# Patient Record
Sex: Female | Born: 1940 | Race: White | Hispanic: No | Smoking: Never smoker
Health system: Southern US, Community
[De-identification: ages and names within clinical notes are randomized; demographics above are authoritative.]

## PROBLEM LIST (undated history)

## (undated) DIAGNOSIS — D649 Anemia, unspecified: Secondary | ICD-10-CM

## (undated) DIAGNOSIS — Z8489 Family history of other specified conditions: Secondary | ICD-10-CM

## (undated) DIAGNOSIS — C9000 Multiple myeloma not having achieved remission: Secondary | ICD-10-CM

## (undated) DIAGNOSIS — R42 Dizziness and giddiness: Secondary | ICD-10-CM

## (undated) DIAGNOSIS — K2270 Barrett's esophagus without dysplasia: Secondary | ICD-10-CM

## (undated) DIAGNOSIS — T7840XA Allergy, unspecified, initial encounter: Secondary | ICD-10-CM

## (undated) DIAGNOSIS — M545 Low back pain, unspecified: Secondary | ICD-10-CM

## (undated) DIAGNOSIS — J309 Allergic rhinitis, unspecified: Secondary | ICD-10-CM

## (undated) DIAGNOSIS — K219 Gastro-esophageal reflux disease without esophagitis: Secondary | ICD-10-CM

## (undated) DIAGNOSIS — J302 Other seasonal allergic rhinitis: Secondary | ICD-10-CM

## (undated) DIAGNOSIS — D759 Disease of blood and blood-forming organs, unspecified: Secondary | ICD-10-CM

## (undated) DIAGNOSIS — N289 Disorder of kidney and ureter, unspecified: Secondary | ICD-10-CM

## (undated) DIAGNOSIS — M51369 Other intervertebral disc degeneration, lumbar region without mention of lumbar back pain or lower extremity pain: Secondary | ICD-10-CM

## (undated) DIAGNOSIS — M5416 Radiculopathy, lumbar region: Secondary | ICD-10-CM

## (undated) DIAGNOSIS — E039 Hypothyroidism, unspecified: Secondary | ICD-10-CM

## (undated) DIAGNOSIS — G629 Polyneuropathy, unspecified: Secondary | ICD-10-CM

## (undated) DIAGNOSIS — M8580 Other specified disorders of bone density and structure, unspecified site: Secondary | ICD-10-CM

## (undated) DIAGNOSIS — IMO0002 Reserved for concepts with insufficient information to code with codable children: Secondary | ICD-10-CM

## (undated) DIAGNOSIS — I499 Cardiac arrhythmia, unspecified: Secondary | ICD-10-CM

## (undated) DIAGNOSIS — C801 Malignant (primary) neoplasm, unspecified: Secondary | ICD-10-CM

## (undated) DIAGNOSIS — R194 Change in bowel habit: Secondary | ICD-10-CM

## (undated) DIAGNOSIS — M48062 Spinal stenosis, lumbar region with neurogenic claudication: Secondary | ICD-10-CM

## (undated) DIAGNOSIS — R7611 Nonspecific reaction to tuberculin skin test without active tuberculosis: Secondary | ICD-10-CM

## (undated) DIAGNOSIS — R1319 Other dysphagia: Secondary | ICD-10-CM

## (undated) DIAGNOSIS — M810 Age-related osteoporosis without current pathological fracture: Secondary | ICD-10-CM

## (undated) DIAGNOSIS — R002 Palpitations: Secondary | ICD-10-CM

## (undated) DIAGNOSIS — M109 Gout, unspecified: Secondary | ICD-10-CM

## (undated) DIAGNOSIS — M5136 Other intervertebral disc degeneration, lumbar region: Secondary | ICD-10-CM

## (undated) DIAGNOSIS — Z9481 Bone marrow transplant status: Secondary | ICD-10-CM

## (undated) DIAGNOSIS — M069 Rheumatoid arthritis, unspecified: Secondary | ICD-10-CM

## (undated) DIAGNOSIS — M064 Inflammatory polyarthropathy: Secondary | ICD-10-CM

## (undated) DIAGNOSIS — E785 Hyperlipidemia, unspecified: Secondary | ICD-10-CM

## (undated) HISTORY — DX: Bone marrow transplant status: Z94.81

## (undated) HISTORY — DX: Multiple myeloma not having achieved remission: C90.00

## (undated) HISTORY — DX: Allergy, unspecified, initial encounter: T78.40XA

## (undated) HISTORY — PX: FOOT SURGERY: SHX648

## (undated) HISTORY — DX: Disorder of kidney and ureter, unspecified: N28.9

## (undated) HISTORY — DX: Gout, unspecified: M10.9

## (undated) HISTORY — DX: Other intervertebral disc degeneration, lumbar region: M51.36

## (undated) HISTORY — DX: Palpitations: R00.2

## (undated) HISTORY — DX: Allergic rhinitis, unspecified: J30.9

## (undated) HISTORY — DX: Spinal stenosis, lumbar region with neurogenic claudication: M48.062

## (undated) HISTORY — DX: Other intervertebral disc degeneration, lumbar region without mention of lumbar back pain or lower extremity pain: M51.369

## (undated) HISTORY — PX: CHOLECYSTECTOMY: SHX55

## (undated) HISTORY — DX: Malignant (primary) neoplasm, unspecified: C80.1

## (undated) HISTORY — PX: ABDOMINAL HYSTERECTOMY: SHX81

## (undated) HISTORY — DX: Hyperlipidemia, unspecified: E78.5

## (undated) HISTORY — DX: Rheumatoid arthritis, unspecified: M06.9

## (undated) HISTORY — PX: CATARACT EXTRACTION: SUR2

## (undated) HISTORY — DX: Radiculopathy, lumbar region: M54.16

## (undated) HISTORY — PX: APPENDECTOMY: SHX54

## (undated) HISTORY — PX: BACK SURGERY: SHX140

## (undated) HISTORY — DX: Barrett's esophagus without dysplasia: K22.70

## (undated) HISTORY — PX: BREAST EXCISIONAL BIOPSY: SUR124

## (undated) HISTORY — DX: Age-related osteoporosis without current pathological fracture: M81.0

## (undated) HISTORY — DX: Reserved for concepts with insufficient information to code with codable children: IMO0002

---

## 2001-04-08 ENCOUNTER — Emergency Department (HOSPITAL_COMMUNITY): Admission: EM | Admit: 2001-04-08 | Discharge: 2001-04-09 | Payer: Self-pay | Admitting: Emergency Medicine

## 2001-04-08 ENCOUNTER — Encounter: Payer: Self-pay | Admitting: Emergency Medicine

## 2006-03-15 ENCOUNTER — Ambulatory Visit: Payer: Self-pay | Admitting: Family Medicine

## 2006-07-15 ENCOUNTER — Ambulatory Visit: Payer: Self-pay | Admitting: Unknown Physician Specialty

## 2007-04-11 ENCOUNTER — Ambulatory Visit: Payer: Self-pay | Admitting: Family Medicine

## 2008-06-21 ENCOUNTER — Ambulatory Visit: Payer: Self-pay | Admitting: Family Medicine

## 2009-01-24 ENCOUNTER — Ambulatory Visit: Payer: Self-pay | Admitting: Otolaryngology

## 2009-04-18 ENCOUNTER — Ambulatory Visit: Payer: Self-pay | Admitting: Sports Medicine

## 2009-05-11 DIAGNOSIS — A159 Respiratory tuberculosis unspecified: Secondary | ICD-10-CM

## 2009-05-11 HISTORY — DX: Respiratory tuberculosis unspecified: A15.9

## 2009-06-25 ENCOUNTER — Ambulatory Visit: Payer: Self-pay | Admitting: Family Medicine

## 2009-09-18 ENCOUNTER — Ambulatory Visit: Payer: Self-pay | Admitting: Family Medicine

## 2010-06-11 ENCOUNTER — Emergency Department: Payer: Self-pay | Admitting: Emergency Medicine

## 2010-08-05 ENCOUNTER — Ambulatory Visit: Payer: Self-pay | Admitting: Family Medicine

## 2010-08-10 ENCOUNTER — Ambulatory Visit: Payer: Self-pay | Admitting: Internal Medicine

## 2010-09-09 ENCOUNTER — Ambulatory Visit: Payer: Self-pay | Admitting: Internal Medicine

## 2010-10-10 ENCOUNTER — Ambulatory Visit: Payer: Self-pay | Admitting: Internal Medicine

## 2010-11-09 ENCOUNTER — Ambulatory Visit: Payer: Self-pay | Admitting: Internal Medicine

## 2010-12-10 ENCOUNTER — Ambulatory Visit: Payer: Self-pay | Admitting: Internal Medicine

## 2011-01-10 ENCOUNTER — Ambulatory Visit: Payer: Self-pay | Admitting: Internal Medicine

## 2011-02-09 ENCOUNTER — Ambulatory Visit: Payer: Self-pay | Admitting: Internal Medicine

## 2011-03-12 ENCOUNTER — Ambulatory Visit: Payer: Self-pay | Admitting: Internal Medicine

## 2011-04-11 ENCOUNTER — Ambulatory Visit: Payer: Self-pay | Admitting: Internal Medicine

## 2011-05-12 ENCOUNTER — Ambulatory Visit: Payer: Self-pay | Admitting: Internal Medicine

## 2011-05-12 DIAGNOSIS — IMO0002 Reserved for concepts with insufficient information to code with codable children: Secondary | ICD-10-CM

## 2011-05-12 HISTORY — DX: Reserved for concepts with insufficient information to code with codable children: IMO0002

## 2011-05-18 LAB — CBC CANCER CENTER
Basophil #: 0 x10 3/mm (ref 0.0–0.1)
Basophil %: 0.5 %
Eosinophil #: 0.4 x10 3/mm (ref 0.0–0.7)
Eosinophil %: 7.4 %
HCT: 33.9 % — ABNORMAL LOW (ref 35.0–47.0)
HGB: 11.5 g/dL — ABNORMAL LOW (ref 12.0–16.0)
Lymphocyte #: 1.4 x10 3/mm (ref 1.0–3.6)
Lymphocyte %: 22.6 %
MCH: 32 pg (ref 26.0–34.0)
MCHC: 33.9 g/dL (ref 32.0–36.0)
MCV: 94 fL (ref 80–100)
Monocyte #: 0.8 x10 3/mm — ABNORMAL HIGH (ref 0.0–0.7)
Monocyte %: 13.7 %
Neutrophil #: 3.4 x10 3/mm (ref 1.4–6.5)
Neutrophil %: 55.8 %
Platelet: 183 x10 3/mm (ref 150–440)
RBC: 3.59 10*6/uL — ABNORMAL LOW (ref 3.80–5.20)
RDW: 14.3 % (ref 11.5–14.5)
WBC: 6 x10 3/mm (ref 3.6–11.0)

## 2011-05-18 LAB — CREATININE, SERUM
Creatinine: 1.43 mg/dL — ABNORMAL HIGH (ref 0.60–1.30)
EGFR (African American): 47 — ABNORMAL LOW
EGFR (Non-African Amer.): 38 — ABNORMAL LOW

## 2011-05-25 LAB — CBC CANCER CENTER
Basophil #: 0 x10 3/mm (ref 0.0–0.1)
Basophil %: 1 %
Eosinophil #: 0.2 x10 3/mm (ref 0.0–0.7)
Eosinophil %: 4.8 %
HCT: 33.9 % — ABNORMAL LOW (ref 35.0–47.0)
HGB: 11.6 g/dL — ABNORMAL LOW (ref 12.0–16.0)
Lymphocyte #: 1.1 x10 3/mm (ref 1.0–3.6)
Lymphocyte %: 26.1 %
MCH: 32 pg (ref 26.0–34.0)
MCHC: 34.3 g/dL (ref 32.0–36.0)
MCV: 94 fL (ref 80–100)
Monocyte #: 0.6 x10 3/mm (ref 0.0–0.7)
Monocyte %: 14 %
Neutrophil #: 2.4 x10 3/mm (ref 1.4–6.5)
Neutrophil %: 54.1 %
Platelet: 220 x10 3/mm (ref 150–440)
RBC: 3.62 10*6/uL — ABNORMAL LOW (ref 3.80–5.20)
RDW: 14.2 % (ref 11.5–14.5)
WBC: 4.4 x10 3/mm (ref 3.6–11.0)

## 2011-06-08 LAB — CBC CANCER CENTER
Basophil #: 0.1 x10 3/mm (ref 0.0–0.1)
Basophil %: 1 %
Eosinophil #: 0.2 x10 3/mm (ref 0.0–0.7)
Eosinophil %: 3.8 %
HCT: 37 % (ref 35.0–47.0)
HGB: 12.7 g/dL (ref 12.0–16.0)
Lymphocyte #: 1.6 x10 3/mm (ref 1.0–3.6)
Lymphocyte %: 26.9 %
MCH: 32 pg (ref 26.0–34.0)
MCHC: 34.3 g/dL (ref 32.0–36.0)
MCV: 93 fL (ref 80–100)
Monocyte #: 0.5 x10 3/mm (ref 0.0–0.7)
Monocyte %: 8 %
Neutrophil #: 3.5 x10 3/mm (ref 1.4–6.5)
Neutrophil %: 60.3 %
Platelet: 221 x10 3/mm (ref 150–440)
RBC: 3.96 10*6/uL (ref 3.80–5.20)
RDW: 14.4 % (ref 11.5–14.5)
WBC: 5.8 x10 3/mm (ref 3.6–11.0)

## 2011-06-08 LAB — CREATININE, SERUM
Creatinine: 1.43 mg/dL — ABNORMAL HIGH (ref 0.60–1.30)
EGFR (African American): 47 — ABNORMAL LOW
EGFR (Non-African Amer.): 38 — ABNORMAL LOW

## 2011-06-08 LAB — HEPATIC FUNCTION PANEL A (ARMC)
Albumin: 3.6 g/dL (ref 3.4–5.0)
Alkaline Phosphatase: 90 U/L (ref 50–136)
Bilirubin, Direct: 0.1 mg/dL (ref 0.00–0.20)
Bilirubin,Total: 0.4 mg/dL (ref 0.2–1.0)
SGOT(AST): 16 U/L (ref 15–37)
SGPT (ALT): 14 U/L
Total Protein: 7.1 g/dL (ref 6.4–8.2)

## 2011-06-12 ENCOUNTER — Ambulatory Visit: Payer: Self-pay | Admitting: Internal Medicine

## 2011-06-22 LAB — CBC CANCER CENTER
Basophil #: 0 x10 3/mm (ref 0.0–0.1)
Basophil %: 0.9 %
Eosinophil #: 0.3 x10 3/mm (ref 0.0–0.7)
Eosinophil %: 5.8 %
HCT: 37.5 % (ref 35.0–47.0)
HGB: 12.7 g/dL (ref 12.0–16.0)
Lymphocyte #: 1.4 x10 3/mm (ref 1.0–3.6)
Lymphocyte %: 28.5 %
MCH: 31.6 pg (ref 26.0–34.0)
MCHC: 33.8 g/dL (ref 32.0–36.0)
MCV: 93 fL (ref 80–100)
Monocyte #: 0.7 x10 3/mm (ref 0.0–0.7)
Monocyte %: 14.8 %
Neutrophil #: 2.4 x10 3/mm (ref 1.4–6.5)
Neutrophil %: 50 %
Platelet: 179 x10 3/mm (ref 150–440)
RBC: 4.02 10*6/uL (ref 3.80–5.20)
RDW: 14.1 % (ref 11.5–14.5)
WBC: 4.8 x10 3/mm (ref 3.6–11.0)

## 2011-06-22 LAB — CREATININE, SERUM
Creatinine: 1.44 mg/dL — ABNORMAL HIGH (ref 0.60–1.30)
EGFR (African American): 46 — ABNORMAL LOW
EGFR (Non-African Amer.): 38 — ABNORMAL LOW

## 2011-06-23 LAB — URINE IEP, RANDOM

## 2011-06-24 LAB — KAPPA/LAMBDA FREE LIGHT CHAINS (ARMC)

## 2011-06-24 LAB — PROT IMMUNOELECTROPHORES(ARMC)

## 2011-07-06 LAB — CBC CANCER CENTER
Basophil #: 0 x10 3/mm (ref 0.0–0.1)
Basophil %: 0.5 %
Eosinophil #: 0.2 x10 3/mm (ref 0.0–0.7)
Eosinophil %: 2 %
HCT: 40.7 % (ref 35.0–47.0)
HGB: 13.6 g/dL (ref 12.0–16.0)
Lymphocyte #: 1.7 x10 3/mm (ref 1.0–3.6)
Lymphocyte %: 20.4 %
MCH: 31.3 pg (ref 26.0–34.0)
MCHC: 33.4 g/dL (ref 32.0–36.0)
MCV: 94 fL (ref 80–100)
Monocyte #: 0.6 x10 3/mm (ref 0.0–0.7)
Monocyte %: 7.1 %
Neutrophil #: 5.7 x10 3/mm (ref 1.4–6.5)
Neutrophil %: 70 %
Platelet: 207 x10 3/mm (ref 150–440)
RBC: 4.35 10*6/uL (ref 3.80–5.20)
RDW: 13.9 % (ref 11.5–14.5)
WBC: 8.1 x10 3/mm (ref 3.6–11.0)

## 2011-07-10 ENCOUNTER — Ambulatory Visit: Payer: Self-pay | Admitting: Internal Medicine

## 2011-07-27 LAB — CBC CANCER CENTER
Basophil #: 0 x10 3/mm (ref 0.0–0.1)
Basophil %: 0.7 %
Eosinophil #: 0 x10 3/mm (ref 0.0–0.7)
Eosinophil %: 0.5 %
HCT: 37.6 % (ref 35.0–47.0)
HGB: 12.7 g/dL (ref 12.0–16.0)
Lymphocyte #: 1.3 x10 3/mm (ref 1.0–3.6)
Lymphocyte %: 24.9 %
MCH: 31.2 pg (ref 26.0–34.0)
MCHC: 33.7 g/dL (ref 32.0–36.0)
MCV: 93 fL (ref 80–100)
Monocyte #: 0.5 x10 3/mm (ref 0.0–0.7)
Monocyte %: 9.4 %
Neutrophil #: 3.4 x10 3/mm (ref 1.4–6.5)
Neutrophil %: 64.5 %
Platelet: 216 x10 3/mm (ref 150–440)
RBC: 4.07 10*6/uL (ref 3.80–5.20)
RDW: 14.4 % (ref 11.5–14.5)
WBC: 5.2 x10 3/mm (ref 3.6–11.0)

## 2011-07-27 LAB — CREATININE, SERUM
Creatinine: 1.25 mg/dL
EGFR (African American): 54 — ABNORMAL LOW
EGFR (Non-African Amer.): 45 — ABNORMAL LOW

## 2011-08-10 ENCOUNTER — Ambulatory Visit: Payer: Self-pay | Admitting: Internal Medicine

## 2011-08-19 LAB — CBC CANCER CENTER
Bands: 3 %
Basophil #: 0 x10 3/mm (ref 0.0–0.1)
Basophil %: 0.4 %
Basophil: 1 %
Eosinophil #: 0.1 x10 3/mm (ref 0.0–0.7)
Eosinophil %: 0.9 %
Eosinophil: 1 %
HCT: 37.6 % (ref 35.0–47.0)
HGB: 12.7 g/dL (ref 12.0–16.0)
Lymphocyte #: 1.3 x10 3/mm (ref 1.0–3.6)
Lymphocyte %: 21.4 %
Lymphocytes: 18 %
MCH: 31.6 pg (ref 26.0–34.0)
MCHC: 33.9 g/dL (ref 32.0–36.0)
MCV: 93 fL (ref 80–100)
Monocyte #: 0.5 x10 3/mm (ref 0.2–0.9)
Monocyte %: 8.1 %
Monocytes: 9 %
Neutrophil #: 4.2 x10 3/mm (ref 1.4–6.5)
Neutrophil %: 69.2 %
Platelet: 178 x10 3/mm (ref 150–440)
RBC: 4.04 10*6/uL (ref 3.80–5.20)
RDW: 14.9 % — ABNORMAL HIGH (ref 11.5–14.5)
Segmented Neutrophils: 68 %
WBC: 6.1 x10 3/mm (ref 3.6–11.0)

## 2011-08-19 LAB — CREATININE, SERUM
Creatinine: 1.5 mg/dL — ABNORMAL HIGH (ref 0.60–1.30)
EGFR (African American): 44 — ABNORMAL LOW
EGFR (Non-African Amer.): 36 — ABNORMAL LOW

## 2011-08-20 LAB — PROT IMMUNOELECTROPHORES(ARMC)

## 2011-09-09 ENCOUNTER — Ambulatory Visit: Payer: Self-pay | Admitting: Internal Medicine

## 2011-09-23 DIAGNOSIS — T8600 Unspecified complication of bone marrow transplant: Secondary | ICD-10-CM | POA: Insufficient documentation

## 2011-09-23 DIAGNOSIS — M069 Rheumatoid arthritis, unspecified: Secondary | ICD-10-CM | POA: Insufficient documentation

## 2011-10-13 DIAGNOSIS — K2270 Barrett's esophagus without dysplasia: Secondary | ICD-10-CM | POA: Insufficient documentation

## 2011-10-13 DIAGNOSIS — M810 Age-related osteoporosis without current pathological fracture: Secondary | ICD-10-CM | POA: Insufficient documentation

## 2011-11-18 ENCOUNTER — Ambulatory Visit: Payer: Self-pay | Admitting: Internal Medicine

## 2011-11-18 LAB — CBC CANCER CENTER
Basophil #: 0.1 x10 3/mm (ref 0.0–0.1)
Basophil %: 1 %
Eosinophil #: 0 x10 3/mm (ref 0.0–0.7)
Eosinophil %: 0.2 %
HCT: 31.3 % — ABNORMAL LOW (ref 35.0–47.0)
HGB: 10.2 g/dL — ABNORMAL LOW (ref 12.0–16.0)
Lymphocyte #: 1.7 x10 3/mm (ref 1.0–3.6)
Lymphocyte %: 22 %
MCH: 31.9 pg (ref 26.0–34.0)
MCHC: 32.5 g/dL (ref 32.0–36.0)
MCV: 98 fL (ref 80–100)
Monocyte #: 0.8 x10 3/mm (ref 0.2–0.9)
Monocyte %: 9.7 %
Neutrophil #: 5.3 x10 3/mm (ref 1.4–6.5)
Neutrophil %: 67.1 %
Platelet: 175 x10 3/mm (ref 150–440)
RBC: 3.18 10*6/uL — ABNORMAL LOW (ref 3.80–5.20)
RDW: 17.9 % — ABNORMAL HIGH (ref 11.5–14.5)
WBC: 7.9 x10 3/mm (ref 3.6–11.0)

## 2011-11-18 LAB — CREATININE, SERUM
Creatinine: 1.18 mg/dL (ref 0.60–1.30)
EGFR (African American): 54 — ABNORMAL LOW
EGFR (Non-African Amer.): 46 — ABNORMAL LOW

## 2011-12-10 ENCOUNTER — Ambulatory Visit: Payer: Self-pay | Admitting: Internal Medicine

## 2011-12-16 LAB — CBC CANCER CENTER
Basophil #: 0.1 x10 3/mm (ref 0.0–0.1)
Basophil %: 0.8 %
Eosinophil #: 0 x10 3/mm (ref 0.0–0.7)
Eosinophil %: 0.5 %
HCT: 35 % (ref 35.0–47.0)
HGB: 11.8 g/dL — ABNORMAL LOW (ref 12.0–16.0)
Lymphocyte #: 1.3 x10 3/mm (ref 1.0–3.6)
Lymphocyte %: 18.5 %
MCH: 33.6 pg (ref 26.0–34.0)
MCHC: 33.7 g/dL (ref 32.0–36.0)
MCV: 100 fL (ref 80–100)
Monocyte #: 0.5 x10 3/mm (ref 0.2–0.9)
Monocyte %: 7.2 %
Neutrophil #: 5.3 x10 3/mm (ref 1.4–6.5)
Neutrophil %: 73 %
Platelet: 220 x10 3/mm (ref 150–440)
RBC: 3.51 10*6/uL — ABNORMAL LOW (ref 3.80–5.20)
RDW: 15.1 % — ABNORMAL HIGH (ref 11.5–14.5)
WBC: 7.2 x10 3/mm (ref 3.6–11.0)

## 2011-12-16 LAB — CREATININE, SERUM
Creatinine: 1.66 mg/dL — ABNORMAL HIGH (ref 0.60–1.30)
EGFR (African American): 36 — ABNORMAL LOW
EGFR (Non-African Amer.): 31 — ABNORMAL LOW

## 2011-12-16 LAB — CALCIUM: Calcium, Total: 8.8 mg/dL (ref 8.5–10.1)

## 2011-12-16 LAB — URIC ACID: Uric Acid: 7 mg/dL — ABNORMAL HIGH (ref 2.6–6.0)

## 2011-12-16 LAB — POTASSIUM: Potassium: 3.9 mmol/L (ref 3.5–5.1)

## 2011-12-18 LAB — KAPPA/LAMBDA FREE LIGHT CHAINS (ARMC)

## 2011-12-18 LAB — PROT IMMUNOELECTROPHORES(ARMC)

## 2011-12-23 LAB — CREATININE, SERUM
Creatinine: 1.65 mg/dL — ABNORMAL HIGH (ref 0.60–1.30)
EGFR (African American): 36 — ABNORMAL LOW
EGFR (Non-African Amer.): 31 — ABNORMAL LOW

## 2012-01-10 ENCOUNTER — Ambulatory Visit: Payer: Self-pay | Admitting: Internal Medicine

## 2012-01-13 LAB — CBC CANCER CENTER
Basophil #: 0 x10 3/mm (ref 0.0–0.1)
Basophil %: 0.5 %
Eosinophil #: 0 x10 3/mm (ref 0.0–0.7)
Eosinophil %: 0.4 %
HCT: 37.2 % (ref 35.0–47.0)
HGB: 12.2 g/dL (ref 12.0–16.0)
Lymphocyte #: 1.3 x10 3/mm (ref 1.0–3.6)
Lymphocyte %: 18.3 %
MCH: 32.5 pg (ref 26.0–34.0)
MCHC: 32.8 g/dL (ref 32.0–36.0)
MCV: 99 fL (ref 80–100)
Monocyte #: 0.5 x10 3/mm (ref 0.2–0.9)
Monocyte %: 7.6 %
Neutrophil #: 5.2 x10 3/mm (ref 1.4–6.5)
Neutrophil %: 73.2 %
Platelet: 185 x10 3/mm (ref 150–440)
RBC: 3.76 10*6/uL — ABNORMAL LOW (ref 3.80–5.20)
RDW: 13.3 % (ref 11.5–14.5)
WBC: 7.1 x10 3/mm (ref 3.6–11.0)

## 2012-01-13 LAB — CREATININE, SERUM
Creatinine: 1.47 mg/dL — ABNORMAL HIGH (ref 0.60–1.30)
EGFR (African American): 41 — ABNORMAL LOW
EGFR (Non-African Amer.): 36 — ABNORMAL LOW

## 2012-02-03 LAB — CBC CANCER CENTER
Basophil #: 0 x10 3/mm (ref 0.0–0.1)
Basophil %: 0.7 %
Eosinophil #: 0 x10 3/mm (ref 0.0–0.7)
Eosinophil %: 0.6 %
HCT: 37.9 % (ref 35.0–47.0)
HGB: 12.5 g/dL (ref 12.0–16.0)
Lymphocyte #: 1.3 x10 3/mm (ref 1.0–3.6)
Lymphocyte %: 19.4 %
MCH: 31.9 pg (ref 26.0–34.0)
MCHC: 33.1 g/dL (ref 32.0–36.0)
MCV: 96 fL (ref 80–100)
Monocyte #: 0.6 x10 3/mm (ref 0.2–0.9)
Monocyte %: 9.3 %
Neutrophil #: 4.6 x10 3/mm (ref 1.4–6.5)
Neutrophil %: 70 %
Platelet: 188 x10 3/mm (ref 150–440)
RBC: 3.93 10*6/uL (ref 3.80–5.20)
RDW: 13 % (ref 11.5–14.5)
WBC: 6.6 x10 3/mm (ref 3.6–11.0)

## 2012-02-03 LAB — CREATININE, SERUM
Creatinine: 1.56 mg/dL — ABNORMAL HIGH (ref 0.60–1.30)
EGFR (African American): 38 — ABNORMAL LOW
EGFR (Non-African Amer.): 33 — ABNORMAL LOW

## 2012-02-03 LAB — URIC ACID: Uric Acid: 9.2 mg/dL — ABNORMAL HIGH (ref 2.6–6.0)

## 2012-02-09 ENCOUNTER — Ambulatory Visit: Payer: Self-pay | Admitting: Internal Medicine

## 2012-02-09 ENCOUNTER — Ambulatory Visit: Payer: Self-pay

## 2012-03-02 ENCOUNTER — Ambulatory Visit: Payer: Self-pay | Admitting: Family Medicine

## 2012-03-21 ENCOUNTER — Ambulatory Visit: Payer: Self-pay | Admitting: Unknown Physician Specialty

## 2012-03-23 LAB — PATHOLOGY REPORT

## 2012-04-11 ENCOUNTER — Ambulatory Visit: Payer: Self-pay | Admitting: Internal Medicine

## 2012-04-11 LAB — CBC CANCER CENTER
Basophil #: 0 x10 3/mm (ref 0.0–0.1)
Basophil %: 0.8 %
Eosinophil #: 0.1 x10 3/mm (ref 0.0–0.7)
Eosinophil %: 1.4 %
HCT: 34.3 % — ABNORMAL LOW (ref 35.0–47.0)
HGB: 11.9 g/dL — ABNORMAL LOW (ref 12.0–16.0)
Lymphocyte #: 1.4 x10 3/mm (ref 1.0–3.6)
Lymphocyte %: 24.6 %
MCH: 32.6 pg (ref 26.0–34.0)
MCHC: 34.5 g/dL (ref 32.0–36.0)
MCV: 94 fL (ref 80–100)
Monocyte #: 0.5 x10 3/mm (ref 0.2–0.9)
Monocyte %: 8.4 %
Neutrophil #: 3.8 x10 3/mm (ref 1.4–6.5)
Neutrophil %: 64.8 %
Platelet: 192 x10 3/mm (ref 150–440)
RBC: 3.64 10*6/uL — ABNORMAL LOW (ref 3.80–5.20)
RDW: 13.3 % (ref 11.5–14.5)
WBC: 5.8 x10 3/mm (ref 3.6–11.0)

## 2012-04-11 LAB — CREATININE, SERUM
Creatinine: 1.61 mg/dL — ABNORMAL HIGH (ref 0.60–1.30)
EGFR (African American): 37 — ABNORMAL LOW
EGFR (Non-African Amer.): 32 — ABNORMAL LOW

## 2012-04-11 LAB — CALCIUM: Calcium, Total: 9.3 mg/dL (ref 8.5–10.1)

## 2012-04-12 LAB — KAPPA/LAMBDA FREE LIGHT CHAINS (ARMC)

## 2012-04-12 LAB — PROT IMMUNOELECTROPHORES(ARMC)

## 2012-05-09 LAB — CBC CANCER CENTER
Basophil #: 0.1 x10 3/mm (ref 0.0–0.1)
Basophil %: 0.4 %
Eosinophil #: 0 x10 3/mm (ref 0.0–0.7)
Eosinophil %: 0.1 %
HCT: 35 % (ref 35.0–47.0)
HGB: 12 g/dL (ref 12.0–16.0)
Lymphocyte #: 0.7 x10 3/mm — ABNORMAL LOW (ref 1.0–3.6)
Lymphocyte %: 5.4 %
MCH: 32.2 pg (ref 26.0–34.0)
MCHC: 34.2 g/dL (ref 32.0–36.0)
MCV: 94 fL (ref 80–100)
Monocyte #: 0.6 x10 3/mm (ref 0.2–0.9)
Monocyte %: 4.4 %
Neutrophil #: 12.4 x10 3/mm — ABNORMAL HIGH (ref 1.4–6.5)
Neutrophil %: 89.7 %
Platelet: 201 x10 3/mm (ref 150–440)
RBC: 3.72 10*6/uL — ABNORMAL LOW (ref 3.80–5.20)
RDW: 12.2 % (ref 11.5–14.5)
WBC: 13.9 x10 3/mm — ABNORMAL HIGH (ref 3.6–11.0)

## 2012-05-09 LAB — CREATININE, SERUM
Creatinine: 1.74 mg/dL — ABNORMAL HIGH (ref 0.60–1.30)
EGFR (African American): 34 — ABNORMAL LOW
EGFR (Non-African Amer.): 29 — ABNORMAL LOW

## 2012-05-09 LAB — URIC ACID: Uric Acid: 8.9 mg/dL — ABNORMAL HIGH (ref 2.6–6.0)

## 2012-05-09 LAB — CALCIUM: Calcium, Total: 9.3 mg/dL (ref 8.5–10.1)

## 2012-05-10 LAB — PROT IMMUNOELECTROPHORES(ARMC)

## 2012-05-11 ENCOUNTER — Ambulatory Visit: Payer: Self-pay | Admitting: Internal Medicine

## 2012-06-11 ENCOUNTER — Ambulatory Visit: Payer: Self-pay | Admitting: Internal Medicine

## 2012-07-04 LAB — CREATININE, SERUM
Creatinine: 1.84 mg/dL — ABNORMAL HIGH (ref 0.60–1.30)
EGFR (African American): 31 — ABNORMAL LOW
EGFR (Non-African Amer.): 27 — ABNORMAL LOW

## 2012-07-04 LAB — CBC CANCER CENTER
Basophil #: 0.1 x10 3/mm (ref 0.0–0.1)
Basophil %: 0.9 %
Eosinophil #: 0.1 x10 3/mm (ref 0.0–0.7)
Eosinophil %: 1.6 %
HCT: 38 % (ref 35.0–47.0)
HGB: 13 g/dL (ref 12.0–16.0)
Lymphocyte #: 1.5 x10 3/mm (ref 1.0–3.6)
Lymphocyte %: 26.4 %
MCH: 32.4 pg (ref 26.0–34.0)
MCHC: 34.3 g/dL (ref 32.0–36.0)
MCV: 95 fL (ref 80–100)
Monocyte #: 0.6 x10 3/mm (ref 0.2–0.9)
Monocyte %: 10.1 %
Neutrophil #: 3.4 x10 3/mm (ref 1.4–6.5)
Neutrophil %: 61 %
Platelet: 182 x10 3/mm (ref 150–440)
RBC: 4.02 10*6/uL (ref 3.80–5.20)
RDW: 13.5 % (ref 11.5–14.5)
WBC: 5.6 x10 3/mm (ref 3.6–11.0)

## 2012-07-04 LAB — CALCIUM: Calcium, Total: 9.3 mg/dL (ref 8.5–10.1)

## 2012-07-04 LAB — URIC ACID: Uric Acid: 9.5 mg/dL — ABNORMAL HIGH (ref 2.6–6.0)

## 2012-07-05 LAB — PROT IMMUNOELECTROPHORES(ARMC)

## 2012-07-07 LAB — BASIC METABOLIC PANEL
Anion Gap: 8 (ref 7–16)
BUN: 24 mg/dL — ABNORMAL HIGH (ref 7–18)
Calcium, Total: 9.1 mg/dL (ref 8.5–10.1)
Chloride: 107 mmol/L (ref 98–107)
Co2: 27 mmol/L (ref 21–32)
Creatinine: 1.54 mg/dL — ABNORMAL HIGH (ref 0.60–1.30)
EGFR (African American): 39 — ABNORMAL LOW
EGFR (Non-African Amer.): 33 — ABNORMAL LOW
Glucose: 92 mg/dL (ref 65–99)
Osmolality: 287 (ref 275–301)
Potassium: 4.4 mmol/L (ref 3.5–5.1)
Sodium: 142 mmol/L (ref 136–145)

## 2012-07-09 ENCOUNTER — Ambulatory Visit: Payer: Self-pay | Admitting: Internal Medicine

## 2012-08-01 LAB — CBC CANCER CENTER
Basophil #: 0 x10 3/mm (ref 0.0–0.1)
Basophil %: 0.8 %
Eosinophil #: 0.1 x10 3/mm (ref 0.0–0.7)
Eosinophil %: 2.2 %
HCT: 36.4 % (ref 35.0–47.0)
HGB: 12.8 g/dL (ref 12.0–16.0)
Lymphocyte #: 1.4 x10 3/mm (ref 1.0–3.6)
Lymphocyte %: 22.4 %
MCH: 32.7 pg (ref 26.0–34.0)
MCHC: 35.2 g/dL (ref 32.0–36.0)
MCV: 93 fL (ref 80–100)
Monocyte #: 0.5 x10 3/mm (ref 0.2–0.9)
Monocyte %: 8.6 %
Neutrophil #: 4.1 x10 3/mm (ref 1.4–6.5)
Neutrophil %: 66 %
Platelet: 198 x10 3/mm (ref 150–440)
RBC: 3.92 10*6/uL (ref 3.80–5.20)
RDW: 13.1 % (ref 11.5–14.5)
WBC: 6.2 x10 3/mm (ref 3.6–11.0)

## 2012-08-01 LAB — CREATININE, SERUM
Creatinine: 1.52 mg/dL — ABNORMAL HIGH (ref 0.60–1.30)
EGFR (African American): 39 — ABNORMAL LOW
EGFR (Non-African Amer.): 34 — ABNORMAL LOW

## 2012-08-01 LAB — URIC ACID: Uric Acid: 9.9 mg/dL — ABNORMAL HIGH (ref 2.6–6.0)

## 2012-08-01 LAB — CALCIUM: Calcium, Total: 8.6 mg/dL (ref 8.5–10.1)

## 2012-08-03 LAB — KAPPA/LAMBDA FREE LIGHT CHAINS (ARMC)

## 2012-08-03 LAB — PROT IMMUNOELECTROPHORES(ARMC)

## 2012-08-09 ENCOUNTER — Ambulatory Visit: Payer: Self-pay | Admitting: Internal Medicine

## 2012-09-20 ENCOUNTER — Ambulatory Visit: Payer: Self-pay | Admitting: Internal Medicine

## 2012-09-20 LAB — CBC CANCER CENTER
Basophil #: 0.1 x10 3/mm (ref 0.0–0.1)
Basophil %: 1 %
Eosinophil #: 0.1 x10 3/mm (ref 0.0–0.7)
Eosinophil %: 1.8 %
HCT: 37.3 % (ref 35.0–47.0)
HGB: 12.9 g/dL (ref 12.0–16.0)
Lymphocyte #: 1.6 x10 3/mm (ref 1.0–3.6)
Lymphocyte %: 29.7 %
MCH: 32.3 pg (ref 26.0–34.0)
MCHC: 34.5 g/dL (ref 32.0–36.0)
MCV: 94 fL (ref 80–100)
Monocyte #: 0.5 x10 3/mm (ref 0.2–0.9)
Monocyte %: 9.3 %
Neutrophil #: 3 x10 3/mm (ref 1.4–6.5)
Neutrophil %: 58.2 %
Platelet: 176 x10 3/mm (ref 150–440)
RBC: 3.98 10*6/uL (ref 3.80–5.20)
RDW: 12.2 % (ref 11.5–14.5)
WBC: 5.2 x10 3/mm (ref 3.6–11.0)

## 2012-09-20 LAB — CREATININE, SERUM
Creatinine: 2 mg/dL — ABNORMAL HIGH (ref 0.60–1.30)
EGFR (African American): 28 — ABNORMAL LOW
EGFR (Non-African Amer.): 24 — ABNORMAL LOW

## 2012-09-20 LAB — CALCIUM: Calcium, Total: 9.7 mg/dL (ref 8.5–10.1)

## 2012-09-20 LAB — URIC ACID: Uric Acid: 10.1 mg/dL — ABNORMAL HIGH (ref 2.6–6.0)

## 2012-09-22 LAB — PROT IMMUNOELECTROPHORES(ARMC)

## 2012-09-22 LAB — KAPPA/LAMBDA FREE LIGHT CHAINS (ARMC)

## 2012-10-05 LAB — CREATININE, SERUM
Creatinine: 1.71 mg/dL — ABNORMAL HIGH
EGFR (African American): 34 — ABNORMAL LOW
EGFR (Non-African Amer.): 29 — ABNORMAL LOW

## 2012-10-09 ENCOUNTER — Ambulatory Visit: Payer: Self-pay | Admitting: Internal Medicine

## 2012-11-17 ENCOUNTER — Ambulatory Visit: Payer: Self-pay | Admitting: Oncology

## 2012-11-21 LAB — CBC CANCER CENTER
Basophil #: 0.1 x10 3/mm (ref 0.0–0.1)
Basophil %: 1 %
Eosinophil #: 0.1 x10 3/mm (ref 0.0–0.7)
Eosinophil %: 1.6 %
HCT: 36.2 % (ref 35.0–47.0)
HGB: 12.8 g/dL (ref 12.0–16.0)
Lymphocyte #: 1.5 x10 3/mm (ref 1.0–3.6)
Lymphocyte %: 26.7 %
MCH: 32.7 pg (ref 26.0–34.0)
MCHC: 35.3 g/dL (ref 32.0–36.0)
MCV: 93 fL (ref 80–100)
Monocyte #: 0.4 x10 3/mm (ref 0.2–0.9)
Monocyte %: 7.1 %
Neutrophil #: 3.5 x10 3/mm (ref 1.4–6.5)
Neutrophil %: 63.6 %
Platelet: 175 x10 3/mm (ref 150–440)
RBC: 3.9 10*6/uL (ref 3.80–5.20)
RDW: 12.3 % (ref 11.5–14.5)
WBC: 5.5 x10 3/mm (ref 3.6–11.0)

## 2012-11-21 LAB — URIC ACID: Uric Acid: 8.6 mg/dL — ABNORMAL HIGH (ref 2.6–6.0)

## 2012-11-21 LAB — CREATININE, SERUM
Creatinine: 1.59 mg/dL — ABNORMAL HIGH (ref 0.60–1.30)
EGFR (African American): 37 — ABNORMAL LOW
EGFR (Non-African Amer.): 32 — ABNORMAL LOW

## 2012-11-21 LAB — CALCIUM: Calcium, Total: 9.2 mg/dL (ref 8.5–10.1)

## 2012-11-23 LAB — PROT IMMUNOELECTROPHORES(ARMC)

## 2012-11-23 LAB — KAPPA/LAMBDA FREE LIGHT CHAINS (ARMC)

## 2012-12-09 ENCOUNTER — Ambulatory Visit: Payer: Self-pay | Admitting: Oncology

## 2012-12-28 LAB — CREATININE, SERUM
Creatinine: 1.57 mg/dL — ABNORMAL HIGH (ref 0.60–1.30)
EGFR (African American): 38 — ABNORMAL LOW
EGFR (Non-African Amer.): 33 — ABNORMAL LOW

## 2013-01-11 ENCOUNTER — Ambulatory Visit: Payer: Self-pay | Admitting: Rheumatology

## 2013-01-12 ENCOUNTER — Ambulatory Visit: Payer: Self-pay | Admitting: Oncology

## 2013-03-01 ENCOUNTER — Ambulatory Visit: Payer: Self-pay | Admitting: Internal Medicine

## 2013-03-01 LAB — CBC CANCER CENTER
Basophil #: 0.1 x10 3/mm (ref 0.0–0.1)
Basophil %: 1.1 %
Eosinophil #: 0.1 x10 3/mm (ref 0.0–0.7)
Eosinophil %: 2.2 %
HCT: 37.8 % (ref 35.0–47.0)
HGB: 13.1 g/dL (ref 12.0–16.0)
Lymphocyte #: 1.3 x10 3/mm (ref 1.0–3.6)
Lymphocyte %: 23.6 %
MCH: 31.8 pg (ref 26.0–34.0)
MCHC: 34.7 g/dL (ref 32.0–36.0)
MCV: 92 fL (ref 80–100)
Monocyte #: 0.4 x10 3/mm (ref 0.2–0.9)
Monocyte %: 8.1 %
Neutrophil #: 3.6 x10 3/mm (ref 1.4–6.5)
Neutrophil %: 65 %
Platelet: 198 x10 3/mm (ref 150–440)
RBC: 4.13 10*6/uL (ref 3.80–5.20)
RDW: 12.8 % (ref 11.5–14.5)
WBC: 5.5 x10 3/mm (ref 3.6–11.0)

## 2013-03-01 LAB — CREATININE, SERUM
Creatinine: 1.51 mg/dL — ABNORMAL HIGH (ref 0.60–1.30)
EGFR (African American): 40 — ABNORMAL LOW
EGFR (Non-African Amer.): 34 — ABNORMAL LOW

## 2013-03-03 LAB — PROT IMMUNOELECTROPHORES(ARMC)

## 2013-03-03 LAB — KAPPA/LAMBDA FREE LIGHT CHAINS (ARMC)

## 2013-03-11 ENCOUNTER — Ambulatory Visit: Payer: Self-pay | Admitting: Internal Medicine

## 2013-05-10 ENCOUNTER — Encounter: Payer: Self-pay | Admitting: Rheumatology

## 2013-05-11 ENCOUNTER — Encounter: Payer: Self-pay | Admitting: Rheumatology

## 2013-05-31 ENCOUNTER — Ambulatory Visit: Payer: Self-pay | Admitting: Internal Medicine

## 2013-05-31 LAB — COMPREHENSIVE METABOLIC PANEL
Albumin: 3.7 g/dL (ref 3.4–5.0)
Alkaline Phosphatase: 77 U/L
Anion Gap: 9 (ref 7–16)
BUN: 26 mg/dL — ABNORMAL HIGH (ref 7–18)
Bilirubin,Total: 0.1 mg/dL — ABNORMAL LOW (ref 0.2–1.0)
Calcium, Total: 9.6 mg/dL (ref 8.5–10.1)
Chloride: 107 mmol/L (ref 98–107)
Co2: 25 mmol/L (ref 21–32)
Creatinine: 1.71 mg/dL — ABNORMAL HIGH (ref 0.60–1.30)
EGFR (African American): 34 — ABNORMAL LOW
EGFR (Non-African Amer.): 29 — ABNORMAL LOW
Glucose: 93 mg/dL (ref 65–99)
Osmolality: 286 (ref 275–301)
Potassium: 4.2 mmol/L (ref 3.5–5.1)
SGOT(AST): 20 U/L (ref 15–37)
SGPT (ALT): 12 U/L (ref 12–78)
Sodium: 141 mmol/L (ref 136–145)
Total Protein: 7.4 g/dL (ref 6.4–8.2)

## 2013-05-31 LAB — CBC CANCER CENTER
Basophil #: 0.1 x10 3/mm (ref 0.0–0.1)
Basophil %: 0.9 %
Eosinophil #: 0.2 x10 3/mm (ref 0.0–0.7)
Eosinophil %: 1.9 %
HCT: 38 % (ref 35.0–47.0)
HGB: 12.5 g/dL (ref 12.0–16.0)
Lymphocyte #: 1.8 x10 3/mm (ref 1.0–3.6)
Lymphocyte %: 18.9 %
MCH: 30.8 pg (ref 26.0–34.0)
MCHC: 32.9 g/dL (ref 32.0–36.0)
MCV: 94 fL (ref 80–100)
Monocyte #: 0.8 x10 3/mm (ref 0.2–0.9)
Monocyte %: 8.3 %
Neutrophil #: 6.6 x10 3/mm — ABNORMAL HIGH (ref 1.4–6.5)
Neutrophil %: 70 %
Platelet: 206 x10 3/mm (ref 150–440)
RBC: 4.07 10*6/uL (ref 3.80–5.20)
RDW: 13.2 % (ref 11.5–14.5)
WBC: 9.4 x10 3/mm (ref 3.6–11.0)

## 2013-06-02 LAB — PROT IMMUNOELECTROPHORES(ARMC)

## 2013-06-02 LAB — KAPPA/LAMBDA FREE LIGHT CHAINS (ARMC)

## 2013-06-11 ENCOUNTER — Ambulatory Visit: Payer: Self-pay | Admitting: Internal Medicine

## 2013-06-14 LAB — CREATININE, SERUM
Creatinine: 1.41 mg/dL — ABNORMAL HIGH (ref 0.60–1.30)
EGFR (African American): 43 — ABNORMAL LOW
EGFR (Non-African Amer.): 37 — ABNORMAL LOW

## 2013-07-09 ENCOUNTER — Ambulatory Visit: Payer: Self-pay | Admitting: Internal Medicine

## 2013-07-12 LAB — CREATININE, SERUM
Creatinine: 1.82 mg/dL — ABNORMAL HIGH (ref 0.60–1.30)
EGFR (African American): 31 — ABNORMAL LOW
EGFR (Non-African Amer.): 27 — ABNORMAL LOW

## 2013-07-13 LAB — KAPPA/LAMBDA FREE LIGHT CHAINS (ARMC)

## 2013-07-17 LAB — CREATININE, SERUM
Creatinine: 1.86 mg/dL — ABNORMAL HIGH (ref 0.60–1.30)
EGFR (African American): 31 — ABNORMAL LOW
EGFR (Non-African Amer.): 26 — ABNORMAL LOW

## 2013-07-18 LAB — KAPPA/LAMBDA FREE LIGHT CHAINS (ARMC)

## 2013-08-09 ENCOUNTER — Ambulatory Visit: Payer: Self-pay | Admitting: Internal Medicine

## 2013-08-16 LAB — CBC CANCER CENTER
Basophil #: 0 x10 3/mm (ref 0.0–0.1)
Basophil %: 0.8 %
Eosinophil #: 0.1 x10 3/mm (ref 0.0–0.7)
Eosinophil %: 1.4 %
HCT: 38.6 % (ref 35.0–47.0)
HGB: 12.6 g/dL (ref 12.0–16.0)
Lymphocyte #: 1.3 x10 3/mm (ref 1.0–3.6)
Lymphocyte %: 20.6 %
MCH: 30.7 pg (ref 26.0–34.0)
MCHC: 32.6 g/dL (ref 32.0–36.0)
MCV: 94 fL (ref 80–100)
Monocyte #: 0.4 x10 3/mm (ref 0.2–0.9)
Monocyte %: 5.8 %
Neutrophil #: 4.4 x10 3/mm (ref 1.4–6.5)
Neutrophil %: 71.4 %
Platelet: 186 x10 3/mm (ref 150–440)
RBC: 4.09 10*6/uL (ref 3.80–5.20)
RDW: 14.1 % (ref 11.5–14.5)
WBC: 6.1 x10 3/mm (ref 3.6–11.0)

## 2013-08-16 LAB — COMPREHENSIVE METABOLIC PANEL
Albumin: 3.9 g/dL (ref 3.4–5.0)
Alkaline Phosphatase: 61 U/L
Anion Gap: 9 (ref 7–16)
BUN: 21 mg/dL — ABNORMAL HIGH (ref 7–18)
Bilirubin,Total: 0.3 mg/dL (ref 0.2–1.0)
Calcium, Total: 8.8 mg/dL (ref 8.5–10.1)
Chloride: 106 mmol/L (ref 98–107)
Co2: 27 mmol/L (ref 21–32)
Creatinine: 1.62 mg/dL — ABNORMAL HIGH (ref 0.60–1.30)
EGFR (African American): 36 — ABNORMAL LOW
EGFR (Non-African Amer.): 31 — ABNORMAL LOW
Glucose: 99 mg/dL (ref 65–99)
Osmolality: 286 (ref 275–301)
Potassium: 4.5 mmol/L (ref 3.5–5.1)
SGOT(AST): 27 U/L (ref 15–37)
SGPT (ALT): 15 U/L (ref 12–78)
Sodium: 142 mmol/L (ref 136–145)
Total Protein: 7.4 g/dL (ref 6.4–8.2)

## 2013-08-18 LAB — PROT IMMUNOELECTROPHORES(ARMC)

## 2013-08-18 LAB — KAPPA/LAMBDA FREE LIGHT CHAINS (ARMC)

## 2013-09-08 ENCOUNTER — Ambulatory Visit: Payer: Self-pay | Admitting: Internal Medicine

## 2013-09-21 DIAGNOSIS — N289 Disorder of kidney and ureter, unspecified: Secondary | ICD-10-CM | POA: Insufficient documentation

## 2013-09-21 DIAGNOSIS — R7611 Nonspecific reaction to tuberculin skin test without active tuberculosis: Secondary | ICD-10-CM | POA: Insufficient documentation

## 2013-09-21 DIAGNOSIS — M109 Gout, unspecified: Secondary | ICD-10-CM | POA: Insufficient documentation

## 2013-09-27 ENCOUNTER — Ambulatory Visit: Payer: Self-pay | Admitting: Internal Medicine

## 2013-09-27 DIAGNOSIS — E785 Hyperlipidemia, unspecified: Secondary | ICD-10-CM | POA: Insufficient documentation

## 2013-09-27 DIAGNOSIS — R002 Palpitations: Secondary | ICD-10-CM | POA: Insufficient documentation

## 2013-09-27 DIAGNOSIS — J309 Allergic rhinitis, unspecified: Secondary | ICD-10-CM | POA: Insufficient documentation

## 2013-09-27 LAB — CBC CANCER CENTER
Basophil #: 0.1 x10 3/mm (ref 0.0–0.1)
Basophil %: 0.5 %
Eosinophil #: 0 x10 3/mm (ref 0.0–0.7)
Eosinophil %: 0.1 %
HCT: 37.7 % (ref 35.0–47.0)
HGB: 12.8 g/dL (ref 12.0–16.0)
Lymphocyte #: 2.7 x10 3/mm (ref 1.0–3.6)
Lymphocyte %: 24.2 %
MCH: 30.9 pg (ref 26.0–34.0)
MCHC: 33.8 g/dL (ref 32.0–36.0)
MCV: 91 fL (ref 80–100)
Monocyte #: 0.6 x10 3/mm (ref 0.2–0.9)
Monocyte %: 5.7 %
Neutrophil #: 7.7 x10 3/mm — ABNORMAL HIGH (ref 1.4–6.5)
Neutrophil %: 69.5 %
Platelet: 194 x10 3/mm (ref 150–440)
RBC: 4.13 10*6/uL (ref 3.80–5.20)
RDW: 13.6 % (ref 11.5–14.5)
WBC: 11 x10 3/mm (ref 3.6–11.0)

## 2013-09-27 LAB — CREATININE, SERUM
Creatinine: 1.49 mg/dL — ABNORMAL HIGH (ref 0.60–1.30)
EGFR (African American): 40 — ABNORMAL LOW
EGFR (Non-African Amer.): 34 — ABNORMAL LOW

## 2013-09-27 LAB — CALCIUM: Calcium, Total: 9.4 mg/dL (ref 8.5–10.1)

## 2013-09-29 LAB — KAPPA/LAMBDA FREE LIGHT CHAINS (ARMC)

## 2013-09-29 LAB — PROT IMMUNOELECTROPHORES(ARMC)

## 2013-10-06 ENCOUNTER — Ambulatory Visit: Payer: Self-pay | Admitting: Rheumatology

## 2013-10-09 ENCOUNTER — Ambulatory Visit: Payer: Self-pay | Admitting: Internal Medicine

## 2013-10-18 DIAGNOSIS — M5136 Other intervertebral disc degeneration, lumbar region: Secondary | ICD-10-CM | POA: Insufficient documentation

## 2013-10-18 DIAGNOSIS — M47816 Spondylosis without myelopathy or radiculopathy, lumbar region: Secondary | ICD-10-CM | POA: Insufficient documentation

## 2013-10-18 DIAGNOSIS — M5116 Intervertebral disc disorders with radiculopathy, lumbar region: Secondary | ICD-10-CM | POA: Insufficient documentation

## 2013-11-08 ENCOUNTER — Ambulatory Visit: Payer: Self-pay | Admitting: Internal Medicine

## 2013-11-08 LAB — CREATININE, SERUM
Creatinine: 1.72 mg/dL — ABNORMAL HIGH (ref 0.60–1.30)
EGFR (African American): 34 — ABNORMAL LOW
EGFR (Non-African Amer.): 29 — ABNORMAL LOW

## 2013-11-08 LAB — CBC CANCER CENTER
Basophil #: 0.1 "x10 3/mm "
Basophil %: 1.1 %
Eosinophil #: 0.1 "x10 3/mm "
Eosinophil %: 0.8 %
HCT: 38.4 %
HGB: 12.8 g/dL
Lymphocyte %: 24.6 %
Lymphs Abs: 1.8 "x10 3/mm "
MCH: 31.7 pg
MCHC: 33.3 g/dL
MCV: 95 fL
Monocyte #: 0.6 "x10 3/mm "
Monocyte %: 8.4 %
Neutrophil #: 4.8 "x10 3/mm "
Neutrophil %: 65.1 %
Platelet: 212 "x10 3/mm "
RBC: 4.04 "x10 6/mm "
RDW: 13.8 %
WBC: 7.4 "x10 3/mm "

## 2013-11-08 LAB — CALCIUM: Calcium, Total: 9.3 mg/dL

## 2013-11-08 LAB — URIC ACID: Uric Acid: 5.8 mg/dL (ref 2.6–6.0)

## 2013-11-14 LAB — PROT IMMUNOELECTROPHORES(ARMC)

## 2013-11-14 LAB — KAPPA/LAMBDA FREE LIGHT CHAINS (ARMC)

## 2013-12-09 ENCOUNTER — Ambulatory Visit: Payer: Self-pay | Admitting: Internal Medicine

## 2013-12-20 LAB — CBC CANCER CENTER
Basophil #: 0.1 x10 3/mm (ref 0.0–0.1)
Basophil %: 0.9 %
Eosinophil #: 0.1 x10 3/mm (ref 0.0–0.7)
Eosinophil %: 1.6 %
HCT: 38.6 % (ref 35.0–47.0)
HGB: 12.6 g/dL (ref 12.0–16.0)
Lymphocyte #: 1.4 x10 3/mm (ref 1.0–3.6)
Lymphocyte %: 22.8 %
MCH: 31.3 pg (ref 26.0–34.0)
MCHC: 32.6 g/dL (ref 32.0–36.0)
MCV: 96 fL (ref 80–100)
Monocyte #: 0.4 x10 3/mm (ref 0.2–0.9)
Monocyte %: 7.5 %
Neutrophil #: 4 x10 3/mm (ref 1.4–6.5)
Neutrophil %: 67.2 %
Platelet: 191 x10 3/mm (ref 150–440)
RBC: 4.03 10*6/uL (ref 3.80–5.20)
RDW: 13.5 % (ref 11.5–14.5)
WBC: 5.9 x10 3/mm (ref 3.6–11.0)

## 2013-12-20 LAB — CREATININE, SERUM
Creatinine: 1.52 mg/dL — ABNORMAL HIGH (ref 0.60–1.30)
EGFR (African American): 39 — ABNORMAL LOW
EGFR (Non-African Amer.): 34 — ABNORMAL LOW

## 2013-12-22 LAB — KAPPA/LAMBDA FREE LIGHT CHAINS (ARMC)

## 2013-12-22 LAB — PROT IMMUNOELECTROPHORES(ARMC)

## 2014-01-09 ENCOUNTER — Ambulatory Visit: Payer: Self-pay | Admitting: Internal Medicine

## 2014-01-31 LAB — CBC CANCER CENTER
Basophil #: 0.1 x10 3/mm (ref 0.0–0.1)
Basophil %: 1 %
Eosinophil #: 0.2 x10 3/mm (ref 0.0–0.7)
Eosinophil %: 3.4 %
HCT: 39.4 % (ref 35.0–47.0)
HGB: 12.8 g/dL (ref 12.0–16.0)
Lymphocyte #: 1.6 x10 3/mm (ref 1.0–3.6)
Lymphocyte %: 24.5 %
MCH: 31.5 pg (ref 26.0–34.0)
MCHC: 32.5 g/dL (ref 32.0–36.0)
MCV: 97 fL (ref 80–100)
Monocyte #: 0.6 x10 3/mm (ref 0.2–0.9)
Monocyte %: 9.1 %
Neutrophil #: 4 x10 3/mm (ref 1.4–6.5)
Neutrophil %: 62 %
Platelet: 178 x10 3/mm (ref 150–440)
RBC: 4.07 10*6/uL (ref 3.80–5.20)
RDW: 13.6 % (ref 11.5–14.5)
WBC: 6.5 x10 3/mm (ref 3.6–11.0)

## 2014-01-31 LAB — CREATININE, SERUM
Creatinine: 1.72 mg/dL — ABNORMAL HIGH (ref 0.60–1.30)
EGFR (African American): 34 — ABNORMAL LOW
EGFR (Non-African Amer.): 29 — ABNORMAL LOW

## 2014-01-31 LAB — URIC ACID: Uric Acid: 5.8 mg/dL (ref 2.6–6.0)

## 2014-02-05 LAB — KAPPA/LAMBDA FREE LIGHT CHAINS (ARMC)

## 2014-02-05 LAB — PROT IMMUNOELECTROPHORES(ARMC)

## 2014-02-08 ENCOUNTER — Ambulatory Visit: Payer: Self-pay | Admitting: Internal Medicine

## 2014-03-14 ENCOUNTER — Ambulatory Visit: Payer: Self-pay | Admitting: Internal Medicine

## 2014-03-14 LAB — CBC CANCER CENTER
Basophil #: 0.1 x10 3/mm (ref 0.0–0.1)
Basophil %: 1.6 %
Eosinophil #: 0.3 x10 3/mm (ref 0.0–0.7)
Eosinophil %: 4.2 %
HCT: 38.5 % (ref 35.0–47.0)
HGB: 12.5 g/dL (ref 12.0–16.0)
Lymphocyte #: 1.7 x10 3/mm (ref 1.0–3.6)
Lymphocyte %: 24.4 %
MCH: 31.3 pg (ref 26.0–34.0)
MCHC: 32.6 g/dL (ref 32.0–36.0)
MCV: 96 fL (ref 80–100)
Monocyte #: 0.5 x10 3/mm (ref 0.2–0.9)
Monocyte %: 7.9 %
Neutrophil #: 4.3 x10 3/mm (ref 1.4–6.5)
Neutrophil %: 61.9 %
Platelet: 214 x10 3/mm (ref 150–440)
RBC: 4 10*6/uL (ref 3.80–5.20)
RDW: 13.7 % (ref 11.5–14.5)
WBC: 6.9 x10 3/mm (ref 3.6–11.0)

## 2014-03-14 LAB — CALCIUM: Calcium, Total: 9.4 mg/dL (ref 8.5–10.1)

## 2014-03-14 LAB — CREATININE, SERUM
Creatinine: 1.52 mg/dL — ABNORMAL HIGH (ref 0.60–1.30)
EGFR (African American): 43 — ABNORMAL LOW
EGFR (Non-African Amer.): 36 — ABNORMAL LOW

## 2014-03-15 LAB — PROT IMMUNOELECTROPHORES(ARMC)

## 2014-03-15 LAB — KAPPA/LAMBDA FREE LIGHT CHAINS (ARMC)

## 2014-04-10 ENCOUNTER — Ambulatory Visit: Payer: Self-pay | Admitting: Internal Medicine

## 2014-05-18 ENCOUNTER — Ambulatory Visit: Payer: Self-pay | Admitting: Internal Medicine

## 2014-05-18 LAB — CBC CANCER CENTER
Basophil #: 0.1 x10 3/mm (ref 0.0–0.1)
Basophil %: 1 %
Eosinophil #: 0.2 x10 3/mm (ref 0.0–0.7)
Eosinophil %: 4.2 %
HCT: 36.3 % (ref 35.0–47.0)
HGB: 11.9 g/dL — ABNORMAL LOW (ref 12.0–16.0)
Lymphocyte #: 1.5 x10 3/mm (ref 1.0–3.6)
Lymphocyte %: 26.1 %
MCH: 30.5 pg (ref 26.0–34.0)
MCHC: 32.9 g/dL (ref 32.0–36.0)
MCV: 93 fL (ref 80–100)
Monocyte #: 0.5 x10 3/mm (ref 0.2–0.9)
Monocyte %: 8 %
Neutrophil #: 3.6 x10 3/mm (ref 1.4–6.5)
Neutrophil %: 60.7 %
Platelet: 189 x10 3/mm (ref 150–440)
RBC: 3.91 10*6/uL (ref 3.80–5.20)
RDW: 13.8 % (ref 11.5–14.5)
WBC: 5.9 x10 3/mm (ref 3.6–11.0)

## 2014-05-18 LAB — CREATININE, SERUM
Creatinine: 1.52 mg/dL — ABNORMAL HIGH (ref 0.60–1.30)
EGFR (African American): 43 — ABNORMAL LOW
EGFR (Non-African Amer.): 36 — ABNORMAL LOW

## 2014-05-18 LAB — CALCIUM: Calcium, Total: 8.2 mg/dL — ABNORMAL LOW (ref 8.5–10.1)

## 2014-05-22 LAB — PROT IMMUNOELECTROPHORES(ARMC)

## 2014-05-22 LAB — KAPPA/LAMBDA FREE LIGHT CHAINS (ARMC)

## 2014-06-04 LAB — CBC CANCER CENTER
Basophil #: 0.1 x10 3/mm (ref 0.0–0.1)
Basophil %: 0.6 %
Eosinophil #: 0.2 x10 3/mm (ref 0.0–0.7)
Eosinophil %: 1.8 %
HCT: 36.9 % (ref 35.0–47.0)
HGB: 12.1 g/dL (ref 12.0–16.0)
Lymphocyte #: 1.7 x10 3/mm (ref 1.0–3.6)
Lymphocyte %: 19.3 %
MCH: 30.3 pg (ref 26.0–34.0)
MCHC: 32.7 g/dL (ref 32.0–36.0)
MCV: 93 fL (ref 80–100)
Monocyte #: 0.6 x10 3/mm (ref 0.2–0.9)
Monocyte %: 6.8 %
Neutrophil #: 6.1 x10 3/mm (ref 1.4–6.5)
Neutrophil %: 71.5 %
Platelet: 203 x10 3/mm (ref 150–440)
RBC: 3.98 10*6/uL (ref 3.80–5.20)
RDW: 14 % (ref 11.5–14.5)
WBC: 8.6 x10 3/mm (ref 3.6–11.0)

## 2014-06-04 LAB — IRON AND TIBC
Iron Bind.Cap.(Total): 315 ug/dL (ref 250–450)
Iron Saturation: 23 %
Iron: 71 ug/dL (ref 50–170)
Unbound Iron-Bind.Cap.: 244 ug/dL

## 2014-06-04 LAB — FERRITIN: Ferritin (ARMC): 27 ng/mL (ref 8–388)

## 2014-06-11 ENCOUNTER — Ambulatory Visit: Payer: Self-pay | Admitting: Internal Medicine

## 2014-06-15 LAB — CBC CANCER CENTER
Basophil #: 0 x10 3/mm (ref 0.0–0.1)
Basophil %: 0.6 %
Eosinophil #: 0.2 x10 3/mm (ref 0.0–0.7)
Eosinophil %: 3.2 %
HCT: 37.1 % (ref 35.0–47.0)
HGB: 12.1 g/dL (ref 12.0–16.0)
Lymphocyte #: 1.8 x10 3/mm (ref 1.0–3.6)
Lymphocyte %: 24.8 %
MCH: 30.7 pg (ref 26.0–34.0)
MCHC: 32.7 g/dL (ref 32.0–36.0)
MCV: 94 fL (ref 80–100)
Monocyte #: 0.5 x10 3/mm (ref 0.2–0.9)
Monocyte %: 7.4 %
Neutrophil #: 4.7 x10 3/mm (ref 1.4–6.5)
Neutrophil %: 64 %
Platelet: 213 x10 3/mm (ref 150–440)
RBC: 3.96 10*6/uL (ref 3.80–5.20)
RDW: 14.1 % (ref 11.5–14.5)
WBC: 7.4 x10 3/mm (ref 3.6–11.0)

## 2014-06-15 LAB — CREATININE, SERUM
Creatinine: 1.67 mg/dL — ABNORMAL HIGH (ref 0.60–1.30)
EGFR (African American): 39 — ABNORMAL LOW
EGFR (Non-African Amer.): 32 — ABNORMAL LOW

## 2014-06-15 LAB — CALCIUM: Calcium, Total: 8.5 mg/dL (ref 8.5–10.1)

## 2014-06-18 LAB — KAPPA/LAMBDA FREE LIGHT CHAINS (ARMC)

## 2014-06-18 LAB — PROT IMMUNOELECTROPHORES(ARMC)

## 2014-07-10 ENCOUNTER — Ambulatory Visit
Admit: 2014-07-10 | Disposition: A | Discharge status: Home / Self Care | Payer: Self-pay | Attending: Internal Medicine | Admitting: Internal Medicine

## 2014-08-03 LAB — CBC CANCER CENTER
Basophil #: 0.1 x10 3/mm (ref 0.0–0.1)
Basophil %: 0.8 %
Eosinophil #: 0.1 x10 3/mm (ref 0.0–0.7)
Eosinophil %: 1.4 %
HCT: 36.3 % (ref 35.0–47.0)
HGB: 12 g/dL (ref 12.0–16.0)
Lymphocyte #: 1.5 x10 3/mm (ref 1.0–3.6)
Lymphocyte %: 21.6 %
MCH: 30.6 pg (ref 26.0–34.0)
MCHC: 33 g/dL (ref 32.0–36.0)
MCV: 93 fL (ref 80–100)
Monocyte #: 0.4 x10 3/mm (ref 0.2–0.9)
Monocyte %: 5.5 %
Neutrophil #: 4.8 x10 3/mm (ref 1.4–6.5)
Neutrophil %: 70.7 %
Platelet: 166 x10 3/mm (ref 150–440)
RBC: 3.9 10*6/uL (ref 3.80–5.20)
RDW: 14.2 % (ref 11.5–14.5)
WBC: 6.8 x10 3/mm (ref 3.6–11.0)

## 2014-08-03 LAB — CREATININE, SERUM
Creatinine: 1.45 mg/dL — ABNORMAL HIGH
EGFR (African American): 41 — ABNORMAL LOW
EGFR (Non-African Amer.): 35 — ABNORMAL LOW

## 2014-08-03 LAB — CALCIUM: Calcium, Total: 9.1 mg/dL

## 2014-08-06 LAB — KAPPA/LAMBDA FREE LIGHT CHAINS (ARMC)

## 2014-08-06 LAB — PROT IMMUNOELECTROPHORES(ARMC)

## 2014-08-10 ENCOUNTER — Ambulatory Visit
Admit: 2014-08-10 | Disposition: A | Discharge status: Home / Self Care | Payer: Self-pay | Attending: Internal Medicine | Admitting: Internal Medicine

## 2014-08-30 ENCOUNTER — Ambulatory Visit
Admit: 2014-08-30 | Disposition: A | Discharge status: Home / Self Care | Payer: Self-pay | Attending: Family Medicine | Admitting: Family Medicine

## 2014-09-07 LAB — CBC CANCER CENTER
Basophil #: 0 x10 3/mm (ref 0.0–0.1)
Basophil %: 0.5 %
Eosinophil #: 0.2 x10 3/mm (ref 0.0–0.7)
Eosinophil %: 2 %
HCT: 38.1 % (ref 35.0–47.0)
HGB: 12.5 g/dL (ref 12.0–16.0)
Lymphocyte #: 1 x10 3/mm (ref 1.0–3.6)
Lymphocyte %: 11.6 %
MCH: 30.7 pg (ref 26.0–34.0)
MCHC: 32.9 g/dL (ref 32.0–36.0)
MCV: 94 fL (ref 80–100)
Monocyte #: 0.5 x10 3/mm (ref 0.2–0.9)
Monocyte %: 6 %
Neutrophil #: 7.2 x10 3/mm — ABNORMAL HIGH (ref 1.4–6.5)
Neutrophil %: 79.9 %
Platelet: 159 x10 3/mm (ref 150–440)
RBC: 4.07 10*6/uL (ref 3.80–5.20)
RDW: 13.8 % (ref 11.5–14.5)
WBC: 9 x10 3/mm (ref 3.6–11.0)

## 2014-09-07 LAB — CREATININE, SERUM
Creatinine: 1.42 mg/dL — ABNORMAL HIGH
EGFR (African American): 42 — ABNORMAL LOW
EGFR (Non-African Amer.): 36 — ABNORMAL LOW

## 2014-09-07 LAB — CALCIUM: Calcium, Total: 9.3 mg/dL

## 2014-09-11 LAB — PROT IMMUNOELECTROPHORES(ARMC)

## 2014-09-11 LAB — KAPPA/LAMBDA FREE LIGHT CHAINS (ARMC)

## 2014-11-01 ENCOUNTER — Other Ambulatory Visit: Payer: Self-pay

## 2014-11-01 DIAGNOSIS — C9000 Multiple myeloma not having achieved remission: Secondary | ICD-10-CM

## 2014-11-02 ENCOUNTER — Inpatient Hospital Stay: Payer: Medicare Other | Attending: Family Medicine

## 2014-11-02 ENCOUNTER — Inpatient Hospital Stay (HOSPITAL_BASED_OUTPATIENT_CLINIC_OR_DEPARTMENT_OTHER): Payer: Medicare Other | Admitting: Family Medicine

## 2014-11-02 VITALS — BP 144/76 | HR 67 | Temp 97.1°F | Wt 175.0 lb

## 2014-11-02 DIAGNOSIS — M549 Dorsalgia, unspecified: Secondary | ICD-10-CM

## 2014-11-02 DIAGNOSIS — D509 Iron deficiency anemia, unspecified: Secondary | ICD-10-CM

## 2014-11-02 DIAGNOSIS — N289 Disorder of kidney and ureter, unspecified: Secondary | ICD-10-CM

## 2014-11-02 DIAGNOSIS — M1990 Unspecified osteoarthritis, unspecified site: Secondary | ICD-10-CM

## 2014-11-02 DIAGNOSIS — C9000 Multiple myeloma not having achieved remission: Secondary | ICD-10-CM

## 2014-11-02 DIAGNOSIS — Z79899 Other long term (current) drug therapy: Secondary | ICD-10-CM

## 2014-11-02 LAB — CBC
HCT: 39.2 % (ref 35.0–47.0)
Hemoglobin: 12.7 g/dL (ref 12.0–16.0)
MCH: 30.8 pg (ref 26.0–34.0)
MCHC: 32.4 g/dL (ref 32.0–36.0)
MCV: 94.9 fL (ref 80.0–100.0)
Platelets: 184 10*3/uL (ref 150–440)
RBC: 4.13 MIL/uL (ref 3.80–5.20)
RDW: 14 % (ref 11.5–14.5)
WBC: 8.8 10*3/uL (ref 3.6–11.0)

## 2014-11-02 LAB — CREATININE, SERUM
Creatinine, Ser: 1.35 mg/dL — ABNORMAL HIGH (ref 0.44–1.00)
GFR calc Af Amer: 44 mL/min — ABNORMAL LOW (ref >60)
GFR calc non Af Amer: 38 mL/min — ABNORMAL LOW (ref >60)

## 2014-11-02 LAB — CALCIUM: Calcium: 8.9 mg/dL (ref 8.9–10.3)

## 2014-11-02 NOTE — Progress Notes (Signed)
[place]  [phone]  Fax:([phone]) [place] DOB: 07-30-1940  MR#: [id number]  URK#:[id number]  Patient Care Team: Sofie Hartigan, MD as PCP - General (Family Medicine)  CHIEF COMPLAINT:  Chief Complaint  Patient presents with  . Follow-up   Patient initially seen in office by Dr. Inez Pilgrim with anemia, attributed to multiple myeloma, patient's monoclonal gammopathy initially spiked to 1.1 g, with high serum free light chains but low urine Bence-Jones proteins. Patient had subsequent bone marrow biopsy on 10/24/2010 with 10-20% plasma cells, some abnormalities including 13 deletion on FISH, negative skeletal survey. She is now status post bone marrow transplant a [facility] from May 2013. Patient also has a history of left kidney lesion seen on ultrasound in June 2012, followed by MRI in July 2012, incompletely characterized but likely a cyst. Also with history of DJD, with low back pain, not related to disease.  INTERVAL HISTORY:  Patient is here today for further evaluation regarding multiple myeloma. She is status post bone marrow transplant in May 2013 he [facility] under the direction of Dr. Valarie Merino. Overall she is feeling fairly well, denies any acute complaints or issues. She has no new bone pain, continues with chronic low back pain related to DJD. SPEP was checked today as well as light chains.  REVIEW OF SYSTEMS:   Review of Systems  Constitutional: Negative for fever, chills, weight loss, malaise/fatigue and diaphoresis.  HENT: Negative for congestion, ear discharge, ear pain, hearing loss, nosebleeds, sore throat and tinnitus.   Eyes: Negative for blurred vision, double vision, photophobia, pain, discharge and redness.  Respiratory: Negative for cough, hemoptysis, sputum production, shortness of breath, wheezing and stridor.   Cardiovascular: Negative for chest pain, palpitations, orthopnea, claudication, leg swelling and PND.    Gastrointestinal: Negative for heartburn, nausea, vomiting, abdominal pain, diarrhea, constipation, blood in stool and melena.  Genitourinary: Negative.   Musculoskeletal: Negative.   Skin: Negative.   Neurological: Negative for dizziness, tingling, focal weakness, seizures, weakness and headaches.  Endo/Heme/Allergies: Does not bruise/bleed easily.  Psychiatric/Behavioral: Negative for depression. The patient is not nervous/anxious and does not have insomnia.     As per HPI. Otherwise, a complete review of systems is negatve.  ONCOLOGY HISTORY:  No history exists.    PAST MEDICAL HISTORY: No past medical history on file.  PAST SURGICAL HISTORY: No past surgical history on file.  FAMILY HISTORY No family history on file.  GYNECOLOGIC HISTORY:  No LMP recorded.     ADVANCED DIRECTIVES:    HEALTH MAINTENANCE: History  Substance Use Topics  . Smoking status: Not on file  . Smokeless tobacco: Not on file  . Alcohol Use: Not on file     Colonoscopy:  PAP:  Bone density:  Lipid panel:  Allergies  Allergen Reactions  . Isoniazid Other (See Comments)    Blisters     Current Outpatient Prescriptions  Medication Sig Dispense Refill  . allopurinol (ZYLOPRIM) 300 MG tablet Take by mouth.    Marland Kitchen atenolol (TENORMIN) 50 MG tablet Take by mouth.    . gabapentin (NEURONTIN) 300 MG capsule 1 po qAM, 1 qLunch, and 2 qLunch    . hydroxychloroquine (PLAQUENIL) 200 MG tablet TAKE 2 TABLETS BY MOUTH DAILY    . traMADol (ULTRAM) 50 MG tablet TAKE 1 TABLET BY MOUTH FOUR TIMES DAILY AS NEEDED    . triamterene-hydrochlorothiazide (MAXZIDE-25) 37.5-25 MG per tablet TAKE 1 TABLET BY MOUTH DAILY AS NEEDED    .  diclofenac sodium (VOLTAREN) 1 % GEL Apply topically.    Marland Kitchen estrogens, conjugated, (PREMARIN) 0.625 MG tablet Take by mouth.    . fexofenadine (ALLEGRA) 180 MG tablet Take by mouth.    . fluticasone (FLONASE) 50 MCG/ACT nasal spray Place into the nose.    . lansoprazole (PREVACID)  30 MG capsule Take by mouth.     No current facility-administered medications for this visit.    OBJECTIVE: BP 144/76 mmHg  Pulse 67  Temp(Src) 97.1 F (36.2 C) (Tympanic)  Wt 175 lb 0.7 oz (79.4 kg)   There is no height on file to calculate BMI.    ECOG FS:0 - Asymptomatic  General: Well-developed, well-nourished, no acute distress. Eyes: Pink conjunctiva, anicteric sclera. HEENT: Normocephalic, moist mucous membranes, clear oropharnyx. Lungs: Clear to auscultation bilaterally. Heart: Regular rate and rhythm. No rubs, murmurs, or gallops. Abdomen: Soft, nontender, nondistended. No organomegaly noted, normoactive bowel sounds. Musculoskeletal: No edema, cyanosis, or clubbing. Chronic low back pain  Neuro: Alert, answering all questions appropriately. Cranial nerves grossly intact. Skin: No rashes or petechiae noted. Psych: Normal affect. Lymphatics: No cervical, calvicular, axillary or inguinal LAD.   LAB RESULTS:  Appointment on 11/02/2014  Component Date Value Ref Range Status  . WBC 11/02/2014 8.8  3.6 - 11.0 K/uL Final  . RBC 11/02/2014 4.13  3.80 - 5.20 MIL/uL Final  . Hemoglobin 11/02/2014 12.7  12.0 - 16.0 g/dL Final  . HCT 11/02/2014 39.2  35.0 - 47.0 % Final  . MCV 11/02/2014 94.9  80.0 - 100.0 fL Final  . MCH 11/02/2014 30.8  26.0 - 34.0 pg Final  . MCHC 11/02/2014 32.4  32.0 - 36.0 g/dL Final  . RDW 11/02/2014 14.0  11.5 - 14.5 % Final  . Platelets 11/02/2014 184  150 - 440 K/uL Final  . Creatinine, Ser 11/02/2014 1.35* 0.44 - 1.00 mg/dL Final  . GFR calc non Af Amer 11/02/2014 38* >60 mL/min Final  . GFR calc Af Amer 11/02/2014 44* >60 mL/min Final   Comment: (NOTE) The eGFR has been calculated using the CKD EPI equation. This calculation has not been validated in all clinical situations. eGFR's persistently <60 mL/min signify possible Chronic Kidney Disease.   . Calcium 11/02/2014 8.9  8.9 - 10.3 mg/dL Final    STUDIES: No results found.  ASSESSMENT:    Multiple myeloma Abnormal renal lesion  PLAN:   1. Multiple myeloma. Patient is status post transplant from May 2013 at [facility] under the direction of Dr. Valarie Merino. Patient continues to have chronic back pain not related to multiple myeloma, and denies any new pains. SPEP today is negative for any M spike, light chains also remains stable. Creatinine is 1.35 which appears her baseline if not better. 2. Abnormal renal lesion. Initially seen in June 2012 on ultrasound, appeared cystic. MRI follow up in July 2012 also noted to be cystic in nature last imaging noted in 2013 with some slight enlargement but a benign appearance.  Patient will return in 3 months for routine follow-up.  Patient expressed understanding and was in agreement with this plan. She also understands that She can call clinic at any time with any questions, concerns, or complaints.   Dr. Oliva Bustard was available for consultation and review of plan of care for this patient.   Evlyn Kanner, NP   11/02/2014 11:45 AM

## 2014-11-04 LAB — KAPPA/LAMBDA LIGHT CHAINS
Kappa free light chain: 29.95 mg/L — ABNORMAL HIGH (ref 3.30–19.40)
Kappa, lambda light chain ratio: 1.74 — ABNORMAL HIGH (ref 0.26–1.65)
Lambda free light chains: 17.25 mg/L (ref 5.71–26.30)

## 2014-11-05 LAB — PROTEIN ELECTROPHORESIS, SERUM
A/G Ratio: 1.2 (ref 0.7–1.7)
Albumin ELP: 3.7 g/dL (ref 2.9–4.4)
Alpha-1-Globulin: 0.2 g/dL (ref 0.0–0.4)
Alpha-2-Globulin: 0.9 g/dL (ref 0.4–1.0)
Beta Globulin: 1 g/dL (ref 0.7–1.3)
Gamma Globulin: 0.8 g/dL (ref 0.4–1.8)
Globulin, Total: 3 g/dL (ref 2.2–3.9)
Total Protein ELP: 6.7 g/dL (ref 6.0–8.5)

## 2014-11-15 ENCOUNTER — Other Ambulatory Visit: Payer: Self-pay | Admitting: Family Medicine

## 2014-11-30 ENCOUNTER — Inpatient Hospital Stay: Payer: Medicare Other | Attending: Family Medicine

## 2014-11-30 DIAGNOSIS — C9000 Multiple myeloma not having achieved remission: Secondary | ICD-10-CM | POA: Diagnosis present

## 2014-11-30 LAB — CBC WITH DIFFERENTIAL/PLATELET
Basophils Absolute: 0 10*3/uL (ref 0–0.1)
Basophils Relative: 0 %
Eosinophils Absolute: 0 10*3/uL (ref 0–0.7)
Eosinophils Relative: 0 %
HCT: 36.8 % (ref 35.0–47.0)
Hemoglobin: 12.1 g/dL (ref 12.0–16.0)
Lymphocytes Relative: 28 %
Lymphs Abs: 2.8 10*3/uL (ref 1.0–3.6)
MCH: 31.2 pg (ref 26.0–34.0)
MCHC: 32.8 g/dL (ref 32.0–36.0)
MCV: 95 fL (ref 80.0–100.0)
Monocytes Absolute: 0.7 10*3/uL (ref 0.2–0.9)
Monocytes Relative: 7 %
Neutro Abs: 6.4 10*3/uL (ref 1.4–6.5)
Neutrophils Relative %: 65 %
Platelets: 177 10*3/uL (ref 150–440)
RBC: 3.87 MIL/uL (ref 3.80–5.20)
RDW: 14.2 % (ref 11.5–14.5)
WBC: 10 10*3/uL (ref 3.6–11.0)

## 2014-11-30 LAB — BASIC METABOLIC PANEL
Anion gap: 6 (ref 5–15)
BUN: 37 mg/dL — ABNORMAL HIGH (ref 6–20)
CO2: 28 mmol/L (ref 22–32)
Calcium: 9.1 mg/dL (ref 8.9–10.3)
Chloride: 104 mmol/L (ref 101–111)
Creatinine, Ser: 1.42 mg/dL — ABNORMAL HIGH (ref 0.44–1.00)
GFR calc Af Amer: 41 mL/min — ABNORMAL LOW (ref >60)
GFR calc non Af Amer: 35 mL/min — ABNORMAL LOW (ref >60)
Glucose, Bld: 90 mg/dL (ref 65–99)
Potassium: 3.8 mmol/L (ref 3.5–5.1)
Sodium: 138 mmol/L (ref 135–145)

## 2014-12-02 LAB — KAPPA/LAMBDA LIGHT CHAINS
Kappa free light chain: 19.28 mg/L (ref 3.30–19.40)
Kappa, lambda light chain ratio: 1.49 (ref 0.26–1.65)
Lambda free light chains: 12.97 mg/L (ref 5.71–26.30)

## 2014-12-03 LAB — PROTEIN ELECTROPHORESIS, SERUM
A/G Ratio: 1.3 (ref 0.7–1.7)
Albumin ELP: 3.5 g/dL (ref 2.9–4.4)
Alpha-1-Globulin: 0.3 g/dL (ref 0.0–0.4)
Alpha-2-Globulin: 0.8 g/dL (ref 0.4–1.0)
Beta Globulin: 0.9 g/dL (ref 0.7–1.3)
Gamma Globulin: 0.6 g/dL (ref 0.4–1.8)
Globulin, Total: 2.7 g/dL (ref 2.2–3.9)
Total Protein ELP: 6.2 g/dL (ref 6.0–8.5)

## 2014-12-04 ENCOUNTER — Other Ambulatory Visit: Payer: Self-pay | Admitting: Family Medicine

## 2014-12-28 ENCOUNTER — Inpatient Hospital Stay: Payer: Medicare Other | Attending: Family Medicine

## 2014-12-28 DIAGNOSIS — C9000 Multiple myeloma not having achieved remission: Secondary | ICD-10-CM | POA: Diagnosis present

## 2014-12-28 LAB — CBC WITH DIFFERENTIAL/PLATELET
Basophils Absolute: 0.1 10*3/uL (ref 0–0.1)
Basophils Relative: 1 %
Eosinophils Absolute: 0.1 10*3/uL (ref 0–0.7)
Eosinophils Relative: 2 %
HCT: 38 % (ref 35.0–47.0)
Hemoglobin: 12.8 g/dL (ref 12.0–16.0)
Lymphocytes Relative: 22 %
Lymphs Abs: 1.5 10*3/uL (ref 1.0–3.6)
MCH: 31.3 pg (ref 26.0–34.0)
MCHC: 33.6 g/dL (ref 32.0–36.0)
MCV: 93.2 fL (ref 80.0–100.0)
Monocytes Absolute: 0.5 10*3/uL (ref 0.2–0.9)
Monocytes Relative: 8 %
Neutro Abs: 4.5 10*3/uL (ref 1.4–6.5)
Neutrophils Relative %: 67 %
Platelets: 207 10*3/uL (ref 150–440)
RBC: 4.08 MIL/uL (ref 3.80–5.20)
RDW: 13.6 % (ref 11.5–14.5)
WBC: 6.7 10*3/uL (ref 3.6–11.0)

## 2014-12-28 LAB — CREATININE, SERUM
Creatinine, Ser: 1.52 mg/dL — ABNORMAL HIGH (ref 0.44–1.00)
GFR calc Af Amer: 38 mL/min — ABNORMAL LOW (ref >60)
GFR calc non Af Amer: 33 mL/min — ABNORMAL LOW (ref >60)

## 2015-01-16 ENCOUNTER — Other Ambulatory Visit: Payer: Self-pay | Admitting: Physical Medicine and Rehabilitation

## 2015-01-16 DIAGNOSIS — M5416 Radiculopathy, lumbar region: Secondary | ICD-10-CM

## 2015-01-23 ENCOUNTER — Ambulatory Visit
Admission: RE | Admit: 2015-01-23 | Discharge: 2015-01-23 | Disposition: A | Discharge status: Home / Self Care | Payer: Medicare Other | Source: Ambulatory Visit | Attending: Physical Medicine and Rehabilitation | Admitting: Physical Medicine and Rehabilitation

## 2015-01-23 DIAGNOSIS — M5416 Radiculopathy, lumbar region: Secondary | ICD-10-CM | POA: Insufficient documentation

## 2015-01-23 DIAGNOSIS — M4807 Spinal stenosis, lumbosacral region: Secondary | ICD-10-CM | POA: Diagnosis not present

## 2015-01-25 ENCOUNTER — Other Ambulatory Visit: Payer: Medicare Other

## 2015-01-25 ENCOUNTER — Ambulatory Visit: Payer: Medicare Other

## 2015-02-01 ENCOUNTER — Ambulatory Visit: Payer: Medicare Other | Admitting: Internal Medicine

## 2015-02-01 ENCOUNTER — Ambulatory Visit: Payer: Medicare Other

## 2015-02-01 ENCOUNTER — Inpatient Hospital Stay: Payer: Medicare Other | Attending: Internal Medicine | Admitting: Internal Medicine

## 2015-02-01 ENCOUNTER — Encounter: Payer: Self-pay | Admitting: Internal Medicine

## 2015-02-01 ENCOUNTER — Inpatient Hospital Stay: Payer: Medicare Other

## 2015-02-01 ENCOUNTER — Other Ambulatory Visit: Payer: Medicare Other

## 2015-02-01 VITALS — BP 120/74 | HR 70 | Temp 97.9°F | Resp 18 | Ht 67.0 in | Wt 173.9 lb

## 2015-02-01 DIAGNOSIS — G8929 Other chronic pain: Secondary | ICD-10-CM | POA: Insufficient documentation

## 2015-02-01 DIAGNOSIS — Z79899 Other long term (current) drug therapy: Secondary | ICD-10-CM | POA: Diagnosis not present

## 2015-02-01 DIAGNOSIS — M129 Arthropathy, unspecified: Secondary | ICD-10-CM | POA: Insufficient documentation

## 2015-02-01 DIAGNOSIS — Z23 Encounter for immunization: Secondary | ICD-10-CM | POA: Diagnosis not present

## 2015-02-01 DIAGNOSIS — N189 Chronic kidney disease, unspecified: Secondary | ICD-10-CM | POA: Insufficient documentation

## 2015-02-01 DIAGNOSIS — M549 Dorsalgia, unspecified: Secondary | ICD-10-CM | POA: Insufficient documentation

## 2015-02-01 DIAGNOSIS — N289 Disorder of kidney and ureter, unspecified: Secondary | ICD-10-CM

## 2015-02-01 DIAGNOSIS — C9000 Multiple myeloma not having achieved remission: Secondary | ICD-10-CM

## 2015-02-01 LAB — CBC WITH DIFFERENTIAL/PLATELET
Basophils Absolute: 0.1 10*3/uL (ref 0–0.1)
Basophils Relative: 1 %
Eosinophils Absolute: 0.1 10*3/uL (ref 0–0.7)
Eosinophils Relative: 1 %
HCT: 38.1 % (ref 35.0–47.0)
Hemoglobin: 12.7 g/dL (ref 12.0–16.0)
Lymphocytes Relative: 22 %
Lymphs Abs: 1.8 10*3/uL (ref 1.0–3.6)
MCH: 31.5 pg (ref 26.0–34.0)
MCHC: 33.4 g/dL (ref 32.0–36.0)
MCV: 94.4 fL (ref 80.0–100.0)
Monocytes Absolute: 0.5 10*3/uL (ref 0.2–0.9)
Monocytes Relative: 6 %
Neutro Abs: 5.7 10*3/uL (ref 1.4–6.5)
Neutrophils Relative %: 70 %
Platelets: 179 10*3/uL (ref 150–440)
RBC: 4.04 MIL/uL (ref 3.80–5.20)
RDW: 13.4 % (ref 11.5–14.5)
WBC: 8.1 10*3/uL (ref 3.6–11.0)

## 2015-02-01 LAB — COMPREHENSIVE METABOLIC PANEL
ALT: 11 U/L — ABNORMAL LOW (ref 14–54)
AST: 28 U/L (ref 15–41)
Albumin: 4.2 g/dL (ref 3.5–5.0)
Alkaline Phosphatase: 59 U/L (ref 38–126)
Anion gap: 9 (ref 5–15)
BUN: 27 mg/dL — ABNORMAL HIGH (ref 6–20)
CO2: 25 mmol/L (ref 22–32)
Calcium: 9.5 mg/dL (ref 8.9–10.3)
Chloride: 102 mmol/L (ref 101–111)
Creatinine, Ser: 1.62 mg/dL — ABNORMAL HIGH (ref 0.44–1.00)
GFR calc Af Amer: 35 mL/min — ABNORMAL LOW (ref >60)
GFR calc non Af Amer: 30 mL/min — ABNORMAL LOW (ref >60)
Glucose, Bld: 104 mg/dL — ABNORMAL HIGH (ref 65–99)
Potassium: 3.6 mmol/L (ref 3.5–5.1)
Sodium: 136 mmol/L (ref 135–145)
Total Bilirubin: 0.5 mg/dL (ref 0.3–1.2)
Total Protein: 7 g/dL (ref 6.5–8.1)

## 2015-02-01 LAB — CREATININE, SERUM
Creatinine, Ser: 1.72 mg/dL — ABNORMAL HIGH (ref 0.44–1.00)
GFR calc Af Amer: 33 mL/min — ABNORMAL LOW (ref >60)
GFR calc non Af Amer: 28 mL/min — ABNORMAL LOW (ref >60)

## 2015-02-01 MED ORDER — INFLUENZA VAC SPLIT QUAD 0.5 ML IM SUSY
0.5000 mL | PREFILLED_SYRINGE | Freq: Once | INTRAMUSCULAR | Status: AC
  Administered 2015-02-01: 0.5 mL via INTRAMUSCULAR
  Filled 2015-02-01: qty 0.5

## 2015-02-01 NOTE — Progress Notes (Signed)
[place] OFFICE PROGRESS NOTE  Patient Care Team: [name], MD as PCP - General (Internal Medicine)  HPI  SUMMARY OF ONCOLOGIC HISTORY:  # 2012- MULTIPLE MYELOMA IgG Kappa Light chain FISH del13; s/p AUTO- BMT [MAY 2013; Dr.Gabriel; [facility]] SEP 2016- M-PROTEIN NEG; K/L= 1.49/N; NO MAINTENANCE THERAPY  # CKD [~ creat 1.35- 1.7]   # 2012- LEFT KIDNEY ? 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 beingn      INTERVAL HISTORY: This is a pleasant 74 year old female patient with above history of multiple  Myeloma currently on surveillance/no maintenance therapy is here for follow-up.  Patient overall feels okay she is chronic back pain for which he gets injections. She denies any unusual aches and pains nausea vomiting she also denies any unusual swelling in the legs or tingling and numbness.  Patient has chronic arthritic pain in her small joints of the hand.   REVIEW OF SYSTEMS:   10 point review of system is done is negative for mentioned above and in history of present illness  I have reviewed the past medical history, past surgical history, social history and family history with the patient and they are unchanged from previous note unless stated above.  ALLERGIES:  is allergic to isoniazid.  MEDICATIONS:  Current Outpatient Prescriptions  Medication Sig Dispense Refill  . allopurinol (ZYLOPRIM) 300 MG tablet Take by mouth.    Marland Kitchen atenolol (TENORMIN) 50 MG tablet Take 50 mg by mouth daily.     . diclofenac sodium (VOLTAREN) 1 % GEL Apply topically.    Marland Kitchen estrogens, conjugated, (PREMARIN) 0.625 MG tablet Take 0.625 mg by mouth daily.     . fexofenadine (ALLEGRA) 180 MG tablet Take by mouth.    . fluticasone (FLONASE) 50 MCG/ACT nasal spray Place into the nose.    . gabapentin (NEURONTIN) 300 MG capsule 1 po at AM, 1 at Lunch, and 2 tablets bedtime    . hydroxychloroquine (PLAQUENIL) 200 MG tablet TAKE 2 TABLETS BY MOUTH DAILY    .  lansoprazole (PREVACID) 30 MG capsule Take 30 mg by mouth daily at 12 noon.     . traMADol (ULTRAM) 50 MG tablet TAKE 1 TABLET BY MOUTH FOUR TIMES DAILY AS NEEDED    . triamterene-hydrochlorothiazide (MAXZIDE-25) 37.5-25 MG per tablet TAKE 1 TABLET BY MOUTH DAILY AS NEEDED     Current Facility-Administered Medications  Medication Dose Route Frequency Provider Last Rate Last Dose  . Influenza vac split quadrivalent PF (FLUARIX) injection 0.5 mL  0.5 mL Intramuscular Once Cammie Sickle, MD        PHYSICAL EXAMINATION: ECOG PERFORMANCE STATUS: 0 - Asymptomatic  BP 120/74 mmHg  Pulse 70  Temp(Src) 97.9 F (36.6 C) (Tympanic)  Resp 18  Ht '5\' 7"'  (1.702 m)  Wt 173 lb 15.1 oz (78.9 kg)  BMI 27.24 kg/m2  Filed Weights   02/01/15 1342  Weight: 173 lb 15.1 oz (78.9 kg)    GENERAL:alert, no distress and comfortable. EYES: normal, Conjunctiva are pink and non-injected, sclera clear OROPHARYNX:no exudate, no erythema and lips, buccal mucosa, and tongue normal  NECK: No thyromegaly LYMPH:  no palpable lymphadenopathy in the cervical, axillary or inguinal LUNGS: clear to auscultation with normal breathing effort; No Wheeze or crackles  Cardio-vascular- Regular Rate & Rythm and no murmurs and no lower extremity edema ABDOMEN:abdomen soft, non-tender and normal bowel sounds; No hepato-splenomegaly.  Musculoskeletal:no cyanosis of digits and no clubbing  NEURO: alert & oriented x 3  with fluent speech, no focal motor/sensory deficits. SKIN: ecchymosis bilateral upper extremities [chronic]  LABORATORY DATA:  I have reviewed the data as listed    Component Value Date/Time   NA 138 11/30/2014 0832   NA 142 08/16/2013 1028   K 3.8 11/30/2014 0832   K 4.5 08/16/2013 1028   CL 104 11/30/2014 0832   CL 106 08/16/2013 1028   CO2 28 11/30/2014 0832   CO2 27 08/16/2013 1028   GLUCOSE 90 11/30/2014 0832   GLUCOSE 99 08/16/2013 1028   BUN 37* 11/30/2014 0832   BUN 21* 08/16/2013 1028    CREATININE 1.72* 02/01/2015 1236   CREATININE 1.42* 09/07/2014 0954   CALCIUM 9.1 11/30/2014 0832   CALCIUM 9.3 09/07/2014 0954   PROT 7.4 08/16/2013 1028   ALBUMIN 3.9 08/16/2013 1028   AST 27 08/16/2013 1028   ALT 15 08/16/2013 1028   ALKPHOS 61 08/16/2013 1028   BILITOT 0.3 08/16/2013 1028   GFRNONAA 28* 02/01/2015 1236   GFRNONAA 36* 09/07/2014 0954   GFRNONAA 32* 06/15/2014 0913   GFRAA 33* 02/01/2015 1236   GFRAA 42* 09/07/2014 0954   GFRAA 39* 06/15/2014 0913    No results found for: SPEP, UPEP  Lab Results  Component Value Date   WBC 8.1 02/01/2015   NEUTROABS 5.7 02/01/2015   HGB 12.7 02/01/2015   HCT 38.1 02/01/2015   MCV 94.4 02/01/2015   PLT 179 02/01/2015      Chemistry      Component Value Date/Time   NA 138 11/30/2014 0832   NA 142 08/16/2013 1028   K 3.8 11/30/2014 0832   K 4.5 08/16/2013 1028   CL 104 11/30/2014 0832   CL 106 08/16/2013 1028   CO2 28 11/30/2014 0832   CO2 27 08/16/2013 1028   BUN 37* 11/30/2014 0832   BUN 21* 08/16/2013 1028   CREATININE 1.72* 02/01/2015 1236   CREATININE 1.42* 09/07/2014 0954      Component Value Date/Time   CALCIUM 9.1 11/30/2014 0832   CALCIUM 9.3 09/07/2014 0954   ALKPHOS 61 08/16/2013 1028   AST 27 08/16/2013 1028   ALT 15 08/16/2013 1028   BILITOT 0.3 08/16/2013 1028       RADIOGRAPHIC STUDIES: I have personally reviewed the radiological images as listed and agreed with the findings in the report. No results found.   ASSESSMENT & PLAN:   Multiple myeloma diagnosed in 2012 status post autologous stem cell transplant May 2013 is currently on surveillance. Most recent M protein/Lambda light chain ratio July 2016-negative. CBC within normal limits today.However, patient's function getting worse creatinine 1.7 from baseline of 1.3; recommend repeating a myeloma workup at this time. Also get skeletal survey. Calcium 2 months ago was normal limits. Recommend checking CMP today.    #  worsening renal  function- patient's creatinine previously around 1.3-1.4; today is 1.7. Unclear etiology. See discussion above. Also recommend nephrology evaluation.   # left kidney lesion-question etiology; recommend a follow-up ultrasound.  I discussed the patient and husband that my clinical impression is that she continues to be in remission from her multiple myeloma; however it would be reasonable to reevaluate for myeloma recurrence this time given the worsening kidney function.   All questions were answered. The patient knows to call the clinic with any problems, questions or concerns. No barriers to learning was detected. I spent 25 minutes counseling the patient face to face. The total time spent in the appointment was 40 minutes and more than 50%  was on counseling and review of test results     Cammie Sickle, MD 02/01/2015 2:07 PM

## 2015-02-03 LAB — KAPPA/LAMBDA LIGHT CHAINS
Kappa free light chain: 24.26 mg/L — ABNORMAL HIGH (ref 3.30–19.40)
Kappa, lambda light chain ratio: 1.52 (ref 0.26–1.65)
Lambda free light chains: 15.94 mg/L (ref 5.71–26.30)

## 2015-02-04 LAB — PROTEIN ELECTROPHORESIS, SERUM
A/G Ratio: 1.5 (ref 0.7–1.7)
Albumin ELP: 3.8 g/dL (ref 2.9–4.4)
Alpha-1-Globulin: 0.2 g/dL (ref 0.0–0.4)
Alpha-2-Globulin: 0.9 g/dL (ref 0.4–1.0)
Beta Globulin: 0.9 g/dL (ref 0.7–1.3)
Gamma Globulin: 0.6 g/dL (ref 0.4–1.8)
Globulin, Total: 2.6 g/dL (ref 2.2–3.9)
Total Protein ELP: 6.4 g/dL (ref 6.0–8.5)

## 2015-02-07 ENCOUNTER — Ambulatory Visit
Admission: RE | Admit: 2015-02-07 | Discharge: 2015-02-07 | Disposition: A | Discharge status: Home / Self Care | Payer: Medicare Other | Source: Ambulatory Visit | Attending: Internal Medicine | Admitting: Internal Medicine

## 2015-02-07 ENCOUNTER — Other Ambulatory Visit: Payer: Self-pay | Admitting: Internal Medicine

## 2015-02-07 DIAGNOSIS — N189 Chronic kidney disease, unspecified: Secondary | ICD-10-CM

## 2015-02-07 DIAGNOSIS — N281 Cyst of kidney, acquired: Secondary | ICD-10-CM | POA: Diagnosis not present

## 2015-02-07 DIAGNOSIS — Z09 Encounter for follow-up examination after completed treatment for conditions other than malignant neoplasm: Secondary | ICD-10-CM

## 2015-02-07 DIAGNOSIS — R7989 Other specified abnormal findings of blood chemistry: Secondary | ICD-10-CM

## 2015-02-07 DIAGNOSIS — C9000 Multiple myeloma not having achieved remission: Secondary | ICD-10-CM | POA: Insufficient documentation

## 2015-02-07 DIAGNOSIS — N289 Disorder of kidney and ureter, unspecified: Secondary | ICD-10-CM

## 2015-02-07 DIAGNOSIS — Z23 Encounter for immunization: Secondary | ICD-10-CM

## 2015-02-08 ENCOUNTER — Ambulatory Visit: Payer: Medicare Other | Admitting: Internal Medicine

## 2015-02-08 ENCOUNTER — Other Ambulatory Visit: Payer: Medicare Other

## 2015-03-15 ENCOUNTER — Other Ambulatory Visit: Payer: Self-pay | Admitting: *Deleted

## 2015-03-15 ENCOUNTER — Inpatient Hospital Stay: Payer: Medicare Other | Attending: Internal Medicine

## 2015-03-15 ENCOUNTER — Inpatient Hospital Stay: Payer: Medicare Other

## 2015-03-15 DIAGNOSIS — C9000 Multiple myeloma not having achieved remission: Secondary | ICD-10-CM | POA: Insufficient documentation

## 2015-03-15 LAB — CREATININE, SERUM
Creatinine, Ser: 1.58 mg/dL — ABNORMAL HIGH (ref 0.44–1.00)
GFR calc Af Amer: 36 mL/min — ABNORMAL LOW (ref >60)
GFR calc non Af Amer: 31 mL/min — ABNORMAL LOW (ref >60)

## 2015-05-03 ENCOUNTER — Ambulatory Visit: Payer: Medicare Other | Admitting: Internal Medicine

## 2015-05-03 ENCOUNTER — Other Ambulatory Visit: Payer: Medicare Other

## 2015-05-14 ENCOUNTER — Inpatient Hospital Stay: Payer: Medicare Other

## 2015-05-14 ENCOUNTER — Inpatient Hospital Stay: Payer: Medicare Other | Attending: Internal Medicine | Admitting: Internal Medicine

## 2015-05-14 VITALS — BP 156/81 | HR 67 | Temp 96.9°F | Resp 18 | Ht 67.0 in | Wt 168.7 lb

## 2015-05-14 DIAGNOSIS — N189 Chronic kidney disease, unspecified: Secondary | ICD-10-CM | POA: Diagnosis not present

## 2015-05-14 DIAGNOSIS — M129 Arthropathy, unspecified: Secondary | ICD-10-CM | POA: Diagnosis not present

## 2015-05-14 DIAGNOSIS — C9000 Multiple myeloma not having achieved remission: Secondary | ICD-10-CM | POA: Diagnosis not present

## 2015-05-14 DIAGNOSIS — Z79899 Other long term (current) drug therapy: Secondary | ICD-10-CM | POA: Insufficient documentation

## 2015-05-14 DIAGNOSIS — G8929 Other chronic pain: Secondary | ICD-10-CM | POA: Insufficient documentation

## 2015-05-14 DIAGNOSIS — C9001 Multiple myeloma in remission: Secondary | ICD-10-CM | POA: Diagnosis present

## 2015-05-14 DIAGNOSIS — M549 Dorsalgia, unspecified: Secondary | ICD-10-CM | POA: Diagnosis not present

## 2015-05-14 LAB — CBC WITH DIFFERENTIAL/PLATELET
Basophils Absolute: 0.1 10*3/uL (ref 0–0.1)
Basophils Relative: 1 %
Eosinophils Absolute: 0.1 10*3/uL (ref 0–0.7)
Eosinophils Relative: 2 %
HCT: 38 % (ref 35.0–47.0)
Hemoglobin: 12.7 g/dL (ref 12.0–16.0)
Lymphocytes Relative: 20 %
Lymphs Abs: 1 10*3/uL (ref 1.0–3.6)
MCH: 30.8 pg (ref 26.0–34.0)
MCHC: 33.3 g/dL (ref 32.0–36.0)
MCV: 92.5 fL (ref 80.0–100.0)
Monocytes Absolute: 0.4 10*3/uL (ref 0.2–0.9)
Monocytes Relative: 8 %
Neutro Abs: 3.5 10*3/uL (ref 1.4–6.5)
Neutrophils Relative %: 69 %
Platelets: 173 10*3/uL (ref 150–440)
RBC: 4.1 MIL/uL (ref 3.80–5.20)
RDW: 13.5 % (ref 11.5–14.5)
WBC: 5.1 10*3/uL (ref 3.6–11.0)

## 2015-05-14 LAB — COMPREHENSIVE METABOLIC PANEL WITH GFR
ALT: 11 U/L — ABNORMAL LOW (ref 14–54)
AST: 27 U/L (ref 15–41)
Albumin: 4.2 g/dL (ref 3.5–5.0)
Alkaline Phosphatase: 56 U/L (ref 38–126)
Anion gap: 6 (ref 5–15)
BUN: 35 mg/dL — ABNORMAL HIGH (ref 6–20)
CO2: 23 mmol/L (ref 22–32)
Calcium: 9.1 mg/dL (ref 8.9–10.3)
Chloride: 104 mmol/L (ref 101–111)
Creatinine, Ser: 1.61 mg/dL — ABNORMAL HIGH (ref 0.44–1.00)
GFR calc Af Amer: 35 mL/min — ABNORMAL LOW
GFR calc non Af Amer: 30 mL/min — ABNORMAL LOW
Glucose, Bld: 96 mg/dL (ref 65–99)
Potassium: 3.9 mmol/L (ref 3.5–5.1)
Sodium: 133 mmol/L — ABNORMAL LOW (ref 135–145)
Total Bilirubin: 0.5 mg/dL (ref 0.3–1.2)
Total Protein: 7.6 g/dL (ref 6.5–8.1)

## 2015-05-14 NOTE — Progress Notes (Signed)
[place] OFFICE PROGRESS NOTE  Patient Care Team: [name], MD as PCP - General (Internal Medicine)  HPI  SUMMARY OF ONCOLOGIC HISTORY:  # 2012- MULTIPLE MYELOMA IgG Kappa Light chain FISH del13; s/p AUTO- BMT [MAY 2013; Dr.Gabriel; [facility]] SEP 2016- M-PROTEIN NEG; K/L= 1.49/N; NO MAINTENANCE THERAPY  # CKD [~ creat 1.35- 1.7]   # 2012- LEFT KIDNEY ? 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 beingn      INTERVAL HISTORY: This is a pleasant 75 year old female patient with above history of multiple  Myeloma currently on surveillance/no maintenance therapy is here for follow-up.  There have been no significant changes in her overall condition. She does not have any new complaints. She denies weight loss, vomiting, diarrhea, constipation, new back pain.  Patient has chronic arthritic pain in her small joints of the hand.   REVIEW OF SYSTEMS:   10 point review of system is done is negative for mentioned above and in history of present illness  I have reviewed the past medical history, past surgical history, social history and family history with the patient and they are unchanged from previous note unless stated above.  ALLERGIES:  is allergic to isoniazid.  MEDICATIONS:  Current Outpatient Prescriptions  Medication Sig Dispense Refill  . allopurinol (ZYLOPRIM) 300 MG tablet Take 300 mg by mouth daily.     Marland Kitchen atenolol (TENORMIN) 50 MG tablet Take 50 mg by mouth daily.     . diclofenac sodium (VOLTAREN) 1 % GEL Apply 2 g topically 4 (four) times daily.     Marland Kitchen estrogens, conjugated, (PREMARIN) 0.625 MG tablet Take 0.625 mg by mouth daily.     . fexofenadine (ALLEGRA) 180 MG tablet Take 180 mg by mouth daily.     . fluticasone (FLONASE) 50 MCG/ACT nasal spray Place 2 sprays into the nose daily.     Marland Kitchen gabapentin (NEURONTIN) 300 MG capsule 1 po at AM, 1 at Lunch, and 2 tablets bedtime    . hydroxychloroquine (PLAQUENIL) 200 MG tablet TAKE 2  TABLETS BY MOUTH DAILY    . lansoprazole (PREVACID) 30 MG capsule Take 30 mg by mouth daily at 12 noon.     . traMADol (ULTRAM) 50 MG tablet TAKE 1 TABLET BY MOUTH FOUR TIMES DAILY AS NEEDED    . triamterene-hydrochlorothiazide (MAXZIDE-25) 37.5-25 MG per tablet TAKE 1 TABLET BY MOUTH DAILY AS NEEDED    . leflunomide (ARAVA) 10 MG tablet Take 10 tablets by mouth daily.  1   No current facility-administered medications for this visit.    PHYSICAL EXAMINATION: ECOG PERFORMANCE STATUS: 0 - Asymptomatic  BP 156/81 mmHg  Pulse 67  Temp(Src) 96.9 F (36.1 C) (Tympanic)  Resp 18  Ht _0  (1.702 m)  Wt 168 lb 10.4 oz (76.5 kg)  BMI 26.41 kg/m2  Filed Weights   05/14/15 1056  Weight: 168 lb 10.4 oz (76.5 kg)    GENERAL:alert, no distress and comfortable. EYES: normal, Conjunctiva are pink and non-injected, sclera clear OROPHARYNX:no exudate, no erythema and lips, buccal mucosa, and tongue normal  NECK: No thyromegaly LYMPH:  no palpable lymphadenopathy in the cervical, axillary or inguinal LUNGS: clear to auscultation with normal breathing effort; No Wheeze or crackles  Cardio-vascular- Regular Rate & Rythm and no murmurs and no lower extremity edema ABDOMEN:abdomen soft, non-tender and normal bowel sounds; No hepato-splenomegaly.  Musculoskeletal:no cyanosis of digits and no clubbing  NEURO: alert & oriented x 3 with fluent speech, no focal  motor/sensory deficits. SKIN: ecchymosis bilateral upper extremities [chronic]  LABORATORY DATA:  I have reviewed the data as listed    Component Value Date/Time   NA 133* 05/14/2015 1029   NA 142 08/16/2013 1028   K 3.9 05/14/2015 1029   K 4.5 08/16/2013 1028   CL 104 05/14/2015 1029   CL 106 08/16/2013 1028   CO2 23 05/14/2015 1029   CO2 27 08/16/2013 1028   GLUCOSE 96 05/14/2015 1029   GLUCOSE 99 08/16/2013 1028   BUN 35* 05/14/2015 1029   BUN 21* 08/16/2013 1028   CREATININE 1.61* 05/14/2015 1029   CREATININE 1.42* 09/07/2014  0954   CALCIUM 9.1 05/14/2015 1029   CALCIUM 9.3 09/07/2014 0954   PROT 7.6 05/14/2015 1029   PROT 7.4 08/16/2013 1028   ALBUMIN 4.2 05/14/2015 1029   ALBUMIN 3.9 08/16/2013 1028   AST 27 05/14/2015 1029   AST 27 08/16/2013 1028   ALT 11* 05/14/2015 1029   ALT 15 08/16/2013 1028   ALKPHOS 56 05/14/2015 1029   ALKPHOS 61 08/16/2013 1028   BILITOT 0.5 05/14/2015 1029   BILITOT 0.3 08/16/2013 1028   GFRNONAA 30* 05/14/2015 1029   GFRNONAA 36* 09/07/2014 0954   GFRNONAA 32* 06/15/2014 0913   GFRAA 35* 05/14/2015 1029   GFRAA 42* 09/07/2014 0954   GFRAA 39* 06/15/2014 0913    No results found for: SPEP, UPEP  Lab Results  Component Value Date   WBC 5.1 05/14/2015   NEUTROABS 3.5 05/14/2015   HGB 12.7 05/14/2015   HCT 38.0 05/14/2015   MCV 92.5 05/14/2015   PLT 173 05/14/2015      Chemistry      Component Value Date/Time   NA 133* 05/14/2015 1029   NA 142 08/16/2013 1028   K 3.9 05/14/2015 1029   K 4.5 08/16/2013 1028   CL 104 05/14/2015 1029   CL 106 08/16/2013 1028   CO2 23 05/14/2015 1029   CO2 27 08/16/2013 1028   BUN 35* 05/14/2015 1029   BUN 21* 08/16/2013 1028   CREATININE 1.61* 05/14/2015 1029   CREATININE 1.42* 09/07/2014 0954      Component Value Date/Time   CALCIUM 9.1 05/14/2015 1029   CALCIUM 9.3 09/07/2014 0954   ALKPHOS 56 05/14/2015 1029   ALKPHOS 61 08/16/2013 1028   AST 27 05/14/2015 1029   AST 27 08/16/2013 1028   ALT 11* 05/14/2015 1029   ALT 15 08/16/2013 1028   BILITOT 0.5 05/14/2015 1029   BILITOT 0.3 08/16/2013 1028       RADIOGRAPHIC STUDIES: I have personally reviewed the radiological images as listed and agreed with the findings in the report. No results found.   ASSESSMENT & PLAN:   IgG kappa Multiple myeloma diagnosed in 2012 status post autologous stem cell transplant May 2013 is currently in complete remission without maintenance treatment, on surveillance. Most recent SPEP is negative and free kappa to lambda light  chain ratio is normal in September 2017. We will repeat the lab work today. Clinically, she does not have any evidence of recurrent disease. We will see her again in May 2017, with labs done one week prior to next appointment, and if at that point there is no evidence of recurrent myeloma, we will extend her follow-up to every 6 months.  #  Stage III chronic renal insufficiency-reportedly, patient was seen by a nephrology specialist last week, and, taking into account relative stability of the markers and creatinine clearance, no plans were made for investigations or  treatment.  # left kidney ultrasound of the kidney was done in September 2017, showed stable cyst.      All questions were answered. The patient knows to call the clinic with any problems, questions or concerns. No barriers to learning was detected. I spent 25 minutes counseling the patient face to face. The total time spent in the appointment was 40 minutes and more than 50% was on counseling and review of test results     Roxana Hires, MD 05/14/2015 1:15 PM

## 2015-05-14 NOTE — Progress Notes (Signed)
Mrs. Marlett has finished a prednisone dose pack and amoxicillin for an upper respiratory infection a month ago.

## 2015-05-15 LAB — PROTEIN ELECTROPHORESIS, SERUM
A/G Ratio: 1.1 (ref 0.7–1.7)
Albumin ELP: 3.6 g/dL (ref 2.9–4.4)
Alpha-1-Globulin: 0.3 g/dL (ref 0.0–0.4)
Alpha-2-Globulin: 0.9 g/dL (ref 0.4–1.0)
Beta Globulin: 0.9 g/dL (ref 0.7–1.3)
Gamma Globulin: 1.1 g/dL (ref 0.4–1.8)
Globulin, Total: 3.3 g/dL (ref 2.2–3.9)
Total Protein ELP: 6.9 g/dL (ref 6.0–8.5)

## 2015-05-15 LAB — KAPPA/LAMBDA LIGHT CHAINS
Kappa free light chain: 35.28 mg/L — ABNORMAL HIGH (ref 3.30–19.40)
Kappa, lambda light chain ratio: 1.63 (ref 0.26–1.65)
Lambda free light chains: 21.68 mg/L (ref 5.71–26.30)

## 2015-07-22 DIAGNOSIS — M47817 Spondylosis without myelopathy or radiculopathy, lumbosacral region: Secondary | ICD-10-CM | POA: Insufficient documentation

## 2015-08-27 ENCOUNTER — Other Ambulatory Visit: Payer: Self-pay | Admitting: Neurosurgery

## 2015-08-27 DIAGNOSIS — M5416 Radiculopathy, lumbar region: Secondary | ICD-10-CM

## 2015-09-04 ENCOUNTER — Inpatient Hospital Stay: Payer: Medicare Other | Attending: Internal Medicine

## 2015-09-04 DIAGNOSIS — C9000 Multiple myeloma not having achieved remission: Secondary | ICD-10-CM | POA: Insufficient documentation

## 2015-09-04 LAB — COMPREHENSIVE METABOLIC PANEL
ALT: 11 U/L — ABNORMAL LOW (ref 14–54)
AST: 27 U/L (ref 15–41)
Albumin: 4.2 g/dL (ref 3.5–5.0)
Alkaline Phosphatase: 44 U/L (ref 38–126)
Anion gap: 11 (ref 5–15)
BUN: 24 mg/dL — ABNORMAL HIGH (ref 6–20)
CO2: 24 mmol/L (ref 22–32)
Calcium: 9 mg/dL (ref 8.9–10.3)
Chloride: 106 mmol/L (ref 101–111)
Creatinine, Ser: 1.87 mg/dL — ABNORMAL HIGH (ref 0.44–1.00)
GFR calc Af Amer: 29 mL/min — ABNORMAL LOW (ref >60)
GFR calc non Af Amer: 25 mL/min — ABNORMAL LOW (ref >60)
Glucose, Bld: 77 mg/dL (ref 65–99)
Potassium: 3 mmol/L — ABNORMAL LOW (ref 3.5–5.1)
Sodium: 141 mmol/L (ref 135–145)
Total Bilirubin: 0.5 mg/dL (ref 0.3–1.2)
Total Protein: 7.1 g/dL (ref 6.5–8.1)

## 2015-09-04 LAB — CBC WITH DIFFERENTIAL/PLATELET
Basophils Absolute: 0 10*3/uL (ref 0–0.1)
Basophils Relative: 1 %
Eosinophils Absolute: 0.1 10*3/uL (ref 0–0.7)
Eosinophils Relative: 1 %
HCT: 36.8 % (ref 35.0–47.0)
Hemoglobin: 12.5 g/dL (ref 12.0–16.0)
Lymphocytes Relative: 22 %
Lymphs Abs: 1.4 10*3/uL (ref 1.0–3.6)
MCH: 31.2 pg (ref 26.0–34.0)
MCHC: 33.9 g/dL (ref 32.0–36.0)
MCV: 92.2 fL (ref 80.0–100.0)
Monocytes Absolute: 0.5 10*3/uL (ref 0.2–0.9)
Monocytes Relative: 8 %
Neutro Abs: 4.3 10*3/uL (ref 1.4–6.5)
Neutrophils Relative %: 68 %
Platelets: 180 10*3/uL (ref 150–440)
RBC: 3.99 MIL/uL (ref 3.80–5.20)
RDW: 14 % (ref 11.5–14.5)
WBC: 6.3 10*3/uL (ref 3.6–11.0)

## 2015-09-05 LAB — PROTEIN ELECTROPHORESIS, SERUM
A/G Ratio: 1.3 (ref 0.7–1.7)
Albumin ELP: 3.6 g/dL (ref 2.9–4.4)
Alpha-1-Globulin: 0.3 g/dL (ref 0.0–0.4)
Alpha-2-Globulin: 0.9 g/dL (ref 0.4–1.0)
Beta Globulin: 0.9 g/dL (ref 0.7–1.3)
Gamma Globulin: 0.7 g/dL (ref 0.4–1.8)
Globulin, Total: 2.8 g/dL (ref 2.2–3.9)
Total Protein ELP: 6.4 g/dL (ref 6.0–8.5)

## 2015-09-05 LAB — KAPPA/LAMBDA LIGHT CHAINS
Kappa free light chain: 29.56 mg/L — ABNORMAL HIGH (ref 3.30–19.40)
Kappa, lambda light chain ratio: 1.84 — ABNORMAL HIGH (ref 0.26–1.65)
Lambda free light chains: 16.05 mg/L (ref 5.71–26.30)

## 2015-09-11 ENCOUNTER — Encounter: Payer: Self-pay | Admitting: Family Medicine

## 2015-09-11 ENCOUNTER — Ambulatory Visit: Payer: Medicare Other

## 2015-09-11 ENCOUNTER — Inpatient Hospital Stay: Payer: Medicare Other | Attending: Family Medicine | Admitting: Family Medicine

## 2015-09-11 VITALS — BP 134/69 | HR 74 | Temp 97.2°F | Wt 164.0 lb

## 2015-09-11 DIAGNOSIS — J309 Allergic rhinitis, unspecified: Secondary | ICD-10-CM | POA: Insufficient documentation

## 2015-09-11 DIAGNOSIS — C9000 Multiple myeloma not having achieved remission: Secondary | ICD-10-CM | POA: Insufficient documentation

## 2015-09-11 DIAGNOSIS — G8929 Other chronic pain: Secondary | ICD-10-CM | POA: Insufficient documentation

## 2015-09-11 DIAGNOSIS — M4806 Spinal stenosis, lumbar region: Secondary | ICD-10-CM | POA: Diagnosis not present

## 2015-09-11 DIAGNOSIS — N189 Chronic kidney disease, unspecified: Secondary | ICD-10-CM | POA: Insufficient documentation

## 2015-09-11 DIAGNOSIS — R002 Palpitations: Secondary | ICD-10-CM | POA: Diagnosis not present

## 2015-09-11 DIAGNOSIS — M109 Gout, unspecified: Secondary | ICD-10-CM | POA: Insufficient documentation

## 2015-09-11 DIAGNOSIS — Z79899 Other long term (current) drug therapy: Secondary | ICD-10-CM | POA: Insufficient documentation

## 2015-09-11 DIAGNOSIS — M799 Soft tissue disorder, unspecified: Secondary | ICD-10-CM | POA: Insufficient documentation

## 2015-09-11 DIAGNOSIS — Z809 Family history of malignant neoplasm, unspecified: Secondary | ICD-10-CM | POA: Diagnosis not present

## 2015-09-11 DIAGNOSIS — M549 Dorsalgia, unspecified: Secondary | ICD-10-CM | POA: Insufficient documentation

## 2015-09-11 DIAGNOSIS — M818 Other osteoporosis without current pathological fracture: Secondary | ICD-10-CM | POA: Diagnosis not present

## 2015-09-11 DIAGNOSIS — Z9481 Bone marrow transplant status: Secondary | ICD-10-CM | POA: Diagnosis not present

## 2015-09-11 DIAGNOSIS — K2270 Barrett's esophagus without dysplasia: Secondary | ICD-10-CM | POA: Diagnosis not present

## 2015-09-11 DIAGNOSIS — M069 Rheumatoid arthritis, unspecified: Secondary | ICD-10-CM | POA: Diagnosis not present

## 2015-09-11 DIAGNOSIS — Z8781 Personal history of (healed) traumatic fracture: Secondary | ICD-10-CM | POA: Insufficient documentation

## 2015-09-11 DIAGNOSIS — M7989 Other specified soft tissue disorders: Secondary | ICD-10-CM | POA: Insufficient documentation

## 2015-09-11 DIAGNOSIS — E785 Hyperlipidemia, unspecified: Secondary | ICD-10-CM | POA: Diagnosis not present

## 2015-09-11 DIAGNOSIS — N281 Cyst of kidney, acquired: Secondary | ICD-10-CM | POA: Diagnosis not present

## 2015-09-11 DIAGNOSIS — C9001 Multiple myeloma in remission: Secondary | ICD-10-CM

## 2015-09-11 DIAGNOSIS — K219 Gastro-esophageal reflux disease without esophagitis: Secondary | ICD-10-CM | POA: Insufficient documentation

## 2015-09-11 MED ORDER — POTASSIUM CHLORIDE CRYS ER 20 MEQ PO TBCR: 20.0000 meq | EXTENDED_RELEASE_TABLET | Freq: Two times a day (BID) | ORAL | Status: DC

## 2015-09-11 NOTE — Progress Notes (Signed)
[place]  [phone]  [phone]) [phone]     OLUKEMI PANCHAL DOB: 08-27-1940  MR#: [id number]  CXK#:[id number]  Patient Care Team: [name], MD as PCP - General (Internal Medicine)  CHIEF COMPLAINT:  Chief Complaint  Patient presents with  . Multiple Myeloma    INTERVAL HISTORY:  Patient returns to clinic for continued follow-up regarding multiple myeloma. Patient had a bone marrow biopsy in May 2013 and he is here for routine surveillance. She overall feels well except for increasing pain in joints related to rheumatoid arthritis. Her rheumatologist has requested opinion regarding starting the methotrexate for RA management. She continues to have chronic arthritic pain in the hands, wrists and also is having severe lower back pain with likely nerve impingement causing numbness in the right leg.  REVIEW OF SYSTEMS:   Review of Systems  Constitutional: Negative for fever, chills, weight loss, malaise/fatigue and diaphoresis.  HENT: Negative.   Eyes: Negative.   Respiratory: Negative for cough, hemoptysis, sputum production, shortness of breath and wheezing.   Cardiovascular: Negative for chest pain, palpitations, orthopnea, claudication, leg swelling and PND.  Gastrointestinal: Negative for heartburn, nausea, vomiting, abdominal pain, diarrhea, constipation, blood in stool and melena.  Genitourinary: Negative.   Musculoskeletal: Positive for back pain and joint pain.       Related to RA  Skin: Negative.   Neurological: Negative for dizziness, tingling, focal weakness, seizures and weakness.  Endo/Heme/Allergies: Bruises/bleeds easily.  Psychiatric/Behavioral: Negative for depression. The patient is not nervous/anxious and does not have insomnia.     As per HPI. Otherwise, a complete review of systems is negatve.  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 benign     Multiple myeloma ([place])   11/01/2014 Initial Diagnosis Multiple myeloma ([place])    PAST MEDICAL HISTORY: Past Medical History  Diagnosis Date  . Multiple myeloma ([place])   . Lumbar stenosis with neurogenic claudication   . Degenerative disc disease, lumbar   . Lumbar radiculitis   . Hyperlipidemia   . Allergic rhinitis   . Heart palpitations   . Renal insufficiency   . Gout   . Osteoporosis   . Compression fracture 2013  . Barrett's esophagus   . 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[place])   . Rheumatoid arthritis ([place])   . Cancer ([place])   . Rheumatoid arthritis ([place])   . Allergy     PAST SURGICAL HISTORY: Past Surgical History  Procedure Laterality Date  . Back surgery    . Appendectomy      FAMILY HISTORY Family History  Problem Relation Age of Onset  . Cancer Father     GYNECOLOGIC HISTORY:  No LMP recorded. Patient has had a hysterectomy.     ADVANCED DIRECTIVES:    HEALTH MAINTENANCE: Social History  Substance Use Topics  . Smoking status: Never Smoker   . Smokeless tobacco: Never Used  . Alcohol Use: No     Colonoscopy:  PAP:  Bone density:  Mammogram:  Allergies  Allergen Reactions  . Isoniazid Other (See Comments)    Blisters     Current Outpatient Prescriptions  Medication Sig Dispense Refill  . allopurinol (ZYLOPRIM) 300 MG tablet Take 300 mg by  mouth daily.     Marland Kitchen atenolol (TENORMIN) 50 MG tablet Take 50 mg by mouth daily.     . diclofenac sodium (VOLTAREN) 1 % GEL Apply 2 g topically 4 (four) times daily.     Marland Kitchen estrogens, conjugated, (PREMARIN) 0.625 MG tablet Take 0.625 mg by mouth daily.     . fexofenadine (ALLEGRA) 180 MG tablet Take 180 mg by mouth daily.     . fluticasone (FLONASE) 50 MCG/ACT nasal spray Place 2 sprays into the nose  daily.     Marland Kitchen gabapentin (NEURONTIN) 300 MG capsule 1 po at AM, 1 at Lunch, and 2 tablets bedtime    . hydroxychloroquine (PLAQUENIL) 200 MG tablet TAKE 2 TABLETS BY MOUTH DAILY    . lansoprazole (PREVACID) 30 MG capsule Take 30 mg by mouth daily at 12 noon.     . leflunomide (ARAVA) 10 MG tablet Take 10 tablets by mouth daily.  1  . traMADol (ULTRAM) 50 MG tablet TAKE 1 TABLET BY MOUTH FOUR TIMES DAILY AS NEEDED    . triamterene-hydrochlorothiazide (MAXZIDE-25) 37.5-25 MG per tablet TAKE 1 TABLET BY MOUTH DAILY AS NEEDED    . potassium chloride SA (K-DUR,KLOR-CON) 20 MEQ tablet Take 1 tablet (20 mEq total) by mouth 2 (two) times daily. 20 tablet 0   No current facility-administered medications for this visit.    OBJECTIVE: BP 134/69 mmHg  Pulse 74  Temp(Src) 97.2 F (36.2 C) (Tympanic)  Wt 164 lb 0.4 oz (74.4 kg)   Body mass index is 25.68 kg/(m^2).    ECOG FS:1 - Symptomatic but completely ambulatory  General: Well-developed, well-nourished, no acute distress. Eyes: Pink conjunctiva, anicteric sclera. HEENT: Normocephalic, moist mucous membranes, clear oropharnyx. Lungs: Clear to auscultation bilaterally. Heart: Regular rate and rhythm. No rubs, murmurs, or gallops. Abdomen: Soft, nontender, nondistended. No organomegaly noted, normoactive bowel sounds. Musculoskeletal: Multiple joints swollen in hands and wrist area Neuro: Alert, answering all questions appropriately. Cranial nerves grossly intact. Skin: No rashes or petechiae noted. Psych: Normal affect. Lymphatics: No cervical, clavicular, or axillary LAD.   LAB RESULTS:  No visits with results within 3 Day(s) from this visit. Latest known visit with results is:  Appointment on 09/04/2015  Component Date Value Ref Range Status  . Sodium 09/04/2015 141  135 - 145 mmol/L Final  . Potassium 09/04/2015 3.0* 3.5 - 5.1 mmol/L Final  . Chloride 09/04/2015 106  101 - 111 mmol/L Final  . CO2 09/04/2015 24  22 - 32 mmol/L Final    . Glucose, Bld 09/04/2015 77  65 - 99 mg/dL Final  . BUN 09/04/2015 24* 6 - 20 mg/dL Final  . Creatinine, Ser 09/04/2015 1.87* 0.44 - 1.00 mg/dL Final  . Calcium 09/04/2015 9.0  8.9 - 10.3 mg/dL Final  . Total Protein 09/04/2015 7.1  6.5 - 8.1 g/dL Final  . Albumin 09/04/2015 4.2  3.5 - 5.0 g/dL Final  . AST 09/04/2015 27  15 - 41 U/L Final  . ALT 09/04/2015 11* 14 - 54 U/L Final  . Alkaline Phosphatase 09/04/2015 44  38 - 126 U/L Final  . Total Bilirubin 09/04/2015 0.5  0.3 - 1.2 mg/dL Final  . GFR calc non Af Amer 09/04/2015 25* >60 mL/min Final  . GFR calc Af Amer 09/04/2015 29* >60 mL/min Final   Comment: (NOTE) The eGFR has been calculated using the CKD EPI equation. This calculation has not been validated in all clinical situations. eGFR's persistently <60 mL/min signify possible Chronic Kidney Disease.   Marland Kitchen  Anion gap 09/04/2015 11  5 - 15 Final  . WBC 09/04/2015 6.3  3.6 - 11.0 K/uL Final  . RBC 09/04/2015 3.99  3.80 - 5.20 MIL/uL Final  . Hemoglobin 09/04/2015 12.5  12.0 - 16.0 g/dL Final  . HCT 09/04/2015 36.8  35.0 - 47.0 % Final  . MCV 09/04/2015 92.2  80.0 - 100.0 fL Final  . MCH 09/04/2015 31.2  26.0 - 34.0 pg Final  . MCHC 09/04/2015 33.9  32.0 - 36.0 g/dL Final  . RDW 09/04/2015 14.0  11.5 - 14.5 % Final  . Platelets 09/04/2015 180  150 - 440 K/uL Final  . Neutrophils Relative % 09/04/2015 68   Final  . Neutro Abs 09/04/2015 4.3  1.4 - 6.5 K/uL Final  . Lymphocytes Relative 09/04/2015 22   Final  . Lymphs Abs 09/04/2015 1.4  1.0 - 3.6 K/uL Final  . Monocytes Relative 09/04/2015 8   Final  . Monocytes Absolute 09/04/2015 0.5  0.2 - 0.9 K/uL Final  . Eosinophils Relative 09/04/2015 1   Final  . Eosinophils Absolute 09/04/2015 0.1  0 - 0.7 K/uL Final  . Basophils Relative 09/04/2015 1   Final  . Basophils Absolute 09/04/2015 0.0  0 - 0.1 K/uL Final  . Kappa free light chain 09/04/2015 29.56* 3.30 - 19.40 mg/L Final  . Lamda free light chains 09/04/2015 16.05  5.71  - 26.30 mg/L Final  . Kappa, lamda light chain ratio 09/04/2015 1.84* 0.26 - 1.65 Final   Comment: (NOTE) Performed At: [facility] [place], [place] [id number] Lindon Romp MD [id number]   . Total Protein ELP 09/04/2015 6.4  6.0 - 8.5 g/dL Final  . Albumin ELP 09/04/2015 3.6  2.9 - 4.4 g/dL Final  . Alpha-1-Globulin 09/04/2015 0.3  0.0 - 0.4 g/dL Final  . Alpha-2-Globulin 09/04/2015 0.9  0.4 - 1.0 g/dL Final  . Beta Globulin 09/04/2015 0.9  0.7 - 1.3 g/dL Final  . Gamma Globulin 09/04/2015 0.7  0.4 - 1.8 g/dL Final  . M-Spike, % 09/04/2015 Not Observed  Not Observed g/dL Final  . SPE Interp. 09/04/2015 Comment   Final   Comment: (NOTE) The SPE pattern appears essentially unremarkable. Evidence of monoclonal protein is not apparent. Performed At: [facility] [place], [place] [id number] Lindon Romp MD SA:[id number]   . Comment 09/04/2015 Comment   Final   Comment: (NOTE) Protein electrophoresis scan will follow via computer, mail, or courier delivery.   Marland Kitchen GLOBULIN, TOTAL 09/04/2015 2.8  2.2 - 3.9 g/dL Corrected  . A/G Ratio 09/04/2015 1.3  0.7 - 1.7 Corrected    STUDIES: No results found.  ASSESSMENT:  Multiple myeloma.  PLAN:  1. Multiple myeloma. 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 Labs were drawn approximately one week ago that showed no M protein present and a stable/lambda light chain ratio. Patient previously had a skeletal survey in September 2016 that was reported as normal. 2. Worsening renal function. Patient's creatinine was previously around 1.3-1.4, today is 1.86. Ultrasound of kidneys in September 2016 was reported as normal other than a 2.6 cm left renal cyst over the lower pole. She continues to follow closely with nephrology. We will need to monitor closely if she restarts methotrexate. 3. RA. Considering patient is in moderate to severe  discomfort especially due to progressing while in the small joints of hands oncology is okay with restarting methotrexate. This was discussed with Dr.  Brahmanday and he is also in agreement. If and when methotrexate is restarted for RA we would like to continue to monitor her labs approximately every 3 months.  Follow-up appointment will be made for approximately 6 months.  Patient expressed understanding and was in agreement with this plan. She also understands that She can call clinic at any time with any questions, concerns, or complaints.   Dr. Rogue Bussing was available for consultation and review of plan of care for this patient.   Evlyn Kanner, NP   09/11/2015 1:58 PM

## 2015-09-13 ENCOUNTER — Ambulatory Visit
Admission: RE | Admit: 2015-09-13 | Discharge: 2015-09-13 | Disposition: A | Discharge status: Home / Self Care | Payer: Medicare Other | Source: Ambulatory Visit | Attending: Neurosurgery | Admitting: Neurosurgery

## 2015-09-13 DIAGNOSIS — M5416 Radiculopathy, lumbar region: Secondary | ICD-10-CM | POA: Insufficient documentation

## 2015-09-13 DIAGNOSIS — M4806 Spinal stenosis, lumbar region: Secondary | ICD-10-CM | POA: Diagnosis not present

## 2015-09-13 DIAGNOSIS — M5186 Other intervertebral disc disorders, lumbar region: Secondary | ICD-10-CM | POA: Diagnosis not present

## 2015-10-03 ENCOUNTER — Other Ambulatory Visit: Payer: Self-pay | Admitting: Neurosurgery

## 2015-11-14 NOTE — Pre-Procedure Instructions (Signed)
    Sydney Gillespie  11/14/2015      [facility] [id number] [name], [name] [address] AT [address] [facility] [place] [address] Phone: [phone] Fax: [phone]    Your procedure is scheduled on 11/25/15.  Report to [facility] Admitting at 530 A.M.  Call this number if you have problems the morning of surgery:  [phone]   Remember:  Do not eat food or drink liquids after midnight.  Take these medicines the morning of surgery with A SIP OF WATER --allopurinol,xanax,atenolol,lexapro,flonase,neurontin,ultram   Do not wear jewelry, make-up or nail polish.  Do not wear lotions, powders, or perfumes.  You may wear deoderant.  Do not shave 48 hours prior to surgery.  Men may shave face and neck.  Do not bring valuables to the hospital.  [facility] is not responsible for any belongings or valuables.  Contacts, dentures or bridgework may not be worn into surgery.  Leave your suitcase in the car.  After surgery it may be brought to your room.  For patients admitted to the hospital, discharge time will be determined by your treatment team.  Patients discharged the day of surgery will not be allowed to drive home.   Name and phone number of your driver:   Special instructions:   Please read over the following fact sheets that you were given. MRSA Information

## 2015-11-15 ENCOUNTER — Encounter (HOSPITAL_COMMUNITY): Payer: Self-pay

## 2015-11-15 ENCOUNTER — Encounter (HOSPITAL_COMMUNITY)
Admission: RE | Admit: 2015-11-15 | Discharge: 2015-11-15 | Disposition: A | Discharge status: Home / Self Care | Payer: Medicare Other | Source: Ambulatory Visit | Attending: Neurosurgery | Admitting: Neurosurgery

## 2015-11-15 DIAGNOSIS — Z01818 Encounter for other preprocedural examination: Secondary | ICD-10-CM | POA: Insufficient documentation

## 2015-11-15 DIAGNOSIS — I10 Essential (primary) hypertension: Secondary | ICD-10-CM | POA: Insufficient documentation

## 2015-11-15 DIAGNOSIS — Z0183 Encounter for blood typing: Secondary | ICD-10-CM | POA: Insufficient documentation

## 2015-11-15 DIAGNOSIS — R9431 Abnormal electrocardiogram [ECG] [EKG]: Secondary | ICD-10-CM | POA: Diagnosis not present

## 2015-11-15 DIAGNOSIS — Z01812 Encounter for preprocedural laboratory examination: Secondary | ICD-10-CM | POA: Diagnosis not present

## 2015-11-15 DIAGNOSIS — M4316 Spondylolisthesis, lumbar region: Secondary | ICD-10-CM | POA: Insufficient documentation

## 2015-11-15 LAB — CBC
HCT: 36.3 % (ref 36.0–46.0)
Hemoglobin: 11.6 g/dL — ABNORMAL LOW (ref 12.0–15.0)
MCH: 30.9 pg (ref 26.0–34.0)
MCHC: 32 g/dL (ref 30.0–36.0)
MCV: 96.8 fL (ref 78.0–100.0)
Platelets: 181 10*3/uL (ref 150–400)
RBC: 3.75 MIL/uL — ABNORMAL LOW (ref 3.87–5.11)
RDW: 13.2 % (ref 11.5–15.5)
WBC: 7.4 10*3/uL (ref 4.0–10.5)

## 2015-11-15 LAB — TYPE AND SCREEN
ABO/RH(D): O POS
Antibody Screen: NEGATIVE

## 2015-11-15 LAB — BASIC METABOLIC PANEL
Anion gap: 6 (ref 5–15)
BUN: 30 mg/dL — ABNORMAL HIGH (ref 6–20)
CO2: 24 mmol/L (ref 22–32)
Calcium: 9.3 mg/dL (ref 8.9–10.3)
Chloride: 107 mmol/L (ref 101–111)
Creatinine, Ser: 1.55 mg/dL — ABNORMAL HIGH (ref 0.44–1.00)
GFR calc Af Amer: 37 mL/min — ABNORMAL LOW (ref >60)
GFR calc non Af Amer: 32 mL/min — ABNORMAL LOW (ref >60)
Glucose, Bld: 89 mg/dL (ref 65–99)
Potassium: 4.2 mmol/L (ref 3.5–5.1)
Sodium: 137 mmol/L (ref 135–145)

## 2015-11-15 LAB — SURGICAL PCR SCREEN
MRSA, PCR: NEGATIVE
Staphylococcus aureus: NEGATIVE

## 2015-11-15 LAB — ABO/RH: ABO/RH(D): O POS

## 2015-11-22 MED ORDER — CEFAZOLIN SODIUM-DEXTROSE 2-4 GM/100ML-% IV SOLN
2.0000 g | INTRAVENOUS | Status: AC
  Administered 2015-11-25: 2 g via INTRAVENOUS
  Filled 2015-11-22: qty 100

## 2015-11-25 ENCOUNTER — Inpatient Hospital Stay (HOSPITAL_COMMUNITY)
Admission: RE | Admit: 2015-11-25 | Discharge: 2015-11-27 | DRG: 460 | Disposition: A | Discharge status: Home / Self Care | Payer: Medicare Other | Source: Ambulatory Visit | Attending: Neurosurgery | Admitting: Neurosurgery

## 2015-11-25 ENCOUNTER — Inpatient Hospital Stay (HOSPITAL_COMMUNITY): Payer: Medicare Other | Admitting: Anesthesiology

## 2015-11-25 ENCOUNTER — Encounter (HOSPITAL_COMMUNITY): Payer: Self-pay | Admitting: *Deleted

## 2015-11-25 ENCOUNTER — Encounter (HOSPITAL_COMMUNITY)
Admission: RE | Disposition: A | Discharge status: Home / Self Care | Payer: Self-pay | Source: Ambulatory Visit | Attending: Neurosurgery

## 2015-11-25 ENCOUNTER — Inpatient Hospital Stay (HOSPITAL_COMMUNITY): Payer: Medicare Other

## 2015-11-25 DIAGNOSIS — M109 Gout, unspecified: Secondary | ICD-10-CM | POA: Diagnosis present

## 2015-11-25 DIAGNOSIS — Z79899 Other long term (current) drug therapy: Secondary | ICD-10-CM | POA: Diagnosis not present

## 2015-11-25 DIAGNOSIS — C9000 Multiple myeloma not having achieved remission: Secondary | ICD-10-CM | POA: Diagnosis present

## 2015-11-25 DIAGNOSIS — M4806 Spinal stenosis, lumbar region: Secondary | ICD-10-CM | POA: Diagnosis present

## 2015-11-25 DIAGNOSIS — E785 Hyperlipidemia, unspecified: Secondary | ICD-10-CM | POA: Diagnosis present

## 2015-11-25 DIAGNOSIS — Z809 Family history of malignant neoplasm, unspecified: Secondary | ICD-10-CM

## 2015-11-25 DIAGNOSIS — M4316 Spondylolisthesis, lumbar region: Secondary | ICD-10-CM | POA: Diagnosis present

## 2015-11-25 DIAGNOSIS — M810 Age-related osteoporosis without current pathological fracture: Secondary | ICD-10-CM | POA: Diagnosis present

## 2015-11-25 DIAGNOSIS — M069 Rheumatoid arthritis, unspecified: Secondary | ICD-10-CM | POA: Diagnosis present

## 2015-11-25 DIAGNOSIS — Z888 Allergy status to other drugs, medicaments and biological substances status: Secondary | ICD-10-CM | POA: Diagnosis not present

## 2015-11-25 DIAGNOSIS — Z419 Encounter for procedure for purposes other than remedying health state, unspecified: Secondary | ICD-10-CM

## 2015-11-25 SURGERY — POSTERIOR LUMBAR FUSION 1 LEVEL
Anesthesia: General

## 2015-11-25 MED ORDER — BUPIVACAINE LIPOSOME 1.3 % IJ SUSP
20.0000 mL | Freq: Once | INTRAMUSCULAR | Status: DC
  Filled 2015-11-25: qty 20

## 2015-11-25 MED ORDER — GABAPENTIN 300 MG PO CAPS
300.0000 mg | ORAL_CAPSULE | Freq: Three times a day (TID) | ORAL | Status: DC
  Administered 2015-11-25 – 2015-11-27 (×6): 300 mg via ORAL
  Filled 2015-11-25: qty 1
  Filled 2015-11-25 (×2): qty 3
  Filled 2015-11-25: qty 1
  Filled 2015-11-25 (×2): qty 3
  Filled 2015-11-25: qty 1

## 2015-11-25 MED ORDER — LORATADINE 10 MG PO TABS
10.0000 mg | ORAL_TABLET | Freq: Every day | ORAL | Status: DC
  Administered 2015-11-26 – 2015-11-27 (×2): 10 mg via ORAL
  Filled 2015-11-25 (×2): qty 1

## 2015-11-25 MED ORDER — ALPRAZOLAM 0.25 MG PO TABS: 0.2500 mg | ORAL_TABLET | Freq: Two times a day (BID) | ORAL | Status: DC | PRN

## 2015-11-25 MED ORDER — FENTANYL CITRATE (PF) 250 MCG/5ML IJ SOLN
INTRAMUSCULAR | Status: AC
  Filled 2015-11-25: qty 5

## 2015-11-25 MED ORDER — BUPIVACAINE LIPOSOME 1.3 % IJ SUSP
INTRAMUSCULAR | Status: DC | PRN
  Administered 2015-11-25: 20 mL

## 2015-11-25 MED ORDER — HYDROXYCHLOROQUINE SULFATE 200 MG PO TABS
400.0000 mg | ORAL_TABLET | Freq: Every day | ORAL | Status: DC
Start: 2015-11-26 — End: 2015-11-27
  Administered 2015-11-26 – 2015-11-27 (×2): 400 mg via ORAL
  Filled 2015-11-25 (×2): qty 2

## 2015-11-25 MED ORDER — LIDOCAINE 2% (20 MG/ML) 5 ML SYRINGE
INTRAMUSCULAR | Status: AC
  Filled 2015-11-25: qty 5

## 2015-11-25 MED ORDER — FENTANYL CITRATE (PF) 100 MCG/2ML IJ SOLN
INTRAMUSCULAR | Status: DC | PRN
  Administered 2015-11-25 (×6): 50 ug via INTRAVENOUS

## 2015-11-25 MED ORDER — THROMBIN 20000 UNITS EX SOLR
CUTANEOUS | Status: DC | PRN
  Administered 2015-11-25 (×2): via TOPICAL

## 2015-11-25 MED ORDER — HYDROCODONE-ACETAMINOPHEN 5-325 MG PO TABS: 1.0000 | ORAL_TABLET | ORAL | Status: DC | PRN

## 2015-11-25 MED ORDER — OXYCODONE HCL 5 MG PO TABS: 5.0000 mg | ORAL_TABLET | Freq: Once | ORAL | Status: DC | PRN

## 2015-11-25 MED ORDER — HYDROMORPHONE HCL 1 MG/ML IJ SOLN: 0.2500 mg | INTRAMUSCULAR | Status: DC | PRN

## 2015-11-25 MED ORDER — ALLOPURINOL 300 MG PO TABS
300.0000 mg | ORAL_TABLET | Freq: Every day | ORAL | Status: DC
  Administered 2015-11-26 – 2015-11-27 (×2): 300 mg via ORAL
  Filled 2015-11-25 (×2): qty 1

## 2015-11-25 MED ORDER — METHOCARBAMOL 1000 MG/10ML IJ SOLN
500.0000 mg | Freq: Once | INTRAVENOUS | Status: DC
  Filled 2015-11-25: qty 5

## 2015-11-25 MED ORDER — ACETAMINOPHEN 325 MG PO TABS: 650.0000 mg | ORAL_TABLET | ORAL | Status: DC | PRN

## 2015-11-25 MED ORDER — PHENOL 1.4 % MT LIQD
1.0000 | OROMUCOSAL | Status: DC | PRN
Start: 2015-11-25 — End: 2015-11-27

## 2015-11-25 MED ORDER — ESCITALOPRAM OXALATE 10 MG PO TABS
10.0000 mg | ORAL_TABLET | Freq: Every day | ORAL | Status: DC
  Administered 2015-11-26 – 2015-11-27 (×2): 10 mg via ORAL
  Filled 2015-11-25 (×2): qty 1

## 2015-11-25 MED ORDER — SUGAMMADEX SODIUM 200 MG/2ML IV SOLN
INTRAVENOUS | Status: DC | PRN
  Administered 2015-11-25: 150 mg via INTRAVENOUS

## 2015-11-25 MED ORDER — 0.9 % SODIUM CHLORIDE (POUR BTL) OPTIME
TOPICAL | Status: DC | PRN
  Administered 2015-11-25: 1000 mL

## 2015-11-25 MED ORDER — VANCOMYCIN HCL 1000 MG IV SOLR
INTRAVENOUS | Status: AC
  Filled 2015-11-25: qty 1000

## 2015-11-25 MED ORDER — TRAMADOL HCL 50 MG PO TABS: 100.0000 mg | ORAL_TABLET | Freq: Four times a day (QID) | ORAL | Status: DC

## 2015-11-25 MED ORDER — BISACODYL 10 MG RE SUPP: 10.0000 mg | Freq: Every day | RECTAL | Status: DC | PRN

## 2015-11-25 MED ORDER — PHENYLEPHRINE 40 MCG/ML (10ML) SYRINGE FOR IV PUSH (FOR BLOOD PRESSURE SUPPORT)
PREFILLED_SYRINGE | INTRAVENOUS | Status: AC
  Filled 2015-11-25: qty 10

## 2015-11-25 MED ORDER — ROCURONIUM BROMIDE 100 MG/10ML IV SOLN
INTRAVENOUS | Status: DC | PRN
  Administered 2015-11-25: 50 mg via INTRAVENOUS

## 2015-11-25 MED ORDER — PROPOFOL 10 MG/ML IV BOLUS
INTRAVENOUS | Status: AC
  Filled 2015-11-25: qty 20

## 2015-11-25 MED ORDER — LACTATED RINGERS IV SOLN: INTRAVENOUS | Status: DC

## 2015-11-25 MED ORDER — PROPOFOL 10 MG/ML IV BOLUS
INTRAVENOUS | Status: DC | PRN
  Administered 2015-11-25: 140 mg via INTRAVENOUS

## 2015-11-25 MED ORDER — DEXTROSE 5 % IV SOLN
10.0000 mg | INTRAVENOUS | Status: DC | PRN
  Administered 2015-11-25: 30 ug/min via INTRAVENOUS

## 2015-11-25 MED ORDER — ALUM & MAG HYDROXIDE-SIMETH 200-200-20 MG/5ML PO SUSP: 30.0000 mL | Freq: Four times a day (QID) | ORAL | Status: DC | PRN

## 2015-11-25 MED ORDER — EPHEDRINE SULFATE 50 MG/ML IJ SOLN
INTRAMUSCULAR | Status: DC | PRN
  Administered 2015-11-25: 15 mg via INTRAVENOUS
  Administered 2015-11-25: 10 mg via INTRAVENOUS
  Administered 2015-11-25: 15 mg via INTRAVENOUS
  Administered 2015-11-25: 10 mg via INTRAVENOUS

## 2015-11-25 MED ORDER — EPHEDRINE 5 MG/ML INJ
INTRAVENOUS | Status: AC
  Filled 2015-11-25: qty 10

## 2015-11-25 MED ORDER — ATENOLOL 50 MG PO TABS
50.0000 mg | ORAL_TABLET | Freq: Every day | ORAL | Status: DC
  Filled 2015-11-25 (×2): qty 1

## 2015-11-25 MED ORDER — FLUTICASONE PROPIONATE 50 MCG/ACT NA SUSP
1.0000 | Freq: Every day | NASAL | Status: DC
  Administered 2015-11-26 – 2015-11-27 (×2): 1 via NASAL
  Filled 2015-11-25: qty 16

## 2015-11-25 MED ORDER — BUPIVACAINE-EPINEPHRINE (PF) 0.5% -1:200000 IJ SOLN
INTRAMUSCULAR | Status: DC | PRN
  Administered 2015-11-25: 10 mL via PERINEURAL

## 2015-11-25 MED ORDER — BACITRACIN ZINC 500 UNIT/GM EX OINT
TOPICAL_OINTMENT | CUTANEOUS | Status: DC | PRN
  Administered 2015-11-25: 1 via TOPICAL

## 2015-11-25 MED ORDER — MENTHOL 3 MG MT LOZG: 1.0000 | LOZENGE | OROMUCOSAL | Status: DC | PRN

## 2015-11-25 MED ORDER — ACETAMINOPHEN 650 MG RE SUPP: 650.0000 mg | RECTAL | Status: DC | PRN

## 2015-11-25 MED ORDER — CEFAZOLIN SODIUM-DEXTROSE 2-4 GM/100ML-% IV SOLN
2.0000 g | Freq: Three times a day (TID) | INTRAVENOUS | Status: AC
  Administered 2015-11-25 (×2): 2 g via INTRAVENOUS
  Filled 2015-11-25 (×2): qty 100

## 2015-11-25 MED ORDER — OXYCODONE HCL 5 MG/5ML PO SOLN: 5.0000 mg | Freq: Once | ORAL | Status: DC | PRN

## 2015-11-25 MED ORDER — ESTROGENS CONJUGATED 0.625 MG PO TABS
0.6250 mg | ORAL_TABLET | Freq: Every day | ORAL | Status: DC
  Administered 2015-11-26 – 2015-11-27 (×2): 0.625 mg via ORAL
  Filled 2015-11-25 (×2): qty 1

## 2015-11-25 MED ORDER — DOCUSATE SODIUM 100 MG PO CAPS
100.0000 mg | ORAL_CAPSULE | Freq: Two times a day (BID) | ORAL | Status: DC
  Administered 2015-11-25 – 2015-11-27 (×4): 100 mg via ORAL
  Filled 2015-11-25 (×3): qty 1

## 2015-11-25 MED ORDER — OXYCODONE-ACETAMINOPHEN 5-325 MG PO TABS
1.0000 | ORAL_TABLET | ORAL | Status: DC | PRN
  Administered 2015-11-25 – 2015-11-26 (×4): 2 via ORAL
  Administered 2015-11-26 – 2015-11-27 (×3): 1 via ORAL
  Filled 2015-11-25: qty 2
  Filled 2015-11-25: qty 1
  Filled 2015-11-25: qty 2
  Filled 2015-11-25 (×2): qty 1
  Filled 2015-11-25 (×2): qty 2

## 2015-11-25 MED ORDER — TRIAMTERENE-HCTZ 37.5-25 MG PO TABS
1.0000 | ORAL_TABLET | Freq: Every day | ORAL | Status: DC
Start: 2015-11-25 — End: 2015-11-27
  Filled 2015-11-25 (×3): qty 1

## 2015-11-25 MED ORDER — ONDANSETRON HCL 4 MG/2ML IJ SOLN
INTRAMUSCULAR | Status: DC | PRN
  Administered 2015-11-25: 4 mg via INTRAVENOUS

## 2015-11-25 MED ORDER — PANTOPRAZOLE SODIUM 40 MG PO TBEC
40.0000 mg | DELAYED_RELEASE_TABLET | Freq: Every day | ORAL | Status: DC
  Administered 2015-11-26 – 2015-11-27 (×2): 40 mg via ORAL
  Filled 2015-11-25 (×3): qty 1

## 2015-11-25 MED ORDER — VANCOMYCIN HCL 1000 MG IV SOLR
INTRAVENOUS | Status: DC | PRN
  Administered 2015-11-25: 1000 mg

## 2015-11-25 MED ORDER — DIAZEPAM 5 MG PO TABS
5.0000 mg | ORAL_TABLET | Freq: Four times a day (QID) | ORAL | Status: DC | PRN
  Administered 2015-11-26: 5 mg via ORAL
  Filled 2015-11-25: qty 1

## 2015-11-25 MED ORDER — MORPHINE SULFATE (PF) 2 MG/ML IV SOLN: 1.0000 mg | INTRAVENOUS | Status: DC | PRN

## 2015-11-25 MED ORDER — ONDANSETRON HCL 4 MG/2ML IJ SOLN: 4.0000 mg | INTRAMUSCULAR | Status: DC | PRN

## 2015-11-25 MED ORDER — NALOXONE HCL 0.4 MG/ML IJ SOLN
INTRAMUSCULAR | Status: DC | PRN
  Administered 2015-11-25 (×2): 40 ug via INTRAVENOUS

## 2015-11-25 MED ORDER — ROCURONIUM BROMIDE 50 MG/5ML IV SOLN
INTRAVENOUS | Status: AC
  Filled 2015-11-25: qty 1

## 2015-11-25 MED ORDER — LACTATED RINGERS IV SOLN
INTRAVENOUS | Status: DC | PRN
  Administered 2015-11-25 (×2): via INTRAVENOUS

## 2015-11-25 MED ORDER — SODIUM CHLORIDE 0.9 % IR SOLN
Status: DC | PRN
  Administered 2015-11-25: 07:00:00

## 2015-11-25 MED ORDER — PROMETHAZINE HCL 25 MG/ML IJ SOLN: 6.2500 mg | INTRAMUSCULAR | Status: DC | PRN

## 2015-11-25 MED ORDER — LEFLUNOMIDE 20 MG PO TABS
10.0000 mg | ORAL_TABLET | Freq: Every day | ORAL | Status: DC
  Administered 2015-11-26 – 2015-11-27 (×2): 10 mg via ORAL
  Filled 2015-11-25 (×2): qty 1

## 2015-11-25 MED ORDER — LIDOCAINE HCL (CARDIAC) 20 MG/ML IV SOLN
INTRAVENOUS | Status: DC | PRN
  Administered 2015-11-25: 100 mg via INTRAVENOUS

## 2015-11-25 MED FILL — Sodium Chloride IV Soln 0.9%: INTRAVENOUS | Qty: 1000 | Status: AC

## 2015-11-25 MED FILL — Heparin Sodium (Porcine) Inj 1000 Unit/ML: INTRAMUSCULAR | Qty: 30 | Status: AC

## 2015-11-25 SURGICAL SUPPLY — 61 items
BAG DECANTER FOR FLEXI CONT (MISCELLANEOUS) ×2 IMPLANT
BENZOIN TINCTURE PRP APPL 2/3 (GAUZE/BANDAGES/DRESSINGS) ×2 IMPLANT
BLADE CLIPPER SURG (BLADE) IMPLANT
BRUSH SCRUB EZ PLAIN DRY (MISCELLANEOUS) ×2 IMPLANT
BUR MATCHSTICK NEURO 3.0 LAGG (BURR) ×2 IMPLANT
BUR PRECISION FLUTE 6.0 (BURR) ×4 IMPLANT
CAGE ALTERA 10X31X9-13 15D (Cage) ×2 IMPLANT
CANISTER SUCT 3000ML PPV (MISCELLANEOUS) ×2 IMPLANT
CAP REVERE LOCKING (Cap) ×8 IMPLANT
CONT SPEC 4OZ CLIKSEAL STRL BL (MISCELLANEOUS) ×2 IMPLANT
COVER BACK TABLE 60X90IN (DRAPES) ×2 IMPLANT
DRAPE C-ARM 42X72 X-RAY (DRAPES) ×4 IMPLANT
DRAPE LAPAROTOMY 100X72X124 (DRAPES) ×2 IMPLANT
DRAPE POUCH INSTRU U-SHP 10X18 (DRAPES) ×2 IMPLANT
DRAPE PROXIMA HALF (DRAPES) ×2 IMPLANT
DRAPE SURG 17X23 STRL (DRAPES) ×8 IMPLANT
ELECT BLADE 4.0 EZ CLEAN MEGAD (MISCELLANEOUS) ×4
ELECT REM PT RETURN 9FT ADLT (ELECTROSURGICAL) ×2
ELECTRODE BLDE 4.0 EZ CLN MEGD (MISCELLANEOUS) ×2 IMPLANT
ELECTRODE REM PT RTRN 9FT ADLT (ELECTROSURGICAL) ×1 IMPLANT
EVACUATOR 1/8 PVC DRAIN (DRAIN) IMPLANT
GAUZE SPONGE 4X4 12PLY STRL (GAUZE/BANDAGES/DRESSINGS) ×2 IMPLANT
GAUZE SPONGE 4X4 16PLY XRAY LF (GAUZE/BANDAGES/DRESSINGS) ×2 IMPLANT
GLOVE BIO SURGEON STRL SZ8 (GLOVE) ×4 IMPLANT
GLOVE BIO SURGEON STRL SZ8.5 (GLOVE) ×4 IMPLANT
GLOVE EXAM NITRILE LRG STRL (GLOVE) IMPLANT
GLOVE EXAM NITRILE MD LF STRL (GLOVE) IMPLANT
GLOVE EXAM NITRILE XL STR (GLOVE) IMPLANT
GLOVE EXAM NITRILE XS STR PU (GLOVE) IMPLANT
GOWN STRL REUS W/ TWL LRG LVL3 (GOWN DISPOSABLE) ×2 IMPLANT
GOWN STRL REUS W/ TWL XL LVL3 (GOWN DISPOSABLE) ×3 IMPLANT
GOWN STRL REUS W/TWL 2XL LVL3 (GOWN DISPOSABLE) IMPLANT
GOWN STRL REUS W/TWL LRG LVL3 (GOWN DISPOSABLE) ×2
GOWN STRL REUS W/TWL XL LVL3 (GOWN DISPOSABLE) ×3
KIT BASIN OR (CUSTOM PROCEDURE TRAY) ×2 IMPLANT
KIT ROOM TURNOVER OR (KITS) ×2 IMPLANT
MILL MEDIUM DISP (BLADE) ×2 IMPLANT
NEEDLE HYPO 21X1.5 SAFETY (NEEDLE) ×2 IMPLANT
NEEDLE HYPO 22GX1.5 SAFETY (NEEDLE) ×2 IMPLANT
NS IRRIG 1000ML POUR BTL (IV SOLUTION) ×2 IMPLANT
PACK LAMINECTOMY NEURO (CUSTOM PROCEDURE TRAY) ×2 IMPLANT
PAD ARMBOARD 7.5X6 YLW CONV (MISCELLANEOUS) ×6 IMPLANT
PATTIES SURGICAL .5 X.5 (GAUZE/BANDAGES/DRESSINGS) ×2 IMPLANT
PATTIES SURGICAL .5 X1 (DISPOSABLE) IMPLANT
ROD REVERE 6.35 40MM (Rod) ×4 IMPLANT
SCREW 7.5X45MM (Screw) ×4 IMPLANT
SCREW 7.5X50MM (Screw) ×4 IMPLANT
SPONGE LAP 4X18 X RAY DECT (DISPOSABLE) ×2 IMPLANT
SPONGE NEURO XRAY DETECT 1X3 (DISPOSABLE) IMPLANT
SPONGE SURGIFOAM ABS GEL 100 (HEMOSTASIS) ×6 IMPLANT
STRIP BIOACTIVE 20CC 25X100X8 (Miscellaneous) ×2 IMPLANT
STRIP BIOACTIVE 5CC 25X50X4MM (Miscellaneous) ×2 IMPLANT
STRIP CLOSURE SKIN 1/2X4 (GAUZE/BANDAGES/DRESSINGS) ×2 IMPLANT
SUT VIC AB 1 CT1 18XBRD ANBCTR (SUTURE) ×2 IMPLANT
SUT VIC AB 1 CT1 8-18 (SUTURE) ×2
SUT VIC AB 2-0 CP2 18 (SUTURE) ×4 IMPLANT
TAPE CLOTH SURG 4X10 WHT LF (GAUZE/BANDAGES/DRESSINGS) ×2 IMPLANT
TOWEL OR 17X24 6PK STRL BLUE (TOWEL DISPOSABLE) ×2 IMPLANT
TOWEL OR 17X26 10 PK STRL BLUE (TOWEL DISPOSABLE) ×2 IMPLANT
TRAY FOLEY W/METER SILVER 16FR (SET/KITS/TRAYS/PACK) ×2 IMPLANT
WATER STERILE IRR 1000ML POUR (IV SOLUTION) ×2 IMPLANT

## 2015-11-25 NOTE — Op Note (Signed)
Brief history: The patient is a 75 year old white female who has complained of back and right leg pain consistent with neurogenic claudication. She has failed medical management and was worked up with a lumbar MRI and lumbar x-rays. This demonstrated the patient had an L4-5 spondylolisthesis with spinal stenosis, facet arthropathy, etc. I discussed the situation with the patient and reviewed her imaging studies with her. We discussed the various treatment options include surgery. She has weighed the risks, benefits, and alternative surgery and decided to proceed with an L4-5 decompression, instrumentation, and fusion.  Preoperative diagnosis: L4-5 spondylolisthesis,spinal stenosis compressing both the L4 and the L5 nerve roots; lumbago; lumbar radiculopathy; neurogenic claudication  Postoperative diagnosis: The same  Procedure: Bilateral L4-5 Laminotomy/foraminotomies to decompress the bilateral L4 and L5 nerve roots(the work required to do this was in addition to the work required to do the posterior lumbar interbody fusion because of the patient's spinal stenosis, facet arthropathy. Etc. requiring a wide decompression of the nerve roots.); L4-5 transforaminal lumbar interbody fusion with local morselized autograft bone and Kinnex graft extender; insertion of interbody prosthesis at L4-5 (globus peek expandable interbody prosthesis); posterior nonsegmental instrumentation from L4 to L5 with globus titanium pedicle screws and rods; posterior lateral arthrodesis at L4-5 with local morselized autograft bone and Kinnex bone graft extender.  Surgeon: Dr. Earle Gell  Asst.: Dr. Suezanne Jacquet Ditty  Anesthesia: Gen. endotracheal  Estimated blood loss: 200 mL  Drains: None  Complications: None  Description of procedure: The patient was brought to the operating room by the anesthesia team. General endotracheal anesthesia was induced. The patient was turned to the prone position on the Wilson frame. The patient's  lumbosacral region was then prepared with Betadine scrub and Betadine solution. Sterile drapes were applied.  I then injected the area to be incised with Marcaine with epinephrine solution. I then used the scalpel to make a linear midline incision over the L4-5 interspace. I then used electrocautery to perform a bilateral subperiosteal dissection exposing the spinous process and lamina of L4 and L5. We then obtained intraoperative radiograph to confirm our location. We then inserted the Verstrac retractor to provide exposure.  I began the decompression by using the high speed drill to perform laminotomies at L4-5 bilaterally. We then used the Kerrison punches to widen the laminotomy and removed the ligamentum flavum at L4-5 bilaterally. We used the Kerrison punches to remove the medial facets at L4-5 bilaterally. We performed wide foraminotomies about the bilateral L4 and L5 nerve roots completing the decompression.  We now turned our attention to the posterior lumbar interbody fusion. I used a scalpel to incise the intervertebral disc at L4-5 bilaterally. I then performed a partial intervertebral discectomy at L4-5 bilaterally using the pituitary forceps. We prepared the vertebral endplates at 075-GRM bilaterally for the fusion by removing the soft tissues with the curettes. We then used the trial spacers to pick the appropriate sized interbody prosthesis. We prefilled his prosthesis with a combination of local morselized autograft bone that we obtained during the decompression as well as Kinnex bone graft extender. We inserted the prefilled prosthesis into the interspace at L4-5, we then expanded the prosthesis. There was a good snug fit of the prosthesis in the interspace. We then filled and the remainder of the intervertebral disc space with local morselized autograft bone and Kinnex. This completed the posterior lumbar interbody arthrodesis.  We now turned attention to the instrumentation. Under  fluoroscopic guidance we cannulated the bilateral L4 and L5 pedicles with the bone  probe. We then removed the bone probe. We then tapped the pedicle with a 6.5 millimeter tap. We then removed the tap. We probed inside the tapped pedicle with a ball probe to rule out cortical breaches. We then inserted a 7.5 x 45 and 50 millimeter pedicle screw into the L4 and L5 pedicles bilaterally under fluoroscopic guidance. We then palpated along the medial aspect of the pedicles to rule out cortical breaches. There were none. The nerve roots were not injured. We then connected the unilateral pedicle screws with a lordotic rod. We compressed the construct and secured the rod in place with the caps. We then tightened the caps appropriately. This completed the instrumentation from L4-5.  We now turned our attention to the posterior lateral arthrodesis at L4-5 bilaterally. We used the high-speed drill to decorticate the remainder of the facets, pars, transverse process at L4-5 bilaterally. We then applied a combination of local morselized autograft bone and Kinnex bone graft extender over these decorticated posterior lateral structures. This completed the posterior lateral arthrodesis.  We then obtained hemostasis using bipolar electrocautery. We irrigated the wound out with bacitracin solution. We inspected the thecal sac and nerve roots and noted they were well decompressed. We then removed the retractor. We placed vancomycin powder in the wound. We reapproximated patient's thoracolumbar fascia with interrupted #1 Vicryl suture. We reapproximated patient's subcutaneous tissue with interrupted 2-0 Vicryl suture. The reapproximated patient's skin with Steri-Strips and benzoin. The wound was then coated with bacitracin ointment. A sterile dressing was applied. The drapes were removed. The patient was subsequently returned to the supine position where they were extubated by the anesthesia team. He was then transported to the post  anesthesia care unit in stable condition. All sponge instrument and needle counts were reportedly correct at the end of this case.

## 2015-11-25 NOTE — Anesthesia Procedure Notes (Signed)
Procedure Name: Intubation Date/Time: 11/25/2015 7:45 AM Performed by: Lance Coon Pre-anesthesia Checklist: Patient identified, Emergency Drugs available, Suction available, Patient being monitored and Timeout performed Patient Re-evaluated:Patient Re-evaluated prior to inductionOxygen Delivery Method: Circle system utilized Preoxygenation: Pre-oxygenation with 100% oxygen Intubation Type: IV induction Ventilation: Mask ventilation without difficulty Laryngoscope Size: Miller and 2 Grade View: Grade I Tube type: Oral Tube size: 7.0 mm Number of attempts: 1 Airway Equipment and Method: Stylet Placement Confirmation: ETT inserted through vocal cords under direct vision,  positive ETCO2 and breath sounds checked- equal and bilateral Secured at: 21 cm Tube secured with: Tape Dental Injury: Teeth and Oropharynx as per pre-operative assessment

## 2015-11-25 NOTE — Anesthesia Preprocedure Evaluation (Signed)
Anesthesia Evaluation  Patient identified by MRN, date of birth, ID band Patient awake    Reviewed: Allergy & Precautions, H&P , NPO status , Patient's Chart, lab work & pertinent test results, reviewed documented beta blocker date and time   History of Anesthesia Complications Negative for: history of anesthetic complications  Airway Mallampati: II  TM Distance: >3 FB Neck ROM: full    Dental no notable dental hx.    Pulmonary neg pulmonary ROS,    Pulmonary exam normal breath sounds clear to auscultation       Cardiovascular negative cardio ROS Normal cardiovascular exam Rhythm:regular Rate:Normal     Neuro/Psych  Neuromuscular disease    GI/Hepatic Neg liver ROS, GERD  ,  Endo/Other  negative endocrine ROS  Renal/GU Renal disease     Musculoskeletal  (+) Arthritis , Rheumatoid disorders,    Abdominal   Peds  Hematology negative hematology ROS (+)   Anesthesia Other Findings   Reproductive/Obstetrics negative OB ROS                             Anesthesia Physical Anesthesia Plan  ASA: III  Anesthesia Plan: General   Post-op Pain Management:    Induction: Intravenous  Airway Management Planned: Oral ETT  Additional Equipment:   Intra-op Plan:   Post-operative Plan: Extubation in OR  Informed Consent: I have reviewed the patients History and Physical, chart, labs and discussed the procedure including the risks, benefits and alternatives for the proposed anesthesia with the patient or authorized representative who has indicated his/her understanding and acceptance.   Dental Advisory Given  Plan Discussed with: Anesthesiologist, CRNA and Surgeon  Anesthesia Plan Comments: (Patient with multiple myeloma and RA)        Anesthesia Quick Evaluation

## 2015-11-25 NOTE — Transfer of Care (Signed)
Immediate Anesthesia Transfer of Care Note  Patient: Sydney Gillespie  Procedure(s) Performed: Procedure(s): Lumbar four-five Posterior lumbar interbody fusion/Interbody prosthesis/posterior non-segmental instrumentation/posterior lateral arthrodesis (N/A)  Patient Location: PACU  Anesthesia Type:General  Level of Consciousness: awake, alert  and patient cooperative  Airway & Oxygen Therapy: Patient Spontanous Breathing and Patient connected to face mask oxygen  Post-op Assessment: Report given to RN, Post -op Vital signs reviewed and stable and Patient moving all extremities X 4  Post vital signs: Reviewed and stable  Last Vitals:  Filed Vitals:   11/25/15 0648  BP: 152/68  Pulse: 60  Temp: 36.8 C  Resp: 20    Last Pain: There were no vitals filed for this visit.       Complications: No apparent anesthesia complications

## 2015-11-25 NOTE — H&P (Signed)
Subjective: The patient is a 75 year old white female who has complained of back and right leg pain. She has failed medical management and was worked up with a lumbar MRI. This demonstrated the patient had a L4-5 spondylolisthesis and spinal stenosis. I discussed the situation with the patient and reviewed her imaging studies with her. We have discussed the various treatment options including surgery. I have described the surgical treatment option of an L4-5 decompression, instrumentation, and fusion. She has decided to proceed with surgery.   Past Medical History  Diagnosis Date  . Multiple myeloma ([place])   . Lumbar stenosis with neurogenic claudication   . Degenerative disc disease, lumbar   . Lumbar radiculitis   . Hyperlipidemia   . Allergic rhinitis   . Heart palpitations   . Renal insufficiency   . Gout   . Osteoporosis   . Compression fracture 2013  . Barrett's esophagus   . 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[place])   . Rheumatoid arthritis ([place])   . Cancer ([place])   . Rheumatoid arthritis ([place])   . Allergy     Past Surgical History  Procedure Laterality Date  . Appendectomy    . Cholecystectomy      Allergies  Allergen Reactions  . Isoniazid Other (See Comments)    Blisters     Social History  Substance Use Topics  . Smoking status: Never Smoker   . Smokeless tobacco: Never Used  . Alcohol Use: No    Family History  Problem Relation Age of Onset  . Cancer Father    Prior to Admission medications   Medication Sig Start Date End Date Taking? Authorizing Provider  allopurinol (ZYLOPRIM) 300 MG tablet Take 300 mg by mouth daily.  09/14/14  Yes Historical Provider, MD  ALPRAZolam Duanne Moron) 0.25 MG tablet Take 0.25 mg by mouth 2 (two) times daily as needed. 10/18/15  Yes Historical Provider, MD  atenolol (TENORMIN) 50 MG tablet Take 50 mg by mouth daily.  10/17/13  Yes Historical Provider, MD  cetirizine (ZYRTEC) 10 MG tablet Take 10 mg by mouth 2 (two) times daily.    Yes Historical  Provider, MD  escitalopram (LEXAPRO) 10 MG tablet Take 10 mg by mouth daily. 10/18/15  Yes Historical Provider, MD  estrogens, conjugated, (PREMARIN) 0.625 MG tablet Take 0.625 mg by mouth daily.    Yes Historical Provider, MD  fexofenadine (ALLEGRA) 180 MG tablet Take 180 mg by mouth 2 (two) times daily.   Yes Historical Provider, MD  gabapentin (NEURONTIN) 300 MG capsule 1 po at AM, 1 at Lunch, and 2 tablets bedtime 01/30/14  Yes Historical Provider, MD  hydroxychloroquine (PLAQUENIL) 200 MG tablet TAKE 2 TABLETS BY MOUTH DAILY 08/20/14  Yes Historical Provider, MD  lansoprazole (PREVACID) 30 MG capsule Take 30 mg by mouth daily at 12 noon.    Yes Historical Provider, MD  leflunomide (ARAVA) 10 MG tablet Take 10 tablets by mouth daily. 04/14/15  Yes Historical Provider, MD  traMADol (ULTRAM) 50 MG tablet TAKE 1 TABLET BY MOUTH FOUR TIMES DAILY AS NEEDED 10/03/14  Yes Historical Provider, MD  triamterene-hydrochlorothiazide (MAXZIDE-25) 37.5-25 MG per tablet TAKE 1 TABLET BY MOUTH DAILY AS NEEDED 08/20/14  Yes Historical Provider, MD  diclofenac sodium (VOLTAREN) 1 % GEL Apply 2 g topically 4 (four) times daily.     Historical Provider, MD  fluticasone (FLONASE) 50 MCG/ACT nasal spray Place 2 sprays into the nose daily.     Historical Provider, MD     Review of Systems  Positive ROS: As above  All other systems have been reviewed and were otherwise negative with the exception of those mentioned in the HPI and as above.  Objective: Vital signs in last 24 hours: Temp:  [98.2 F (36.8 C)] 98.2 F (36.8 C) (07/17 0648) Pulse Rate:  [60] 60 (07/17 0648) Resp:  [20] 20 (07/17 0648) BP: (152)/(68) 152/68 mmHg (07/17 0648) SpO2:  [99 %] 99 % (07/17 0648) Weight:  [75.297 kg (166 lb)] 75.297 kg (166 lb) (07/17 0648)  General Appearance: Alert, cooperative, no distress, Head: Normocephalic, without obvious abnormality, atraumatic Eyes: PERRL, conjunctiva/corneas clear, EOM's intact,    Ears: Normal   Throat: Normal  Neck: Supple, symmetrical, trachea midline, no adenopathy; thyroid: No enlargement/tenderness/nodules; no carotid bruit or JVD Back: Symmetric, no curvature, ROM normal, no CVA tenderness Lungs: Clear to auscultation bilaterally, respirations unlabored Heart: Regular rate and rhythm, no murmur, rub or gallop Abdomen: Soft, non-tender,, no masses, no organomegaly Extremities: Extremities normal, atraumatic, no cyanosis or edema Pulses: 2+ and symmetric all extremities Skin: Skin color, texture, turgor normal, no rashes or lesions  NEUROLOGIC:   Mental status: alert and oriented, no aphasia, good attention span, Fund of knowledge/ memory ok Motor Exam - grossly normal Sensory Exam - grossly normal Reflexes:  Coordination - grossly normal Gait - grossly normal Balance - grossly normal Cranial Nerves: I: smell Not tested  II: visual acuity  OS: Normal  OD: Normal   II: visual fields Full to confrontation  II: pupils Equal, round, reactive to light  III,VII: ptosis None  III,IV,VI: extraocular muscles  Full ROM  V: mastication Normal  V: facial light touch sensation  Normal  V,VII: corneal reflex  Present  VII: facial muscle function - upper  Normal  VII: facial muscle function - lower Normal  VIII: hearing Not tested  IX: soft palate elevation  Normal  IX,X: gag reflex Present  XI: trapezius strength  5/5  XI: sternocleidomastoid strength 5/5  XI: neck flexion strength  5/5  XII: tongue strength  Normal    Data Review Lab Results  Component Value Date   WBC 7.4 11/15/2015   HGB 11.6* 11/15/2015   HCT 36.3 11/15/2015   MCV 96.8 11/15/2015   PLT 181 11/15/2015   Lab Results  Component Value Date   NA 137 11/15/2015   K 4.2 11/15/2015   CL 107 11/15/2015   CO2 24 11/15/2015   BUN 30* 11/15/2015   CREATININE 1.55* 11/15/2015   GLUCOSE 89 11/15/2015   No results found for: INR, PROTIME  Assessment/Plan: L4-5 spondylolisthesis, spinal stenosis,  lumbago, lumbar radiculopathy, neurogenic claudication: I have discussed the situation with the patient. I have reviewed her imaging studies with her. We have discussed the various treatment options including surgery. I have described the surgical treatment options and L4-5 decompression, instrumentation, and fusion. I have shown her surgical models. We have discussed the risks, benefits, alternatives, expected postoperative course, and likelihood of achieving our goals of surgery. I have answered all the patient's questions. He has decided to proceed with surgery.   Anevay Campanella D 11/25/2015 7:20 AM

## 2015-11-25 NOTE — Anesthesia Postprocedure Evaluation (Signed)
Anesthesia Post Note  Patient: Sydney Gillespie  Procedure(s) Performed: Procedure(s) (LRB): Lumbar four-five Posterior lumbar interbody fusion/Interbody prosthesis/posterior non-segmental instrumentation/posterior lateral arthrodesis (N/A)  Patient location during evaluation: PACU Anesthesia Type: General Level of consciousness: awake and alert Pain management: pain level controlled Vital Signs Assessment: post-procedure vital signs reviewed and stable Respiratory status: spontaneous breathing, nonlabored ventilation, respiratory function stable and patient connected to nasal cannula oxygen Cardiovascular status: blood pressure returned to baseline and stable Postop Assessment: no signs of nausea or vomiting Anesthetic complications: no    Last Vitals:  Filed Vitals:   11/25/15 1130 11/25/15 1145  BP: 121/58   Pulse: 66 63  Temp:    Resp: 14 19    Last Pain: There were no vitals filed for this visit.               Zenaida Deed

## 2015-11-25 NOTE — Progress Notes (Signed)
Subjective:  The patient is somnolent but easily arousable. She is in no apparent distress.  Objective: Vital signs in last 24 hours: Temp:  [96.8 F (36 C)-98.2 F (36.8 C)] 96.8 F (36 C) (07/17 1111) Pulse Rate:  [60-64] 64 (07/17 1112) Resp:  [20-39] 39 (07/17 1112) BP: (125-152)/(56-68) 125/56 mmHg (07/17 1112) SpO2:  [99 %-100 %] 100 % (07/17 1112) Weight:  [75.297 kg (166 lb)] 75.297 kg (166 lb) (07/17 0648)  Intake/Output from previous day:   Intake/Output this shift: Total I/O In: 1500 [I.V.:1500] Out: 375 [Urine:275; Blood:100]  Physical exam the patient is somnolent but arousable. She is moving her lower extremities well.  Lab Results: No results for input(s): WBC, HGB, HCT, PLT in the last 72 hours. BMET No results for input(s): NA, K, CL, CO2, GLUCOSE, BUN, CREATININE, CALCIUM in the last 72 hours.  Studies/Results: Dg Lumbar Spine 1 View  11/25/2015  CLINICAL DATA:  Posterior fusion of L4-5. EXAM: LUMBAR SPINE - 1 VIEW COMPARISON:  MRI of Sep 13, 2015. FINDINGS: Single intraoperative cross-table lateral projection of the lumbar spine demonstrates surgical probe directed toward the posterior margin of L4-5 disc space. IMPRESSION: Surgical localization as described above. Electronically Signed   By: Marijo Conception, M.D.   On: 11/25/2015 09:33    Assessment/Plan: The patient is doing well.  LOS: 0 days     Sydney Gillespie D 11/25/2015, 11:18 AM

## 2015-11-25 NOTE — Progress Notes (Signed)
Per patient all blood irradiated and leucocyte reduced. Per blood bank irradiated blood comes from [facility] and there may be a delay in getting if needed. Dr. Arnoldo Morale notified and advised that he did not need any units prepared.

## 2015-11-26 LAB — BASIC METABOLIC PANEL
Anion gap: 8 (ref 5–15)
BUN: 30 mg/dL — ABNORMAL HIGH (ref 6–20)
CO2: 24 mmol/L (ref 22–32)
Calcium: 8.2 mg/dL — ABNORMAL LOW (ref 8.9–10.3)
Chloride: 106 mmol/L (ref 101–111)
Creatinine, Ser: 1.81 mg/dL — ABNORMAL HIGH (ref 0.44–1.00)
GFR calc Af Amer: 30 mL/min — ABNORMAL LOW (ref >60)
GFR calc non Af Amer: 26 mL/min — ABNORMAL LOW (ref >60)
Glucose, Bld: 132 mg/dL — ABNORMAL HIGH (ref 65–99)
Potassium: 4 mmol/L (ref 3.5–5.1)
Sodium: 138 mmol/L (ref 135–145)

## 2015-11-26 LAB — CBC
HCT: 31.7 % — ABNORMAL LOW (ref 36.0–46.0)
Hemoglobin: 10 g/dL — ABNORMAL LOW (ref 12.0–15.0)
MCH: 30.4 pg (ref 26.0–34.0)
MCHC: 31.5 g/dL (ref 30.0–36.0)
MCV: 96.4 fL (ref 78.0–100.0)
Platelets: 152 10*3/uL (ref 150–400)
RBC: 3.29 MIL/uL — ABNORMAL LOW (ref 3.87–5.11)
RDW: 13.3 % (ref 11.5–15.5)
WBC: 11.8 10*3/uL — ABNORMAL HIGH (ref 4.0–10.5)

## 2015-11-26 NOTE — Evaluation (Signed)
Physical Therapy Evaluation Patient Details Name: Sydney Gillespie MRN: [id number] DOB: 1941-05-10 Today's Date: 11/26/2015   History of Present Illness  Pt is a 75 y/o female who presents s/p L4-L5 PLIF on 11/25/15.   Clinical Impression  Pt admitted with above diagnosis. Pt currently with functional limitations due to the deficits listed below (see PT Problem List). At the time of PT eval pt was able to perform transfers and ambulation with min assist for balance support and safety. Pt with orthostatic hypotension which limited mobility this session. Pt will benefit from skilled PT to increase their independence and safety with mobility to allow discharge to the venue listed below.       Follow Up Recommendations Home health PT;Supervision for mobility/OOB    Equipment Recommendations  3in1 (PT) (vs. shower seat)    Recommendations for Other Services       Precautions / Restrictions Precautions Precautions: Fall;Back Precaution Booklet Issued: Yes (comment) Precaution Comments: reviewed handout and pt was cued for precautions during functional mobility.  Required Braces or Orthoses: Spinal Brace Spinal Brace: Lumbar corset;Applied in sitting position Restrictions Weight Bearing Restrictions: No      Mobility  Bed Mobility Overal bed mobility: Needs Assistance Bed Mobility: Rolling;Sidelying to Sit;Sit to Sidelying Rolling: Supervision Sidelying to sit: Min guard     Sit to sidelying: Min guard General bed mobility comments: Hands-on guarding as pt transitioned to/from EOB. HOB was flat and rails lowered to simulate home environment. Increased time required to complete each transition.   Transfers Overall transfer level: Needs assistance Equipment used: 1 person hand held assist Transfers: Sit to/from Stand Sit to Stand: Min assist         General transfer comment: Heavy min assist for balance as pt powered-up to full standing position. HHA provided for stability.    Ambulation/Gait Ambulation/Gait assistance: Min assist;+2 safety/equipment Ambulation Distance (Feet): 50 Feet Assistive device: 2 person hand held assist Gait Pattern/deviations: Step-through pattern;Decreased stride length;Trunk flexed Gait velocity: Decreased Gait velocity interpretation: Below normal speed for age/gender General Gait Details: Pt became very dizzy as she reached the doorway of the room (~25 feet). Pt reports seeing purple spots and became very weak. NT and PT assisted pt back to sitting EOB where vitals were taken. After sitting ~1 minute BP was 81/63. Pt was returned to supine and RN notified.   Stairs            Wheelchair Mobility    Modified Rankin (Stroke Patients Only)       Balance Overall balance assessment: Needs assistance Sitting-balance support: Feet supported;No upper extremity supported Sitting balance-Leahy Scale: Fair     Standing balance support: No upper extremity supported;During functional activity Standing balance-Leahy Scale: Poor Standing balance comment: Required UE support for balance support.                              Pertinent Vitals/Pain Pain Assessment: Faces Faces Pain Scale: Hurts little more Pain Location: Incision site Pain Descriptors / Indicators: Operative site guarding;Sore Pain Intervention(s): Limited activity within patient's tolerance;Monitored during session;Repositioned    Home Living Family/patient expects to be discharged to:: Private residence Living Arrangements: Spouse/significant other;Other relatives (Granddaughter and her husband) Available Help at Discharge: Family;Available 24 hours/day Type of Home: House                Prior Function Level of Independence: Independent  Hand Dominance   Dominant Hand: Right    Extremity/Trunk Assessment   Upper Extremity Assessment: Overall WFL for tasks assessed           Lower Extremity Assessment:  Generalized weakness      Cervical / Trunk Assessment: Other exceptions  Communication   Communication: No difficulties  Cognition Arousal/Alertness: Awake/alert Behavior During Therapy: WFL for tasks assessed/performed Overall Cognitive Status: Within Functional Limits for tasks assessed                      General Comments      Exercises        Assessment/Plan    PT Assessment Patient needs continued PT services  PT Diagnosis Difficulty walking;Acute pain   PT Problem List Decreased strength;Decreased range of motion;Decreased activity tolerance;Decreased balance;Decreased mobility;Decreased knowledge of use of DME;Decreased knowledge of precautions;Decreased safety awareness;Pain  PT Treatment Interventions DME instruction;Gait training;Stair training;Functional mobility training;Therapeutic activities;Therapeutic exercise;Neuromuscular re-education;Patient/family education   PT Goals (Current goals can be found in the Care Plan section) Acute Rehab PT Goals PT Goal Formulation: With patient Time For Goal Achievement: 12/03/15 Potential to Achieve Goals: Good    Frequency Min 5X/week   Barriers to discharge        Co-evaluation               End of Session Equipment Utilized During Treatment: Gait belt;Oxygen Activity Tolerance: Treatment limited secondary to medical complications (Comment) (Orthostatic hypotension) Patient left: in bed;with call bell/phone within reach Nurse Communication: Mobility status;Other (comment) (BP status)         Time: [id number] PT Time Calculation (min) (ACUTE ONLY): 23 min   Charges:   PT Evaluation $PT Eval Moderate Complexity: 1 Procedure PT Treatments $Gait Training: 8-22 mins   PT G Codes:        Rolinda Roan 11-28-2015, 11:03 AM   Rolinda Roan, PT, DPT Acute Rehabilitation Services Pager: [phone]

## 2015-11-26 NOTE — Progress Notes (Signed)
Patient got dizzy while participating with therapy. Vital signs taken and BP at 81/63. MD notified and ordered to increase activities slowly. Patient assisted back to bed and checked on an hour later and voiced she is in no distress and ok. Will continue to monitor.

## 2015-11-26 NOTE — Progress Notes (Signed)
Patient ID: Sydney Gillespie, female   DOB: 20-Aug-1940, 75 y.o.   MRN: [id number] Subjective:  The patient is alert and pleasant. She looks well. She is appropriately sore. She is pleased that she is able to walk without right leg pain since surgery.  Objective: Vital signs in last 24 hours: Temp:  [96.8 F (36 C)-100.3 F (37.9 C)] 100.3 F (37.9 C) (07/18 0355) Pulse Rate:  [61-95] 95 (07/18 0355) Resp:  [12-39] 18 (07/18 0355) BP: (110-129)/(48-60) 110/48 mmHg (07/18 0355) SpO2:  [93 %-100 %] 93 % (07/18 0355)  Intake/Output from previous day: 07/17 0701 - 07/18 0700 In: 1860 [P.O.:360; I.V.:1500] Out: 1375 [Urine:1275; Blood:100] Intake/Output this shift:    Physical exam the patient is alert and oriented. She is moving her lower extremities well. Her dressing is clean and dry.  Lab Results:  Recent Labs  11/26/15 0013  WBC 11.8*  HGB 10.0*  HCT 31.7*  PLT 152   BMET  Recent Labs  11/26/15 0013  NA 138  K 4.0  CL 106  CO2 24  GLUCOSE 132*  BUN 30*  CREATININE 1.81*  CALCIUM 8.2*    Studies/Results: Dg Lumbar Spine 2-3 Views  11/25/2015  CLINICAL DATA:  Posterior fusion of L4-5. EXAM: DG C-ARM 61-120 MIN; LUMBAR SPINE - 2-3 VIEW FLUOROSCOPY TIME:  28 seconds. COMPARISON:  Radiograph of same day. FINDINGS: Two intraoperative fluoroscopic images of the lower lumbar spine demonstrate the patient be status post surgical posterior fusion of L4-5 with bilateral intrapedicular screw placement and interbody fusion. Good alignment of vertebral bodies is noted. IMPRESSION: Status post surgical posterior fusion of L4-5. Electronically Signed   By: Marijo Conception, M.D.   On: 11/25/2015 12:43   Dg Lumbar Spine 1 View  11/25/2015  CLINICAL DATA:  Posterior fusion of L4-5. EXAM: LUMBAR SPINE - 1 VIEW COMPARISON:  MRI of Sep 13, 2015. FINDINGS: Single intraoperative cross-table lateral projection of the lumbar spine demonstrates surgical probe directed toward the posterior  margin of L4-5 disc space. IMPRESSION: Surgical localization as described above. Electronically Signed   By: Marijo Conception, M.D.   On: 11/25/2015 09:33   Dg C-arm 1-60 Min  11/25/2015  CLINICAL DATA:  Posterior fusion of L4-5. EXAM: DG C-ARM 61-120 MIN; LUMBAR SPINE - 2-3 VIEW FLUOROSCOPY TIME:  28 seconds. COMPARISON:  Radiograph of same day. FINDINGS: Two intraoperative fluoroscopic images of the lower lumbar spine demonstrate the patient be status post surgical posterior fusion of L4-5 with bilateral intrapedicular screw placement and interbody fusion. Good alignment of vertebral bodies is noted. IMPRESSION: Status post surgical posterior fusion of L4-5. Electronically Signed   By: Marijo Conception, M.D.   On: 11/25/2015 12:43    Assessment/Plan: Postop day #1: The patient is doing well. We will tentatively plan to send her home tomorrow. We will continue to mobilize her with PT.  LOS: 1 day     Geary Rufo D 11/26/2015, 7:37 AM

## 2015-11-27 MED ORDER — OXYCODONE-ACETAMINOPHEN 5-325 MG PO TABS: 1.0000 | ORAL_TABLET | ORAL | Status: DC | PRN

## 2015-11-27 MED ORDER — CYCLOBENZAPRINE HCL 10 MG PO TABS: 10.0000 mg | ORAL_TABLET | Freq: Three times a day (TID) | ORAL | Status: DC | PRN

## 2015-11-27 MED ORDER — DOCUSATE SODIUM 100 MG PO CAPS: 100.0000 mg | ORAL_CAPSULE | Freq: Two times a day (BID) | ORAL | Status: DC

## 2015-11-27 NOTE — Discharge Summary (Signed)
Physician Discharge Summary  Patient ID: Sydney Gillespie MRN: [id number] DOB/AGE: 1940-10-13 75 y.o.  Admit date: 11/25/2015 Discharge date: 11/27/2015  Admission Diagnoses:L4-5 spondylolisthesis, spinal stenosis, facet arthropathy, lumbago, lumbar radiculopathy, neurogenic claudication  Discharge Diagnoses: The same Active Problems:   Spondylolisthesis of lumbar region   Discharged Condition: good  Hospital Course: I performed an L4-5 decompression, instrumentation, and fusion on the patient on 11/25/2015. The surgery went well.  The patient's postoperative course was unremarkable. On postoperative day #2 the patient requested discharge to home. She was given written and oral discharge instructions. All her questions were answered.  Consults: Physical therapy Significant Diagnostic Studies: None Treatments: L4-5 decompression, instrumentation, and fusion. Discharge Exam: Blood pressure 116/86, pulse 83, temperature 98.3 F (36.8 C), temperature source Oral, resp. rate 18, height 5\' 7"  (1.702 m), weight 75.297 kg (166 lb), SpO2 97 %. The patient is alert and pleasant. Her dressing is clean and dry. Her strength is normal. She looks well.  Disposition: Home  Discharge Instructions    Call MD for:  difficulty breathing, headache or visual disturbances    Complete by:  As directed      Call MD for:  extreme fatigue    Complete by:  As directed      Call MD for:  hives    Complete by:  As directed      Call MD for:  persistant dizziness or light-headedness    Complete by:  As directed      Call MD for:  persistant nausea and vomiting    Complete by:  As directed      Call MD for:  redness, tenderness, or signs of infection (pain, swelling, redness, odor or green/yellow discharge around incision site)    Complete by:  As directed      Call MD for:  severe uncontrolled pain    Complete by:  As directed      Call MD for:  temperature >100.4    Complete by:  As directed      Diet - low sodium heart healthy    Complete by:  As directed      Discharge instructions    Complete by:  As directed   Call [phone] for a followup appointment. Take a stool softener while you are using pain medications.     Driving Restrictions    Complete by:  As directed   Do not drive for 2 weeks.     Increase activity slowly    Complete by:  As directed      Lifting restrictions    Complete by:  As directed   Do not lift more than 5 pounds. No excessive bending or twisting.     May shower / Bathe    Complete by:  As directed   He may shower after the pain she is removed 3 days after surgery. Leave the incision alone.     Remove dressing in 24 hours    Complete by:  As directed             Medication List    STOP taking these medications        diclofenac sodium 1 % Gel  Commonly known as:  VOLTAREN      TAKE these medications        allopurinol 300 MG tablet  Commonly known as:  ZYLOPRIM  Take 300 mg by mouth daily.     ALPRAZolam 0.25 MG tablet  Commonly known as:  Duanne Moron  Take 0.25 mg by mouth 2 (two) times daily as needed.     atenolol 50 MG tablet  Commonly known as:  TENORMIN  Take 50 mg by mouth daily.     cetirizine 10 MG tablet  Commonly known as:  ZYRTEC  Take 10 mg by mouth 2 (two) times daily.     cyclobenzaprine 10 MG tablet  Commonly known as:  FLEXERIL  Take 1 tablet (10 mg total) by mouth 3 (three) times daily as needed for muscle spasms.     docusate sodium 100 MG capsule  Commonly known as:  COLACE  Take 1 capsule (100 mg total) by mouth 2 (two) times daily.     escitalopram 10 MG tablet  Commonly known as:  LEXAPRO  Take 10 mg by mouth daily.     estrogens (conjugated) 0.625 MG tablet  Commonly known as:  PREMARIN  Take 0.625 mg by mouth daily.     fexofenadine 180 MG tablet  Commonly known as:  ALLEGRA  Take 180 mg by mouth 2 (two) times daily.     fluticasone 50 MCG/ACT nasal spray  Commonly known as:  FLONASE  Place 2  sprays into the nose daily.     gabapentin 300 MG capsule  Commonly known as:  NEURONTIN  1 po at AM, 1 at Lunch, and 2 tablets bedtime     hydroxychloroquine 200 MG tablet  Commonly known as:  PLAQUENIL  TAKE 2 TABLETS BY MOUTH DAILY     lansoprazole 30 MG capsule  Commonly known as:  PREVACID  Take 30 mg by mouth daily at 12 noon.     leflunomide 10 MG tablet  Commonly known as:  ARAVA  Take 10 tablets by mouth daily.     oxyCODONE-acetaminophen 5-325 MG tablet  Commonly known as:  PERCOCET/ROXICET  Take 1-2 tablets by mouth every 4 (four) hours as needed for moderate pain.     traMADol 50 MG tablet  Commonly known as:  ULTRAM  TAKE 1 TABLET BY MOUTH FOUR TIMES DAILY AS NEEDED     triamterene-hydrochlorothiazide 37.5-25 MG tablet  Commonly known as:  MAXZIDE-25  TAKE 1 TABLET BY MOUTH DAILY AS NEEDED         Signed: Blaze Sandin D 11/27/2015, 9:29 AM

## 2015-11-27 NOTE — Progress Notes (Signed)
Patient alert and oriented, mae's well, voiding adequate amount of urine, swallowing without difficulty, no c/o pain. Patient discharged home with family. Script and discharged instructions given to patient. Patient and family stated understanding of d/c instructions given and has an appointment with MD. 

## 2015-11-27 NOTE — Progress Notes (Signed)
Physical Therapy Treatment Patient Details Name: Sydney Gillespie MRN: [id number] DOB: 04-24-1941 Today's Date: 11/27/2015    History of Present Illness Pt is a 75 y/o female who presents s/p L4-L5 PLIF on 11/25/15.     PT Comments    Pt progressing towards physical therapy goals. Pt reports feeling better today and denies dizziness, however continues to appear unsteady and requires UE support to maintain balance. Recommend use of cane at home for support as pt declines RW. Does have a rollator at home from husband if needed. Will continue to follow and progress as able per POC.   Follow Up Recommendations  Home health PT;Supervision for mobility/OOB     Equipment Recommendations  3in1 (PT)    Recommendations for Other Services       Precautions / Restrictions Precautions Precautions: Fall;Back Precaution Booklet Issued: Yes (comment) Precaution Comments: reviewed handout and pt was cued for precautions during functional mobility.  Required Braces or Orthoses: Spinal Brace Spinal Brace: Lumbar corset;Applied in sitting position Restrictions Weight Bearing Restrictions: No    Mobility  Bed Mobility Overal bed mobility: Needs Assistance Bed Mobility: Rolling;Sidelying to Sit Rolling: Supervision Sidelying to sit: Supervision       General bed mobility comments: Supervision for safety. Pt was able to transition to EOB without assistance however increased time was required.   Transfers Overall transfer level: Needs assistance Equipment used: 1 person hand held assist Transfers: Sit to/from Stand Sit to Stand: Min guard         General transfer comment: Hands-on guarding for balance support as pt powered-up to full standing position. VC's for maintenance of back precautions required.   Ambulation/Gait Ambulation/Gait assistance: Min assist Ambulation Distance (Feet): 200 Feet Assistive device: 1 person hand held assist Gait Pattern/deviations: Step-through  pattern;Decreased stride length Gait velocity: Decreased Gait velocity interpretation: Below normal speed for age/gender General Gait Details: Continues to appear unsteady and requires HHA for balance support. Does not endorse dizziness.   Stairs         General stair comments: Stair training deferred as pt in a 1 level home and has a ramp to enter.   Wheelchair Mobility    Modified Rankin (Stroke Patients Only)       Balance Overall balance assessment: Needs assistance Sitting-balance support: Feet supported;No upper extremity supported Sitting balance-Leahy Scale: Fair     Standing balance support: No upper extremity supported;During functional activity Standing balance-Leahy Scale: Poor Standing balance comment: Requires UE support to maintain standing balance especially for dynamic activity.                     Cognition Arousal/Alertness: Awake/alert Behavior During Therapy: WFL for tasks assessed/performed Overall Cognitive Status: Within Functional Limits for tasks assessed                      Exercises      General Comments        Pertinent Vitals/Pain Pain Assessment: Faces Faces Pain Scale: Hurts even more Pain Location: Incision site Pain Descriptors / Indicators: Operative site guarding;Sore Pain Intervention(s): Limited activity within patient's tolerance;Monitored during session;Repositioned    Home Living                      Prior Function            PT Goals (current goals can now be found in the care plan section) Acute Rehab PT Goals Patient Stated Goal: Home today  PT Goal Formulation: With patient Time For Goal Achievement: 12/03/15 Potential to Achieve Goals: Good Progress towards PT goals: Progressing toward goals    Frequency  Min 5X/week    PT Plan Current plan remains appropriate    Co-evaluation             End of Session Equipment Utilized During Treatment: Gait belt;Back brace Activity  Tolerance: Patient tolerated treatment well Patient left: in chair;with call bell/phone within reach     Time: 0759-0816 PT Time Calculation (min) (ACUTE ONLY): 17 min  Charges:  $Gait Training: 8-22 mins                    G Codes:      Rolinda Roan 12/16/15, 8:39 AM   Rolinda Roan, PT, DPT Acute Rehabilitation Services Pager: [phone]

## 2015-12-04 ENCOUNTER — Inpatient Hospital Stay: Payer: Medicare Other

## 2015-12-11 ENCOUNTER — Ambulatory Visit: Payer: Medicare Other | Admitting: Internal Medicine

## 2015-12-11 ENCOUNTER — Inpatient Hospital Stay: Payer: Medicare Other | Attending: Internal Medicine

## 2015-12-11 ENCOUNTER — Other Ambulatory Visit: Payer: Self-pay

## 2015-12-11 ENCOUNTER — Other Ambulatory Visit: Payer: Medicare Other

## 2015-12-11 DIAGNOSIS — N189 Chronic kidney disease, unspecified: Secondary | ICD-10-CM | POA: Insufficient documentation

## 2015-12-11 DIAGNOSIS — C9001 Multiple myeloma in remission: Secondary | ICD-10-CM

## 2015-12-11 DIAGNOSIS — D649 Anemia, unspecified: Secondary | ICD-10-CM | POA: Diagnosis not present

## 2015-12-11 DIAGNOSIS — Z9484 Stem cells transplant status: Secondary | ICD-10-CM | POA: Insufficient documentation

## 2015-12-11 LAB — CBC WITH DIFFERENTIAL/PLATELET
Basophils Absolute: 0.1 10*3/uL (ref 0–0.1)
Basophils Relative: 1 %
Eosinophils Absolute: 0.4 10*3/uL (ref 0–0.7)
Eosinophils Relative: 5 %
HCT: 30.3 % — ABNORMAL LOW (ref 35.0–47.0)
Hemoglobin: 10.2 g/dL — ABNORMAL LOW (ref 12.0–16.0)
Lymphocytes Relative: 15 %
Lymphs Abs: 1.2 10*3/uL (ref 1.0–3.6)
MCH: 31.2 pg (ref 26.0–34.0)
MCHC: 33.7 g/dL (ref 32.0–36.0)
MCV: 92.5 fL (ref 80.0–100.0)
Monocytes Absolute: 0.5 10*3/uL (ref 0.2–0.9)
Monocytes Relative: 7 %
Neutro Abs: 5.8 10*3/uL (ref 1.4–6.5)
Neutrophils Relative %: 72 %
Platelets: 325 10*3/uL (ref 150–440)
RBC: 3.28 MIL/uL — ABNORMAL LOW (ref 3.80–5.20)
RDW: 13.7 % (ref 11.5–14.5)
WBC: 7.9 10*3/uL (ref 3.6–11.0)

## 2015-12-11 LAB — COMPREHENSIVE METABOLIC PANEL
ALT: 8 U/L — ABNORMAL LOW (ref 14–54)
AST: 21 U/L (ref 15–41)
Albumin: 3.7 g/dL (ref 3.5–5.0)
Alkaline Phosphatase: 80 U/L (ref 38–126)
Anion gap: 8 (ref 5–15)
BUN: 35 mg/dL — ABNORMAL HIGH (ref 6–20)
CO2: 25 mmol/L (ref 22–32)
Calcium: 9.1 mg/dL (ref 8.9–10.3)
Chloride: 101 mmol/L (ref 101–111)
Creatinine, Ser: 1.61 mg/dL — ABNORMAL HIGH (ref 0.44–1.00)
GFR calc Af Amer: 35 mL/min — ABNORMAL LOW (ref >60)
GFR calc non Af Amer: 30 mL/min — ABNORMAL LOW (ref >60)
Glucose, Bld: 100 mg/dL — ABNORMAL HIGH (ref 65–99)
Potassium: 3.8 mmol/L (ref 3.5–5.1)
Sodium: 134 mmol/L — ABNORMAL LOW (ref 135–145)
Total Bilirubin: 0.3 mg/dL (ref 0.3–1.2)
Total Protein: 7.1 g/dL (ref 6.5–8.1)

## 2015-12-12 LAB — PROTEIN ELECTROPHORESIS, SERUM
A/G Ratio: 1.1 (ref 0.7–1.7)
Albumin ELP: 3.3 g/dL (ref 2.9–4.4)
Alpha-1-Globulin: 0.4 g/dL (ref 0.0–0.4)
Alpha-2-Globulin: 1.1 g/dL — ABNORMAL HIGH (ref 0.4–1.0)
Beta Globulin: 0.9 g/dL (ref 0.7–1.3)
Gamma Globulin: 0.6 g/dL (ref 0.4–1.8)
Globulin, Total: 3 g/dL (ref 2.2–3.9)
Total Protein ELP: 6.3 g/dL (ref 6.0–8.5)

## 2015-12-12 LAB — KAPPA/LAMBDA LIGHT CHAINS
Kappa free light chain: 33.4 mg/L — ABNORMAL HIGH (ref 3.3–19.4)
Kappa, lambda light chain ratio: 1.71 — ABNORMAL HIGH (ref 0.26–1.65)
Lambda free light chains: 19.5 mg/L (ref 5.7–26.3)

## 2015-12-18 ENCOUNTER — Inpatient Hospital Stay (HOSPITAL_BASED_OUTPATIENT_CLINIC_OR_DEPARTMENT_OTHER): Payer: Medicare Other | Admitting: Internal Medicine

## 2015-12-18 DIAGNOSIS — N189 Chronic kidney disease, unspecified: Secondary | ICD-10-CM

## 2015-12-18 DIAGNOSIS — D649 Anemia, unspecified: Secondary | ICD-10-CM | POA: Diagnosis not present

## 2015-12-18 DIAGNOSIS — Z9484 Stem cells transplant status: Secondary | ICD-10-CM | POA: Diagnosis not present

## 2015-12-18 DIAGNOSIS — C9001 Multiple myeloma in remission: Secondary | ICD-10-CM | POA: Diagnosis not present

## 2015-12-18 DIAGNOSIS — C9000 Multiple myeloma not having achieved remission: Secondary | ICD-10-CM | POA: Insufficient documentation

## 2015-12-18 NOTE — Assessment & Plan Note (Addendum)
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  # July 2017 M protein absent; kappa lambda light chain ratio slightly abnormal 1.7. Patient seems to be in remission at this time.  # Mild anemia hemoglobin around 10- question secondary CKD. Not symptomatic.  # Chronic kidney disease- creatinine around 1.6. Stable. Follows up with Dr. Candiss Norse. Un- likely from myeloma progression.  # Follow-up in approximately 3 months with labs/myeloma panel/iron studies. I will plan discussed with the patient and family.

## 2015-12-18 NOTE — Progress Notes (Signed)
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 benign     Multiple myeloma ([place])   11/01/2014 Initial Diagnosis    Multiple myeloma (HCC)      Multiple myeloma in remission ([place])   12/18/2015 Initial Diagnosis    Multiple myeloma in remission [facility])           INTERVAL HISTORY: This is a pleasant 75 year old female patient with above history of multiple  Myeloma currently on surveillance/no maintenance therapy is here for follow-up.  Patient denies any unusual swelling in the legs or tingling or numbness. Appetite is good. No weight loss.  Patient overall feels okay she is chronic back pain for which he gets injections.Patient has chronic arthritic pain in her small joints of the hand. She is wearing a back brace.  REVIEW OF SYSTEMS:   10 point review of system is done is negative for mentioned above and in history of present illness  I have reviewed the past medical history, past surgical history, social history and family history with the patient and they are unchanged from previous note unless stated above.  ALLERGIES:  is allergic to isoniazid.  MEDICATIONS:  Current Outpatient Prescriptions  Medication Sig Dispense Refill  . allopurinol (ZYLOPRIM) 300 MG tablet Take 300 mg by mouth daily.     Marland Kitchen ALPRAZolam (XANAX) 0.25 MG tablet Take 0.25 mg by mouth 2 (two) times daily as needed.  0  . atenolol (TENORMIN) 50 MG tablet Take 50 mg by mouth daily.     . cetirizine (ZYRTEC) 10 MG tablet Take 10 mg by mouth 2 (two) times daily.     . cyclobenzaprine  (FLEXERIL) 10 MG tablet Take 1 tablet (10 mg total) by mouth 3 (three) times daily as needed for muscle spasms. 50 tablet 1  . docusate sodium (COLACE) 100 MG capsule Take 1 capsule (100 mg total) by mouth 2 (two) times daily. 60 capsule 0  . escitalopram (LEXAPRO) 10 MG tablet Take 10 mg by mouth daily.  5  . estrogens, conjugated, (PREMARIN) 0.625 MG tablet Take 0.625 mg by mouth daily.     . fexofenadine (ALLEGRA) 180 MG tablet Take 180 mg by mouth 2 (two) times daily.    . fluticasone (FLONASE) 50 MCG/ACT nasal spray Place 2 sprays into the nose daily.     Marland Kitchen gabapentin (NEURONTIN) 300 MG capsule 1 po at AM, 1 at Lunch, and 2 tablets bedtime    . hydroxychloroquine (PLAQUENIL) 200 MG tablet TAKE 2 TABLETS BY MOUTH DAILY    . lansoprazole (PREVACID) 30 MG capsule Take 30 mg by mouth daily at 12 noon.     . leflunomide (ARAVA) 10 MG tablet Take 10 tablets by mouth daily.  1  . oxyCODONE-acetaminophen (PERCOCET/ROXICET) 5-325 MG tablet Take 1-2 tablets by mouth every 4 (four) hours as needed for moderate pain. 50 tablet 0  . traMADol (ULTRAM) 50 MG tablet TAKE 1 TABLET BY MOUTH FOUR TIMES DAILY AS NEEDED    . triamterene-hydrochlorothiazide (MAXZIDE-25) 37.5-25 MG per tablet TAKE 1 TABLET BY MOUTH DAILY AS  NEEDED     No current facility-administered medications for this visit.     PHYSICAL EXAMINATION: ECOG PERFORMANCE STATUS: 0 - Asymptomatic  BP 125/71 (BP Location: Left Arm, Patient Position: Sitting)   Pulse 75   Temp 97.9 F (36.6 C) (Tympanic)   Resp 18   Wt 171 lb 2 oz (77.6 kg)   BMI 26.80 kg/m   Filed Weights   12/18/15 0931  Weight: 171 lb 2 oz (77.6 kg)    GENERAL:alert, no distress and comfortable.Accompanied from family. EYES: normal, Conjunctiva are pink and non-injected, sclera clear OROPHARYNX:no exudate, no erythema and lips, buccal mucosa, and tongue normal  NECK: No thyromegaly LYMPH:  no palpable lymphadenopathy in the cervical, axillary or inguinal LUNGS:  clear to auscultation with normal breathing effort; No Wheeze or crackles  Cardio-vascular- Regular Rate & Rythm and no murmurs and no lower extremity edema ABDOMEN:abdomen soft, non-tender and normal bowel sounds; No hepato-splenomegaly.  Musculoskeletal:no cyanosis of digits and no clubbing  NEURO: alert & oriented x 3 with fluent speech, no focal motor/sensory deficits. SKIN: ecchymosis bilateral upper extremities [chronic]  LABORATORY DATA:  I have reviewed the data as listed    Component Value Date/Time   NA 134 (L) 12/11/2015 0939   NA 142 08/16/2013 1028   K 3.8 12/11/2015 0939   K 4.5 08/16/2013 1028   CL 101 12/11/2015 0939   CL 106 08/16/2013 1028   CO2 25 12/11/2015 0939   CO2 27 08/16/2013 1028   GLUCOSE 100 (H) 12/11/2015 0939   GLUCOSE 99 08/16/2013 1028   BUN 35 (H) 12/11/2015 0939   BUN 21 (H) 08/16/2013 1028   CREATININE 1.61 (H) 12/11/2015 0939   CREATININE 1.42 (H) 09/07/2014 0954   CALCIUM 9.1 12/11/2015 0939   CALCIUM 9.3 09/07/2014 0954   PROT 7.1 12/11/2015 0939   PROT 7.4 08/16/2013 1028   ALBUMIN 3.7 12/11/2015 0939   ALBUMIN 3.9 08/16/2013 1028   AST 21 12/11/2015 0939   AST 27 08/16/2013 1028   ALT 8 (L) 12/11/2015 0939   ALT 15 08/16/2013 1028   ALKPHOS 80 12/11/2015 0939   ALKPHOS 61 08/16/2013 1028   BILITOT 0.3 12/11/2015 0939   BILITOT 0.3 08/16/2013 1028   GFRNONAA 30 (L) 12/11/2015 0939   GFRNONAA 36 (L) 09/07/2014 0954   GFRAA 35 (L) 12/11/2015 0939   GFRAA 42 (L) 09/07/2014 0954    No results found for: SPEP, UPEP  Lab Results  Component Value Date   WBC 7.9 12/11/2015   NEUTROABS 5.8 12/11/2015   HGB 10.2 (L) 12/11/2015   HCT 30.3 (L) 12/11/2015   MCV 92.5 12/11/2015   PLT 325 12/11/2015      Chemistry      Component Value Date/Time   NA 134 (L) 12/11/2015 0939   NA 142 08/16/2013 1028   K 3.8 12/11/2015 0939   K 4.5 08/16/2013 1028   CL 101 12/11/2015 0939   CL 106 08/16/2013 1028   CO2 25 12/11/2015 0939   CO2  27 08/16/2013 1028   BUN 35 (H) 12/11/2015 0939   BUN 21 (H) 08/16/2013 1028   CREATININE 1.61 (H) 12/11/2015 0939   CREATININE 1.42 (H) 09/07/2014 0954      Component Value Date/Time   CALCIUM 9.1 12/11/2015 0939   CALCIUM 9.3 09/07/2014 0954   ALKPHOS 80 12/11/2015 0939   ALKPHOS 61 08/16/2013 1028   AST 21 12/11/2015 0939   AST 27 08/16/2013 1028   ALT 8 (L) 12/11/2015 0347  ALT 15 08/16/2013 1028   BILITOT 0.3 12/11/2015 0939   BILITOT 0.3 08/16/2013 1028       RADIOGRAPHIC STUDIES: I have personally reviewed the radiological images as listed and agreed with the findings in the report. No results found.   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  # July 2017 M protein absent; kappa lambda light chain ratio slightly abnormal 1.7. Patient seems to be in remission at this time.  # Mild anemia hemoglobin around 10- question secondary CKD. Not symptomatic.  # Chronic kidney disease- creatinine around 1.6. Stable. Follows up with Dr. Candiss Norse. Un- likely from myeloma progression.  # Follow-up in approximately 3 months with labs/myeloma panel/iron studies. I will plan discussed with the patient and family.      Cammie Sickle, MD 12/18/2015 3:38 PM

## 2015-12-23 ENCOUNTER — Other Ambulatory Visit: Payer: Self-pay | Admitting: Internal Medicine

## 2015-12-23 DIAGNOSIS — Z1231 Encounter for screening mammogram for malignant neoplasm of breast: Secondary | ICD-10-CM

## 2016-01-06 ENCOUNTER — Ambulatory Visit
Admission: RE | Admit: 2016-01-06 | Discharge: 2016-01-06 | Disposition: A | Discharge status: Home / Self Care | Payer: Medicare Other | Source: Ambulatory Visit | Attending: Internal Medicine | Admitting: Internal Medicine

## 2016-01-06 ENCOUNTER — Other Ambulatory Visit: Payer: Self-pay | Admitting: Internal Medicine

## 2016-01-06 DIAGNOSIS — Z1231 Encounter for screening mammogram for malignant neoplasm of breast: Secondary | ICD-10-CM | POA: Diagnosis present

## 2016-01-29 ENCOUNTER — Inpatient Hospital Stay: Payer: Medicare Other

## 2016-01-29 ENCOUNTER — Inpatient Hospital Stay: Payer: Medicare Other | Attending: Internal Medicine | Admitting: Internal Medicine

## 2016-01-29 ENCOUNTER — Encounter: Payer: Self-pay | Admitting: Internal Medicine

## 2016-01-29 VITALS — BP 105/65 | HR 76 | Temp 96.4°F | Resp 17 | Ht 67.0 in | Wt 169.6 lb

## 2016-01-29 DIAGNOSIS — D631 Anemia in chronic kidney disease: Secondary | ICD-10-CM | POA: Insufficient documentation

## 2016-01-29 DIAGNOSIS — I129 Hypertensive chronic kidney disease with stage 1 through stage 4 chronic kidney disease, or unspecified chronic kidney disease: Secondary | ICD-10-CM | POA: Insufficient documentation

## 2016-01-29 DIAGNOSIS — R7989 Other specified abnormal findings of blood chemistry: Secondary | ICD-10-CM

## 2016-01-29 DIAGNOSIS — C9001 Multiple myeloma in remission: Secondary | ICD-10-CM

## 2016-01-29 DIAGNOSIS — M549 Dorsalgia, unspecified: Secondary | ICD-10-CM | POA: Diagnosis not present

## 2016-01-29 DIAGNOSIS — E039 Hypothyroidism, unspecified: Secondary | ICD-10-CM | POA: Diagnosis not present

## 2016-01-29 DIAGNOSIS — R5383 Other fatigue: Secondary | ICD-10-CM

## 2016-01-29 DIAGNOSIS — N281 Cyst of kidney, acquired: Secondary | ICD-10-CM | POA: Insufficient documentation

## 2016-01-29 DIAGNOSIS — E559 Vitamin D deficiency, unspecified: Secondary | ICD-10-CM | POA: Insufficient documentation

## 2016-01-29 DIAGNOSIS — M069 Rheumatoid arthritis, unspecified: Secondary | ICD-10-CM | POA: Insufficient documentation

## 2016-01-29 DIAGNOSIS — Z79899 Other long term (current) drug therapy: Secondary | ICD-10-CM | POA: Insufficient documentation

## 2016-01-29 DIAGNOSIS — N189 Chronic kidney disease, unspecified: Secondary | ICD-10-CM | POA: Diagnosis not present

## 2016-01-29 DIAGNOSIS — R069 Unspecified abnormalities of breathing: Secondary | ICD-10-CM

## 2016-01-29 LAB — LACTATE DEHYDROGENASE: LDH: 179 U/L (ref 98–192)

## 2016-01-29 LAB — MAGNESIUM: Magnesium: 2.1 mg/dL (ref 1.7–2.4)

## 2016-01-29 LAB — CBC WITH DIFFERENTIAL/PLATELET
Basophils Absolute: 0.1 10*3/uL (ref 0–0.1)
Basophils Relative: 1 %
Eosinophils Absolute: 0.2 10*3/uL (ref 0–0.7)
Eosinophils Relative: 2 %
HCT: 31.8 % — ABNORMAL LOW (ref 35.0–47.0)
Hemoglobin: 10.8 g/dL — ABNORMAL LOW (ref 12.0–16.0)
Lymphocytes Relative: 12 %
Lymphs Abs: 1.1 10*3/uL (ref 1.0–3.6)
MCH: 30 pg (ref 26.0–34.0)
MCHC: 33.9 g/dL (ref 32.0–36.0)
MCV: 88.7 fL (ref 80.0–100.0)
Monocytes Absolute: 0.5 10*3/uL (ref 0.2–0.9)
Monocytes Relative: 5 %
Neutro Abs: 7.6 10*3/uL — ABNORMAL HIGH (ref 1.4–6.5)
Neutrophils Relative %: 80 %
Platelets: 297 10*3/uL (ref 150–440)
RBC: 3.58 MIL/uL — ABNORMAL LOW (ref 3.80–5.20)
RDW: 13.5 % (ref 11.5–14.5)
WBC: 9.4 10*3/uL (ref 3.6–11.0)

## 2016-01-29 LAB — COMPREHENSIVE METABOLIC PANEL
ALT: 15 U/L (ref 14–54)
AST: 29 U/L (ref 15–41)
Albumin: 3.8 g/dL (ref 3.5–5.0)
Alkaline Phosphatase: 67 U/L (ref 38–126)
Anion gap: 8 (ref 5–15)
BUN: 26 mg/dL — ABNORMAL HIGH (ref 6–20)
CO2: 23 mmol/L (ref 22–32)
Calcium: 8.3 mg/dL — ABNORMAL LOW (ref 8.9–10.3)
Chloride: 101 mmol/L (ref 101–111)
Creatinine, Ser: 1.55 mg/dL — ABNORMAL HIGH (ref 0.44–1.00)
GFR calc Af Amer: 37 mL/min — ABNORMAL LOW (ref >60)
GFR calc non Af Amer: 32 mL/min — ABNORMAL LOW (ref >60)
Glucose, Bld: 128 mg/dL — ABNORMAL HIGH (ref 65–99)
Potassium: 3 mmol/L — ABNORMAL LOW (ref 3.5–5.1)
Sodium: 132 mmol/L — ABNORMAL LOW (ref 135–145)
Total Bilirubin: 0.4 mg/dL (ref 0.3–1.2)
Total Protein: 7.7 g/dL (ref 6.5–8.1)

## 2016-01-29 LAB — IRON AND TIBC
Iron: 39 ug/dL (ref 28–170)
Saturation Ratios: 15 % (ref 10.4–31.8)
TIBC: 253 ug/dL (ref 250–450)
UIBC: 214 ug/dL

## 2016-01-29 LAB — C-REACTIVE PROTEIN: CRP: 6.5 mg/dL — ABNORMAL HIGH (ref <1.0)

## 2016-01-29 LAB — CORTISOL: Cortisol, Plasma: 12.8 ug/dL

## 2016-01-29 LAB — FERRITIN: Ferritin: 193 ng/mL (ref 11–307)

## 2016-01-29 NOTE — Progress Notes (Signed)
Pt reports since last visit she saw PCP and was started on Levothyroxine and is taking 2.  Per pt PCP suggested he would more than likely need her to take 50 mcg so she went ahead and increased it on her own.  Pt reports she is severe fatigue and weakness, with a decreased appetite and nausea.  She hasnt' followed up with PCP again because [place] is [place] of [place] a [place].

## 2016-01-29 NOTE — Progress Notes (Signed)
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 benign     Multiple myeloma ([address])   11/01/2014 Initial Diagnosis    Multiple myeloma (HCC)       Multiple myeloma in remission ([place])   12/18/2015 Initial Diagnosis    Multiple myeloma in remission [place])            INTERVAL HISTORY: This is a pleasant 75 year old female patient with above history of multiple  Myeloma currently on surveillance/no maintenance therapy is here For further evaluation of fatigue.  Patient states that he knows to have significant fatigue over the last month or so. Denies any fevers. Denies any chills. Denies any significant weight loss. Denies any new history of pain. She is chronic arthritis. Denies any depression.  She was noted to have hypothyroidism-with PCP approximately month ago TAC to around 10 and started on Synthroid. Did not notice significant improvement in her energy levels. No night sweats. "Feels cold all the time "  Patient denies any unusual swelling in the legs or tingling or numbness. Appetite is good. No weight loss.   REVIEW OF SYSTEMS:   10 point review of system is done is negative for mentioned above and in history of present illness  I have reviewed the past medical history, past surgical history, social history and family history with the patient and they are unchanged from previous note unless stated above.  ALLERGIES:  is allergic to isoniazid and keflex [cephalexin].  MEDICATIONS:  Current Outpatient Prescriptions  Medication Sig Dispense Refill  .  allopurinol (ZYLOPRIM) 300 MG tablet Take 300 mg by mouth daily.     Marland Kitchen ALPRAZolam (XANAX) 0.25 MG tablet Take 0.25 mg by mouth 2 (two) times daily as needed.  0  . atenolol (TENORMIN) 50 MG tablet Take 50 mg by mouth daily.     . cetirizine (ZYRTEC) 10 MG tablet Take 10 mg by mouth 2 (two) times daily.     . cyclobenzaprine (FLEXERIL) 10 MG tablet Take 1 tablet (10 mg total) by mouth 3 (three) times daily as needed for muscle spasms. 50 tablet 1  . docusate sodium (COLACE) 100 MG capsule Take 1 capsule (100 mg total) by mouth 2 (two) times daily. 60 capsule 0  . escitalopram (LEXAPRO) 10 MG tablet Take 10 mg by mouth daily.  5  . estrogens, conjugated, (PREMARIN) 0.625 MG tablet Take 0.625 mg by mouth daily.     . fexofenadine (ALLEGRA) 180 MG tablet Take 180 mg by mouth 2 (two) times daily.    . fluticasone (FLONASE) 50 MCG/ACT nasal spray Place 2 sprays into the nose daily.     . hydroxychloroquine (PLAQUENIL) 200 MG tablet TAKE 2 TABLETS BY MOUTH DAILY    . lansoprazole (PREVACID) 30 MG capsule Take 30 mg by mouth daily at 12 noon.     . leflunomide (ARAVA) 10 MG tablet Take 10 tablets by mouth daily.  1  . levothyroxine (SYNTHROID, LEVOTHROID) 25 MCG tablet Take by mouth.    . traMADol (ULTRAM) 50 MG tablet TAKE 1 TABLET BY  MOUTH FOUR TIMES DAILY AS NEEDED    . triamterene-hydrochlorothiazide (MAXZIDE-25) 37.5-25 MG per tablet TAKE 1 TABLET BY MOUTH DAILY AS NEEDED    . oxyCODONE-acetaminophen (PERCOCET/ROXICET) 5-325 MG tablet Take 1-2 tablets by mouth every 4 (four) hours as needed for moderate pain. (Patient not taking: Reported on 01/29/2016) 50 tablet 0   No current facility-administered medications for this visit.     PHYSICAL EXAMINATION: ECOG PERFORMANCE STATUS: 0 - Asymptomatic  BP 105/65 (BP Location: Right Arm, Patient Position: Sitting)   Pulse 76   Temp (!) 96.4 F (35.8 C) (Tympanic)   Resp 17   Ht _0  (1.702 m)   Wt 169 lb 9.6 oz (76.9 kg)   BMI 26.56 kg/m    Filed Weights   01/29/16 1039  Weight: 169 lb 9.6 oz (76.9 kg)    GENERAL:alert, no distress and comfortable.Accompanied from family. EYES: normal, Conjunctiva are pink and non-injected, sclera clear OROPHARYNX:no exudate, no erythema and lips, buccal mucosa, and tongue normal  NECK: No thyromegaly LYMPH:  no palpable lymphadenopathy in the cervical, axillary or inguinal LUNGS: clear to auscultation with normal breathing effort; No Wheeze or crackles  Cardio-vascular- Regular Rate & Rythm and no murmurs and no lower extremity edema ABDOMEN:abdomen soft, non-tender and normal bowel sounds; No hepato-splenomegaly.  Musculoskeletal:no cyanosis of digits and no clubbing  NEURO: alert & oriented x 3 with fluent speech, no focal motor/sensory deficits. SKIN: ecchymosis bilateral upper extremities [chronic]  LABORATORY DATA:  I have reviewed the data as listed    Component Value Date/Time   NA 132 (L) 01/29/2016 1139   NA 142 08/16/2013 1028   K 3.0 (L) 01/29/2016 1139   K 4.5 08/16/2013 1028   CL 101 01/29/2016 1139   CL 106 08/16/2013 1028   CO2 23 01/29/2016 1139   CO2 27 08/16/2013 1028   GLUCOSE 128 (H) 01/29/2016 1139   GLUCOSE 99 08/16/2013 1028   BUN 26 (H) 01/29/2016 1139   BUN 21 (H) 08/16/2013 1028   CREATININE 1.55 (H) 01/29/2016 1139   CREATININE 1.42 (H) 09/07/2014 0954   CALCIUM 8.3 (L) 01/29/2016 1139   CALCIUM 9.3 09/07/2014 0954   PROT 7.7 01/29/2016 1139   PROT 7.4 08/16/2013 1028   ALBUMIN 3.8 01/29/2016 1139   ALBUMIN 3.9 08/16/2013 1028   AST 29 01/29/2016 1139   AST 27 08/16/2013 1028   ALT 15 01/29/2016 1139   ALT 15 08/16/2013 1028   ALKPHOS 67 01/29/2016 1139   ALKPHOS 61 08/16/2013 1028   BILITOT 0.4 01/29/2016 1139   BILITOT 0.3 08/16/2013 1028   GFRNONAA 32 (L) 01/29/2016 1139   GFRNONAA 36 (L) 09/07/2014 0954   GFRAA 37 (L) 01/29/2016 1139   GFRAA 42 (L) 09/07/2014 0954    No results found for: SPEP, UPEP  Lab Results  Component  Value Date   WBC 9.4 01/29/2016   NEUTROABS 7.6 (H) 01/29/2016   HGB 10.8 (L) 01/29/2016   HCT 31.8 (L) 01/29/2016   MCV 88.7 01/29/2016   PLT 297 01/29/2016      Chemistry      Component Value Date/Time   NA 132 (L) 01/29/2016 1139   NA 142 08/16/2013 1028   K 3.0 (L) 01/29/2016 1139   K 4.5 08/16/2013 1028   CL 101 01/29/2016 1139   CL 106 08/16/2013 1028   CO2 23 01/29/2016 1139   CO2 27 08/16/2013 1028   BUN 26 (H) 01/29/2016 1139   BUN 21 (H) 08/16/2013 1028  CREATININE 1.55 (H) 01/29/2016 1139   CREATININE 1.42 (H) 09/07/2014 0954      Component Value Date/Time   CALCIUM 8.3 (L) 01/29/2016 1139   CALCIUM 9.3 09/07/2014 0954   ALKPHOS 67 01/29/2016 1139   ALKPHOS 61 08/16/2013 1028   AST 29 01/29/2016 1139   AST 27 08/16/2013 1028   ALT 15 01/29/2016 1139   ALT 15 08/16/2013 1028   BILITOT 0.4 01/29/2016 1139   BILITOT 0.3 08/16/2013 1028       RADIOGRAPHIC STUDIES: I have personally reviewed the radiological images as listed and agreed with the findings in the report. No results found.   ASSESSMENT & PLAN:    Multiple myeloma in remission [facility]) Multiple myeloma diagnosed in 2012 status post autologous stem cell transplant  2013 is currently on surveillance- not on maintenance therapy. AUG  2017 M protein absent; kappa lambda light chain ratio slightly abnormal 1.7. Patient seems to be in remission at this time. Given the new onset of fatigue we will repeat the myeloma numbers.  # Mild anemia/secondary to CKD recheck CBC today  # Hypothyroidism- TSH 11- on synthroid 25 mcg; went up to 50 mcg 2 days ago. We'll recheck thyroid profile;   # Fatigue- ? Etiology; also check cortisol CMP  # Chronic kidney disease- creatinine around 1.6. Stable. Follows up with Dr. Candiss Norse. Un- likely from myeloma progression.  # Ph: [phone]/[facility] pt.  Call pt with results.  # follow up in 1 month.      Cammie Sickle, MD 01/29/2016 12:57 PM

## 2016-01-29 NOTE — Assessment & Plan Note (Addendum)
Multiple myeloma diagnosed in 2012 status post autologous stem cell transplant  2013 is currently on surveillance- not on maintenance therapy. AUG  2017 M protein absent; kappa lambda light chain ratio slightly abnormal 1.7. Patient seems to be in remission at this time. Given the new onset of fatigue we will repeat the myeloma numbers.  # Mild anemia/secondary to CKD recheck CBC today  # Hypothyroidism- TSH 11- on synthroid 25 mcg; went up to 50 mcg 2 days ago. We'll recheck thyroid profile;   # Fatigue- ? Etiology; also check cortisol CMP  # Chronic kidney disease- creatinine around 1.6. Stable. Follows up with Dr. Candiss Norse. Un- likely from myeloma progression.  # Ph: [phone]/[facility] pt.  Call pt with results.  # follow up in 1 month.

## 2016-01-30 LAB — THYROID PANEL WITH TSH
Free Thyroxine Index: 2.2 (ref 1.2–4.9)
T3 Uptake Ratio: 28 % (ref 24–39)
T4, Total: 7.9 ug/dL (ref 4.5–12.0)
TSH: 2.19 u[IU]/mL (ref 0.450–4.500)

## 2016-01-30 LAB — VITAMIN D 25 HYDROXY (VIT D DEFICIENCY, FRACTURES): Vit D, 25-Hydroxy: 32.2 ng/mL (ref 30.0–100.0)

## 2016-01-30 LAB — KAPPA/LAMBDA LIGHT CHAINS
Kappa free light chain: 37.6 mg/L — ABNORMAL HIGH (ref 3.3–19.4)
Kappa, lambda light chain ratio: 1.57 (ref 0.26–1.65)
Lambda free light chains: 24 mg/L (ref 5.7–26.3)

## 2016-02-03 LAB — MULTIPLE MYELOMA PANEL, SERUM
Albumin SerPl Elph-Mcnc: 3.3 g/dL (ref 2.9–4.4)
Albumin/Glob SerPl: 1 (ref 0.7–1.7)
Alpha 1: 0.4 g/dL (ref 0.0–0.4)
Alpha2 Glob SerPl Elph-Mcnc: 1.4 g/dL — ABNORMAL HIGH (ref 0.4–1.0)
B-Globulin SerPl Elph-Mcnc: 1 g/dL (ref 0.7–1.3)
Gamma Glob SerPl Elph-Mcnc: 0.8 g/dL (ref 0.4–1.8)
Globulin, Total: 3.6 g/dL (ref 2.2–3.9)
IgA: 91 mg/dL (ref 64–422)
IgG (Immunoglobin G), Serum: 926 mg/dL (ref 700–1600)
IgM, Serum: 15 mg/dL — ABNORMAL LOW (ref 26–217)
Total Protein ELP: 6.9 g/dL (ref 6.0–8.5)

## 2016-03-12 ENCOUNTER — Other Ambulatory Visit: Payer: Medicare Other

## 2016-03-19 ENCOUNTER — Inpatient Hospital Stay: Payer: Medicare Other | Attending: Internal Medicine | Admitting: Internal Medicine

## 2016-03-19 VITALS — BP 128/77 | HR 83 | Temp 96.9°F | Resp 18 | Wt 173.4 lb

## 2016-03-19 DIAGNOSIS — R5383 Other fatigue: Secondary | ICD-10-CM | POA: Insufficient documentation

## 2016-03-19 DIAGNOSIS — G8929 Other chronic pain: Secondary | ICD-10-CM | POA: Diagnosis not present

## 2016-03-19 DIAGNOSIS — C9001 Multiple myeloma in remission: Secondary | ICD-10-CM | POA: Insufficient documentation

## 2016-03-19 DIAGNOSIS — Z79899 Other long term (current) drug therapy: Secondary | ICD-10-CM | POA: Insufficient documentation

## 2016-03-19 DIAGNOSIS — I129 Hypertensive chronic kidney disease with stage 1 through stage 4 chronic kidney disease, or unspecified chronic kidney disease: Secondary | ICD-10-CM | POA: Diagnosis not present

## 2016-03-19 DIAGNOSIS — N189 Chronic kidney disease, unspecified: Secondary | ICD-10-CM | POA: Diagnosis not present

## 2016-03-19 DIAGNOSIS — M549 Dorsalgia, unspecified: Secondary | ICD-10-CM | POA: Diagnosis not present

## 2016-03-19 DIAGNOSIS — E039 Hypothyroidism, unspecified: Secondary | ICD-10-CM | POA: Diagnosis not present

## 2016-03-19 DIAGNOSIS — M069 Rheumatoid arthritis, unspecified: Secondary | ICD-10-CM | POA: Diagnosis not present

## 2016-03-19 DIAGNOSIS — N281 Cyst of kidney, acquired: Secondary | ICD-10-CM | POA: Diagnosis not present

## 2016-03-19 NOTE — Progress Notes (Signed)
Patient is here for follow up  

## 2016-03-19 NOTE — Progress Notes (Signed)
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 benign     Multiple myeloma ([place])   11/01/2014 Initial Diagnosis    Multiple myeloma ([facility])       Multiple myeloma in remission ([place])   12/18/2015 Initial Diagnosis    Multiple myeloma in remission [facility])            INTERVAL HISTORY: This is a pleasant 75 year old female patient with above history of multiple  Myeloma currently on surveillance/no maintenance therapy- Is here for follow-up.  The interim patient was evaluated by her PCP started on iron pills potassium- and significant improvement noted in her fatigue. Overall she feels back to baseline.  Patient denies any unusual swelling in the legs or tingling or numbness. Appetite is good. No weight loss.   REVIEW OF SYSTEMS:   10 point review of system is done is negative for mentioned above and in history of present illness  I have reviewed the past medical history, past surgical history, social history and family history with the patient and they are unchanged from previous note unless stated above.  ALLERGIES:  is allergic to isoniazid and keflex [cephalexin].  MEDICATIONS:  Current Outpatient Prescriptions  Medication Sig Dispense Refill  . allopurinol (ZYLOPRIM) 300 MG tablet Take 300 mg by mouth daily.     Marland Kitchen ALPRAZolam (XANAX) 0.25 MG tablet Take 0.25 mg by mouth 2 (two) times daily as needed.  0  . atenolol (TENORMIN) 50 MG tablet Take 50 mg by mouth daily.     Marland Kitchen buPROPion (WELLBUTRIN XL) 150 MG 24 hr tablet Take 150 mg by mouth daily.     .  cetirizine (ZYRTEC) 10 MG tablet Take 10 mg by mouth 2 (two) times daily.     . cyclobenzaprine (FLEXERIL) 10 MG tablet Take 1 tablet (10 mg total) by mouth 3 (three) times daily as needed for muscle spasms. 50 tablet 1  . estrogens, conjugated, (PREMARIN) 0.625 MG tablet Take 0.625 mg by mouth daily.     . fexofenadine (ALLEGRA) 180 MG tablet Take 180 mg by mouth 2 (two) times daily.    . fluticasone (FLONASE) 50 MCG/ACT nasal spray Place 2 sprays into the nose daily.     Marland Kitchen HYDROcodone-acetaminophen (NORCO/VICODIN) 5-325 MG tablet 1 po tid prn    . hydroxychloroquine (PLAQUENIL) 200 MG tablet TAKE 2 TABLETS BY MOUTH DAILY    . lansoprazole (PREVACID) 30 MG capsule Take 30 mg by mouth daily at 12 noon.     . leflunomide (ARAVA) 10 MG tablet Take 10 tablets by mouth daily.  1  . levothyroxine (SYNTHROID, LEVOTHROID) 25 MCG tablet TAKE 1 TABLET(25 MCG) BY MOUTH EVERY DAY 30 TO 60 MINUTES BEFORE BREAKFAST ON AN EMPTY STOMACH AND WITH A GLASS OF WATER    . methocarbamol (ROBAXIN) 500 MG tablet Take by mouth.    . traMADol (ULTRAM) 50 MG tablet TAKE 1 TABLET BY MOUTH FOUR TIMES DAILY AS NEEDED    . triamterene-hydrochlorothiazide (MAXZIDE-25) 37.5-25 MG per tablet TAKE 1 TABLET BY MOUTH DAILY AS NEEDED    .  docusate sodium (COLACE) 100 MG capsule Take 1 capsule (100 mg total) by mouth 2 (two) times daily. (Patient not taking: Reported on 03/19/2016) 60 capsule 0  . oxyCODONE-acetaminophen (PERCOCET/ROXICET) 5-325 MG tablet Take 1-2 tablets by mouth every 4 (four) hours as needed for moderate pain. (Patient not taking: Reported on 03/19/2016) 50 tablet 0   No current facility-administered medications for this visit.     PHYSICAL EXAMINATION: ECOG PERFORMANCE STATUS: 0 - Asymptomatic  BP 128/77 (BP Location: Right Arm, Patient Position: Sitting)   Pulse 83   Temp (!) 96.9 F (36.1 C) (Tympanic)   Resp 18   Wt 173 lb 6.4 oz (78.7 kg)   BMI 27.16 kg/m   Filed Weights   03/19/16 1040  Weight:  173 lb 6.4 oz (78.7 kg)    GENERAL:alert, no distress and comfortable.Accompanied from family. EYES: normal, Conjunctiva are pink and non-injected, sclera clear OROPHARYNX:no exudate, no erythema and lips, buccal mucosa, and tongue normal  NECK: No thyromegaly LYMPH:  no palpable lymphadenopathy in the cervical, axillary or inguinal LUNGS: clear to auscultation with normal breathing effort; No Wheeze or crackles  Cardio-vascular- Regular Rate & Rythm and no murmurs and no lower extremity edema ABDOMEN:abdomen soft, non-tender and normal bowel sounds; No hepato-splenomegaly.  Musculoskeletal:no cyanosis of digits and no clubbing  NEURO: alert & oriented x 3 with fluent speech, no focal motor/sensory deficits. SKIN: ecchymosis bilateral upper extremities [chronic]  LABORATORY DATA:  I have reviewed the data as listed    Component Value Date/Time   NA 132 (L) 01/29/2016 1139   NA 142 08/16/2013 1028   K 3.0 (L) 01/29/2016 1139   K 4.5 08/16/2013 1028   CL 101 01/29/2016 1139   CL 106 08/16/2013 1028   CO2 23 01/29/2016 1139   CO2 27 08/16/2013 1028   GLUCOSE 128 (H) 01/29/2016 1139   GLUCOSE 99 08/16/2013 1028   BUN 26 (H) 01/29/2016 1139   BUN 21 (H) 08/16/2013 1028   CREATININE 1.55 (H) 01/29/2016 1139   CREATININE 1.42 (H) 09/07/2014 0954   CALCIUM 8.3 (L) 01/29/2016 1139   CALCIUM 9.3 09/07/2014 0954   PROT 7.7 01/29/2016 1139   PROT 7.4 08/16/2013 1028   ALBUMIN 3.8 01/29/2016 1139   ALBUMIN 3.9 08/16/2013 1028   AST 29 01/29/2016 1139   AST 27 08/16/2013 1028   ALT 15 01/29/2016 1139   ALT 15 08/16/2013 1028   ALKPHOS 67 01/29/2016 1139   ALKPHOS 61 08/16/2013 1028   BILITOT 0.4 01/29/2016 1139   BILITOT 0.3 08/16/2013 1028   GFRNONAA 32 (L) 01/29/2016 1139   GFRNONAA 36 (L) 09/07/2014 0954   GFRAA 37 (L) 01/29/2016 1139   GFRAA 42 (L) 09/07/2014 0954    No results found for: SPEP, UPEP  Lab Results  Component Value Date   WBC 9.4 01/29/2016   NEUTROABS  7.6 (H) 01/29/2016   HGB 10.8 (L) 01/29/2016   HCT 31.8 (L) 01/29/2016   MCV 88.7 01/29/2016   PLT 297 01/29/2016      Chemistry      Component Value Date/Time   NA 132 (L) 01/29/2016 1139   NA 142 08/16/2013 1028   K 3.0 (L) 01/29/2016 1139   K 4.5 08/16/2013 1028   CL 101 01/29/2016 1139   CL 106 08/16/2013 1028   CO2 23 01/29/2016 1139   CO2 27 08/16/2013 1028   BUN 26 (H) 01/29/2016 1139   BUN 21 (H) 08/16/2013 1028   CREATININE 1.55 (H) 01/29/2016 1139  CREATININE 1.42 (H) 09/07/2014 0954      Component Value Date/Time   CALCIUM 8.3 (L) 01/29/2016 1139   CALCIUM 9.3 09/07/2014 0954   ALKPHOS 67 01/29/2016 1139   ALKPHOS 61 08/16/2013 1028   AST 29 01/29/2016 1139   AST 27 08/16/2013 1028   ALT 15 01/29/2016 1139   ALT 15 08/16/2013 1028   BILITOT 0.4 01/29/2016 1139   BILITOT 0.3 08/16/2013 1028       RADIOGRAPHIC STUDIES: I have personally reviewed the radiological images as listed and agreed with the findings in the report. No results found.   ASSESSMENT & PLAN:    Multiple myeloma in remission [facility]) Multiple myeloma diagnosed in 2012 status post autologous stem cell transplant  2013 is currently on surveillance- not on maintenance therapy. OCT  2017 M protein absent; kappa lambda light chain ratio slightly abnormal 1.7. Patient seems to be in remission at this time.   # Hypothyroidism- as per PCP.   # Chronic kidney disease- creatinine around 1.6. Stable;   # Follow up in 3 months/myeloma labs/iron;ferritin.      Cammie Sickle, MD 03/19/2016 1:42 PM

## 2016-03-19 NOTE — Assessment & Plan Note (Signed)
Multiple myeloma diagnosed in 2012 status post autologous stem cell transplant  2013 is currently on surveillance- not on maintenance therapy. OCT  2017 M protein absent; kappa lambda light chain ratio slightly abnormal 1.7. Patient seems to be in remission at this time.   # Hypothyroidism- as per PCP.   # Chronic kidney disease- creatinine around 1.6. Stable;   # Follow up in 3 months/myeloma labs/iron;ferritin.

## 2016-04-30 ENCOUNTER — Other Ambulatory Visit: Payer: Self-pay | Admitting: Nephrology

## 2016-04-30 DIAGNOSIS — N183 Chronic kidney disease, stage 3 unspecified: Secondary | ICD-10-CM

## 2016-04-30 DIAGNOSIS — N281 Cyst of kidney, acquired: Secondary | ICD-10-CM

## 2016-05-07 ENCOUNTER — Ambulatory Visit
Admission: RE | Admit: 2016-05-07 | Discharge: 2016-05-07 | Disposition: A | Discharge status: Home / Self Care | Payer: Medicare Other | Source: Ambulatory Visit | Attending: Nephrology | Admitting: Nephrology

## 2016-05-07 DIAGNOSIS — N281 Cyst of kidney, acquired: Secondary | ICD-10-CM | POA: Insufficient documentation

## 2016-05-07 DIAGNOSIS — N183 Chronic kidney disease, stage 3 unspecified: Secondary | ICD-10-CM

## 2016-05-26 ENCOUNTER — Other Ambulatory Visit: Payer: Self-pay | Admitting: Internal Medicine

## 2016-05-26 DIAGNOSIS — M5416 Radiculopathy, lumbar region: Secondary | ICD-10-CM

## 2016-05-26 DIAGNOSIS — M25552 Pain in left hip: Secondary | ICD-10-CM

## 2016-05-26 DIAGNOSIS — M5442 Lumbago with sciatica, left side: Secondary | ICD-10-CM

## 2016-06-04 ENCOUNTER — Ambulatory Visit
Admission: RE | Admit: 2016-06-04 | Discharge: 2016-06-04 | Disposition: A | Discharge status: Home / Self Care | Payer: Medicare Other | Source: Ambulatory Visit | Attending: Internal Medicine | Admitting: Internal Medicine

## 2016-06-04 DIAGNOSIS — M5442 Lumbago with sciatica, left side: Secondary | ICD-10-CM | POA: Diagnosis present

## 2016-06-04 DIAGNOSIS — M25552 Pain in left hip: Secondary | ICD-10-CM

## 2016-06-04 DIAGNOSIS — M5416 Radiculopathy, lumbar region: Secondary | ICD-10-CM

## 2016-06-04 DIAGNOSIS — X58XXXA Exposure to other specified factors, initial encounter: Secondary | ICD-10-CM | POA: Diagnosis not present

## 2016-06-04 DIAGNOSIS — M48061 Spinal stenosis, lumbar region without neurogenic claudication: Secondary | ICD-10-CM | POA: Insufficient documentation

## 2016-06-04 DIAGNOSIS — M5136 Other intervertebral disc degeneration, lumbar region: Secondary | ICD-10-CM | POA: Diagnosis not present

## 2016-06-04 DIAGNOSIS — S22089A Unspecified fracture of T11-T12 vertebra, initial encounter for closed fracture: Secondary | ICD-10-CM | POA: Insufficient documentation

## 2016-06-04 DIAGNOSIS — M5126 Other intervertebral disc displacement, lumbar region: Secondary | ICD-10-CM | POA: Insufficient documentation

## 2016-06-12 ENCOUNTER — Inpatient Hospital Stay: Payer: Medicare Other | Attending: Internal Medicine

## 2016-06-12 ENCOUNTER — Other Ambulatory Visit: Payer: Self-pay | Admitting: Neurosurgery

## 2016-06-12 DIAGNOSIS — E039 Hypothyroidism, unspecified: Secondary | ICD-10-CM | POA: Diagnosis not present

## 2016-06-12 DIAGNOSIS — Z79899 Other long term (current) drug therapy: Secondary | ICD-10-CM | POA: Insufficient documentation

## 2016-06-12 DIAGNOSIS — D649 Anemia, unspecified: Secondary | ICD-10-CM | POA: Diagnosis not present

## 2016-06-12 DIAGNOSIS — R630 Anorexia: Secondary | ICD-10-CM | POA: Insufficient documentation

## 2016-06-12 DIAGNOSIS — R110 Nausea: Secondary | ICD-10-CM | POA: Insufficient documentation

## 2016-06-12 DIAGNOSIS — N281 Cyst of kidney, acquired: Secondary | ICD-10-CM | POA: Diagnosis not present

## 2016-06-12 DIAGNOSIS — N189 Chronic kidney disease, unspecified: Secondary | ICD-10-CM | POA: Diagnosis not present

## 2016-06-12 DIAGNOSIS — M069 Rheumatoid arthritis, unspecified: Secondary | ICD-10-CM | POA: Insufficient documentation

## 2016-06-12 DIAGNOSIS — C9001 Multiple myeloma in remission: Secondary | ICD-10-CM | POA: Insufficient documentation

## 2016-06-12 DIAGNOSIS — I129 Hypertensive chronic kidney disease with stage 1 through stage 4 chronic kidney disease, or unspecified chronic kidney disease: Secondary | ICD-10-CM | POA: Diagnosis not present

## 2016-06-12 DIAGNOSIS — G8929 Other chronic pain: Secondary | ICD-10-CM | POA: Diagnosis not present

## 2016-06-12 DIAGNOSIS — M549 Dorsalgia, unspecified: Secondary | ICD-10-CM | POA: Diagnosis not present

## 2016-06-12 LAB — CBC WITH DIFFERENTIAL/PLATELET
Basophils Absolute: 0.1 10*3/uL (ref 0–0.1)
Basophils Relative: 1 %
Eosinophils Absolute: 0.4 10*3/uL (ref 0–0.7)
Eosinophils Relative: 4 %
HCT: 32.8 % — ABNORMAL LOW (ref 35.0–47.0)
Hemoglobin: 10.9 g/dL — ABNORMAL LOW (ref 12.0–16.0)
Lymphocytes Relative: 12 %
Lymphs Abs: 1.1 10*3/uL (ref 1.0–3.6)
MCH: 30.3 pg (ref 26.0–34.0)
MCHC: 33.1 g/dL (ref 32.0–36.0)
MCV: 91.7 fL (ref 80.0–100.0)
Monocytes Absolute: 0.6 10*3/uL (ref 0.2–0.9)
Monocytes Relative: 6 %
Neutro Abs: 7.4 10*3/uL — ABNORMAL HIGH (ref 1.4–6.5)
Neutrophils Relative %: 77 %
Platelets: 234 10*3/uL (ref 150–440)
RBC: 3.58 MIL/uL — ABNORMAL LOW (ref 3.80–5.20)
RDW: 14.2 % (ref 11.5–14.5)
WBC: 9.6 10*3/uL (ref 3.6–11.0)

## 2016-06-12 LAB — BASIC METABOLIC PANEL
Anion gap: 9 (ref 5–15)
BUN: 25 mg/dL — ABNORMAL HIGH (ref 6–20)
CO2: 22 mmol/L (ref 22–32)
Calcium: 8.6 mg/dL — ABNORMAL LOW (ref 8.9–10.3)
Chloride: 104 mmol/L (ref 101–111)
Creatinine, Ser: 1.73 mg/dL — ABNORMAL HIGH (ref 0.44–1.00)
GFR calc Af Amer: 32 mL/min — ABNORMAL LOW (ref >60)
GFR calc non Af Amer: 27 mL/min — ABNORMAL LOW (ref >60)
Glucose, Bld: 105 mg/dL — ABNORMAL HIGH (ref 65–99)
Potassium: 3.1 mmol/L — ABNORMAL LOW (ref 3.5–5.1)
Sodium: 135 mmol/L (ref 135–145)

## 2016-06-12 LAB — IRON AND TIBC
Iron: 33 ug/dL (ref 28–170)
Saturation Ratios: 14 % (ref 10.4–31.8)
TIBC: 242 ug/dL — ABNORMAL LOW (ref 250–450)
UIBC: 209 ug/dL

## 2016-06-12 LAB — FERRITIN: Ferritin: 163 ng/mL (ref 11–307)

## 2016-06-15 LAB — KAPPA/LAMBDA LIGHT CHAINS
Kappa free light chain: 31.8 mg/L — ABNORMAL HIGH (ref 3.3–19.4)
Kappa, lambda light chain ratio: 1.69 — ABNORMAL HIGH (ref 0.26–1.65)
Lambda free light chains: 18.8 mg/L (ref 5.7–26.3)

## 2016-06-16 LAB — MULTIPLE MYELOMA PANEL, SERUM
Albumin SerPl Elph-Mcnc: 3.3 g/dL (ref 2.9–4.4)
Albumin/Glob SerPl: 1.1 (ref 0.7–1.7)
Alpha 1: 0.4 g/dL (ref 0.0–0.4)
Alpha2 Glob SerPl Elph-Mcnc: 1.2 g/dL — ABNORMAL HIGH (ref 0.4–1.0)
B-Globulin SerPl Elph-Mcnc: 0.9 g/dL (ref 0.7–1.3)
Gamma Glob SerPl Elph-Mcnc: 0.7 g/dL (ref 0.4–1.8)
Globulin, Total: 3.2 g/dL (ref 2.2–3.9)
IgA: 73 mg/dL (ref 64–422)
IgG (Immunoglobin G), Serum: 810 mg/dL (ref 700–1600)
IgM, Serum: 19 mg/dL — ABNORMAL LOW (ref 26–217)
Total Protein ELP: 6.5 g/dL (ref 6.0–8.5)

## 2016-06-17 ENCOUNTER — Encounter (HOSPITAL_COMMUNITY)
Admission: RE | Admit: 2016-06-17 | Discharge: 2016-06-17 | Disposition: A | Discharge status: Home / Self Care | Payer: Medicare Other | Source: Ambulatory Visit | Attending: Neurosurgery | Admitting: Neurosurgery

## 2016-06-17 ENCOUNTER — Other Ambulatory Visit (HOSPITAL_COMMUNITY): Payer: Self-pay | Admitting: *Deleted

## 2016-06-17 ENCOUNTER — Encounter (HOSPITAL_COMMUNITY): Payer: Self-pay

## 2016-06-17 HISTORY — DX: Hypothyroidism, unspecified: E03.9

## 2016-06-17 HISTORY — DX: Gastro-esophageal reflux disease without esophagitis: K21.9

## 2016-06-17 HISTORY — DX: Anemia, unspecified: D64.9

## 2016-06-17 HISTORY — DX: Family history of other specified conditions: Z84.89

## 2016-06-17 HISTORY — DX: Disease of blood and blood-forming organs, unspecified: D75.9

## 2016-06-17 HISTORY — DX: Cardiac arrhythmia, unspecified: I49.9

## 2016-06-17 LAB — BASIC METABOLIC PANEL
Anion gap: 11 (ref 5–15)
BUN: 18 mg/dL (ref 6–20)
CO2: 23 mmol/L (ref 22–32)
Calcium: 8.3 mg/dL — ABNORMAL LOW (ref 8.9–10.3)
Chloride: 106 mmol/L (ref 101–111)
Creatinine, Ser: 1.69 mg/dL — ABNORMAL HIGH (ref 0.44–1.00)
GFR calc Af Amer: 33 mL/min — ABNORMAL LOW (ref >60)
GFR calc non Af Amer: 28 mL/min — ABNORMAL LOW (ref >60)
Glucose, Bld: 105 mg/dL — ABNORMAL HIGH (ref 65–99)
Potassium: 3.6 mmol/L (ref 3.5–5.1)
Sodium: 140 mmol/L (ref 135–145)

## 2016-06-17 LAB — CBC
HCT: 32.7 % — ABNORMAL LOW (ref 36.0–46.0)
Hemoglobin: 10.2 g/dL — ABNORMAL LOW (ref 12.0–15.0)
MCH: 29.1 pg (ref 26.0–34.0)
MCHC: 31.2 g/dL (ref 30.0–36.0)
MCV: 93.4 fL (ref 78.0–100.0)
Platelets: 224 10*3/uL (ref 150–400)
RBC: 3.5 MIL/uL — ABNORMAL LOW (ref 3.87–5.11)
RDW: 14.1 % (ref 11.5–15.5)
WBC: 8.1 10*3/uL (ref 4.0–10.5)

## 2016-06-17 LAB — TYPE AND SCREEN
ABO/RH(D): O POS
Antibody Screen: NEGATIVE

## 2016-06-17 LAB — SURGICAL PCR SCREEN
MRSA, PCR: POSITIVE — AB
Staphylococcus aureus: POSITIVE — AB

## 2016-06-17 NOTE — Pre-Procedure Instructions (Addendum)
Sydney Gillespie  06/17/2016      [facility] [place], [place] [facility] [address] AT [place] [place] [facility] [place] [address] Phone: [phone] Fax: [phone]    Your procedure is scheduled on 06/18/16  Report to [facility] Admitting at 845 A.M.  Call this number if you have problems the morning of surgery:  [phone]   Remember:  Do not eat food or drink liquids after midnight.  Take these medicines the morning of surgery with A SIP OF WATER      Allopurinol,atenolol(tenormin),buproion (wellbutrin)plaquenil,prevacid if needed,levothyroxine  STOP all herbel meds, nsaids (aleve,naproxen,advil,ibuprofen)  Starting NOW including all vitamins/supplements,aspirin   Do not wear jewelry, make-up or nail polish.  Do not wear lotions, powders, or perfumes, or deoderant.  Do not shave 48 hours prior to surgery.  Men may shave face and neck.  Do not bring valuables to the hospital.  [facility] is not responsible for any belongings or valuables.  Contacts, dentures or bridgework may not be worn into surgery.  Leave your suitcase in the car.  After surgery it may be brought to your room.  For patients admitted to the hospital, discharge time will be determined by your treatment team.  Patients discharged the day of surgery will not be allowed to drive home.   Special instructions:   Special Instructions: [place] - Preparing for Surgery  Before surgery, you can play an important role.  Because skin is not sterile, your skin needs to be as free of germs as possible.  You can reduce the number of germs on you skin by washing with CHG (chlorahexidine gluconate) soap before surgery.  CHG is an antiseptic cleaner which kills germs and bonds with the skin to continue killing germs even after washing.  Please DO NOT use if you have an allergy to CHG or antibacterial soaps.  If your skin becomes reddened/irritated stop using the CHG  and inform your nurse when you arrive at Short Stay.  Do not shave (including legs and underarms) for at least 48 hours prior to the first CHG shower.  You may shave your face.  Please follow these instructions carefully:   1.  Shower with CHG Soap the night before surgery and the morning of Surgery.  2.  If you choose to wash your hair, wash your hair first as usual with your normal shampoo.  3.  After you shampoo, rinse your hair and body thoroughly to remove the Shampoo.  4.  Use CHG as you would any other liquid soap.  You can apply chg directly  to the skin and wash gently with scrungie or a clean washcloth.  5.  Apply the CHG Soap to your body ONLY FROM THE NECK DOWN.  Do not use on open wounds or open sores.  Avoid contact with your eyes ears, mouth and genitals (private parts).  Wash genitals (private parts)       with your normal soap.  6.  Wash thoroughly, paying special attention to the area where your surgery will be performed.  7.  Thoroughly rinse your body with warm water from the neck down.  8.  DO NOT shower/wash with your normal soap after using and rinsing off the CHG Soap.  9.  Pat yourself dry with a clean towel.            10.  Wear clean pajamas.  11.  Place clean sheets on your bed the night of your first shower and do not sleep with pets.  Day of Surgery  Do not apply any lotions/deodorants the morning of surgery.  Please wear clean clothes to the hospital/surgery center.  Please read over the fact sheets that you were given.

## 2016-06-17 NOTE — Progress Notes (Signed)
Anesthesia Chart Review: Patient is a 76 year old female scheduled for L3-4 laminectomy for facet/synovial cyst on 06/18/16 by Dr. Arnoldo Morale.   History includes non-smoker, HLD, palpitations, CKD stage III, gout, Barrett's esophagus, RA, multiple myeloma '12 s/p autologous stem cell transplant '13, HTN, hypothyroidism, GERD, anemia, appendectomy, cholecystectomy, hysterectomy, L4-5 PLIF on 11/25/15.  PCP is Dr. [name] [facility]; Care Everywhere). Nephrologist is Dr. Murlean Iba, records requested. Oncologist is Dr. Charlaine Dalton. Multiple myeloma was felt to be in remission as of 03/19/2016.  Meds include allopurinol, Xanax, atenolol, Wellbutrin XL, Zyrtec, Flexeril, Allegra, Flonase, Plaquenil, iron, Prevacid, Arava, levothyroxine, Percocet, potassium, Maxzide-25.   BP 99/66   Pulse 86   Temp 36.8 C   Resp 20   Ht '5\' 7"'  (1.702 m)   Wt 181 lb (82.1 kg)   SpO2 95%   BMI 28.35 kg/m   EKG 11/25/15: NSR, LAD, moderate voltage criteria for LVH, may be normal variant.   Renal U/S 05/07/16: IMPRESSION: Bilateral renal atrophy with measurements suggesting progression September 2016. Simple 3 cm left renal cyst.  Preoperative labs noted. Cr 1.69 which appears stable when compared to labs in Epic. She has known CKD and is followed by nephrology. H/H 10.2/32.7 (previous HGB in the 10-11 range). Glucose 105.  If no acute changes then I anticipate that she can proceed as planned.  George Hugh [facility] Short Stay Center/Anesthesiology Phone [phone] 06/17/2016 1:24 PM

## 2016-06-18 ENCOUNTER — Inpatient Hospital Stay (HOSPITAL_COMMUNITY)
Admission: RE | Admit: 2016-06-18 | Discharge: 2016-06-19 | DRG: 465 | Disposition: A | Discharge status: Home / Self Care | Payer: Medicare Other | Source: Ambulatory Visit | Attending: Neurosurgery | Admitting: Neurosurgery

## 2016-06-18 ENCOUNTER — Ambulatory Visit (HOSPITAL_COMMUNITY): Payer: Medicare Other | Admitting: Anesthesiology

## 2016-06-18 ENCOUNTER — Encounter (HOSPITAL_COMMUNITY): Payer: Self-pay | Admitting: Anesthesiology

## 2016-06-18 ENCOUNTER — Ambulatory Visit (HOSPITAL_COMMUNITY): Payer: Medicare Other | Admitting: Vascular Surgery

## 2016-06-18 ENCOUNTER — Encounter (HOSPITAL_COMMUNITY)
Admission: RE | Disposition: A | Discharge status: Home / Self Care | Payer: Self-pay | Source: Ambulatory Visit | Attending: Neurosurgery

## 2016-06-18 ENCOUNTER — Ambulatory Visit (HOSPITAL_COMMUNITY): Payer: Medicare Other

## 2016-06-18 DIAGNOSIS — E039 Hypothyroidism, unspecified: Secondary | ICD-10-CM | POA: Diagnosis present

## 2016-06-18 DIAGNOSIS — D649 Anemia, unspecified: Secondary | ICD-10-CM | POA: Diagnosis present

## 2016-06-18 DIAGNOSIS — I10 Essential (primary) hypertension: Secondary | ICD-10-CM | POA: Diagnosis present

## 2016-06-18 DIAGNOSIS — M069 Rheumatoid arthritis, unspecified: Secondary | ICD-10-CM | POA: Diagnosis present

## 2016-06-18 DIAGNOSIS — M7138 Other bursal cyst, other site: Secondary | ICD-10-CM | POA: Diagnosis present

## 2016-06-18 DIAGNOSIS — M48061 Spinal stenosis, lumbar region without neurogenic claudication: Secondary | ICD-10-CM | POA: Diagnosis present

## 2016-06-18 DIAGNOSIS — M5416 Radiculopathy, lumbar region: Principal | ICD-10-CM | POA: Diagnosis present

## 2016-06-18 DIAGNOSIS — Z888 Allergy status to other drugs, medicaments and biological substances status: Secondary | ICD-10-CM

## 2016-06-18 DIAGNOSIS — Z79899 Other long term (current) drug therapy: Secondary | ICD-10-CM

## 2016-06-18 HISTORY — PX: LUMBAR LAMINECTOMY/DECOMPRESSION MICRODISCECTOMY: SHX5026

## 2016-06-18 SURGERY — LUMBAR LAMINECTOMY/DECOMPRESSION MICRODISCECTOMY 1 LEVEL
Anesthesia: General | Site: Spine Lumbar | Laterality: Left

## 2016-06-18 MED ORDER — EPHEDRINE 5 MG/ML INJ
INTRAVENOUS | Status: AC
  Filled 2016-06-18: qty 10

## 2016-06-18 MED ORDER — ONDANSETRON HCL 4 MG/2ML IJ SOLN
INTRAMUSCULAR | Status: AC
  Filled 2016-06-18: qty 2

## 2016-06-18 MED ORDER — LEFLUNOMIDE 20 MG PO TABS
100.0000 mg | ORAL_TABLET | Freq: Every day | ORAL | Status: DC
  Filled 2016-06-18: qty 10

## 2016-06-18 MED ORDER — MUPIROCIN 2 % EX OINT
TOPICAL_OINTMENT | CUTANEOUS | Status: AC
  Filled 2016-06-18: qty 22

## 2016-06-18 MED ORDER — CHLORHEXIDINE GLUCONATE CLOTH 2 % EX PADS
6.0000 | MEDICATED_PAD | Freq: Every day | CUTANEOUS | Status: DC
  Administered 2016-06-19: 6 via TOPICAL

## 2016-06-18 MED ORDER — PHENYLEPHRINE 40 MCG/ML (10ML) SYRINGE FOR IV PUSH (FOR BLOOD PRESSURE SUPPORT)
PREFILLED_SYRINGE | INTRAVENOUS | Status: AC
  Filled 2016-06-18: qty 10

## 2016-06-18 MED ORDER — DEXAMETHASONE SODIUM PHOSPHATE 10 MG/ML IJ SOLN
INTRAMUSCULAR | Status: DC | PRN
  Administered 2016-06-18: 10 mg via INTRAVENOUS

## 2016-06-18 MED ORDER — SCOPOLAMINE 1 MG/3DAYS TD PT72
MEDICATED_PATCH | TRANSDERMAL | Status: AC
  Filled 2016-06-18: qty 1

## 2016-06-18 MED ORDER — THROMBIN 5000 UNITS EX SOLR
CUTANEOUS | Status: AC
  Filled 2016-06-18: qty 5000

## 2016-06-18 MED ORDER — THROMBIN 5000 UNITS EX SOLR
CUTANEOUS | Status: DC | PRN
  Administered 2016-06-18: 10000 [IU] via TOPICAL

## 2016-06-18 MED ORDER — LEVOTHYROXINE SODIUM 100 MCG PO TABS
50.0000 ug | ORAL_TABLET | Freq: Every day | ORAL | Status: DC
  Administered 2016-06-19: 50 ug via ORAL
  Filled 2016-06-18: qty 1

## 2016-06-18 MED ORDER — VANCOMYCIN HCL IN DEXTROSE 1-5 GM/200ML-% IV SOLN
1000.0000 mg | INTRAVENOUS | Status: AC
  Administered 2016-06-18: 1000 mg via INTRAVENOUS
  Filled 2016-06-18: qty 200

## 2016-06-18 MED ORDER — VANCOMYCIN HCL IN DEXTROSE 1-5 GM/200ML-% IV SOLN: 1000.0000 mg | Freq: Once | INTRAVENOUS | Status: DC

## 2016-06-18 MED ORDER — LORATADINE 10 MG PO TABS: 10.0000 mg | ORAL_TABLET | Freq: Every day | ORAL | Status: DC

## 2016-06-18 MED ORDER — DOCUSATE SODIUM 100 MG PO CAPS: 100.0000 mg | ORAL_CAPSULE | Freq: Two times a day (BID) | ORAL | Status: DC

## 2016-06-18 MED ORDER — BUPIVACAINE-EPINEPHRINE (PF) 0.5% -1:200000 IJ SOLN
INTRAMUSCULAR | Status: AC
  Filled 2016-06-18: qty 30

## 2016-06-18 MED ORDER — POTASSIUM 99 MG PO TABS: 99.0000 mg | ORAL_TABLET | Freq: Every day | ORAL | Status: DC

## 2016-06-18 MED ORDER — ARTIFICIAL TEARS OP OINT
TOPICAL_OINTMENT | OPHTHALMIC | Status: DC | PRN
  Administered 2016-06-18: 1 via OPHTHALMIC

## 2016-06-18 MED ORDER — DEXTROSE 5 % IV SOLN
INTRAVENOUS | Status: DC | PRN
  Administered 2016-06-18: 11:00:00 via INTRAVENOUS

## 2016-06-18 MED ORDER — VECURONIUM BROMIDE 10 MG IV SOLR
INTRAVENOUS | Status: AC
  Filled 2016-06-18: qty 10

## 2016-06-18 MED ORDER — OXYCODONE-ACETAMINOPHEN 10-325 MG PO TABS: 1.0000 | ORAL_TABLET | ORAL | Status: DC | PRN

## 2016-06-18 MED ORDER — MORPHINE SULFATE (PF) 4 MG/ML IV SOLN: 1.0000 mg | INTRAVENOUS | Status: DC | PRN

## 2016-06-18 MED ORDER — BUPIVACAINE-EPINEPHRINE (PF) 0.5% -1:200000 IJ SOLN
INTRAMUSCULAR | Status: DC | PRN
  Administered 2016-06-18: 10 mL

## 2016-06-18 MED ORDER — DEXAMETHASONE SODIUM PHOSPHATE 10 MG/ML IJ SOLN
INTRAMUSCULAR | Status: AC
  Filled 2016-06-18: qty 1

## 2016-06-18 MED ORDER — BACITRACIN ZINC 500 UNIT/GM EX OINT
TOPICAL_OINTMENT | CUTANEOUS | Status: DC | PRN
  Administered 2016-06-18: 1 via TOPICAL

## 2016-06-18 MED ORDER — CHLORHEXIDINE GLUCONATE CLOTH 2 % EX PADS: 6.0000 | MEDICATED_PAD | Freq: Once | CUTANEOUS | Status: DC

## 2016-06-18 MED ORDER — SODIUM CHLORIDE 0.9 % IV SOLN: 250.0000 mL | INTRAVENOUS | Status: DC

## 2016-06-18 MED ORDER — PHENOL 1.4 % MT LIQD: 1.0000 | OROMUCOSAL | Status: DC | PRN

## 2016-06-18 MED ORDER — SODIUM CHLORIDE 0.9% FLUSH
3.0000 mL | Freq: Two times a day (BID) | INTRAVENOUS | Status: DC
  Administered 2016-06-18: 3 mL via INTRAVENOUS

## 2016-06-18 MED ORDER — THROMBIN 5000 UNITS EX SOLR
OROMUCOSAL | Status: DC | PRN
  Administered 2016-06-18: 13:00:00 via TOPICAL

## 2016-06-18 MED ORDER — SUGAMMADEX SODIUM 200 MG/2ML IV SOLN
INTRAVENOUS | Status: AC
  Filled 2016-06-18: qty 2

## 2016-06-18 MED ORDER — ONDANSETRON HCL 4 MG/2ML IJ SOLN
INTRAMUSCULAR | Status: DC | PRN
  Administered 2016-06-18: 4 mg via INTRAVENOUS

## 2016-06-18 MED ORDER — ALLOPURINOL 300 MG PO TABS
300.0000 mg | ORAL_TABLET | Freq: Every day | ORAL | Status: DC
  Filled 2016-06-18: qty 1

## 2016-06-18 MED ORDER — 0.9 % SODIUM CHLORIDE (POUR BTL) OPTIME
TOPICAL | Status: DC | PRN
  Administered 2016-06-18: 1000 mL

## 2016-06-18 MED ORDER — MIDAZOLAM HCL 5 MG/5ML IJ SOLN
INTRAMUSCULAR | Status: DC | PRN
  Administered 2016-06-18: 2 mg via INTRAVENOUS

## 2016-06-18 MED ORDER — HEMOSTATIC AGENTS (NO CHARGE) OPTIME
TOPICAL | Status: DC | PRN
  Administered 2016-06-18: 1 via TOPICAL

## 2016-06-18 MED ORDER — ZOLPIDEM TARTRATE 5 MG PO TABS: 5.0000 mg | ORAL_TABLET | Freq: Every evening | ORAL | Status: DC | PRN

## 2016-06-18 MED ORDER — HYDROCODONE-ACETAMINOPHEN 5-325 MG PO TABS: 1.0000 | ORAL_TABLET | ORAL | Status: DC | PRN

## 2016-06-18 MED ORDER — PROPOFOL 10 MG/ML IV BOLUS
INTRAVENOUS | Status: DC | PRN
  Administered 2016-06-18: 80 mg via INTRAVENOUS

## 2016-06-18 MED ORDER — MENTHOL 3 MG MT LOZG: 1.0000 | LOZENGE | OROMUCOSAL | Status: DC | PRN

## 2016-06-18 MED ORDER — FENTANYL CITRATE (PF) 100 MCG/2ML IJ SOLN
INTRAMUSCULAR | Status: DC | PRN
  Administered 2016-06-18 (×2): 50 ug via INTRAVENOUS
  Administered 2016-06-18: 100 ug via INTRAVENOUS

## 2016-06-18 MED ORDER — ACETAMINOPHEN 325 MG PO TABS: 650.0000 mg | ORAL_TABLET | ORAL | Status: DC | PRN

## 2016-06-18 MED ORDER — ROCURONIUM BROMIDE 100 MG/10ML IV SOLN
INTRAVENOUS | Status: DC | PRN
  Administered 2016-06-18: 50 mg via INTRAVENOUS

## 2016-06-18 MED ORDER — VANCOMYCIN HCL IN DEXTROSE 1-5 GM/200ML-% IV SOLN
1000.0000 mg | Freq: Once | INTRAVENOUS | Status: AC
  Administered 2016-06-19: 1000 mg via INTRAVENOUS
  Filled 2016-06-18: qty 200

## 2016-06-18 MED ORDER — CEFAZOLIN SODIUM-DEXTROSE 2-4 GM/100ML-% IV SOLN: 2.0000 g | Freq: Three times a day (TID) | INTRAVENOUS | Status: DC

## 2016-06-18 MED ORDER — MUPIROCIN 2 % EX OINT
1.0000 | TOPICAL_OINTMENT | Freq: Two times a day (BID) | CUTANEOUS | Status: DC
Start: 2016-06-18 — End: 2016-06-19
  Administered 2016-06-18: 1 via NASAL
  Filled 2016-06-18: qty 22

## 2016-06-18 MED ORDER — LACTATED RINGERS IV SOLN
INTRAVENOUS | Status: DC
  Administered 2016-06-18 (×2): via INTRAVENOUS

## 2016-06-18 MED ORDER — LACTATED RINGERS IV SOLN
INTRAVENOUS | Status: DC | PRN
  Administered 2016-06-18 (×2): via INTRAVENOUS

## 2016-06-18 MED ORDER — SUGAMMADEX SODIUM 200 MG/2ML IV SOLN
INTRAVENOUS | Status: DC | PRN
  Administered 2016-06-18: 200 mg via INTRAVENOUS

## 2016-06-18 MED ORDER — THROMBIN 5000 UNITS EX SOLR
CUTANEOUS | Status: AC
  Filled 2016-06-18: qty 10000

## 2016-06-18 MED ORDER — BISACODYL 10 MG RE SUPP: 10.0000 mg | Freq: Every day | RECTAL | Status: DC | PRN

## 2016-06-18 MED ORDER — FENTANYL CITRATE (PF) 100 MCG/2ML IJ SOLN
INTRAMUSCULAR | Status: AC
  Filled 2016-06-18: qty 4

## 2016-06-18 MED ORDER — HYDROMORPHONE HCL 1 MG/ML IJ SOLN
INTRAMUSCULAR | Status: AC
  Filled 2016-06-18: qty 1

## 2016-06-18 MED ORDER — DOCUSATE SODIUM 100 MG PO CAPS
100.0000 mg | ORAL_CAPSULE | Freq: Two times a day (BID) | ORAL | Status: DC
  Administered 2016-06-18: 100 mg via ORAL
  Filled 2016-06-18 (×2): qty 1

## 2016-06-18 MED ORDER — CYCLOBENZAPRINE HCL 10 MG PO TABS
10.0000 mg | ORAL_TABLET | Freq: Three times a day (TID) | ORAL | Status: DC | PRN
  Filled 2016-06-18: qty 1

## 2016-06-18 MED ORDER — ATENOLOL 50 MG PO TABS
50.0000 mg | ORAL_TABLET | Freq: Every day | ORAL | Status: DC
  Filled 2016-06-18: qty 1

## 2016-06-18 MED ORDER — ALBUTEROL SULFATE HFA 108 (90 BASE) MCG/ACT IN AERS
INHALATION_SPRAY | RESPIRATORY_TRACT | Status: AC
  Filled 2016-06-18: qty 6.7

## 2016-06-18 MED ORDER — ONDANSETRON HCL 4 MG/2ML IJ SOLN: 4.0000 mg | INTRAMUSCULAR | Status: DC | PRN

## 2016-06-18 MED ORDER — MIDAZOLAM HCL 2 MG/2ML IJ SOLN
INTRAMUSCULAR | Status: AC
  Filled 2016-06-18: qty 2

## 2016-06-18 MED ORDER — CYCLOBENZAPRINE HCL 10 MG PO TABS: 10.0000 mg | ORAL_TABLET | Freq: Three times a day (TID) | ORAL | Status: DC | PRN

## 2016-06-18 MED ORDER — VECURONIUM BROMIDE 10 MG IV SOLR
INTRAVENOUS | Status: DC | PRN
  Administered 2016-06-18: 2 mg via INTRAVENOUS

## 2016-06-18 MED ORDER — LIDOCAINE HCL (CARDIAC) 20 MG/ML IV SOLN
INTRAVENOUS | Status: DC | PRN
  Administered 2016-06-18: 80 mg via INTRAVENOUS
  Administered 2016-06-18: 20 mg via INTRAVENOUS

## 2016-06-18 MED ORDER — LIDOCAINE 2% (20 MG/ML) 5 ML SYRINGE
INTRAMUSCULAR | Status: AC
  Filled 2016-06-18: qty 5

## 2016-06-18 MED ORDER — FLUTICASONE PROPIONATE 50 MCG/ACT NA SUSP: 2.0000 | Freq: Every day | NASAL | Status: DC

## 2016-06-18 MED ORDER — PHENYLEPHRINE HCL 10 MG/ML IJ SOLN
INTRAVENOUS | Status: DC | PRN
  Administered 2016-06-18: 25 ug/min via INTRAVENOUS

## 2016-06-18 MED ORDER — B-12 2500 MCG PO TABS: 1.0000 | ORAL_TABLET | Freq: Every day | ORAL | Status: DC

## 2016-06-18 MED ORDER — LORATADINE 10 MG PO TABS
10.0000 mg | ORAL_TABLET | Freq: Every day | ORAL | Status: DC
  Filled 2016-06-18: qty 1

## 2016-06-18 MED ORDER — ALPRAZOLAM 0.25 MG PO TABS
0.2500 mg | ORAL_TABLET | Freq: Every day | ORAL | Status: DC
  Administered 2016-06-18: 0.25 mg via ORAL
  Filled 2016-06-18: qty 1

## 2016-06-18 MED ORDER — ARTIFICIAL TEARS OP OINT
TOPICAL_OINTMENT | OPHTHALMIC | Status: AC
  Filled 2016-06-18: qty 3.5

## 2016-06-18 MED ORDER — PANTOPRAZOLE SODIUM 40 MG PO TBEC
40.0000 mg | DELAYED_RELEASE_TABLET | Freq: Every day | ORAL | Status: DC
Start: 2016-06-19 — End: 2016-06-19
  Filled 2016-06-18: qty 1

## 2016-06-18 MED ORDER — BACITRACIN ZINC 500 UNIT/GM EX OINT
TOPICAL_OINTMENT | CUTANEOUS | Status: AC
  Filled 2016-06-18: qty 28.35

## 2016-06-18 MED ORDER — OXYCODONE-ACETAMINOPHEN 5-325 MG PO TABS
1.0000 | ORAL_TABLET | ORAL | Status: DC | PRN
  Administered 2016-06-18 – 2016-06-19 (×2): 2 via ORAL
  Filled 2016-06-18 (×2): qty 2

## 2016-06-18 MED ORDER — BUPROPION HCL ER (XL) 300 MG PO TB24
300.0000 mg | ORAL_TABLET | Freq: Every day | ORAL | Status: DC
  Filled 2016-06-18: qty 1

## 2016-06-18 MED ORDER — TRIAMTERENE-HCTZ 37.5-25 MG PO TABS
1.0000 | ORAL_TABLET | Freq: Every day | ORAL | Status: DC
  Filled 2016-06-18: qty 1

## 2016-06-18 MED ORDER — HYDROMORPHONE HCL 1 MG/ML IJ SOLN
0.2500 mg | INTRAMUSCULAR | Status: DC | PRN
  Administered 2016-06-18 (×2): 0.5 mg via INTRAVENOUS

## 2016-06-18 MED ORDER — SODIUM CHLORIDE 0.9 % IR SOLN
Status: DC | PRN
  Administered 2016-06-18: 12:00:00

## 2016-06-18 MED ORDER — SODIUM CHLORIDE 0.9% FLUSH
3.0000 mL | INTRAVENOUS | Status: DC | PRN
Start: 2016-06-18 — End: 2016-06-19

## 2016-06-18 MED ORDER — ACETAMINOPHEN 650 MG RE SUPP: 650.0000 mg | RECTAL | Status: DC | PRN

## 2016-06-18 MED ORDER — ROCURONIUM BROMIDE 50 MG/5ML IV SOSY
PREFILLED_SYRINGE | INTRAVENOUS | Status: AC
  Filled 2016-06-18: qty 5

## 2016-06-18 MED ORDER — SUCCINYLCHOLINE CHLORIDE 200 MG/10ML IV SOSY
PREFILLED_SYRINGE | INTRAVENOUS | Status: AC
  Filled 2016-06-18: qty 10

## 2016-06-18 SURGICAL SUPPLY — 52 items
BAG DECANTER FOR FLEXI CONT (MISCELLANEOUS) ×2 IMPLANT
BENZOIN TINCTURE PRP APPL 2/3 (GAUZE/BANDAGES/DRESSINGS) ×2 IMPLANT
BLADE CLIPPER SURG (BLADE) IMPLANT
BUR MATCHSTICK NEURO 3.0 LAGG (BURR) ×2 IMPLANT
BUR PRECISION FLUTE 6.0 (BURR) ×4 IMPLANT
CANISTER SUCT 3000ML PPV (MISCELLANEOUS) ×2 IMPLANT
CARTRIDGE OIL MAESTRO DRILL (MISCELLANEOUS) ×1 IMPLANT
CONT SPEC 4OZ CLIKSEAL STRL BL (MISCELLANEOUS) ×6 IMPLANT
DIFFUSER DRILL AIR PNEUMATIC (MISCELLANEOUS) ×2 IMPLANT
DRAPE LAPAROTOMY 100X72X124 (DRAPES) ×2 IMPLANT
DRAPE MICROSCOPE LEICA (MISCELLANEOUS) ×4 IMPLANT
DRAPE POUCH INSTRU U-SHP 10X18 (DRAPES) ×2 IMPLANT
DRAPE SURG 17X23 STRL (DRAPES) ×8 IMPLANT
ELECT BLADE 4.0 EZ CLEAN MEGAD (MISCELLANEOUS) ×2
ELECT REM PT RETURN 9FT ADLT (ELECTROSURGICAL) ×2
ELECTRODE BLDE 4.0 EZ CLN MEGD (MISCELLANEOUS) ×1 IMPLANT
ELECTRODE REM PT RTRN 9FT ADLT (ELECTROSURGICAL) ×1 IMPLANT
GAUZE SPONGE 4X4 12PLY STRL (GAUZE/BANDAGES/DRESSINGS) ×2 IMPLANT
GAUZE SPONGE 4X4 16PLY XRAY LF (GAUZE/BANDAGES/DRESSINGS) IMPLANT
GLOVE BIO SURGEON STRL SZ8 (GLOVE) ×2 IMPLANT
GLOVE BIO SURGEON STRL SZ8.5 (GLOVE) ×2 IMPLANT
GLOVE ECLIPSE 6.5 STRL STRAW (GLOVE) ×2 IMPLANT
GLOVE EXAM NITRILE LRG STRL (GLOVE) IMPLANT
GLOVE EXAM NITRILE XL STR (GLOVE) IMPLANT
GLOVE EXAM NITRILE XS STR PU (GLOVE) IMPLANT
GOWN STRL REUS W/ TWL LRG LVL3 (GOWN DISPOSABLE) IMPLANT
GOWN STRL REUS W/ TWL XL LVL3 (GOWN DISPOSABLE) ×1 IMPLANT
GOWN STRL REUS W/TWL 2XL LVL3 (GOWN DISPOSABLE) IMPLANT
GOWN STRL REUS W/TWL LRG LVL3 (GOWN DISPOSABLE)
GOWN STRL REUS W/TWL XL LVL3 (GOWN DISPOSABLE) ×1
HEMOSTAT POWDER KIT SURGIFOAM (HEMOSTASIS) ×2 IMPLANT
KIT BASIN OR (CUSTOM PROCEDURE TRAY) ×2 IMPLANT
KIT ROOM TURNOVER OR (KITS) ×2 IMPLANT
NEEDLE HYPO 21X1.5 SAFETY (NEEDLE) IMPLANT
NEEDLE HYPO 22GX1.5 SAFETY (NEEDLE) ×2 IMPLANT
NS IRRIG 1000ML POUR BTL (IV SOLUTION) ×2 IMPLANT
OIL CARTRIDGE MAESTRO DRILL (MISCELLANEOUS) ×2
PACK LAMINECTOMY NEURO (CUSTOM PROCEDURE TRAY) ×2 IMPLANT
PAD ARMBOARD 7.5X6 YLW CONV (MISCELLANEOUS) ×6 IMPLANT
PATTIES SURGICAL .5 X1 (DISPOSABLE) IMPLANT
RUBBERBAND STERILE (MISCELLANEOUS) ×8 IMPLANT
SPONGE SURGIFOAM ABS GEL SZ50 (HEMOSTASIS) ×2 IMPLANT
STRIP CLOSURE SKIN 1/2X4 (GAUZE/BANDAGES/DRESSINGS) ×2 IMPLANT
SUT VIC AB 1 CT1 18XBRD ANBCTR (SUTURE) ×1 IMPLANT
SUT VIC AB 1 CT1 8-18 (SUTURE) ×1
SUT VIC AB 2-0 CP2 18 (SUTURE) ×2 IMPLANT
SWAB CULTURE LIQ STUART DBL (MISCELLANEOUS) ×2 IMPLANT
SWAB CULTURE LIQUID MINI MALE (MISCELLANEOUS) ×2 IMPLANT
TAPE CLOTH SURG 4X10 WHT LF (GAUZE/BANDAGES/DRESSINGS) ×2 IMPLANT
TOWEL OR 17X24 6PK STRL BLUE (TOWEL DISPOSABLE) ×2 IMPLANT
TOWEL OR 17X26 10 PK STRL BLUE (TOWEL DISPOSABLE) ×2 IMPLANT
WATER STERILE IRR 1000ML POUR (IV SOLUTION) ×2 IMPLANT

## 2016-06-18 NOTE — Progress Notes (Signed)
Pharmacy Antibiotic Note  Sydney Gillespie is a 76 y.o. 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 Now s/p  Left L3-4 hemilaminectomy/foraminotomy for resection of epidural mass,.  Pharmacy has been consulted for Vancomycin dosing for surgical prophylaxis in pt with MRSA positive nasal culture.  Afebrile, WBC wnl, SCR 1.69, CrCl ~ 31 ml/min  Preop Vancomycin 1000 mg IV x1 preop dose given today @ 11:00  Drains: None  Plan: Vancomycin 1000 mg IV x1 post op-give dose tomorrow AM @ 08:00, approximately 24h after the preop dose due to pt's estimated renal function.   Weight: 181 lb (82.1 kg)  Temp (24hrs), Avg:98 F (36.7 C), Min:97.6 F (36.4 C), Max:99.1 F (37.3 C)   Recent Labs Lab 06/12/16 0940 06/17/16 1143  WBC 9.6 8.1  CREATININE 1.73* 1.69*    Estimated Creatinine Clearance: 31.2 mL/min (by C-G formula based on SCr of 1.69 mg/dL (H)).    Allergies  Allergen Reactions  . Isoniazid Other (See Comments)    Blisters   . Keflex [Cephalexin] Other (See Comments)    Blisters      Thank you for allowing pharmacy to be a part of this patient's care. Nicole Cella, RPh Clinical Pharmacist 06/18/2016 3:43 PM

## 2016-06-18 NOTE — Anesthesia Procedure Notes (Signed)
Procedure Name: Intubation Date/Time: 06/18/2016 11:31 AM Performed by: Jacquiline Doe A Pre-anesthesia Checklist: Patient identified, Emergency Drugs available, Suction available and Patient being monitored Patient Re-evaluated:Patient Re-evaluated prior to inductionOxygen Delivery Method: Circle System Utilized and Circle system utilized Preoxygenation: Pre-oxygenation with 100% oxygen Intubation Type: IV induction Ventilation: Mask ventilation without difficulty Laryngoscope Size: Mac and 3 Grade View: Grade I Tube type: Oral Tube size: 7.5 mm Number of attempts: 1 Airway Equipment and Method: Stylet Placement Confirmation: ETT inserted through vocal cords under direct vision,  positive ETCO2 and breath sounds checked- equal and bilateral Secured at: 22 cm Tube secured with: Tape Dental Injury: Teeth and Oropharynx as per pre-operative assessment

## 2016-06-18 NOTE — Op Note (Signed)
Brief history: The patient is a 76 year old white female on whom I performed a L4-5 decompression, instrumentation, and fusion. She initially did very well but has developed back and left leg pain recently. She has failed medical management and was worked up with a lumbar MRI. 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 I discussed the situation with the patient and her husband. We discussed the various treatment options. She has decided to proceed with surgery after weighing the risks, benefits, and alternatives.  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 Earle Gell  Asst.: Dr. Cyndy Freeze  Anesthesia: Gen. endotracheal  Estimated blood loss: 100 mL  Drains: None  Complications: None  Description of procedure: The patient was brought to the operating room by the anesthesia team. General endotracheal anesthesia was induced. The patient was turned to the prone position on the Wilson frame. The patient's lumbosacral region was then prepared with Betadine scrub and Betadine solution. Sterile drapes were applied.  I then injected the area to be incised with Marcaine with epinephrine solution. I then used a scalpel to make a linear midline incision over the L3-4 intervertebral disc space. I then used electrocautery to perform a left sided subperiosteal dissection, dissecting through the scar tissue from a prior operation, exposing the spinous process and lamina of L3 and L4. We obtained intraoperative radiograph to confirm our location. I then inserted the [facility] retractor for exposure.  We then brought the operative microscope into the field. Under its  magnification and illumination we completed the microdissection. I used a high-speed drill to perform a laminotomy at L3-4 on the left. I then used a Kerrison punches to complete the left L3 hemilaminectomy and to widen the laminotomy and removed the ligamentum flavum at L3-4, we also removed the cephalad aspect of the L4 lamina. We encountered some abnormal fluid in the epidural space. We obtained cultures. We then used microdissection to free up the thecal sac and the left L3 and L4 nerve root from the epidural tissue. The epidural tissue/mass was quite adherent to the dura. It did not appeared to be a typical synovial cyst. It appeared more like granulation tissue. We are able to dissect the mass from the dura. We obtained multiple specimens for pathology. I then used a Kerrison punch to perform a foraminotomy at about the left L3 and L4 nerve root. At this point we have removed all the epidural mass we could see and the thecal sac and the left L3 and L4 nerve which were well decompressed.  We then obtained hemostasis using bipolar electrocautery. We irrigated the wound out with bacitracin solution. We then removed the retractor. We then reapproximated the patient's thoracolumbar fascia with interrupted #1 Vicryl suture. We then reapproximated the patient's subcutaneous tissue with interrupted 2-0 Vicryl suture. We then reapproximated patient's skin with Steri-Strips and benzoin. The was then coated with bacitracin ointment. The drapes were removed. The patient was subsequently returned to the supine position where they were extubated by the anesthesia team. The patient was then transported to the postanesthesia care unit in stable condition. All sponge instrument and needle counts were reportedly correct at the end of this case.

## 2016-06-18 NOTE — Transfer of Care (Signed)
Immediate Anesthesia Transfer of Care Note  Patient: Sydney Gillespie  Procedure(s) Performed: Procedure(s) with comments: LAMINECTOMY FOR FACET/SYNOVIAL CYST LUMBAR THREE - LUMBAR FOUR LEFT (Left) - LAMINECTOMY FOR FACET/SYNOVIAL CYST LUMBAR THREE - LUMBAR FOUR LEFT  Patient Location: PACU  Anesthesia Type:General  Level of Consciousness: awake, oriented, sedated, patient cooperative and responds to stimulation  Airway & Oxygen Therapy: Patient Spontanous Breathing and Patient connected to nasal cannula oxygen  Post-op Assessment: Report given to RN, Post -op Vital signs reviewed and stable, Patient moving all extremities and Patient moving all extremities X 4  Post vital signs: Reviewed and stable  Last Vitals:  Vitals:   06/18/16 0831  BP: (!) 155/59  Pulse: 84  Resp: 20  Temp: 37.3 C    Last Pain:  Vitals:   06/18/16 0902  PainSc: 6       Patients Stated Pain Goal: 7 (23/53/61 4431)  Complications: No apparent anesthesia complications

## 2016-06-18 NOTE — Anesthesia Preprocedure Evaluation (Addendum)
Anesthesia Evaluation  Patient identified by MRN, date of birth, ID band Patient awake    Reviewed: Allergy & Precautions, H&P , NPO status , Patient's Chart, lab work & pertinent test results, reviewed documented beta blocker date and time   Airway Mallampati: II  TM Distance: >3 FB Neck ROM: Full    Dental no notable dental hx. (+) Teeth Intact, Dental Advisory Given   Pulmonary neg pulmonary ROS,    Pulmonary exam normal breath sounds clear to auscultation       Cardiovascular hypertension, Pt. on medications and Pt. on home beta blockers + dysrhythmias  Rhythm:Regular Rate:Normal     Neuro/Psych negative neurological ROS  negative psych ROS   GI/Hepatic Neg liver ROS, GERD  Medicated and Controlled,  Endo/Other  Hypothyroidism   Renal/GU Renal InsufficiencyRenal disease  negative genitourinary   Musculoskeletal  (+) Arthritis , Rheumatoid disorders,    Abdominal   Peds  Hematology negative hematology ROS (+) anemia ,   Anesthesia Other Findings   Reproductive/Obstetrics negative OB ROS                           Anesthesia Physical Anesthesia Plan  ASA: III  Anesthesia Plan: General   Post-op Pain Management:    Induction: Intravenous  Airway Management Planned: Oral ETT  Additional Equipment:   Intra-op Plan:   Post-operative Plan: Extubation in OR  Informed Consent: I have reviewed the patients History and Physical, chart, labs and discussed the procedure including the risks, benefits and alternatives for the proposed anesthesia with the patient or authorized representative who has indicated his/her understanding and acceptance.   Dental advisory given  Plan Discussed with: CRNA  Anesthesia Plan Comments:         Anesthesia Quick Evaluation

## 2016-06-18 NOTE — Anesthesia Postprocedure Evaluation (Signed)
Anesthesia Post Note  Patient: MERCEDE ROLLO  Procedure(s) Performed: Procedure(s) (LRB): LAMINECTOMY FOR FACET/SYNOVIAL CYST LUMBAR THREE - LUMBAR FOUR LEFT (Left)  Patient location during evaluation: PACU Anesthesia Type: General Level of consciousness: awake and alert Pain management: pain level controlled Vital Signs Assessment: post-procedure vital signs reviewed and stable Respiratory status: spontaneous breathing, nonlabored ventilation and respiratory function stable Cardiovascular status: blood pressure returned to baseline and stable Postop Assessment: no signs of nausea or vomiting Anesthetic complications: no       Last Vitals:  Vitals:   06/18/16 1445 06/18/16 1500  BP: 118/63 121/67  Pulse: 75 78  Resp: 12 15  Temp:  36.4 C    Last Pain:  Vitals:   06/18/16 1500  PainSc: 2                  Najia Hurlbutt,W. EDMOND

## 2016-06-18 NOTE — H&P (Signed)
Subjective: Patient is a 76 year old white female on whom I previously performed an L4-5 decompression, instrumentation and fusion. The patient initially did well but has developed left leg pain. She has failed medical management. 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 I discussed the situation with the patient and her husband. We discussed the various treatment options. She has decided to proceed with surgery.  Past Medical History:  Diagnosis Date  . Allergic rhinitis   . Allergy   . Anemia   . Barrett's esophagus   . Blood dyscrasia    multiple myloma remission  . Cancer ([place])   . Compression fracture 2013  . Degenerative disc disease, lumbar   . Dysrhythmia    palpitations  . Family history of adverse reaction to anesthesia    nausea -mom  . GERD (gastroesophageal reflux disease)   . Gout   . 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[place])   . Heart palpitations   . Hyperlipidemia   . Hypertension   . Hypothyroidism   . Lumbar radiculitis   . Lumbar stenosis with neurogenic claudication   . Multiple myeloma ([place])   . Osteoporosis   . Renal insufficiency   . Rheumatoid arthritis ([place])   . Rheumatoid arthritis [facility])     Past Surgical History:  Procedure Laterality Date  . ABDOMINAL HYSTERECTOMY    . APPENDECTOMY    . BACK SURGERY    . BREAST EXCISIONAL BIOPSY Bilateral    neg  . CHOLECYSTECTOMY      Allergies  Allergen Reactions  . Isoniazid Other (See Comments)    Blisters   . Keflex [Cephalexin] Other (See Comments)    Blisters     Social History  Substance Use Topics  . Smoking status: Never Smoker  . Smokeless tobacco: Never Used  . Alcohol use No    Family History  Problem Relation Age of Onset  . Cancer Father   . Breast cancer Maternal Grandmother 40   Prior to Admission medications   Medication Sig Start Date End Date Taking? Authorizing Provider  allopurinol (ZYLOPRIM) 300 MG tablet Take 300 mg by mouth daily.   09/14/14  Yes Historical Provider, MD  ALPRAZolam Duanne Moron) 0.25 MG tablet Take 0.25 mg by mouth at bedtime.  10/18/15  Yes Historical Provider, MD  atenolol (TENORMIN) 50 MG tablet Take 50 mg by mouth daily.  10/17/13  Yes Historical Provider, MD  buPROPion (WELLBUTRIN XL) 300 MG 24 hr tablet Take 300 mg by mouth daily.   Yes Historical Provider, MD  cetirizine (ZYRTEC) 10 MG tablet Take 10 mg by mouth 2 (two) times daily.    Yes Historical Provider, MD  Cyanocobalamin (B-12) 2500 MCG TABS Take 1 tablet by mouth daily.   Yes Historical Provider, MD  fexofenadine (ALLEGRA) 180 MG tablet Take 180 mg by mouth 2 (two) times daily.   Yes Historical Provider, MD  fluticasone (FLONASE) 50 MCG/ACT nasal spray Place 2 sprays into the nose daily.    Yes Historical Provider, MD  hydroxychloroquine (PLAQUENIL) 200 MG tablet TAKE 2 TABLETS BY MOUTH DAILY 08/20/14  Yes Historical Provider, MD  IRON PO Take 1 tablet by mouth daily.   Yes Historical Provider, MD  lansoprazole (PREVACID) 30 MG capsule Take 30 mg by mouth daily at 12 noon.    Yes Historical Provider, MD  levothyroxine (SYNTHROID, LEVOTHROID) 50 MCG tablet Take 50 mcg by mouth daily before breakfast.   Yes Historical Provider, MD  oxyCODONE-acetaminophen (PERCOCET)  10-325 MG tablet Take 1 tablet by mouth every 4 (four) hours as needed for pain. 06/10/16  Yes Historical Provider, MD  Potassium 99 MG TABS Take 99 mg by mouth daily.   Yes Historical Provider, MD  triamterene-hydrochlorothiazide (MAXZIDE-25) 37.5-25 MG per tablet TAKE 1 TABLET BY MOUTH DAILY AS NEEDED FOR WATER RETENTION   Yes Historical Provider, MD  cyclobenzaprine (FLEXERIL) 10 MG tablet Take 1 tablet (10 mg total) by mouth 3 (three) times daily as needed for muscle spasms. 11/27/15   Newman Pies, MD  docusate sodium (COLACE) 100 MG capsule Take 1 capsule (100 mg total) by mouth 2 (two) times daily. Patient not taking: Reported on 03/19/2016 11/27/15   Newman Pies, MD  leflunomide (ARAVA)  10 MG tablet Take 10 tablets by mouth daily. 04/14/15   Historical Provider, MD  oxyCODONE-acetaminophen (PERCOCET/ROXICET) 5-325 MG tablet Take 1-2 tablets by mouth every 4 (four) hours as needed for moderate pain. Patient not taking: Reported on 03/19/2016 11/27/15   Newman Pies, MD     Review of Systems  Positive ROS: As above  All other systems have been reviewed and were otherwise negative with the exception of those mentioned in the HPI and as above.  Objective: Vital signs in last 24 hours: Temp:  [98.2 F (36.8 C)-99.1 F (37.3 C)] 99.1 F (37.3 C) (02/08 0831) Pulse Rate:  [84-86] 84 (02/08 0831) Resp:  [20] 20 (02/08 0831) BP: (99-155)/(59-66) 155/59 (02/08 0831) SpO2:  [95 %-100 %] 100 % (02/08 0831) Weight:  [82.1 kg (181 lb)] 82.1 kg (181 lb) (02/08 0831)  General Appearance: Alert Head: Normocephalic, without obvious abnormality, atraumatic Eyes: PERRL, conjunctiva/corneas clear, EOM's intact,    Ears: Normal  Throat: Normal  Neck: Supple, Back: unremarkable, her lumbar incision is well-healed. Lungs: Clear to auscultation bilaterally, respirations unlabored Heart: Regular rate and rhythm, no murmur, rub or gallop Abdomen: Soft, non-tender Extremities: Extremities normal, atraumatic, no cyanosis or edema Skin: unremarkable  NEUROLOGIC:   Mental status: alert and oriented,Motor Exam - grossly normal Sensory Exam - grossly normal Reflexes:  Coordination - grossly normal Gait - grossly normal Balance - grossly normal Cranial Nerves: I: smell Not tested  II: visual acuity  OS: Normal  OD: Normal   II: visual fields Full to confrontation  II: pupils Equal, round, reactive to light  III,VII: ptosis None  III,IV,VI: extraocular muscles  Full ROM  V: mastication Normal  V: facial light touch sensation  Normal  V,VII: corneal reflex  Present  VII: facial muscle function - upper  Normal  VII: facial muscle function - lower Normal  VIII: hearing Not tested   IX: soft palate elevation  Normal  IX,X: gag reflex Present  XI: trapezius strength  5/5  XI: sternocleidomastoid strength 5/5  XI: neck flexion strength  5/5  XII: tongue strength  Normal    Data Review Lab Results  Component Value Date   WBC 8.1 06/17/2016   HGB 10.2 (L) 06/17/2016   HCT 32.7 (L) 06/17/2016   MCV 93.4 06/17/2016   PLT 224 06/17/2016   Lab Results  Component Value Date   NA 140 06/17/2016   K 3.6 06/17/2016   CL 106 06/17/2016   CO2 23 06/17/2016   BUN 18 06/17/2016   CREATININE 1.69 (H) 06/17/2016   GLUCOSE 105 (H) 06/17/2016   No results found for: INR, PROTIME  Assessment/Plan: 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  I have reviewed her MRI scan with them and pointed out the abnormalities. We have discussed the various treatment options including surgery. I have described the surgical treatment option of a left L3-4 laminotomy foraminotomy for resection of the synovial cyst. I have shown them surgical models. We have discussed the risks, benefits, alternatives, expected postoperative course, and likelihood of achieving her goals with surgery. I have answered all the patient's, and her husbands, questions. She has decided to proceed with surgery.   , D 06/18/2016 11:00 AM

## 2016-06-19 ENCOUNTER — Ambulatory Visit: Payer: Medicare Other | Admitting: Internal Medicine

## 2016-06-19 ENCOUNTER — Encounter (HOSPITAL_COMMUNITY): Payer: Self-pay | Admitting: Neurosurgery

## 2016-06-19 LAB — CBC
HCT: 27.8 % — ABNORMAL LOW (ref 36.0–46.0)
Hemoglobin: 8.7 g/dL — ABNORMAL LOW (ref 12.0–15.0)
MCH: 29 pg (ref 26.0–34.0)
MCHC: 31.3 g/dL (ref 30.0–36.0)
MCV: 92.7 fL (ref 78.0–100.0)
Platelets: 196 10*3/uL (ref 150–400)
RBC: 3 MIL/uL — ABNORMAL LOW (ref 3.87–5.11)
RDW: 13.9 % (ref 11.5–15.5)
WBC: 8 10*3/uL (ref 4.0–10.5)

## 2016-06-19 LAB — BASIC METABOLIC PANEL
Anion gap: 11 (ref 5–15)
BUN: 18 mg/dL (ref 6–20)
CO2: 23 mmol/L (ref 22–32)
Calcium: 7.3 mg/dL — ABNORMAL LOW (ref 8.9–10.3)
Chloride: 101 mmol/L (ref 101–111)
Creatinine, Ser: 1.39 mg/dL — ABNORMAL HIGH (ref 0.44–1.00)
GFR calc Af Amer: 42 mL/min — ABNORMAL LOW (ref >60)
GFR calc non Af Amer: 36 mL/min — ABNORMAL LOW (ref >60)
Glucose, Bld: 141 mg/dL — ABNORMAL HIGH (ref 65–99)
Potassium: 4 mmol/L (ref 3.5–5.1)
Sodium: 135 mmol/L (ref 135–145)

## 2016-06-19 MED ORDER — DOCUSATE SODIUM 100 MG PO CAPS: 100.0000 mg | ORAL_CAPSULE | Freq: Two times a day (BID) | ORAL | 0 refills | Status: DC

## 2016-06-19 MED ORDER — OXYCODONE-ACETAMINOPHEN 10-325 MG PO TABS: 1.0000 | ORAL_TABLET | ORAL | 0 refills | Status: DC | PRN

## 2016-06-19 NOTE — Discharge Summary (Signed)
Physician Discharge Summary  Patient ID: Sydney Gillespie MRN: [id number] DOB/AGE: Jun 13, 1940 76 y.o.  Admit date: 06/18/2016 Discharge date: 06/19/2016  Admission Diagnoses:Lumbar spinal stenosis, lumbar radiculopathy  Discharge Diagnoses: The same Active Problems:   Lumbar radiculopathy   Discharged Condition: good  Hospital Course: I performed a left L3-4 hemilaminectomy with resection of a presumed synovial cyst. Cultures thus far are negative.  The patient's postoperative course was unremarkable. On postoperative day #1 the patient requested discharge to home. She was given written and oral discharge instructions. All her questions were answered.  Consults: Physical therapy Significant Diagnostic Studies: Wound cultures Treatments: Left L3-4 hemilaminectomy Discharge Exam: Blood pressure (!) 97/47, pulse 90, temperature 97.8 F (36.6 C), temperature source Oral, resp. rate 18, weight 82.1 kg (181 lb), SpO2 99 %. The patient is alert and pleasant. Her strength is normal in her lower extremities. She looks well.  Disposition: Home  Discharge Instructions    Call MD for:  difficulty breathing, headache or visual disturbances    Complete by:  As directed    Call MD for:  extreme fatigue    Complete by:  As directed    Call MD for:  hives    Complete by:  As directed    Call MD for:  persistant dizziness or light-headedness    Complete by:  As directed    Call MD for:  persistant nausea and vomiting    Complete by:  As directed    Call MD for:  redness, tenderness, or signs of infection (pain, swelling, redness, odor or green/yellow discharge around incision site)    Complete by:  As directed    Call MD for:  severe uncontrolled pain    Complete by:  As directed    Call MD for:  temperature >100.4    Complete by:  As directed    Diet - low sodium heart healthy    Complete by:  As directed    Discharge instructions    Complete by:  As directed    Call [phone] for  a followup appointment. Take a stool softener while you are using pain medications.   Driving Restrictions    Complete by:  As directed    Do not drive for 2 weeks.   Increase activity slowly    Complete by:  As directed    Lifting restrictions    Complete by:  As directed    Do not lift more than 5 pounds. No excessive bending or twisting.   May shower / Bathe    Complete by:  As directed    He may shower after the pain she is removed 3 days after surgery. Leave the incision alone.   Remove dressing in 48 hours    Complete by:  As directed    Your stitches are under the scan and will dissolve by themselves. The Steri-Strips will fall off after you take a few showers. Do not rub back or pick at the wound, Leave the wound alone.     Allergies as of 06/19/2016      Reactions   Isoniazid Other (See Comments)   Blisters    Keflex [cephalexin] Other (See Comments)   Blisters      Medication List    STOP taking these medications   oxyCODONE-acetaminophen 5-325 MG tablet Commonly known as:  PERCOCET/ROXICET Replaced by:  oxyCODONE-acetaminophen 10-325 MG tablet You also have another medication with the same name that you need to continue taking as instructed.  TAKE these medications   allopurinol 300 MG tablet Commonly known as:  ZYLOPRIM Take 300 mg by mouth daily.   ALPRAZolam 0.25 MG tablet Commonly known as:  XANAX Take 0.25 mg by mouth at bedtime.   atenolol 50 MG tablet Commonly known as:  TENORMIN Take 50 mg by mouth daily.   B-12 2500 MCG Tabs Take 1 tablet by mouth daily.   buPROPion 300 MG 24 hr tablet Commonly known as:  WELLBUTRIN XL Take 300 mg by mouth daily.   cetirizine 10 MG tablet Commonly known as:  ZYRTEC Take 10 mg by mouth 2 (two) times daily.   cyclobenzaprine 10 MG tablet Commonly known as:  FLEXERIL Take 1 tablet (10 mg total) by mouth 3 (three) times daily as needed for muscle spasms.   docusate sodium 100 MG capsule Commonly known  as:  COLACE Take 1 capsule (100 mg total) by mouth 2 (two) times daily. What changed:  Another medication with the same name was added. Make sure you understand how and when to take each.   docusate sodium 100 MG capsule Commonly known as:  COLACE Take 1 capsule (100 mg total) by mouth 2 (two) times daily. What changed:  You were already taking a medication with the same name, and this prescription was added. Make sure you understand how and when to take each.   fexofenadine 180 MG tablet Commonly known as:  ALLEGRA Take 180 mg by mouth 2 (two) times daily.   fluticasone 50 MCG/ACT nasal spray Commonly known as:  FLONASE Place 2 sprays into the nose daily.   hydroxychloroquine 200 MG tablet Commonly known as:  PLAQUENIL TAKE 2 TABLETS BY MOUTH DAILY   IRON PO Take 1 tablet by mouth daily.   lansoprazole 30 MG capsule Commonly known as:  PREVACID Take 30 mg by mouth daily at 12 noon.   leflunomide 10 MG tablet Commonly known as:  ARAVA Take 10 tablets by mouth daily.   levothyroxine 50 MCG tablet Commonly known as:  SYNTHROID, LEVOTHROID Take 50 mcg by mouth daily before breakfast.   oxyCODONE-acetaminophen 10-325 MG tablet Commonly known as:  PERCOCET Take 1 tablet by mouth every 4 (four) hours as needed for pain. What changed:  Another medication with the same name was added. Make sure you understand how and when to take each.  Another medication with the same name was removed. Continue taking this medication, and follow the directions you see here.   oxyCODONE-acetaminophen 10-325 MG tablet Commonly known as:  PERCOCET Take 1 tablet by mouth every 4 (four) hours as needed for pain. What changed:  You were already taking a medication with the same name, and this prescription was added. Make sure you understand how and when to take each. Replaces:  oxyCODONE-acetaminophen 5-325 MG tablet   Potassium 99 MG Tabs Take 99 mg by mouth daily.    triamterene-hydrochlorothiazide 37.5-25 MG tablet Commonly known as:  MAXZIDE-25 TAKE 1 TABLET BY MOUTH DAILY AS NEEDED FOR WATER RETENTION        Signed: Ophelia Charter 06/19/2016, 7:49 AM

## 2016-06-19 NOTE — Progress Notes (Signed)
Pt given D/C instructions with Rx's, verbal understanding was provided. Pt's incision is clean and dry with no sign of infection. Pt's IV was removed prior to D/C. Pt D/C'd home via wheelchair @ 1105 per MD order. Pt is stable @ D/C and has no other needs at this time. Holli Humbles, RN

## 2016-06-19 NOTE — Discharge Instructions (Signed)

## 2016-06-19 NOTE — Evaluation (Signed)
Physical Therapy Evaluation/ Discharge Patient Details Name: Sydney Gillespie MRN: [id number] DOB: Mar 24, 1941 Today's Date: 06/19/2016   History of Present Illness  Pt is a 76 yo admitted for L3-4 lami. PMHx: L4-5 fusion, CA, gout, HTN, RA  Clinical Impression  Pt very pleasant, aware of precautions from prior surgery and able to maintain precautions with activity. Pt utilizing golfers lift to reach for items. Pt without brace present stating it is at home and she has been cleared to ambulate in hallway. Pt with incisional pain as her only deficit with no noted strength or balance deficits. Pt educated for all precautions, mobility, limited sitting time with ability to return demonstrate. Pt without further needs with pt aware and agreeable.     Follow Up Recommendations No PT follow up    Equipment Recommendations  None recommended by PT    Recommendations for Other Services       Precautions / Restrictions Precautions Precautions: Back Precaution Booklet Issued: Yes (comment) Required Braces or Orthoses: Spinal Brace Spinal Brace: Lumbar corset;Applied in sitting position (Pt wearing brace upon arrival) Restrictions Weight Bearing Restrictions: No      Mobility  Bed Mobility Overal bed mobility: Modified Independent                Transfers Overall transfer level: Modified independent                  Ambulation/Gait Ambulation/Gait assistance: Modified independent (Device/Increase time) Ambulation Distance (Feet): 500 Feet Assistive device: None Gait Pattern/deviations: WFL(Within Functional Limits)   Gait velocity interpretation: at or above normal speed for age/gender General Gait Details: pt with steady gait with good speed and no cues for posture  Stairs Stairs: Yes Stairs assistance: Modified independent (Device/Increase time) Stair Management: Two rails;Alternating pattern;Forwards Number of Stairs: 4 General stair comments: pt with safe stair  ambulation  Wheelchair Mobility    Modified Rankin (Stroke Patients Only)       Balance Overall balance assessment: No apparent balance deficits (not formally assessed)                                           Pertinent Vitals/Pain      Home Living Family/patient expects to be discharged to:: Private residence Living Arrangements: Spouse/significant other Available Help at Discharge: Family;Available 24 hours/day Type of Home: House Home Access: Stairs to enter Entrance Stairs-Rails: Can reach both Entrance Stairs-Number of Steps: 6 Home Layout: Able to live on main level with bedroom/bathroom Home Equipment: Shower seat      Prior Function Level of Independence: Independent               Hand Dominance        Extremity/Trunk Assessment   Upper Extremity Assessment Upper Extremity Assessment: Defer to OT evaluation    Lower Extremity Assessment Lower Extremity Assessment: Overall WFL for tasks assessed    Cervical / Trunk Assessment Cervical / Trunk Assessment: Kyphotic  Communication   Communication: No difficulties  Cognition Arousal/Alertness: Awake/alert Behavior During Therapy: WFL for tasks assessed/performed Overall Cognitive Status: Within Functional Limits for tasks assessed                      General Comments General comments (skin integrity, edema, etc.): pt reports one fall in the last year tripping over a pillow when mad at great granddaughter  Exercises     Assessment/Plan    PT Assessment Patent does not need any further PT services  PT Problem List            PT Treatment Interventions      PT Goals (Current goals can be found in the Care Plan section)  Acute Rehab PT Goals PT Goal Formulation: All assessment and education complete, DC therapy    Frequency     Barriers to discharge        Co-evaluation               End of Session Equipment Utilized During Treatment:  (pt  without brace) Activity Tolerance: Patient tolerated treatment well Patient left: in chair;with call bell/phone within reach Nurse Communication: Mobility status;Precautions         Time: 4332-9518 PT Time Calculation (min) (ACUTE ONLY): 15 min   Charges:   PT Evaluation $PT Eval Low Complexity: 1 Procedure     PT G Codes:        Zakkary Thibault B Brennin Durfee July 18, 2016, 9:27 AM Elwyn Reach, [place]

## 2016-06-23 LAB — AEROBIC/ANAEROBIC CULTURE (SURGICAL/DEEP WOUND)

## 2016-06-23 LAB — AEROBIC/ANAEROBIC CULTURE W GRAM STAIN (SURGICAL/DEEP WOUND)

## 2016-06-26 ENCOUNTER — Other Ambulatory Visit: Payer: Self-pay | Admitting: *Deleted

## 2016-06-26 DIAGNOSIS — C9001 Multiple myeloma in remission: Secondary | ICD-10-CM

## 2016-06-26 LAB — VIRUS CULTURE

## 2016-06-29 ENCOUNTER — Inpatient Hospital Stay (HOSPITAL_BASED_OUTPATIENT_CLINIC_OR_DEPARTMENT_OTHER): Payer: Medicare Other | Admitting: Internal Medicine

## 2016-06-29 ENCOUNTER — Inpatient Hospital Stay: Payer: Medicare Other

## 2016-06-29 DIAGNOSIS — G8929 Other chronic pain: Secondary | ICD-10-CM | POA: Diagnosis not present

## 2016-06-29 DIAGNOSIS — I129 Hypertensive chronic kidney disease with stage 1 through stage 4 chronic kidney disease, or unspecified chronic kidney disease: Secondary | ICD-10-CM

## 2016-06-29 DIAGNOSIS — C9001 Multiple myeloma in remission: Secondary | ICD-10-CM | POA: Diagnosis not present

## 2016-06-29 DIAGNOSIS — M069 Rheumatoid arthritis, unspecified: Secondary | ICD-10-CM

## 2016-06-29 DIAGNOSIS — M549 Dorsalgia, unspecified: Secondary | ICD-10-CM

## 2016-06-29 DIAGNOSIS — N189 Chronic kidney disease, unspecified: Secondary | ICD-10-CM | POA: Diagnosis not present

## 2016-06-29 DIAGNOSIS — R110 Nausea: Secondary | ICD-10-CM | POA: Diagnosis not present

## 2016-06-29 DIAGNOSIS — R630 Anorexia: Secondary | ICD-10-CM | POA: Diagnosis not present

## 2016-06-29 DIAGNOSIS — D649 Anemia, unspecified: Secondary | ICD-10-CM

## 2016-06-29 DIAGNOSIS — Z79899 Other long term (current) drug therapy: Secondary | ICD-10-CM

## 2016-06-29 DIAGNOSIS — N281 Cyst of kidney, acquired: Secondary | ICD-10-CM

## 2016-06-29 DIAGNOSIS — E039 Hypothyroidism, unspecified: Secondary | ICD-10-CM

## 2016-06-29 LAB — BASIC METABOLIC PANEL
Anion gap: 10 (ref 5–15)
BUN: 17 mg/dL (ref 6–20)
CO2: 23 mmol/L (ref 22–32)
Calcium: 8.2 mg/dL — ABNORMAL LOW (ref 8.9–10.3)
Chloride: 104 mmol/L (ref 101–111)
Creatinine, Ser: 1.68 mg/dL — ABNORMAL HIGH (ref 0.44–1.00)
GFR calc Af Amer: 33 mL/min — ABNORMAL LOW (ref >60)
GFR calc non Af Amer: 28 mL/min — ABNORMAL LOW (ref >60)
Glucose, Bld: 114 mg/dL — ABNORMAL HIGH (ref 65–99)
Potassium: 4 mmol/L (ref 3.5–5.1)
Sodium: 137 mmol/L (ref 135–145)

## 2016-06-29 LAB — CBC WITH DIFFERENTIAL/PLATELET
Basophils Absolute: 0.1 10*3/uL (ref 0–0.1)
Basophils Relative: 1 %
Eosinophils Absolute: 0.2 10*3/uL (ref 0–0.7)
Eosinophils Relative: 3 %
HCT: 29.8 % — ABNORMAL LOW (ref 35.0–47.0)
Hemoglobin: 9.9 g/dL — ABNORMAL LOW (ref 12.0–16.0)
Lymphocytes Relative: 11 %
Lymphs Abs: 0.7 10*3/uL — ABNORMAL LOW (ref 1.0–3.6)
MCH: 29.9 pg (ref 26.0–34.0)
MCHC: 33.1 g/dL (ref 32.0–36.0)
MCV: 90.2 fL (ref 80.0–100.0)
Monocytes Absolute: 0.5 10*3/uL (ref 0.2–0.9)
Monocytes Relative: 7 %
Neutro Abs: 5.3 10*3/uL (ref 1.4–6.5)
Neutrophils Relative %: 78 %
Platelets: 316 10*3/uL (ref 150–440)
RBC: 3.3 MIL/uL — ABNORMAL LOW (ref 3.80–5.20)
RDW: 14.4 % (ref 11.5–14.5)
WBC: 6.8 10*3/uL (ref 3.6–11.0)

## 2016-06-29 NOTE — Progress Notes (Signed)
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 benign     Multiple myeloma ([place])   11/01/2014 Initial Diagnosis    Multiple myeloma (H[facility])       Multiple myeloma in remission ([place])   12/18/2015 Initial Diagnosis    Multiple myeloma in remission [facility])            INTERVAL HISTORY: This is a pleasant 76 year old female patient with above history of multiple myeloma currently on surveillance/no maintenance therapy- here for follow-up.  Patient had previously started on iron and potassium pills by her PCP with significant improvement noted in her fatigue. Her only complaint today is slight nausea from a 30-day course of Cipro after infection noted at surgical site of L3-4 cyst removal.  Her appetite is currently poor due to antibiotics, but no weight loss.  No vomiting, constipation, diarrhea, hematuria, or hematochezia.  Patient denies any unusual swelling in the legs or tingling or numbness. No headache, chest pain, or shortness of breath.   REVIEW OF SYSTEMS:   10 point review of system is done is negative for mentioned above and in history of present illness  I have reviewed the past medical history, past surgical history, social history and family history with the patient and they are unchanged from previous note unless stated above.  ALLERGIES:  is allergic to isoniazid and keflex [cephalexin].  MEDICATIONS:  Current Outpatient Prescriptions  Medication Sig Dispense Refill  . allopurinol (ZYLOPRIM) 300 MG tablet Take 300 mg by mouth daily.     Marland Kitchen  ALPRAZolam (XANAX) 0.25 MG tablet Take 0.25 mg by mouth at bedtime.   0  . atenolol (TENORMIN) 50 MG tablet Take 50 mg by mouth daily.     Marland Kitchen buPROPion (WELLBUTRIN XL) 300 MG 24 hr tablet Take 300 mg by mouth daily.    . cetirizine (ZYRTEC) 10 MG tablet Take 10 mg by mouth 2 (two) times daily.     . ciprofloxacin (CIPRO) 750 MG tablet Take 1 tablet by mouth daily.    . Cyanocobalamin (B-12) 2500 MCG TABS Take 1 tablet by mouth daily.    . fexofenadine (ALLEGRA) 180 MG tablet Take 180 mg by mouth 2 (two) times daily.    . fluticasone (FLONASE) 50 MCG/ACT nasal spray Place 2 sprays into the nose daily.     . hydroxychloroquine (PLAQUENIL) 200 MG tablet TAKE 2 TABLETS BY MOUTH DAILY    . IRON PO Take 1 tablet by mouth daily.    . lansoprazole (PREVACID) 30 MG capsule Take 30 mg by mouth daily at 12 noon.     . leflunomide (ARAVA) 10 MG tablet Take 10 tablets by mouth daily.  1  . levothyroxine (SYNTHROID, LEVOTHROID) 50 MCG tablet Take 50 mcg by mouth daily before breakfast.    . oxyCODONE-acetaminophen (PERCOCET) 10-325 MG tablet Take 1 tablet by mouth every 4 (four) hours as needed for pain. 50 tablet 0  . Potassium 99 MG TABS Take 99 mg by mouth daily.    Marland Kitchen triamterene-hydrochlorothiazide (MAXZIDE-25) 37.5-25 MG per tablet TAKE 1  TABLET BY MOUTH DAILY AS NEEDED FOR WATER RETENTION     No current facility-administered medications for this visit.     PHYSICAL EXAMINATION: ECOG PERFORMANCE STATUS: 0 - Asymptomatic  BP 122/72 (BP Location: Left Arm, Patient Position: Sitting)   Pulse 76   Temp 97.6 F (36.4 C) (Tympanic)   Resp 18   Ht _0  (1.702 m)   Wt 177 lb 11.2 oz (80.6 kg)   BMI 27.83 kg/m   Filed Weights   06/29/16 1141  Weight: 177 lb 11.2 oz (80.6 kg)    GENERAL:alert, no distress and comfortable.Accompanied from family. EYES: normal, Conjunctiva are pink and non-injected, sclera clear OROPHARYNX:no exudate, no erythema and lips, buccal mucosa, and tongue normal  NECK:  No thyromegaly LYMPH:  no palpable lymphadenopathy in the cervical, axillary or inguinal LUNGS: clear to auscultation with normal breathing effort; No Wheeze or crackles  Cardio-vascular- Regular Rate & Rythm and no murmurs and no lower extremity edema ABDOMEN:abdomen soft, non-tender and normal bowel sounds; No hepato-splenomegaly.  Musculoskeletal:no cyanosis of digits and no clubbing  NEURO: alert & oriented x 3 with fluent speech, no focal motor/sensory deficits. SKIN: ecchymosis bilateral upper extremities [chronic]  LABORATORY DATA:  I have reviewed the data as listed    Component Value Date/Time   NA 137 06/29/2016 1052   NA 142 08/16/2013 1028   K 4.0 06/29/2016 1052   K 4.5 08/16/2013 1028   CL 104 06/29/2016 1052   CL 106 08/16/2013 1028   CO2 23 06/29/2016 1052   CO2 27 08/16/2013 1028   GLUCOSE 114 (H) 06/29/2016 1052   GLUCOSE 99 08/16/2013 1028   BUN 17 06/29/2016 1052   BUN 21 (H) 08/16/2013 1028   CREATININE 1.68 (H) 06/29/2016 1052   CREATININE 1.42 (H) 09/07/2014 0954   CALCIUM 8.2 (L) 06/29/2016 1052   CALCIUM 9.3 09/07/2014 0954   PROT 7.7 01/29/2016 1139   PROT 7.4 08/16/2013 1028   ALBUMIN 3.8 01/29/2016 1139   ALBUMIN 3.9 08/16/2013 1028   AST 29 01/29/2016 1139   AST 27 08/16/2013 1028   ALT 15 01/29/2016 1139   ALT 15 08/16/2013 1028   ALKPHOS 67 01/29/2016 1139   ALKPHOS 61 08/16/2013 1028   BILITOT 0.4 01/29/2016 1139   BILITOT 0.3 08/16/2013 1028   GFRNONAA 28 (L) 06/29/2016 1052   GFRNONAA 36 (L) 09/07/2014 0954   GFRAA 33 (L) 06/29/2016 1052   GFRAA 42 (L) 09/07/2014 0954    No results found for: SPEP, UPEP  Lab Results  Component Value Date   WBC 6.8 06/29/2016   NEUTROABS 5.3 06/29/2016   HGB 9.9 (L) 06/29/2016   HCT 29.8 (L) 06/29/2016   MCV 90.2 06/29/2016   PLT 316 06/29/2016      Chemistry      Component Value Date/Time   NA 137 06/29/2016 1052   NA 142 08/16/2013 1028   K 4.0 06/29/2016 1052   K 4.5 08/16/2013 1028    CL 104 06/29/2016 1052   CL 106 08/16/2013 1028   CO2 23 06/29/2016 1052   CO2 27 08/16/2013 1028   BUN 17 06/29/2016 1052   BUN 21 (H) 08/16/2013 1028   CREATININE 1.68 (H) 06/29/2016 1052   CREATININE 1.42 (H) 09/07/2014 0954      Component Value Date/Time   CALCIUM 8.2 (L) 06/29/2016 1052   CALCIUM 9.3 09/07/2014 0954   ALKPHOS 67 01/29/2016 1139   ALKPHOS 61 08/16/2013 1028   AST 29 01/29/2016 1139  AST 27 08/16/2013 1028   ALT 15 01/29/2016 1139   ALT 15 08/16/2013 1028   BILITOT 0.4 01/29/2016 1139   BILITOT 0.3 08/16/2013 1028       RADIOGRAPHIC STUDIES: I have personally reviewed the radiological images as listed and agreed with the findings in the report. No results found.   ASSESSMENT & PLAN:    Multiple myeloma in remission [facility]) Multiple myeloma diagnosed in 2012 status post autologous stem cell transplant  2013 is currently on surveillance- not on maintenance therapy. OCT  2017 M protein absent; kappa lambda light chain ratio slightly abnormal 1.7. Patient seems to be in remission at this time.   # Hypothyroidism- as per PCP.   # Chronic kidney disease- creatinine around 1.6. Stable.  % Anemia - Improved, Hgb 9.9. Continue taking iron pills 2-3 times/week.  % Nausea - may prefer cold/non-hot foods at this time or allow hot food odors to dissipate prior to eating  # Follow up in 3 months/myeloma labs/iron;ferritin.   Lucendia Herrlich, NP  06/29/16 4:26 PM

## 2016-06-29 NOTE — Progress Notes (Signed)
Patient here for follow-up with multiple myleloma. Patient is recovering well from her back surgery. Other than generalized stiffness in her back, she reports no other medical complaints.

## 2016-06-29 NOTE — Assessment & Plan Note (Addendum)
Multiple myeloma diagnosed in 2012 status post autologous stem cell transplant  2013 is currently on surveillance- not on maintenance therapy. OCT  2017 M protein absent; kappa lambda light chain ratio slightly abnormal 1.7. Patient seems to be in remission at this time.   # Hypothyroidism- as per PCP.   # Chronic kidney disease- creatinine around 1.6. Stable.  % Anemia - Improved, Hgb 9.9. Continue taking iron pills 2-3 times/week.  % Nausea - may prefer cold/non-hot foods at this time or allow hot food odors to dissipate prior to eating  # Follow up in 3 months/myeloma labs/iron;ferritin.

## 2016-07-02 LAB — SAMPLE TO BLOOD BANK

## 2016-07-16 LAB — FUNGUS CULTURE WITH STAIN

## 2016-07-16 LAB — FUNGAL ORGANISM REFLEX

## 2016-07-16 LAB — FUNGUS CULTURE RESULT

## 2016-09-24 ENCOUNTER — Other Ambulatory Visit: Payer: Self-pay | Admitting: *Deleted

## 2016-09-24 DIAGNOSIS — C9001 Multiple myeloma in remission: Secondary | ICD-10-CM

## 2016-09-25 ENCOUNTER — Inpatient Hospital Stay: Payer: Medicare Other | Attending: Internal Medicine

## 2016-09-25 ENCOUNTER — Other Ambulatory Visit: Payer: Self-pay

## 2016-09-25 DIAGNOSIS — C9001 Multiple myeloma in remission: Secondary | ICD-10-CM | POA: Insufficient documentation

## 2016-09-25 DIAGNOSIS — N281 Cyst of kidney, acquired: Secondary | ICD-10-CM | POA: Insufficient documentation

## 2016-09-25 DIAGNOSIS — M069 Rheumatoid arthritis, unspecified: Secondary | ICD-10-CM | POA: Insufficient documentation

## 2016-09-25 DIAGNOSIS — D631 Anemia in chronic kidney disease: Secondary | ICD-10-CM | POA: Diagnosis not present

## 2016-09-25 DIAGNOSIS — G8929 Other chronic pain: Secondary | ICD-10-CM | POA: Insufficient documentation

## 2016-09-25 DIAGNOSIS — Z79899 Other long term (current) drug therapy: Secondary | ICD-10-CM | POA: Diagnosis not present

## 2016-09-25 DIAGNOSIS — I129 Hypertensive chronic kidney disease with stage 1 through stage 4 chronic kidney disease, or unspecified chronic kidney disease: Secondary | ICD-10-CM | POA: Insufficient documentation

## 2016-09-25 DIAGNOSIS — M549 Dorsalgia, unspecified: Secondary | ICD-10-CM | POA: Diagnosis not present

## 2016-09-25 DIAGNOSIS — Z9481 Bone marrow transplant status: Secondary | ICD-10-CM | POA: Insufficient documentation

## 2016-09-25 DIAGNOSIS — N183 Chronic kidney disease, stage 3 (moderate): Secondary | ICD-10-CM | POA: Diagnosis not present

## 2016-09-25 LAB — BASIC METABOLIC PANEL
Anion gap: 9 (ref 5–15)
BUN: 28 mg/dL — ABNORMAL HIGH (ref 6–20)
CO2: 19 mmol/L — ABNORMAL LOW (ref 22–32)
Calcium: 8.6 mg/dL — ABNORMAL LOW (ref 8.9–10.3)
Chloride: 106 mmol/L (ref 101–111)
Creatinine, Ser: 2.04 mg/dL — ABNORMAL HIGH (ref 0.44–1.00)
GFR calc Af Amer: 26 mL/min — ABNORMAL LOW (ref >60)
GFR calc non Af Amer: 23 mL/min — ABNORMAL LOW (ref >60)
Glucose, Bld: 109 mg/dL — ABNORMAL HIGH (ref 65–99)
Potassium: 3.2 mmol/L — ABNORMAL LOW (ref 3.5–5.1)
Sodium: 134 mmol/L — ABNORMAL LOW (ref 135–145)

## 2016-09-25 LAB — CBC WITH DIFFERENTIAL/PLATELET
Basophils Absolute: 0 10*3/uL (ref 0–0.1)
Basophils Relative: 1 %
Eosinophils Absolute: 0.1 10*3/uL (ref 0–0.7)
Eosinophils Relative: 1 %
HCT: 37.1 % (ref 35.0–47.0)
Hemoglobin: 12.4 g/dL (ref 12.0–16.0)
Lymphocytes Relative: 18 %
Lymphs Abs: 1.2 10*3/uL (ref 1.0–3.6)
MCH: 30.6 pg (ref 26.0–34.0)
MCHC: 33.5 g/dL (ref 32.0–36.0)
MCV: 91.4 fL (ref 80.0–100.0)
Monocytes Absolute: 0.5 10*3/uL (ref 0.2–0.9)
Monocytes Relative: 7 %
Neutro Abs: 4.9 10*3/uL (ref 1.4–6.5)
Neutrophils Relative %: 73 %
Platelets: 215 10*3/uL (ref 150–440)
RBC: 4.05 MIL/uL (ref 3.80–5.20)
RDW: 16.2 % — ABNORMAL HIGH (ref 11.5–14.5)
WBC: 6.7 10*3/uL (ref 3.6–11.0)

## 2016-09-25 LAB — IRON AND TIBC
Iron: 57 ug/dL (ref 28–170)
Saturation Ratios: 19 % (ref 10.4–31.8)
TIBC: 296 ug/dL (ref 250–450)
UIBC: 239 ug/dL

## 2016-09-25 LAB — FERRITIN: Ferritin: 84 ng/mL (ref 11–307)

## 2016-09-28 ENCOUNTER — Inpatient Hospital Stay (HOSPITAL_BASED_OUTPATIENT_CLINIC_OR_DEPARTMENT_OTHER): Payer: Medicare Other | Admitting: Internal Medicine

## 2016-09-28 ENCOUNTER — Encounter: Payer: Self-pay | Admitting: Internal Medicine

## 2016-09-28 VITALS — BP 145/72 | HR 90 | Temp 98.0°F | Wt 177.2 lb

## 2016-09-28 DIAGNOSIS — G8929 Other chronic pain: Secondary | ICD-10-CM | POA: Diagnosis not present

## 2016-09-28 DIAGNOSIS — N183 Chronic kidney disease, stage 3 (moderate): Secondary | ICD-10-CM

## 2016-09-28 DIAGNOSIS — C9001 Multiple myeloma in remission: Secondary | ICD-10-CM

## 2016-09-28 DIAGNOSIS — Z79899 Other long term (current) drug therapy: Secondary | ICD-10-CM | POA: Diagnosis not present

## 2016-09-28 DIAGNOSIS — I129 Hypertensive chronic kidney disease with stage 1 through stage 4 chronic kidney disease, or unspecified chronic kidney disease: Secondary | ICD-10-CM

## 2016-09-28 DIAGNOSIS — N281 Cyst of kidney, acquired: Secondary | ICD-10-CM | POA: Diagnosis not present

## 2016-09-28 DIAGNOSIS — D631 Anemia in chronic kidney disease: Secondary | ICD-10-CM | POA: Diagnosis not present

## 2016-09-28 DIAGNOSIS — M549 Dorsalgia, unspecified: Secondary | ICD-10-CM | POA: Diagnosis not present

## 2016-09-28 DIAGNOSIS — M069 Rheumatoid arthritis, unspecified: Secondary | ICD-10-CM | POA: Diagnosis not present

## 2016-09-28 DIAGNOSIS — Z9481 Bone marrow transplant status: Secondary | ICD-10-CM

## 2016-09-28 LAB — KAPPA/LAMBDA LIGHT CHAINS
Kappa free light chain: 45.8 mg/L — ABNORMAL HIGH (ref 3.3–19.4)
Kappa, lambda light chain ratio: 1.88 — ABNORMAL HIGH (ref 0.26–1.65)
Lambda free light chains: 24.4 mg/L (ref 5.7–26.3)

## 2016-09-28 NOTE — Progress Notes (Signed)
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 benign     Multiple myeloma in remission ([place])   12/18/2015 Initial Diagnosis    Multiple myeloma in remission [facility])            INTERVAL HISTORY: This is a pleasant 76 year-old female patient with above history of multiple  Myeloma currently on surveillance/no maintenance therapy- Is here for follow-up.  Patient denies any unusual swelling in the legs or tingling or numbness. Appetite is good. No weight loss. Patient denies any worsening pain anywhere. Denies any nausea vomiting. Chronic mild fatigue. Denies any blood in stools or black stools.   REVIEW OF SYSTEMS:   10 point review of system is done is negative for mentioned above and in history of present illness  I have reviewed the past medical history, past surgical history, social history and family history with the patient and they are unchanged from previous note unless stated above.  ALLERGIES:  is allergic to isoniazid and keflex [cephalexin].  MEDICATIONS:  Current Outpatient Prescriptions  Medication Sig Dispense Refill  . allopurinol (ZYLOPRIM) 300 MG tablet Take 300 mg by mouth daily.     Marland Kitchen ALPRAZolam (XANAX) 0.25 MG tablet Take 0.25 mg by mouth at bedtime.   0  . atenolol (TENORMIN) 50 MG tablet Take 50 mg by mouth daily.     Marland Kitchen buPROPion (WELLBUTRIN XL) 300 MG 24 hr tablet Take 300 mg by mouth daily.    . cetirizine (ZYRTEC) 10 MG tablet Take 10 mg by mouth 2 (two) times daily.     . ciprofloxacin (CIPRO) 750 MG tablet Take 1 tablet by mouth  daily.    . Cyanocobalamin (B-12) 2500 MCG TABS Take 1 tablet by mouth daily.    . fexofenadine (ALLEGRA) 180 MG tablet Take 180 mg by mouth 2 (two) times daily.    . fluticasone (FLONASE) 50 MCG/ACT nasal spray Place 2 sprays into the nose daily.     . hydroxychloroquine (PLAQUENIL) 200 MG tablet TAKE 2 TABLETS BY MOUTH DAILY    . IRON PO Take 1 tablet by mouth daily.    . lansoprazole (PREVACID) 30 MG capsule Take 30 mg by mouth daily at 12 noon.     . leflunomide (ARAVA) 10 MG tablet Take 10 tablets by mouth daily.  1  . levothyroxine (SYNTHROID, LEVOTHROID) 50 MCG tablet Take 50 mcg by mouth daily before breakfast.    . oxyCODONE-acetaminophen (PERCOCET) 10-325 MG tablet Take 1 tablet by mouth every 4 (four) hours as needed for pain. 50 tablet 0  . Potassium 99 MG TABS Take 99 mg by mouth daily.    Marland Kitchen triamterene-hydrochlorothiazide (MAXZIDE-25) 37.5-25 MG per tablet TAKE 1 TABLET BY MOUTH DAILY AS NEEDED FOR WATER RETENTION     No current facility-administered medications for this visit.     PHYSICAL EXAMINATION: ECOG PERFORMANCE STATUS: 0 - Asymptomatic  BP (!) 145/72 (BP Location: Left Arm, Patient Position: Sitting)   Pulse 90   Temp 98 F (36.7 C) (Tympanic)   Wt 177 lb 3.2 oz (  80.4 kg)   BMI 27.75 kg/m   Filed Weights   09/28/16 1046  Weight: 177 lb 3.2 oz (80.4 kg)    GENERAL:alert, no distress and comfortable.Accompanied from family. EYES: normal, Conjunctiva are pink and non-injected, sclera clear OROPHARYNX:no exudate, no erythema and lips, buccal mucosa, and tongue normal  NECK: No thyromegaly LYMPH:  no palpable lymphadenopathy in the cervical, axillary or inguinal LUNGS: clear to auscultation with normal breathing effort; No Wheeze or crackles  Cardio-vascular- Regular Rate & Rythm and no murmurs and no lower extremity edema ABDOMEN:abdomen soft, non-tender and normal bowel sounds; No hepato-splenomegaly.  Musculoskeletal:no cyanosis of digits and no clubbing   NEURO: alert & oriented x 3 with fluent speech, no focal motor/sensory deficits. SKIN: ecchymosis bilateral upper extremities [chronic]  LABORATORY DATA:  I have reviewed the data as listed    Component Value Date/Time   NA 134 (L) 09/25/2016 1237   NA 142 08/16/2013 1028   K 3.2 (L) 09/25/2016 1237   K 4.5 08/16/2013 1028   CL 106 09/25/2016 1237   CL 106 08/16/2013 1028   CO2 19 (L) 09/25/2016 1237   CO2 27 08/16/2013 1028   GLUCOSE 109 (H) 09/25/2016 1237   GLUCOSE 99 08/16/2013 1028   BUN 28 (H) 09/25/2016 1237   BUN 21 (H) 08/16/2013 1028   CREATININE 2.04 (H) 09/25/2016 1237   CREATININE 1.42 (H) 09/07/2014 0954   CALCIUM 8.6 (L) 09/25/2016 1237   CALCIUM 9.3 09/07/2014 0954   PROT 7.7 01/29/2016 1139   PROT 7.4 08/16/2013 1028   ALBUMIN 3.8 01/29/2016 1139   ALBUMIN 3.9 08/16/2013 1028   AST 29 01/29/2016 1139   AST 27 08/16/2013 1028   ALT 15 01/29/2016 1139   ALT 15 08/16/2013 1028   ALKPHOS 67 01/29/2016 1139   ALKPHOS 61 08/16/2013 1028   BILITOT 0.4 01/29/2016 1139   BILITOT 0.3 08/16/2013 1028   GFRNONAA 23 (L) 09/25/2016 1237   GFRNONAA 36 (L) 09/07/2014 0954   GFRAA 26 (L) 09/25/2016 1237   GFRAA 42 (L) 09/07/2014 0954    No results found for: SPEP, UPEP  Lab Results  Component Value Date   WBC 6.7 09/25/2016   NEUTROABS 4.9 09/25/2016   HGB 12.4 09/25/2016   HCT 37.1 09/25/2016   MCV 91.4 09/25/2016   PLT 215 09/25/2016      Chemistry      Component Value Date/Time   NA 134 (L) 09/25/2016 1237   NA 142 08/16/2013 1028   K 3.2 (L) 09/25/2016 1237   K 4.5 08/16/2013 1028   CL 106 09/25/2016 1237   CL 106 08/16/2013 1028   CO2 19 (L) 09/25/2016 1237   CO2 27 08/16/2013 1028   BUN 28 (H) 09/25/2016 1237   BUN 21 (H) 08/16/2013 1028   CREATININE 2.04 (H) 09/25/2016 1237   CREATININE 1.42 (H) 09/07/2014 0954      Component Value Date/Time   CALCIUM 8.6 (L) 09/25/2016 1237   CALCIUM 9.3 09/07/2014 0954   ALKPHOS 67 01/29/2016 1139    ALKPHOS 61 08/16/2013 1028   AST 29 01/29/2016 1139   AST 27 08/16/2013 1028   ALT 15 01/29/2016 1139   ALT 15 08/16/2013 1028   BILITOT 0.4 01/29/2016 1139   BILITOT 0.3 08/16/2013 1028       RADIOGRAPHIC STUDIES: I have personally reviewed the radiological images as listed and agreed with the findings in the report. No results found.   ASSESSMENT & PLAN:    Multiple  myeloma in remission [facility]) Multiple myeloma diagnosed in 2012 status post autologous stem cell transplant  2013 is currently on surveillance- not on maintenance therapy. FEB  2018 M protein absent; kappa lambda light chain ratio slightly abnormal 1.65.  Awaiting on M- protein from today; K/L= 1.85 [slightly abnormal].  # Chronic kidney disease-III- creatinine around ~2.0 /slighly worse. [Baseline around 1.7.]. Recommend increased fluid intake/ appointment with Dr. Candiss Norse.   # Anemia/secondary to CKD - Improved, Hgb 12.2 ; mild iron deficiency. Recommend taking iron pills 2-3 times/week.  # Follow up in 3 months/myeloma labs/iron;ferritin- 1 week prior.     Cc: Dr.Singh.     Cammie Sickle, MD 09/29/2016 9:02 AM

## 2016-09-28 NOTE — Assessment & Plan Note (Addendum)
Multiple myeloma diagnosed in 2012 status post autologous stem cell transplant  2013 is currently on surveillance- not on maintenance therapy. FEB  2018 M protein absent; kappa lambda light chain ratio slightly abnormal 1.65.  Awaiting on M- protein from today; K/L= 1.85 [slightly abnormal].  # Chronic kidney disease-III- creatinine around ~2.0 /slighly worse. [Baseline around 1.7.]. Recommend increased fluid intake/ appointment with Dr. Candiss Norse.   # Anemia/secondary to CKD - Improved, Hgb 12.2 ; mild iron deficiency. Recommend taking iron pills 2-3 times/week.  # Follow up in 3 months/myeloma labs/iron;ferritin- 1 week prior.     Cc: Dr.Singh.

## 2016-09-28 NOTE — Progress Notes (Signed)
Patient here for follow up. States she has been having pain in her right buttocks that radiates to her feet making her toes tingeling.

## 2016-09-29 LAB — MULTIPLE MYELOMA PANEL, SERUM
Albumin SerPl Elph-Mcnc: 3.9 g/dL (ref 2.9–4.4)
Albumin/Glob SerPl: 1.3 (ref 0.7–1.7)
Alpha 1: 0.3 g/dL (ref 0.0–0.4)
Alpha2 Glob SerPl Elph-Mcnc: 1.1 g/dL — ABNORMAL HIGH (ref 0.4–1.0)
B-Globulin SerPl Elph-Mcnc: 1 g/dL (ref 0.7–1.3)
Gamma Glob SerPl Elph-Mcnc: 0.8 g/dL (ref 0.4–1.8)
Globulin, Total: 3.1 g/dL (ref 2.2–3.9)
IgA: 64 mg/dL (ref 64–422)
IgG (Immunoglobin G), Serum: 804 mg/dL (ref 700–1600)
IgM, Serum: 17 mg/dL — ABNORMAL LOW (ref 26–217)
Total Protein ELP: 7 g/dL (ref 6.0–8.5)

## 2016-10-13 ENCOUNTER — Other Ambulatory Visit: Payer: Self-pay | Admitting: Nurse Practitioner

## 2016-10-13 DIAGNOSIS — R140 Abdominal distension (gaseous): Secondary | ICD-10-CM

## 2016-10-16 ENCOUNTER — Ambulatory Visit
Admission: RE | Admit: 2016-10-16 | Discharge: 2016-10-16 | Disposition: A | Discharge status: Home / Self Care | Payer: Medicare Other | Source: Ambulatory Visit | Attending: Nurse Practitioner | Admitting: Nurse Practitioner

## 2016-10-16 DIAGNOSIS — K219 Gastro-esophageal reflux disease without esophagitis: Secondary | ICD-10-CM | POA: Insufficient documentation

## 2016-10-16 DIAGNOSIS — K449 Diaphragmatic hernia without obstruction or gangrene: Secondary | ICD-10-CM | POA: Diagnosis not present

## 2016-10-16 DIAGNOSIS — R140 Abdominal distension (gaseous): Secondary | ICD-10-CM | POA: Insufficient documentation

## 2016-11-02 ENCOUNTER — Other Ambulatory Visit: Payer: Self-pay | Admitting: Rheumatology

## 2016-11-02 DIAGNOSIS — M542 Cervicalgia: Secondary | ICD-10-CM

## 2016-11-03 ENCOUNTER — Ambulatory Visit
Admission: RE | Admit: 2016-11-03 | Discharge: 2016-11-03 | Disposition: A | Discharge status: Home / Self Care | Payer: Medicare Other | Source: Ambulatory Visit | Attending: Rheumatology | Admitting: Rheumatology

## 2016-11-03 DIAGNOSIS — M50223 Other cervical disc displacement at C6-C7 level: Secondary | ICD-10-CM | POA: Insufficient documentation

## 2016-11-03 DIAGNOSIS — M5031 Other cervical disc degeneration,  high cervical region: Secondary | ICD-10-CM | POA: Insufficient documentation

## 2016-11-03 DIAGNOSIS — M4802 Spinal stenosis, cervical region: Secondary | ICD-10-CM | POA: Insufficient documentation

## 2016-11-03 DIAGNOSIS — M2578 Osteophyte, vertebrae: Secondary | ICD-10-CM | POA: Insufficient documentation

## 2016-11-03 DIAGNOSIS — M47892 Other spondylosis, cervical region: Secondary | ICD-10-CM | POA: Insufficient documentation

## 2016-11-03 DIAGNOSIS — M542 Cervicalgia: Secondary | ICD-10-CM

## 2016-11-10 ENCOUNTER — Ambulatory Visit: Payer: Medicare Other

## 2016-11-12 ENCOUNTER — Ambulatory Visit
Admission: RE | Admit: 2016-11-12 | Discharge: 2016-11-12 | Disposition: A | Discharge status: Home / Self Care | Payer: Medicare Other | Source: Ambulatory Visit | Attending: Unknown Physician Specialty | Admitting: Unknown Physician Specialty

## 2016-11-12 ENCOUNTER — Ambulatory Visit: Payer: Medicare Other | Admitting: Anesthesiology

## 2016-11-12 ENCOUNTER — Encounter: Payer: Self-pay | Admitting: *Deleted

## 2016-11-12 ENCOUNTER — Encounter
Admission: RE | Disposition: A | Discharge status: Home / Self Care | Payer: Self-pay | Source: Ambulatory Visit | Attending: Unknown Physician Specialty

## 2016-11-12 DIAGNOSIS — K259 Gastric ulcer, unspecified as acute or chronic, without hemorrhage or perforation: Secondary | ICD-10-CM | POA: Insufficient documentation

## 2016-11-12 DIAGNOSIS — E039 Hypothyroidism, unspecified: Secondary | ICD-10-CM | POA: Diagnosis not present

## 2016-11-12 DIAGNOSIS — I10 Essential (primary) hypertension: Secondary | ICD-10-CM | POA: Diagnosis not present

## 2016-11-12 DIAGNOSIS — E785 Hyperlipidemia, unspecified: Secondary | ICD-10-CM | POA: Insufficient documentation

## 2016-11-12 DIAGNOSIS — K319 Disease of stomach and duodenum, unspecified: Secondary | ICD-10-CM | POA: Diagnosis not present

## 2016-11-12 DIAGNOSIS — Z79891 Long term (current) use of opiate analgesic: Secondary | ICD-10-CM | POA: Diagnosis not present

## 2016-11-12 DIAGNOSIS — K2980 Duodenitis without bleeding: Secondary | ICD-10-CM | POA: Diagnosis not present

## 2016-11-12 DIAGNOSIS — Z86010 Personal history of colonic polyps: Secondary | ICD-10-CM | POA: Insufficient documentation

## 2016-11-12 DIAGNOSIS — M069 Rheumatoid arthritis, unspecified: Secondary | ICD-10-CM | POA: Insufficient documentation

## 2016-11-12 DIAGNOSIS — M810 Age-related osteoporosis without current pathological fracture: Secondary | ICD-10-CM | POA: Diagnosis not present

## 2016-11-12 DIAGNOSIS — Z7952 Long term (current) use of systemic steroids: Secondary | ICD-10-CM | POA: Diagnosis not present

## 2016-11-12 DIAGNOSIS — K648 Other hemorrhoids: Secondary | ICD-10-CM | POA: Diagnosis not present

## 2016-11-12 DIAGNOSIS — K2960 Other gastritis without bleeding: Secondary | ICD-10-CM | POA: Diagnosis not present

## 2016-11-12 DIAGNOSIS — K219 Gastro-esophageal reflux disease without esophagitis: Secondary | ICD-10-CM | POA: Diagnosis not present

## 2016-11-12 DIAGNOSIS — Z1211 Encounter for screening for malignant neoplasm of colon: Secondary | ICD-10-CM | POA: Insufficient documentation

## 2016-11-12 DIAGNOSIS — K222 Esophageal obstruction: Secondary | ICD-10-CM | POA: Diagnosis not present

## 2016-11-12 DIAGNOSIS — Z79899 Other long term (current) drug therapy: Secondary | ICD-10-CM | POA: Diagnosis not present

## 2016-11-12 HISTORY — DX: Dizziness and giddiness: R42

## 2016-11-12 HISTORY — DX: Nonspecific reaction to tuberculin skin test without active tuberculosis: R76.11

## 2016-11-12 HISTORY — DX: Low back pain, unspecified: M54.50

## 2016-11-12 HISTORY — DX: Gastro-esophageal reflux disease without esophagitis: K21.9

## 2016-11-12 HISTORY — PX: COLONOSCOPY WITH PROPOFOL: SHX5780

## 2016-11-12 HISTORY — DX: Other seasonal allergic rhinitis: J30.2

## 2016-11-12 HISTORY — DX: Other dysphagia: R13.19

## 2016-11-12 HISTORY — DX: Inflammatory polyarthropathy: M06.4

## 2016-11-12 HISTORY — DX: Palpitations: R00.2

## 2016-11-12 HISTORY — DX: Change in bowel habit: R19.4

## 2016-11-12 HISTORY — PX: ESOPHAGOGASTRODUODENOSCOPY (EGD) WITH PROPOFOL: SHX5813

## 2016-11-12 HISTORY — DX: Low back pain: M54.5

## 2016-11-12 HISTORY — DX: Other specified disorders of bone density and structure, unspecified site: M85.80

## 2016-11-12 SURGERY — ESOPHAGOGASTRODUODENOSCOPY (EGD) WITH PROPOFOL
Anesthesia: General

## 2016-11-12 MED ORDER — LIDOCAINE HCL (CARDIAC) 20 MG/ML IV SOLN
INTRAVENOUS | Status: DC | PRN
  Administered 2016-11-12: 30 mg via INTRAVENOUS

## 2016-11-12 MED ORDER — GLYCOPYRROLATE 0.2 MG/ML IJ SOLN
INTRAMUSCULAR | Status: DC | PRN
  Administered 2016-11-12: 0.1 mg via INTRAVENOUS

## 2016-11-12 MED ORDER — MIDAZOLAM HCL 2 MG/2ML IJ SOLN
INTRAMUSCULAR | Status: DC | PRN
  Administered 2016-11-12: 2 mg via INTRAVENOUS

## 2016-11-12 MED ORDER — SODIUM CHLORIDE 0.9 % IV SOLN
INTRAVENOUS | Status: DC
  Administered 2016-11-12: 14:00:00 via INTRAVENOUS

## 2016-11-12 MED ORDER — FENTANYL CITRATE (PF) 100 MCG/2ML IJ SOLN
INTRAMUSCULAR | Status: DC | PRN
  Administered 2016-11-12: 50 ug via INTRAVENOUS

## 2016-11-12 MED ORDER — MIDAZOLAM HCL 2 MG/2ML IJ SOLN
INTRAMUSCULAR | Status: AC
  Filled 2016-11-12: qty 2

## 2016-11-12 MED ORDER — FENTANYL CITRATE (PF) 100 MCG/2ML IJ SOLN
INTRAMUSCULAR | Status: AC
  Filled 2016-11-12: qty 2

## 2016-11-12 MED ORDER — SODIUM CHLORIDE 0.9 % IV SOLN: INTRAVENOUS | Status: DC

## 2016-11-12 MED ORDER — PROPOFOL 500 MG/50ML IV EMUL
INTRAVENOUS | Status: AC
  Filled 2016-11-12: qty 50

## 2016-11-12 MED ORDER — PROPOFOL 500 MG/50ML IV EMUL
INTRAVENOUS | Status: DC | PRN
  Administered 2016-11-12: 120 ug/kg/min via INTRAVENOUS

## 2016-11-12 NOTE — H&P (Signed)
Primary Care Physician:  [name], MD Primary Gastroenterologist:  Dr. Vira Agar  Pre-Procedure History & Physical: HPI:  Sydney Gillespie is a 76 y.o. female is here for an endoscopy and colonoscopy.   Past Medical History:  Diagnosis Date  . Allergic rhinitis   . Allergy   . Anemia   . Anemia   . Barrett's esophagus   . Blood dyscrasia    multiple myloma remission  . Cancer ([place])   . Change in bowel habits   . Compression fracture 2013  . Degenerative disc disease, lumbar   . Dysrhythmia    palpitations  . Esophageal reflux   . Family history of adverse reaction to anesthesia    nausea -mom  . GERD (gastroesophageal reflux disease)   . Gout   . 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[place])   . Heart palpitations   . Hyperlipidemia   . Hypothyroidism   . Inflammatory polyarthropathy ([place])   . Lumbago   . Lumbar radiculitis   . Lumbar stenosis with neurogenic claudication   . Multiple myeloma ([place])   . Multiple myeloma ([place])   . Osteopenia   . Osteoporosis   . Other dysphagia   . Palpitations   . Positive PPD   . Renal insufficiency   . Rheumatoid arthritis ([place])   . Rheumatoid arthritis ([place])   . Seasonal allergies   . Vertigo     Past Surgical History:  Procedure Laterality Date  . ABDOMINAL HYSTERECTOMY    . APPENDECTOMY    . BACK SURGERY    . BREAST EXCISIONAL BIOPSY Bilateral    neg  . CHOLECYSTECTOMY    . FOOT SURGERY    . LUMBAR LAMINECTOMY/DECOMPRESSION MICRODISCECTOMY Left 06/18/2016   Procedure: LAMINECTOMY FOR FACET/SYNOVIAL CYST LUMBAR THREE - LUMBAR FOUR LEFT;  Surgeon: Newman Pies, MD;  Location: [place];  Service: Neurosurgery;  Laterality: Left;  LAMINECTOMY FOR FACET/SYNOVIAL CYST LUMBAR THREE - LUMBAR FOUR LEFT    Prior to Admission medications   Medication Sig Start Date End Date Taking? Authorizing Provider  allopurinol (ZYLOPRIM) 300 MG tablet Take 300 mg by mouth daily.  09/14/14  Yes [provider]  ALPRAZolam (XANAX) 0.25  MG tablet Take 0.25 mg by mouth at bedtime.  10/18/15  Yes [provider]  atenolol (TENORMIN) 50 MG tablet Take 50 mg by mouth daily.  10/17/13  Yes [provider]  buPROPion (WELLBUTRIN XL) 300 MG 24 hr tablet Take 300 mg by mouth daily.   Yes [provider]  cetirizine (ZYRTEC) 10 MG tablet Take 10 mg by mouth 2 (two) times daily.    Yes [provider]  Cyanocobalamin (B-12) 2500 MCG TABS Take 1 tablet by mouth daily.   Yes [provider]  diclofenac sodium (VOLTAREN) 1 % GEL Apply 2 g topically 4 (four) times daily.   Yes [provider]  escitalopram (LEXAPRO) 10 MG tablet Take 10 mg by mouth daily.   Yes [provider]  fexofenadine (ALLEGRA) 180 MG tablet Take 180 mg by mouth 2 (two) times daily.   Yes [provider]  fluticasone (FLONASE) 50 MCG/ACT nasal spray Place 2 sprays into the nose daily.    Yes [provider]  hydroxychloroquine (PLAQUENIL) 200 MG tablet TAKE 2 TABLETS BY MOUTH DAILY 08/20/14  Yes [provider]  IRON PO Take 1 tablet by mouth daily.   Yes [provider]  lansoprazole (PREVACID) 30 MG capsule Take 30 mg by mouth daily at 12  noon.    Yes [provider]  leflunomide (ARAVA) 10 MG tablet Take 10 tablets by mouth daily. 04/14/15  Yes [provider]  oxyCODONE-acetaminophen (PERCOCET) 10-325 MG tablet Take 1 tablet by mouth every 4 (four) hours as needed for pain. 06/19/16  Yes Newman Pies, MD  Potassium 99 MG TABS Take 99 mg by mouth daily.   Yes [provider]  traMADol (ULTRAM) 50 MG tablet Take 50 mg by mouth every 6 (six) hours as needed.   Yes [provider]  triamterene-hydrochlorothiazide (MAXZIDE-25) 37.5-25 MG per tablet TAKE 1 TABLET BY MOUTH DAILY AS NEEDED FOR WATER RETENTION   Yes [provider]  ciprofloxacin (CIPRO) 750 MG tablet Take 1 tablet by mouth daily. 06/26/16   [provider]   levothyroxine (SYNTHROID, LEVOTHROID) 50 MCG tablet Take 50 mcg by mouth daily before breakfast.    [provider]    Allergies as of 10/19/2016 - Review Complete 09/28/2016  Allergen Reaction Noted  . Isoniazid Other (See Comments) 11/02/2014  . Keflex [cephalexin] Other (See Comments) 01/29/2016    Family History  Problem Relation Age of Onset  . Hypertension Mother   . Heart attack Mother   . Rheum arthritis Mother   . Osteoarthritis Mother   . Cancer Father   . Hypertension Father   . Stroke Father   . Breast cancer Maternal Grandmother 28  . Osteoarthritis Other   . Rheum arthritis Other     Social History   Social History  . Marital status: Married    Spouse name: N/A  . Number of children: N/A  . Years of education: N/A   Occupational History  . Not on file.   Social History Main Topics  . Smoking status: Never Smoker  . Smokeless tobacco: Never Used  . Alcohol use No  . Drug use: No  . Sexual activity: Yes   Other Topics Concern  . Not on file   Social History Narrative  . No narrative on file    Review of Systems: See HPI, otherwise negative ROS  Physical Exam: BP (!) 154/67   Pulse 70   Temp (!) 96.1 F (35.6 C) (Tympanic)   Resp 18   Ht _0  (1.702 m)   Wt 80.3 kg (177 lb)   SpO2 95%   BMI 27.72 kg/m  General:   Alert,  pleasant and cooperative in NAD Head:  Normocephalic and atraumatic. Neck:  Supple; no masses or thyromegaly. Lungs:  Clear throughout to auscultation.    Heart:  Regular rate and rhythm. Abdomen:  Soft, nontender and nondistended. Normal bowel sounds, without guarding, and without rebound.   Neurologic:  Alert and  oriented x4;  grossly normal neurologically.  Impression/Plan: Sydney Gillespie is here for an endoscopy and colonoscopy to be performed for [facility] colon polyps and GERD, dyspepsia.  Risks, benefits, limitations, and alternatives regarding  endoscopy and colonoscopy have been reviewed with the  patient.  Questions have been answered.  All parties agreeable.   Gaylyn Cheers, MD  11/12/2016, 2:50 PM

## 2016-11-12 NOTE — Transfer of Care (Signed)
Immediate Anesthesia Transfer of Care Note  Patient: Sydney Gillespie  Procedure(s) Performed: Procedure(s): ESOPHAGOGASTRODUODENOSCOPY (EGD) WITH PROPOFOL (N/A) COLONOSCOPY WITH PROPOFOL (N/A)  Patient Location: PACU and Endoscopy Unit  Anesthesia Type:General  Level of Consciousness: sedated and responds to stimulation  Airway & Oxygen Therapy: Patient Spontanous Breathing and Patient connected to nasal cannula oxygen  Post-op Assessment: Report given to RN and Post -op Vital signs reviewed and stable  Post vital signs: Reviewed and stable  Last Vitals:  Vitals:   11/12/16 1542 11/12/16 1544  BP: (!) 89/53 (!) 95/53  Pulse: 60 60  Resp: 13 16  Temp: (!) 35.9 C     Last Pain:  Vitals:   11/12/16 1542  TempSrc: Tympanic  PainSc: Asleep      Patients Stated Pain Goal: 0 (16/07/37 1062)  Complications: No apparent anesthesia complications

## 2016-11-12 NOTE — Op Note (Addendum)
[facility] Gastroenterology Patient Name: Sydney Gillespie Procedure Date: 11/12/2016 2:32 PM MRN: [id number] Account #: [id number] Date of Birth: 1941-03-17 Admit Type: Outpatient Age: 76 Room: [facility] ENDO ROOM 1 Gender: Female Note Status: Finalized Procedure:            Upper GI endoscopy Indications:          Dysphagia Providers:            Manya Silvas, MD Referring MD:         [name], MD (Referring MD) Medicines:            Propofol per Anesthesia Complications:        No immediate complications. Procedure:            Pre-Anesthesia Assessment:                       - After reviewing the risks and benefits, the patient                        was deemed in satisfactory condition to undergo the                        procedure.                       After obtaining informed consent, the endoscope was                        passed under direct vision. Throughout the procedure,                        the patient's blood pressure, pulse, and oxygen                        saturations were monitored continuously. The Endoscope                        was introduced through the mouth, and advanced to the                        second part of duodenum. The upper GI endoscopy was                        accomplished without difficulty. The patient tolerated                        the procedure well. Findings:      The examined esophagus was normal. No evidence of esophageal narrowing       seen.      Few non-bleeding superficial gastric ulcers with no stigmata of bleeding       were found in the gastric antrum. Biopsies were taken with a cold       forceps for histology. Biopsies were taken with a cold forceps for       Helicobacter pylori testing.      Diffuse and patchy mild inflammation characterized by erythema and       granularity was found in the gastric antrum.      Patchy mild inflammation characterized by erythema and granularity was   found in the duodenal bulb and in the second portion of the duodenum.  Biopsies were taken with a cold forceps for histology.      After exam of colon I looked back into esophagus and stomach and a ring       was seen at [facility] and I passed a guidewire and removed the scope and       passed a 11F Savary dilator due to her symptom of dysphagia. Ir went       down without significant resistance. Impression:           - Normal esophagus.                       - Non-bleeding gastric ulcers with no stigmata of                        bleeding. Biopsied.                       - Gastritis.                       - Duodenitis. Biopsied. Recommendation:       - Await pathology results. Manya Silvas, MD 11/12/2016 3:09:10 PM This report has been signed electronically. Number of Addenda: 0 Note Initiated On: 11/12/2016 2:32 PM      [facility]

## 2016-11-12 NOTE — Anesthesia Post-op Follow-up Note (Cosign Needed)
Anesthesia QCDR form completed.        

## 2016-11-12 NOTE — Op Note (Signed)
[facility] Gastroenterology Patient Name: Sydney Gillespie Procedure Date: 11/12/2016 2:32 PM MRN: [id number] Account #: [id number] Date of Birth: 01-10-1941 Admit Type: Outpatient Age: 76 Room: [facility] ENDO ROOM 1 Gender: Female Note Status: Finalized Procedure:            Colonoscopy Indications:          High risk colon cancer surveillance: Personal history                        of colonic polyps Providers:            Manya Silvas, MD Referring MD:         [name], MD (Referring MD) Medicines:            Propofol per Anesthesia Complications:        No immediate complications. Procedure:            Pre-Anesthesia Assessment:                       - After reviewing the risks and benefits, the patient                        was deemed in satisfactory condition to undergo the                        procedure.                       After obtaining informed consent, the colonoscope was                        passed under direct vision. Throughout the procedure,                        the patient's blood pressure, pulse, and oxygen                        saturations were monitored continuously. The                        Colonoscope was introduced through the anus and                        advanced to the the cecum, identified by appendiceal                        orifice and ileocecal valve. The colonoscopy was                        performed without difficulty. The patient tolerated the                        procedure well. The quality of the bowel preparation                        was adequate to identify polyps. Findings:      Internal hemorrhoids were found during endoscopy. The hemorrhoids were       medium-sized and Grade I (internal hemorrhoids that do not prolapse).      The exam was otherwise without abnormality. Mild pigmentation of colon  mucosa in right colon of no clinical significance. Impression:           - Internal  hemorrhoids.                       - The examination was otherwise normal.                       - No specimens collected. Recommendation:       - The findings and recommendations were discussed with                        the patient's family. Manya Silvas, MD 11/12/2016 3:29:58 PM This report has been signed electronically. Number of Addenda: 0 Note Initiated On: 11/12/2016 2:32 PM Scope Withdrawal Time: 0 hours 4 minutes 36 seconds  Total Procedure Duration: 0 hours 14 minutes 59 seconds       [facility]

## 2016-11-12 NOTE — Anesthesia Preprocedure Evaluation (Signed)
Anesthesia Evaluation  Patient identified by MRN, date of birth, ID band Patient awake    Reviewed: Allergy & Precautions, H&P , NPO status , Patient's Chart, lab work & pertinent test results, reviewed documented beta blocker date and time   Airway Mallampati: II  TM Distance: >3 FB Neck ROM: Full    Dental no notable dental hx. (+) Teeth Intact, Dental Advisory Given   Pulmonary neg pulmonary ROS,    Pulmonary exam normal breath sounds clear to auscultation       Cardiovascular hypertension, Pt. on medications and Pt. on home beta blockers + dysrhythmias  Rhythm:Regular Rate:Normal     Neuro/Psych  Neuromuscular disease negative neurological ROS  negative psych ROS   GI/Hepatic Neg liver ROS, GERD  Medicated and Controlled,  Endo/Other  Hypothyroidism   Renal/GU Renal InsufficiencyRenal disease  negative genitourinary   Musculoskeletal  (+) Arthritis , Rheumatoid disorders,    Abdominal   Peds  Hematology negative hematology ROS (+) anemia ,   Anesthesia Other Findings Past Medical History: No date: Allergic rhinitis No date: Allergy No date: Anemia No date: Anemia No date: Barrett's esophagus No date: Blood dyscrasia     Comment: multiple myloma remission No date: Cancer ([place]) No date: Change in bowel habits 2013: Compression fracture No date: Degenerative disc disease, lumbar No date: Dysrhythmia     Comment: palpitations No date: Esophageal reflux No date: Family history of adverse reaction to anesthes*     Comment: nausea -mom No date: GERD (gastroesophageal reflux disease) No date: Gout No date: 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[place]) No date: Heart palpitations No date: Hyperlipidemia No date: Hypothyroidism 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) No date: Lumbago No date: Lumbar radiculitis No date: Lumbar stenosis with neurogenic claudication No date: Multiple myeloma ([place]) No date: Multiple  myeloma ([place]) No date: Osteopenia No date: Osteoporosis No date: Other dysphagia No date: Palpitations No date: Positive PPD No date: Renal insufficiency No date: Rheumatoid arthritis (HCC) No date: Rheumatoid arthritis (HCC) No date: Seasonal allergies No date: Vertigo  Reproductive/Obstetrics negative OB ROS                             Anesthesia Physical  Anesthesia Plan  ASA: III  Anesthesia Plan: General   Post-op Pain Management:    Induction: Intravenous  PONV Risk Score and Plan:   Airway Management Planned: Oral ETT  Additional Equipment:   Intra-op Plan:   Post-operative Plan: Extubation in OR  Informed Consent: I have reviewed the patients History and Physical, chart, labs and discussed the procedure including the risks, benefits and alternatives for the proposed anesthesia with the patient or authorized representative who has indicated his/her understanding and acceptance.   Dental advisory given  Plan Discussed with: CRNA  Anesthesia Plan Comments:         Anesthesia Quick Evaluation

## 2016-11-14 NOTE — Anesthesia Postprocedure Evaluation (Signed)
Anesthesia Post Note  Patient: Sydney Gillespie  Procedure(s) Performed: Procedure(s) (LRB): ESOPHAGOGASTRODUODENOSCOPY (EGD) WITH PROPOFOL (N/A) COLONOSCOPY WITH PROPOFOL (N/A)  Patient location during evaluation: PACU Anesthesia Type: General Level of consciousness: awake and alert and oriented Pain management: pain level controlled Vital Signs Assessment: post-procedure vital signs reviewed and stable Respiratory status: spontaneous breathing Cardiovascular status: blood pressure returned to baseline Anesthetic complications: no     Last Vitals:  Vitals:   11/12/16 1602 11/12/16 1612  BP: 119/74 (!) 145/66  Pulse: (!) 59 (!) 58  Resp: 16 18  Temp:      Last Pain:  Vitals:   11/12/16 1552  TempSrc:   PainSc: Asleep                 Jaden Abreu

## 2016-11-17 ENCOUNTER — Encounter: Payer: Self-pay | Admitting: Unknown Physician Specialty

## 2016-11-17 LAB — SURGICAL PATHOLOGY

## 2016-12-25 ENCOUNTER — Inpatient Hospital Stay: Payer: Medicare Other | Attending: Internal Medicine

## 2016-12-25 DIAGNOSIS — C9001 Multiple myeloma in remission: Secondary | ICD-10-CM | POA: Insufficient documentation

## 2016-12-25 DIAGNOSIS — I129 Hypertensive chronic kidney disease with stage 1 through stage 4 chronic kidney disease, or unspecified chronic kidney disease: Secondary | ICD-10-CM | POA: Insufficient documentation

## 2016-12-25 DIAGNOSIS — N281 Cyst of kidney, acquired: Secondary | ICD-10-CM | POA: Diagnosis not present

## 2016-12-25 DIAGNOSIS — Z79899 Other long term (current) drug therapy: Secondary | ICD-10-CM | POA: Insufficient documentation

## 2016-12-25 DIAGNOSIS — N183 Chronic kidney disease, stage 3 (moderate): Secondary | ICD-10-CM | POA: Insufficient documentation

## 2016-12-25 DIAGNOSIS — M25551 Pain in right hip: Secondary | ICD-10-CM | POA: Insufficient documentation

## 2016-12-25 DIAGNOSIS — D509 Iron deficiency anemia, unspecified: Secondary | ICD-10-CM | POA: Diagnosis not present

## 2016-12-25 LAB — COMPREHENSIVE METABOLIC PANEL
ALT: 9 U/L — ABNORMAL LOW (ref 14–54)
AST: 21 U/L (ref 15–41)
Albumin: 3.5 g/dL (ref 3.5–5.0)
Alkaline Phosphatase: 66 U/L (ref 38–126)
Anion gap: 11 (ref 5–15)
BUN: 22 mg/dL — ABNORMAL HIGH (ref 6–20)
CO2: 23 mmol/L (ref 22–32)
Calcium: 9.6 mg/dL (ref 8.9–10.3)
Chloride: 102 mmol/L (ref 101–111)
Creatinine, Ser: 1.52 mg/dL — ABNORMAL HIGH (ref 0.44–1.00)
GFR calc Af Amer: 37 mL/min — ABNORMAL LOW (ref >60)
GFR calc non Af Amer: 32 mL/min — ABNORMAL LOW (ref >60)
Glucose, Bld: 100 mg/dL — ABNORMAL HIGH (ref 65–99)
Potassium: 3.3 mmol/L — ABNORMAL LOW (ref 3.5–5.1)
Sodium: 136 mmol/L (ref 135–145)
Total Bilirubin: 0.5 mg/dL (ref 0.3–1.2)
Total Protein: 7 g/dL (ref 6.5–8.1)

## 2016-12-25 LAB — CBC WITH DIFFERENTIAL/PLATELET
Basophils Absolute: 0.1 10*3/uL (ref 0–0.1)
Basophils Relative: 1 %
Eosinophils Absolute: 0.5 10*3/uL (ref 0–0.7)
Eosinophils Relative: 6 %
HCT: 30.4 % — ABNORMAL LOW (ref 35.0–47.0)
Hemoglobin: 10.3 g/dL — ABNORMAL LOW (ref 12.0–16.0)
Lymphocytes Relative: 13 %
Lymphs Abs: 1 10*3/uL (ref 1.0–3.6)
MCH: 31.5 pg (ref 26.0–34.0)
MCHC: 34 g/dL (ref 32.0–36.0)
MCV: 92.7 fL (ref 80.0–100.0)
Monocytes Absolute: 0.5 10*3/uL (ref 0.2–0.9)
Monocytes Relative: 6 %
Neutro Abs: 5.8 10*3/uL (ref 1.4–6.5)
Neutrophils Relative %: 74 %
Platelets: 306 10*3/uL (ref 150–440)
RBC: 3.28 MIL/uL — ABNORMAL LOW (ref 3.80–5.20)
RDW: 13.3 % (ref 11.5–14.5)
WBC: 7.8 10*3/uL (ref 3.6–11.0)

## 2016-12-25 LAB — IRON AND TIBC
Iron: 43 ug/dL (ref 28–170)
Saturation Ratios: 19 % (ref 10.4–31.8)
TIBC: 225 ug/dL — ABNORMAL LOW (ref 250–450)
UIBC: 182 ug/dL

## 2016-12-25 LAB — FERRITIN: Ferritin: 139 ng/mL (ref 11–307)

## 2016-12-28 ENCOUNTER — Other Ambulatory Visit: Payer: Medicare Other

## 2016-12-28 ENCOUNTER — Inpatient Hospital Stay (HOSPITAL_BASED_OUTPATIENT_CLINIC_OR_DEPARTMENT_OTHER): Payer: Medicare Other | Admitting: Internal Medicine

## 2016-12-28 VITALS — BP 133/79 | HR 99 | Temp 96.5°F | Resp 18 | Wt 175.8 lb

## 2016-12-28 DIAGNOSIS — I129 Hypertensive chronic kidney disease with stage 1 through stage 4 chronic kidney disease, or unspecified chronic kidney disease: Secondary | ICD-10-CM | POA: Diagnosis not present

## 2016-12-28 DIAGNOSIS — C9001 Multiple myeloma in remission: Secondary | ICD-10-CM

## 2016-12-28 DIAGNOSIS — Z79899 Other long term (current) drug therapy: Secondary | ICD-10-CM | POA: Diagnosis not present

## 2016-12-28 DIAGNOSIS — M85551 Aneurysmal bone cyst, right thigh: Secondary | ICD-10-CM | POA: Diagnosis not present

## 2016-12-28 DIAGNOSIS — N281 Cyst of kidney, acquired: Secondary | ICD-10-CM | POA: Diagnosis not present

## 2016-12-28 DIAGNOSIS — D509 Iron deficiency anemia, unspecified: Secondary | ICD-10-CM

## 2016-12-28 DIAGNOSIS — N183 Chronic kidney disease, stage 3 unspecified: Secondary | ICD-10-CM | POA: Insufficient documentation

## 2016-12-28 DIAGNOSIS — D631 Anemia in chronic kidney disease: Secondary | ICD-10-CM | POA: Insufficient documentation

## 2016-12-28 LAB — MULTIPLE MYELOMA PANEL, SERUM
Albumin SerPl Elph-Mcnc: 3.4 g/dL (ref 2.9–4.4)
Albumin/Glob SerPl: 1.1 (ref 0.7–1.7)
Alpha 1: 0.3 g/dL (ref 0.0–0.4)
Alpha2 Glob SerPl Elph-Mcnc: 1.2 g/dL — ABNORMAL HIGH (ref 0.4–1.0)
B-Globulin SerPl Elph-Mcnc: 0.9 g/dL (ref 0.7–1.3)
Gamma Glob SerPl Elph-Mcnc: 0.7 g/dL (ref 0.4–1.8)
Globulin, Total: 3.1 g/dL (ref 2.2–3.9)
IgA: 99 mg/dL (ref 64–422)
IgG (Immunoglobin G), Serum: 833 mg/dL (ref 700–1600)
IgM (Immunoglobulin M), Srm: 25 mg/dL — ABNORMAL LOW (ref 26–217)
Total Protein ELP: 6.5 g/dL (ref 6.0–8.5)

## 2016-12-28 LAB — KAPPA/LAMBDA LIGHT CHAINS
Kappa free light chain: 47.9 mg/L — ABNORMAL HIGH (ref 3.3–19.4)
Kappa, lambda light chain ratio: 1.84 — ABNORMAL HIGH (ref 0.26–1.65)
Lambda free light chains: 26.1 mg/L (ref 5.7–26.3)

## 2016-12-28 NOTE — Progress Notes (Signed)
Patient does not offer any problems today.  Had neck surgery in 11/2016 and is doing well.

## 2016-12-28 NOTE — Assessment & Plan Note (Addendum)
Multiple myeloma diagnosed in 2012 status post autologous stem cell transplant  2013 is currently on surveillance- not on maintenance therapy. AUG 2018 M protein absent; kappa lambda light chain ratio slightly abnormal K/L= 1.85 [slightly abnormal].  # Chronic kidney disease-III- creatinine around ~1.5/slighly worse. [Baseline around 1.7.]. Recommend increased fluid intake/ appointment with Dr. Candiss Norse.   # Anemia/secondary to CKD- on by mouth iron. Not significantly improved hemoglobin dropped from 12 to Hgb 10.2. Iron saturation 19%. Recommend IV Venofer weekly 4. Repeat CBC in 6 weeks; if not improved would recommend Aranesp.   # Right hip pain- if not better;let us know in 1 week. Will order skeletals survey.   # Follow up in 4 months/myeloma labs/iron;ferritin- 1 week prior; possible venofer. Repeat CBC in 6 weeks.  Plan IV venofer weekly x4   Cc: Dr.Singh.

## 2016-12-28 NOTE — Progress Notes (Signed)
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 benign     Multiple myeloma in remission ([place])   12/18/2015 Initial Diagnosis    Multiple myeloma in remission [facility])            INTERVAL HISTORY: This is a pleasant 76 year-old female patient with above history of multiple  Myeloma currently on surveillance/no maintenance therapy- Is here for follow-up.  Interim had cervical fusion surgery.   Right hip pain- For the last 1-2 weeks. Not getting any worse.  Denies any aches and pains anywhere else. Appetite is good. Complains of fatigue. No blood in stools or black stools. She has been taking iron one a day.  Patient denies any unusual swelling in the legs or tingling or numbness. Appetite is good. No weight loss. Patient denies any worsening pain anywhere. Denies any nausea vomiting.    REVIEW OF SYSTEMS:   10 point review of system is done is negative for mentioned above and in history of present illness  I have reviewed the past medical history, past surgical history, social history and family history with the patient and they are unchanged from previous note unless stated above.  ALLERGIES:  is allergic to isoniazid and keflex [cephalexin].  MEDICATIONS:  Current Outpatient Prescriptions  Medication Sig Dispense Refill  . allopurinol (ZYLOPRIM) 300 MG tablet Take 300 mg by mouth daily.     Marland Kitchen ALPRAZolam (XANAX) 0.25 MG tablet Take 0.25 mg by mouth at bedtime.   0  . atenolol (TENORMIN) 50 MG tablet Take 50 mg by mouth daily.     Marland Kitchen buPROPion (WELLBUTRIN  XL) 300 MG 24 hr tablet Take 300 mg by mouth daily.    . cetirizine (ZYRTEC) 10 MG tablet Take 10 mg by mouth 2 (two) times daily.     . Cyanocobalamin (B-12) 2500 MCG TABS Take 1 tablet by mouth daily.    . diclofenac sodium (VOLTAREN) 1 % GEL Apply 2 g topically 4 (four) times daily.    Marland Kitchen escitalopram (LEXAPRO) 10 MG tablet Take 10 mg by mouth daily.    . fexofenadine (ALLEGRA) 180 MG tablet Take 180 mg by mouth 2 (two) times daily.    . fluticasone (FLONASE) 50 MCG/ACT nasal spray Place 2 sprays into the nose daily.     . hydroxychloroquine (PLAQUENIL) 200 MG tablet TAKE 2 TABLETS BY MOUTH DAILY    . IRON PO Take 1 tablet by mouth daily.    . lansoprazole (PREVACID) 30 MG capsule Take 30 mg by mouth daily at 12 noon.     . leflunomide (ARAVA) 10 MG tablet Take 10 tablets by mouth daily.  1  . levothyroxine (SYNTHROID, LEVOTHROID) 50 MCG tablet Take 50 mcg by mouth daily before breakfast.    . oxyCODONE-acetaminophen (PERCOCET) 10-325 MG tablet Take 1 tablet by mouth every 4 (four) hours as needed for pain. 50 tablet 0  . Potassium 99 MG TABS Take 99 mg by mouth daily.    Marland Kitchen triamterene-hydrochlorothiazide (MAXZIDE-25) 37.5-25 MG per tablet TAKE 1 TABLET BY MOUTH DAILY AS  NEEDED FOR WATER RETENTION    . ciprofloxacin (CIPRO) 750 MG tablet Take 1 tablet by mouth daily.    . traMADol (ULTRAM) 50 MG tablet Take 50 mg by mouth every 6 (six) hours as needed.     No current facility-administered medications for this visit.     PHYSICAL EXAMINATION: ECOG PERFORMANCE STATUS: 0 - Asymptomatic  BP 133/79   Pulse 99   Temp (!) 96.5 F (35.8 C) (Tympanic)   Resp 18   Wt 175 lb 12.8 oz (79.7 kg)   BMI 27.53 kg/m   Filed Weights   12/28/16 1345  Weight: 175 lb 12.8 oz (79.7 kg)    GENERAL:alert, no distress and comfortable.Accompanied from family. EYES: normal, Conjunctiva are pink and non-injected, sclera clear OROPHARYNX:no exudate, no erythema and lips, buccal mucosa, and tongue normal   NECK: No thyromegaly LYMPH:  no palpable lymphadenopathy in the cervical, axillary or inguinal LUNGS: clear to auscultation with normal breathing effort; No Wheeze or crackles  Cardio-vascular- Regular Rate & Rythm and no murmurs and no lower extremity edema ABDOMEN:abdomen soft, non-tender and normal bowel sounds; No hepato-splenomegaly.  Musculoskeletal:no cyanosis of digits and no clubbing  NEURO: alert & oriented x 3 with fluent speech, no focal motor/sensory deficits. SKIN: ecchymosis bilateral upper extremities [chronic]  LABORATORY DATA:  I have reviewed the data as listed    Component Value Date/Time   NA 136 12/25/2016 1033   NA 142 08/16/2013 1028   K 3.3 (L) 12/25/2016 1033   K 4.5 08/16/2013 1028   CL 102 12/25/2016 1033   CL 106 08/16/2013 1028   CO2 23 12/25/2016 1033   CO2 27 08/16/2013 1028   GLUCOSE 100 (H) 12/25/2016 1033   GLUCOSE 99 08/16/2013 1028   BUN 22 (H) 12/25/2016 1033   BUN 21 (H) 08/16/2013 1028   CREATININE 1.52 (H) 12/25/2016 1033   CREATININE 1.42 (H) 09/07/2014 0954   CALCIUM 9.6 12/25/2016 1033   CALCIUM 9.3 09/07/2014 0954   PROT 7.0 12/25/2016 1033   PROT 7.4 08/16/2013 1028   ALBUMIN 3.5 12/25/2016 1033   ALBUMIN 3.9 08/16/2013 1028   AST 21 12/25/2016 1033   AST 27 08/16/2013 1028   ALT 9 (L) 12/25/2016 1033   ALT 15 08/16/2013 1028   ALKPHOS 66 12/25/2016 1033   ALKPHOS 61 08/16/2013 1028   BILITOT 0.5 12/25/2016 1033   BILITOT 0.3 08/16/2013 1028   GFRNONAA 32 (L) 12/25/2016 1033   GFRNONAA 36 (L) 09/07/2014 0954   GFRAA 37 (L) 12/25/2016 1033   GFRAA 42 (L) 09/07/2014 0954    No results found for: SPEP, UPEP  Lab Results  Component Value Date   WBC 7.8 12/25/2016   NEUTROABS 5.8 12/25/2016   HGB 10.3 (L) 12/25/2016   HCT 30.4 (L) 12/25/2016   MCV 92.7 12/25/2016   PLT 306 12/25/2016      Chemistry      Component Value Date/Time   NA 136 12/25/2016 1033   NA 142 08/16/2013 1028   K 3.3 (L) 12/25/2016 1033   K  4.5 08/16/2013 1028   CL 102 12/25/2016 1033   CL 106 08/16/2013 1028   CO2 23 12/25/2016 1033   CO2 27 08/16/2013 1028   BUN 22 (H) 12/25/2016 1033   BUN 21 (H) 08/16/2013 1028   CREATININE 1.52 (H) 12/25/2016 1033   CREATININE 1.42 (H) 09/07/2014 0954      Component Value Date/Time   CALCIUM 9.6 12/25/2016 1033   CALCIUM 9.3 09/07/2014  0954   ALKPHOS 66 12/25/2016 1033   ALKPHOS 61 08/16/2013 1028   AST 21 12/25/2016 1033   AST 27 08/16/2013 1028   ALT 9 (L) 12/25/2016 1033   ALT 15 08/16/2013 1028   BILITOT 0.5 12/25/2016 1033   BILITOT 0.3 08/16/2013 1028       RADIOGRAPHIC STUDIES: I have personally reviewed the radiological images as listed and agreed with the findings in the report. No results found.   ASSESSMENT & PLAN:    Multiple myeloma in remission [facility]) Multiple myeloma diagnosed in 2012 status post autologous stem cell transplant  2013 is currently on surveillance- not on maintenance therapy. AUG 2018 M protein absent; kappa lambda light chain ratio slightly abnormal K/L= 1.85 [slightly abnormal].  # Chronic kidney disease-III- creatinine around ~1.5/slighly worse. [Baseline around 1.7.]. Recommend increased fluid intake/ appointment with Dr. Candiss Norse.   # Anemia/secondary to CKD- on by mouth iron. Not significantly improved hemoglobin dropped from 12 to Hgb 10.2. Iron saturation 19%. Recommend IV Venofer weekly 4. Repeat CBC in 6 weeks; if not improved would recommend Aranesp.   # Right hip pain- if not better;let us know in 1 week. Will order skeletals survey.   # Follow up in 4 months/myeloma labs/iron;ferritin- 1 week prior; possible venofer. Repeat CBC in 6 weeks.  Plan IV venofer weekly x4   Cc: Dr.Singh.     Cammie Sickle, MD 12/28/2016 2:51 PM

## 2016-12-30 ENCOUNTER — Inpatient Hospital Stay: Payer: Medicare Other

## 2016-12-30 VITALS — BP 128/74 | HR 100 | Temp 98.3°F | Resp 18

## 2016-12-30 DIAGNOSIS — N183 Chronic kidney disease, stage 3 unspecified: Secondary | ICD-10-CM

## 2016-12-30 DIAGNOSIS — C9001 Multiple myeloma in remission: Secondary | ICD-10-CM | POA: Diagnosis not present

## 2016-12-30 DIAGNOSIS — D631 Anemia in chronic kidney disease: Secondary | ICD-10-CM

## 2016-12-30 MED ORDER — IRON SUCROSE 20 MG/ML IV SOLN
200.0000 mg | Freq: Once | INTRAVENOUS | Status: AC
  Administered 2016-12-30: 200 mg via INTRAVENOUS
  Filled 2016-12-30: qty 10

## 2016-12-30 MED ORDER — SODIUM CHLORIDE 0.9 % IV SOLN: 200.0000 mg | Freq: Once | INTRAVENOUS | Status: DC

## 2016-12-30 MED ORDER — SODIUM CHLORIDE 0.9 % IV SOLN
Freq: Once | INTRAVENOUS | Status: AC
  Administered 2016-12-30: 14:00:00 via INTRAVENOUS
  Filled 2016-12-30: qty 1000

## 2017-01-06 ENCOUNTER — Inpatient Hospital Stay: Payer: Medicare Other

## 2017-01-06 VITALS — BP 115/69 | HR 84 | Temp 98.4°F | Resp 18

## 2017-01-06 DIAGNOSIS — N183 Chronic kidney disease, stage 3 unspecified: Secondary | ICD-10-CM

## 2017-01-06 DIAGNOSIS — D631 Anemia in chronic kidney disease: Secondary | ICD-10-CM

## 2017-01-06 DIAGNOSIS — C9001 Multiple myeloma in remission: Secondary | ICD-10-CM | POA: Diagnosis not present

## 2017-01-06 MED ORDER — IRON SUCROSE 20 MG/ML IV SOLN
200.0000 mg | Freq: Once | INTRAVENOUS | Status: AC
  Administered 2017-01-06: 200 mg via INTRAVENOUS
  Filled 2017-01-06: qty 10

## 2017-01-06 MED ORDER — SODIUM CHLORIDE 0.9 % IV SOLN
Freq: Once | INTRAVENOUS | Status: AC
  Administered 2017-01-06: 14:00:00 via INTRAVENOUS
  Filled 2017-01-06: qty 1000

## 2017-01-06 MED ORDER — SODIUM CHLORIDE 0.9 % IV SOLN: 200.0000 mg | Freq: Once | INTRAVENOUS | Status: DC

## 2017-01-13 ENCOUNTER — Inpatient Hospital Stay: Payer: Medicare Other | Attending: Internal Medicine

## 2017-01-13 VITALS — BP 149/79 | HR 77 | Temp 96.8°F | Resp 18

## 2017-01-13 DIAGNOSIS — N183 Chronic kidney disease, stage 3 unspecified: Secondary | ICD-10-CM

## 2017-01-13 DIAGNOSIS — D631 Anemia in chronic kidney disease: Secondary | ICD-10-CM | POA: Diagnosis not present

## 2017-01-13 DIAGNOSIS — I129 Hypertensive chronic kidney disease with stage 1 through stage 4 chronic kidney disease, or unspecified chronic kidney disease: Secondary | ICD-10-CM | POA: Diagnosis present

## 2017-01-13 DIAGNOSIS — Z79899 Other long term (current) drug therapy: Secondary | ICD-10-CM | POA: Diagnosis not present

## 2017-01-13 MED ORDER — SODIUM CHLORIDE 0.9 % IV SOLN: 200.0000 mg | Freq: Once | INTRAVENOUS | Status: DC

## 2017-01-13 MED ORDER — SODIUM CHLORIDE 0.9 % IV SOLN
Freq: Once | INTRAVENOUS | Status: AC
  Administered 2017-01-13: 14:00:00 via INTRAVENOUS
  Filled 2017-01-13: qty 1000

## 2017-01-13 MED ORDER — IRON SUCROSE 20 MG/ML IV SOLN
200.0000 mg | Freq: Once | INTRAVENOUS | Status: AC
  Administered 2017-01-13: 200 mg via INTRAVENOUS
  Filled 2017-01-13: qty 10

## 2017-01-20 ENCOUNTER — Inpatient Hospital Stay: Payer: Medicare Other

## 2017-01-20 VITALS — BP 138/73 | HR 80 | Temp 97.9°F | Resp 18

## 2017-01-20 DIAGNOSIS — D631 Anemia in chronic kidney disease: Secondary | ICD-10-CM

## 2017-01-20 DIAGNOSIS — N183 Chronic kidney disease, stage 3 unspecified: Secondary | ICD-10-CM

## 2017-01-20 DIAGNOSIS — I129 Hypertensive chronic kidney disease with stage 1 through stage 4 chronic kidney disease, or unspecified chronic kidney disease: Secondary | ICD-10-CM | POA: Diagnosis not present

## 2017-01-20 MED ORDER — SODIUM CHLORIDE 0.9 % IV SOLN: 200.0000 mg | Freq: Once | INTRAVENOUS | Status: DC

## 2017-01-20 MED ORDER — IRON SUCROSE 20 MG/ML IV SOLN
200.0000 mg | Freq: Once | INTRAVENOUS | Status: AC
  Administered 2017-01-20: 200 mg via INTRAVENOUS
  Filled 2017-01-20: qty 10

## 2017-01-20 MED ORDER — SODIUM CHLORIDE 0.9 % IV SOLN
Freq: Once | INTRAVENOUS | Status: AC
  Administered 2017-01-20: 14:00:00 via INTRAVENOUS
  Filled 2017-01-20: qty 1000

## 2017-02-08 ENCOUNTER — Inpatient Hospital Stay: Payer: Medicare Other | Attending: Internal Medicine

## 2017-02-08 ENCOUNTER — Telehealth: Payer: Self-pay

## 2017-02-08 DIAGNOSIS — I129 Hypertensive chronic kidney disease with stage 1 through stage 4 chronic kidney disease, or unspecified chronic kidney disease: Secondary | ICD-10-CM | POA: Insufficient documentation

## 2017-02-08 DIAGNOSIS — N183 Chronic kidney disease, stage 3 (moderate): Secondary | ICD-10-CM | POA: Diagnosis not present

## 2017-02-08 DIAGNOSIS — C9001 Multiple myeloma in remission: Secondary | ICD-10-CM

## 2017-02-08 DIAGNOSIS — D631 Anemia in chronic kidney disease: Secondary | ICD-10-CM | POA: Insufficient documentation

## 2017-02-08 LAB — CBC WITH DIFFERENTIAL/PLATELET
Basophils Absolute: 0.1 10*3/uL (ref 0–0.1)
Basophils Relative: 1 %
Eosinophils Absolute: 0.2 10*3/uL (ref 0–0.7)
Eosinophils Relative: 3 %
HCT: 35.3 % (ref 35.0–47.0)
Hemoglobin: 12.1 g/dL (ref 12.0–16.0)
Lymphocytes Relative: 17 %
Lymphs Abs: 1.2 10*3/uL (ref 1.0–3.6)
MCH: 31.8 pg (ref 26.0–34.0)
MCHC: 34.2 g/dL (ref 32.0–36.0)
MCV: 93 fL (ref 80.0–100.0)
Monocytes Absolute: 0.4 10*3/uL (ref 0.2–0.9)
Monocytes Relative: 7 %
Neutro Abs: 5 10*3/uL (ref 1.4–6.5)
Neutrophils Relative %: 72 %
Platelets: 202 10*3/uL (ref 150–440)
RBC: 3.79 MIL/uL — ABNORMAL LOW (ref 3.80–5.20)
RDW: 14.7 % — ABNORMAL HIGH (ref 11.5–14.5)
WBC: 6.9 10*3/uL (ref 3.6–11.0)

## 2017-02-08 NOTE — Telephone Encounter (Signed)
Call to pt on mobile phone and home phone w messages left on both. Message was hgb has improved to 12.1 after iv iron. No additional new recommendations. F/U as planned . Encouraged her to call Danley Danker RN w questions/ concerns.

## 2017-03-26 ENCOUNTER — Other Ambulatory Visit: Payer: Self-pay | Admitting: Internal Medicine

## 2017-03-26 DIAGNOSIS — Z1231 Encounter for screening mammogram for malignant neoplasm of breast: Secondary | ICD-10-CM

## 2017-04-19 ENCOUNTER — Ambulatory Visit: Payer: BLUE CROSS/BLUE SHIELD

## 2017-04-28 ENCOUNTER — Inpatient Hospital Stay: Payer: Medicare Other | Attending: Internal Medicine

## 2017-04-28 DIAGNOSIS — C9001 Multiple myeloma in remission: Secondary | ICD-10-CM | POA: Diagnosis present

## 2017-04-28 DIAGNOSIS — M549 Dorsalgia, unspecified: Secondary | ICD-10-CM | POA: Diagnosis not present

## 2017-04-28 DIAGNOSIS — Z8719 Personal history of other diseases of the digestive system: Secondary | ICD-10-CM | POA: Insufficient documentation

## 2017-04-28 DIAGNOSIS — Z79899 Other long term (current) drug therapy: Secondary | ICD-10-CM | POA: Diagnosis not present

## 2017-04-28 DIAGNOSIS — G8929 Other chronic pain: Secondary | ICD-10-CM | POA: Diagnosis not present

## 2017-04-28 DIAGNOSIS — M069 Rheumatoid arthritis, unspecified: Secondary | ICD-10-CM | POA: Insufficient documentation

## 2017-04-28 DIAGNOSIS — K219 Gastro-esophageal reflux disease without esophagitis: Secondary | ICD-10-CM | POA: Diagnosis not present

## 2017-04-28 DIAGNOSIS — N189 Chronic kidney disease, unspecified: Secondary | ICD-10-CM | POA: Insufficient documentation

## 2017-04-28 DIAGNOSIS — N281 Cyst of kidney, acquired: Secondary | ICD-10-CM | POA: Insufficient documentation

## 2017-04-28 DIAGNOSIS — I129 Hypertensive chronic kidney disease with stage 1 through stage 4 chronic kidney disease, or unspecified chronic kidney disease: Secondary | ICD-10-CM | POA: Diagnosis not present

## 2017-04-28 LAB — CBC WITH DIFFERENTIAL/PLATELET
Basophils Absolute: 0 10*3/uL (ref 0–0.1)
Basophils Relative: 1 %
Eosinophils Absolute: 0.1 10*3/uL (ref 0–0.7)
Eosinophils Relative: 2 %
HCT: 38.9 % (ref 35.0–47.0)
Hemoglobin: 12.7 g/dL (ref 12.0–16.0)
Lymphocytes Relative: 22 %
Lymphs Abs: 1.3 10*3/uL (ref 1.0–3.6)
MCH: 31.2 pg (ref 26.0–34.0)
MCHC: 32.7 g/dL (ref 32.0–36.0)
MCV: 95.4 fL (ref 80.0–100.0)
Monocytes Absolute: 0.4 10*3/uL (ref 0.2–0.9)
Monocytes Relative: 7 %
Neutro Abs: 4.1 10*3/uL (ref 1.4–6.5)
Neutrophils Relative %: 68 %
Platelets: 193 10*3/uL (ref 150–440)
RBC: 4.07 MIL/uL (ref 3.80–5.20)
RDW: 14 % (ref 11.5–14.5)
WBC: 6 10*3/uL (ref 3.6–11.0)

## 2017-04-28 LAB — BASIC METABOLIC PANEL
Anion gap: 9 (ref 5–15)
BUN: 37 mg/dL — ABNORMAL HIGH (ref 6–20)
CO2: 20 mmol/L — ABNORMAL LOW (ref 22–32)
Calcium: 9.1 mg/dL (ref 8.9–10.3)
Chloride: 105 mmol/L (ref 101–111)
Creatinine, Ser: 1.85 mg/dL — ABNORMAL HIGH (ref 0.44–1.00)
GFR calc Af Amer: 29 mL/min — ABNORMAL LOW (ref >60)
GFR calc non Af Amer: 25 mL/min — ABNORMAL LOW (ref >60)
Glucose, Bld: 93 mg/dL (ref 65–99)
Potassium: 3.9 mmol/L (ref 3.5–5.1)
Sodium: 134 mmol/L — ABNORMAL LOW (ref 135–145)

## 2017-04-28 LAB — IRON AND TIBC
Iron: 73 ug/dL (ref 28–170)
Saturation Ratios: 27 % (ref 10.4–31.8)
TIBC: 270 ug/dL (ref 250–450)
UIBC: 197 ug/dL

## 2017-04-28 LAB — FERRITIN: Ferritin: 291 ng/mL (ref 11–307)

## 2017-04-29 ENCOUNTER — Ambulatory Visit
Admission: RE | Admit: 2017-04-29 | Discharge: 2017-04-29 | Disposition: A | Discharge status: Home / Self Care | Payer: Medicare Other | Source: Ambulatory Visit | Attending: Internal Medicine | Admitting: Internal Medicine

## 2017-04-29 DIAGNOSIS — Z1231 Encounter for screening mammogram for malignant neoplasm of breast: Secondary | ICD-10-CM | POA: Insufficient documentation

## 2017-04-29 LAB — KAPPA/LAMBDA LIGHT CHAINS
Kappa free light chain: 36 mg/L — ABNORMAL HIGH (ref 3.3–19.4)
Kappa, lambda light chain ratio: 2.29 — ABNORMAL HIGH (ref 0.26–1.65)
Lambda free light chains: 15.7 mg/L (ref 5.7–26.3)

## 2017-04-30 LAB — MULTIPLE MYELOMA PANEL, SERUM
Albumin SerPl Elph-Mcnc: 3.9 g/dL (ref 2.9–4.4)
Albumin/Glob SerPl: 1.2 (ref 0.7–1.7)
Alpha 1: 0.2 g/dL (ref 0.0–0.4)
Alpha2 Glob SerPl Elph-Mcnc: 1.1 g/dL — ABNORMAL HIGH (ref 0.4–1.0)
B-Globulin SerPl Elph-Mcnc: 1 g/dL (ref 0.7–1.3)
Gamma Glob SerPl Elph-Mcnc: 1 g/dL (ref 0.4–1.8)
Globulin, Total: 3.3 g/dL (ref 2.2–3.9)
IgA: 63 mg/dL — ABNORMAL LOW (ref 64–422)
IgG (Immunoglobin G), Serum: 941 mg/dL (ref 700–1600)
IgM (Immunoglobulin M), Srm: 23 mg/dL — ABNORMAL LOW (ref 26–217)
Total Protein ELP: 7.2 g/dL (ref 6.0–8.5)

## 2017-05-07 ENCOUNTER — Encounter: Payer: Self-pay | Admitting: Nurse Practitioner

## 2017-05-07 ENCOUNTER — Inpatient Hospital Stay (HOSPITAL_BASED_OUTPATIENT_CLINIC_OR_DEPARTMENT_OTHER): Payer: Medicare Other | Admitting: Nurse Practitioner

## 2017-05-07 ENCOUNTER — Other Ambulatory Visit: Payer: Self-pay

## 2017-05-07 VITALS — BP 116/71 | HR 85 | Temp 98.1°F | Resp 20 | Ht 67.0 in | Wt 176.6 lb

## 2017-05-07 DIAGNOSIS — M549 Dorsalgia, unspecified: Secondary | ICD-10-CM | POA: Diagnosis not present

## 2017-05-07 DIAGNOSIS — Z8719 Personal history of other diseases of the digestive system: Secondary | ICD-10-CM

## 2017-05-07 DIAGNOSIS — N281 Cyst of kidney, acquired: Secondary | ICD-10-CM

## 2017-05-07 DIAGNOSIS — M069 Rheumatoid arthritis, unspecified: Secondary | ICD-10-CM

## 2017-05-07 DIAGNOSIS — C9001 Multiple myeloma in remission: Secondary | ICD-10-CM | POA: Diagnosis not present

## 2017-05-07 DIAGNOSIS — N189 Chronic kidney disease, unspecified: Secondary | ICD-10-CM

## 2017-05-07 DIAGNOSIS — K219 Gastro-esophageal reflux disease without esophagitis: Secondary | ICD-10-CM | POA: Diagnosis not present

## 2017-05-07 DIAGNOSIS — D631 Anemia in chronic kidney disease: Secondary | ICD-10-CM

## 2017-05-07 DIAGNOSIS — Z79899 Other long term (current) drug therapy: Secondary | ICD-10-CM

## 2017-05-07 DIAGNOSIS — I129 Hypertensive chronic kidney disease with stage 1 through stage 4 chronic kidney disease, or unspecified chronic kidney disease: Secondary | ICD-10-CM

## 2017-05-07 DIAGNOSIS — G8929 Other chronic pain: Secondary | ICD-10-CM | POA: Diagnosis not present

## 2017-05-07 DIAGNOSIS — N183 Chronic kidney disease, stage 3 unspecified: Secondary | ICD-10-CM

## 2017-05-07 MED ORDER — PANTOPRAZOLE SODIUM 40 MG PO TBEC: 40.0000 mg | DELAYED_RELEASE_TABLET | Freq: Every day | ORAL | 3 refills | Status: DC

## 2017-05-07 NOTE — Progress Notes (Signed)
Patient here for follow-up on multiple myeloma. Patient reports generalized fatigue. Patient admits to not taking her iron tablets on a consistent basis.  She has not had any adjustments to her synthroid since September (by pcp).

## 2017-05-07 NOTE — Assessment & Plan Note (Addendum)
#  Multiple myeloma-currently in remission. diagnosed 2012 s/p autologous stem cell transplant in 2013.  Currently on surveillance.  No maintenance therapy.  Clinically no evidence of recurrence.  December 2018-M protein absent; Lambda light chain ratio slightly elevated 2.29.   # CKD stage III- creatinine 04/28/17- 1.8, 05/06/17-1.6 at PCP.  Continue to encourage fluid intake.  Followed and managed by Dr. Ginette Pitman.  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 Tolerated well. Fatigue improved. 04/28/17 hemoglobin 12.7.  Iron saturation 27%, ferritin 291, TIBC 270.  Repeat CBC in 6 weeks.   # GERD- pt complains of symptoms unrelieved by Prevacid. Restricts diet d/t symptoms. Encouraged hydration and lifestyle modifications. Endoscopy unremarkable. Previous hx of Barretts resolved. Will switch from Prevacid to Protonix '40mg'$  daily. Can use zantac for acute symptoms. Appreciate further management by pcp.   # In 4 months CBC, CMP, myeloma panel, and iron studies.  One week later MD with possible Venofer.   Cc: Dr. Ginette Pitman

## 2017-05-07 NOTE — Progress Notes (Signed)
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 benign     Multiple myeloma in remission ([place])   12/18/2015 Initial Diagnosis    Multiple myeloma in remission [facility])         INTERVAL HISTORY: Very pleasant 76 year old female patient with above history of multiple myeloma, currently on surveillance (no maintenance therapy), who returns to clinic for follow-up.  Patient reports feeling well. Fatigue improved significantly after Venofer x4. Takes oral iron 'sometimes' but reports GI upset. No blood or black stools. Appetite is good.  Complains of continued acid reflux symptoms unrelieved by Prevacid. Endoscopy 11/12/16 revealed normal esophagus, chronic gastritis and duodenitis, with non-bleeding gastric ulcers. Negative for dysplasia, malignancy, or infection. Negative for h. Pylori.  She denies unusual swelling of legs or tingling or numbness. No weight loss. Denies pain. Denies nausea or vomiting. No new lumps or bumps. Denies fever, chills, or night sweats.    REVIEW OF SYSTEMS:   10 point review of system is done is negative for mentioned above and in history of present illness  I have reviewed the past medical history, past surgical history, social history and family history with the patient and they are unchanged from previous note unless stated above.  ALLERGIES:  is allergic to isoniazid and keflex [cephalexin].  MEDICATIONS:  Current Outpatient Medications  Medication Sig Dispense Refill  . allopurinol (ZYLOPRIM) 300 MG tablet Take 300 mg by mouth daily.     Marland Kitchen  ALPRAZolam (XANAX) 0.25 MG tablet Take 0.25 mg by mouth at bedtime.   0  . atenolol (TENORMIN) 50 MG tablet Take 50 mg by mouth daily.     . cetirizine (ZYRTEC) 10 MG tablet Take 10 mg by mouth 2 (two) times daily.     . Cyanocobalamin (B-12) 2500 MCG TABS Take 1 tablet by mouth daily.    Marland Kitchen escitalopram (LEXAPRO) 10 MG tablet Take 10 mg by mouth daily.    . fexofenadine (ALLEGRA) 180 MG tablet Take 180 mg by mouth 2 (two) times daily.    . fluticasone (FLONASE) 50 MCG/ACT nasal spray Place 2 sprays into the nose daily.     . hydroxychloroquine (PLAQUENIL) 200 MG tablet TAKE 2 TABLETS BY MOUTH DAILY    . lansoprazole (PREVACID) 30 MG capsule Take 30 mg by mouth daily at 12 noon.     . leflunomide (ARAVA) 10 MG tablet Take 10 tablets by mouth daily.  1  . levothyroxine (SYNTHROID, LEVOTHROID) 50 MCG tablet Take 50 mcg by mouth daily before breakfast.    . Potassium 99 MG TABS Take 99 mg by mouth daily.    Marland Kitchen triamterene-hydrochlorothiazide (MAXZIDE-25) 37.5-25 MG per tablet TAKE 1 TABLET BY MOUTH DAILY AS NEEDED FOR WATER RETENTION    . diclofenac sodium (VOLTAREN) 1 % GEL Apply 2 g topically 4 (four) times daily.    . IRON PO Take 1 tablet by mouth daily.    Marland Kitchen oxyCODONE-acetaminophen (PERCOCET) 10-325 MG tablet Take 1 tablet by mouth every 4 (four) hours as needed for pain. (Patient not taking: Reported on  05/07/2017) 50 tablet 0  . pantoprazole (PROTONIX) 40 MG tablet Take 1 tablet (40 mg total) by mouth daily. 90 tablet 3  . traMADol (ULTRAM) 50 MG tablet Take 50 mg by mouth every 6 (six) hours as needed for moderate pain.      No current facility-administered medications for this visit.     PHYSICAL EXAMINATION: ECOG PERFORMANCE STATUS: 0 - Asymptomatic  BP 116/71 (Patient Position: Sitting)   Pulse 85   Temp 98.1 F (36.7 C) (Tympanic)   Resp 20   Ht '5\' 7"'  (1.702 m)   Wt 176 lb 9.6 oz (80.1 kg)   BMI 27.66 kg/m   Filed Weights   05/07/17 1507  Weight: 176 lb 9.6 oz (80.1 kg)     GENERAL: alert, no distress. appears comfortable. Accompanied by husband EYES: normal, sclera clear. Conjunctiva are pink and not injected.  OROPHARYNX: no exudate, no erythema and lips, buccal mucosa and tongue normal  NECK: No thyromegaly LYMPH:  no palpable lymphadenopathy in the cervical, axillary or inguinal areas LUNGS: clear to auscultation with normal breathing effort; No Wheeze or crackles  Cardio-vascular- Regular Rate & Rythm without murmurs and no lower extremity edema ABDOMEN: abdomen soft, non-tender and normal bowel sounds; No hepato-splenomegaly.  Musculoskeletal: no cyanosis of digits and no clubbing  NEURO: alert & oriented x 3 with fluent speech, no focal motor/sensory deficits. SKIN: no new rashes or lesions  LABORATORY DATA:  I have reviewed the data as listed    Component Value Date/Time   NA 134 (L) 04/28/2017 1115   NA 142 08/16/2013 1028   K 3.9 04/28/2017 1115   K 4.5 08/16/2013 1028   CL 105 04/28/2017 1115   CL 106 08/16/2013 1028   CO2 20 (L) 04/28/2017 1115   CO2 27 08/16/2013 1028   GLUCOSE 93 04/28/2017 1115   GLUCOSE 99 08/16/2013 1028   BUN 37 (H) 04/28/2017 1115   BUN 21 (H) 08/16/2013 1028   CREATININE 1.85 (H) 04/28/2017 1115   CREATININE 1.42 (H) 09/07/2014 0954   CALCIUM 9.1 04/28/2017 1115   CALCIUM 9.3 09/07/2014 0954   PROT 7.0 12/25/2016 1033   PROT 7.4 08/16/2013 1028   ALBUMIN 3.5 12/25/2016 1033   ALBUMIN 3.9 08/16/2013 1028   AST 21 12/25/2016 1033   AST 27 08/16/2013 1028   ALT 9 (L) 12/25/2016 1033   ALT 15 08/16/2013 1028   ALKPHOS 66 12/25/2016 1033   ALKPHOS 61 08/16/2013 1028   BILITOT 0.5 12/25/2016 1033   BILITOT 0.3 08/16/2013 1028   GFRNONAA 25 (L) 04/28/2017 1115   GFRNONAA 36 (L) 09/07/2014 0954   GFRAA 29 (L) 04/28/2017 1115   GFRAA 42 (L) 09/07/2014 0954    No results found for: SPEP, UPEP  Lab Results  Component Value Date   WBC 6.0 04/28/2017   NEUTROABS 4.1 04/28/2017   HGB 12.7 04/28/2017    HCT 38.9 04/28/2017   MCV 95.4 04/28/2017   PLT 193 04/28/2017      Chemistry      Component Value Date/Time   NA 134 (L) 04/28/2017 1115   NA 142 08/16/2013 1028   K 3.9 04/28/2017 1115   K 4.5 08/16/2013 1028   CL 105 04/28/2017 1115   CL 106 08/16/2013 1028   CO2 20 (L) 04/28/2017 1115   CO2 27 08/16/2013 1028   BUN 37 (H) 04/28/2017 1115   BUN 21 (H) 08/16/2013 1028   CREATININE 1.85 (H) 04/28/2017 1115   CREATININE 1.42 (H)  09/07/2014 0954      Component Value Date/Time   CALCIUM 9.1 04/28/2017 1115   CALCIUM 9.3 09/07/2014 0954   ALKPHOS 66 12/25/2016 1033   ALKPHOS 61 08/16/2013 1028   AST 21 12/25/2016 1033   AST 27 08/16/2013 1028   ALT 9 (L) 12/25/2016 1033   ALT 15 08/16/2013 1028   BILITOT 0.5 12/25/2016 1033   BILITOT 0.3 08/16/2013 1028       RADIOGRAPHIC STUDIES: I have personally reviewed the radiological images as listed and agreed with the findings in the report. No results found.   ASSESSMENT & PLAN:     No problem-specific Assessment & Plan notes found for this encounter.     Sydney Au, NP 05/07/2017 3:45 PM

## 2017-06-29 ENCOUNTER — Other Ambulatory Visit: Payer: Self-pay

## 2017-07-12 ENCOUNTER — Ambulatory Visit (INDEPENDENT_AMBULATORY_CARE_PROVIDER_SITE_OTHER): Payer: Medicare Other | Admitting: Urology

## 2017-07-12 ENCOUNTER — Encounter: Payer: Self-pay | Admitting: Urology

## 2017-07-12 ENCOUNTER — Ambulatory Visit: Payer: Self-pay | Admitting: Urology

## 2017-07-12 VITALS — BP 144/71 | HR 82 | Ht 67.0 in | Wt 180.2 lb

## 2017-07-12 DIAGNOSIS — R32 Unspecified urinary incontinence: Secondary | ICD-10-CM | POA: Diagnosis not present

## 2017-07-12 DIAGNOSIS — N3946 Mixed incontinence: Secondary | ICD-10-CM

## 2017-07-12 LAB — URINALYSIS, COMPLETE
Bilirubin, UA: NEGATIVE
Glucose, UA: NEGATIVE
Ketones, UA: NEGATIVE
Leukocytes, UA: NEGATIVE
Nitrite, UA: NEGATIVE
Protein, UA: NEGATIVE
RBC, UA: NEGATIVE
Specific Gravity, UA: 1.015 (ref 1.005–1.030)
Urobilinogen, Ur: 0.2 mg/dL (ref 0.2–1.0)
pH, UA: 6 (ref 5.0–7.5)

## 2017-07-12 NOTE — Progress Notes (Signed)
07/12/2017 2:46 PM   Sydney Gillespie Sep 24, 1940 [id number]  Referring provider: [name], MD [address] [facility] [place], [place] [address]  Chief Complaint  Patient presents with  . Urinary Incontinence    HPI: Was consulted to assess the patient's urinary incontinence worsening in the last month.  It is been present for years.  She leaks with coughing sneezing bending and lifting.  She has urge incontinence.  Both are significant.  She says her main complaint is when she goes from a sitting to standing position the urine just comes out.  She wears 3 or 4 pads a day that can be soaked.  She has moderately severe bedwetting.  Her flow is lessened some recently.  She can have urgency and then has a little bit of trouble urinating.  She voids 2 or 3 times a night and every 90 minutes during the day  She has had a hysterectomy.  She has had neck and low back surgery  She denies a history of kidney stones previous GU surgery and urinary tract infections.  Bowel movements normal.  Presentation has not been medically treated  Modifying factors: There are no other modifying factors  Associated signs and symptoms: There are no other associated signs and symptoms Aggravating and relieving factors: There are no other aggravating or relieving factors Severity: Moderate Duration: Persistent   PMH: Past Medical History:  Diagnosis Date  . Allergic rhinitis   . Allergy   . Anemia   . Anemia   . Barrett's esophagus   . Blood dyscrasia    multiple myloma remission  . Cancer ([place])   . Change in bowel habits   . Compression fracture 2013  . Degenerative disc disease, lumbar   . Dysrhythmia    palpitations  . Esophageal reflux   . Family history of adverse reaction to anesthesia    nausea -mom  . GERD (gastroesophageal reflux disease)   . Gout   . 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[place])   . Heart palpitations   . Hyperlipidemia   . Hypothyroidism   .  Inflammatory polyarthropathy ([place])   . Lumbago   . Lumbar radiculitis   . Lumbar stenosis with neurogenic claudication   . Multiple myeloma ([place])   . Multiple myeloma ([place])   . Osteopenia   . Osteoporosis   . Other dysphagia   . Palpitations   . Positive PPD   . Renal insufficiency   . Rheumatoid arthritis ([place])   . Rheumatoid arthritis ([place])   . Seasonal allergies   . Vertigo     Surgical History: Past Surgical History:  Procedure Laterality Date  . ABDOMINAL HYSTERECTOMY    . APPENDECTOMY    . BACK SURGERY    . BREAST EXCISIONAL BIOPSY Bilateral    neg  . CHOLECYSTECTOMY    . COLONOSCOPY WITH PROPOFOL N/A 11/12/2016   Procedure: COLONOSCOPY WITH PROPOFOL;  Surgeon: Manya Silvas, MD;  Location: [facility] ENDOSCOPY;  Service: Endoscopy;  Laterality: N/A;  . ESOPHAGOGASTRODUODENOSCOPY (EGD) WITH PROPOFOL N/A 11/12/2016   Procedure: ESOPHAGOGASTRODUODENOSCOPY (EGD) WITH PROPOFOL;  Surgeon: Manya Silvas, MD;  Location: [facility] ENDOSCOPY;  Service: Endoscopy;  Laterality: N/A;  . FOOT SURGERY    . LUMBAR LAMINECTOMY/DECOMPRESSION MICRODISCECTOMY Left 06/18/2016   Procedure: LAMINECTOMY FOR FACET/SYNOVIAL CYST LUMBAR THREE - LUMBAR FOUR LEFT;  Surgeon: Newman Pies, MD;  Location: [facility];  Service: Neurosurgery;  Laterality: Left;  LAMINECTOMY FOR FACET/SYNOVIAL CYST LUMBAR THREE - LUMBAR FOUR LEFT  Home Medications:  Allergies as of 07/12/2017      Reactions   Isoniazid Other (See Comments)   Blisters    Keflex [cephalexin] Other (See Comments)   Blisters      Medication List        Accurate as of 07/12/17  2:46 PM. Always use your most recent med list.          allopurinol 300 MG tablet Commonly known as:  ZYLOPRIM Take 300 mg by mouth daily.   ALPRAZolam 0.25 MG tablet Commonly known as:  XANAX Take 0.25 mg by mouth at bedtime.   atenolol 50 MG tablet Commonly known as:  TENORMIN Take 50 mg by mouth daily.   B-12 2500 MCG Tabs Take 1 tablet by mouth  daily.   cetirizine 10 MG tablet Commonly known as:  ZYRTEC Take 10 mg by mouth 2 (two) times daily.   diclofenac sodium 1 % Gel Commonly known as:  VOLTAREN Apply 2 g topically 4 (four) times daily.   escitalopram 10 MG tablet Commonly known as:  LEXAPRO Take 10 mg by mouth daily.   fexofenadine 180 MG tablet Commonly known as:  ALLEGRA Take 180 mg by mouth 2 (two) times daily.   fluticasone 50 MCG/ACT nasal spray Commonly known as:  FLONASE Place 2 sprays into the nose daily.   hydroxychloroquine 200 MG tablet Commonly known as:  PLAQUENIL TAKE 2 TABLETS BY MOUTH DAILY   levothyroxine 50 MCG tablet Commonly known as:  SYNTHROID, LEVOTHROID Take 50 mcg by mouth daily before breakfast.   pantoprazole 40 MG tablet Commonly known as:  PROTONIX Take 1 tablet (40 mg total) by mouth daily.   sodium bicarbonate 650 MG tablet TK 1 T PO BID   spironolactone 25 MG tablet Commonly known as:  ALDACTONE   traMADol 50 MG tablet Commonly known as:  ULTRAM Take 50 mg by mouth every 6 (six) hours as needed for moderate pain.   triamterene-hydrochlorothiazide 37.5-25 MG tablet Commonly known as:  MAXZIDE-25 TAKE 1 TABLET BY MOUTH DAILY AS NEEDED FOR WATER RETENTION       Allergies:  Allergies  Allergen Reactions  . Isoniazid Other (See Comments)    Blisters   . Keflex [Cephalexin] Other (See Comments)    Blisters     Family History: Family History  Problem Relation Age of Onset  . Hypertension Mother   . Heart attack Mother   . Rheum arthritis Mother   . Osteoarthritis Mother   . Cancer Father   . Hypertension Father   . Stroke Father   . Breast cancer Maternal Grandmother 59  . Osteoarthritis Other   . Rheum arthritis Other     Social History:  reports that  has never smoked. she has never used smokeless tobacco. She reports that she does not drink alcohol or use drugs.  ROS: UROLOGY Frequent Urination?: Yes Hard to postpone urination?:  Yes Burning/pain with urination?: No Get up at night to urinate?: Yes Leakage of urine?: Yes Urine stream starts and stops?: No Trouble starting stream?: No Do you have to strain to urinate?: No Blood in urine?: No Urinary tract infection?: No Sexually transmitted disease?: No Injury to kidneys or bladder?: No Painful intercourse?: No Weak stream?: No Currently pregnant?: No Vaginal bleeding?: No Last menstrual period?: n  Gastrointestinal Nausea?: Yes Vomiting?: Yes Indigestion/heartburn?: Yes Diarrhea?: No Constipation?: No  Constitutional Fever: No Night sweats?: No Weight loss?: No Fatigue?: No  Skin Skin rash/lesions?: No Itching?: No  Eyes  Blurred vision?: No Double vision?: No  Ears/Nose/Throat Sore throat?: No Sinus problems?: Yes  Hematologic/Lymphatic Swollen glands?: No Easy bruising?: Yes  Cardiovascular Leg swelling?: No Chest pain?: No  Respiratory Cough?: No Shortness of breath?: Yes  Endocrine Excessive thirst?: No  Musculoskeletal Back pain?: Yes Joint pain?: Yes  Neurological Headaches?: No Dizziness?: Yes  Psychologic Depression?: No Anxiety?: No  Physical Exam: BP (!) 144/71 (BP Location: Right Arm, Patient Position: Sitting, Cuff Size: Normal)   Pulse 82   Ht '5\' 7"'  (1.702 m)   Wt 81.7 kg (180 lb 3.2 oz)   BMI 28.22 kg/m   Constitutional:  Alert and oriented, No acute distress. HEENT:  AT, moist mucus membranes.  Trachea midline, no masses. Cardiovascular: No clubbing, cyanosis, or edema. Respiratory: Normal respiratory effort, no increased work of breathing. GI: Abdomen is soft, nontender, nondistended, no abdominal masses GU: Mild hypermobility of the bladder neck and a mild positive cough test with no prolapse Skin: No rashes, bruises or suspicious lesions. Lymph: No cervical or inguinal adenopathy. Neurologic: Grossly intact, no focal deficits, moving all 4 extremities. Psychiatric: Normal mood and  affect.  Laboratory Data: Lab Results  Component Value Date   WBC 6.0 04/28/2017   HGB 12.7 04/28/2017   HCT 38.9 04/28/2017   MCV 95.4 04/28/2017   PLT 193 04/28/2017    Lab Results  Component Value Date   CREATININE 1.85 (H) 04/28/2017    No results found for: PSA  No results found for: TESTOSTERONE  No results found for: HGBA1C  Urinalysis No results found for: COLORURINE, APPEARANCEUR, LABSPEC, PHURINE, GLUCOSEU, HGBUR, BILIRUBINUR, KETONESUR, PROTEINUR, UROBILINOGEN, NITRITE, LEUKOCYTESUR  Pertinent Imaging:   Assessment & Plan: The patient has mixed incontinence.  I think both likely are significant.  I wonder if the overactive bladder component is the most significant.  She does have milder to moderate frequency and nocturia.  She also has moderately severe bedwetting.  She likely is triggering overactivity when she goes from a sitting to standing position but she does have reasonably significant stress incontinence.  If the patient ever did have her stress incontinence treated urethral injectables or sling would be reasonable  The role of urodynamics discussed  1. Urinary incontinence, unspecified type 2.  Mixed urinary incontinence 3.  Urinary frequency   - Urinalysis, Complete - Bladder Scan (Post Void Residual) in office   No Follow-up on file.  Reece Packer, MD  [facility] [address] [place],  [address] [phone]

## 2017-08-02 ENCOUNTER — Ambulatory Visit: Payer: Medicare Other

## 2017-08-20 ENCOUNTER — Other Ambulatory Visit: Payer: Self-pay | Admitting: Urology

## 2017-08-30 ENCOUNTER — Inpatient Hospital Stay: Payer: Medicare Other | Attending: Internal Medicine

## 2017-08-30 ENCOUNTER — Other Ambulatory Visit: Payer: Self-pay

## 2017-08-30 DIAGNOSIS — C9001 Multiple myeloma in remission: Secondary | ICD-10-CM | POA: Diagnosis present

## 2017-08-30 DIAGNOSIS — Z79899 Other long term (current) drug therapy: Secondary | ICD-10-CM | POA: Diagnosis not present

## 2017-08-30 DIAGNOSIS — N183 Chronic kidney disease, stage 3 unspecified: Secondary | ICD-10-CM

## 2017-08-30 DIAGNOSIS — D631 Anemia in chronic kidney disease: Secondary | ICD-10-CM | POA: Insufficient documentation

## 2017-08-30 LAB — CBC WITH DIFFERENTIAL/PLATELET
Basophils Absolute: 0.1 10*3/uL (ref 0–0.1)
Basophils Relative: 1 %
Eosinophils Absolute: 0.3 10*3/uL (ref 0–0.7)
Eosinophils Relative: 5 %
HCT: 35 % (ref 35.0–47.0)
Hemoglobin: 12 g/dL (ref 12.0–16.0)
Lymphocytes Relative: 20 %
Lymphs Abs: 1.2 10*3/uL (ref 1.0–3.6)
MCH: 32.6 pg (ref 26.0–34.0)
MCHC: 34.2 g/dL (ref 32.0–36.0)
MCV: 95.5 fL (ref 80.0–100.0)
Monocytes Absolute: 0.4 10*3/uL (ref 0.2–0.9)
Monocytes Relative: 7 %
Neutro Abs: 4.1 10*3/uL (ref 1.4–6.5)
Neutrophils Relative %: 67 %
Platelets: 172 10*3/uL (ref 150–440)
RBC: 3.66 MIL/uL — ABNORMAL LOW (ref 3.80–5.20)
RDW: 13.7 % (ref 11.5–14.5)
WBC: 6.1 10*3/uL (ref 3.6–11.0)

## 2017-08-30 LAB — COMPREHENSIVE METABOLIC PANEL
ALT: 13 U/L — ABNORMAL LOW (ref 14–54)
AST: 39 U/L (ref 15–41)
Albumin: 4.2 g/dL (ref 3.5–5.0)
Alkaline Phosphatase: 70 U/L (ref 38–126)
Anion gap: 9 (ref 5–15)
BUN: 34 mg/dL — ABNORMAL HIGH (ref 6–20)
CO2: 19 mmol/L — ABNORMAL LOW (ref 22–32)
Calcium: 9.5 mg/dL (ref 8.9–10.3)
Chloride: 109 mmol/L (ref 101–111)
Creatinine, Ser: 1.97 mg/dL — ABNORMAL HIGH (ref 0.44–1.00)
GFR calc Af Amer: 27 mL/min — ABNORMAL LOW (ref >60)
GFR calc non Af Amer: 23 mL/min — ABNORMAL LOW (ref >60)
Glucose, Bld: 99 mg/dL (ref 65–99)
Potassium: 4.1 mmol/L (ref 3.5–5.1)
Sodium: 137 mmol/L (ref 135–145)
Total Bilirubin: 0.3 mg/dL (ref 0.3–1.2)
Total Protein: 7.2 g/dL (ref 6.5–8.1)

## 2017-08-30 LAB — IRON AND TIBC
Iron: 82 ug/dL (ref 28–170)
Saturation Ratios: 30 % (ref 10.4–31.8)
TIBC: 273 ug/dL (ref 250–450)
UIBC: 191 ug/dL

## 2017-08-30 LAB — FERRITIN: Ferritin: 154 ng/mL (ref 11–307)

## 2017-08-31 LAB — KAPPA/LAMBDA LIGHT CHAINS
Kappa free light chain: 42.1 mg/L — ABNORMAL HIGH (ref 3.3–19.4)
Kappa, lambda light chain ratio: 2.29 — ABNORMAL HIGH (ref 0.26–1.65)
Lambda free light chains: 18.4 mg/L (ref 5.7–26.3)

## 2017-09-01 LAB — MULTIPLE MYELOMA PANEL, SERUM
Albumin SerPl Elph-Mcnc: 3.8 g/dL (ref 2.9–4.4)
Albumin/Glob SerPl: 1.4 (ref 0.7–1.7)
Alpha 1: 0.2 g/dL (ref 0.0–0.4)
Alpha2 Glob SerPl Elph-Mcnc: 0.9 g/dL (ref 0.4–1.0)
B-Globulin SerPl Elph-Mcnc: 0.9 g/dL (ref 0.7–1.3)
Gamma Glob SerPl Elph-Mcnc: 0.8 g/dL (ref 0.4–1.8)
Globulin, Total: 2.8 g/dL (ref 2.2–3.9)
IgA: 62 mg/dL — ABNORMAL LOW (ref 64–422)
IgG (Immunoglobin G), Serum: 905 mg/dL (ref 700–1600)
IgM (Immunoglobulin M), Srm: 23 mg/dL — ABNORMAL LOW (ref 26–217)
Total Protein ELP: 6.6 g/dL (ref 6.0–8.5)

## 2017-09-06 ENCOUNTER — Inpatient Hospital Stay: Payer: Medicare Other

## 2017-09-06 ENCOUNTER — Encounter: Payer: Self-pay | Admitting: Internal Medicine

## 2017-09-06 ENCOUNTER — Other Ambulatory Visit: Payer: Self-pay

## 2017-09-06 ENCOUNTER — Inpatient Hospital Stay (HOSPITAL_BASED_OUTPATIENT_CLINIC_OR_DEPARTMENT_OTHER): Payer: Medicare Other | Admitting: Internal Medicine

## 2017-09-06 ENCOUNTER — Other Ambulatory Visit: Payer: BLUE CROSS/BLUE SHIELD

## 2017-09-06 ENCOUNTER — Ambulatory Visit (INDEPENDENT_AMBULATORY_CARE_PROVIDER_SITE_OTHER): Payer: Medicare Other | Admitting: Urology

## 2017-09-06 ENCOUNTER — Encounter: Payer: Self-pay | Admitting: Urology

## 2017-09-06 VITALS — BP 127/76 | HR 76 | Ht 67.0 in | Wt 180.5 lb

## 2017-09-06 VITALS — BP 136/76 | HR 75 | Temp 97.2°F | Resp 18 | Wt 182.0 lb

## 2017-09-06 DIAGNOSIS — D631 Anemia in chronic kidney disease: Secondary | ICD-10-CM | POA: Diagnosis not present

## 2017-09-06 DIAGNOSIS — Z79899 Other long term (current) drug therapy: Secondary | ICD-10-CM | POA: Diagnosis not present

## 2017-09-06 DIAGNOSIS — N3946 Mixed incontinence: Secondary | ICD-10-CM | POA: Diagnosis not present

## 2017-09-06 DIAGNOSIS — N183 Chronic kidney disease, stage 3 (moderate): Secondary | ICD-10-CM | POA: Diagnosis not present

## 2017-09-06 DIAGNOSIS — C9001 Multiple myeloma in remission: Secondary | ICD-10-CM | POA: Diagnosis not present

## 2017-09-06 MED ORDER — MIRABEGRON ER 50 MG PO TB24: 50.0000 mg | ORAL_TABLET | Freq: Every day | ORAL | 11 refills | Status: DC

## 2017-09-06 NOTE — Progress Notes (Signed)
Here for follow up. Per pt "doing pretty good "  Stated energy level low she stated.

## 2017-09-06 NOTE — Progress Notes (Signed)
09/06/2017 10:36 AM   Sydney Gillespie 1940/06/23 [id number]  Referring provider: Tracie Gillespie, [facility] [place] Gillespie, [place] Gillespie [address]  No chief complaint on file.   HPI: I was consulted to assess the patient's urinary incontinence worsening in the last month.  It is been present for years.  She leaks with coughing sneezing bending and lifting.  She has urge incontinence. Both are significant.  She says her main complaint is when she goes from a sitting to standing position the urine just comes out.  She wears 3 or 4 pads a day that can be soaked.  She has moderately severe bedwetting.  Her flow is lessened some recently.  She can have urgency and then has a little bit of trouble urinating. She voids 2 or 3 times a night and every 90 minutes during the day  Mild hypermobility of the bladder neck and a mild positive cough test with no prolapse  The patient has mixed incontinence.  I think both likely are significant.  I wonder if the overactive bladder component is the most significant.  She does have milder to moderate frequency and nocturia.  She also has moderately severe bedwetting.  She likely is triggering overactivity when she goes from a sitting to standing position but she does have reasonably significant stress incontinence.  If the patient ever did have her stress incontinence treated urethral injectables or sling would be reasonable  Today We can see and incontinence are stable On urodynamics the patient did not void and was catheterized for 75 mL.  Bladder capacity was 220 mL.  The patient had entry urgency.  The bladder was unstable reaching pressures of 10 cm of water associated with severe leakage.  She was triggering a lot of overactivity when she would cough.  It made assessment of stress incontinence more difficult.  I do 150 mL her cough leak point pressure is 58 cm of water associated with mild to moderate leakage.  There was a lot  of triggering.  During voluntary voiding she voided 57 mL with a maximum flow of 8 mils per second and a residual was 100 mL.  Bladder neck descent at 1 cm.  The leakage was severe with her bladder overactivity.  It was moderate in severity with stress maneuvers.  The details of the urodynamics are signed and dictated     PMH: Past Medical History:  Diagnosis Date  . Allergic rhinitis   . Allergy   . Anemia   . Anemia   . Barrett's esophagus   . Blood dyscrasia    multiple myloma remission  . Cancer ([place])   . Change in bowel habits   . Compression fracture 2013  . Degenerative disc disease, lumbar   . Dysrhythmia    palpitations  . Esophageal reflux   . Family history of adverse reaction to anesthesia    nausea -mom  . GERD (gastroesophageal reflux disease)   . Gout   . 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[place])   . Heart palpitations   . Hyperlipidemia   . Hypothyroidism   . Inflammatory polyarthropathy ([place])   . Lumbago   . Lumbar radiculitis   . Lumbar stenosis with neurogenic claudication   . Multiple myeloma ([place])   . Multiple myeloma ([place])   . Osteopenia   . Osteoporosis   . Other dysphagia   . Palpitations   . Positive PPD   . Renal insufficiency   . Rheumatoid arthritis ([place])   .  Rheumatoid arthritis ([place])   . Seasonal allergies   . Vertigo     Surgical History: Past Surgical History:  Procedure Laterality Date  . ABDOMINAL HYSTERECTOMY    . APPENDECTOMY    . BACK SURGERY    . BREAST EXCISIONAL BIOPSY Bilateral    neg  . CHOLECYSTECTOMY    . COLONOSCOPY WITH PROPOFOL N/A 11/12/2016   Procedure: COLONOSCOPY WITH PROPOFOL;  Surgeon: Manya Silvas, MD;  [facility] ENDOSCOPY;  Service: Endoscopy;  Laterality: N/A;  . ESOPHAGOGASTRODUODENOSCOPY (EGD) WITH PROPOFOL N/A 11/12/2016   Procedure: ESOPHAGOGASTRODUODENOSCOPY (EGD) WITH PROPOFOL;  Surgeon: Manya Silvas, MD;  Location: [facility] ENDOSCOPY;  Service: Endoscopy;  Laterality: N/A;  . FOOT SURGERY    .  LUMBAR LAMINECTOMY/DECOMPRESSION MICRODISCECTOMY Left 06/18/2016   Procedure: LAMINECTOMY FOR FACET/SYNOVIAL CYST LUMBAR THREE - LUMBAR FOUR LEFT;  Surgeon: Newman Pies, MD;  Location: [place];  Service: Neurosurgery;  Laterality: Left;  LAMINECTOMY FOR FACET/SYNOVIAL CYST LUMBAR THREE - LUMBAR FOUR LEFT    Home Medications:  Allergies as of 09/06/2017      Reactions   Isoniazid Other (See Comments)   Blisters    Keflex [cephalexin] Other (See Comments)   Blisters      Medication List        Accurate as of 09/06/17 10:36 AM. Always use your most recent med list.          allopurinol 300 MG tablet Commonly known as:  ZYLOPRIM Take 300 mg by mouth daily.   ALPRAZolam 0.25 MG tablet Commonly known as:  XANAX Take 0.25 mg by mouth at bedtime.   atenolol 50 MG tablet Commonly known as:  TENORMIN Take 50 mg by mouth daily.   B-12 2500 MCG Tabs Take 1 tablet by mouth daily.   cetirizine 10 MG tablet Commonly known as:  ZYRTEC Take 10 mg by mouth 2 (two) times daily.   diclofenac sodium 1 % Gel Commonly known as:  VOLTAREN Apply 2 g topically 4 (four) times daily.   escitalopram 10 MG tablet Commonly known as:  LEXAPRO Take 10 mg by mouth daily.   fexofenadine 180 MG tablet Commonly known as:  ALLEGRA Take 180 mg by mouth 2 (two) times daily.   fluticasone 50 MCG/ACT nasal spray Commonly known as:  FLONASE Place 2 sprays into the nose daily.   hydroxychloroquine 200 MG tablet Commonly known as:  PLAQUENIL TAKE 2 TABLETS BY MOUTH DAILY   levothyroxine 50 MCG tablet Commonly known as:  SYNTHROID, LEVOTHROID Take 50 mcg by mouth daily before breakfast.   pantoprazole 40 MG tablet Commonly known as:  PROTONIX Take 1 tablet (40 mg total) by mouth daily.   sodium bicarbonate 650 MG tablet TK 1 T PO BID   spironolactone 25 MG tablet Commonly known as:  ALDACTONE   traMADol 50 MG tablet Commonly known as:  ULTRAM Take 50 mg by mouth every 6 (six) hours as  needed for moderate pain.   triamterene-hydrochlorothiazide 37.5-25 MG tablet Commonly known as:  MAXZIDE-25 TAKE 1 TABLET BY MOUTH DAILY AS NEEDED FOR WATER RETENTION       Allergies:  Allergies  Allergen Reactions  . Isoniazid Other (See Comments)    Blisters   . Keflex [Cephalexin] Other (See Comments)    Blisters     Family History: Family History  Problem Relation Age of Onset  . Hypertension Mother   . Heart attack Mother   . Rheum arthritis Mother   . Osteoarthritis Mother   .  Cancer Father   . Hypertension Father   . Stroke Father   . Breast cancer Maternal Grandmother 40  . Osteoarthritis Other   . Rheum arthritis Other     Social History:  reports that she has never smoked. She has never used smokeless tobacco. She reports that she does not drink alcohol or use drugs.  ROS:                                        Physical Exam: There were no vitals taken for this visit.  Constitutional:  Alert and oriented, No acute distress. HEENT: [place] AT, moist mucus membranes.  Trachea midline, no masses.  Laboratory Data: Lab Results  Component Value Date   WBC 6.1 08/30/2017   HGB 12.0 08/30/2017   HCT 35.0 08/30/2017   MCV 95.5 08/30/2017   PLT 172 08/30/2017    Lab Results  Component Value Date   CREATININE 1.97 (H) 08/30/2017     Urinalysis    Component Value Date/Time   APPEARANCEUR Clear 07/12/2017 1407   GLUCOSEU Negative 07/12/2017 1407   BILIRUBINUR Negative 07/12/2017 1407   PROTEINUR Negative 07/12/2017 1407   NITRITE Negative 07/12/2017 1407   LEUKOCYTESUR Negative 07/12/2017 1407    Pertinent Imaging: none  Assessment & Plan: In my opinion approximately 80% of the patient's bladder dysfunction is due to an overactive bladder.  Medical and behavioral therapy discussed including physical therapy.  If she does not reach her goal one would consider an outlet procedure but I would want to re-quantitate her goals and  stress incontinence recognizing the refractory overactive bladder therapy may be prudent to consider instead, especially with her bedwetting and triggering with standing.  Reassess on Myrbetriq in 5 weeks  There are no diagnoses linked to this encounter.  No follow-ups on file.  Reece Packer, MD  [facility] [address] [place], [place] [address] [phone]

## 2017-09-06 NOTE — Assessment & Plan Note (Addendum)
#  Multiple myeloma-currently in remission. 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  Currently on surveillance. No maintenance therapy.  # Clinically no evidence of recurrence.  April 2019-M protein absent; Lambda light chain ratio slightly elevated 2.29.   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 Hb-12; iron sat- 30%; NO IV iron.   # # CKD stage III- IVcreatinine- 2.0/ stable [Dr.Singh]   # fatigue-recently Synthroid dose increased from 50-75 in March 2019 when TSH was found at 7.5 by PCP.  Recommend [facility] care program  # In 6 months CBC, CMP, myeloma panel, and iron studies.  One week later MD with possible Venofer.  [facility] care program referral.   Cc: Dr. Ginette Pitman

## 2017-09-06 NOTE — Progress Notes (Signed)
[place] OFFICE PROGRESS NOTE  Patient Care Team: [name], MD as PCP - General (Internal Medicine)  HPI  SUMMARY OF ONCOLOGIC HISTORY:  Oncology History   # 2012- MULTIPLE MYELOMA IgG Kappa Light chain FISH del13; s/p AUTO- BMT [MAY 2013; Dr.Gabriel; [facility]] AUG 2017- M-PROTEIN NEG; K/L= 1.49/N; NO MAINTENANCE THERAPY  # CKD [~ creat 1.35- 1.7] ; Dr.Singh  # 2012- LEFT KIDNEY ? 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 benign     Multiple myeloma in remission ([place])   12/18/2015 Initial Diagnosis    Multiple myeloma in remission [facility])            INTERVAL HISTORY: This is a pleasant 77 year-old female patient with above history of multiple Myeloma [status post autologous stem cell transplant May 2013 patient denies any] currently on surveillance/no maintenance therapy- Is here for follow-up.  Patient denies any unusual bone pain.  No nausea no vomiting.  No swelling in the legs.  Does complain of moderate to severe fatigue.  Not on p.o. iron because of intolerance.  Patient denies any tingling or numbness in the extremities.Marland Kitchen Appetite is good. No weight loss. Patient denies any worsening pain anywhere.    REVIEW OF SYSTEMS:   10 point review of system is done is negative for mentioned above and in history of present illness  I have reviewed the past medical history, past surgical history, social history and family history with the patient and they are unchanged from previous note unless stated above.  ALLERGIES:  is allergic to isoniazid and keflex [cephalexin].  MEDICATIONS:  Current Outpatient Medications  Medication Sig Dispense Refill  . allopurinol (ZYLOPRIM) 300 MG tablet Take 300 mg by mouth daily.     Marland Kitchen ALPRAZolam (XANAX) 0.25 MG tablet Take 0.25 mg by mouth at bedtime.   0  . atenolol (TENORMIN) 50 MG tablet Take 50 mg by mouth daily.     . cetirizine (ZYRTEC) 10 MG tablet Take 10 mg by mouth 2 (two) times daily.      . fexofenadine (ALLEGRA) 180 MG tablet Take 180 mg by mouth 2 (two) times daily.    . fluticasone (FLONASE) 50 MCG/ACT nasal spray Place 2 sprays into the nose daily.     . hydroxychloroquine (PLAQUENIL) 200 MG tablet TAKE 2 TABLETS BY MOUTH DAILY    . levothyroxine (SYNTHROID, LEVOTHROID) 50 MCG tablet Take 75 mcg by mouth daily before breakfast.     . spironolactone (ALDACTONE) 25 MG tablet     . Cyanocobalamin (B-12) 2500 MCG TABS Take 1 tablet by mouth daily.    . diclofenac sodium (VOLTAREN) 1 % GEL Apply 2 g topically 4 (four) times daily.    Marland Kitchen escitalopram (LEXAPRO) 10 MG tablet Take 10 mg by mouth daily.    . mirabegron ER (MYRBETRIQ) 50 MG TB24 tablet Take 1 tablet (50 mg total) by mouth daily. (Patient not taking: Reported on 09/06/2017) 30 tablet 11  . pantoprazole (PROTONIX) 40 MG tablet Take 1 tablet (40 mg total) by mouth daily. (Patient not taking: Reported on 09/06/2017) 90 tablet 3  . sodium bicarbonate 650 MG tablet TK 1 T PO BID  3  . traMADol (ULTRAM) 50 MG tablet Take 50 mg by mouth every 6 (six) hours as needed for moderate pain.     Marland Kitchen triamterene-hydrochlorothiazide (MAXZIDE-25) 37.5-25 MG per tablet TAKE 1 TABLET BY MOUTH DAILY AS NEEDED FOR WATER RETENTION     No current facility-administered medications  for this visit.     PHYSICAL EXAMINATION: ECOG PERFORMANCE STATUS: 0 - Asymptomatic  BP 136/76 (BP Location: Left Arm, Patient Position: Sitting)   Pulse 75   Temp (!) 97.2 F (36.2 C) (Tympanic)   Resp 18   Wt 182 lb (82.6 kg)   BMI 28.51 kg/m   Filed Weights   09/06/17 1336  Weight: 182 lb (82.6 kg)    GENERAL:alert, no distress and comfortable.Accompanied from family. EYES: normal, Conjunctiva are pink and non-injected, sclera clear OROPHARYNX:no exudate, no erythema and lips, buccal mucosa, and tongue normal  NECK: No thyromegaly LYMPH:  no palpable lymphadenopathy in the cervical, axillary or inguinal LUNGS: clear to auscultation with normal  breathing effort; No Wheeze or crackles  Cardio-vascular- Regular Rate & Rythm and no murmurs and no lower extremity edema ABDOMEN:abdomen soft, non-tender and normal bowel sounds; No hepato-splenomegaly.  Musculoskeletal:no cyanosis of digits and no clubbing  NEURO: alert & oriented x 3 with fluent speech, no focal motor/sensory deficits. SKIN: ecchymosis bilateral upper extremities [chronic]  LABORATORY DATA:  I have reviewed the data as listed    Component Value Date/Time   NA 137 08/30/2017 1058   NA 142 08/16/2013 1028   K 4.1 08/30/2017 1058   K 4.5 08/16/2013 1028   CL 109 08/30/2017 1058   CL 106 08/16/2013 1028   CO2 19 (L) 08/30/2017 1058   CO2 27 08/16/2013 1028   GLUCOSE 99 08/30/2017 1058   GLUCOSE 99 08/16/2013 1028   BUN 34 (H) 08/30/2017 1058   BUN 21 (H) 08/16/2013 1028   CREATININE 1.97 (H) 08/30/2017 1058   CREATININE 1.42 (H) 09/07/2014 0954   CALCIUM 9.5 08/30/2017 1058   CALCIUM 9.3 09/07/2014 0954   PROT 7.2 08/30/2017 1058   PROT 7.4 08/16/2013 1028   ALBUMIN 4.2 08/30/2017 1058   ALBUMIN 3.9 08/16/2013 1028   AST 39 08/30/2017 1058   AST 27 08/16/2013 1028   ALT 13 (L) 08/30/2017 1058   ALT 15 08/16/2013 1028   ALKPHOS 70 08/30/2017 1058   ALKPHOS 61 08/16/2013 1028   BILITOT 0.3 08/30/2017 1058   BILITOT 0.3 08/16/2013 1028   GFRNONAA 23 (L) 08/30/2017 1058   GFRNONAA 36 (L) 09/07/2014 0954   GFRAA 27 (L) 08/30/2017 1058   GFRAA 42 (L) 09/07/2014 0954    No results found for: SPEP, UPEP  Lab Results  Component Value Date   WBC 6.1 08/30/2017   NEUTROABS 4.1 08/30/2017   HGB 12.0 08/30/2017   HCT 35.0 08/30/2017   MCV 95.5 08/30/2017   PLT 172 08/30/2017      Chemistry      Component Value Date/Time   NA 137 08/30/2017 1058   NA 142 08/16/2013 1028   K 4.1 08/30/2017 1058   K 4.5 08/16/2013 1028   CL 109 08/30/2017 1058   CL 106 08/16/2013 1028   CO2 19 (L) 08/30/2017 1058   CO2 27 08/16/2013 1028   BUN 34 (H) 08/30/2017  1058   BUN 21 (H) 08/16/2013 1028   CREATININE 1.97 (H) 08/30/2017 1058   CREATININE 1.42 (H) 09/07/2014 0954      Component Value Date/Time   CALCIUM 9.5 08/30/2017 1058   CALCIUM 9.3 09/07/2014 0954   ALKPHOS 70 08/30/2017 1058   ALKPHOS 61 08/16/2013 1028   AST 39 08/30/2017 1058   AST 27 08/16/2013 1028   ALT 13 (L) 08/30/2017 1058   ALT 15 08/16/2013 1028   BILITOT 0.3 08/30/2017 1058   BILITOT  0.3 08/16/2013 1028       RADIOGRAPHIC STUDIES: I have personally reviewed the radiological images as listed and agreed with the findings in the report. No results found.   ASSESSMENT & PLAN:    Multiple myeloma in remission [facility]) # Multiple myeloma-currently in remission. 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  Currently on surveillance. No maintenance therapy.  # Clinically no evidence of recurrence.  April 2019-M protein absent; Lambda light chain ratio slightly elevated 2.29.   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 Hb-12; iron sat- 30%; NO IV iron.   # # CKD stage III- IVcreatinine- 2.0/ stable [Dr.Singh]   # fatigue-recently Synthroid dose increased from 50-75 in March 2019 when TSH was found at 7.5 by PCP.  Recommend [facility] care program  # In 6 months CBC, CMP, myeloma panel, and iron studies.  One week later MD with possible Venofer.  [facility] care program referral.   Cc: Dr. Hazeline Junker, MD 09/07/2017 8:15 AM

## 2017-09-08 ENCOUNTER — Ambulatory Visit: Payer: Medicare Other | Attending: Rheumatology | Admitting: Occupational Therapy

## 2017-09-08 ENCOUNTER — Encounter: Payer: Self-pay | Admitting: Occupational Therapy

## 2017-09-08 ENCOUNTER — Other Ambulatory Visit: Payer: Self-pay

## 2017-09-08 DIAGNOSIS — M79642 Pain in left hand: Secondary | ICD-10-CM | POA: Diagnosis present

## 2017-09-08 DIAGNOSIS — M6281 Muscle weakness (generalized): Secondary | ICD-10-CM | POA: Insufficient documentation

## 2017-09-08 DIAGNOSIS — M25642 Stiffness of left hand, not elsewhere classified: Secondary | ICD-10-CM | POA: Insufficient documentation

## 2017-09-08 DIAGNOSIS — M25641 Stiffness of right hand, not elsewhere classified: Secondary | ICD-10-CM | POA: Diagnosis present

## 2017-09-08 DIAGNOSIS — M79641 Pain in right hand: Secondary | ICD-10-CM | POA: Diagnosis present

## 2017-09-08 NOTE — Therapy (Signed)
[facility] [address] [place], [address] Phone: [phone]   Fax:  [phone]  Occupational Therapy Evaluation  Patient Details  Name: Sydney Gillespie MRN: [id number] Date of Birth: 04-19-1941 Referring Provider: Jefm Bryant    Encounter Date: 09/08/2017  OT End of Session - 09/08/17 2059    Visit Number  1    Number of Visits  3    Date for OT Re-Evaluation  09/29/17    OT Start Time  1005    OT Stop Time  1055    OT Time Calculation (min)  50 min    Activity Tolerance  Patient tolerated treatment well    Behavior During Therapy  [facility] for tasks assessed/performed       Past Medical History:  Diagnosis Date  . Allergic rhinitis   . Allergy   . Anemia   . Anemia   . Barrett's esophagus   . Blood dyscrasia    multiple myloma remission  . Cancer ([place])   . Change in bowel habits   . Compression fracture 2013  . Degenerative disc disease, lumbar   . Dysrhythmia    palpitations  . Esophageal reflux   . Family history of adverse reaction to anesthesia    nausea -mom  . GERD (gastroesophageal reflux disease)   . Gout   . 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[place])   . Heart palpitations   . Hyperlipidemia   . Hypothyroidism   . Inflammatory polyarthropathy ([place])   . Lumbago   . Lumbar radiculitis   . Lumbar stenosis with neurogenic claudication   . Multiple myeloma ([place])   . Multiple myeloma ([place])   . Osteopenia   . Osteoporosis   . Other dysphagia   . Palpitations   . Positive PPD   . Renal insufficiency   . Rheumatoid arthritis ([place])   . Rheumatoid arthritis ([place])   . Seasonal allergies   . Vertigo     Past Surgical History:  Procedure Laterality Date  . ABDOMINAL HYSTERECTOMY    . APPENDECTOMY    . BACK SURGERY    . BREAST EXCISIONAL BIOPSY Bilateral    neg  . CHOLECYSTECTOMY    . COLONOSCOPY WITH PROPOFOL N/A 11/12/2016   Procedure: COLONOSCOPY WITH PROPOFOL;  Surgeon: Manya Silvas, MD;  Location: [facility] ENDOSCOPY;  Service: Endoscopy;  Laterality: N/A;  . ESOPHAGOGASTRODUODENOSCOPY (EGD) WITH PROPOFOL N/A 11/12/2016   Procedure: ESOPHAGOGASTRODUODENOSCOPY (EGD) WITH PROPOFOL;  Surgeon: Manya Silvas, MD;  Location: [facility] ENDOSCOPY;  Service: Endoscopy;  Laterality: N/A;  . FOOT SURGERY    . LUMBAR LAMINECTOMY/DECOMPRESSION MICRODISCECTOMY Left 06/18/2016   Procedure: LAMINECTOMY FOR FACET/SYNOVIAL CYST LUMBAR THREE - LUMBAR FOUR LEFT;  Surgeon: Newman Pies, MD;  Location: [place];  Service: Neurosurgery;  Laterality: Left;  LAMINECTOMY FOR FACET/SYNOVIAL CYST LUMBAR THREE - LUMBAR FOUR LEFT    There were no vitals filed for this visit.  Subjective Assessment - 09/08/17 1100    Subjective   I seen you about 4 yrs ago -and do have my paraffin bath but not using it like I should maybe - I had flareup in my hands about early March - and had predisone but still my bilateral middle fingers are the worse and pain 4-8/10 pain  - cannot hold on to things     Patient Stated Goals  I want the pain to get better , using it - for gripping , holding on to things - I  like to cook     Currently in Pain?  Yes    Pain Score  4     Pain Location  Finger (Comment which one)    Pain Orientation  Right;Left    Pain Descriptors / Indicators  Aching;Shooting    Pain Type  Chronic pain    Pain Onset  More than a month ago    Pain Frequency  Constant        [facility] OT Assessment - 09/08/17 0001      Assessment   Medical Diagnosis  Bilateral hand pain , RA     Referring Provider  Jefm Bryant     Onset Date/Surgical Date  07/09/17    Hand Dominance  Right      Home  Environment   Lives With  Spouse      Prior Function   Vocation  Retired    Leisure  Likes to [employer] , bible study group, read on tablet and some games       Strength   Right Hand Grip (lbs)  25    Right Hand Lateral Pinch  11 lbs    Right Hand 3 Point Pinch  5 lbs    Left Hand Grip (lbs)  40    Left Hand Lateral  Pinch  11 lbs    Left Hand 3 Point Pinch  9 lbs      Right Hand AROM   R Long PIP 0-100  85 Degrees    R Little PIP 0-100  95 Degrees      Left Hand AROM   L Long PIP 0-100  95 Degrees         Paraffin bath for decrease pain in bilateral hands   Prior to review of HEP    AROM tendon glides  compression sleeves to bilateral middle digits  For night time   Joint protection review            OT Education - 09/08/17 2058    Education provided  Yes    Education Details  HEP review and findings of eval     Person(s) Educated  Patient    Methods  Explanation;Demonstration    Comprehension  Verbalized understanding;Returned demonstration       OT Short Term Goals - 09/08/17 2111      OT SHORT TERM GOAL #1   Title  Pain on PRWHE improve with more than 20 points     Baseline  Pain at eval on PRWHE 38/50    Time  3    Period  Weeks    Status  New    Target Date  09/29/17        OT Long Term Goals - 09/08/17 2112      OT LONG TERM GOAL #1   Title  Pt to be ind in HEP to decrease pain increase AROM at PIP by at least 5 degrees and increase grip on R by 5 lbs     Baseline  3rd PIP's bilateral 85 - 95 degrees -     Time  3    Period  Weeks    Status  New    Target Date  09/29/17      OT LONG TERM GOAL #2   Title  Pt to verbalize 3 joint protection principles and AE  to decrease pain and  improve ease in ADL's and IADL's     Time  3    Period  Weeks  Status  New    Target Date  09/29/17            Plan - 09/08/17 2106    Clinical Impression Statement  Pt present with diagnosis of RA , with increase pain the last few months - was put on predisone taper - but cont to have pain  in PIP's and DIP of R hand more than L hand - increase edema at bilateral 3rd PIP's and R 4th PIP - pt show increase pain 4-8/10 pain , and decrease ROM and grip strength - limitiing her functional use in ADL's and IADL's -     Occupational performance deficits (Please refer to  evaluation for details):  ADL's;IADL's;Play;Leisure    Rehab Potential  Fair    Current Impairments/barriers affecting progress:  chonic condition     OT Frequency  1x / week    OT Duration  4 weeks    OT Treatment/Interventions  Self-care/ADL training;Therapeutic exercise;Ultrasound;Manual Therapy;Paraffin;Patient/family education    Plan  assess progress with HEP in pain , edema of PIP's and joint protection     Clinical Decision Making  Several treatment options, min-mod task modification necessary    OT Home Exercise Plan  see pt instruction     Consulted and Agree with Plan of Care  Patient       Patient will benefit from skilled therapeutic intervention in order to improve the following deficits and impairments:  Increased edema, Impaired flexibility, Pain, Decreased coordination, Decreased strength, Impaired UE functional use  Visit Diagnosis: Pain in left hand - Plan: Ot plan of care cert/re-cert  Pain in right hand - Plan: Ot plan of care cert/re-cert  Stiffness of left hand, not elsewhere classified - Plan: Ot plan of care cert/re-cert  Stiffness of right hand, not elsewhere classified - Plan: Ot plan of care cert/re-cert  Muscle weakness (generalized) - Plan: Ot plan of care cert/re-cert    Problem List Patient Active Problem List   Diagnosis Date Noted  . Anemia due to stage 3 chronic kidney disease ([place]) 12/28/2016  . Lumbar radiculopathy 06/18/2016  . Fatigue 01/29/2016  . Low serum vitamin D 01/29/2016  . Multiple myeloma in remission ([place]) 12/18/2015  . Spondylolisthesis of lumbar region 11/25/2015  . Soft tissue lesion of shoulder region 09/11/2015  . Acid reflux 09/11/2015  . Rheumatoid arthritis involving multiple joints ([place]) 09/11/2015  . 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 Degeneration of intervertebral disc of lumbar region 10/18/2013  . Neuritis or radiculitis due to rupture of lumbar intervertebral disc 10/18/2013  . Degenerative  arthritis of lumbar spine 10/18/2013  . Allergic rhinitis 09/27/2013  . HLD (hyperlipidemia) 09/27/2013  . Awareness of heartbeats 09/27/2013  . Gout 09/21/2013  . Abnormal result of Mantoux test 09/21/2013  . Impaired renal function 09/21/2013  . 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  . OP (osteoporosis) 10/13/2011  . 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 Rheumatoid arthritis ([place]) 09/23/2011    Rosalyn Gess OTR/L,CLT 09/08/2017, 9:19 PM  [facility] [address] [place], [address] Phone: [phone]   Fax:  [phone]  Name: MARGEE TRENTHAM MRN: [id number] Date of Birth: February 04, 1941

## 2017-09-08 NOTE — Patient Instructions (Signed)
Paraffin  AROM tendon glides  compression sleeves to bilateral middle digits  For night time   Joint protection review

## 2017-09-21 ENCOUNTER — Ambulatory Visit: Payer: Medicare Other | Admitting: Occupational Therapy

## 2017-09-21 DIAGNOSIS — M25641 Stiffness of right hand, not elsewhere classified: Secondary | ICD-10-CM

## 2017-09-21 DIAGNOSIS — M79642 Pain in left hand: Secondary | ICD-10-CM

## 2017-09-21 DIAGNOSIS — M79641 Pain in right hand: Secondary | ICD-10-CM

## 2017-09-21 DIAGNOSIS — M25642 Stiffness of left hand, not elsewhere classified: Secondary | ICD-10-CM

## 2017-09-21 DIAGNOSIS — M6281 Muscle weakness (generalized): Secondary | ICD-10-CM

## 2017-09-21 NOTE — Therapy (Signed)
[facility] [address], [place], [address] Phone: [phone]   Fax:  [phone]  Occupational Therapy Treatment  Patient Details  Name: Sydney Gillespie MRN: [id number] Date of Birth: 1940-05-22 Referring Provider: Jefm Bryant    Encounter Date: 09/21/2017  OT End of Session - 09/21/17 1035    Visit Number  2    Number of Visits  3    Date for OT Re-Evaluation  09/29/17    OT Start Time  1010    OT Stop Time  1028    OT Time Calculation (min)  18 min    Activity Tolerance  Patient tolerated treatment well    Behavior During Therapy  [facility] for tasks assessed/performed       Past Medical History:  Diagnosis Date  . Allergic rhinitis   . Allergy   . Anemia   . Anemia   . Barrett's esophagus   . Blood dyscrasia    multiple myloma remission  . Cancer ([place])   . Change in bowel habits   . Compression fracture 2013  . Degenerative disc disease, lumbar   . Dysrhythmia    palpitations  . Esophageal reflux   . Family history of adverse reaction to anesthesia    nausea -mom  . GERD (gastroesophageal reflux disease)   . Gout   . 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[place])   . Heart palpitations   . Hyperlipidemia   . Hypothyroidism   . Inflammatory polyarthropathy ([place])   . Lumbago   . Lumbar radiculitis   . Lumbar stenosis with neurogenic claudication   . Multiple myeloma ([place])   . Multiple myeloma ([place])   . Osteopenia   . Osteoporosis   . Other dysphagia   . Palpitations   . Positive PPD   . Renal insufficiency   . Rheumatoid arthritis ([place])   . Rheumatoid arthritis ([place])   . Seasonal allergies   . Vertigo     Past Surgical History:  Procedure Laterality Date  . ABDOMINAL HYSTERECTOMY    . APPENDECTOMY    . BACK SURGERY    . BREAST EXCISIONAL BIOPSY Bilateral    neg  . CHOLECYSTECTOMY    . COLONOSCOPY WITH PROPOFOL N/A 11/12/2016   Procedure: COLONOSCOPY WITH PROPOFOL;  Surgeon: Manya Silvas, MD;  Location: [facility] ENDOSCOPY;  Service: Endoscopy;  Laterality: N/A;  . ESOPHAGOGASTRODUODENOSCOPY (EGD) WITH PROPOFOL N/A 11/12/2016   Procedure: ESOPHAGOGASTRODUODENOSCOPY (EGD) WITH PROPOFOL;  Surgeon: Manya Silvas, MD;  Location: [facility] ENDOSCOPY;  Service: Endoscopy;  Laterality: N/A;  . FOOT SURGERY    . LUMBAR LAMINECTOMY/DECOMPRESSION MICRODISCECTOMY Left 06/18/2016   Procedure: LAMINECTOMY FOR FACET/SYNOVIAL CYST LUMBAR THREE - LUMBAR FOUR LEFT;  Surgeon: Newman Pies, MD;  Location: [facility];  Service: Neurosurgery;  Laterality: Left;  LAMINECTOMY FOR FACET/SYNOVIAL CYST LUMBAR THREE - LUMBAR FOUR LEFT    There were no vitals filed for this visit.  Subjective Assessment - 09/21/17 1033    Subjective   Doing better - I did use my paraffin bath and did my exercises and my hands and fingers not as tight - and pain at the worse was about 3-4/10 this past week     Patient Stated Goals  I want the pain to get better , using it - for gripping , holding on to things - I like to cook     Currently in Pain?  No/denies         [facility] OT  Assessment - 09/21/17 0001      Strength   Right Hand Grip (lbs)  41    Right Hand Lateral Pinch  12 lbs    Right Hand 3 Point Pinch  6 lbs    Left Hand Grip (lbs)  43    Left Hand Lateral Pinch  13 lbs    Left Hand 3 Point Pinch  9 lbs      Right Hand AROM   R Long PIP 0-100  95 Degrees    R Little PIP 0-100  100 Degrees      Left Hand AROM   L Long PIP 0-100  100 Degrees       Pt report pain improve to 0-4 /10 at the worse the last week  She started using her Paraffin bath again -and doing her AROM tendon glides   measure her AROM and grip/prehension - see flowsheet  great progress   Still tender on lateral sides of 3rd digit R and L  Joints enlarge - but pt has RA   Pt to cont with HEP for pain free AROM after paraffin  And to do contrast if having flareup   Cont doing joint protection  digisleeve info provided for pt - to  order online - provided her 2 new extra sleeves to use at home on 3rd                   OT Education - 09/21/17 1035    Education provided  Yes    Education Details  review discharge instructions     Person(s) Educated  Patient    Methods  Explanation;Demonstration    Comprehension  Verbalized understanding;Returned demonstration       OT Short Term Goals - 09/21/17 1038      OT SHORT TERM GOAL #1   Title  Pain on PRWHE improve with more than 20 points     Baseline  Pain at eval on PRWHE 38/50 and now 15/50    Status  Achieved        OT Long Term Goals - 09/21/17 1038      OT LONG TERM GOAL #1   Title  Pt to be ind in HEP to decrease pain increase AROM at PIP by at least 5 degrees and increase grip on R by 5 lbs     Baseline  R hand improve to 95 3rd PIP and L 100 degrees     Status  Achieved      OT LONG TERM GOAL #2   Title  Pt to verbalize 3 joint protection principles and AE  to decrease pain and  improve ease in ADL's and IADL's     Status  Achieved            Plan - 09/21/17 1037    Clinical Impression Statement  Pt made great progress in AROM in digits - decrease pain and increase grip strength- met all goals - pt independent in HEP again - discharge from OT services at this time     Current Impairments/barriers affecting progress:  chonic condition     OT Home Exercise Plan  see pt instruction     Consulted and Agree with Plan of Care  Patient       Patient will benefit from skilled therapeutic intervention in order to improve the following deficits and impairments:     Visit Diagnosis: Pain in left hand  Pain in right hand  Stiffness of left  hand, not elsewhere classified  Stiffness of right hand, not elsewhere classified  Muscle weakness (generalized)    Problem List Patient Active Problem List   Diagnosis Date Noted  . Anemia due to stage 3 chronic kidney disease ([place]) 12/28/2016  . Lumbar radiculopathy 06/18/2016  .  Fatigue 01/29/2016  . Low serum vitamin D 01/29/2016  . Multiple myeloma in remission ([place]) 12/18/2015  . Spondylolisthesis of lumbar region 11/25/2015  . Soft tissue lesion of shoulder region 09/11/2015  . Acid reflux 09/11/2015  . Rheumatoid arthritis involving multiple joints ([place]) 09/11/2015  . 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 Degeneration of intervertebral disc of lumbar region 10/18/2013  . Neuritis or radiculitis due to rupture of lumbar intervertebral disc 10/18/2013  . Degenerative arthritis of lumbar spine 10/18/2013  . Allergic rhinitis 09/27/2013  . HLD (hyperlipidemia) 09/27/2013  . Awareness of heartbeats 09/27/2013  . Gout 09/21/2013  . Abnormal result of Mantoux test 09/21/2013  . Impaired renal function 09/21/2013  . 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  . OP (osteoporosis) 10/13/2011  . 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 Rheumatoid arthritis ([place]) 09/23/2011    Rosalyn Gess OTR/L,CLT 09/21/2017, 10:39 AM  [facility] [address], [place], [address] Phone: [phone]   Fax:  [phone]  Name: TAYVIA FAUGHNAN MRN: [id number] Date of Birth: December 07, 1940

## 2017-09-21 NOTE — Patient Instructions (Signed)
Cont with tendon glides  Compression digi sleeves for 3rd digit - bilateral   info provided where to order them  Cont use joint protection principles

## 2017-09-26 IMAGING — MR MR CERVICAL SPINE W/O CM
5 series · 35 of 48 positions shown · non-contrast
Comparison: None.

CLINICAL DATA: Left neck and shoulder pain

EXAM:
MRI CERVICAL SPINE WITHOUT CONTRAST
TECHNIQUE: Multiplanar, multisequence MR imaging of the cervical spine was
performed. No intravenous contrast was administered.

[Series 3: T2 · sagittal · 3.0mm · 0.70mm/px · 8 of 15 slices shown (1 of 2)]
[im 1/15]
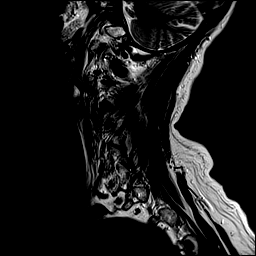
[im 3/15]
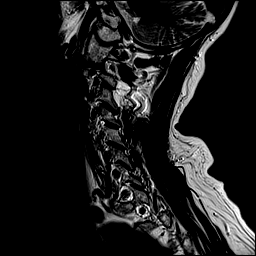
[im 5/15]
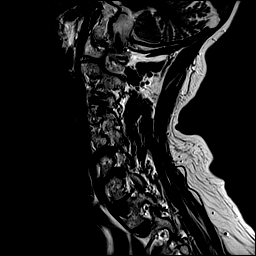
[im 7/15]
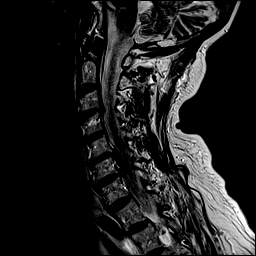
[im 9/15]
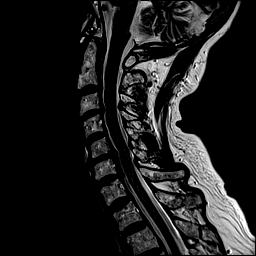
[im 11/15]
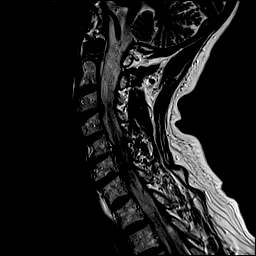
[im 13/15]
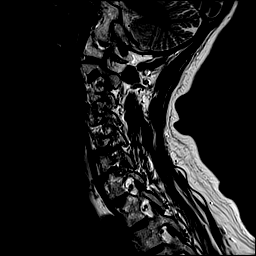
[im 15/15]
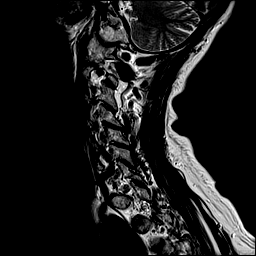

[Series 4: T1 · sagittal · 3.0mm · 0.70mm/px · 7 of 15 slices shown]
[im 1/15]
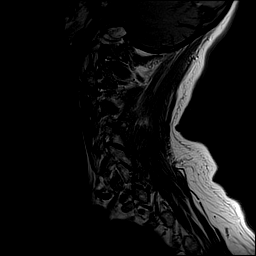
[im 3/15]
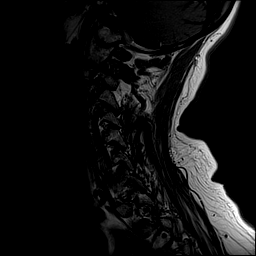
[im 5/15]
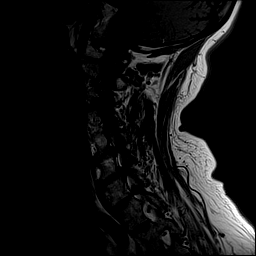
[im 8/15]
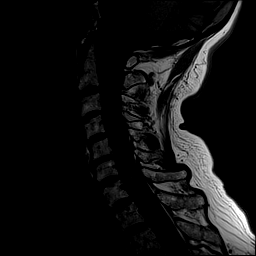
[im 10/15]
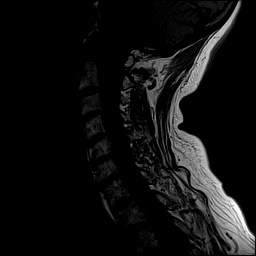
[im 12/15]
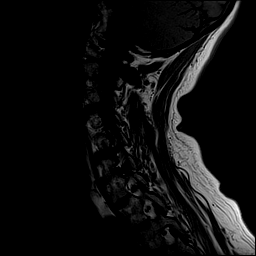
[im 15/15]
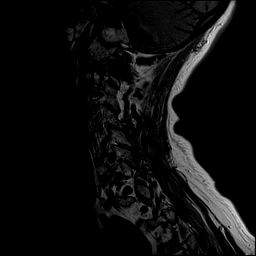

[Series 5: STIR · sagittal · 3.0mm · 0.35mm/px · 7 of 15 slices shown]
[im 1/15]
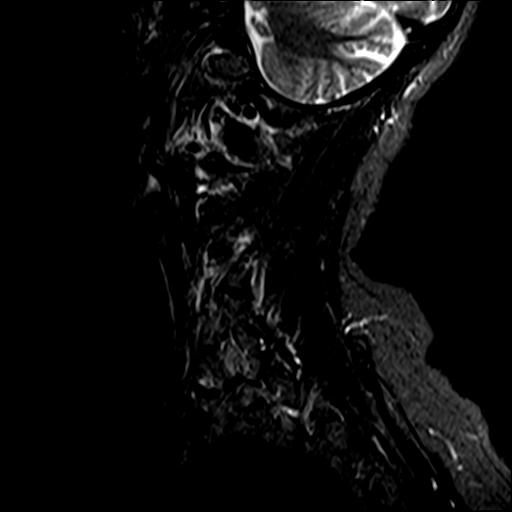
[im 3/15]
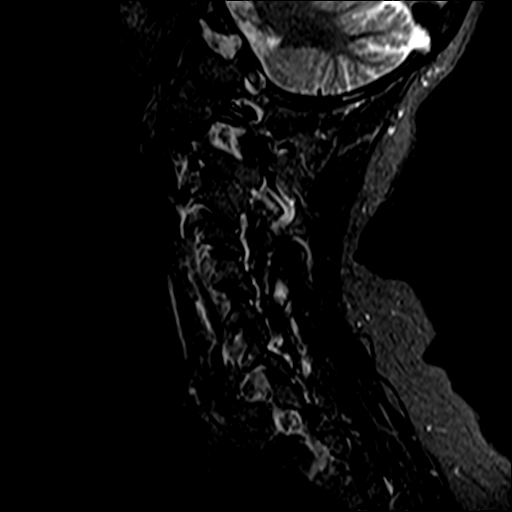
[im 5/15]
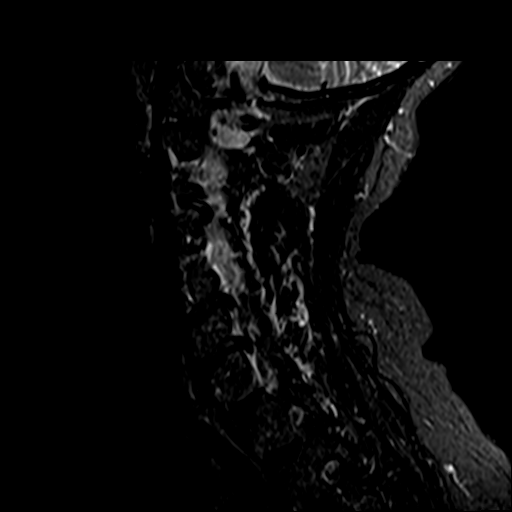
[im 8/15]
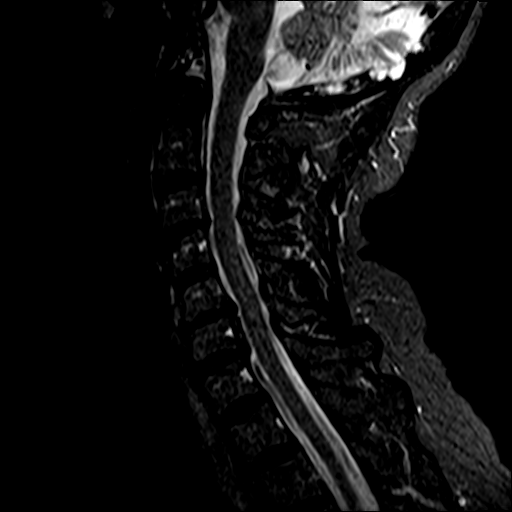
[im 10/15]
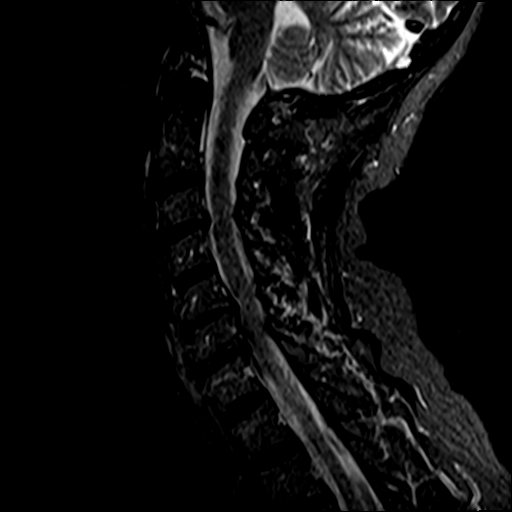
[im 12/15]
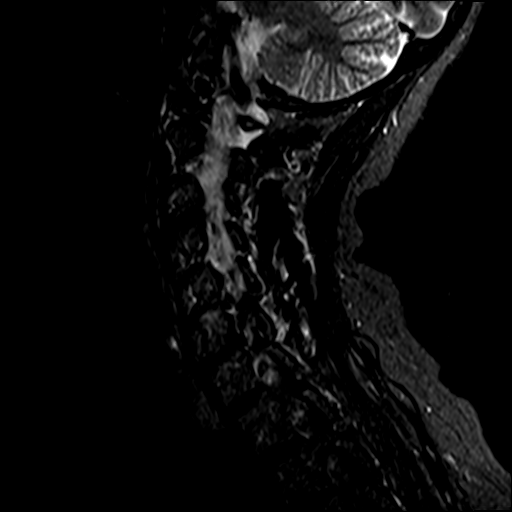
[im 15/15]
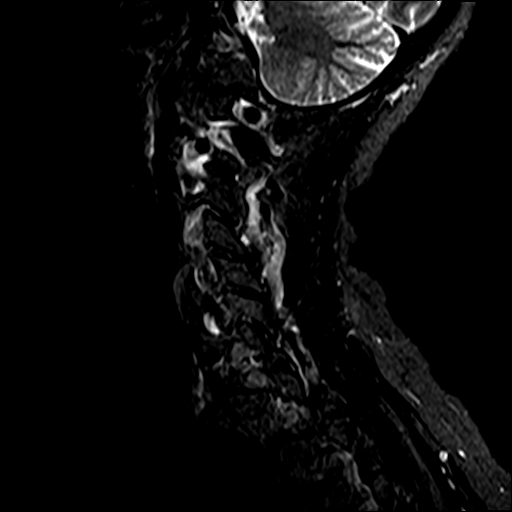

[Series 6: T2 · axial · 3.0mm · 0.70mm/px · z∈[-40,+59]mm · 9 of 27 slices shown (2 of 2)]
[im 1/27]
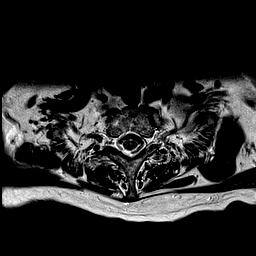
[im 5/27]
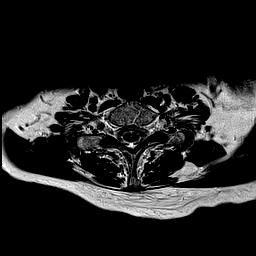
[im 9/27]
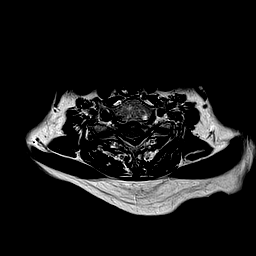
[im 11/27]
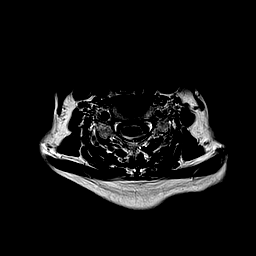
[im 14/27]
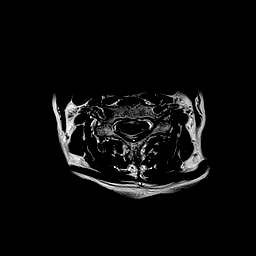
[im 16/27]
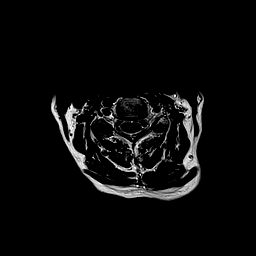
[im 18/27]
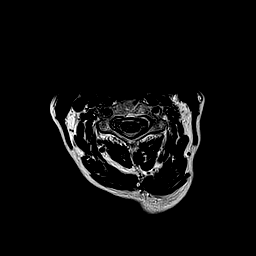
[im 22/27]
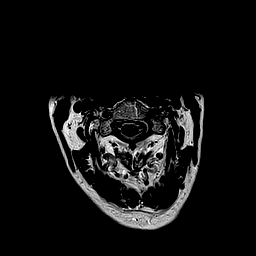
[im 27/27]
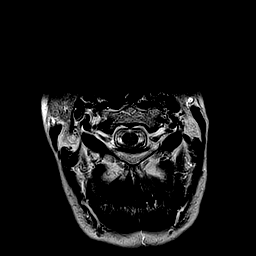

[Series 7: mpgr ax · axial · 3.0mm · 0.35mm/px · z∈[-40,-2]mm · 4 of 27 slices shown]
[im 1/27]
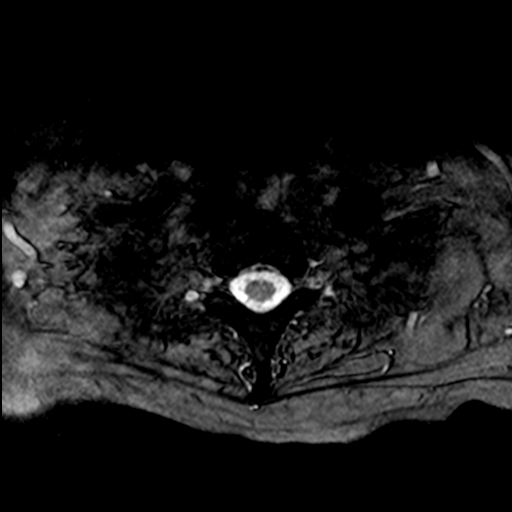
[im 5/27]
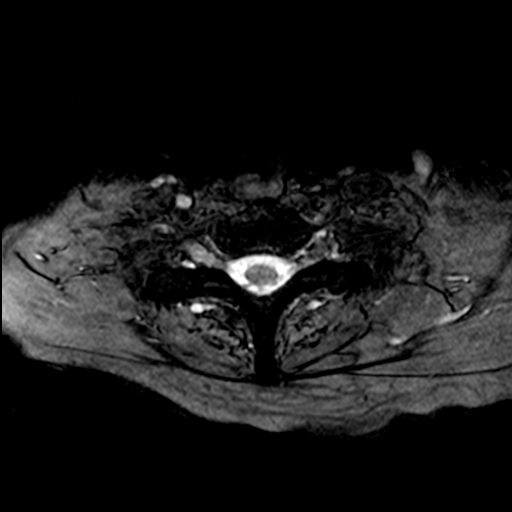
[im 9/27]
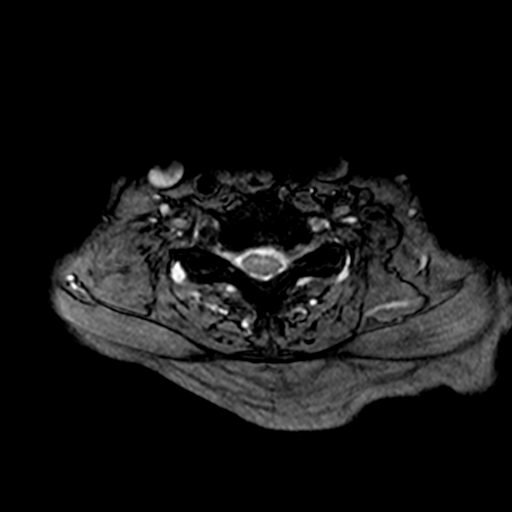
[im 11/27]
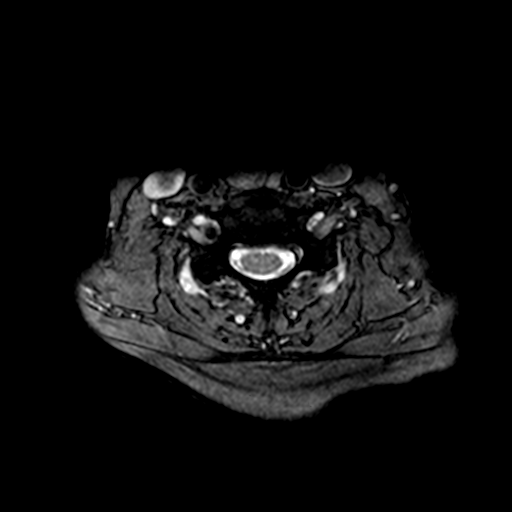

[35 of 48 positions shown; findings below may reference images not displayed]

FINDINGS: Alignment: Normal

Vertebrae: Normal

Cord: Normal signal and morphology of the cord

Posterior Fossa, vertebral arteries, paraspinal tissues: Negative

Disc levels:

C2-3:  Small central disc protrusion

C3-4: Disc degeneration with diffuse uncinate spurring. No
significant stenosis.

C4-5:  Negative

C5-6: Moderate disc degeneration and spondylosis. Diffuse uncinate
spurring, left greater than right. Cord flattening and mild spinal
stenosis. Mild to moderate foraminal encroachment bilaterally, left
greater than right due to spurring

C6-7: Moderate disc degeneration and spondylosis. Diffuse uncinate
spurring bilaterally, left greater than right. Marked left foraminal
encroachment and impingement of the left C7 nerve root due to disc
protrusion and osteophyte. Mild central canal stenosis. Mild to
moderate right foraminal encroachment

C7-T1:  Negative
IMPRESSION: Disc degeneration and spondylosis at C5-6. Mild spinal stenosis and
mild to moderate foraminal encroachment bilaterally, left greater
than right

Disc degeneration and spondylosis at C6-7 with prominent uncinate
spurring left greater than right. Marked left foraminal encroachment
due to disc protrusion and osteophyte. Mild spinal stenosis.

## 2017-10-18 ENCOUNTER — Ambulatory Visit (INDEPENDENT_AMBULATORY_CARE_PROVIDER_SITE_OTHER): Payer: Medicare Other | Admitting: Urology

## 2017-10-18 ENCOUNTER — Encounter: Payer: Self-pay | Admitting: Urology

## 2017-10-18 VITALS — BP 111/71 | HR 109 | Ht 67.0 in | Wt 176.0 lb

## 2017-10-18 DIAGNOSIS — N3946 Mixed incontinence: Secondary | ICD-10-CM

## 2017-10-18 NOTE — Progress Notes (Signed)
10/18/2017 8:59 AM   Sydney Gillespie 09-25-1940 [id number]  Referring provider: [name], MD [address] [facility] [place],  [address]  Chief Complaint  Patient presents with  . Urinary Incontinence    5wk    HPI: I was consulted to assess the patient's urinary incontinence worsening in the last month. It is been present for years. She leaks with coughing sneezing bending and lifting. She has urge incontinence. Both are significant. She says her main complaint is when she goes from a sitting to standing position the urine just comes out. She wears 3 or 4 pads a day that can be soaked. She has moderately severe bedwetting.  Her flow is lessened some recently. She can have urgency and then has a little bit of trouble urinating.She voids 2 or 3 times a night and every 90 minutes during the day  Mild hypermobility of the bladder neck and a mild positive cough test with no prolapse  The patient has mixed incontinence. I think both likely are significant. I wonder if the overactive bladder component is the most significant. She does have milder to moderate frequency and nocturia. She also has moderately severe bedwetting. She likely is triggering overactivity when she goes from a sitting to standing position but she does have reasonably significant stress incontinence.If the patient ever did have her stress incontinence treated urethral injectables or sling would be reasonable  On urodynamics the patient did not void and was catheterized for 75 mL.  Bladder capacity was 220 mL.  The patient had sensory urgency.  The bladder was unstable reaching pressures of 10 cm of water associated with severe leakage.  She was triggering a lot of overactivity when she would cough.  It made assessment of stress incontinence more difficult.  At 150 mL her cough leak point pressure is 58 cm of water associated with mild to moderate leakage.  There was a lot  of triggering.  During voluntary voiding she voided 57 mL with a maximum flow of 8 mils per second and a residual was 100 mL.  Bladder neck descent at 1 cm.  The leakage was severe with her bladder overactivity.  It was moderate in severity with stress maneuvers.  In my opinion approximately 80% of the patient's bladder dysfunction is due to an overactive bladder.  Medical and behavioral therapy discussed including physical therapy.  If she does not reach her goal one would consider an outlet procedure but I would want to re-quantitate her goals and stress incontinence recognizing the refractory overactive bladder therapy may be prudent to consider instead, especially with her bedwetting and triggering with standing.  Reassess on Myrbetriq in 5 weeks  Today Patient on Myrbetriq has improved and now wears 2 pads a day instead of 3 or 4.  They are still quite wet.  The bedwetting is still present but milder.  She has had no change in frequency or nocturia.  Clinically she is not infected   PMH: Past Medical History:  Diagnosis Date  . Allergic rhinitis   . Allergy   . Anemia   . Anemia   . Barrett's esophagus   . Blood dyscrasia    multiple myloma remission  . Cancer ([place])   . Change in bowel habits   . Compression fracture 2013  . Degenerative disc disease, lumbar   . Dysrhythmia    palpitations  . Esophageal reflux   . Family history of adverse reaction to anesthesia    nausea -  mom  . GERD (gastroesophageal reflux disease)   . Gout   . 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[place])   . Heart palpitations   . Hyperlipidemia   . Hypothyroidism   . Inflammatory polyarthropathy ([place])   . Lumbago   . Lumbar radiculitis   . Lumbar stenosis with neurogenic claudication   . Multiple myeloma ([place])   . Multiple myeloma ([place])   . Osteopenia   . Osteoporosis   . Other dysphagia   . Palpitations   . Positive PPD   . Renal insufficiency   . Rheumatoid arthritis ([place])   . Rheumatoid arthritis ([place])    . Seasonal allergies   . Vertigo     Surgical History: Past Surgical History:  Procedure Laterality Date  . ABDOMINAL HYSTERECTOMY    . APPENDECTOMY    . BACK SURGERY    . BREAST EXCISIONAL BIOPSY Bilateral    neg  . CHOLECYSTECTOMY    . COLONOSCOPY WITH PROPOFOL N/A 11/12/2016   Procedure: COLONOSCOPY WITH PROPOFOL;  Surgeon: Manya Silvas, MD;  Location: [facility] ENDOSCOPY;  Service: Endoscopy;  Laterality: N/A;  . ESOPHAGOGASTRODUODENOSCOPY (EGD) WITH PROPOFOL N/A 11/12/2016   Procedure: ESOPHAGOGASTRODUODENOSCOPY (EGD) WITH PROPOFOL;  Surgeon: Manya Silvas, MD;  Location: [facility] ENDOSCOPY;  Service: Endoscopy;  Laterality: N/A;  . FOOT SURGERY    . LUMBAR LAMINECTOMY/DECOMPRESSION MICRODISCECTOMY Left 06/18/2016   Procedure: LAMINECTOMY FOR FACET/SYNOVIAL CYST LUMBAR THREE - LUMBAR FOUR LEFT;  Surgeon: Newman Pies, MD;  Location: [place];  Service: Neurosurgery;  Laterality: Left;  LAMINECTOMY FOR FACET/SYNOVIAL CYST LUMBAR THREE - LUMBAR FOUR LEFT    Home Medications:  Allergies as of 10/18/2017      Reactions   Isoniazid Other (See Comments)   Blisters    Keflex [cephalexin] Other (See Comments)   Blisters      Medication List        Accurate as of 10/18/17  8:59 AM. Always use your most recent med list.          allopurinol 300 MG tablet Commonly known as:  ZYLOPRIM Take 300 mg by mouth daily.   ALPRAZolam 0.25 MG tablet Commonly known as:  XANAX Take 0.25 mg by mouth at bedtime.   atenolol 50 MG tablet Commonly known as:  TENORMIN Take 50 mg by mouth daily.   B-12 2500 MCG Tabs Take 1 tablet by mouth daily.   cetirizine 10 MG tablet Commonly known as:  ZYRTEC Take 10 mg by mouth 2 (two) times daily.   diclofenac sodium 1 % Gel Commonly known as:  VOLTAREN Apply 2 g topically 4 (four) times daily.   escitalopram 10 MG tablet Commonly known as:  LEXAPRO Take 10 mg by mouth daily.   fexofenadine 180 MG tablet Commonly known as:  ALLEGRA Take 180  mg by mouth 2 (two) times daily.   fluticasone 50 MCG/ACT nasal spray Commonly known as:  FLONASE Place 2 sprays into the nose daily.   hydroxychloroquine 200 MG tablet Commonly known as:  PLAQUENIL TAKE 2 TABLETS BY MOUTH DAILY   levothyroxine 50 MCG tablet Commonly known as:  SYNTHROID, LEVOTHROID Take 75 mcg by mouth daily before breakfast.   mirabegron ER 50 MG Tb24 tablet Commonly known as:  MYRBETRIQ Take 1 tablet (50 mg total) by mouth daily.   pantoprazole 40 MG tablet Commonly known as:  PROTONIX Take 1 tablet (40 mg total) by mouth daily.   sodium bicarbonate 650 MG tablet TK 1 T PO BID   spironolactone 25  MG tablet Commonly known as:  ALDACTONE   traMADol 50 MG tablet Commonly known as:  ULTRAM Take 50 mg by mouth every 6 (six) hours as needed for moderate pain.   triamterene-hydrochlorothiazide 37.5-25 MG tablet Commonly known as:  MAXZIDE-25 TAKE 1 TABLET BY MOUTH DAILY AS NEEDED FOR WATER RETENTION       Allergies:  Allergies  Allergen Reactions  . Isoniazid Other (See Comments)    Blisters   . Keflex [Cephalexin] Other (See Comments)    Blisters     Family History: Family History  Problem Relation Age of Onset  . Hypertension Mother   . Heart attack Mother   . Rheum arthritis Mother   . Osteoarthritis Mother   . Cancer Father   . Hypertension Father   . Stroke Father   . Breast cancer Maternal Grandmother 67  . Osteoarthritis Other   . Rheum arthritis Other     Social History:  reports that she has never smoked. She has never used smokeless tobacco. She reports that she does not drink alcohol or use drugs.  ROS: UROLOGY Frequent Urination?: Yes Hard to postpone urination?: Yes Burning/pain with urination?: No Get up at night to urinate?: Yes Leakage of urine?: Yes Urine stream starts and stops?: No Trouble starting stream?: No Do you have to strain to urinate?: No Blood in urine?: No Urinary tract infection?: No Sexually  transmitted disease?: No Injury to kidneys or bladder?: No Painful intercourse?: No Weak stream?: No Currently pregnant?: No Vaginal bleeding?: No Last menstrual period?: n  Gastrointestinal Nausea?: No Vomiting?: No Indigestion/heartburn?: Yes Diarrhea?: No Constipation?: No  Constitutional Fever: No Night sweats?: No Weight loss?: No Fatigue?: No  Skin Skin rash/lesions?: No Itching?: No  Eyes Blurred vision?: No Double vision?: No  Ears/Nose/Throat Sore throat?: No Sinus problems?: No  Hematologic/Lymphatic Swollen glands?: No Easy bruising?: No  Cardiovascular Leg swelling?: No Chest pain?: No  Respiratory Cough?: No Shortness of breath?: No  Endocrine Excessive thirst?: No  Musculoskeletal Back pain?: No Joint pain?: Yes  Neurological Headaches?: No Dizziness?: No  Psychologic Depression?: No Anxiety?: No  Physical Exam: BP 111/71   Pulse (!) 109   Ht '5\' 7"'$  (1.702 m)   Wt 176 lb (79.8 kg)   BMI 27.57 kg/m   Constitutional:  Alert and oriented, No acute distress.   Laboratory Data: Lab Results  Component Value Date   WBC 6.1 08/30/2017   HGB 12.0 08/30/2017   HCT 35.0 08/30/2017   MCV 95.5 08/30/2017   PLT 172 08/30/2017    Lab Results  Component Value Date   CREATININE 1.97 (H) 08/30/2017    No results found for: PSA  No results found for: TESTOSTERONE  No results found for: HGBA1C  Urinalysis    Component Value Date/Time   APPEARANCEUR Clear 07/12/2017 1407   GLUCOSEU Negative 07/12/2017 1407   BILIRUBINUR Negative 07/12/2017 1407   PROTEINUR Negative 07/12/2017 1407   NITRITE Negative 07/12/2017 1407   LEUKOCYTESUR Negative 07/12/2017 1407    Pertinent Imaging:   Assessment & Plan: Would like to see her in 5 weeks on Toviaz 8 mg samples and Myrbetriq 50 mg.  This is double therapy.  I will take away the Myrbetriq if she is doing great.  I will discuss surgery versus refractory therapies versus a trial of  another antimuscarinic as add on double therapy with Myrbetriq next time.  There are no diagnoses linked to this encounter.  No follow-ups on file.  Alexandar Weisenberger A, MD  [facility] [address] [place],  [address] [phone]

## 2017-10-29 ENCOUNTER — Other Ambulatory Visit: Payer: Self-pay | Admitting: Nephrology

## 2017-10-29 DIAGNOSIS — N183 Chronic kidney disease, stage 3 unspecified: Secondary | ICD-10-CM

## 2017-10-29 DIAGNOSIS — N281 Cyst of kidney, acquired: Secondary | ICD-10-CM

## 2017-10-29 DIAGNOSIS — N179 Acute kidney failure, unspecified: Secondary | ICD-10-CM

## 2017-11-04 ENCOUNTER — Ambulatory Visit
Admission: RE | Admit: 2017-11-04 | Discharge: 2017-11-04 | Disposition: A | Discharge status: Home / Self Care | Payer: Medicare Other | Source: Ambulatory Visit | Attending: Nephrology | Admitting: Nephrology

## 2017-11-04 DIAGNOSIS — N281 Cyst of kidney, acquired: Secondary | ICD-10-CM | POA: Diagnosis not present

## 2017-11-04 DIAGNOSIS — N183 Chronic kidney disease, stage 3 unspecified: Secondary | ICD-10-CM

## 2017-11-04 DIAGNOSIS — N179 Acute kidney failure, unspecified: Secondary | ICD-10-CM | POA: Diagnosis not present

## 2017-11-22 ENCOUNTER — Ambulatory Visit (INDEPENDENT_AMBULATORY_CARE_PROVIDER_SITE_OTHER): Payer: Medicare Other | Admitting: Urology

## 2017-11-22 ENCOUNTER — Encounter: Payer: Self-pay | Admitting: Urology

## 2017-11-22 VITALS — BP 122/73 | HR 67 | Ht 67.0 in | Wt 173.0 lb

## 2017-11-22 DIAGNOSIS — N3946 Mixed incontinence: Secondary | ICD-10-CM

## 2017-11-22 MED ORDER — MIRABEGRON ER 50 MG PO TB24: 50.0000 mg | ORAL_TABLET | Freq: Every day | ORAL | 11 refills | Status: DC

## 2017-11-22 MED ORDER — FESOTERODINE FUMARATE ER 8 MG PO TB24: 8.0000 mg | ORAL_TABLET | Freq: Every day | ORAL | 11 refills | Status: DC

## 2017-11-22 NOTE — Progress Notes (Signed)
11/22/2017 8:43 AM   Sydney Gillespie 02-Sep-1940 [id number]  Referring provider: [name], MD [address] [facility] [place], [place] [address]  Chief Complaint  Patient presents with  . Urinary Incontinence    5wk    HPI: I was consulted to assess the patient's urinary incontinence worsening in the last month. It is been present for years. She leaks with coughing sneezing bending and lifting. She has urge incontinence. Both are significant. She says her main complaint is when she goes from a sitting to standing position the urine just comes out. She wears 3 or 4 pads a day that can be soaked. She has moderately severe bedwetting.  Her flow is lessened some recently. She can have urgency and then has a little bit of trouble urinating.She voids 2 or 3 times a night and every 90 minutes during the day  Mild hypermobility of the bladder neck and a mild positive cough test with no prolapse  The patient has mixed incontinence. I think both likely are significant. I wonder if the overactive bladder component is the most significant. She does have milder to moderate frequency and nocturia. She also has moderately severe bedwetting. She likely is triggering overactivity when she goes from a sitting to standing position but she does have reasonably significant stress incontinence.If the patient ever did have her stress incontinence treated urethral injectables or sling would be reasonable  On urodynamics the patient did not void and was catheterized for 75 mL. Bladder capacity was 220 mL. The patient had sensory urgency. The bladder was unstable reaching pressures of 10 cm of water associated with severe leakage. She was triggering a lot of overactivity when she would cough. It made assessment of stress incontinence more difficult. At 150 mL her cough leak point pressure is 58 cm of water associated with mild to moderate leakage. There was a lot  of triggering. During voluntary voiding she voided 57 mL with a maximum flow of 8 mils per second and a residual was 100 mL. Bladder neck descent at 1 cm. The leakage was severe with her bladder overactivity. It was moderate in severity with stress maneuvers.  In my opinion approximately 80% of the patient's bladder dysfunction is due to an overactive bladder. Medical and behavioral therapy discussed including physical therapy. If she does not reach her goal one would consider an outlet procedure but I would want to re-quantitate her goals and stress incontinence recognizing that a refractory overactive bladder therapy may be prudent to consider instead, especially with her bedwetting and triggering with standing.Reassess on Myrbetriq in 5 weeks  Patient on Myrbetriq has improved and now wears 2 pads a day instead of 3 or 4.    Would like to see her in 5 weeks on Toviaz 8 mg samples and Myrbetriq 50 mg.  This is double therapy.  I will take away the Myrbetriq if she is doing great.  I will discuss surgery versus refractory therapies versus a trial of another antimuscarinic as add on double therapy with Myrbetriq next time.  Today The patient's mixed incontinence is 80% better.  At night she is dramatically better and only wears a liner.  When the Myrbetriq samples stopped she started leaking a lot again high volume.  She is very pleased.  She can live with a stress incontinence.  Clinically not infected      PMH: Past Medical History:  Diagnosis Date  . Allergic rhinitis   . Allergy   .  Anemia   . Anemia   . Barrett's esophagus   . Blood dyscrasia    multiple myloma remission  . Cancer ([place])   . Change in bowel habits   . Compression fracture 2013  . Degenerative disc disease, lumbar   . Dysrhythmia    palpitations  . Esophageal reflux   . Family history of adverse reaction to anesthesia    nausea -mom  . GERD (gastroesophageal reflux disease)   . Gout   . H/O bone  marrow transplant ([place])   . Heart palpitations   . Hyperlipidemia   . Hypothyroidism   . Inflammatory polyarthropathy ([place])   . Lumbago   . Lumbar radiculitis   . Lumbar stenosis with neurogenic claudication   . Multiple myeloma ([place])   . Multiple myeloma ([place])   . Osteopenia   . Osteoporosis   . Other dysphagia   . Palpitations   . Positive PPD   . Renal insufficiency   . Rheumatoid arthritis ([place])   . Rheumatoid arthritis ([place])   . Seasonal allergies   . Vertigo     Surgical History: Past Surgical History:  Procedure Laterality Date  . ABDOMINAL HYSTERECTOMY    . APPENDECTOMY    . BACK SURGERY    . BREAST EXCISIONAL BIOPSY Bilateral    neg  . CHOLECYSTECTOMY    . COLONOSCOPY WITH PROPOFOL N/A 11/12/2016   Procedure: COLONOSCOPY WITH PROPOFOL;  Surgeon: Manya Silvas, MD;  Location: [facility] ENDOSCOPY;  Service: Endoscopy;  Laterality: N/A;  . ESOPHAGOGASTRODUODENOSCOPY (EGD) WITH PROPOFOL N/A 11/12/2016   Procedure: ESOPHAGOGASTRODUODENOSCOPY (EGD) WITH PROPOFOL;  Surgeon: Manya Silvas, MD;  Location: [facility] ENDOSCOPY;  Service: Endoscopy;  Laterality: N/A;  . FOOT SURGERY    . LUMBAR LAMINECTOMY/DECOMPRESSION MICRODISCECTOMY Left 06/18/2016   Procedure: LAMINECTOMY FOR FACET/SYNOVIAL CYST LUMBAR THREE - LUMBAR FOUR LEFT;  Surgeon: Newman Pies, MD;  Location: [place];  Service: Neurosurgery;  Laterality: Left;  LAMINECTOMY FOR FACET/SYNOVIAL CYST LUMBAR THREE - LUMBAR FOUR LEFT    Home Medications:  Allergies as of 11/22/2017      Reactions   Isoniazid Other (See Comments)   Blisters    Keflex [cephalexin] Other (See Comments)   Blisters      Medication List        Accurate as of 11/22/17  8:43 AM. Always use your most recent med list.          allopurinol 300 MG tablet Commonly known as:  ZYLOPRIM Take 300 mg by mouth daily.   ALPRAZolam 0.25 MG tablet Commonly known as:  XANAX Take 0.25 mg by mouth at bedtime.   atenolol 50 MG tablet Commonly known  as:  TENORMIN Take 50 mg by mouth daily.   B-12 2500 MCG Tabs Take 1 tablet by mouth daily.   cetirizine 10 MG tablet Commonly known as:  ZYRTEC Take 10 mg by mouth 2 (two) times daily.   diclofenac sodium 1 % Gel Commonly known as:  VOLTAREN Apply 2 g topically 4 (four) times daily.   escitalopram 10 MG tablet Commonly known as:  LEXAPRO Take 10 mg by mouth daily.   fexofenadine 180 MG tablet Commonly known as:  ALLEGRA Take 180 mg by mouth 2 (two) times daily.   fluticasone 50 MCG/ACT nasal spray Commonly known as:  FLONASE Place 2 sprays into the nose daily.   hydroxychloroquine 200 MG tablet Commonly known as:  PLAQUENIL TAKE 2 TABLETS BY MOUTH DAILY   levothyroxine 50 MCG tablet Commonly known  as:  SYNTHROID, LEVOTHROID Take 75 mcg by mouth daily before breakfast.   mirabegron ER 50 MG Tb24 tablet Commonly known as:  MYRBETRIQ Take 1 tablet (50 mg total) by mouth daily.   pantoprazole 40 MG tablet Commonly known as:  PROTONIX Take 1 tablet (40 mg total) by mouth daily.   sodium bicarbonate 650 MG tablet TK 1 T PO BID   spironolactone 25 MG tablet Commonly known as:  ALDACTONE   traMADol 50 MG tablet Commonly known as:  ULTRAM Take 50 mg by mouth every 6 (six) hours as needed for moderate pain.   triamterene-hydrochlorothiazide 37.5-25 MG tablet Commonly known as:  MAXZIDE-25 TAKE 1 TABLET BY MOUTH DAILY AS NEEDED FOR WATER RETENTION       Allergies:  Allergies  Allergen Reactions  . Isoniazid Other (See Comments)    Blisters   . Keflex [Cephalexin] Other (See Comments)    Blisters     Family History: Family History  Problem Relation Age of Onset  . Hypertension Mother   . Heart attack Mother   . Rheum arthritis Mother   . Osteoarthritis Mother   . Cancer Father   . Hypertension Father   . Stroke Father   . Breast cancer Maternal Grandmother 71  . Osteoarthritis Other   . Rheum arthritis Other     Social History:  reports that  she has never smoked. She has never used smokeless tobacco. She reports that she does not drink alcohol or use drugs.  ROS:                                        Physical Exam: There were no vitals taken for this visit.  Constitutional:  Alert and oriented, No acute distress.  Laboratory Data: Lab Results  Component Value Date   WBC 6.1 08/30/2017   HGB 12.0 08/30/2017   HCT 35.0 08/30/2017   MCV 95.5 08/30/2017   PLT 172 08/30/2017    Lab Results  Component Value Date   CREATININE 1.97 (H) 08/30/2017    No results found for: PSA  No results found for: TESTOSTERONE  No results found for: HGBA1C  Urinalysis    Component Value Date/Time   APPEARANCEUR Clear 07/12/2017 1407   GLUCOSEU Negative 07/12/2017 1407   BILIRUBINUR Negative 07/12/2017 1407   PROTEINUR Negative 07/12/2017 1407   NITRITE Negative 07/12/2017 1407   LEUKOCYTESUR Negative 07/12/2017 1407    Pertinent Imaging:   Assessment & Plan: Reassess durability in 4 months.  Keep patient on double therapy.  This supports a refractory OAB therapy over a sling though one would need to set up treatment goals  There are no diagnoses linked to this encounter.  No follow-ups on file.  Reece Packer, MD  [facility] [address] [place], [place] [address] [phone]

## 2017-12-09 ENCOUNTER — Telehealth: Payer: Self-pay | Admitting: Urology

## 2017-12-09 NOTE — Telephone Encounter (Signed)
Patient's insurance finally approved her Lisbeth Ply but it will cost her over $200 and she said she can't afford that. I advised her to have them look at her formulary and then let us know what they will approve that she can afford and then let us know. She said she is waiting on them to do this.    Sharyn Lull

## 2017-12-16 ENCOUNTER — Telehealth: Payer: Self-pay | Admitting: Urology

## 2017-12-16 NOTE — Telephone Encounter (Signed)
Pt stopped by office to let us know that her insurance suggested Oxybutynin Chloride 10 mg-60 tabs per month or 5 mg-120 tabs per month.  She is completely out of toviaz.

## 2017-12-16 NOTE — Telephone Encounter (Signed)
Please call pt at [phone]

## 2017-12-20 MED ORDER — OXYBUTYNIN CHLORIDE ER 10 MG PO TB24: 10.0000 mg | ORAL_TABLET | Freq: Every day | ORAL | 11 refills | Status: DC

## 2017-12-20 NOTE — Telephone Encounter (Signed)
Oxybutynin extended release 10 mg once daily 30 tablets and 11 refills

## 2018-03-07 ENCOUNTER — Inpatient Hospital Stay: Payer: Medicare Other

## 2018-03-07 ENCOUNTER — Inpatient Hospital Stay (HOSPITAL_BASED_OUTPATIENT_CLINIC_OR_DEPARTMENT_OTHER): Payer: Medicare Other | Admitting: Internal Medicine

## 2018-03-07 ENCOUNTER — Telehealth: Payer: Self-pay | Admitting: Internal Medicine

## 2018-03-07 ENCOUNTER — Other Ambulatory Visit: Payer: Self-pay

## 2018-03-07 ENCOUNTER — Inpatient Hospital Stay: Payer: Medicare Other | Attending: Internal Medicine

## 2018-03-07 ENCOUNTER — Encounter: Payer: Self-pay | Admitting: Internal Medicine

## 2018-03-07 VITALS — BP 145/75 | HR 73 | Temp 98.1°F | Resp 18 | Wt 166.0 lb

## 2018-03-07 DIAGNOSIS — R5383 Other fatigue: Secondary | ICD-10-CM

## 2018-03-07 DIAGNOSIS — N183 Chronic kidney disease, stage 3 (moderate): Secondary | ICD-10-CM

## 2018-03-07 DIAGNOSIS — C9001 Multiple myeloma in remission: Secondary | ICD-10-CM | POA: Insufficient documentation

## 2018-03-07 DIAGNOSIS — D631 Anemia in chronic kidney disease: Secondary | ICD-10-CM

## 2018-03-07 DIAGNOSIS — Z79899 Other long term (current) drug therapy: Secondary | ICD-10-CM | POA: Insufficient documentation

## 2018-03-07 LAB — IRON AND TIBC
Iron: 92 ug/dL (ref 28–170)
Saturation Ratios: 38 % — ABNORMAL HIGH (ref 10.4–31.8)
TIBC: 245 ug/dL — ABNORMAL LOW (ref 250–450)
UIBC: 153 ug/dL

## 2018-03-07 LAB — COMPREHENSIVE METABOLIC PANEL
ALT: 10 U/L (ref 0–44)
AST: 30 U/L (ref 15–41)
Albumin: 4.2 g/dL (ref 3.5–5.0)
Alkaline Phosphatase: 66 U/L (ref 38–126)
Anion gap: 9 (ref 5–15)
BUN: 42 mg/dL — ABNORMAL HIGH (ref 8–23)
CO2: 20 mmol/L — ABNORMAL LOW (ref 22–32)
Calcium: 10.1 mg/dL (ref 8.9–10.3)
Chloride: 110 mmol/L (ref 98–111)
Creatinine, Ser: 2.42 mg/dL — ABNORMAL HIGH (ref 0.44–1.00)
GFR calc Af Amer: 21 mL/min — ABNORMAL LOW (ref >60)
GFR calc non Af Amer: 18 mL/min — ABNORMAL LOW (ref >60)
Glucose, Bld: 113 mg/dL — ABNORMAL HIGH (ref 70–99)
Potassium: 4.1 mmol/L (ref 3.5–5.1)
Sodium: 139 mmol/L (ref 135–145)
Total Bilirubin: 0.4 mg/dL (ref 0.3–1.2)
Total Protein: 7.5 g/dL (ref 6.5–8.1)

## 2018-03-07 LAB — CBC WITH DIFFERENTIAL/PLATELET
Abs Immature Granulocytes: 0.01 10*3/uL (ref 0.00–0.07)
Basophils Absolute: 0.1 10*3/uL (ref 0.0–0.1)
Basophils Relative: 1 %
Eosinophils Absolute: 0.3 10*3/uL (ref 0.0–0.5)
Eosinophils Relative: 5 %
HCT: 35.4 % — ABNORMAL LOW (ref 36.0–46.0)
Hemoglobin: 11 g/dL — ABNORMAL LOW (ref 12.0–15.0)
Immature Granulocytes: 0 %
Lymphocytes Relative: 18 %
Lymphs Abs: 1.3 10*3/uL (ref 0.7–4.0)
MCH: 29.7 pg (ref 26.0–34.0)
MCHC: 31.1 g/dL (ref 30.0–36.0)
MCV: 95.7 fL (ref 80.0–100.0)
Monocytes Absolute: 0.5 10*3/uL (ref 0.1–1.0)
Monocytes Relative: 7 %
Neutro Abs: 4.8 10*3/uL (ref 1.7–7.7)
Neutrophils Relative %: 69 %
Platelets: 168 10*3/uL (ref 150–400)
RBC: 3.7 MIL/uL — ABNORMAL LOW (ref 3.87–5.11)
RDW: 13.3 % (ref 11.5–15.5)
WBC: 7 10*3/uL (ref 4.0–10.5)
nRBC: 0 % (ref 0.0–0.2)

## 2018-03-07 LAB — FERRITIN: Ferritin: 362 ng/mL — ABNORMAL HIGH (ref 11–307)

## 2018-03-07 NOTE — Assessment & Plan Note (Addendum)
#  Multiple myeloma-currently in remission. 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  Stable  # Clinically no evidence of recurrence.  April 2019-M protein absent; Lambda light chain ratio slightly elevated 2.29.  Labs today pending.  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  Await iron studies today.  # # CKD stage III- IVcreatinine- 2.4/stable [Dr.Singh]   #Cold intolerance-patient's most recent TSH approximately 3 months ago was slightly abnormal around 6; we will check repeat TSH/thyroid profile.  # follow up TBD;  Will call [phone] re: IV iron infusion.   Cc; Dr.Hande

## 2018-03-07 NOTE — Telephone Encounter (Signed)
Mychart message sent.

## 2018-03-07 NOTE — Progress Notes (Signed)
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 benign     Multiple myeloma in remission ([place])   12/18/2015 Initial Diagnosis    Multiple myeloma in remission [facility])          INTERVAL HISTORY: This is a pleasant 77 year-old female patient with above history of multiple Myeloma [status post autologous stem cell transplant May 2013 patient denies any] currently on surveillance/no maintenance therapy- Is here for follow-up.  Patient complains of cold intolerance.  Mild to moderate fatigue.  Not on p.o. iron because of intolerance.  Denies any swelling in the legs nausea vomiting.  Denies any abdominal pain.   Review of Systems  Constitutional: Positive for malaise/fatigue. Negative for diaphoresis, fever and weight loss.       Cold intolerance.  HENT: Negative for nosebleeds and sore throat.   Eyes: Negative for double vision.  Respiratory: Negative for cough, hemoptysis, sputum production, shortness of breath and wheezing.   Cardiovascular: Negative for chest pain, palpitations, orthopnea and leg swelling.  Gastrointestinal: Negative for abdominal pain, blood in stool, constipation, diarrhea, heartburn, melena, nausea and vomiting.  Genitourinary: Negative for dysuria, frequency and urgency.  Musculoskeletal: Negative for back pain and joint pain.  Skin: Negative.  Negative for itching and rash.  Neurological: Negative for dizziness, tingling, focal weakness, weakness and headaches.  Endo/Heme/Allergies: Does not bruise/bleed easily.  Psychiatric/Behavioral:  Negative for depression. The patient is not nervous/anxious and does not have insomnia.      I have reviewed the past medical history, past surgical history, social history and family history with the patient and they are unchanged from previous note unless stated above.  ALLERGIES:  is allergic to isoniazid and keflex [cephalexin].  MEDICATIONS:  Current Outpatient Medications  Medication Sig Dispense Refill  . allopurinol (ZYLOPRIM) 300 MG tablet Take 300 mg by mouth daily.     Marland Kitchen ALPRAZolam (XANAX) 0.25 MG tablet Take 0.25 mg by mouth at bedtime.   0  . atenolol (TENORMIN) 50 MG tablet Take 50 mg by mouth daily.     . cetirizine (ZYRTEC) 10 MG tablet Take 10 mg by mouth 2 (two) times daily.     . Cyanocobalamin (B-12) 2500 MCG TABS Take 1 tablet by mouth daily.    Marland Kitchen escitalopram (LEXAPRO) 10 MG tablet Take 10 mg by mouth daily.    . fexofenadine (ALLEGRA) 180 MG tablet Take 180 mg by mouth 2 (two) times daily.    . fluticasone (FLONASE) 50 MCG/ACT nasal spray Place 2 sprays into the nose daily.     . hydroxychloroquine (PLAQUENIL) 200 MG tablet TAKE 2 TABLETS BY MOUTH DAILY    . lansoprazole (PREVACID) 30 MG capsule Take 30 mg by mouth daily at 12 noon.    . leflunomide (ARAVA) 10 MG tablet TK 1 T PO QD  1  . levothyroxine (SYNTHROID, LEVOTHROID) 75 MCG tablet   5  . mirabegron ER (MYRBETRIQ) 50 MG TB24 tablet Take 1 tablet (50 mg total) by mouth daily. 30 tablet 11  . oxybutynin (DITROPAN-XL) 10 MG 24 hr  tablet Take 1 tablet (10 mg total) by mouth daily. 30 tablet 11  . spironolactone (ALDACTONE) 25 MG tablet     . triamterene-hydrochlorothiazide (MAXZIDE-25) 37.5-25 MG per tablet TAKE 1 TABLET BY MOUTH DAILY AS NEEDED FOR WATER RETENTION     No current facility-administered medications for this visit.     PHYSICAL EXAMINATION: ECOG PERFORMANCE STATUS: 0 - Asymptomatic  BP (!) 145/75 (BP Location: Right Arm, Patient Position: Sitting)   Pulse 73   Temp 98.1 F (36.7 C) (Oral)    Resp 18   Wt 166 lb (75.3 kg)   SpO2 96%   BMI 26.00 kg/m   Filed Weights   03/07/18 1343  Weight: 166 lb (75.3 kg)    Physical Exam  Constitutional: She is oriented to person, place, and time and well-developed, well-nourished, and in no distress.  HENT:  Head: Normocephalic and atraumatic.  Mouth/Throat: Oropharynx is clear and moist. No oropharyngeal exudate.  Eyes: Pupils are equal, round, and reactive to light.  Neck: Normal range of motion. Neck supple.  Cardiovascular: Normal rate and regular rhythm.  Pulmonary/Chest: No respiratory distress. She has no wheezes.  Abdominal: Soft. Bowel sounds are normal. She exhibits no distension and no mass. There is no tenderness. There is no rebound and no guarding.  Musculoskeletal: Normal range of motion. She exhibits no edema or tenderness.  Neurological: She is alert and oriented to person, place, and time.  Skin: Skin is warm.  Psychiatric: Affect normal.    LABORATORY DATA:  I have reviewed the data as listed    Component Value Date/Time   NA 139 03/07/2018 1258   NA 142 08/16/2013 1028   K 4.1 03/07/2018 1258   K 4.5 08/16/2013 1028   CL 110 03/07/2018 1258   CL 106 08/16/2013 1028   CO2 20 (L) 03/07/2018 1258   CO2 27 08/16/2013 1028   GLUCOSE 113 (H) 03/07/2018 1258   GLUCOSE 99 08/16/2013 1028   BUN 42 (H) 03/07/2018 1258   BUN 21 (H) 08/16/2013 1028   CREATININE 2.42 (H) 03/07/2018 1258   CREATININE 1.42 (H) 09/07/2014 0954   CALCIUM 10.1 03/07/2018 1258   CALCIUM 9.3 09/07/2014 0954   PROT 7.5 03/07/2018 1258   PROT 7.4 08/16/2013 1028   ALBUMIN 4.2 03/07/2018 1258   ALBUMIN 3.9 08/16/2013 1028   AST 30 03/07/2018 1258   AST 27 08/16/2013 1028   ALT 10 03/07/2018 1258   ALT 15 08/16/2013 1028   ALKPHOS 66 03/07/2018 1258   ALKPHOS 61 08/16/2013 1028   BILITOT 0.4 03/07/2018 1258   BILITOT 0.3 08/16/2013 1028   GFRNONAA 18 (L) 03/07/2018 1258   GFRNONAA 36 (L) 09/07/2014 0954   GFRAA 21 (L)  03/07/2018 1258   GFRAA 42 (L) 09/07/2014 0954    No results found for: SPEP, UPEP  Lab Results  Component Value Date   WBC 7.0 03/07/2018   NEUTROABS 4.8 03/07/2018   HGB 11.0 (L) 03/07/2018   HCT 35.4 (L) 03/07/2018   MCV 95.7 03/07/2018   PLT 168 03/07/2018      Chemistry      Component Value Date/Time   NA 139 03/07/2018 1258   NA 142 08/16/2013 1028   K 4.1 03/07/2018 1258   K 4.5 08/16/2013 1028   CL 110 03/07/2018 1258   CL 106 08/16/2013 1028   CO2 20 (L) 03/07/2018 1258   CO2 27 08/16/2013 1028   BUN 42 (H) 03/07/2018 1258   BUN 21 (H) 08/16/2013  1028   CREATININE 2.42 (H) 03/07/2018 1258   CREATININE 1.42 (H) 09/07/2014 0954      Component Value Date/Time   CALCIUM 10.1 03/07/2018 1258   CALCIUM 9.3 09/07/2014 0954   ALKPHOS 66 03/07/2018 1258   ALKPHOS 61 08/16/2013 1028   AST 30 03/07/2018 1258   AST 27 08/16/2013 1028   ALT 10 03/07/2018 1258   ALT 15 08/16/2013 1028   BILITOT 0.4 03/07/2018 1258   BILITOT 0.3 08/16/2013 1028       RADIOGRAPHIC STUDIES: I have personally reviewed the radiological images as listed and agreed with the findings in the report. No results found.   ASSESSMENT & PLAN:    Multiple myeloma in remission [facility]) # Multiple myeloma-currently in remission. 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  Stable  # Clinically no evidence of recurrence.  April 2019-M protein absent; Lambda light chain ratio slightly elevated 2.29.  Labs today pending.  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  Await iron studies today.  # # CKD stage III- IVcreatinine- 2.4/stable [Dr.Singh]   #Cold intolerance-patient's most recent TSH approximately 3 months ago was slightly abnormal around 6; we will check repeat TSH/thyroid profile.  # follow up TBD;  Will call [phone] re: IV iron infusion.   Cc; Dr.Hande       Cammie Sickle, MD 03/07/2018 5:10 PM

## 2018-03-08 LAB — KAPPA/LAMBDA LIGHT CHAINS
Kappa free light chain: 46 mg/L — ABNORMAL HIGH (ref 3.3–19.4)
Kappa, lambda light chain ratio: 2.31 — ABNORMAL HIGH (ref 0.26–1.65)
Lambda free light chains: 19.9 mg/L (ref 5.7–26.3)

## 2018-03-08 LAB — THYROID PANEL WITH TSH
Free Thyroxine Index: 1.9 (ref 1.2–4.9)
T3 Uptake Ratio: 26 % (ref 24–39)
T4, Total: 7.3 ug/dL (ref 4.5–12.0)
TSH: 0.908 u[IU]/mL (ref 0.450–4.500)

## 2018-03-10 LAB — MULTIPLE MYELOMA PANEL, SERUM
Albumin SerPl Elph-Mcnc: 3.8 g/dL (ref 2.9–4.4)
Albumin/Glob SerPl: 1.5 (ref 0.7–1.7)
Alpha 1: 0.2 g/dL (ref 0.0–0.4)
Alpha2 Glob SerPl Elph-Mcnc: 1 g/dL (ref 0.4–1.0)
B-Globulin SerPl Elph-Mcnc: 0.8 g/dL (ref 0.7–1.3)
Gamma Glob SerPl Elph-Mcnc: 0.7 g/dL (ref 0.4–1.8)
Globulin, Total: 2.7 g/dL (ref 2.2–3.9)
IgA: 55 mg/dL — ABNORMAL LOW (ref 64–422)
IgG (Immunoglobin G), Serum: 854 mg/dL (ref 700–1600)
IgM (Immunoglobulin M), Srm: 14 mg/dL — ABNORMAL LOW (ref 26–217)
Total Protein ELP: 6.5 g/dL (ref 6.0–8.5)

## 2018-03-11 ENCOUNTER — Telehealth: Payer: Self-pay | Admitting: Internal Medicine

## 2018-03-11 NOTE — Telephone Encounter (Signed)
My chart message sent to pt.  #Sydney Gillespie/Sydney Gillespie-please schedule follow-up in 6 months [order CBC CMP myeloma panel kappa lambda light chain].  Thanks GB

## 2018-03-28 ENCOUNTER — Ambulatory Visit (INDEPENDENT_AMBULATORY_CARE_PROVIDER_SITE_OTHER): Payer: Medicare Other | Admitting: Urology

## 2018-03-28 ENCOUNTER — Encounter: Payer: Self-pay | Admitting: Urology

## 2018-03-28 VITALS — BP 94/60 | HR 83 | Ht 67.0 in | Wt 168.0 lb

## 2018-03-28 DIAGNOSIS — N3946 Mixed incontinence: Secondary | ICD-10-CM

## 2018-03-28 MED ORDER — MIRABEGRON ER 50 MG PO TB24: 50.0000 mg | ORAL_TABLET | Freq: Every day | ORAL | 11 refills | Status: DC

## 2018-03-28 MED ORDER — OXYBUTYNIN CHLORIDE ER 10 MG PO TB24: 10.0000 mg | ORAL_TABLET | Freq: Every day | ORAL | 11 refills | Status: DC

## 2018-03-28 NOTE — Addendum Note (Signed)
Addended by: Kyra Manges on: 03/28/2018 09:14 AM   Modules accepted: Orders

## 2018-03-28 NOTE — Progress Notes (Signed)
03/28/2018 8:56 AM   Sydney Gillespie [birth year] [id number]  Referring provider: [name], MD [address] [facility] [place], [place] [address]  Chief Complaint  Patient presents with  . Urinary Incontinence    HPI: I was consulted to assess the patient's urinary incontinence worsening in the last month. It is been present for years. She leaks with coughing sneezing bending and lifting. She has urge incontinence. Both are significant. She says her main complaint is when she goes from a sitting to standing position the urine just comes out. She wears 3 or 4 pads a day that can be soaked. She has moderately severe bedwetting.  Mild hypermobility of the bladder neck and a mild positive cough test with no prolapse  The patient has mixed incontinence. I think both likely are significant. I wonder if the overactive bladder component is the most significant. She does have milder to moderate frequency and nocturia. She also has moderately severe bedwetting. She likely is triggering overactivity when she goes from a sitting to standing position but she does have reasonably significant stress incontinence.If the patient ever did have her stress incontinence treated urethral injectables or sling would be reasonable  On urodynamics the patient did not void and was catheterized for 75 mL. Bladder capacity was 220 mL. The patient hadsensoryurgency. The bladder was unstable reaching pressures of 10 cm of water associated with severe leakage. She was triggering a lot of overactivity when she would cough. It made assessment of stress incontinence more difficult.At150 mL her cough leak point pressure is 58 cm of water associated with mild to moderate leakage. There was a lot of triggering. During voluntary voiding she voided 57 mL with a maximum flow of 8 mils per second and a residual was 100 mL. Bladder neck descent at 1 cm. The leakage was severe with  her bladder overactivity. It was moderate in severity with stress maneuvers.  In my opinion approximately 80% of the patient's bladder dysfunction is due to an overactive bladder. Medical and behavioral therapy discussed including physical therapy. If she does not reach her goal one would consider an outlet procedure but I would want to re-quantitate her goals and stress incontinence recognizing that a refractory overactive bladder therapy may be prudent to consider instead, especially with her bedwetting and triggering with standing.Reassess on Myrbetriq in 5 weeks  Patient on Myrbetriq has improved and now wears 2 pads a day instead of 3 or 4.   Would like to see her in 5 weeks on Toviaz 8 mg samples and Myrbetriq 50 mg. This is double therapy. I will take away the Myrbetriq if she is doing great. I will discuss surgery versus refractory therapies versus a trial of another antimuscarinic as add on double therapy with Myrbetriqnext time.  The patient's mixed incontinence is 80% better.  At night she is dramatically better and only wears a liner.  When the Myrbetriq samples stopped she started leaking a lot again high volume.  She is very pleased.  She can live with a stress incontinence.   Today Reassess durability in 4 months.  Keep patient on double therapy.  This supports a refractory OAB therapy over a sling though one would need to set up treatment goals  Day Frequency stable.  Clinically not infected.  Patient still leaks with activity and there was one high-volume episode this is uncommon.  She said the stress incontinence is worse in the residual urge incontinence.  She still wears 1 and  sometimes 2 pads a day and agrees overall for her mixed incontinence she is about 80% better and reasonably pleased.  She was wondering what a sling in detail.  I drew her a picture.  I went through my entire template in detail.  We talked about mesh issues.  We talked about overactive bladder  concerns with persistent or worsening symptoms.    PMH: Past Medical History:  Diagnosis Date  . Allergic rhinitis   . Allergy   . Anemia   . Anemia   . Barrett's esophagus   . Blood dyscrasia    multiple myloma remission  . Cancer ([place])   . Change in bowel habits   . Compression fracture 2013  . Degenerative disc disease, lumbar   . Dysrhythmia    palpitations  . Esophageal reflux   . Family history of adverse reaction to anesthesia    nausea -mom  . GERD (gastroesophageal reflux disease)   . Gout   . 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[place])   . Heart palpitations   . Hyperlipidemia   . Hypothyroidism   . Inflammatory polyarthropathy ([place])   . Lumbago   . Lumbar radiculitis   . Lumbar stenosis with neurogenic claudication   . Multiple myeloma ([place])   . Multiple myeloma ([place])   . Osteopenia   . Osteoporosis   . Other dysphagia   . Palpitations   . Positive PPD   . Renal insufficiency   . Rheumatoid arthritis ([place])   . Rheumatoid arthritis ([place])   . Seasonal allergies   . Vertigo     Surgical History: Past Surgical History:  Procedure Laterality Date  . ABDOMINAL HYSTERECTOMY    . APPENDECTOMY    . BACK SURGERY    . BREAST EXCISIONAL BIOPSY Bilateral    neg  . CHOLECYSTECTOMY    . COLONOSCOPY WITH PROPOFOL N/A 11/12/2016   Procedure: COLONOSCOPY WITH PROPOFOL;  Surgeon: Manya Silvas, MD;  Location: [facility] ENDOSCOPY;  Service: Endoscopy;  Laterality: N/A;  . ESOPHAGOGASTRODUODENOSCOPY (EGD) WITH PROPOFOL N/A 11/12/2016   Procedure: ESOPHAGOGASTRODUODENOSCOPY (EGD) WITH PROPOFOL;  Surgeon: Manya Silvas, MD;  Location: [facility] ENDOSCOPY;  Service: Endoscopy;  Laterality: N/A;  . FOOT SURGERY    . LUMBAR LAMINECTOMY/DECOMPRESSION MICRODISCECTOMY Left 06/18/2016   Procedure: LAMINECTOMY FOR FACET/SYNOVIAL CYST LUMBAR THREE - LUMBAR FOUR LEFT;  Surgeon: Newman Pies, MD;  Location: [facility];  Service: Neurosurgery;  Laterality: Left;  LAMINECTOMY FOR FACET/SYNOVIAL CYST  LUMBAR THREE - LUMBAR FOUR LEFT    Home Medications:  Allergies as of 03/28/2018      Reactions   Isoniazid Other (See Comments)   Blisters    Keflex [cephalexin] Other (See Comments)   Blisters      Medication List        Accurate as of 03/28/18  8:56 AM. Always use your most recent med list.          allopurinol 300 MG tablet Commonly known as:  ZYLOPRIM Take 300 mg by mouth daily.   ALPRAZolam 0.25 MG tablet Commonly known as:  XANAX Take 0.25 mg by mouth at bedtime.   atenolol 50 MG tablet Commonly known as:  TENORMIN Take 50 mg by mouth daily.   B-12 2500 MCG Tabs Take 1 tablet by mouth daily.   cetirizine 10 MG tablet Commonly known as:  ZYRTEC Take 10 mg by mouth 2 (two) times daily.   fexofenadine 180 MG tablet Commonly known as:  ALLEGRA Take 180 mg by mouth 2 (two)  times daily.   fluticasone 50 MCG/ACT nasal spray Commonly known as:  FLONASE Place 2 sprays into the nose daily.   hydroxychloroquine 200 MG tablet Commonly known as:  PLAQUENIL TAKE 2 TABLETS BY MOUTH DAILY   lansoprazole 30 MG capsule Commonly known as:  PREVACID Take 30 mg by mouth daily at 12 noon.   leflunomide 10 MG tablet Commonly known as:  ARAVA TK 1 T PO QD   levothyroxine 75 MCG tablet Commonly known as:  SYNTHROID, LEVOTHROID   mirabegron ER 50 MG Tb24 tablet Commonly known as:  MYRBETRIQ Take 1 tablet (50 mg total) by mouth daily.   oxybutynin 10 MG 24 hr tablet Commonly known as:  DITROPAN-XL Take 1 tablet (10 mg total) by mouth daily.   spironolactone 25 MG tablet Commonly known as:  ALDACTONE   triamterene-hydrochlorothiazide 37.5-25 MG tablet Commonly known as:  MAXZIDE-25 TAKE 1 TABLET BY MOUTH DAILY AS NEEDED FOR WATER RETENTION       Allergies:  Allergies  Allergen Reactions  . Isoniazid Other (See Comments)    Blisters   . Keflex [Cephalexin] Other (See Comments)    Blisters     Family History: Family History  Problem Relation Age of  Onset  . Hypertension Mother   . Heart attack Mother   . Rheum arthritis Mother   . Osteoarthritis Mother   . Cancer Father   . Hypertension Father   . Stroke Father   . Breast cancer Maternal Grandmother 53  . Osteoarthritis Other   . Rheum arthritis Other     Social History:  reports that she has never smoked. She has never used smokeless tobacco. She reports that she does not drink alcohol or use drugs.  ROS: UROLOGY Frequent Urination?: No Hard to postpone urination?: No Burning/pain with urination?: No Get up at night to urinate?: Yes Leakage of urine?: Yes Urine stream starts and stops?: No Trouble starting stream?: No Do you have to strain to urinate?: No Blood in urine?: No Urinary tract infection?: No Sexually transmitted disease?: No Injury to kidneys or bladder?: No Painful intercourse?: No Weak stream?: No Currently pregnant?: No Vaginal bleeding?: No Last menstrual period?: n  Gastrointestinal Nausea?: No Vomiting?: No Indigestion/heartburn?: No Diarrhea?: No Constipation?: No  Constitutional Fever: No Night sweats?: No Weight loss?: No Fatigue?: No  Skin Skin rash/lesions?: No Itching?: No  Eyes Blurred vision?: No Double vision?: No  Ears/Nose/Throat Sore throat?: No Sinus problems?: No  Hematologic/Lymphatic Swollen glands?: No Easy bruising?: No  Cardiovascular Leg swelling?: No Chest pain?: No  Respiratory Cough?: No Shortness of breath?: No  Endocrine Excessive thirst?: Yes  Musculoskeletal Back pain?: No Joint pain?: No  Neurological Headaches?: No Dizziness?: No  Psychologic Depression?: No Anxiety?: No  Physical Exam: BP 94/60 (BP Location: Left Arm, Patient Position: Sitting, Cuff Size: Normal)   Pulse 83   Ht '5\' 7"'  (1.702 m)   Wt 76.2 kg   BMI 26.31 kg/m   Constitutional:  Alert and oriented, No acute distress.   Laboratory Data: Lab Results  Component Value Date   WBC 7.0 03/07/2018   HGB  11.0 (L) 03/07/2018   HCT 35.4 (L) 03/07/2018   MCV 95.7 03/07/2018   PLT 168 03/07/2018    Lab Results  Component Value Date   CREATININE 2.42 (H) 03/07/2018    No results found for: PSA  No results found for: TESTOSTERONE  No results found for: HGBA1C  Urinalysis    Component Value Date/Time   APPEARANCEUR  Clear 07/12/2017 1407   GLUCOSEU Negative 07/12/2017 1407   BILIRUBINUR Negative 07/12/2017 1407   PROTEINUR Negative 07/12/2017 1407   NITRITE Negative 07/12/2017 1407   LEUKOCYTESUR Negative 07/12/2017 1407    Pertinent Imaging:   Assessment & Plan: Mixed incontinence.  Patient now on oxybutynin and Myrbetriq.  I will see her in 1 year.  She will call if she like to schedule surgery.  She is leaning towards not doing surgery  There are no diagnoses linked to this encounter.  No follow-ups on file.  Reece Packer, MD  [facility] [address] [place], [place] [address] [phone]

## 2018-04-12 ENCOUNTER — Ambulatory Visit
Admission: EM | Admit: 2018-04-12 | Discharge: 2018-04-12 | Disposition: A | Discharge status: Home / Self Care | Payer: Medicare Other | Attending: Family Medicine | Admitting: Family Medicine

## 2018-04-12 ENCOUNTER — Other Ambulatory Visit: Payer: Self-pay

## 2018-04-12 ENCOUNTER — Encounter: Payer: Self-pay | Admitting: Emergency Medicine

## 2018-04-12 ENCOUNTER — Ambulatory Visit: Payer: Medicare Other

## 2018-04-12 DIAGNOSIS — D631 Anemia in chronic kidney disease: Secondary | ICD-10-CM | POA: Diagnosis not present

## 2018-04-12 DIAGNOSIS — N183 Chronic kidney disease, stage 3 (moderate): Secondary | ICD-10-CM | POA: Insufficient documentation

## 2018-04-12 DIAGNOSIS — Z79899 Other long term (current) drug therapy: Secondary | ICD-10-CM | POA: Diagnosis not present

## 2018-04-12 DIAGNOSIS — M109 Gout, unspecified: Secondary | ICD-10-CM | POA: Insufficient documentation

## 2018-04-12 DIAGNOSIS — M069 Rheumatoid arthritis, unspecified: Secondary | ICD-10-CM | POA: Diagnosis not present

## 2018-04-12 DIAGNOSIS — Z881 Allergy status to other antibiotic agents status: Secondary | ICD-10-CM | POA: Insufficient documentation

## 2018-04-12 DIAGNOSIS — E039 Hypothyroidism, unspecified: Secondary | ICD-10-CM | POA: Diagnosis not present

## 2018-04-12 DIAGNOSIS — R05 Cough: Secondary | ICD-10-CM | POA: Diagnosis present

## 2018-04-12 DIAGNOSIS — J209 Acute bronchitis, unspecified: Secondary | ICD-10-CM | POA: Diagnosis not present

## 2018-04-12 DIAGNOSIS — I700 Atherosclerosis of aorta: Secondary | ICD-10-CM | POA: Insufficient documentation

## 2018-04-12 DIAGNOSIS — E785 Hyperlipidemia, unspecified: Secondary | ICD-10-CM | POA: Diagnosis not present

## 2018-04-12 DIAGNOSIS — K219 Gastro-esophageal reflux disease without esophagitis: Secondary | ICD-10-CM | POA: Insufficient documentation

## 2018-04-12 DIAGNOSIS — Z79890 Hormone replacement therapy (postmenopausal): Secondary | ICD-10-CM | POA: Insufficient documentation

## 2018-04-12 DIAGNOSIS — J40 Bronchitis, not specified as acute or chronic: Secondary | ICD-10-CM

## 2018-04-12 MED ORDER — HYDROCOD POLST-CPM POLST ER 10-8 MG/5ML PO SUER: 5.0000 mL | Freq: Every evening | ORAL | 0 refills | Status: DC | PRN

## 2018-04-12 MED ORDER — PREDNISONE 50 MG PO TABS: ORAL_TABLET | ORAL | 0 refills | Status: DC

## 2018-04-12 MED ORDER — AZITHROMYCIN 250 MG PO TABS: ORAL_TABLET | ORAL | 0 refills | Status: DC

## 2018-04-12 NOTE — ED Provider Notes (Signed)
[facility]    CSN: [id number] Arrival date & time: 04/12/18  1130  History   Chief Complaint Chief Complaint  Patient presents with  . Cough   HPI  77 year old female presents with cough.  5-day history of cough.  Worsening.  Productive.  Has had low-grade fever.  Most recent fever was 100.4.  She has used Vicks and cough syrup without resolution.  Tessalon Perles for daytime.  Appropriate precautions reports at night.  Interfering with sleep.  No known relieving factors.  No other associated symptoms.  No other complaints.  PMH, Surgical Hx, Family Hx, Social History reviewed and updated as below.  Past Medical History:  Diagnosis Date  . Allergic rhinitis   . Allergy   . Anemia   . Anemia   . Barrett's esophagus   . Blood dyscrasia    multiple myloma remission  . Cancer ([place])   . Change in bowel habits   . Compression fracture 2013  . Degenerative disc disease, lumbar   . Dysrhythmia    palpitations  . Esophageal reflux   . Family history of adverse reaction to anesthesia    nausea -mom  . GERD (gastroesophageal reflux disease)   . Gout   . 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[place])   . Heart palpitations   . Hyperlipidemia   . Hypothyroidism   . Inflammatory polyarthropathy ([place])   . Lumbago   . Lumbar radiculitis   . Lumbar stenosis with neurogenic claudication   . Multiple myeloma ([place])   . Multiple myeloma ([place])   . Osteopenia   . Osteoporosis   . Other dysphagia   . Palpitations   . Positive PPD   . Renal insufficiency   . Rheumatoid arthritis ([place])   . Rheumatoid arthritis ([place])   . Seasonal allergies   . Vertigo     Patient Active Problem List   Diagnosis Date Noted  . Anemia due to stage 3 chronic kidney disease ([place]) 12/28/2016  . Lumbar radiculopathy 06/18/2016  . Fatigue 01/29/2016  . Low serum vitamin D 01/29/2016  . Multiple myeloma in remission ([place]) 12/18/2015  . Spondylolisthesis of lumbar region 11/25/2015  . Soft tissue  lesion of shoulder region 09/11/2015  . Acid reflux 09/11/2015  . Rheumatoid arthritis involving multiple joints ([place]) 09/11/2015  . 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 Degeneration of intervertebral disc of lumbar region 10/18/2013  . Neuritis or radiculitis due to rupture of lumbar intervertebral disc 10/18/2013  . Degenerative arthritis of lumbar spine 10/18/2013  . Allergic rhinitis 09/27/2013  . HLD (hyperlipidemia) 09/27/2013  . Awareness of heartbeats 09/27/2013  . Gout 09/21/2013  . Abnormal result of Mantoux test 09/21/2013  . Impaired renal function 09/21/2013  . 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  . OP (osteoporosis) 10/13/2011  . 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 Rheumatoid arthritis ([place]) 09/23/2011    Past Surgical History:  Procedure Laterality Date  . ABDOMINAL HYSTERECTOMY    . APPENDECTOMY    . BACK SURGERY    . BREAST EXCISIONAL BIOPSY Bilateral    neg  . CHOLECYSTECTOMY    . COLONOSCOPY WITH PROPOFOL N/A 11/12/2016   Procedure: COLONOSCOPY WITH PROPOFOL;  Surgeon: Manya Silvas, MD;  Location: [facility] ENDOSCOPY;  Service: Endoscopy;  Laterality: N/A;  . ESOPHAGOGASTRODUODENOSCOPY (EGD) WITH PROPOFOL N/A 11/12/2016   Procedure: ESOPHAGOGASTRODUODENOSCOPY (EGD) WITH PROPOFOL;  Surgeon: Manya Silvas, MD;  Location: [facility] ENDOSCOPY;  Service: Endoscopy;  Laterality: N/A;  . FOOT SURGERY    .  LUMBAR LAMINECTOMY/DECOMPRESSION MICRODISCECTOMY Left 06/18/2016   Procedure: LAMINECTOMY FOR FACET/SYNOVIAL CYST LUMBAR THREE - LUMBAR FOUR LEFT;  Surgeon: Newman Pies, MD;  Location: [place] [facility];  Service: Neurosurgery;  Laterality: Left;  LAMINECTOMY FOR FACET/SYNOVIAL CYST LUMBAR THREE - LUMBAR FOUR LEFT    OB History   None      Home Medications    Prior to Admission medications   Medication Sig Start Date End Date Taking? Authorizing Provider  allopurinol (ZYLOPRIM) 300 MG tablet Take 300 mg by mouth daily.  09/14/14  Yes [provider]  ALPRAZolam (XANAX) 0.25 MG tablet Take 0.25 mg by mouth at bedtime.  10/18/15  Yes [provider]  atenolol (TENORMIN) 50 MG tablet Take 50 mg by mouth daily.  10/17/13  Yes [provider]  cetirizine (ZYRTEC) 10 MG tablet Take 10 mg by mouth 2 (two) times daily.    Yes [provider]  Cyanocobalamin (B-12) 2500 MCG TABS Take 1 tablet by mouth daily.   Yes [provider]  fexofenadine (ALLEGRA) 180 MG tablet Take 180 mg by mouth 2 (two) times daily.   Yes [provider]  fluticasone (FLONASE) 50 MCG/ACT nasal spray Place 2 sprays into the nose daily.    Yes [provider]  hydroxychloroquine (PLAQUENIL) 200 MG tablet TAKE 2 TABLETS BY MOUTH DAILY 08/20/14  Yes [provider]  lansoprazole (PREVACID) 30 MG capsule Take 30 mg by mouth daily at 12 noon.   Yes [provider]  leflunomide (ARAVA) 10 MG tablet TK 1 T PO QD 11/17/17  Yes [provider]  levothyroxine (SYNTHROID, LEVOTHROID) 75 MCG tablet  11/02/17  Yes [provider]  mirabegron ER (MYRBETRIQ) 50 MG TB24 tablet Take 1 tablet (50 mg total) by mouth daily. 03/28/18  Yes MacDiarmid, Nicki Reaper, MD  oxybutynin (DITROPAN-XL) 10 MG 24 hr tablet Take 1 tablet (10 mg total) by mouth daily. 03/28/18  Yes MacDiarmid, Nicki Reaper, MD  spironolactone (ALDACTONE) 25 MG tablet  07/10/17  Yes [provider]  triamterene-hydrochlorothiazide (MAXZIDE-25) 37.5-25 MG per tablet TAKE 1 TABLET BY MOUTH DAILY AS NEEDED FOR WATER RETENTION   Yes [provider]  azithromycin (ZITHROMAX) 250 MG tablet 2 tablets on day 1, then 1 tablet daily on days 2-5. 04/12/18   Coral Spikes, DO  chlorpheniramine-HYDROcodone (TUSSIONEX PENNKINETIC ER) 10-8 MG/5ML SUER Take 5 mLs by mouth at bedtime as needed. 04/12/18   Coral Spikes, DO  predniSONE (DELTASONE) 50 MG tablet 1 tablet daily x 5 days 04/12/18   Coral Spikes, DO    Family History Family History    Problem Relation Age of Onset  . Hypertension Mother   . Heart attack Mother   . Rheum arthritis Mother   . Osteoarthritis Mother   . Cancer Father   . Hypertension Father   . Stroke Father   . Breast cancer Maternal Grandmother 24  . Osteoarthritis Other   . Rheum arthritis Other     Social History Social History   Tobacco Use  . Smoking status: Never Smoker  . Smokeless tobacco: Never Used  Substance Use Topics  . Alcohol use: No  . Drug use: No   Allergies   Isoniazid and Keflex [cephalexin]  Review of Systems Review of Systems  Constitutional: Positive for fever.  Respiratory: Positive for cough.    Physical Exam Triage Vital Signs ED Triage Vitals  Enc Vitals Group     BP 04/12/18 1152 93/60     Pulse  Rate 04/12/18 1152 93     Resp 04/12/18 1152 18     Temp 04/12/18 1152 98.6 F (37 C)     Temp Source 04/12/18 1152 Oral     SpO2 04/12/18 1152 100 %     Weight 04/12/18 1151 160 lb (72.6 kg)     Height 04/12/18 1151 '5\' 7"'  (1.702 m)     Head Circumference --      Peak Flow --      Pain Score 04/12/18 1151 0     Pain Loc --      Pain Edu? --      Excl. in [place]? --    Updated Vital Signs BP 93/60 (BP Location: Left Arm)   Pulse 93   Temp 98.6 F (37 C) (Oral)   Resp 18   Ht '5\' 7"'  (1.702 m)   Wt 72.6 kg   SpO2 100%   BMI 25.06 kg/m   Visual Acuity Right Eye Distance:   Left Eye Distance:   Bilateral Distance:    Right Eye Near:   Left Eye Near:    Bilateral Near:     Physical Exam  Constitutional: She is oriented to person, place, and time. She appears well-developed.  Appears ill.  HENT:  Head: Normocephalic and atraumatic.  Cardiovascular: Normal rate and regular rhythm.  Pulmonary/Chest: Effort normal.  Very coarse sounds throughout and rhonchorous.  Neurological: She is alert and oriented to person, place, and time.  Psychiatric: She has a normal mood and affect. Her behavior is normal.  Nursing note and vitals reviewed.  UC  Treatments / Results  Labs (all labs ordered are listed, but only abnormal results are displayed) Labs Reviewed - No data to display  EKG None  Radiology Dg Chest 2 View  Result Date: 04/12/2018 CLINICAL DATA:  Cough and fever for 5 days. EXAM: CHEST - 2 VIEW COMPARISON:  06/11/2010 FINDINGS: Hyperinflation. Lower cervical spine fixation. Lower thoracic vertebral body compression deformity is mild. Osteopenia. Midline trachea. Normal heart size. Atherosclerosis in the transverse aorta. No pleural effusion or pneumothorax. Clear lungs. Cholecystectomy clips. IMPRESSION: No acute cardiopulmonary disease. Aortic Atherosclerosis (ICD10-I70.0). Electronically Signed   By: Abigail Miyamoto M.D.   On: 04/12/2018 12:43    Procedures Procedures (including critical care time)  Medications Ordered in UC Medications - No data to display  Initial Impression / Assessment and Plan / UC Course  I have reviewed the triage vital signs and the nursing notes.  Pertinent labs & imaging results that were available during my care of the patient were reviewed by me and considered in my medical decision making (see chart for details).    77 year old female presents with severe cough and fever.  X-ray was negative for acute findings.  However, clinically patient appears ill.  Placing on empiric antibiotic therapy and prednisone.  Tussionex for cough.  Final Clinical Impressions(s) / UC Diagnoses   Final diagnoses:  Acute bronchitis, unspecified organism     Discharge Instructions     Medications as prescribed.  Take care  Dr. Lacinda Axon    ED Prescriptions    Medication Sig Dispense Auth. Provider   azithromycin (ZITHROMAX) 250 MG tablet 2 tablets on day 1, then 1 tablet daily on days 2-5. 6 tablet Palmer Fahrner G, DO   predniSONE (DELTASONE) 50 MG tablet 1 tablet daily x 5 days 5 tablet Emani Morad G, DO   chlorpheniramine-HYDROcodone (TUSSIONEX PENNKINETIC ER) 10-8 MG/5ML SUER Take 5 mLs by mouth at  bedtime  as needed. 60 mL Coral Spikes, DO     Controlled Substance Prescriptions [place] Controlled Substance Registry consulted? Not Applicable   Coral Spikes, DO 04/12/18 8413

## 2018-04-12 NOTE — ED Triage Notes (Signed)
Patient in today c/o cough x 5 days worsening and has become productive since last night. Patient has had a fever of 100.8-101. Patient has not taken any fever reducer, but has tried Vicks cough syrup and peppermint schnapps.

## 2018-04-12 NOTE — Discharge Instructions (Signed)
Medications as prescribed. ° °Take care ° °Dr. Rien Marland  °

## 2018-05-20 ENCOUNTER — Telehealth: Payer: Self-pay | Admitting: Urology

## 2018-05-20 NOTE — Telephone Encounter (Signed)
Pharmacy called to inform the medication Myrbetriq is too expensive for pt. Please advise. Thanks.

## 2018-05-23 NOTE — Telephone Encounter (Signed)
Patient states she is currently on Oxybutynin and was taking Myrbetriq also. She is continuing Oxybutynin but wants to know if there is another medication to try

## 2018-05-23 NOTE — Telephone Encounter (Signed)
Patient can try oxybutynin ER 10 mg or Detrol LA 4 mg unless she is already had and failed these

## 2018-05-24 MED ORDER — TOLTERODINE TARTRATE ER 4 MG PO CP24: 4.0000 mg | ORAL_CAPSULE | Freq: Every day | ORAL | 11 refills | Status: DC

## 2018-05-24 NOTE — Telephone Encounter (Signed)
Patient notified and Rx was sent

## 2018-05-24 NOTE — Telephone Encounter (Signed)
She can add detrol 4 mg to oxy thanks

## 2018-06-07 ENCOUNTER — Other Ambulatory Visit: Payer: Self-pay

## 2018-06-07 ENCOUNTER — Ambulatory Visit: Payer: Medicare Other

## 2018-06-07 ENCOUNTER — Ambulatory Visit
Admission: EM | Admit: 2018-06-07 | Discharge: 2018-06-07 | Disposition: A | Discharge status: Home / Self Care | Payer: Medicare Other | Attending: Family Medicine | Admitting: Family Medicine

## 2018-06-07 DIAGNOSIS — W000XXA Fall on same level due to ice and snow, initial encounter: Secondary | ICD-10-CM

## 2018-06-07 DIAGNOSIS — Y92009 Unspecified place in unspecified non-institutional (private) residence as the place of occurrence of the external cause: Secondary | ICD-10-CM | POA: Diagnosis not present

## 2018-06-07 DIAGNOSIS — S8392XA Sprain of unspecified site of left knee, initial encounter: Secondary | ICD-10-CM | POA: Diagnosis present

## 2018-06-07 DIAGNOSIS — S92812A Other fracture of left foot, initial encounter for closed fracture: Secondary | ICD-10-CM | POA: Insufficient documentation

## 2018-06-07 NOTE — Discharge Instructions (Addendum)
Elevate above the level of your heart to control swelling and pain.  Use crutches ,cane or a walker to help with your ambulation to help minimize your pain.  For pain recommend Tylenol 500 mg and with ibuprofen 400 mg every 6 hours as necessary.  If you are not improving or worsening follow-up with podiatrist

## 2018-06-07 NOTE — ED Provider Notes (Signed)
[facility]    CSN: [id number] Arrival date & time: 06/07/18  1638     History   Chief Complaint Chief Complaint  Patient presents with  . Fall  . Leg Pain    HPI Sydney Gillespie is a 78 y.o. female.   HPI  78-year-old female accompanied by her husband states that she was taking her dog out this morning and slipped on an icy deck.  She states she fell twisting her lower leg into a valgus stress.  Is that the pain radiates from her left foot up to just below her left knee.  She has not heard any other parts.  She did not hit her head or have loss of consciousness.  Mostly tender of the foot mostly the midfoot.  She does not have any tenderness along the malleolar.  There is no tenderness of the tibia.  Most of her knee pain is medial and posterior.       Past Medical History:  Diagnosis Date  . Allergic rhinitis   . Allergy   . Anemia   . Anemia   . Barrett's esophagus   . Blood dyscrasia    multiple myloma remission  . Cancer ([facility])   . Change in bowel habits   . Compression fracture 2013  . Degenerative disc disease, lumbar   . Dysrhythmia    palpitations  . Esophageal reflux   . Family history of adverse reaction to anesthesia    nausea -mom  . GERD (gastroesophageal reflux disease)   . Gout   . 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[facility])   . Heart palpitations   . Hyperlipidemia   . Hypothyroidism   . Inflammatory polyarthropathy ([facility])   . Lumbago   . Lumbar radiculitis   . Lumbar stenosis with neurogenic claudication   . Multiple myeloma ([facility])   . Multiple myeloma ([facility])   . Osteopenia   . Osteoporosis   . Other dysphagia   . Palpitations   . Positive PPD   . Renal insufficiency   . Rheumatoid arthritis ([facility])   . Rheumatoid arthritis ([facility])   . Seasonal allergies   . Vertigo     Patient Active Problem List   Diagnosis Date Noted  . Anemia due to stage 3 chronic kidney disease ([facility]) 12/28/2016  . Lumbar radiculopathy 06/18/2016  . Fatigue  01/29/2016  . Low serum vitamin D 01/29/2016  . Multiple myeloma in remission ([facility]) 12/18/2015  . Spondylolisthesis of lumbar region 11/25/2015  . Soft tissue lesion of shoulder region 09/11/2015  . Acid reflux 09/11/2015  . Rheumatoid arthritis involving multiple joints ([facility]) 09/11/2015  . 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 Degeneration of intervertebral disc of lumbar region 10/18/2013  . Neuritis or radiculitis due to rupture of lumbar intervertebral disc 10/18/2013  . Degenerative arthritis of lumbar spine 10/18/2013  . Allergic rhinitis 09/27/2013  . HLD (hyperlipidemia) 09/27/2013  . Awareness of heartbeats 09/27/2013  . Gout 09/21/2013  . Abnormal result of Mantoux test 09/21/2013  . Impaired renal function 09/21/2013  . 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  . OP (osteoporosis) 10/13/2011  . 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  . Rheumatoid arthritis ([facility]) 09/23/2011    Past Surgical History:  Procedure Laterality Date  . ABDOMINAL HYSTERECTOMY    . APPENDECTOMY    . BACK SURGERY    . BREAST EXCISIONAL BIOPSY Bilateral    neg  . CHOLECYSTECTOMY    . COLONOSCOPY WITH PROPOFOL N/A 11/12/2016   Procedure: COLONOSCOPY   WITH PROPOFOL;  Surgeon: Manya Silvas, MD;  Location: [facility] ENDOSCOPY;  Service: Endoscopy;  Laterality: N/A;  . ESOPHAGOGASTRODUODENOSCOPY (EGD) WITH PROPOFOL N/A 11/12/2016   Procedure: ESOPHAGOGASTRODUODENOSCOPY (EGD) WITH PROPOFOL;  Surgeon: Manya Silvas, MD;  Location: [facility] ENDOSCOPY;  Service: Endoscopy;  Laterality: N/A;  . FOOT SURGERY    . LUMBAR LAMINECTOMY/DECOMPRESSION MICRODISCECTOMY Left 06/18/2016   Procedure: LAMINECTOMY FOR FACET/SYNOVIAL CYST LUMBAR THREE - LUMBAR FOUR LEFT;  Surgeon: Newman Pies, MD;  Location: [place];  Service: Neurosurgery;  Laterality: Left;  LAMINECTOMY FOR FACET/SYNOVIAL CYST LUMBAR THREE - LUMBAR FOUR LEFT    OB History   No obstetric history on file.      Home Medications     Prior to Admission medications   Medication Sig Start Date End Date Taking? Authorizing Provider  allopurinol (ZYLOPRIM) 300 MG tablet Take 300 mg by mouth daily.  09/14/14  Yes [provider]  ALPRAZolam (XANAX) 0.25 MG tablet Take 0.25 mg by mouth at bedtime.  10/18/15  Yes [provider]  atenolol (TENORMIN) 50 MG tablet Take 50 mg by mouth daily.  10/17/13  Yes [provider]  cetirizine (ZYRTEC) 10 MG tablet Take 10 mg by mouth 2 (two) times daily.    Yes [provider]  Cyanocobalamin (B-12) 2500 MCG TABS Take 1 tablet by mouth daily.   Yes [provider]  fexofenadine (ALLEGRA) 180 MG tablet Take 180 mg by mouth 2 (two) times daily.   Yes [provider]  fluticasone (FLONASE) 50 MCG/ACT nasal spray Place 2 sprays into the nose daily.    Yes [provider]  hydroxychloroquine (PLAQUENIL) 200 MG tablet TAKE 2 TABLETS BY MOUTH DAILY 08/20/14  Yes [provider]  lansoprazole (PREVACID) 30 MG capsule Take 30 mg by mouth daily at 12 noon.   Yes [provider]  leflunomide (ARAVA) 10 MG tablet TK 1 T PO QD 11/17/17  Yes [provider]  levothyroxine (SYNTHROID, LEVOTHROID) 75 MCG tablet  11/02/17  Yes [provider]  tolterodine (DETROL LA) 4 MG 24 hr capsule Take 1 capsule (4 mg total) by mouth daily. 05/24/18  Yes MacDiarmid, Nicki Reaper, MD  triamterene-hydrochlorothiazide (MAXZIDE-25) 37.5-25 MG per tablet TAKE 1 TABLET BY MOUTH DAILY AS NEEDED FOR WATER RETENTION   Yes [provider]  spironolactone (ALDACTONE) 25 MG tablet  07/10/17   [provider]    Family History Family History  Problem Relation Age of Onset  . Hypertension Mother   . Heart attack Mother   . Rheum arthritis Mother   . Osteoarthritis Mother   . Cancer Father   . Hypertension Father   . Stroke Father   . Breast cancer Maternal Grandmother 92  . Osteoarthritis Other   . Rheum arthritis Other      Social History Social History   Tobacco Use  . Smoking status: Never Smoker  . Smokeless tobacco: Never Used  Substance Use Topics  . Alcohol use: No  . Drug use: No     Allergies   Isoniazid and Keflex [cephalexin]   Review of Systems Review of Systems  Constitutional: Positive for activity change. Negative for appetite change, chills, diaphoresis, fatigue and fever.  Musculoskeletal: Positive for gait problem and myalgias.  All other systems reviewed and are negative.    Physical Exam Triage Vital Signs ED Triage Vitals  Enc Vitals Group     BP 06/07/18 1715 (!) 151/83     Pulse Rate 06/07/18 1715 82  Resp 06/07/18 1715 18     Temp 06/07/18 1715 98.6 F (37 C)     Temp Source 06/07/18 1715 Oral     SpO2 06/07/18 1715 100 %     Weight 06/07/18 1712 160 lb (72.6 kg)     Height 06/07/18 1712 5' 7" (1.702 m)     Head Circumference --      Peak Flow --      Pain Score 06/07/18 1712 9     Pain Loc --      Pain Edu? --      Excl. in [place]? --    No data found.  Updated Vital Signs BP (!) 151/83 (BP Location: Left Arm)   Pulse 82   Temp 98.6 F (37 C) (Oral)   Resp 18   Ht 5' 7" (1.702 m)   Wt 160 lb (72.6 kg)   SpO2 100%   BMI 25.06 kg/m   Visual Acuity Right Eye Distance:   Left Eye Distance:   Bilateral Distance:    Right Eye Near:   Left Eye Near:    Bilateral Near:     Physical Exam Vitals signs and nursing note reviewed.  Constitutional:      General: She is not in acute distress.    Appearance: Normal appearance. She is normal weight. She is not ill-appearing, toxic-appearing or diaphoretic.  HENT:     Head: Normocephalic.     Nose: Nose normal.     Mouth/Throat:     Mouth: Mucous membranes are moist.  Eyes:     General:        Right eye: No discharge.        Left eye: No discharge.     Conjunctiva/sclera: Conjunctivae normal.  Neck:     Musculoskeletal: Normal range of motion and neck supple.  Pulmonary:     Effort:  Pulmonary effort is normal.     Breath sounds: Normal breath sounds.  Musculoskeletal:        General: Swelling, tenderness and signs of injury present.     Comments: Examination of the left lower extremity shows the knee to have no swelling or ecchymosis.  Patient has pain with valgus stress over the medial collateral ligament at the insertion of the tibia.  It does have an endpoint.  She has no patellofemoral symptoms.  Joint lines are nontender.  She has no tenderness over the proximal tibia or tibial plateau.  Exam of the left foot shows ecchymosis and swelling over the distal first metatarsal head.  Most tenderness is over the plantar first metatarsal head.  Also tender along the midfoot over tarsals.  Is mostly medial.  Skin:    General: Skin is warm and dry.  Neurological:     General: No focal deficit present.     Mental Status: She is alert and oriented to person, place, and time.  Psychiatric:        Mood and Affect: Mood normal.        Behavior: Behavior normal.        Thought Content: Thought content normal.        Judgment: Judgment normal.      UC Treatments / Results  Labs (all labs ordered are listed, but only abnormal results are displayed) Labs Reviewed - No data to display  EKG None  Radiology Dg Knee Complete 4 Views Left  Result Date: 06/07/2018 CLINICAL DATA:  Acute LEFT knee pain following fall. Initial encounter. EXAM: LEFT KNEE -   COMPLETE 4+ VIEW COMPARISON:  None. FINDINGS: No evidence of fracture, dislocation, or joint effusion. No evidence of arthropathy or other focal bone abnormality. Soft tissues are unremarkable. IMPRESSION: Negative. Electronically Signed   By: Margarette Canada M.D.   On: 06/07/2018 18:10   Dg Foot Complete Left  Result Date: 06/07/2018 CLINICAL DATA:  Acute LEFT foot pain following injury today. Initial encounter. EXAM: LEFT FOOT - COMPLETE 3+ VIEW COMPARISON:  None. FINDINGS: A fracture of the 1st metatarsal head sesamoid is age  indeterminate. Surgical screw within the distal 1st metatarsal noted. No other fracture or dislocation identified. IMPRESSION: Age indeterminate fracture of the 1st metatarsal head sesamoid. Correlate with pain. No other acute abnormalities noted. Electronically Signed   By: Margarette Canada M.D.   On: 06/07/2018 18:09    Procedures Procedures (including critical care time)  Medications Ordered in UC Medications - No data to display  Initial Impression / Assessment and Plan / UC Course  I have reviewed the triage vital signs and the nursing notes.  Pertinent labs & imaging results that were available during my care of the patient were reviewed by me and considered in my medical decision making (see chart for details).   Reviewed x-rays with the patient and her husband.  He has a fracture of the sesamoid under the first metatarsal head.  Believe she has a strain of her knee possibly the medial collateral ligament.  Recommended a postop shoe with Ace wrap.  She should keep her foot comfortable with crutches cane or walker to help support the weight on her foot.  She should elevate above her heart sufficiently to control swelling and pain.  Apply ice 20 minutes every 2 hours 4-5 times daily.  Follow-up with podiatrist.  Dr. Alvera Singh name was provided to the patient.  She will arrange appointment.   Final Clinical Impressions(s) / UC Diagnoses   Final diagnoses:  Closed fracture of sesamoid bone of left foot, initial encounter  Sprain of left knee, unspecified ligament, initial encounter     Discharge Instructions     Elevate above the level of your heart to control swelling and pain.  Use crutches ,cane or a walker to help with your ambulation to help minimize your pain.  For pain recommend Tylenol 500 mg and with ibuprofen 400 mg every 6 hours as necessary.  If you are not improving or worsening follow-up with podiatrist    ED Prescriptions    None     Controlled Substance  Prescriptions [place] Controlled Substance Registry consulted? Not Applicable   Lorin Picket, PA-C 06/07/18 2105

## 2018-06-07 NOTE — ED Triage Notes (Signed)
Patient states that she was taking out her dog this morning and fell while on her deck. Patient states that she twisted her leg. Pain radiates from left foot up to left knee.

## 2018-08-09 ENCOUNTER — Other Ambulatory Visit: Payer: Self-pay

## 2018-08-09 DIAGNOSIS — C9001 Multiple myeloma in remission: Secondary | ICD-10-CM

## 2018-08-10 ENCOUNTER — Inpatient Hospital Stay: Payer: Medicare Other | Attending: Internal Medicine

## 2018-08-10 ENCOUNTER — Other Ambulatory Visit: Payer: Self-pay

## 2018-08-10 ENCOUNTER — Inpatient Hospital Stay: Payer: Medicare Other | Admitting: Internal Medicine

## 2018-08-10 DIAGNOSIS — C9001 Multiple myeloma in remission: Secondary | ICD-10-CM | POA: Diagnosis present

## 2018-08-10 DIAGNOSIS — Z79899 Other long term (current) drug therapy: Secondary | ICD-10-CM | POA: Diagnosis not present

## 2018-08-10 DIAGNOSIS — E039 Hypothyroidism, unspecified: Secondary | ICD-10-CM | POA: Diagnosis not present

## 2018-08-10 DIAGNOSIS — D631 Anemia in chronic kidney disease: Secondary | ICD-10-CM | POA: Diagnosis not present

## 2018-08-10 DIAGNOSIS — N183 Chronic kidney disease, stage 3 (moderate): Secondary | ICD-10-CM | POA: Diagnosis not present

## 2018-08-10 LAB — COMPREHENSIVE METABOLIC PANEL
ALT: 9 U/L (ref 0–44)
AST: 25 U/L (ref 15–41)
Albumin: 4.2 g/dL (ref 3.5–5.0)
Alkaline Phosphatase: 79 U/L (ref 38–126)
Anion gap: 8 (ref 5–15)
BUN: 31 mg/dL — ABNORMAL HIGH (ref 8–23)
CO2: 18 mmol/L — ABNORMAL LOW (ref 22–32)
Calcium: 9.4 mg/dL (ref 8.9–10.3)
Chloride: 111 mmol/L (ref 98–111)
Creatinine, Ser: 2.03 mg/dL — ABNORMAL HIGH (ref 0.44–1.00)
GFR calc Af Amer: 27 mL/min — ABNORMAL LOW (ref >60)
GFR calc non Af Amer: 23 mL/min — ABNORMAL LOW (ref >60)
Glucose, Bld: 86 mg/dL (ref 70–99)
Potassium: 4 mmol/L (ref 3.5–5.1)
Sodium: 137 mmol/L (ref 135–145)
Total Bilirubin: 0.4 mg/dL (ref 0.3–1.2)
Total Protein: 7.2 g/dL (ref 6.5–8.1)

## 2018-08-10 LAB — CBC WITH DIFFERENTIAL/PLATELET
Abs Immature Granulocytes: 0.05 10*3/uL (ref 0.00–0.07)
Basophils Absolute: 0.1 10*3/uL (ref 0.0–0.1)
Basophils Relative: 2 %
Eosinophils Absolute: 0.3 10*3/uL (ref 0.0–0.5)
Eosinophils Relative: 5 %
HCT: 33.4 % — ABNORMAL LOW (ref 36.0–46.0)
Hemoglobin: 10.7 g/dL — ABNORMAL LOW (ref 12.0–15.0)
Immature Granulocytes: 1 %
Lymphocytes Relative: 19 %
Lymphs Abs: 1.3 10*3/uL (ref 0.7–4.0)
MCH: 31.4 pg (ref 26.0–34.0)
MCHC: 32 g/dL (ref 30.0–36.0)
MCV: 97.9 fL (ref 80.0–100.0)
Monocytes Absolute: 0.4 10*3/uL (ref 0.1–1.0)
Monocytes Relative: 6 %
Neutro Abs: 4.7 10*3/uL (ref 1.7–7.7)
Neutrophils Relative %: 67 %
Platelets: 166 10*3/uL (ref 150–400)
RBC: 3.41 MIL/uL — ABNORMAL LOW (ref 3.87–5.11)
RDW: 14 % (ref 11.5–15.5)
WBC: 6.9 10*3/uL (ref 4.0–10.5)
nRBC: 0 % (ref 0.0–0.2)

## 2018-08-10 LAB — IRON AND TIBC
Iron: 81 ug/dL (ref 28–170)
Saturation Ratios: 31 % (ref 10.4–31.8)
TIBC: 263 ug/dL (ref 250–450)
UIBC: 182 ug/dL

## 2018-08-10 LAB — FERRITIN: Ferritin: 223 ng/mL (ref 11–307)

## 2018-08-10 LAB — TSH: TSH: 6.625 u[IU]/mL — ABNORMAL HIGH (ref 0.350–4.500)

## 2018-08-11 ENCOUNTER — Inpatient Hospital Stay: Payer: Medicare Other | Admitting: Internal Medicine

## 2018-08-11 LAB — MULTIPLE MYELOMA PANEL, SERUM
Albumin SerPl Elph-Mcnc: 3.7 g/dL (ref 2.9–4.4)
Albumin/Glob SerPl: 1.4 (ref 0.7–1.7)
Alpha 1: 0.2 g/dL (ref 0.0–0.4)
Alpha2 Glob SerPl Elph-Mcnc: 1 g/dL (ref 0.4–1.0)
B-Globulin SerPl Elph-Mcnc: 0.8 g/dL (ref 0.7–1.3)
Gamma Glob SerPl Elph-Mcnc: 0.6 g/dL (ref 0.4–1.8)
Globulin, Total: 2.7 g/dL (ref 2.2–3.9)
IgA: 47 mg/dL — ABNORMAL LOW (ref 64–422)
IgG (Immunoglobin G), Serum: 777 mg/dL (ref 586–1602)
IgM (Immunoglobulin M), Srm: 13 mg/dL — ABNORMAL LOW (ref 26–217)
M Protein SerPl Elph-Mcnc: 0.2 g/dL — ABNORMAL HIGH
Total Protein ELP: 6.4 g/dL (ref 6.0–8.5)

## 2018-08-11 LAB — KAPPA/LAMBDA LIGHT CHAINS
Kappa free light chain: 49.2 mg/L — ABNORMAL HIGH (ref 3.3–19.4)
Kappa, lambda light chain ratio: 2.93 — ABNORMAL HIGH (ref 0.26–1.65)
Lambda free light chains: 16.8 mg/L (ref 5.7–26.3)

## 2018-08-16 ENCOUNTER — Telehealth: Payer: Self-pay | Admitting: Internal Medicine

## 2018-08-17 ENCOUNTER — Inpatient Hospital Stay: Payer: Medicare Other | Admitting: Internal Medicine

## 2018-08-18 ENCOUNTER — Inpatient Hospital Stay (HOSPITAL_BASED_OUTPATIENT_CLINIC_OR_DEPARTMENT_OTHER): Payer: Medicare Other | Admitting: Internal Medicine

## 2018-08-18 ENCOUNTER — Encounter: Payer: Self-pay | Admitting: Internal Medicine

## 2018-08-18 ENCOUNTER — Other Ambulatory Visit: Payer: Self-pay

## 2018-08-18 ENCOUNTER — Other Ambulatory Visit: Payer: Self-pay | Admitting: *Deleted

## 2018-08-18 DIAGNOSIS — C9000 Multiple myeloma not having achieved remission: Secondary | ICD-10-CM

## 2018-08-18 DIAGNOSIS — C9001 Multiple myeloma in remission: Secondary | ICD-10-CM

## 2018-08-18 NOTE — Progress Notes (Signed)
I connected with Sydney Gillespie on 08/19/18 at  1:45 PM EDT by telephone visit and verified that I am speaking with the correct person using two identifiers.  I discussed the limitations, risks, security and privacy concerns of performing an evaluation and management service by telemedicine and the availability of in-person appointments. I also discussed with the patient that there may be a patient responsible charge related to this service. The patient expressed understanding and agreed to proceed.    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 benign     Multiple myeloma not having achieved remission ([place])   12/18/2015 Initial Diagnosis    Multiple myeloma in remission ([place])        History of present illness:Sydney Gillespie 78 y.o.  female with history of multiple myeloma-currently on surveillance.  Patient appetite is fair.  No nausea no vomiting.  No headaches.  No bone pain.  No chest pain or shortness with cough.  No tingling numbness.  Observation/objective: April 2020 M protein 0.2 g/dL.  Hemoglobin 10.7 overall stable.  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  April 2020 M protein 0.2 g/dL; kappa lambda light chain ratio 2.9.  Discussed that patient will likely need a bone marrow biopsy if M protein continues to go up/or if anemia or renal insufficiency worsens.  Continue surveillance at this  time  # Anemia-secondary to chronic kidney disease; hemoglobin 10.7 overall stable.  Iron studies-April 2029 normal limits.  # # CKD stage III- IVcreatinine-2.0 stable [Dr.Singh]   #Hypothyroidism-TSH 6.5.  Continue current dose of Synthroid.  Will repeat thyroid panel in 3 months.  #Disposition follow-up-in 3 months/CBC CMP myeloma panel kappa lambda light chain/thyroid panel.  Cc; Dr.Hande    Follow-up instructions:  I discussed the assessment and treatment plan with the patient.  The patient was provided an opportunity to ask questions and all were answered.  The patient agreed with the plan and demonstrated understanding of instructions.  The patient was advised to call back or seek an in person evaluation if the symptoms worsen or if the condition fails to improve as anticipated.  I provided 12 minutes of non face-to-face telephone visit time during this encounter, and > 50% was spent counseling as documented under my assessment & plan.   Dr. Charlaine Dalton [place] at [facility] 08/19/2018 9:52 AM

## 2018-08-18 NOTE — Assessment & Plan Note (Addendum)
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  April 2020 M protein 0.2 g/dL; kappa lambda light chain ratio 2.9.  Discussed that patient will likely need a bone marrow biopsy if M protein continues to go up/or if anemia or renal insufficiency worsens.  Continue surveillance at this time  # Anemia-secondary to chronic kidney disease; hemoglobin 10.7 overall stable.  Iron studies-April 2029 normal limits.  # # CKD stage III- IVcreatinine-2.0 stable [Dr.Singh]   #Hypothyroidism-TSH 6.5.  Continue current dose of Synthroid.  Will repeat thyroid panel in 3 months.  #Disposition follow-up-in 3 months/CBC CMP myeloma panel kappa lambda light chain/thyroid panel.  Cc; Dr.Hande

## 2018-09-01 ENCOUNTER — Emergency Department
Admission: EM | Admit: 2018-09-01 | Discharge: 2018-09-01 | Disposition: A | Discharge status: Other Acute Inpatient Hospital | Payer: Medicare Other | Attending: Emergency Medicine | Admitting: Emergency Medicine

## 2018-09-01 ENCOUNTER — Inpatient Hospital Stay (HOSPITAL_COMMUNITY)
Admission: AD | Admit: 2018-09-01 | Discharge: 2018-09-05 | DRG: 871 | Disposition: A | Discharge status: Home Health | Payer: Medicare Other | Source: Other Acute Inpatient Hospital | Attending: Internal Medicine | Admitting: Internal Medicine

## 2018-09-01 ENCOUNTER — Other Ambulatory Visit: Payer: Self-pay

## 2018-09-01 ENCOUNTER — Encounter (HOSPITAL_COMMUNITY): Payer: Self-pay

## 2018-09-01 ENCOUNTER — Emergency Department: Payer: Medicare Other

## 2018-09-01 DIAGNOSIS — E039 Hypothyroidism, unspecified: Secondary | ICD-10-CM | POA: Diagnosis present

## 2018-09-01 DIAGNOSIS — N179 Acute kidney failure, unspecified: Secondary | ICD-10-CM | POA: Diagnosis present

## 2018-09-01 DIAGNOSIS — J988 Other specified respiratory disorders: Secondary | ICD-10-CM

## 2018-09-01 DIAGNOSIS — A4189 Other specified sepsis: Principal | ICD-10-CM | POA: Diagnosis present

## 2018-09-01 DIAGNOSIS — I10 Essential (primary) hypertension: Secondary | ICD-10-CM | POA: Diagnosis not present

## 2018-09-01 DIAGNOSIS — J1282 Pneumonia due to coronavirus disease 2019: Secondary | ICD-10-CM | POA: Diagnosis present

## 2018-09-01 DIAGNOSIS — Z823 Family history of stroke: Secondary | ICD-10-CM

## 2018-09-01 DIAGNOSIS — J9601 Acute respiratory failure with hypoxia: Secondary | ICD-10-CM | POA: Diagnosis present

## 2018-09-01 DIAGNOSIS — Z90710 Acquired absence of both cervix and uterus: Secondary | ICD-10-CM

## 2018-09-01 DIAGNOSIS — K219 Gastro-esophageal reflux disease without esophagitis: Secondary | ICD-10-CM | POA: Diagnosis present

## 2018-09-01 DIAGNOSIS — E785 Hyperlipidemia, unspecified: Secondary | ICD-10-CM | POA: Diagnosis present

## 2018-09-01 DIAGNOSIS — Z803 Family history of malignant neoplasm of breast: Secondary | ICD-10-CM

## 2018-09-01 DIAGNOSIS — E872 Acidosis: Secondary | ICD-10-CM | POA: Diagnosis present

## 2018-09-01 DIAGNOSIS — N184 Chronic kidney disease, stage 4 (severe): Secondary | ICD-10-CM | POA: Diagnosis present

## 2018-09-01 DIAGNOSIS — N183 Chronic kidney disease, stage 3 (moderate): Secondary | ICD-10-CM | POA: Insufficient documentation

## 2018-09-01 DIAGNOSIS — K2270 Barrett's esophagus without dysplasia: Secondary | ICD-10-CM | POA: Diagnosis present

## 2018-09-01 DIAGNOSIS — I951 Orthostatic hypotension: Secondary | ICD-10-CM | POA: Diagnosis present

## 2018-09-01 DIAGNOSIS — I129 Hypertensive chronic kidney disease with stage 1 through stage 4 chronic kidney disease, or unspecified chronic kidney disease: Secondary | ICD-10-CM | POA: Diagnosis present

## 2018-09-01 DIAGNOSIS — R002 Palpitations: Secondary | ICD-10-CM | POA: Diagnosis present

## 2018-09-01 DIAGNOSIS — R55 Syncope and collapse: Secondary | ICD-10-CM

## 2018-09-01 DIAGNOSIS — R9431 Abnormal electrocardiogram [ECG] [EKG]: Secondary | ICD-10-CM | POA: Diagnosis present

## 2018-09-01 DIAGNOSIS — E860 Dehydration: Secondary | ICD-10-CM | POA: Diagnosis present

## 2018-09-01 DIAGNOSIS — Z888 Allergy status to other drugs, medicaments and biological substances status: Secondary | ICD-10-CM

## 2018-09-01 DIAGNOSIS — Z8261 Family history of arthritis: Secondary | ICD-10-CM

## 2018-09-01 DIAGNOSIS — D72819 Decreased white blood cell count, unspecified: Secondary | ICD-10-CM | POA: Diagnosis present

## 2018-09-01 DIAGNOSIS — J189 Pneumonia, unspecified organism: Secondary | ICD-10-CM

## 2018-09-01 DIAGNOSIS — Z9049 Acquired absence of other specified parts of digestive tract: Secondary | ICD-10-CM

## 2018-09-01 DIAGNOSIS — Z9484 Stem cells transplant status: Secondary | ICD-10-CM

## 2018-09-01 DIAGNOSIS — C9001 Multiple myeloma in remission: Secondary | ICD-10-CM | POA: Diagnosis present

## 2018-09-01 DIAGNOSIS — D696 Thrombocytopenia, unspecified: Secondary | ICD-10-CM | POA: Diagnosis present

## 2018-09-01 DIAGNOSIS — Z789 Other specified health status: Secondary | ICD-10-CM

## 2018-09-01 DIAGNOSIS — J181 Lobar pneumonia, unspecified organism: Secondary | ICD-10-CM | POA: Diagnosis not present

## 2018-09-01 DIAGNOSIS — M069 Rheumatoid arthritis, unspecified: Secondary | ICD-10-CM | POA: Diagnosis present

## 2018-09-01 DIAGNOSIS — J1289 Other viral pneumonia: Secondary | ICD-10-CM | POA: Diagnosis present

## 2018-09-01 DIAGNOSIS — Z79899 Other long term (current) drug therapy: Secondary | ICD-10-CM

## 2018-09-01 DIAGNOSIS — Z79890 Hormone replacement therapy (postmenopausal): Secondary | ICD-10-CM

## 2018-09-01 DIAGNOSIS — Z8249 Family history of ischemic heart disease and other diseases of the circulatory system: Secondary | ICD-10-CM

## 2018-09-01 LAB — BASIC METABOLIC PANEL
Anion gap: 14 (ref 5–15)
BUN: 60 mg/dL — ABNORMAL HIGH (ref 8–23)
CO2: 15 mmol/L — ABNORMAL LOW (ref 22–32)
Calcium: 8.8 mg/dL — ABNORMAL LOW (ref 8.9–10.3)
Chloride: 103 mmol/L (ref 98–111)
Creatinine, Ser: 2.59 mg/dL — ABNORMAL HIGH (ref 0.44–1.00)
GFR calc Af Amer: 20 mL/min — ABNORMAL LOW (ref >60)
GFR calc non Af Amer: 17 mL/min — ABNORMAL LOW (ref >60)
Glucose, Bld: 97 mg/dL (ref 70–99)
Potassium: 4 mmol/L (ref 3.5–5.1)
Sodium: 132 mmol/L — ABNORMAL LOW (ref 135–145)

## 2018-09-01 LAB — CBC
HCT: 37.6 % (ref 36.0–46.0)
Hemoglobin: 12.4 g/dL (ref 12.0–15.0)
MCH: 31.2 pg (ref 26.0–34.0)
MCHC: 33 g/dL (ref 30.0–36.0)
MCV: 94.5 fL (ref 80.0–100.0)
Platelets: 120 10*3/uL — ABNORMAL LOW (ref 150–400)
RBC: 3.98 MIL/uL (ref 3.87–5.11)
RDW: 13.3 % (ref 11.5–15.5)
WBC: 5.7 10*3/uL (ref 4.0–10.5)
nRBC: 0 % (ref 0.0–0.2)

## 2018-09-01 LAB — D-DIMER, QUANTITATIVE: D-Dimer, Quant: 1.31 ug/mL-FEU — ABNORMAL HIGH (ref 0.00–0.50)

## 2018-09-01 LAB — LACTATE DEHYDROGENASE: LDH: 188 U/L (ref 98–192)

## 2018-09-01 LAB — C-REACTIVE PROTEIN: CRP: 2.8 mg/dL — ABNORMAL HIGH (ref <1.0)

## 2018-09-01 LAB — FERRITIN: Ferritin: 621 ng/mL — ABNORMAL HIGH (ref 11–307)

## 2018-09-01 LAB — TROPONIN I
Troponin I: <0.03 ng/mL (ref <0.03)
Troponin I: <0.03 ng/mL (ref <0.03)

## 2018-09-01 LAB — STREP PNEUMONIAE URINARY ANTIGEN: Strep Pneumo Urinary Antigen: NEGATIVE

## 2018-09-01 LAB — PROCALCITONIN: Procalcitonin: <0.1 ng/mL

## 2018-09-01 LAB — ABO/RH: ABO/RH(D): O POS

## 2018-09-01 LAB — GLUCOSE, CAPILLARY: Glucose-Capillary: 85 mg/dL (ref 70–99)

## 2018-09-01 MED ORDER — HYDROXYCHLOROQUINE SULFATE 200 MG PO TABS
200.0000 mg | ORAL_TABLET | Freq: Two times a day (BID) | ORAL | Status: DC
  Administered 2018-09-01 – 2018-09-04 (×7): 200 mg via ORAL
  Filled 2018-09-01 (×7): qty 1

## 2018-09-01 MED ORDER — ACETAMINOPHEN 325 MG PO TABS: 650.0000 mg | ORAL_TABLET | Freq: Four times a day (QID) | ORAL | Status: DC | PRN

## 2018-09-01 MED ORDER — SENNA 8.6 MG PO TABS
1.0000 | ORAL_TABLET | Freq: Two times a day (BID) | ORAL | Status: DC
  Administered 2018-09-02 – 2018-09-05 (×7): 8.6 mg via ORAL
  Filled 2018-09-01 (×8): qty 1

## 2018-09-01 MED ORDER — GUAIFENESIN-DM 100-10 MG/5ML PO SYRP
10.0000 mL | ORAL_SOLUTION | ORAL | Status: DC | PRN
  Administered 2018-09-03: 08:00:00 10 mL via ORAL
  Filled 2018-09-01 (×2): qty 10

## 2018-09-01 MED ORDER — SODIUM CHLORIDE 0.9 % IV SOLN
Freq: Once | INTRAVENOUS | Status: AC
  Administered 2018-09-01: 11:00:00 1000 mL via INTRAVENOUS

## 2018-09-01 MED ORDER — ENOXAPARIN SODIUM 30 MG/0.3ML ~~LOC~~ SOLN
30.0000 mg | SUBCUTANEOUS | Status: DC
  Administered 2018-09-01: 30 mg via SUBCUTANEOUS
  Filled 2018-09-01: qty 0.3

## 2018-09-01 MED ORDER — ZINC SULFATE 220 (50 ZN) MG PO CAPS
220.0000 mg | ORAL_CAPSULE | Freq: Every day | ORAL | Status: DC
  Administered 2018-09-01 – 2018-09-05 (×5): 220 mg via ORAL
  Filled 2018-09-01 (×5): qty 1

## 2018-09-01 MED ORDER — ONDANSETRON HCL 4 MG PO TABS: 4.0000 mg | ORAL_TABLET | Freq: Four times a day (QID) | ORAL | Status: DC | PRN

## 2018-09-01 MED ORDER — SODIUM CHLORIDE 0.9% FLUSH
3.0000 mL | Freq: Two times a day (BID) | INTRAVENOUS | Status: DC
  Administered 2018-09-02 – 2018-09-04 (×4): 3 mL via INTRAVENOUS

## 2018-09-01 MED ORDER — SORBITOL 70 % SOLN: 30.0000 mL | Freq: Every day | Status: DC | PRN

## 2018-09-01 MED ORDER — SODIUM CHLORIDE 0.9 % IV SOLN
INTRAVENOUS | Status: DC
  Administered 2018-09-01 – 2018-09-04 (×4): via INTRAVENOUS

## 2018-09-01 MED ORDER — FLEET ENEMA 7-19 GM/118ML RE ENEM: 1.0000 | ENEMA | Freq: Once | RECTAL | Status: DC | PRN

## 2018-09-01 MED ORDER — ENSURE ENLIVE PO LIQD
237.0000 mL | Freq: Two times a day (BID) | ORAL | Status: DC
  Administered 2018-09-02: 237 mL via ORAL

## 2018-09-01 MED ORDER — ONDANSETRON HCL 4 MG/2ML IJ SOLN
4.0000 mg | Freq: Four times a day (QID) | INTRAMUSCULAR | Status: DC | PRN
  Administered 2018-09-01 – 2018-09-02 (×2): 4 mg via INTRAVENOUS
  Filled 2018-09-01 (×2): qty 2

## 2018-09-01 MED ORDER — ACETAMINOPHEN 650 MG RE SUPP: 650.0000 mg | Freq: Four times a day (QID) | RECTAL | Status: DC | PRN

## 2018-09-01 MED ORDER — VITAMIN C 500 MG PO TABS
500.0000 mg | ORAL_TABLET | Freq: Every day | ORAL | Status: DC
  Administered 2018-09-01 – 2018-09-05 (×5): 500 mg via ORAL
  Filled 2018-09-01 (×5): qty 1

## 2018-09-01 MED ORDER — HYDROCOD POLST-CPM POLST ER 10-8 MG/5ML PO SUER
5.0000 mL | Freq: Two times a day (BID) | ORAL | Status: DC | PRN
  Administered 2018-09-03 – 2018-09-05 (×3): 5 mL via ORAL
  Filled 2018-09-01 (×4): qty 5

## 2018-09-01 NOTE — H&P (Signed)
[facility] History and Physical  Sydney Gillespie UXL:[id number] DOB: 1941/04/09 DOA: 09/01/2018 Referring physician: ED PCP: [name], MD  Chief Complaint: COVID-19 Positive, syncope ------------------------------------------------------------------------------------------------------ Assessment/Plan: Active Problems:   Syncope   Pneumonia due to COVID-19 virus  COVID-19 pneumonia - Currently low risk on 2 L oxygen via nasal cannula at this time.  - Chest x-ray showed patchy and indistinct opacity in the right lung new since December compatible with COVID-19 pneumonia. - Monitor in COVID unit.  - Droplet/contact isolation - Initial panel for LDH, Ferritin, d-dimer, procalcitonin, Troponin, CRP levels sent.  Ordered for next 3 days as well. - Keep prone for 16 hours a day. - Mucinex, inhaler, IS ordered. - Wean down oxygen as tolerated -Send for strep pneumoniae antigen and Legionella antigen to rule out other causes of pneumonia. - We will continue to monitor respiratory symptoms.  Date: '@TODAY' @ 4:38 PM  Patient Isolation: Droplet+Contact HCP PPE: Wearing all recommended PPE N95 mask + eye protection + Gown + Gloves + Surgical Cap wearing a facemask Patient PPE: facemask  Syncope Patient slumped to the floor but she is not sure if she lost consciousness telemetry monitoring Normal saline 100 mL/h  Acute kidney injury on CKD stage IV Creatinine at presentation 2.59. Baseline creatinine close to 2. Reevaluate with IV fluid hydration.  Rheumatoid arthritis -I looked at therecent guidelines by [facility] of rheumatology for adult patients with rheumatoid arthritis on immunosuppressants for acute COVID-19 infection.  Recommendation is to continue immunosuppression like Plaquenil/chloroquine and hold others like leflunomide/methotrexate. So in this patient, will continue Plaquenil and keep leflunomide on hold.  Hypertension At home patient was on  atenolol which she says is mostly for palpitation.  I see diuretics in her list of home medicines.  Patient denies taking them.  Multiple myeloma  GERD  Continue PPI  Mobility: Encourage ambulation inside the room Diet: Cardiac diet DVT prophylaxis:  Lovenox renally dosed Code Status:  Full code, patient states ' do what you have to do to keep him alive.' Disposition Plan:  Hopefully home in 2 to 3 days if respiratory status does not get worse.  ----------------------------------------------------------------------------------------------------- History of Present Illness: Patient is 78 year old female with history of multiple myeloma, rheumatoid arthritis on immunosuppressants, HTN, HLD, chronic kidney disease IV, Barrett's esophagus, hypothyroidism. Patient was checked for COVID-19 a week ago on Friday 4/17 as an outpatient.  She got the positive result of that on Monday 4/20. At home, patient has been having worsening productive cough.  She has been getting generalized weakness, dehydration.  This morning, she had a syncopal episode, slumped to the floor.  Patient is not sure if she lost consciousness.  She was brought to ED at [facility]. In the ED, temperature 98.5, breathing comfortably on room air, blood pressure 131/57. Labs showed sodium level low at 132, potassium 4, serum bicarb 15, BUN/creatinine 60/2.59, baseline creatinine of 2, troponin negative at 0.03, WBC count normal 5.7, hemoglobin 12.4, platelet slightly low at 120. Chest x-ray showed patchy and indistinct opacity in the right lung new since December compatible with COVID-19 pneumonia.  Review of Systems:  All systems were reviewed and were negative unless otherwise mentioned in the HPI   Past medical history: Past Medical History:  Diagnosis Date  . Allergic rhinitis   . Allergy   . Anemia   . Anemia   . Barrett's esophagus   . Blood dyscrasia    multiple myloma remission  . Cancer ([place])   . Change in bowel  habits    . Compression fracture 2013  . Degenerative disc disease, lumbar   . Dysrhythmia    palpitations  . Esophageal reflux   . Family history of adverse reaction to anesthesia    nausea -mom  . GERD (gastroesophageal reflux disease)   . Gout   . 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[place])   . Heart palpitations   . Hyperlipidemia   . Hypothyroidism   . Inflammatory polyarthropathy ([place])   . Lumbago   . Lumbar radiculitis   . Lumbar stenosis with neurogenic claudication   . Multiple myeloma ([place])   . Multiple myeloma ([place])   . Osteopenia   . Osteoporosis   . Other dysphagia   . Palpitations   . Positive PPD   . Renal insufficiency   . Rheumatoid arthritis ([place])   . Rheumatoid arthritis ([place])   . Seasonal allergies   . Vertigo     Past surgical history: Past Surgical History:  Procedure Laterality Date  . ABDOMINAL HYSTERECTOMY    . APPENDECTOMY    . BACK SURGERY    . BREAST EXCISIONAL BIOPSY Bilateral    neg  . CHOLECYSTECTOMY    . COLONOSCOPY WITH PROPOFOL N/A 11/12/2016   Procedure: COLONOSCOPY WITH PROPOFOL;  Surgeon: Manya Silvas, MD;  Location: [facility] ENDOSCOPY;  Service: Endoscopy;  Laterality: N/A;  . ESOPHAGOGASTRODUODENOSCOPY (EGD) WITH PROPOFOL N/A 11/12/2016   Procedure: ESOPHAGOGASTRODUODENOSCOPY (EGD) WITH PROPOFOL;  Surgeon: Manya Silvas, MD;  Location[facility] ENDOSCOPY;  Service: Endoscopy;  Laterality: N/A;  . FOOT SURGERY    . LUMBAR LAMINECTOMY/DECOMPRESSION MICRODISCECTOMY Left 06/18/2016   Procedure: LAMINECTOMY FOR FACET/SYNOVIAL CYST LUMBAR THREE - LUMBAR FOUR LEFT;  Surgeon: Newman Pies, MD;  Location: [place];  Service: Neurosurgery;  Laterality: Left;  LAMINECTOMY FOR FACET/SYNOVIAL CYST LUMBAR THREE - LUMBAR FOUR LEFT    Social History:  reports that she has never smoked. She has never used smokeless tobacco. She reports that she does not drink alcohol or use drugs.  Allergies:  Allergies  Allergen Reactions  . Isoniazid Other (See Comments)     Blisters   . Keflex [Cephalexin] Other (See Comments)    Blisters     Family history:  Family History  Problem Relation Age of Onset  . Hypertension Mother   . Heart attack Mother   . Rheum arthritis Mother   . Osteoarthritis Mother   . Cancer Father   . Hypertension Father   . Stroke Father   . Breast cancer Maternal Grandmother 46  . Osteoarthritis Other   . Rheum arthritis Other      Home Meds: Prior to Admission medications   Medication Sig Start Date End Date Taking? Authorizing Provider  allopurinol (ZYLOPRIM) 300 MG tablet Take 300 mg by mouth daily.  09/14/14   [provider]  ALPRAZolam Duanne Moron) 0.25 MG tablet Take 0.25 mg by mouth at bedtime.  10/18/15   [provider]  atenolol (TENORMIN) 50 MG tablet Take 50 mg by mouth daily.  10/17/13   [provider]  azithromycin (ZITHROMAX) 250 MG tablet Take 250-500 mg by mouth daily. 08/31/18 09/03/18  [provider]  cetirizine (ZYRTEC) 10 MG tablet Take 10 mg by mouth 2 (two) times daily.     [provider]  Cyanocobalamin (B-12) 2500 MCG TABS Take 1 tablet by mouth daily.    [provider]  fexofenadine (ALLEGRA) 180 MG tablet Take 180 mg by mouth 2 (two) times daily.    [provider]  fluticasone (FLONASE) 50 MCG/ACT nasal spray Place 2 sprays into the nose daily.     [provider]  hydroxychloroquine (PLAQUENIL) 200 MG tablet TAKE 2 TABLETS BY MOUTH DAILY 08/20/14   [provider]  lansoprazole (PREVACID) 30 MG capsule Take 30 mg by mouth daily at 12 noon.    [provider]  leflunomide (ARAVA) 10 MG tablet TK 1 T PO QD 11/17/17   [provider]  levothyroxine (SYNTHROID, LEVOTHROID) 75 MCG tablet  11/02/17   [provider]  oxybutynin (DITROPAN-XL) 10 MG 24 hr tablet Take 10 mg by mouth daily. 06/09/18   [provider]  spironolactone (ALDACTONE) 25 MG tablet  07/10/17   [provider]   tolterodine (DETROL LA) 4 MG 24 hr capsule Take 1 capsule (4 mg total) by mouth daily. 05/24/18   Bjorn Loser, MD  triamterene-hydrochlorothiazide (MAXZIDE-25) 37.5-25 MG per tablet TAKE 1 TABLET BY MOUTH DAILY AS NEEDED FOR WATER RETENTION    [provider]  Vitamin D, Ergocalciferol, (DRISDOL) 1.25 MG (50000 UT) CAPS capsule Take 1 capsule by mouth once a week. 08/18/18 10/17/18  [provider]    Physical Exam: Vitals:   09/01/18 1537  BP: (!) 131/57  Resp: 20  Temp: 98.5 F (36.9 C)  TempSrc: Oral  SpO2: 98%   Wt Readings from Last 3 Encounters:  09/01/18 72.6 kg  06/07/18 72.6 kg  04/12/18 72.6 kg   There is no height or weight on file to calculate BMI.  General exam: Appears calm and comfortable.  Propped up in bed.  On 2 L oxygen by nasal cannula Skin: No rashes, lesions or ulcers. HEENT: Normal Lungs: Clear to auscultation at the time of my evaluation.   CVS: First and second heart sound heard, no murmur GI/Abd soft, nondistended, nontender, bowel sound present CNS: Alert, awake, oriented x3 Psychiatry: Mood & affect appropriate.  Extremities: No pedal edema, no calf tenderness  Labs on Admission:   CBC: Recent Labs  Lab 09/01/18 1024  WBC 5.7  HGB 12.4  HCT 37.6  MCV 94.5  PLT 120*    Basic Metabolic Panel: Recent Labs  Lab 09/01/18 1024  NA 132*  K 4.0  CL 103  CO2 15*  GLUCOSE 97  BUN 60*  CREATININE 2.59*  CALCIUM 8.8*    Liver Function Tests: No results for input(s): AST, ALT, ALKPHOS, BILITOT, PROT, ALBUMIN in the last 168 hours. No results for input(s): LIPASE, AMYLASE in the last 168 hours. No results for input(s): AMMONIA in the last 168 hours.  Cardiac Enzymes: Recent Labs  Lab 09/01/18 1024  TROPONINI <0.03    BNP (last 3 results) No results for input(s): BNP in the last 8760 hours.  ProBNP (last 3 results) No results for input(s): PROBNP in the last 8760 hours.  CBG: Recent Labs  Lab 09/01/18  1031  GLUCAP 85    Lipase  No results found for: LIPASE   Urinalysis    Component Value Date/Time   APPEARANCEUR Clear 07/12/2017 1407   GLUCOSEU Negative 07/12/2017 1407   BILIRUBINUR Negative 07/12/2017 1407   PROTEINUR Negative 07/12/2017 1407   NITRITE Negative 07/12/2017 1407   LEUKOCYTESUR Negative 07/12/2017 1407     Drugs of Abuse  No results found for: LABOPIA, COCAINSCRNUR, LABBENZ, AMPHETMU, THCU, LABBARB    Radiological Exams on Admission: Dg Chest 1 View  Result Date: 09/01/2018 CLINICAL DATA:  78 year old female with syncope today. Reports positive for COVID-19. Cough and shortness of breath.  EXAM: CHEST  1 VIEW COMPARISON:  Chest radiographs 04/12/2018. FINDINGS: Portable AP upright view at 1037 hours. Peripheral patchy and indistinct opacity in the right upper lobe is new since December. Questionable mildly increased interstitial markings also at the right lung base. Chronic underlying pulmonary interstitial prominence and large lung volumes. Mediastinal contours remain normal. No pneumothorax or pleural effusion. Prior cervical ACDF. No acute osseous abnormality identified. IMPRESSION: Patchy and indistinct opacity in the right lung new since December compatible with COVID-19 pneumonia. Electronically Signed   By: Genevie Ann M.D.   On: 09/01/2018 11:04   ----------------------------------------------------------------------------------------------------------------------------------------------------------- Severity of Illness: The appropriate patient status for this patient is OBSERVATION. Observation status is judged to be reasonable and necessary in order to provide the required intensity of service to ensure the patient's safety. The patient's presenting symptoms, physical exam findings, and initial radiographic and laboratory data in the context of their medical condition is felt to place them at decreased risk for further clinical deterioration. Furthermore, it is  anticipated that the patient will be medically stable for discharge from the hospital within 2 midnights of admission. The following factors support the patient status of observation.   " The patient's presenting symptoms include cough, syncope, generalized weakness. " The physical exam findings include dehydration. " The initial radiographic and laboratory data are COVID-19 pneumonia, acute kidney injury.    Signed, Terrilee Croak, MD [facility] 09/01/2018

## 2018-09-01 NOTE — ED Provider Notes (Signed)
[facility] Emergency Department Provider Note       Time seen: ----------------------------------------- 10:30 AM on 09/01/2018 -----------------------------------------   I have reviewed the triage vital signs and the nursing notes.  HISTORY   Chief Complaint Near Syncope    HPI Sydney Gillespie is a 78 y.o. female with a history of anemia, GERD, hyperlipidemia, hypothyroidism, multiple myeloma, rheumatoid arthritis who presents to the ED for near syncope today.  Patient confirmed positive COVID test that resulted on Monday and she was tested on Friday.  She reports a thick productive cough.  She denies falling with a near syncopal episode today.  She arrives alert and oriented.  Past Medical History:  Diagnosis Date  . Allergic rhinitis   . Allergy   . Anemia   . Anemia   . Barrett's esophagus   . Blood dyscrasia    multiple myloma remission  . Cancer ([place])   . Change in bowel habits   . Compression fracture 2013  . Degenerative disc disease, lumbar   . Dysrhythmia    palpitations  . Esophageal reflux   . Family history of adverse reaction to anesthesia    nausea -mom  . GERD (gastroesophageal reflux disease)   . Gout   . 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[place])   . Heart palpitations   . Hyperlipidemia   . Hypothyroidism   . Inflammatory polyarthropathy ([place])   . Lumbago   . Lumbar radiculitis   . Lumbar stenosis with neurogenic claudication   . Multiple myeloma ([place])   . Multiple myeloma ([place])   . Osteopenia   . Osteoporosis   . Other dysphagia   . Palpitations   . Positive PPD   . Renal insufficiency   . Rheumatoid arthritis ([place])   . Rheumatoid arthritis ([place])   . Seasonal allergies   . Vertigo     Patient Active Problem List   Diagnosis Date Noted  . Anemia due to stage 3 chronic kidney disease ([place]) 12/28/2016  . Lumbar radiculopathy 06/18/2016  . Fatigue 01/29/2016  . Low serum vitamin D 01/29/2016  . Multiple myeloma  not having achieved remission ([place]) 12/18/2015  . Spondylolisthesis of lumbar region 11/25/2015  . Soft tissue lesion of shoulder region 09/11/2015  . Acid reflux 09/11/2015  . Rheumatoid arthritis involving multiple joints ([place]) 09/11/2015  . 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 Degeneration of intervertebral disc of lumbar region 10/18/2013  . Neuritis or radiculitis due to rupture of lumbar intervertebral disc 10/18/2013  . Degenerative arthritis of lumbar spine 10/18/2013  . Allergic rhinitis 09/27/2013  . HLD (hyperlipidemia) 09/27/2013  . Awareness of heartbeats 09/27/2013  . Gout 09/21/2013  . Abnormal result of Mantoux test 09/21/2013  . Impaired renal function 09/21/2013  . 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  . OP (osteoporosis) 10/13/2011  . 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 Rheumatoid arthritis ([place]) 09/23/2011    Past Surgical History:  Procedure Laterality Date  . ABDOMINAL HYSTERECTOMY    . APPENDECTOMY    . BACK SURGERY    . BREAST EXCISIONAL BIOPSY Bilateral    neg  . CHOLECYSTECTOMY    . COLONOSCOPY WITH PROPOFOL N/A 11/12/2016   Procedure: COLONOSCOPY WITH PROPOFOL;  Surgeon: Manya Silvas, MD;  Location: [facility] ENDOSCOPY;  Service: Endoscopy;  Laterality: N/A;  . ESOPHAGOGASTRODUODENOSCOPY (EGD) WITH PROPOFOL N/A 11/12/2016   Procedure: ESOPHAGOGASTRODUODENOSCOPY (EGD) WITH PROPOFOL;  Surgeon: Manya Silvas, MD;  Location: [facility] ENDOSCOPY;  Service: Endoscopy;  Laterality: N/A;  .  FOOT SURGERY    . LUMBAR LAMINECTOMY/DECOMPRESSION MICRODISCECTOMY Left 06/18/2016   Procedure: LAMINECTOMY FOR FACET/SYNOVIAL CYST LUMBAR THREE - LUMBAR FOUR LEFT;  Surgeon: Newman Pies, MD;  Location: [place];  Service: Neurosurgery;  Laterality: Left;  LAMINECTOMY FOR FACET/SYNOVIAL CYST LUMBAR THREE - LUMBAR FOUR LEFT    Allergies Isoniazid and Keflex [cephalexin]  Social History Social History   Tobacco Use  . Smoking status: Never Smoker   . Smokeless tobacco: Never Used  Substance Use Topics  . Alcohol use: No  . Drug use: No   Review of Systems Constitutional: Negative for fever. Cardiovascular: Negative for chest pain. Respiratory: Positive for shortness of breath with productive cough Gastrointestinal: Negative for abdominal pain, vomiting and diarrhea. Musculoskeletal: Negative for back pain. Skin: Negative for rash. Neurological: Positive for weakness  All systems negative/normal/unremarkable except as stated in the HPI  ____________________________________________   PHYSICAL EXAM:  VITAL SIGNS: ED Triage Vitals [09/01/18 1017]  Enc Vitals Group     BP 133/72     Pulse Rate 69     Resp (!) 22     Temp 98.3 F (36.8 C)     Temp Source Oral     SpO2 98 %     Weight 160 lb (72.6 kg)     Height _0  (1.702 m)     Head Circumference      Peak Flow      Pain Score 0     Pain Loc      Pain Edu?      Excl. in [place]?    Constitutional: Alert and oriented. Well appearing and in no distress. Eyes: Conjunctivae are normal. Normal extraocular movements. ENT      Head: Normocephalic and atraumatic.      Nose: No congestion/rhinnorhea.      Mouth/Throat: Mucous membranes are moist.      Neck: No stridor. Cardiovascular: Normal rate, regular rhythm. No murmurs, rubs, or gallops. Respiratory: Normal respiratory effort without tachypnea nor retractions. Breath sounds are clear and equal bilaterally. No wheezes/rales/rhonchi. Gastrointestinal: Soft and nontender. Normal bowel sounds Musculoskeletal: Nontender with normal range of motion in extremities. No lower extremity tenderness nor edema. Neurologic:  Normal speech and language. No gross focal neurologic deficits are appreciated.  Skin:  Skin is warm, dry and intact. No rash noted. Psychiatric: Mood and affect are normal. Speech and behavior are normal.  ____________________________________________  EKG: Interpreted by me.  Sinus rhythm with rate of 69  bpm, left anterior fascicular block, normal QT  ____________________________________________  ED COURSE:  As part of my medical decision making, I reviewed the following data within the [place] History obtained from family if available, nursing notes, old chart and ekg, as well as notes from prior ED visits. Patient presented for syncope, we will assess with labs and imaging as indicated at this time.   Procedures  Sydney Gillespie was evaluated in Emergency Department on 09/01/2018 for the symptoms described in the history of present illness. She was evaluated in the context of the global COVID-19 pandemic, which necessitated consideration that the patient might be at risk for infection with the SARS-CoV-2 virus that causes COVID-19. Institutional protocols and algorithms that pertain to the evaluation of patients at risk for COVID-19 are in a state of rapid change based on information released by regulatory bodies including the CDC and federal and state organizations. These policies and algorithms were followed during the patient's care in the ED.  ____________________________________________  LABS (pertinent positives/negatives)  Labs Reviewed  BASIC METABOLIC PANEL - Abnormal; Notable for the following components:      Result Value   Sodium 132 (*)    CO2 15 (*)    BUN 60 (*)    Creatinine, Ser 2.59 (*)    Calcium 8.8 (*)    GFR calc non Af Amer 17 (*)    GFR calc Af Amer 20 (*)    All other components within normal limits  CBC - Abnormal; Notable for the following components:   Platelets 120 (*)    All other components within normal limits  GLUCOSE, CAPILLARY  URINALYSIS, COMPLETE (UACMP) WITH MICROSCOPIC  TROPONIN I   CRITICAL CARE Performed by: Laurence Aly   Total critical care time: 15 minutes  Critical care time was exclusive of separately billable procedures and treating other patients.  Critical care was necessary to treat or prevent  imminent or life-threatening deterioration.  Critical care was time spent personally by me on the following activities: development of treatment plan with patient and/or surrogate as well as nursing, discussions with consultants, evaluation of patient's response to treatment, examination of patient, obtaining history from patient or surrogate, ordering and performing treatments and interventions, ordering and review of laboratory studies, ordering and review of radiographic studies, pulse oximetry and re-evaluation of patient's condition.  RADIOLOGY Images were viewed by me  Chest x-ray IMPRESSION: Patchy and indistinct opacity in the right lung new since December compatible with COVID-19 pneumonia. ____________________________________________   DIFFERENTIAL DIAGNOSIS   Dehydration, electrolyte abnormality, anemia, coronavirus  FINAL ASSESSMENT AND PLAN  Syncope, pneumonia, acute kidney injury   Plan: The patient had presented for syncope. Patient's labs did reveal an increase in her BUN and creatinine consistent with acute kidney injury likely from dehydration, she has been started on IV fluids while in the ER. Patient's imaging did reveal a patchy and opacity in the right lung consistent with pneumonia.   Laurence Aly, MD    Note: This note was generated in part or whole with voice recognition software. Voice recognition is usually quite accurate but there are transcription errors that can and very often do occur. I apologize for any typographical errors that were not detected and corrected.     Earleen Newport, MD 09/01/18 [phone]

## 2018-09-01 NOTE — ED Notes (Signed)
ED Provider at bedside. 

## 2018-09-01 NOTE — ED Notes (Signed)
Pt left with [facility] 

## 2018-09-01 NOTE — ED Notes (Signed)
With patient permission, husband of patient updated as to where patient is being transferred to.

## 2018-09-01 NOTE — ED Triage Notes (Signed)
Arrives by EMS from home. Pt had near syncopal episode today. Pt confirmed positive COVID that resulted Monday, tested on Friday. Thick, productive cough. Pt denies falling with near syncopal episode today. Generalized weakness. Pt alert and oriented X4, active, cooperative, pt in NAD. RR even and unlabored, color WNL.

## 2018-09-01 NOTE — ED Notes (Signed)
Called to inquire status of add on troponin, states that they will add on.

## 2018-09-01 NOTE — ED Notes (Addendum)
EMTALA reviewed by this RN.  

## 2018-09-01 NOTE — ED Notes (Signed)
[facility] at bedside 

## 2018-09-01 NOTE — ED Notes (Signed)
[facility], Doug, called with bed assignment for patient to [place]  1312

## 2018-09-02 DIAGNOSIS — J181 Lobar pneumonia, unspecified organism: Secondary | ICD-10-CM

## 2018-09-02 LAB — CBC
HCT: 31.8 % — ABNORMAL LOW (ref 36.0–46.0)
Hemoglobin: 9.9 g/dL — ABNORMAL LOW (ref 12.0–15.0)
MCH: 30.7 pg (ref 26.0–34.0)
MCHC: 31.1 g/dL (ref 30.0–36.0)
MCV: 98.5 fL (ref 80.0–100.0)
Platelets: 90 10*3/uL — ABNORMAL LOW (ref 150–400)
RBC: 3.23 MIL/uL — ABNORMAL LOW (ref 3.87–5.11)
RDW: 13.3 % (ref 11.5–15.5)
WBC: 3.6 10*3/uL — ABNORMAL LOW (ref 4.0–10.5)
nRBC: 0 % (ref 0.0–0.2)

## 2018-09-02 LAB — BASIC METABOLIC PANEL
Anion gap: 10 (ref 5–15)
BUN: 48 mg/dL — ABNORMAL HIGH (ref 8–23)
CO2: 12 mmol/L — ABNORMAL LOW (ref 22–32)
Calcium: 7.8 mg/dL — ABNORMAL LOW (ref 8.9–10.3)
Chloride: 112 mmol/L — ABNORMAL HIGH (ref 98–111)
Creatinine, Ser: 2.04 mg/dL — ABNORMAL HIGH (ref 0.44–1.00)
GFR calc Af Amer: 26 mL/min — ABNORMAL LOW (ref >60)
GFR calc non Af Amer: 23 mL/min — ABNORMAL LOW (ref >60)
Glucose, Bld: 69 mg/dL — ABNORMAL LOW (ref 70–99)
Potassium: 3.5 mmol/L (ref 3.5–5.1)
Sodium: 134 mmol/L — ABNORMAL LOW (ref 135–145)

## 2018-09-02 LAB — PROCALCITONIN: Procalcitonin: <0.1 ng/mL

## 2018-09-02 LAB — C-REACTIVE PROTEIN: CRP: 2.8 mg/dL — ABNORMAL HIGH (ref <1.0)

## 2018-09-02 LAB — D-DIMER, QUANTITATIVE: D-Dimer, Quant: 1.25 ug/mL-FEU — ABNORMAL HIGH (ref 0.00–0.50)

## 2018-09-02 LAB — FERRITIN: Ferritin: 646 ng/mL — ABNORMAL HIGH (ref 11–307)

## 2018-09-02 MED ORDER — SODIUM CHLORIDE 0.9 % IV BOLUS
500.0000 mL | Freq: Once | INTRAVENOUS | Status: AC
  Administered 2018-09-02: 500 mL via INTRAVENOUS

## 2018-09-02 MED ORDER — HYDROCORTISONE NA SUCCINATE PF 100 MG IJ SOLR
100.0000 mg | Freq: Two times a day (BID) | INTRAMUSCULAR | Status: DC
  Administered 2018-09-02 – 2018-09-03 (×3): 100 mg via INTRAVENOUS
  Filled 2018-09-02 (×3): qty 2

## 2018-09-02 NOTE — Progress Notes (Addendum)
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 Precious Reel [facility]um [facility]--diagnosed ~ 2011  HTN, HLD, chronic kidney disease IV, Barrett's esophagus, hypothyroidism.   checked for COVID-19 a week ago on Friday 4/17 as an outpatient.  She got the positive result of that on Monday 4/20.  ?  worsening productive cough +generalized weakness, dehydration.  syncopal episode, slumped to the floor. ? if she lost consciousness.    [facility] ED,   temperature 98.5, breathing comfortably on room air, blood pressure 131/57.  sodium level 132, potassium 4, serum bicarb 15,  BUN/creatinine 60/2.59, baseline creatinine of 2 , troponin negative at 0.03,  WBC count normal 5.7, hemoglobin 12.4,  platelet slightly low at 120.  Chest x-ray showed patchy and indistinct opacity in the right lung new since December compatible with COVID-19 pneumonia.     A & Plan COVID + covid Secondary PNA Sepsis secondary to above Cytokine labs seems stable at this time She however feels weak and "tired" Monitor carefully on telemetry for decompensation Blood pressure is low and she has mild AKI so would continue current management--we will add Cortef 100 q12 2/2 low blood pressure Giving 500 cc bolus in addition to rate of 100 cc an hour Currently +1.4 L since admission Place Foley catheter MM s/p [place] transplant--in remission since 2013 after stem cell transplant  Not on any therapy for this at this time Outpatient follow-up Hypertension Blood pressure is low holding atenolol 50 daily, Aldactone 25 daily, Maxide Continue fluids as above RH Arthrtiis Continuing Plaquenil which is a prior to admission meds Holding Arava Stress dose steroids Cortef as above AKI+Met  acid Resolving slowly Acidosis still present CO2 is 12 BUN/creatinine 48/2.0 slightly improved from prior to admission Careful monitoring I's and O's Foley placed   DVT lovenox  Code Status: full  Communication: none--she wishes to update them herself  Disposition Plan:  inpatient    Verlon Au, MD  [facility] Via [employer] app OR [email or web address] 7PM-7AM contact night coverage as above 09/02/2018, 8:48 AM  LOS: 1 day   Consultants:  none  Procedures:  n  Antimicrobials:  n  Interval history/Subjective: Feels some better than prior-still a little weak and coughing a lot Some lightheadedness this am on getting up + N No CP NO fever Diarrhea + this am x 2  No leg swelling nor arm swelling  Objective:  Vitals:  Vitals:   09/01/18 1537  BP: (!) 131/57  Resp: 20  Temp: 98.5 F (36.9 C)  SpO2: 98%    Exam:   awake alert tired listless CF No ict no pallor TVR + TVF increased in R post lung fields abd soft nt nd no rebound no gaurd No le edema Neuro intact with no focal deficit    I have personally reviewed the following:  DATA   Labs:  60/2.5--->40/2.04  ferritin--621--->646  CRP stable 2.8  PCT < 0.10  co2 12, ldh 18  PLT 120--->90, Dimer 1.3-->1.2   Scheduled Meds: . enoxaparin (LOVENOX) injection  30 mg Subcutaneous Q24H  . feeding supplement (ENSURE ENLIVE)  237 mL Oral BID BM  . hydrocortisone sod succinate (SOLU-CORTEF) inj  100 mg Intravenous Q12H  . hydroxychloroquine  200 mg Oral BID  . senna  1 tablet Oral BID  . sodium chloride flush  3 mL Intravenous Q12H  . vitamin C  500 mg Oral Daily  . zinc sulfate  220 mg Oral Daily   Continuous Infusions: . sodium chloride 100 mL/hr at 09/01/18 1840    Active Problems:   Syncope   Pneumonia due to COVID-19 virus   LOS: 1 day

## 2018-09-02 NOTE — Progress Notes (Signed)
Initial Nutrition Assessment RD working remotely.  DOCUMENTATION CODES:   Not applicable  INTERVENTION:  - continue Ensure Enlive BID, each supplement provides 350 kcal and 20 grams of protein - continue to encourage PO intakes.Marland Kitchen    NUTRITION DIAGNOSIS:   Increased nutrient needs related to acute illness as evidenced by estimated needs.  GOAL:   Patient will meet greater than or equal to 90% of their needs  MONITOR:   PO intake, Supplement acceptance, Labs, Weight trends  REASON FOR ASSESSMENT:   Malnutrition Screening Tool  ASSESSMENT:   78 year old female with medical hx which includes multiple myeloma which is currently in remission, HTN, and RA. She was admitted from home via EMS following a near syncopal episode. Patient tested and found to be positive for COVID-19 on 4/17.  BMI indicates overweight status. No PO intakes documented since admission. Ensure Enlive was ordered BID per ONS protocol at the time of admission and patient accepted the bottle offered to her earlier today. Patient was found to be COVID positive PTA (1 week ago).  Per chart review, current weight is 160 lb and weight on 4/3 at [employer] was 165 lb. This indicates 5 lb weight loss (3% body weight) in the past 3 weeks; not significant for time frame but continue to monitor weight trends closely.   She reports being unsure about weight changes but that patient has decreased over the past ~2 weeks d/t feeling unwell. Decrease in appetite has led to a lack of desire to eat and she has been eating less than usual in the past few weeks.   Medications reviewed; 100 mg solu-cortef BID, 1 tablet senokot BID, 500 mg ascorbic acid/day, 220 mg zinc sulfate/day.  Labs reviewed; Na: 134 mmol/l, Cl: 112 mmol/l, BUN: 48 mg/dl, creatinine: 2.04 mg/dl, Ca: 7.8 mg/dl, GFR: 23 ml/min.  IVF; NS @ 100 ml/hr.     NUTRITION - FOCUSED PHYSICAL EXAM:  unable to complete at this time.   Diet Order:   Diet Order             Diet Heart Room service appropriate? Yes; Fluid consistency: Thin  Diet effective now              EDUCATION NEEDS:   No education needs have been identified at this time  Skin:  Skin Assessment: Reviewed RN Assessment  Last BM:  4/24  Height:   Ht Readings from Last 1 Encounters:  09/01/18 '5\' 7"'  (1.702 m)    Weight:   Wt Readings from Last 1 Encounters:  09/01/18 72.6 kg    Ideal Body Weight:  61.4 kg  BMI:  Body mass index is 25.06 kg/m.  Estimated Nutritional Needs:   Kcal:  1700-1900 kcal  Protein:  70-85 grams  Fluid:  >/= 1.8 L/day      Jarome Matin, MS, RD, LDN, [facility] Inpatient Clinical Dietitian Pager # [phone] After hours/weekend pager # [phone]

## 2018-09-02 NOTE — Progress Notes (Signed)
addend  Saw patient on leaving [place] reported got up and got to [place].  BP on Dinamap 245 systolic  Verneita Griffes, MD [facility] 5:17 PM

## 2018-09-03 ENCOUNTER — Inpatient Hospital Stay (HOSPITAL_COMMUNITY): Payer: Medicare Other

## 2018-09-03 DIAGNOSIS — N179 Acute kidney failure, unspecified: Secondary | ICD-10-CM

## 2018-09-03 DIAGNOSIS — I10 Essential (primary) hypertension: Secondary | ICD-10-CM

## 2018-09-03 DIAGNOSIS — R55 Syncope and collapse: Secondary | ICD-10-CM

## 2018-09-03 DIAGNOSIS — J189 Pneumonia, unspecified organism: Secondary | ICD-10-CM

## 2018-09-03 LAB — COMPREHENSIVE METABOLIC PANEL
ALT: 9 U/L (ref 0–44)
AST: 26 U/L (ref 15–41)
Albumin: 3.2 g/dL — ABNORMAL LOW (ref 3.5–5.0)
Alkaline Phosphatase: 53 U/L (ref 38–126)
Anion gap: 7 (ref 5–15)
BUN: 38 mg/dL — ABNORMAL HIGH (ref 8–23)
CO2: 15 mmol/L — ABNORMAL LOW (ref 22–32)
Calcium: 8.1 mg/dL — ABNORMAL LOW (ref 8.9–10.3)
Chloride: 113 mmol/L — ABNORMAL HIGH (ref 98–111)
Creatinine, Ser: 1.73 mg/dL — ABNORMAL HIGH (ref 0.44–1.00)
GFR calc Af Amer: 32 mL/min — ABNORMAL LOW (ref >60)
GFR calc non Af Amer: 28 mL/min — ABNORMAL LOW (ref >60)
Glucose, Bld: 118 mg/dL — ABNORMAL HIGH (ref 70–99)
Potassium: 3.6 mmol/L (ref 3.5–5.1)
Sodium: 135 mmol/L (ref 135–145)
Total Bilirubin: 0.2 mg/dL — ABNORMAL LOW (ref 0.3–1.2)
Total Protein: 5.9 g/dL — ABNORMAL LOW (ref 6.5–8.1)

## 2018-09-03 LAB — CBC
HCT: 32.2 % — ABNORMAL LOW (ref 36.0–46.0)
Hemoglobin: 10.1 g/dL — ABNORMAL LOW (ref 12.0–15.0)
MCH: 30.4 pg (ref 26.0–34.0)
MCHC: 31.4 g/dL (ref 30.0–36.0)
MCV: 97 fL (ref 80.0–100.0)
Platelets: 95 10*3/uL — ABNORMAL LOW (ref 150–400)
RBC: 3.32 MIL/uL — ABNORMAL LOW (ref 3.87–5.11)
RDW: 13.4 % (ref 11.5–15.5)
WBC: 1.9 10*3/uL — ABNORMAL LOW (ref 4.0–10.5)
nRBC: 0 % (ref 0.0–0.2)

## 2018-09-03 LAB — PROCALCITONIN: Procalcitonin: <0.1 ng/mL

## 2018-09-03 LAB — D-DIMER, QUANTITATIVE: D-Dimer, Quant: 1.62 ug/mL-FEU — ABNORMAL HIGH (ref 0.00–0.50)

## 2018-09-03 LAB — MAGNESIUM: Magnesium: 1.7 mg/dL (ref 1.7–2.4)

## 2018-09-03 LAB — FERRITIN: Ferritin: 668 ng/mL — ABNORMAL HIGH (ref 11–307)

## 2018-09-03 LAB — C-REACTIVE PROTEIN: CRP: 3.5 mg/dL — ABNORMAL HIGH (ref <1.0)

## 2018-09-03 MED ORDER — HYDROCORTISONE NA SUCCINATE PF 100 MG IJ SOLR
50.0000 mg | Freq: Two times a day (BID) | INTRAMUSCULAR | Status: DC
  Administered 2018-09-03: 50 mg via INTRAVENOUS
  Filled 2018-09-03: qty 2

## 2018-09-03 MED ORDER — ENOXAPARIN SODIUM 30 MG/0.3ML ~~LOC~~ SOLN
30.0000 mg | SUBCUTANEOUS | Status: DC
  Administered 2018-09-03 – 2018-09-05 (×3): 30 mg via SUBCUTANEOUS
  Filled 2018-09-03 (×3): qty 0.3

## 2018-09-03 MED ORDER — MIDODRINE HCL 5 MG PO TABS
5.0000 mg | ORAL_TABLET | Freq: Three times a day (TID) | ORAL | Status: DC
  Administered 2018-09-04: 5 mg via ORAL
  Filled 2018-09-03 (×3): qty 1

## 2018-09-03 MED ORDER — SODIUM CHLORIDE 0.9 % IV SOLN
Freq: Once | INTRAVENOUS | Status: AC
  Administered 2018-09-03: 16:00:00 via INTRAVENOUS

## 2018-09-03 MED ORDER — PANTOPRAZOLE SODIUM 40 MG PO TBEC
40.0000 mg | DELAYED_RELEASE_TABLET | Freq: Every day | ORAL | Status: DC
  Administered 2018-09-03 – 2018-09-05 (×3): 40 mg via ORAL
  Filled 2018-09-03 (×3): qty 1

## 2018-09-03 MED ORDER — SODIUM BICARBONATE 650 MG PO TABS
650.0000 mg | ORAL_TABLET | Freq: Two times a day (BID) | ORAL | Status: DC
  Administered 2018-09-03: 650 mg via ORAL
  Filled 2018-09-03 (×3): qty 1

## 2018-09-03 MED ORDER — ONDANSETRON HCL 4 MG/2ML IJ SOLN
4.0000 mg | Freq: Four times a day (QID) | INTRAMUSCULAR | Status: DC | PRN
  Administered 2018-09-03: 4 mg via INTRAVENOUS
  Filled 2018-09-03: qty 2

## 2018-09-03 NOTE — Progress Notes (Addendum)
PROGRESS NOTE                                                                                                                                                                                                             Patient Demographics:    Sydney Gillespie, is a 78 y.o. female, DOB - [birth year], BSW:[id number]  Outpatient Primary MD for the patient is [name], MD    LOS - 2  No chief complaint on file.      Brief Narrative: Patient is a 78 y.o. female with PMHx of multiple myeloma s/p stem cell transplant currently in remission, rheumatoid arthritis, CKD stage IV, hypothyroidism-who presented with 1 week history of cough, presyncope, worsening shortness of breath found to have mild acute hypoxic respiratory failure in the setting of COVID-19 viral pneumonia.   Subjective:    Sydney Gillespie today feels better than yesterday-she remains on 2 L of oxygen.  She is not in any obvious distress.  Claims that her breathing feels better than yesterday.  She continues to have episodes of coughing spells.   Assessment  & Plan :   Acute hypoxemic respiratory failure in the setting of COVID-19 of viral pneumonia: Continue supportive care-on 2 L of oxygen-and not in any distress.  Will ask nursing staff to slowly titrate down oxygen to room air.  Sepsis secondary to above: Sepsis pathophysiology has improved-no longer hypotensive.  Empirically started on stress dose hydrocortisone yesterday-we will start tapering today.  No cultures obtained on admission-given improvement and lack of fever-suspect we can just watch her, if patient starts spiking fevers or worsens hemodynamically-we can think about getting culture data.  AKI on CKD stage IV: AKI likely hemodynamically mediated-with supportive care-creatinine is now back to baseline.  Mild metabolic acidosis: Likely secondary to above-we will start bicarb supplementation.   Presyncope: Per history-patient never lost consciousness-suspect she had presyncopal episode-possibly secondary from orthostatic hypotension or vasovagal (occurred after micturition).  Troponins were negative.  Ambulate with physical therapy.  Thrombocytopenia/leukopenia: Appears to be mild-most likely related to ongoing COVID-19 infection-should improve with time and supportive care.  She does have a history of multiple myeloma-on discharge she will need to follow-up with a hematologist to ensure that these labs have normalized.  Rheumatoid arthritis: Continue Plaquenil-Arava on hold until she gets over acute illness.  Hypertension: Patient now stable-continue  to hold all antihypertensives for now.  History of multiple myeloma s/p stem cell transplant: Resume follow-up with primary hematologist on discharge.  Condition -  Guarded  Family Communication  : None at bedside  Code Status : Full code  Disposition Plan  : Remain inpatient-we will attempt to titrate off oxygen-mobilize with physical therapy-hopefully home in the next day or so if clinical improvement continues.  Consults  : None  Procedures  :  None  DVT Prophylaxis  :  Lovenox   Lab Results  Component Value Date   PLT 95 (L) 09/03/2018    Inpatient Medications  Scheduled Meds: . feeding supplement (ENSURE ENLIVE)  237 mL Oral BID BM  . hydrocortisone sod succinate (SOLU-CORTEF) inj  100 mg Intravenous Q12H  . hydroxychloroquine  200 mg Oral BID  . senna  1 tablet Oral BID  . sodium chloride flush  3 mL Intravenous Q12H  . vitamin C  500 mg Oral Daily  . zinc sulfate  220 mg Oral Daily   Continuous Infusions: . sodium chloride Stopped (09/03/18 0944)   PRN Meds:.acetaminophen **OR** acetaminophen, chlorpheniramine-HYDROcodone, guaiFENesin-dextromethorphan, sodium phosphate, sorbitol  Antibiotics  :    Anti-infectives (From admission, onward)   Start     Dose/Rate Route Frequency Ordered Stop   09/01/18 2200   hydroxychloroquine (PLAQUENIL) tablet 200 mg     200 mg Oral 2 times daily 09/01/18 1840         Time Spent in minutes  25   Oren Binet M.D on 09/03/2018 at 9:57 AM  To page go to [email or web address] - password [facility]  [facility] -  Office  [phone]   Admit date - 09/01/2018    2    Objective:   Vitals:   09/03/18 0000 09/03/18 0100 09/03/18 0200 09/03/18 0411  BP: 139/60 (!) 148/61 (!) 141/62 (!) 151/67  Pulse: (!) 59 61 (!) 57   Resp: '14 17 15   ' Temp:    (!) 97.5 F (36.4 C)  TempSrc:    Oral  SpO2: 98% 99% 98%   Weight:      Height:        Wt Readings from Last 3 Encounters:  09/02/18 72.6 kg  09/01/18 72.6 kg  06/07/18 72.6 kg     Intake/Output Summary (Last 24 hours) at 09/03/2018 0957 Last data filed at 09/03/2018 0800 Gross per 24 hour  Intake 1200 ml  Output 1575 ml  Net -375 ml     Physical Exam General appearance:Awake, alert, not in any distress.  Eyes:no scleral icterus. HEENT: Atraumatic and Normocephalic Neck: supple, no JVD. Resp:Good air entry bilaterally,no rales or rhonchi CVS: S1 S2 regular GI: Bowel sounds present, Non tender and not distended with no gaurding, rigidity or rebound. Extremities: B/L Lower Ext shows no edema, both legs are warm to touch Neurology:  Non focal Psychiatric: Normal judgment and insight. Normal mood. Musculoskeletal:No digital cyanosis Skin:No Rash, warm and dry Wounds:N/A   Data Review:    CBC Recent Labs  Lab 09/01/18 1024 09/02/18 0411 09/03/18 0355  WBC 5.7 3.6* 1.9*  HGB 12.4 9.9* 10.1*  HCT 37.6 31.8* 32.2*  PLT 120* 90* 95*  MCV 94.5 98.5 97.0  MCH 31.2 30.7 30.4  MCHC 33.0 31.1 31.4  RDW 13.3 13.3 13.4    Chemistries  Recent Labs  Lab 09/01/18 1024 09/02/18 0411 09/03/18 0355  NA 132* 134* 135  K 4.0 3.5 3.6  CL 103 112* 113*  CO2 15* 12* 15*  GLUCOSE 97 69* 118*  BUN 60* 48* 38*  CREATININE 2.59* 2.04* 1.73*  CALCIUM 8.8* 7.8* 8.1*  MG  --   --  1.7  AST  --    --  26  ALT  --   --  9  ALKPHOS  --   --  53  BILITOT  --   --  0.2*   ------------------------------------------------------------------------------------------------------------------ No results for input(s): CHOL, HDL, LDLCALC, TRIG, CHOLHDL, LDLDIRECT in the last 72 hours.  No results found for: HGBA1C ------------------------------------------------------------------------------------------------------------------ No results for input(s): TSH, T4TOTAL, T3FREE, THYROIDAB in the last 72 hours.  Invalid input(s): FREET3 ------------------------------------------------------------------------------------------------------------------ Recent Labs    09/02/18 0411 09/03/18 0355  FERRITIN 646* 668*    Coagulation profile No results for input(s): INR, PROTIME in the last 168 hours.  Recent Labs    09/02/18 0411 09/03/18 0355  DDIMER 1.25* 1.62*    Cardiac Enzymes Recent Labs  Lab 09/01/18 1024 09/01/18 1632  TROPONINI <0.03 <0.03   ------------------------------------------------------------------------------------------------------------------ No results found for: BNP  Micro Results No results found for this or any previous visit (from the past 240 hour(s)).  Radiology Reports Dg Chest 1 View  Result Date: 09/01/2018 CLINICAL DATA:  78 year old female with syncope today. Reports positive for COVID-19. Cough and shortness of breath. EXAM: CHEST  1 VIEW COMPARISON:  Chest radiographs 04/12/2018. FINDINGS: Portable AP upright view at 1037 hours. Peripheral patchy and indistinct opacity in the right upper lobe is new since December. Questionable mildly increased interstitial markings also at the right lung base. Chronic underlying pulmonary interstitial prominence and large lung volumes. Mediastinal contours remain normal. No pneumothorax or pleural effusion. Prior cervical ACDF. No acute osseous abnormality identified. IMPRESSION: Patchy and indistinct opacity in the  right lung new since December compatible with COVID-19 pneumonia. Electronically Signed   By: Genevie Ann M.D.   On: 09/01/2018 11:04   Dg Chest Port 1 View  Result Date: 09/03/2018 CLINICAL DATA:  Cold and positive patient. Cough and shortness of breath. EXAM: PORTABLE CHEST 1 VIEW COMPARISON:  September 01, 2018 FINDINGS: Opacity in the right lateral lung persists, similar to slightly more prominent the interval. No other pulmonary infiltrates noted. The heart, hila, mediastinum, lungs, and pleura are otherwise unremarkable. IMPRESSION: 1. Focal infiltrate in the right lateral mid lung, stable to mildly more prominent the interval. No other changes. Electronically Signed   By: Dorise Bullion III M.D   On: 09/03/2018 06:24

## 2018-09-03 NOTE — Progress Notes (Addendum)
At 64, Sydney Binet, MD was paged regarding the pt's orthostatic vitals. Lying BP 112/54 pulse 59, sitting 97/60 pulse 58, standing 88/46 pulse 59. Pt became dizzy as she stood and was put back to bed.    MD was paged again and gave verbal orders for NS 50 ml/hr for 12 hours.

## 2018-09-03 NOTE — Progress Notes (Signed)
At [place], Oren Binet, MD was paged regarding possibly removing the pt's foley cath and a PT consult d/t her syncopal episode a few days ago.   The MD returned my page and put in orders for the requests listed above.

## 2018-09-03 NOTE — Progress Notes (Addendum)
At 1800, the pt was assisted to the [facility]. While the pt was using the bathroom, she c/o dizziness. The pt was assisted back to bed and her BP was taken. BP was 98/84, pulse 69. Oren Binet, MD was notified.   Oren Binet, MD returned my page and gave verbal orders for Midodrine 5mg  tablet PO TID.

## 2018-09-03 NOTE — Progress Notes (Addendum)
At 1200, Oren Binet, MD was paged regarding the pt's c/o nausea.   MD gave verbal orders for IV Zofran 4 mg q 6 PRN.

## 2018-09-03 NOTE — Progress Notes (Signed)
States she is unable to tolerate lying on her stomach

## 2018-09-03 NOTE — Evaluation (Signed)
Physical Therapy Evaluation Patient Details Name: Sydney Gillespie MRN: [id number] DOB: 1940/07/25 Today's Date: 09/03/2018   History of Present Illness  Patient is a 78 y.o. female with PMHx of multiple myeloma  rheumatoid arthritis, anemia, GERD, hyperlipidemia, hypothyroidism,  who presents to the ED 09/01/18 for near syncope today.  Patient confirmed positive COVID test that resulted on Monday and she was tested on Friday  Clinical Impression  The patient reports having been ambulating ro BR without physical assistance. Patient's orthostatic BP taken. See doc flowsheets at 1314. Patient reports dizziness. Noted drop in BP and patient unable to tolerate 3 minute BP. RN notified.   Pt admitted with above diagnosis. Pt currently with functional limitations due to the deficits listed below (see PT Problem List). Pt will benefit from skilled PT to increase their independence and safety with mobility to allow discharge to the venue listed below.       Follow Up Recommendations No PT follow up    Equipment Recommendations  None recommended by PT    Recommendations for Other Services       Precautions / Restrictions Precautions Precautions: Fall Precaution Comments:  take orthostatic BPs      Mobility  Bed Mobility Overal bed mobility: Needs Assistance Bed Mobility: Supine to Sit;Sit to Supine     Supine to sit: Independent Sit to supine: Independent      Transfers Overall transfer level: Needs assistance Equipment used: 1 person hand held assist Transfers: Sit to/from Stand Sit to Stand: Min assist         General transfer comment: steady assist to rise as patient reports feeling dizzy  Ambulation/Gait             General Gait Details: NT due to orthostaic, has ambualted to BR with 1 Supervision per patient  Stairs            Wheelchair Mobility    Modified Rankin (Stroke Patients Only)       Balance                                              Pertinent Vitals/Pain Pain Assessment: No/denies pain    Home Living Family/patient expects to be discharged to:: Private residence Living Arrangements: Spouse/significant other;Other relatives Available Help at Discharge: Family Type of Home: House Home Access: Stairs to enter Entrance Stairs-Rails: Can reach both Entrance Stairs-Number of Steps: 6 Home Layout: Two level;Able to live on main level with bedroom/bathroom Home Equipment: None      Prior Function Level of Independence: Independent               Hand Dominance        Extremity/Trunk Assessment   Upper Extremity Assessment Upper Extremity Assessment: Generalized weakness    Lower Extremity Assessment Lower Extremity Assessment: Generalized weakness    Cervical / Trunk Assessment Cervical / Trunk Assessment: Normal  Communication   Communication: No difficulties  Cognition Arousal/Alertness: Awake/alert Behavior During Therapy: WFL for tasks assessed/performed Overall Cognitive Status: Within Functional Limits for tasks assessed                                        General Comments      Exercises     Assessment/Plan    PT  Assessment Patient needs continued PT services  PT Problem List Decreased activity tolerance;Decreased mobility       PT Treatment Interventions DME instruction;Gait training;Functional mobility training;Therapeutic activities;Patient/family education    PT Goals (Current goals can be found in the Care Plan section)  Acute Rehab PT Goals Patient Stated Goal: go home PT Goal Formulation: With patient Time For Goal Achievement: 09/17/18 Potential to Achieve Goals: Good    Frequency Min 4X/week   Barriers to discharge        Co-evaluation               AM-PAC PT "6 Clicks" Mobility  Outcome Measure Help needed turning from your back to your side while in a flat bed without using bedrails?: None Help needed moving from  lying on your back to sitting on the side of a flat bed without using bedrails?: None Help needed moving to and from a bed to a chair (including a wheelchair)?: A Little Help needed standing up from a chair using your arms (e.g., wheelchair or bedside chair)?: A Little Help needed to walk in hospital room?: A Little Help needed climbing 3-5 steps with a railing? : A Lot 6 Click Score: 19    End of Session   Activity Tolerance: Treatment limited secondary to medical complications (Comment) Patient left: in bed;with call bell/phone within reach Nurse Communication: Mobility status(BP) PT Visit Diagnosis: Unsteadiness on feet (R26.81)    Time: 1310-1340 PT Time Calculation (min) (ACUTE ONLY): 30 min   Charges:   PT Evaluation $PT Eval Low Complexity: 1 Low PT Treatments $Therapeutic Activity: 8-22 mins       Tresa Endo PT Acute Rehabilitation Services Pager [phone] Office [phone]  Claretha Cooper 09/03/2018, 3:28 PM

## 2018-09-04 LAB — MAGNESIUM: Magnesium: 1.5 mg/dL — ABNORMAL LOW (ref 1.7–2.4)

## 2018-09-04 LAB — CBC
HCT: 30.1 % — ABNORMAL LOW (ref 36.0–46.0)
Hemoglobin: 9.5 g/dL — ABNORMAL LOW (ref 12.0–15.0)
MCH: 30.9 pg (ref 26.0–34.0)
MCHC: 31.6 g/dL (ref 30.0–36.0)
MCV: 98 fL (ref 80.0–100.0)
Platelets: 100 10*3/uL — ABNORMAL LOW (ref 150–400)
RBC: 3.07 MIL/uL — ABNORMAL LOW (ref 3.87–5.11)
RDW: 13.7 % (ref 11.5–15.5)
WBC: 5.6 10*3/uL (ref 4.0–10.5)
nRBC: 0 % (ref 0.0–0.2)

## 2018-09-04 LAB — BASIC METABOLIC PANEL
Anion gap: 9 (ref 5–15)
BUN: 33 mg/dL — ABNORMAL HIGH (ref 8–23)
CO2: 14 mmol/L — ABNORMAL LOW (ref 22–32)
Calcium: 7.9 mg/dL — ABNORMAL LOW (ref 8.9–10.3)
Chloride: 112 mmol/L — ABNORMAL HIGH (ref 98–111)
Creatinine, Ser: 1.65 mg/dL — ABNORMAL HIGH (ref 0.44–1.00)
GFR calc Af Amer: 34 mL/min — ABNORMAL LOW (ref >60)
GFR calc non Af Amer: 29 mL/min — ABNORMAL LOW (ref >60)
Glucose, Bld: 122 mg/dL — ABNORMAL HIGH (ref 70–99)
Potassium: 3.3 mmol/L — ABNORMAL LOW (ref 3.5–5.1)
Sodium: 135 mmol/L (ref 135–145)

## 2018-09-04 LAB — C-REACTIVE PROTEIN: CRP: 1.1 mg/dL — ABNORMAL HIGH (ref <1.0)

## 2018-09-04 LAB — D-DIMER, QUANTITATIVE: D-Dimer, Quant: 2.97 ug/mL-FEU — ABNORMAL HIGH (ref 0.00–0.50)

## 2018-09-04 LAB — FERRITIN: Ferritin: 575 ng/mL — ABNORMAL HIGH (ref 11–307)

## 2018-09-04 MED ORDER — POTASSIUM CHLORIDE CRYS ER 20 MEQ PO TBCR
40.0000 meq | EXTENDED_RELEASE_TABLET | Freq: Once | ORAL | Status: AC
  Administered 2018-09-04: 40 meq via ORAL
  Filled 2018-09-04: qty 2

## 2018-09-04 MED ORDER — POTASSIUM CHLORIDE CRYS ER 20 MEQ PO TBCR
40.0000 meq | EXTENDED_RELEASE_TABLET | Freq: Once | ORAL | Status: AC
  Administered 2018-09-04: 11:00:00 40 meq via ORAL
  Filled 2018-09-04: qty 2

## 2018-09-04 MED ORDER — SODIUM BICARBONATE 650 MG PO TABS
1300.0000 mg | ORAL_TABLET | Freq: Two times a day (BID) | ORAL | Status: DC
  Administered 2018-09-04: 1300 mg via ORAL
  Filled 2018-09-04 (×4): qty 2

## 2018-09-04 MED ORDER — SODIUM CHLORIDE 0.9 % IV SOLN: INTRAVENOUS | Status: AC

## 2018-09-04 MED ORDER — MAGNESIUM SULFATE 4 GM/100ML IV SOLN
4.0000 g | Freq: Once | INTRAVENOUS | Status: AC
  Administered 2018-09-04: 4 g via INTRAVENOUS
  Filled 2018-09-04: qty 100

## 2018-09-04 MED ORDER — HYDROCORTISONE NA SUCCINATE PF 100 MG IJ SOLR
25.0000 mg | Freq: Two times a day (BID) | INTRAMUSCULAR | Status: AC
  Administered 2018-09-04 (×2): 25 mg via INTRAVENOUS
  Filled 2018-09-04 (×3): qty 2

## 2018-09-04 MED ORDER — MIDODRINE HCL 5 MG PO TABS
5.0000 mg | ORAL_TABLET | Freq: Two times a day (BID) | ORAL | Status: DC
  Administered 2018-09-04 – 2018-09-05 (×2): 5 mg via ORAL
  Filled 2018-09-04 (×5): qty 1

## 2018-09-04 NOTE — Progress Notes (Signed)
Pt QTc >500. EKG preformed. MD aware

## 2018-09-04 NOTE — TOC Initial Note (Addendum)
Transition of Care [facility]) - Initial/Assessment Note    Patient Details  Name: Sydney Gillespie MRN: [id number] Date of Birth: 1940/09/22  [facility] CM/SW Contact:    Maryclare Labrador, RN Phone Number: 09/04/2018, 10:49 AM  Clinical Narrative:       PTA independent from home with husband - pt denied needing DME and HH.  Pt informed CM that she has been ambulatory while hospitalized. Pt is high risk readmission ; pt has CA and recent mechanical fall.   Pt has PCP and denied barriers with paying for medications as prescribed.  CM will continue to follow for discharge needs            Expected Discharge Plan: Home/Self Care     Patient Goals and CMS Choice        Expected Discharge Plan and Services Expected Discharge Plan: Home/Self Care       Living arrangements for the past 2 months: Single Family Home Expected Discharge Date: (unknown)                                    Prior Living Arrangements/Services Living arrangements for the past 2 months: Single Family Home Lives with:: Spouse   Do you feel safe going back to the place where you live?: Yes      Need for Family Participation in Patient Care: No (Comment) Care giver support system in place?: Yes (comment)(husband)      Activities of Daily Living Home Assistive Devices/Equipment: Contact lenses ADL Screening (condition at time of admission) Patient's cognitive ability adequate to safely complete daily activities?: Yes Is the patient deaf or have difficulty hearing?: No Does the patient have difficulty seeing, even when wearing glasses/contacts?: No Does the patient have difficulty concentrating, remembering, or making decisions?: No Patient able to express need for assistance with ADLs?: Yes Does the patient have difficulty dressing or bathing?: No Independently performs ADLs?: Yes (appropriate for developmental age) Does the patient have difficulty walking or climbing stairs?:  No Weakness of Legs: Both Weakness of Arms/Hands: None  Permission Sought/Granted Permission sought to share information with : Case Manager                Emotional Assessment   Attitude/Demeanor/Rapport: Self-Confident, Gracious Affect (typically observed): Accepting, Adaptable Orientation: : Oriented to Self, Oriented to Place, Oriented to  Time, Oriented to Situation   Psych Involvement: No (comment)  Admission diagnosis:  SYNCOPE COVID-19 VIRUS INFECTION Patient Active Problem List   Diagnosis Date Noted  . Syncope 09/01/2018  . Pneumonia due to COVID-19 virus 09/01/2018  . Anemia due to stage 3 chronic kidney disease ([place]) 12/28/2016  . Lumbar radiculopathy 06/18/2016  . Fatigue 01/29/2016  . Low serum vitamin D 01/29/2016  . Multiple myeloma not having achieved remission ([place]) 12/18/2015  . Spondylolisthesis of lumbar region 11/25/2015  . Soft tissue lesion of shoulder region 09/11/2015  . Acid reflux 09/11/2015  . Rheumatoid arthritis involving multiple joints ([place]) 09/11/2015  . 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 Degeneration of intervertebral disc of lumbar region 10/18/2013  . Neuritis or radiculitis due to rupture of lumbar intervertebral disc 10/18/2013  . Degenerative arthritis of lumbar spine 10/18/2013  . Allergic rhinitis 09/27/2013  . HLD (hyperlipidemia) 09/27/2013  . Awareness of heartbeats 09/27/2013  . Gout 09/21/2013  . Abnormal result of Mantoux test 09/21/2013  . Impaired renal function 09/21/2013  .  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  . OP (osteoporosis) 10/13/2011  . 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 Rheumatoid arthritis ([place]) 09/23/2011   PCP:  [name], MD Pharmacy:   [facility] [place], [place] - [place] [facility] [address] AT [address] [place] [facility] [place] [address] Phone: [phone] Fax: [phone]     Social Determinants of Health (SDOH)  Interventions    Readmission Risk Interventions Readmission Risk Prevention Plan 09/04/2018  Transportation Screening Complete  HRI or [place] Complete  Social Work Consult for [place] Planning/Counseling Complete  Palliative Care Screening Not Applicable  Medication Review Press photographer) Complete  Some recent data might be hidden

## 2018-09-04 NOTE — Progress Notes (Signed)
PROGRESS NOTE                                                                                                                                                                                                             Patient Demographics:    Sydney Gillespie, is a 78 y.o. female, DOB - [birth year], SNK:[id number]  Outpatient Primary MD for the patient is [name], MD    LOS - 3  No chief complaint on file.      Brief Narrative: Patient is a 78 y.o. female with PMHx of multiple myeloma s/p stem cell transplant currently in remission, rheumatoid arthritis, CKD stage IV, hypothyroidism-who presented with 1 week history of cough, presyncope, worsening shortness of breath found to have mild acute hypoxic respiratory failure in the setting of COVID-19 viral pneumonia.   Subjective:    Sydney Gillespie feels better compared to yesterday-she is not dizzy when she sits up or stands up although she remains orthostatic.   Assessment  & Plan :   Acute hypoxemic respiratory failure in the setting of COVID-19 of viral pneumonia: Improved-continue to attempt to titrate off oxygen to room air.  Continue supportive care  Sepsis secondary to above: Sepsis pathophysiology has improved-blood pressure stable.  Was empirically started on stress dose steroids-we will continue to taper-and discontinue after 2 more doses.  No cultures obtained on admission-given improvement and lack of fever-suspect we can just watch her, if patient starts spiking fevers or worsens hemodynamically-we can think about getting culture data.  AKI on CKD stage IV: AKI likely hemodynamically mediated-creatinine has significantly improved with supportive care.  Follow periodically.  Mild metabolic acidosis: Likely secondary to above-continue bicarb supplementation.  Presyncope: Likely secondary to orthostatic hypotension-she never lost consciousness.  Although  orthostatic this morning-no longer symptomatic.  Started on midodrine yesterday-we will decrease dose to twice daily dosing.  Continue gentle hydration.  Ambulate with physical therapy.   Thrombocytopenia/leukopenia: Leukopenia has resolved-thrombocytopenia slowly improving.  This is likely related to COVID-19 infection and should improve with time and supportive care.    She does have a history of multiple myeloma-on discharge she will need to follow-up with a hematologist to ensure that these labs have normalized.  Rheumatoid arthritis: Continue Plaquenil-Arava on hold until she gets over acute illness.  EKG this morning with QTC around 488.  Hypertension: Given  orthostatic hypotension-continue to hold all antihypertensives for now.  History of multiple myeloma s/p stem cell transplant: Resume follow-up with primary hematologist on discharge.  Condition -  Guarded  Family Communication  : None at bedside  Code Status : Full code  Disposition Plan  : Remain inpatient-hopefully home on 4/27 if [place] improvement continues.  Consults  : None  Procedures  :  None  DVT Prophylaxis  :  Lovenox   Lab Results  Component Value Date   PLT 100 (L) 09/04/2018    Inpatient Medications  Scheduled Meds: . enoxaparin (LOVENOX) injection  30 mg Subcutaneous Q24H  . feeding supplement (ENSURE ENLIVE)  237 mL Oral BID BM  . hydrocortisone sod succinate (SOLU-CORTEF) inj  25 mg Intravenous Q12H  . hydroxychloroquine  200 mg Oral BID  . midodrine  5 mg Oral BID WC  . pantoprazole  40 mg Oral Daily  . senna  1 tablet Oral BID  . sodium bicarbonate  650 mg Oral BID  . sodium chloride flush  3 mL Intravenous Q12H  . vitamin C  500 mg Oral Daily  . zinc sulfate  220 mg Oral Daily   Continuous Infusions: . sodium chloride 10 mL/hr at 09/04/18 0059  . sodium chloride     PRN Meds:.acetaminophen **OR** acetaminophen, chlorpheniramine-HYDROcodone, guaiFENesin-dextromethorphan, ondansetron  (ZOFRAN) IV, sodium phosphate, sorbitol  Antibiotics  :    Anti-infectives (From admission, onward)   Start     Dose/Rate Route Frequency Ordered Stop   09/01/18 2200  hydroxychloroquine (PLAQUENIL) tablet 200 mg     200 mg Oral 2 times daily 09/01/18 1840         Time Spent in minutes  25   Oren Binet M.D on 09/04/2018 at 10:41 AM  To page go to [email or web address] - password [facility]  [facility] -  Office  [phone]   Admit date - 09/01/2018    3    Objective:   Vitals:   09/04/18 0000 09/04/18 0400 09/04/18 0500 09/04/18 0827  BP: (!) 129/56 (!) 144/65 (!) 144/65 106/86  Pulse: 65 (!) 55 85   Resp: _0 Temp:   98.5 F (36.9 C)   TempSrc:   Oral   SpO2: 95% 93%    Weight:      Height:        Wt Readings from Last 3 Encounters:  09/02/18 72.6 kg  09/01/18 72.6 kg  06/07/18 72.6 kg     Intake/Output Summary (Last 24 hours) at 09/04/2018 1041 Last data filed at 09/03/2018 2230 Gross per 24 hour  Intake 833.11 ml  Output -  Net 833.11 ml     Physical Exam General appearance:Awake, alert, not in any distress.  Eyes:no scleral icterus. HEENT: Atraumatic and Normocephalic Neck: supple, no JVD. Resp:Good air entry bilaterally,no rales or rhonchi CVS: S1 S2 regular, no murmurs.  GI: Bowel sounds present, Non tender and not distended with no gaurding, rigidity or rebound. Extremities: B/L Lower Ext shows no edema, both legs are warm to touch Neurology:  Non focal Psychiatric: Normal judgment and insight. Normal mood. Musculoskeletal:No digital cyanosis Skin:No Rash, warm and dry Wounds:N/A   Data Review:    CBC Recent Labs  Lab 09/01/18 1024 09/02/18 0411 09/03/18 0355 09/04/18 0416  WBC 5.7 3.6* 1.9* 5.6  HGB 12.4 9.9* 10.1* 9.5*  HCT 37.6 31.8* 32.2* 30.1*  PLT 120* 90* 95* 100*  MCV 94.5 98.5 97.0 98.0  MCH 31.2 30.7 30.4 30.9  MCHC  33.0 31.1 31.4 31.6  RDW 13.3 13.3 13.4 13.7    Chemistries  Recent Labs  Lab 09/01/18  1024 09/02/18 0411 09/03/18 0355 09/04/18 0416  NA 132* 134* 135 135  K 4.0 3.5 3.6 3.3*  CL 103 112* 113* 112*  CO2 15* 12* 15* 14*  GLUCOSE 97 69* 118* 122*  BUN 60* 48* 38* 33*  CREATININE 2.59* 2.04* 1.73* 1.65*  CALCIUM 8.8* 7.8* 8.1* 7.9*  MG  --   --  1.7 1.5*  AST  --   --  26  --   ALT  --   --  9  --   ALKPHOS  --   --  53  --   BILITOT  --   --  0.2*  --    ------------------------------------------------------------------------------------------------------------------ No results for input(s): CHOL, HDL, LDLCALC, TRIG, CHOLHDL, LDLDIRECT in the last 72 hours.  No results found for: HGBA1C ------------------------------------------------------------------------------------------------------------------ No results for input(s): TSH, T4TOTAL, T3FREE, THYROIDAB in the last 72 hours.  Invalid input(s): FREET3 ------------------------------------------------------------------------------------------------------------------ Recent Labs    09/03/18 0355 09/04/18 0416  FERRITIN 668* 575*    Coagulation profile No results for input(s): INR, PROTIME in the last 168 hours.  Recent Labs    09/03/18 0355 09/04/18 0416  DDIMER 1.62* 2.97*    Cardiac Enzymes Recent Labs  Lab 09/01/18 1024 09/01/18 1632  TROPONINI <0.03 <0.03   ------------------------------------------------------------------------------------------------------------------ No results found for: BNP  Micro Results No results found for this or any previous visit (from the past 240 hour(s)).  Radiology Reports Dg Chest 1 View  Result Date: 09/01/2018 CLINICAL DATA:  78-year-old female with syncope today. Reports positive for COVID-19. Cough and shortness of breath. EXAM: CHEST  1 VIEW COMPARISON:  Chest radiographs 04/12/2018. FINDINGS: Portable AP upright view at 1037 hours. Peripheral patchy and indistinct opacity in the right upper lobe is new since December. Questionable mildly increased  interstitial markings also at the right lung base. Chronic underlying pulmonary interstitial prominence and large lung volumes. Mediastinal contours remain normal. No pneumothorax or pleural effusion. Prior cervical ACDF. No acute osseous abnormality identified. IMPRESSION: Patchy and indistinct opacity in the right lung new since December compatible with COVID-19 pneumonia. Electronically Signed   By: H  Hall M.D.   On: 09/01/2018 11:04   Dg Chest Port 1 View  Result Date: 09/03/2018 CLINICAL DATA:  Cold and positive patient. Cough and shortness of breath. EXAM: PORTABLE CHEST 1 VIEW COMPARISON:  September 01, 2018 FINDINGS: Opacity in the right lateral lung persists, similar to slightly more prominent the interval. No other pulmonary infiltrates noted. The heart, hila, mediastinum, lungs, and pleura are otherwise unremarkable. IMPRESSION: 1. Focal infiltrate in the right lateral mid lung, stable to mildly more prominent the interval. No other changes. Electronically Signed   By: David  Williams III M.D   On: 09/03/2018 06:24    

## 2018-09-05 LAB — BASIC METABOLIC PANEL
Anion gap: 7 (ref 5–15)
BUN: 24 mg/dL — ABNORMAL HIGH (ref 8–23)
CO2: 18 mmol/L — ABNORMAL LOW (ref 22–32)
Calcium: 7.6 mg/dL — ABNORMAL LOW (ref 8.9–10.3)
Chloride: 113 mmol/L — ABNORMAL HIGH (ref 98–111)
Creatinine, Ser: 1.48 mg/dL — ABNORMAL HIGH (ref 0.44–1.00)
GFR calc Af Amer: 39 mL/min — ABNORMAL LOW (ref >60)
GFR calc non Af Amer: 34 mL/min — ABNORMAL LOW (ref >60)
Glucose, Bld: 115 mg/dL — ABNORMAL HIGH (ref 70–99)
Potassium: 3.7 mmol/L (ref 3.5–5.1)
Sodium: 138 mmol/L (ref 135–145)

## 2018-09-05 MED ORDER — METOPROLOL TARTRATE 5 MG/5ML IV SOLN
2.5000 mg | Freq: Once | INTRAVENOUS | Status: AC
  Administered 2018-09-05: 2.5 mg via INTRAVENOUS
  Filled 2018-09-05: qty 5

## 2018-09-05 MED ORDER — SODIUM BICARBONATE 650 MG PO TABS: 650.0000 mg | ORAL_TABLET | Freq: Two times a day (BID) | ORAL | 0 refills | Status: DC

## 2018-09-05 MED ORDER — LEFLUNOMIDE 10 MG PO TABS: 10.0000 mg | ORAL_TABLET | Freq: Every day | ORAL | 1 refills | Status: DC

## 2018-09-05 MED ORDER — POTASSIUM CHLORIDE CRYS ER 20 MEQ PO TBCR
40.0000 meq | EXTENDED_RELEASE_TABLET | Freq: Once | ORAL | Status: AC
  Administered 2018-09-05: 40 meq via ORAL
  Filled 2018-09-05: qty 2

## 2018-09-05 MED ORDER — ATENOLOL 50 MG PO TABS: 25.0000 mg | ORAL_TABLET | Freq: Every day | ORAL | 0 refills | Status: DC

## 2018-09-05 MED ORDER — SODIUM CHLORIDE 0.9 % IV BOLUS
1000.0000 mL | Freq: Once | INTRAVENOUS | Status: AC
  Administered 2018-09-05: 1000 mL via INTRAVENOUS

## 2018-09-05 MED ORDER — ATENOLOL 50 MG PO TABS
25.0000 mg | ORAL_TABLET | Freq: Every day | ORAL | Status: DC
  Administered 2018-09-05: 25 mg via ORAL
  Filled 2018-09-05: qty 0.5

## 2018-09-05 MED ORDER — ATENOLOL 50 MG PO TABS
50.0000 mg | ORAL_TABLET | Freq: Every day | ORAL | Status: DC
  Filled 2018-09-05: qty 1

## 2018-09-05 MED ORDER — BENZONATATE 100 MG PO CAPS: 100.0000 mg | ORAL_CAPSULE | Freq: Four times a day (QID) | ORAL | 0 refills | Status: DC | PRN

## 2018-09-05 MED ORDER — MIDODRINE HCL 2.5 MG PO TABS: 5.0000 mg | ORAL_TABLET | Freq: Two times a day (BID) | ORAL | 0 refills | Status: DC

## 2018-09-05 NOTE — Discharge Summary (Signed)
PATIENT DETAILS Name: Sydney Gillespie Age: 78 y.o. Sex: female Date of Birth: 1941/02/11 MRN: [id number]. Admitting Physician: Terrilee Croak, MD [id number], Cherlyn Labella, MD  Admit Date: 09/01/2018 Discharge date: 09/05/2018  Recommendations for Outpatient Follow-up:  1. Follow up with PCP in 1-2 weeks 2. Please obtain BMP/CBC in one week 3. Arava/Plaquenil on hold-resume when able 4. Atenolol dose reduced due to orthostatics-have held Aldactone.  Admitted From:  Home  Disposition: Home with home health services   Home Health: Yes  Equipment/Devices: None  Discharge Condition: Stable  CODE STATUS: FULL CODE  Diet recommendation:  Heart Healthy  Brief Summary: See H&P, Labs, Consult and Test reports for all details in brief, Patient is a 78 y.o. female with PMHx of multiple myeloma s/p stem cell transplant currently in remission, rheumatoid arthritis, CKD stage IV, hypothyroidism-who presented with 1 week history of cough, presyncope, worsening shortness of breath found to have mild acute hypoxic respiratory failure in the setting of COVID-19 viral pneumonia.  Brief Hospital Course: Acute hypoxemic respiratory failure in the setting of COVID-19 of viral pneumonia: Improved- titrated off oxygen and in room air.  Managed with supportive care  Sepsis secondary to above: Sepsis pathophysiology has improved-was initially hypotensive-better with IVF and stress dose steroids.  AKI on CKD stage IV: AKI likely hemodynamically mediated-creatinine has significantly improved with supportive care.  Follow periodically as outpatient.  Mild metabolic acidosis: Likely secondary to above-continue bicarb supplementation.  Presyncope: Likely secondary to orthostatic hypotension-she never lost consciousness.    Treated with midodrine-although still orthostatic-hardly any symptoms.  Per nursing staff-she was able to ambulate with them today without major issues.  Has been restarted on  25 mg of atenolol which will be continued-we will continue low-dose midodrine.  She will follow with her primary care practitioner for further medical management.  Thrombocytopenia/leukopenia: Leukopenia has resolved-thrombocytopenia slowly improving.  This is likely related to COVID-19 infection and should improve with time and supportive care.    She does have a history of multiple myeloma-on discharge she will need to follow-up with a hematologist to ensure that these labs have normalized.  Rheumatoid arthritis: Due to QT prolongation-Plaquenil was discontinued.  Arava remains on hold due to acute illness.  I have asked her to follow-up with her primary rheumatologist/primary care practitioner before resuming any of these medications.  Hypertension: Given orthostatic hypotension-: Antihypertensives were held-but patient started having palpitations and runs of sinus tachycardia-and hence atenolol was restarted at a lower dose.  Blood pressure remains stable.    History of palpitations: Per patient-she gets palpitations-and has been on atenolol for this reason.  She did have some episodes of sinus tachycardia yesterday-she was restarted on low-dose atenolol. She currently is in sinus rhythm-with stable heart rate.  History of multiple myeloma s/p stem cell transplant: Resume follow-up with primary hematologist on discharge.  Procedures/Studies: None  Discharge Diagnoses:  Active Problems:   Syncope   Pneumonia due to COVID-19 virus   Discharge Instructions:  Activity:  As tolerated with Full fall precautions use walker/cane & assistance as needed  Discharge Instructions    Call MD for:  difficulty breathing, headache or visual disturbances   Complete by:  As directed    Call MD for:  temperature >100.4   Complete by:  As directed    Diet - low sodium heart healthy   Complete by:  As directed    Discharge instructions   Complete by:  As directed    Follow with Primary MD  Hande,  Vishwanath, M in 1 week  Your Blood pressure drops when standing up-we have started a medication called Midodrine to help with this drop in Blood pressure. Continue to Hold Aldactone for now, we have reduced your dose of Aldactone so that your blood pressure does not drop a lot.   Please get a complete blood count and chemistry panel checked by your Primary MD at your next visit, and again as instructed by your Primary MD.  Hold both plaquenil and Arava-until you have completely recovered from the COVID 19 infection.You have some EKG changes related to Plaquenil-hence it was discontinued during this hospital stay.  Get Medicines reviewed and adjusted: Please take all your medications with you for your next visit with your Primary MD  Laboratory/radiological data: Please request your Primary MD to go over all hospital tests and procedure/radiological results at the follow up, please ask your Primary MD to get all Hospital records sent to his/her office.  In some cases, they will be blood work, cultures and biopsy results pending at the time of your discharge. Please request that your primary care M.D. follows up on these results.  Also Note the following: If you experience worsening of your admission symptoms, develop shortness of breath, life threatening emergency, suicidal or homicidal thoughts you must seek medical attention immediately by calling 911 or calling your MD immediately  if symptoms less severe.  You must read complete instructions/literature along with all the possible adverse reactions/side effects for all the Medicines you take and that have been prescribed to you. Take any new Medicines after you have completely understood and accpet all the possible adverse reactions/side effects.   Do not drive when taking Pain medications or sleeping medications (Benzodaizepines)  Do not take more than prescribed Pain, Sleep and Anxiety Medications. It is not advisable to combine  anxiety,sleep and pain medications without talking with your primary care practitioner  Special Instructions: If you have smoked or chewed Tobacco  in the last 2 yrs please stop smoking, stop any regular Alcohol  and or any Recreational drug use.  Wear Seat belts while driving.  Please note: You were cared for by a hospitalist during your hospital stay. Once you are discharged, your primary care physician will handle any further medical issues. Please note that NO REFILLS for any discharge medications will be authorized once you are discharged, as it is imperative that you return to your primary care physician (or establish a relationship with a primary care physician if you do not have one) for your post hospital discharge needs so that they can reassess your need for medications and monitor your lab values.  ?   Person Under Monitoring Name: Sydney Gillespie  Location: [place] [address]   Infection Prevention Recommendations for Individuals Confirmed to have, or Being Evaluated for, 2019 Novel Coronavirus (COVID-19) Infection Who Receive Care at Home  Individuals who are confirmed to have, or are being evaluated for, COVID-19 should follow the prevention steps below until a healthcare provider or local or state health department says they can return to normal activities.  Stay home except to get medical care You should restrict activities outside your home, except for getting medical care. Do not go to work, school, or public areas, and do not use public transportation or taxis.  Call ahead before visiting your doctor Before your medical appointment, call the healthcare provider and tell them that you have, or are being evaluated for, COVID-19 infection. This will help the  healthcare providers office take steps to keep other people from getting infected. Ask your healthcare provider to call the local or state health department.  Monitor your symptoms Seek  prompt medical attention if your illness is worsening (e.g., difficulty breathing). Before going to your medical appointment, call the healthcare provider and tell them that you have, or are being evaluated for, COVID-19 infection. Ask your healthcare provider to call the local or state health department.  Wear a facemask You should wear a facemask that covers your nose and mouth when you are in the same room with other people and when you visit a healthcare provider. People who live with or visit you should also wear a facemask while they are in the same room with you.  Separate yourself from other people in your home As much as possible, you should stay in a different room from other people in your home. Also, you should use a separate bathroom, if available.  Avoid sharing household items You should not share dishes, drinking glasses, cups, eating utensils, towels, bedding, or other items with other people in your home. After using these items, you should wash them thoroughly with soap and water.  Cover your coughs and sneezes Cover your mouth and nose with a tissue when you cough or sneeze, or you can cough or sneeze into your sleeve. Throw used tissues in a lined trash can, and immediately wash your hands with soap and water for at least 20 seconds or use an alcohol-based hand rub.  Wash your [facility] your hands often and thoroughly with soap and water for at least 20 seconds. You can use an alcohol-based hand sanitizer if soap and water are not available and if your hands are not visibly dirty. Avoid touching your eyes, nose, and mouth with unwashed hands.   Prevention Steps for Caregivers and Household Members of Individuals Confirmed to have, or Being Evaluated for, COVID-19 Infection Being Cared for in the Home  If you live with, or provide care at home for, a person confirmed to have, or being evaluated for, COVID-19 infection please follow these guidelines to prevent  infection:  Follow healthcare providers instructions Make sure that you understand and can help the patient follow any healthcare provider instructions for all care.  Provide for the patients basic needs You should help the patient with basic needs in the home and provide support for getting groceries, prescriptions, and other personal needs.  Monitor the patients symptoms If they are getting sicker, call his or her medical provider and tell them that the patient has, or is being evaluated for, COVID-19 infection. This will help the healthcare providers office take steps to keep other people from getting infected. Ask the healthcare provider to call the local or state health department.  Limit the number of people who have contact with the patient If possible, have only one caregiver for the patient. Other household members should stay in another home or place of residence. If this is not possible, they should stay in another room, or be separated from the patient as much as possible. Use a separate bathroom, if available. Restrict visitors who do not have an essential need to be in the home.  Keep older adults, very young children, and other sick people away from the patient Keep older adults, very young children, and those who have compromised immune systems or chronic health conditions away from the patient. This includes people with chronic heart, lung, or kidney conditions, diabetes, and cancer.  Ensure good ventilation Make sure that shared spaces in the home have good air flow, such as from an air conditioner or an opened window, weather permitting.  Wash your hands often Wash your hands often and thoroughly with soap and water for at least 20 seconds. You can use an alcohol based hand sanitizer if soap and water are not available and if your hands are not visibly dirty. Avoid touching your eyes, nose, and mouth with unwashed hands. Use disposable paper towels to dry your  hands. If not available, use dedicated cloth towels and replace them when they become wet.  Wear a facemask and gloves Wear a disposable facemask at all times in the room and gloves when you touch or have contact with the patients blood, body fluids, and/or secretions or excretions, such as sweat, saliva, sputum, nasal mucus, vomit, urine, or feces.  Ensure the mask fits over your nose and mouth tightly, and do not touch it during use. Throw out disposable facemasks and gloves after using them. Do not reuse. Wash your hands immediately after removing your facemask and gloves. If your personal clothing becomes contaminated, carefully remove clothing and launder. Wash your hands after handling contaminated clothing. Place all used disposable facemasks, gloves, and other waste in a lined container before disposing them with other household waste. Remove gloves and wash your hands immediately after handling these items.  Do not share dishes, glasses, or other household items with the patient Avoid sharing household items. You should not share dishes, drinking glasses, cups, eating utensils, towels, bedding, or other items with a patient who is confirmed to have, or being evaluated for, COVID-19 infection. After the person uses these items, you should wash them thoroughly with soap and water.  Wash laundry thoroughly Immediately remove and wash clothes or bedding that have blood, body fluids, and/or secretions or excretions, such as sweat, saliva, sputum, nasal mucus, vomit, urine, or feces, on them. Wear gloves when handling laundry from the patient. Read and follow directions on labels of laundry or clothing items and detergent. In general, wash and dry with the warmest temperatures recommended on the label.  Clean all areas the individual has used often Clean all touchable surfaces, such as counters, tabletops, doorknobs, bathroom fixtures, toilets, phones, keyboards, tablets, and bedside tables,  every day. Also, clean any surfaces that may have blood, body fluids, and/or secretions or excretions on them. Wear gloves when cleaning surfaces the patient has come in contact with. Use a diluted bleach solution (e.g., dilute bleach with 1 part bleach and 10 parts water) or a household disinfectant with a label that says EPA-registered for coronaviruses. To make a bleach solution at home, add 1 tablespoon of bleach to 1 quart (4 cups) of water. For a larger supply, add  cup of bleach to 1 gallon (16 cups) of water. Read labels of cleaning products and follow recommendations provided on product labels. Labels contain instructions for safe and effective use of the cleaning product including precautions you should take when applying the product, such as wearing gloves or eye protection and making sure you have good ventilation during use of the product. Remove gloves and wash hands immediately after cleaning.  Monitor yourself for signs and symptoms of illness Caregivers and household members are considered close contacts, should monitor their health, and will be asked to limit movement outside of the home to the extent possible. Follow the monitoring steps for close contacts listed on the symptom monitoring form.   ? If you  have additional questions, contact your local health department or call the epidemiologist on call at [phone] (available 24/7). ? This guidance is subject to change. For the most up-to-date guidance from [facility], please refer to their website: YouBlogs.pl   Increase activity slowly   Complete by:  As directed    MyChart COVID-19 home monitoring program   Complete by:  Sep 05, 2018    Is the patient willing to use a smartphone for remote monitoring via the MyChart app?:  No     Allergies as of 09/05/2018      Reactions   Isoniazid Other (See Comments)   Blisters    Keflex [cephalexin] Other (See Comments)    Blisters      Medication List    STOP taking these medications   hydroxychloroquine 200 MG tablet Commonly known as:  PLAQUENIL   spironolactone 25 MG tablet Commonly known as:  ALDACTONE   tolterodine 4 MG 24 hr capsule Commonly known as:  Detrol LA   triamterene-hydrochlorothiazide 37.5-25 MG tablet Commonly known as:  MAXZIDE-25     TAKE these medications   acetaminophen 325 MG tablet Commonly known as:  TYLENOL Take 650 mg by mouth every 6 (six) hours as needed for fever or headache.   allopurinol 300 MG tablet Commonly known as:  ZYLOPRIM Take 300 mg by mouth daily.   ALPRAZolam 0.25 MG tablet Commonly known as:  XANAX Take 0.25 mg by mouth at bedtime.   atenolol 50 MG tablet Commonly known as:  TENORMIN Take 0.5 tablets (25 mg total) by mouth daily. What changed:  how much to take   benzonatate 100 MG capsule Commonly known as:  Tessalon Perles Take 1 capsule (100 mg total) by mouth every 6 (six) hours as needed for cough.   cetirizine 10 MG tablet Commonly known as:  ZYRTEC Take 10 mg by mouth 2 (two) times daily.   fluticasone 50 MCG/ACT nasal spray Commonly known as:  FLONASE Place 2 sprays into the nose daily.   lansoprazole 30 MG capsule Commonly known as:  PREVACID Take 30 mg by mouth daily at 12 noon.   leflunomide 10 MG tablet Commonly known as:  ARAVA Take 1 tablet (10 mg total) by mouth daily. Start taking on:  Sep 13, 2018 What changed:  These instructions start on Sep 13, 2018. If you are unsure what to do until then, ask your doctor or other care provider.   levothyroxine 75 MCG tablet Commonly known as:  SYNTHROID   midodrine 2.5 MG tablet Commonly known as:  PROAMATINE Take 2 tablets (5 mg total) by mouth 2 (two) times daily with a meal.   oxybutynin 10 MG 24 hr tablet Commonly known as:  DITROPAN-XL Take 10 mg by mouth daily.   sodium bicarbonate 650 MG tablet Take 1 tablet (650 mg total) by mouth 2 (two) times daily.     Vitamin D (Ergocalciferol) 1.25 MG (50000 UT) Caps capsule Commonly known as:  DRISDOL Take 1 capsule by mouth once a week.      Follow-up Information    [name], MD. Schedule an appointment as soon as possible for a visit in 1 week(s).   Specialty:  Internal Medicine Contact information: [address]   [address] [phone]          Allergies  Allergen Reactions   Isoniazid Other (See Comments)    Blisters    Keflex [Cephalexin] Other (See Comments)    Blisters     Consultations:  None  Other Procedures/Studies: Dg Chest 1 View  Result Date: 09/01/2018 CLINICAL DATA:  78 year old female with syncope today. Reports positive for COVID-19. Cough and shortness of breath. EXAM: CHEST  1 VIEW COMPARISON:  Chest radiographs 04/12/2018. FINDINGS: Portable AP upright view at 1037 hours. Peripheral patchy and indistinct opacity in the right upper lobe is new since December. Questionable mildly increased interstitial markings also at the right lung base. Chronic underlying pulmonary interstitial prominence and large lung volumes. Mediastinal contours remain normal. No pneumothorax or pleural effusion. Prior cervical ACDF. No acute osseous abnormality identified. IMPRESSION: Patchy and indistinct opacity in the right lung new since December compatible with COVID-19 pneumonia. Electronically Signed   By: Genevie Ann M.D.   On: 09/01/2018 11:04   Dg Chest Port 1 View  Result Date: 09/03/2018 CLINICAL DATA:  Cold and positive patient. Cough and shortness of breath. EXAM: PORTABLE CHEST 1 VIEW COMPARISON:  September 01, 2018 FINDINGS: Opacity in the right lateral lung persists, similar to slightly more prominent the interval. No other pulmonary infiltrates noted. The heart, hila, mediastinum, lungs, and pleura are otherwise unremarkable. IMPRESSION: 1. Focal infiltrate in the right lateral mid lung, stable to mildly more prominent the interval. No other changes.  Electronically Signed   By: Dorise Bullion III M.D   On: 09/03/2018 06:24     TODAY-DAY OF DISCHARGE:  Subjective:   Sydney Gillespie today has no headache,no chest abdominal pain,no new weakness tingling or numbness, feels much better wants to go home today.   Objective:   Blood pressure 136/80, pulse (!) 131, temperature 98.7 F (37.1 C), temperature source Oral, resp. rate (!) 25, height _0  (1.702 m), weight 72.6 kg, SpO2 96 %.  Intake/Output Summary (Last 24 hours) at 09/05/2018 1348 Last data filed at 09/05/2018 0550 Gross per 24 hour  Intake 1540.01 ml  Output 450 ml  Net 1090.01 ml   Filed Weights   09/02/18 1609  Weight: 72.6 kg    Exam: Awake Alert, Oriented *3, No new F.N deficits, Normal affect [place].AT,PERRAL Supple Neck,No JVD, No cervical lymphadenopathy appriciated.  Symmetrical Chest wall movement, Good air movement bilaterally, CTAB RRR,No Gallops,Rubs or new Murmurs, No Parasternal Heave +ve B.Sounds, Abd Soft, Non tender, No organomegaly appriciated, No rebound -guarding or rigidity. No Cyanosis, Clubbing or edema, No new Rash or bruise   PERTINENT RADIOLOGIC STUDIES: Dg Chest 1 View  Result Date: 09/01/2018 CLINICAL DATA:  78 year old female with syncope today. Reports positive for COVID-19. Cough and shortness of breath. EXAM: CHEST  1 VIEW COMPARISON:  Chest radiographs 04/12/2018. FINDINGS: Portable AP upright view at 1037 hours. Peripheral patchy and indistinct opacity in the right upper lobe is new since December. Questionable mildly increased interstitial markings also at the right lung base. Chronic underlying pulmonary interstitial prominence and large lung volumes. Mediastinal contours remain normal. No pneumothorax or pleural effusion. Prior cervical ACDF. No acute osseous abnormality identified. IMPRESSION: Patchy and indistinct opacity in the right lung new since December compatible with COVID-19 pneumonia. Electronically Signed   By: Genevie Ann  M.D.   On: 09/01/2018 11:04   Dg Chest Port 1 View  Result Date: 09/03/2018 CLINICAL DATA:  Cold and positive patient. Cough and shortness of breath. EXAM: PORTABLE CHEST 1 VIEW COMPARISON:  September 01, 2018 FINDINGS: Opacity in the right lateral lung persists, similar to slightly more prominent the interval. No other pulmonary infiltrates noted. The heart, hila, mediastinum, lungs, and pleura are otherwise unremarkable. IMPRESSION: 1. Focal infiltrate in the  right lateral mid lung, stable to mildly more prominent the interval. No other changes. Electronically Signed   By: Dorise Bullion III M.D   On: 09/03/2018 06:24     PERTINENT LAB RESULTS: CBC: Recent Labs    09/03/18 0355 09/04/18 0416  WBC 1.9* 5.6  HGB 10.1* 9.5*  HCT 32.2* 30.1*  PLT 95* 100*   CMET CMP     Component Value Date/Time   NA 138 09/05/2018 0512   NA 142 08/16/2013 1028   K 3.7 09/05/2018 0512   K 4.5 08/16/2013 1028   CL 113 (H) 09/05/2018 0512   CL 106 08/16/2013 1028   CO2 18 (L) 09/05/2018 0512   CO2 27 08/16/2013 1028   GLUCOSE 115 (H) 09/05/2018 0512   GLUCOSE 99 08/16/2013 1028   BUN 24 (H) 09/05/2018 0512   BUN 21 (H) 08/16/2013 1028   CREATININE 1.48 (H) 09/05/2018 0512   CREATININE 1.42 (H) 09/07/2014 0954   CALCIUM 7.6 (L) 09/05/2018 0512   CALCIUM 9.3 09/07/2014 0954   PROT 5.9 (L) 09/03/2018 0355   PROT 7.4 08/16/2013 1028   ALBUMIN 3.2 (L) 09/03/2018 0355   ALBUMIN 3.9 08/16/2013 1028   AST 26 09/03/2018 0355   AST 27 08/16/2013 1028   ALT 9 09/03/2018 0355   ALT 15 08/16/2013 1028   ALKPHOS 53 09/03/2018 0355   ALKPHOS 61 08/16/2013 1028   BILITOT 0.2 (L) 09/03/2018 0355   BILITOT 0.3 08/16/2013 1028   GFRNONAA 34 (L) 09/05/2018 0512   GFRNONAA 36 (L) 09/07/2014 0954   GFRAA 39 (L) 09/05/2018 0512   GFRAA 42 (L) 09/07/2014 0954    GFR Estimated Creatinine Clearance: 30.5 mL/min (A) (by C-G formula based on SCr of 1.48 mg/dL (H)). No results for input(s): LIPASE, AMYLASE in  the last 72 hours. No results for input(s): CKTOTAL, CKMB, CKMBINDEX, TROPONINI in the last 72 hours. Invalid input(s): POCBNP Recent Labs    09/03/18 0355 09/04/18 0416  DDIMER 1.62* 2.97*   No results for input(s): HGBA1C in the last 72 hours. No results for input(s): CHOL, HDL, LDLCALC, TRIG, CHOLHDL, LDLDIRECT in the last 72 hours. No results for input(s): TSH, T4TOTAL, T3FREE, THYROIDAB in the last 72 hours.  Invalid input(s): FREET3 Recent Labs    09/03/18 0355 09/04/18 0416  FERRITIN 668* 575*   Coags: No results for input(s): INR in the last 72 hours.  Invalid input(s): PT Microbiology: No results found for this or any previous visit (from the past 240 hour(s)).  FURTHER DISCHARGE INSTRUCTIONS:  Get Medicines reviewed and adjusted: Please take all your medications with you for your next visit with your Primary MD  Laboratory/radiological data: Please request your Primary MD to go over all hospital tests and procedure/radiological results at the follow up, please ask your Primary MD to get all Hospital records sent to his/her office.  In some cases, they will be blood work, cultures and biopsy results pending at the time of your discharge. Please request that your primary care M.D. goes through all the records of your hospital data and follows up on these results.  Also Note the following: If you experience worsening of your admission symptoms, develop shortness of breath, life threatening emergency, suicidal or homicidal thoughts you must seek medical attention immediately by calling 911 or calling your MD immediately  if symptoms less severe.  You must read complete instructions/literature along with all the possible adverse reactions/side effects for all the Medicines you take and that have been prescribed  to you. Take any new Medicines after you have completely understood and accpet all the possible adverse reactions/side effects.   Do not drive when taking Pain  medications or sleeping medications (Benzodaizepines)  Do not take more than prescribed Pain, Sleep and Anxiety Medications. It is not advisable to combine anxiety,sleep and pain medications without talking with your primary care practitioner  Special Instructions: If you have smoked or chewed Tobacco  in the last 2 yrs please stop smoking, stop any regular Alcohol  and or any Recreational drug use.  Wear Seat belts while driving.  Please note: You were cared for by a hospitalist during your hospital stay. Once you are discharged, your primary care physician will handle any further medical issues. Please note that NO REFILLS for any discharge medications will be authorized once you are discharged, as it is imperative that you return to your primary care physician (or establish a relationship with a primary care physician if you do not have one) for your post hospital discharge needs so that they can reassess your need for medications and monitor your lab values.  Total Time spent coordinating discharge including counseling, education and face to face time equals 35 minutes.  SignedOren Binet 09/05/2018 1:48 PM

## 2018-09-05 NOTE — Progress Notes (Signed)
Seen and examined earlier this morning. Not on home O2 Lungs clear. Some tachy overnight-per patient she has a history of palpitations and is on Atenolol-and usually recurs when she misses a dose (Held on admit due to hypotension)  She was ambulated by the RN staff this afternoon-and did well, hence will d/c home today with home health services.   See d/c summary for details.

## 2018-09-05 NOTE — Discharge Instructions (Signed)
Person Under Monitoring Name: Sydney Gillespie  Location: [place] [address]   CORONAVIRUS DISEASE 2019 (COVID-19) Guidance for Persons Under Investigation You are being tested for the virus that causes coronavirus disease 2019 (COVID-19). Public health actions are necessary to ensure protection of your health and the health of others, and to prevent further spread of infection. COVID-19 is caused by a virus that can cause symptoms, such as fever, cough, and shortness of breath. The primary transmission from person to person is by coughing or sneezing. On June 09, 2018, the [place] announced a [employer] Emergency of International Concern and on June 10, 2018 the U.S. Department of Health and Human Services declared a public health emergency. If the virus that causesCOVID-19 spreads in the community, it could have severe public health consequences.  As a person under investigation for COVID-19, the [place] advises you to adhere to the following guidance until your test results are reported to you. If your test result is positive, you will receive additional information from your provider and your local health department at that time.   Remain at home until you are cleared by your health provider or public health authorities.   Keep a log of visitors to your home using the form provided. Any visitors to your home must be aware of your isolation status.  If you plan to move to a new address or leave the county, notify the local health department in your county.  Call a doctor or seek care if you have an urgent medical need. Before seeking medical care, call ahead and get instructions from the provider before arriving at the medical office, clinic or hospital. Notify them that you are being tested for the virus that causes COVID-19 so arrangements can be made, as necessary,  to prevent transmission to others in the healthcare setting. Next, notify the local health department in your county.  If a medical emergency arises and you need to call 911, inform the first responders that you are being tested for the virus that causes COVID-19. Next, notify the local health department in your county.  Adhere to all guidance set forth by the [place] for [facility] of patients that is based on guidance from the Center for Disease Control and Prevention with suspected or confirmed COVID-19. It is provided with this guidance for Persons Under Investigation.  Your health and the health of our community are our top priorities. Public Health officials remain available to provide assistance and counseling to you about COVID-19 and compliance with this guidance.  Provider: ____________________________________________________________ Date: ______/_____/_________  By signing below, you acknowledge that you have read and agree to comply with this Guidance for Persons Under Investigation. ______________________________________________________________ Date: ______/_____/_________  WHO DO I CALL? You can find a list of local health departments here: [email or web address] Health Department: ____________________________________________________________________ Contact Name: ________________________________________________________________________ Telephone: ___________________________________________________________________________  Marice Potter, [place], Communicable Disease Branch COVID-19 Guidance for Persons Under Investigation July 16, 2018     Person Under Monitoring Name: Sydney Gillespie  Location: [place] [address]   CORONAVIRUS DISEASE 2019 (COVID-19) Guidance for Persons Under Investigation You are being tested for the virus that causes  coronavirus disease 2019 (COVID-19). Public health actions are necessary to ensure protection of your health and the health of others, and to prevent further spread of infection. COVID-19 is  caused by a virus that can cause symptoms, such as fever, cough, and shortness of breath. The primary transmission from person to person is by coughing or sneezing. On June 09, 2018, the [place] announced a [employer] Emergency of International Concern and on June 10, 2018 the U.S. Department of Health and Human Services declared a public health emergency. If the virus that causesCOVID-19 spreads in the community, it could have severe public health consequences.  As a person under investigation for COVID-19, the [place] advises you to adhere to the following guidance until your test results are reported to you. If your test result is positive, you will receive additional information from your provider and your local health department at that time.   Remain at home until you are cleared by your health provider or public health authorities.   Keep a log of visitors to your home using the form provided. Any visitors to your home must be aware of your isolation status.  If you plan to move to a new address or leave the county, notify the local health department in your county.  Call a doctor or seek care if you have an urgent medical need. Before seeking medical care, call ahead and get instructions from the provider before arriving at the medical office, clinic or hospital. Notify them that you are being tested for the virus that causes COVID-19 so arrangements can be made, as necessary, to prevent transmission to others in the healthcare setting. Next, notify the local health department in your county.  If a medical emergency arises and you need to call 911, inform the first responders that you are being tested for  the virus that causes COVID-19. Next, notify the local health department in your county.  Adhere to all guidance set forth by the [place] for [facility] of patients that is based on guidance from the Center for Disease Control and Prevention with suspected or confirmed COVID-19. It is provided with this guidance for Persons Under Investigation.  Your health and the health of our community are our top priorities. Public Health officials remain available to provide assistance and counseling to you about COVID-19 and compliance with this guidance.  Provider: ____________________________________________________________ Date: ______/_____/_________  By signing below, you acknowledge that you have read and agree to comply with this Guidance for Persons Under Investigation. ______________________________________________________________ Date: ______/_____/_________  WHO DO I CALL? You can find a list of local health departments here: [email or web address] Health Department: ____________________________________________________________________ Contact Name: ________________________________________________________________________ Telephone: ___________________________________________________________________________  Marice Potter, [place], Communicable Disease Branch COVID-19 Guidance for Persons Under Investigation July 16, 2018

## 2018-09-05 NOTE — Progress Notes (Signed)
0030 - 2 hours post  Plaquenil administration, QTc >500; EKG performed - Sinus Tachy w/PVCs ranging 115-180; MD notified - Metoprolol 2.5mg  and 0.9% NS Bolus administered.

## 2018-09-05 NOTE — Significant Event (Signed)
Called by RN that Pt having runs of sinus tachycardia after dose of hydroxycholorquine. This was d/c and EKg is pending. Other VSS. Pt was given small fluid bolus and 2.5mg  of IV metoprolol and home atneolol was restarted.

## 2018-09-06 ENCOUNTER — Encounter (INDEPENDENT_AMBULATORY_CARE_PROVIDER_SITE_OTHER): Payer: Self-pay

## 2018-11-17 ENCOUNTER — Other Ambulatory Visit: Payer: Self-pay

## 2018-11-17 ENCOUNTER — Other Ambulatory Visit: Payer: Self-pay | Admitting: *Deleted

## 2018-11-17 ENCOUNTER — Inpatient Hospital Stay (HOSPITAL_BASED_OUTPATIENT_CLINIC_OR_DEPARTMENT_OTHER): Payer: Medicare Other | Admitting: Internal Medicine

## 2018-11-17 ENCOUNTER — Inpatient Hospital Stay: Payer: Medicare Other | Attending: Internal Medicine

## 2018-11-17 DIAGNOSIS — C9001 Multiple myeloma in remission: Secondary | ICD-10-CM | POA: Insufficient documentation

## 2018-11-17 DIAGNOSIS — R202 Paresthesia of skin: Secondary | ICD-10-CM

## 2018-11-17 DIAGNOSIS — R200 Anesthesia of skin: Secondary | ICD-10-CM | POA: Diagnosis not present

## 2018-11-17 DIAGNOSIS — N183 Chronic kidney disease, stage 3 (moderate): Secondary | ICD-10-CM | POA: Diagnosis not present

## 2018-11-17 DIAGNOSIS — E039 Hypothyroidism, unspecified: Secondary | ICD-10-CM | POA: Insufficient documentation

## 2018-11-17 DIAGNOSIS — R5383 Other fatigue: Secondary | ICD-10-CM

## 2018-11-17 DIAGNOSIS — Z79899 Other long term (current) drug therapy: Secondary | ICD-10-CM | POA: Diagnosis not present

## 2018-11-17 DIAGNOSIS — C9000 Multiple myeloma not having achieved remission: Secondary | ICD-10-CM

## 2018-11-17 DIAGNOSIS — D631 Anemia in chronic kidney disease: Secondary | ICD-10-CM

## 2018-11-17 LAB — CBC WITH DIFFERENTIAL/PLATELET
Abs Immature Granulocytes: 0.01 10*3/uL (ref 0.00–0.07)
Basophils Absolute: 0.1 10*3/uL (ref 0.0–0.1)
Basophils Relative: 1 %
Eosinophils Absolute: 0.2 10*3/uL (ref 0.0–0.5)
Eosinophils Relative: 3 %
HCT: 32.8 % — ABNORMAL LOW (ref 36.0–46.0)
Hemoglobin: 10.2 g/dL — ABNORMAL LOW (ref 12.0–15.0)
Immature Granulocytes: 0 %
Lymphocytes Relative: 20 %
Lymphs Abs: 1.3 10*3/uL (ref 0.7–4.0)
MCH: 30.7 pg (ref 26.0–34.0)
MCHC: 31.1 g/dL (ref 30.0–36.0)
MCV: 98.8 fL (ref 80.0–100.0)
Monocytes Absolute: 0.5 10*3/uL (ref 0.1–1.0)
Monocytes Relative: 8 %
Neutro Abs: 4.4 10*3/uL (ref 1.7–7.7)
Neutrophils Relative %: 68 %
Platelets: 163 10*3/uL (ref 150–400)
RBC: 3.32 MIL/uL — ABNORMAL LOW (ref 3.87–5.11)
RDW: 14 % (ref 11.5–15.5)
WBC: 6.5 10*3/uL (ref 4.0–10.5)
nRBC: 0 % (ref 0.0–0.2)

## 2018-11-17 LAB — COMPREHENSIVE METABOLIC PANEL
ALT: 10 U/L (ref 0–44)
AST: 25 U/L (ref 15–41)
Albumin: 4.1 g/dL (ref 3.5–5.0)
Alkaline Phosphatase: 65 U/L (ref 38–126)
Anion gap: 10 (ref 5–15)
BUN: 30 mg/dL — ABNORMAL HIGH (ref 8–23)
CO2: 18 mmol/L — ABNORMAL LOW (ref 22–32)
Calcium: 7.6 mg/dL — ABNORMAL LOW (ref 8.9–10.3)
Chloride: 110 mmol/L (ref 98–111)
Creatinine, Ser: 1.84 mg/dL — ABNORMAL HIGH (ref 0.44–1.00)
GFR calc Af Amer: 30 mL/min — ABNORMAL LOW (ref >60)
GFR calc non Af Amer: 26 mL/min — ABNORMAL LOW (ref >60)
Glucose, Bld: 102 mg/dL — ABNORMAL HIGH (ref 70–99)
Potassium: 4.8 mmol/L (ref 3.5–5.1)
Sodium: 138 mmol/L (ref 135–145)
Total Bilirubin: 0.5 mg/dL (ref 0.3–1.2)
Total Protein: 7.2 g/dL (ref 6.5–8.1)

## 2018-11-17 LAB — IRON AND TIBC
Iron: 82 ug/dL (ref 28–170)
Saturation Ratios: 34 % — ABNORMAL HIGH (ref 10.4–31.8)
TIBC: 244 ug/dL — ABNORMAL LOW (ref 250–450)
UIBC: 162 ug/dL

## 2018-11-17 LAB — FERRITIN: Ferritin: 268 ng/mL (ref 11–307)

## 2018-11-17 NOTE — Progress Notes (Signed)
Patient is having left hip pain (8/10 on pain scale with movement) with leg numbness.  She just left her PCP who sent her for x-ray of hip today and prescribed Prdnisond.  Also is having the feeling of being cold all the time and PCP suggest she ask about possibility of iron infusion.  Patient was hospitalized in 08/2018 with COVID.

## 2018-11-17 NOTE — Progress Notes (Signed)
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 benign   Multiple myeloma not having achieved remission ([place])  12/18/2015 Initial Diagnosis   Multiple myeloma in remission [facility])          INTERVAL HISTORY: This is a pleasant 78 year-old female patient with above history of multiple Myeloma [status post autologous stem cell transplant May 2013 patient denies any] currently on surveillance/no maintenance therapy- Is here for follow-up.  Patient was admitted to hospital in April 2020 COVID-19 infection/pneumonia.  Patient did not need intubation/or ICU stay.  She improved on conservative measures.  Patient currently denies any fevers or chills.  Appetite is fair.  Intermittent tingling and numbness in the extremities.  Mild to moderate fatigue.   Review of Systems  Constitutional: Positive for malaise/fatigue. Negative for diaphoresis, fever and weight loss.       Cold intolerance.  HENT: Negative for nosebleeds and sore throat.   Eyes: Negative for double vision.  Respiratory: Negative for cough, hemoptysis, sputum production, shortness of breath and wheezing.   Cardiovascular: Negative for chest pain, palpitations, orthopnea and leg swelling.  Gastrointestinal: Negative for abdominal pain, blood in stool, constipation, diarrhea, heartburn, melena, nausea and vomiting.  Genitourinary: Negative for dysuria, frequency and urgency.  Musculoskeletal: Negative for back pain and joint pain.  Skin: Negative.  Negative for itching and rash.  Neurological:  Positive for tingling. Negative for dizziness, focal weakness, weakness and headaches.  Endo/Heme/Allergies: Does not bruise/bleed easily.  Psychiatric/Behavioral: Negative for depression. The patient is not nervous/anxious and does not have insomnia.      I have reviewed the past medical history, past surgical history, social history and family history with the patient and they are unchanged from previous note unless stated above.  ALLERGIES:  is allergic to isoniazid and keflex [cephalexin].  MEDICATIONS:  Current Outpatient Medications  Medication Sig Dispense Refill  . acetaminophen (TYLENOL) 325 MG tablet Take 650 mg by mouth every 6 (six) hours as needed for fever or headache.    . allopurinol (ZYLOPRIM) 300 MG tablet Take 300 mg by mouth daily.     Marland Kitchen ALPRAZolam (XANAX) 0.25 MG tablet Take 0.25 mg by mouth at bedtime.   0  . atenolol (TENORMIN) 50 MG tablet Take 0.5 tablets (25 mg total) by mouth daily. 30 tablet 0  . cetirizine (ZYRTEC) 10 MG tablet Take 10 mg by mouth 2 (two) times daily.     Marland Kitchen escitalopram (LEXAPRO) 10 MG tablet Take 10 mg by mouth daily.    . fluticasone (FLONASE) 50 MCG/ACT nasal spray Place 2 sprays into the nose daily.     . hydroxychloroquine (PLAQUENIL) 200 MG tablet TAKE 2 TABLETS BY MOUTH EVERY DAY    . lansoprazole (PREVACID) 30 MG capsule Take 30 mg by mouth daily at 12 noon.    Marland Kitchen levothyroxine (SYNTHROID, LEVOTHROID) 75 MCG tablet   5  . midodrine (PROAMATINE) 2.5 MG tablet Take 2 tablets (5 mg total) by mouth 2 (two) times daily with a meal. 15 tablet 0  . triamterene-hydrochlorothiazide (MAXZIDE-25) 37.5-25  MG tablet TK 1 T PO PRN    . benzonatate (TESSALON PERLES) 100 MG capsule Take 1 capsule (100 mg total) by mouth every 6 (six) hours as needed for cough. (Patient not taking: Reported on 11/17/2018) 30 capsule 0  . leflunomide (ARAVA) 10 MG tablet Take 1 tablet (10 mg total) by mouth daily. (Patient not taking: Reported on 11/17/2018)  1  . oxybutynin  (DITROPAN-XL) 10 MG 24 hr tablet Take 10 mg by mouth daily.    . sodium bicarbonate 650 MG tablet Take 1 tablet (650 mg total) by mouth 2 (two) times daily. (Patient not taking: Reported on 11/17/2018) 60 tablet 0   No current facility-administered medications for this visit.     PHYSICAL EXAMINATION: ECOG PERFORMANCE STATUS: 0 - Asymptomatic  BP 122/81   Pulse 82   Temp (!) 97.3 F (36.3 C)   Resp 18   Wt 163 lb 9.6 oz (74.2 kg)   BMI 25.62 kg/m   Filed Weights   11/17/18 1052  Weight: 163 lb 9.6 oz (74.2 kg)    Physical Exam  Constitutional: She is oriented to person, place, and time and well-developed, well-nourished, and in no distress.  HENT:  Head: Normocephalic and atraumatic.  Mouth/Throat: Oropharynx is clear and moist. No oropharyngeal exudate.  Eyes: Pupils are equal, round, and reactive to light.  Neck: Normal range of motion. Neck supple.  Cardiovascular: Normal rate and regular rhythm.  Pulmonary/Chest: No respiratory distress. She has no wheezes.  Abdominal: Soft. Bowel sounds are normal. She exhibits no distension and no mass. There is no abdominal tenderness. There is no rebound and no guarding.  Musculoskeletal: Normal range of motion.        General: No tenderness or edema.  Neurological: She is alert and oriented to person, place, and time.  Skin: Skin is warm.  Psychiatric: Affect normal.    LABORATORY DATA:  I have reviewed the data as listed    Component Value Date/Time   NA 138 11/17/2018 1020   NA 142 08/16/2013 1028   K 4.8 11/17/2018 1020   K 4.5 08/16/2013 1028   CL 110 11/17/2018 1020   CL 106 08/16/2013 1028   CO2 18 (L) 11/17/2018 1020   CO2 27 08/16/2013 1028   GLUCOSE 102 (H) 11/17/2018 1020   GLUCOSE 99 08/16/2013 1028   BUN 30 (H) 11/17/2018 1020   BUN 21 (H) 08/16/2013 1028   CREATININE 1.84 (H) 11/17/2018 1020   CREATININE 1.42 (H) 09/07/2014 0954   CALCIUM 7.6 (L) 11/17/2018 1020   CALCIUM 9.3 09/07/2014 0954   PROT 7.2  11/17/2018 1020   PROT 7.4 08/16/2013 1028   ALBUMIN 4.1 11/17/2018 1020   ALBUMIN 3.9 08/16/2013 1028   AST 25 11/17/2018 1020   AST 27 08/16/2013 1028   ALT 10 11/17/2018 1020   ALT 15 08/16/2013 1028   ALKPHOS 65 11/17/2018 1020   ALKPHOS 61 08/16/2013 1028   BILITOT 0.5 11/17/2018 1020   BILITOT 0.3 08/16/2013 1028   GFRNONAA 26 (L) 11/17/2018 1020   GFRNONAA 36 (L) 09/07/2014 0954   GFRAA 30 (L) 11/17/2018 1020   GFRAA 42 (L) 09/07/2014 0954    No results found for: SPEP, UPEP  Lab Results  Component Value Date   WBC 6.5 11/17/2018   NEUTROABS 4.4 11/17/2018   HGB 10.2 (L) 11/17/2018   HCT 32.8 (L) 11/17/2018   MCV 98.8 11/17/2018   PLT 163 11/17/2018      Chemistry  Component Value Date/Time   NA 138 11/17/2018 1020   NA 142 08/16/2013 1028   K 4.8 11/17/2018 1020   K 4.5 08/16/2013 1028   CL 110 11/17/2018 1020   CL 106 08/16/2013 1028   CO2 18 (L) 11/17/2018 1020   CO2 27 08/16/2013 1028   BUN 30 (H) 11/17/2018 1020   BUN 21 (H) 08/16/2013 1028   CREATININE 1.84 (H) 11/17/2018 1020   CREATININE 1.42 (H) 09/07/2014 0954      Component Value Date/Time   CALCIUM 7.6 (L) 11/17/2018 1020   CALCIUM 9.3 09/07/2014 0954   ALKPHOS 65 11/17/2018 1020   ALKPHOS 61 08/16/2013 1028   AST 25 11/17/2018 1020   AST 27 08/16/2013 1028   ALT 10 11/17/2018 1020   ALT 15 08/16/2013 1028   BILITOT 0.5 11/17/2018 1020   BILITOT 0.3 08/16/2013 1028       RADIOGRAPHIC STUDIES: I have personally reviewed the radiological images as listed and agreed with the findings in the report. No results found.   ASSESSMENT & PLAN:    Multiple myeloma not having achieved remission [facility]) # Multiple myeloma-Dx: 2012 s/p autologous stem cell transplant in May 2013.  Patient currently on surveillance.   #Clinically stable.  April 2020 M protein 0.2 g/dL; kappa lambda light chain ratio 2.9.  Await myeloma numbers from today.  Hemoglobin on 10 creatinine 1.8. [see  below]  #Anemia hemoglobin around 10-likely from CKD less likely from active multiple myeloma.  However discussed that if myeloma numbers started to go up bone marrow biopsy would be recommended.  For now recommend continued surveillance.  Recommend Vitron C daily.  Recent ferritin elevated.  Iron studies ordered today.  COVID- 19 [April 2020]-recovered no residual deficits noted  # CKD stage III- IVcreatinine- 1,4-2.0 stable.  [Dr.Singh]   #Hypothyroidism-TSH 6.5.  Continue current dose of Synthroid.   #Disposition: add iron studies to labs today.  #  follow-up-in 3 months-MD /CBC CMP myeloma panel kappa lambda light chain- Dr.B  Cc; Dr.Hande       Cammie Sickle, MD 11/17/2018 9:38 PM

## 2018-11-17 NOTE — Assessment & Plan Note (Addendum)
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  Patient currently on surveillance.   #Clinically stable.  April 2020 M protein 0.2 g/dL; kappa lambda light chain ratio 2.9.  Await myeloma numbers from today.  Hemoglobin on 10 creatinine 1.8. [see below]  #Anemia hemoglobin around 10-likely from CKD less likely from active multiple myeloma.  However discussed that if myeloma numbers started to go up bone marrow biopsy would be recommended.  For now recommend continued surveillance.  Recommend Vitron C daily.  Recent ferritin elevated.  Iron studies ordered today.  COVID- 19 [April 2020]-recovered no residual deficits noted  # CKD stage III- IVcreatinine- 1,4-2.0 stable.  [Dr.Singh]   #Hypothyroidism-TSH 6.5.  Continue current dose of Synthroid.   #Antiphospholipid antibody syndrome-antibody positivity; IgG [facility] & June 2020]; without any obvious sequelae of thrombotic episodes/in the context of covid infection.  Recommend repeat labs in 3 months. Continue asprin.   #Disposition: add iron studies to labs today.  #  follow-up-in 3 months-MD /CBC CMP myeloma panel kappa lambda light chain- Dr.B  Cc; Dr.Hande

## 2018-11-18 LAB — KAPPA/LAMBDA LIGHT CHAINS
Kappa free light chain: 40.1 mg/L — ABNORMAL HIGH (ref 3.3–19.4)
Kappa, lambda light chain ratio: 2.1 — ABNORMAL HIGH (ref 0.26–1.65)
Lambda free light chains: 19.1 mg/L (ref 5.7–26.3)

## 2018-11-21 LAB — MULTIPLE MYELOMA PANEL, SERUM
Albumin SerPl Elph-Mcnc: 4 g/dL (ref 2.9–4.4)
Albumin/Glob SerPl: 1.7 (ref 0.7–1.7)
Alpha 1: 0.2 g/dL (ref 0.0–0.4)
Alpha2 Glob SerPl Elph-Mcnc: 0.9 g/dL (ref 0.4–1.0)
B-Globulin SerPl Elph-Mcnc: 0.7 g/dL (ref 0.7–1.3)
Gamma Glob SerPl Elph-Mcnc: 0.6 g/dL (ref 0.4–1.8)
Globulin, Total: 2.5 g/dL (ref 2.2–3.9)
IgA: 49 mg/dL — ABNORMAL LOW (ref 64–422)
IgG (Immunoglobin G), Serum: 862 mg/dL (ref 586–1602)
IgM (Immunoglobulin M), Srm: 23 mg/dL — ABNORMAL LOW (ref 26–217)
M Protein SerPl Elph-Mcnc: 0.2 g/dL — ABNORMAL HIGH
Total Protein ELP: 6.5 g/dL (ref 6.0–8.5)

## 2018-11-27 ENCOUNTER — Other Ambulatory Visit: Payer: Self-pay

## 2018-11-27 ENCOUNTER — Emergency Department
Admission: EM | Admit: 2018-11-27 | Discharge: 2018-11-27 | Disposition: A | Discharge status: Home / Self Care | Payer: Medicare Other | Attending: Emergency Medicine | Admitting: Emergency Medicine

## 2018-11-27 ENCOUNTER — Emergency Department: Payer: Medicare Other

## 2018-11-27 ENCOUNTER — Encounter: Payer: Self-pay | Admitting: Emergency Medicine

## 2018-11-27 DIAGNOSIS — Z8579 Personal history of other malignant neoplasms of lymphoid, hematopoietic and related tissues: Secondary | ICD-10-CM | POA: Diagnosis not present

## 2018-11-27 DIAGNOSIS — Z79899 Other long term (current) drug therapy: Secondary | ICD-10-CM | POA: Insufficient documentation

## 2018-11-27 DIAGNOSIS — R079 Chest pain, unspecified: Secondary | ICD-10-CM | POA: Diagnosis present

## 2018-11-27 DIAGNOSIS — E039 Hypothyroidism, unspecified: Secondary | ICD-10-CM | POA: Insufficient documentation

## 2018-11-27 DIAGNOSIS — R0602 Shortness of breath: Secondary | ICD-10-CM | POA: Insufficient documentation

## 2018-11-27 DIAGNOSIS — Z9481 Bone marrow transplant status: Secondary | ICD-10-CM | POA: Diagnosis not present

## 2018-11-27 LAB — TROPONIN I (HIGH SENSITIVITY)
Troponin I (High Sensitivity): 4 ng/L (ref <18)
Troponin I (High Sensitivity): 4 ng/L (ref <18)

## 2018-11-27 LAB — CBC
HCT: 37.8 % (ref 36.0–46.0)
Hemoglobin: 11.7 g/dL — ABNORMAL LOW (ref 12.0–15.0)
MCH: 30.9 pg (ref 26.0–34.0)
MCHC: 31 g/dL (ref 30.0–36.0)
MCV: 99.7 fL (ref 80.0–100.0)
Platelets: 182 10*3/uL (ref 150–400)
RBC: 3.79 MIL/uL — ABNORMAL LOW (ref 3.87–5.11)
RDW: 14.7 % (ref 11.5–15.5)
WBC: 9.5 10*3/uL (ref 4.0–10.5)
nRBC: 0 % (ref 0.0–0.2)

## 2018-11-27 LAB — BASIC METABOLIC PANEL
Anion gap: 9 (ref 5–15)
BUN: 33 mg/dL — ABNORMAL HIGH (ref 8–23)
CO2: 18 mmol/L — ABNORMAL LOW (ref 22–32)
Calcium: 7.7 mg/dL — ABNORMAL LOW (ref 8.9–10.3)
Chloride: 110 mmol/L (ref 98–111)
Creatinine, Ser: 1.65 mg/dL — ABNORMAL HIGH (ref 0.44–1.00)
GFR calc Af Amer: 34 mL/min — ABNORMAL LOW (ref >60)
GFR calc non Af Amer: 29 mL/min — ABNORMAL LOW (ref >60)
Glucose, Bld: 111 mg/dL — ABNORMAL HIGH (ref 70–99)
Potassium: 4.9 mmol/L (ref 3.5–5.1)
Sodium: 137 mmol/L (ref 135–145)

## 2018-11-27 MED ORDER — ASPIRIN 81 MG PO CHEW
162.0000 mg | CHEWABLE_TABLET | Freq: Once | ORAL | Status: AC
  Administered 2018-11-27: 162 mg via ORAL
  Filled 2018-11-27: qty 2

## 2018-11-27 MED ORDER — SODIUM CHLORIDE 0.9% FLUSH
3.0000 mL | Freq: Once | INTRAVENOUS | Status: AC
  Administered 2018-11-27: 3 mL via INTRAVENOUS

## 2018-11-27 NOTE — ED Notes (Signed)
Assumed care of patient reports sudden onset of chest pain began this morning when getting ready for church. States pain radiates into jaw and neck. Continue to feel pressure. Patient placed on cardiac monitor showing sr. Trop # 2 drawn and sent.

## 2018-11-27 NOTE — Discharge Instructions (Addendum)
Call your primary care doctor tomorrow to make an appointment for the next few weeks.  Return to the ER for new, worsening, or persistent severe chest pain, difficulty breathing, weakness, or any other new or worsening symptoms that concern you.

## 2018-11-27 NOTE — ED Provider Notes (Signed)
[facility] Emergency Department Provider Note ____________________________________________   First MD Initiated Contact with Patient 11/27/18 1126     (approximate)  I have reviewed the triage vital signs and the nursing notes.   HISTORY  Chief Complaint Chest Pain    HPI Sydney Gillespie is a 78 y.o. female with PMH as noted below but no prior cardiac history who presents with chest pain, acute onset a few hours ago and now mostly resolved, described as a tightness, and radiating to her neck.  She reports some associated shortness of breath but denies nausea or significant lightheadedness.  The pain did not occur with exertion.  She does not have any prior history of this pain.  Past Medical History:  Diagnosis Date  . Allergic rhinitis   . Allergy   . Anemia   . Anemia   . Barrett's esophagus   . Blood dyscrasia    multiple myloma remission  . Cancer ([place])   . Change in bowel habits   . Compression fracture 2013  . Degenerative disc disease, lumbar   . Dysrhythmia    palpitations  . Esophageal reflux   . Family history of adverse reaction to anesthesia    nausea -mom  . GERD (gastroesophageal reflux disease)   . Gout   . 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[place])   . Heart palpitations   . Hyperlipidemia   . Hypothyroidism   . Inflammatory polyarthropathy ([place])   . Lumbago   . Lumbar radiculitis   . Lumbar stenosis with neurogenic claudication   . Multiple myeloma ([place])   . Multiple myeloma ([place])   . Osteopenia   . Osteoporosis   . Other dysphagia   . Palpitations   . Positive PPD   . Renal insufficiency   . Rheumatoid arthritis ([place])   . Rheumatoid arthritis ([place])   . Seasonal allergies   . Vertigo     Patient Active Problem List   Diagnosis Date Noted  . Syncope 09/01/2018  . Pneumonia due to COVID-19 virus 09/01/2018  . Anemia due to stage 3 chronic kidney disease ([place]) 12/28/2016  . Lumbar radiculopathy 06/18/2016  . Fatigue  01/29/2016  . Low serum vitamin D 01/29/2016  . Multiple myeloma not having achieved remission ([place]) 12/18/2015  . Spondylolisthesis of lumbar region 11/25/2015  . Soft tissue lesion of shoulder region 09/11/2015  . Acid reflux 09/11/2015  . Rheumatoid arthritis involving multiple joints ([place]) 09/11/2015  . 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 Degeneration of intervertebral disc of lumbar region 10/18/2013  . Neuritis or radiculitis due to rupture of lumbar intervertebral disc 10/18/2013  . Degenerative arthritis of lumbar spine 10/18/2013  . Allergic rhinitis 09/27/2013  . HLD (hyperlipidemia) 09/27/2013  . Awareness of heartbeats 09/27/2013  . Gout 09/21/2013  . Abnormal result of Mantoux test 09/21/2013  . Impaired renal function 09/21/2013  . 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  . OP (osteoporosis) 10/13/2011  . 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 Rheumatoid arthritis ([place]) 09/23/2011    Past Surgical History:  Procedure Laterality Date  . ABDOMINAL HYSTERECTOMY    . APPENDECTOMY    . BACK SURGERY    . BREAST EXCISIONAL BIOPSY Bilateral    neg  . CHOLECYSTECTOMY    . COLONOSCOPY WITH PROPOFOL N/A 11/12/2016   Procedure: COLONOSCOPY WITH PROPOFOL;  Surgeon: Manya Silvas, MD;  Location: [facility] ENDOSCOPY;  Service: Endoscopy;  Laterality: N/A;  . ESOPHAGOGASTRODUODENOSCOPY (EGD) WITH PROPOFOL N/A 11/12/2016   Procedure:  ESOPHAGOGASTRODUODENOSCOPY (EGD) WITH PROPOFOL;  Surgeon: Manya Silvas, MD;  Location: [facility] ENDOSCOPY;  Service: Endoscopy;  Laterality: N/A;  . FOOT SURGERY    . LUMBAR LAMINECTOMY/DECOMPRESSION MICRODISCECTOMY Left 06/18/2016   Procedure: LAMINECTOMY FOR FACET/SYNOVIAL CYST LUMBAR THREE - LUMBAR FOUR LEFT;  Surgeon: Newman Pies, MD;  Location: [place];  Service: Neurosurgery;  Laterality: Left;  LAMINECTOMY FOR FACET/SYNOVIAL CYST LUMBAR THREE - LUMBAR FOUR LEFT    Prior to Admission medications   Medication Sig Start Date End  Date Taking? Authorizing Provider  acetaminophen (TYLENOL) 325 MG tablet Take 650 mg by mouth every 6 (six) hours as needed for fever or headache.    [provider]  allopurinol (ZYLOPRIM) 300 MG tablet Take 300 mg by mouth daily.  09/14/14   [provider]  ALPRAZolam Duanne Moron) 0.25 MG tablet Take 0.25 mg by mouth at bedtime.  10/18/15   [provider]  atenolol (TENORMIN) 50 MG tablet Take 0.5 tablets (25 mg total) by mouth daily. 09/05/18   Ghimire, Henreitta Leber, MD  benzonatate (TESSALON PERLES) 100 MG capsule Take 1 capsule (100 mg total) by mouth every 6 (six) hours as needed for cough. Patient not taking: Reported on 11/17/2018 09/05/18 09/05/19  Jonetta Osgood, MD  cetirizine (ZYRTEC) 10 MG tablet Take 10 mg by mouth 2 (two) times daily.     [provider]  escitalopram (LEXAPRO) 10 MG tablet Take 10 mg by mouth daily.    [provider]  fluticasone (FLONASE) 50 MCG/ACT nasal spray Place 2 sprays into the nose daily.     [provider]  hydroxychloroquine (PLAQUENIL) 200 MG tablet TAKE 2 TABLETS BY MOUTH EVERY DAY 10/25/18   [provider]  lansoprazole (PREVACID) 30 MG capsule Take 30 mg by mouth daily at 12 noon.    [provider]  leflunomide (ARAVA) 10 MG tablet Take 1 tablet (10 mg total) by mouth daily. Patient not taking: Reported on 11/17/2018 09/13/18   Jonetta Osgood, MD  levothyroxine (SYNTHROID, LEVOTHROID) 75 MCG tablet  11/02/17   [provider]  midodrine (PROAMATINE) 2.5 MG tablet Take 2 tablets (5 mg total) by mouth 2 (two) times daily with a meal. 09/05/18   Ghimire, Henreitta Leber, MD  oxybutynin (DITROPAN-XL) 10 MG 24 hr tablet Take 10 mg by mouth daily. 06/09/18   [provider]  sodium bicarbonate 650 MG tablet Take 1 tablet (650 mg total) by mouth 2 (two) times daily. Patient not taking: Reported on 11/17/2018 09/05/18   Jonetta Osgood, MD  triamterene-hydrochlorothiazide (MAXZIDE-25)  37.5-25 MG tablet TK 1 T PO PRN 09/18/18   [provider]    Allergies Isoniazid and Keflex [cephalexin]  Family History  Problem Relation Age of Onset  . Hypertension Mother   . Heart attack Mother   . Rheum arthritis Mother   . Osteoarthritis Mother   . Cancer Father   . Hypertension Father   . Stroke Father   . Breast cancer Maternal Grandmother 29  . Osteoarthritis Other   . Rheum arthritis Other     Social History Social History   Tobacco Use  . Smoking status: Never Smoker  . Smokeless tobacco: Never Used  Substance Use Topics  . Alcohol use: No  . Drug use: No    Review of Systems  Constitutional: No fever. Eyes: No redness. ENT: No sore throat. Cardiovascular: Positive for resolved chest pain. Respiratory: Positive for resolved shortness of breath. Gastrointestinal: No vomiting or diarrhea.  Genitourinary: Negative for flank pain.  Musculoskeletal: Negative for back pain. Skin: Negative for rash. Neurological: Negative for headache.   ____________________________________________   PHYSICAL EXAM:  VITAL SIGNS: ED Triage Vitals  Enc Vitals Group     BP 11/27/18 0925 124/62     Pulse Rate 11/27/18 0925 77     Resp 11/27/18 0925 (!) 22     Temp 11/27/18 0925 98.2 F (36.8 C)     Temp Source 11/27/18 0925 Oral     SpO2 11/27/18 0925 97 %     Weight 11/27/18 0922 163 lb 9.6 oz (74.2 kg)     Height --      Head Circumference --      Peak Flow --      Pain Score 11/27/18 0922 8     Pain Loc --      Pain Edu? --      Excl. in [place]? --     Constitutional: Alert and oriented. Well appearing for age and in no acute distress. Eyes: Conjunctivae are normal.  Head: Atraumatic. Nose: No congestion/rhinnorhea. Mouth/Throat: Mucous membranes are moist.   Neck: Normal range of motion.  Cardiovascular: Normal rate, regular rhythm. Good peripheral circulation. Respiratory: Normal respiratory effort.  No retractions.  Gastrointestinal: No  distention.  Musculoskeletal: No lower extremity edema.  Extremities warm and well perfused.  Neurologic:  Normal speech and language. No gross focal neurologic deficits are appreciated.  Skin:  Skin is warm and dry. No rash noted. Psychiatric: Mood and affect are normal. Speech and behavior are normal.  ____________________________________________   LABS (all labs ordered are listed, but only abnormal results are displayed)  Labs Reviewed  BASIC METABOLIC PANEL - Abnormal; Notable for the following components:      Result Value   CO2 18 (*)    Glucose, Bld 111 (*)    BUN 33 (*)    Creatinine, Ser 1.65 (*)    Calcium 7.7 (*)    GFR calc non Af Amer 29 (*)    GFR calc Af Amer 34 (*)    All other components within normal limits  CBC - Abnormal; Notable for the following components:   RBC 3.79 (*)    Hemoglobin 11.7 (*)    All other components within normal limits  TROPONIN I (HIGH SENSITIVITY)  TROPONIN I (HIGH SENSITIVITY)   ____________________________________________  EKG  ED ECG REPORT I, Arta Silence, the attending physician, personally viewed and interpreted this ECG.  Date: 11/27/2018 EKG Time: 0928 Rate: 76 Rhythm: normal sinus rhythm QRS Axis: normal Intervals: normal ST/T Wave abnormalities: normal Narrative Interpretation: no evidence of acute ischemia  ____________________________________________  RADIOLOGY  CXR: No focal infiltrate or other acute abnormality  ____________________________________________   PROCEDURES  Procedure(s) performed: No  Procedures  Critical Care performed: No ____________________________________________   INITIAL IMPRESSION / ASSESSMENT AND PLAN / ED COURSE  Pertinent labs & imaging results that were available during my care of the patient were reviewed by me and considered in my medical decision making (see chart for details).  78 year old female with no prior cardiac history presents with nonexertional  chest pain acute onset this morning that has almost completely resolved on its own without intervention.  She has no prior history of this pain.  I reviewed the past medical records in epic.  The patient was most recently admitted in April of this year for pneumonia due to COVID-19, but this has subsequently resolved and she has been doing well.  On exam  she is well-appearing for her age.  Her vital signs are normal.  The remainder of the physical exam is unremarkable.  EKG is nonischemic.  Overall given the lack of CAD history and the spontaneously resolved pain and normal EKG, I have a very low suspicion for ACS.  We will obtain a second troponin, chest x-ray, basic labs and reassess.  There is no clinical evidence for DVT or PE, and nothing to suggest aortic dissection or other vascular etiology especially given that the pain has resolved spontaneously.  ----------------------------------------- 12:50 PM on 11/27/2018 -----------------------------------------  Initial and repeat troponin are both negative.  The patient remains pain-free at this time.  She is stable for discharge home.  I counseled her on the results of the work-up.  I instructed her to call her PMD tomorrow to arrange for a follow-up appointment.  Return precautions given, and she expresses understanding.  ___________________________  [name] was evaluated in Emergency Department on 11/27/2018 for the symptoms described in the history of present illness. She was evaluated in the context of the global COVID-19 pandemic, which necessitated consideration that the patient might be at risk for infection with the SARS-CoV-2 virus that causes COVID-19. Institutional protocols and algorithms that pertain to the evaluation of patients at risk for COVID-19 are in a state of rapid change based on information released by regulatory bodies including the CDC and federal and state organizations. These policies and algorithms were  followed during the patient's care in the ED.  ____________________________________________   FINAL CLINICAL IMPRESSION(S) / ED DIAGNOSES  Final diagnoses:  Nonspecific chest pain      NEW MEDICATIONS STARTED DURING THIS VISIT:  Discharge Medication List as of 11/27/2018 12:50 PM       Note:  This document was prepared using Dragon voice recognition software and may include unintentional dictation errors.   Arta Silence, MD 11/27/18 1318

## 2018-11-27 NOTE — ED Triage Notes (Addendum)
Pt presents to ED via POV with c/o 8/10 CP and SOB that started approx 30-45 mins PTA. Pt states pain radiates up her neck and describes as a pressure.   Pt states dx with Covid 19 in April of this year.

## 2018-11-27 NOTE — ED Notes (Signed)
ASA given as ordered. Patient given phone to update husband whom is waiting for her in waiting room.

## 2018-12-02 ENCOUNTER — Other Ambulatory Visit: Payer: Self-pay | Admitting: Internal Medicine

## 2018-12-02 DIAGNOSIS — R200 Anesthesia of skin: Secondary | ICD-10-CM

## 2018-12-02 DIAGNOSIS — M48062 Spinal stenosis, lumbar region with neurogenic claudication: Secondary | ICD-10-CM

## 2018-12-15 ENCOUNTER — Other Ambulatory Visit: Payer: Self-pay

## 2018-12-15 ENCOUNTER — Ambulatory Visit
Admission: RE | Admit: 2018-12-15 | Discharge: 2018-12-15 | Disposition: A | Discharge status: Home / Self Care | Payer: Medicare Other | Source: Ambulatory Visit | Attending: Internal Medicine | Admitting: Internal Medicine

## 2018-12-15 DIAGNOSIS — R200 Anesthesia of skin: Secondary | ICD-10-CM | POA: Diagnosis present

## 2018-12-15 DIAGNOSIS — M48062 Spinal stenosis, lumbar region with neurogenic claudication: Secondary | ICD-10-CM | POA: Insufficient documentation

## 2019-01-20 ENCOUNTER — Other Ambulatory Visit: Payer: Self-pay | Admitting: Urology

## 2019-01-20 MED ORDER — OXYBUTYNIN CHLORIDE ER 10 MG PO TB24: 10.0000 mg | ORAL_TABLET | Freq: Every day | ORAL | 1 refills | Status: DC

## 2019-01-20 NOTE — Telephone Encounter (Signed)
Pt needs refill for Oxybutnin called in, she is completely out. She has a follow up appt with Macdiarmid on 02/13/2019.

## 2019-01-20 NOTE — Telephone Encounter (Signed)
Patient informed-RX sent in.

## 2019-02-13 ENCOUNTER — Ambulatory Visit (INDEPENDENT_AMBULATORY_CARE_PROVIDER_SITE_OTHER): Payer: Medicare Other | Admitting: Urology

## 2019-02-13 ENCOUNTER — Other Ambulatory Visit: Payer: Self-pay

## 2019-02-13 ENCOUNTER — Encounter: Payer: Self-pay | Admitting: Urology

## 2019-02-13 VITALS — BP 132/72 | HR 82 | Ht 67.0 in | Wt 165.0 lb

## 2019-02-13 DIAGNOSIS — N3946 Mixed incontinence: Secondary | ICD-10-CM

## 2019-02-13 MED ORDER — OXYBUTYNIN CHLORIDE ER 10 MG PO TB24: 10.0000 mg | ORAL_TABLET | Freq: Every day | ORAL | 11 refills | Status: DC

## 2019-02-13 NOTE — Progress Notes (Signed)
02/13/2019 8:30 AM   Sydney Gillespie 1941-03-26 [id number]  Referring provider: [name], MD [address] [facility] [place],  [place] [id number]  Chief Complaint  Patient presents with   Follow-up    HPI: She leaks with coughing sneezing bending and lifting. She has urge incontinence. Both are significant. She says her main complaint is when she goes from a sitting to standing position the urine just comes out. She wears 3 or 4 pads a day that can be soaked. She has moderately severe bedwetting.  Mild hypermobility of the bladder neck and a mild positive cough test with no prolapse  The patient has mixed incontinence. I think both likely are significant. I wonder if the overactive bladder component is the most significant. She does have milder to moderate frequency and nocturia. She also has moderately severe bedwetting. She likely is triggering overactivity when she goes from a sitting to standing position but she does have reasonably significant stress incontinence.If the patient ever did have her stress incontinence treated urethral injectables or sling would be reasonable  On urodynamics the patient did not void and was catheterized for 75 mL. Bladder capacity was 220 mL. The patient hadsensoryurgency. The bladder was unstable reaching pressures of 10 cm of water associated with severe leakage. She was triggering a lot of overactivity when she would cough. It made assessment of stress incontinence more difficult.At150 mL her cough leak point pressure is 58 cm of water associated with mild to moderate leakage. There was a lot of triggering. During voluntary voiding she voided 57 mL with a maximum flow of 8 mils per second and a residual was 100 mL. Bladder neck descent at 1 cm. The leakage was severe with her bladder overactivity. It was moderate in severity with stress maneuvers.  In my opinion approximately 80% of the patient's  bladder dysfunction is due to an overactive bladder. Medical and behavioral therapy discussed including physical therapy. If she does not reach her goal one would consider an outlet procedure but I would want to re-quantitate her goals and stress incontinence recognizing that arefractory overactive bladder therapy may be prudent to consider instead, especially with her bedwetting and triggering with standing.Reassess on Myrbetriq in 5 weeks  Patient on Myrbetriq has improved and now wears 2 pads a day instead of 3 or 4.   Would like to see her in 5 weeks on Toviaz 8 mg samples and Myrbetriq 50 mg. This is double therapy. I will take away the Myrbetriq if she is doing great. I will discuss surgery versus refractory therapies versus a trial of another antimuscarinic as add on double therapy with Myrbetriqnext time.  The patient's mixed incontinence is 80% better. At night she is dramatically better and only wears a liner. When the Myrbetriq samples stopped she started leaking a lot again high volume. She is very pleased. She can live with a stress incontinence.   Patient still leaks with activity and there was one high-volume episode this is uncommon.  She said the stress incontinence is worse in the residual urge incontinence.  She still wears 1 and sometimes 2 pads a day and agrees overall for her mixed incontinence she is about 80% better and reasonably pleased.  She was wondering what a sling in detail.  I drew her a picture.  I went through my entire template in detail.  We talked about mesh issues.  We talked about overactive bladder concerns with persistent or worsening symptoms.  Mixed incontinence.  Patient now on oxybutynin and Myrbetriq.  I will see her in 1 year.  She will call if she like to schedule surgery.  She is leaning towards not doing surgery  Today Frequency stable.  Clinically not infected.  Doing much better on oxybutynin monotherapy because Myrbetriq was  expensive prescription renewed and see in 1 year   PMH: Past Medical History:  Diagnosis Date   Allergic rhinitis    Allergy    Anemia    Anemia    Barrett's esophagus    Blood dyscrasia    multiple myloma remission   Cancer ([place])    Change in bowel habits    Compression fracture 2013   Degenerative disc disease, lumbar    Dysrhythmia    palpitations   Esophageal reflux    Family history of adverse reaction to anesthesia    nausea -mom   GERD (gastroesophageal reflux disease)    Gout    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[place])    Heart palpitations    Hyperlipidemia    Hypothyroidism    Inflammatory polyarthropathy ([facility])    Lumbago    Lumbar radiculitis    Lumbar stenosis with neurogenic claudication    Multiple myeloma ([facility])    Multiple myeloma ([place])    Osteopenia    Osteoporosis    Other dysphagia    Palpitations    Positive PPD    Renal insufficiency    Rheumatoid arthritis ([facility])    Rheumatoid arthritis ([facility])    Seasonal allergies    Vertigo     Surgical History: Past Surgical History:  Procedure Laterality Date   ABDOMINAL HYSTERECTOMY     APPENDECTOMY     BACK SURGERY     BREAST EXCISIONAL BIOPSY Bilateral    neg   CHOLECYSTECTOMY     COLONOSCOPY WITH PROPOFOL N/A 11/12/2016   Procedure: COLONOSCOPY WITH PROPOFOL;  Surgeon: Manya Silvas, MD;  Location: [facility] ENDOSCOPY;  Service: Endoscopy;  Laterality: N/A;   ESOPHAGOGASTRODUODENOSCOPY (EGD) WITH PROPOFOL N/A 11/12/2016   Procedure: ESOPHAGOGASTRODUODENOSCOPY (EGD) WITH PROPOFOL;  Surgeon: Manya Silvas, MD;  Location: [facility] ENDOSCOPY;  Service: Endoscopy;  Laterality: N/A;   FOOT SURGERY     LUMBAR LAMINECTOMY/DECOMPRESSION MICRODISCECTOMY Left 06/18/2016   Procedure: LAMINECTOMY FOR FACET/SYNOVIAL CYST LUMBAR THREE - LUMBAR FOUR LEFT;  Surgeon: Newman Pies, MD;  Location: [facility];  Service: Neurosurgery;  Laterality: Left;  LAMINECTOMY FOR FACET/SYNOVIAL CYST  LUMBAR THREE - LUMBAR FOUR LEFT    Home Medications:  Allergies as of 02/13/2019      Reactions   Isoniazid Other (See Comments)   Blisters    Keflex [cephalexin] Other (See Comments)   Blisters      Medication List       Accurate as of February 13, 2019  8:30 AM. If you have any questions, ask your nurse or doctor.        acetaminophen 325 MG tablet Commonly known as: TYLENOL Take 650 mg by mouth every 6 (six) hours as needed for fever or headache.   allopurinol 300 MG tablet Commonly known as: ZYLOPRIM Take 300 mg by mouth daily.   ALPRAZolam 0.25 MG tablet Commonly known as: XANAX Take 0.25 mg by mouth at bedtime.   atenolol 50 MG tablet Commonly known as: TENORMIN Take 0.5 tablets (25 mg total) by mouth daily.   benzonatate 100 MG capsule Commonly known as: Tessalon Perles Take 1 capsule (100 mg total) by mouth every 6 (six) hours as needed for cough.  cetirizine 10 MG tablet Commonly known as: ZYRTEC Take 10 mg by mouth 2 (two) times daily.   escitalopram 10 MG tablet Commonly known as: LEXAPRO Take 10 mg by mouth daily.   fluticasone 50 MCG/ACT nasal spray Commonly known as: FLONASE Place 2 sprays into the nose daily.   hydroxychloroquine 200 MG tablet Commonly known as: PLAQUENIL TAKE 2 TABLETS BY MOUTH EVERY DAY   lansoprazole 30 MG capsule Commonly known as: PREVACID Take 30 mg by mouth daily at 12 noon.   leflunomide 10 MG tablet Commonly known as: ARAVA Take 1 tablet (10 mg total) by mouth daily.   levothyroxine 75 MCG tablet Commonly known as: SYNTHROID   midodrine 2.5 MG tablet Commonly known as: PROAMATINE Take 2 tablets (5 mg total) by mouth 2 (two) times daily with a meal.   oxybutynin 10 MG 24 hr tablet Commonly known as: DITROPAN-XL Take 1 tablet (10 mg total) by mouth daily.   sodium bicarbonate 650 MG tablet Take 1 tablet (650 mg total) by mouth 2 (two) times daily.   triamterene-hydrochlorothiazide 37.5-25 MG  tablet Commonly known as: MAXZIDE-25 TK 1 T PO PRN       Allergies:  Allergies  Allergen Reactions   Isoniazid Other (See Comments)    Blisters    Keflex [Cephalexin] Other (See Comments)    Blisters     Family History: Family History  Problem Relation Age of Onset   Hypertension Mother    Heart attack Mother    Rheum arthritis Mother    Osteoarthritis Mother    Cancer Father    Hypertension Father    Stroke Father    Breast cancer Maternal Grandmother 40   Osteoarthritis Other    Rheum arthritis Other     Social History:  reports that she has never smoked. She has never used smokeless tobacco. She reports that she does not drink alcohol or use drugs.  ROS:                                        Physical Exam: There were no vitals taken for this visit.  Constitutional:  Alert and oriented, No acute distress.   Laboratory Data: Lab Results  Component Value Date   WBC 9.5 11/27/2018   HGB 11.7 (L) 11/27/2018   HCT 37.8 11/27/2018   MCV 99.7 11/27/2018   PLT 182 11/27/2018    Lab Results  Component Value Date   CREATININE 1.65 (H) 11/27/2018    No results found for: PSA  No results found for: TESTOSTERONE  No results found for: HGBA1C  Urinalysis    Component Value Date/Time   APPEARANCEUR Clear 07/12/2017 1407   GLUCOSEU Negative 07/12/2017 1407   BILIRUBINUR Negative 07/12/2017 1407   PROTEINUR Negative 07/12/2017 1407   NITRITE Negative 07/12/2017 1407   LEUKOCYTESUR Negative 07/12/2017 1407    Pertinent Imaging:   Assessment & Plan: Reassess 1 year  There are no diagnoses linked to this encounter.  No follow-ups on file.  Reece Packer, MD  [facility] [address], [place], [place] [address] [phone]

## 2019-02-15 ENCOUNTER — Encounter: Payer: Self-pay | Admitting: Internal Medicine

## 2019-02-15 ENCOUNTER — Other Ambulatory Visit: Payer: Self-pay | Admitting: *Deleted

## 2019-02-15 ENCOUNTER — Other Ambulatory Visit: Payer: Self-pay

## 2019-02-15 DIAGNOSIS — C9000 Multiple myeloma not having achieved remission: Secondary | ICD-10-CM

## 2019-02-15 NOTE — Progress Notes (Signed)
Patient pre screened for office appointment, no questions or concerns today. 

## 2019-02-16 ENCOUNTER — Inpatient Hospital Stay: Payer: Medicare Other | Attending: Internal Medicine

## 2019-02-16 ENCOUNTER — Inpatient Hospital Stay (HOSPITAL_BASED_OUTPATIENT_CLINIC_OR_DEPARTMENT_OTHER): Payer: Medicare Other | Admitting: Internal Medicine

## 2019-02-16 ENCOUNTER — Other Ambulatory Visit: Payer: Self-pay

## 2019-02-16 DIAGNOSIS — G629 Polyneuropathy, unspecified: Secondary | ICD-10-CM | POA: Insufficient documentation

## 2019-02-16 DIAGNOSIS — C9000 Multiple myeloma not having achieved remission: Secondary | ICD-10-CM

## 2019-02-16 DIAGNOSIS — D6861 Antiphospholipid syndrome: Secondary | ICD-10-CM | POA: Diagnosis not present

## 2019-02-16 DIAGNOSIS — M545 Low back pain: Secondary | ICD-10-CM | POA: Diagnosis not present

## 2019-02-16 DIAGNOSIS — N183 Chronic kidney disease, stage 3 unspecified: Secondary | ICD-10-CM | POA: Diagnosis not present

## 2019-02-16 DIAGNOSIS — Z9481 Bone marrow transplant status: Secondary | ICD-10-CM | POA: Diagnosis not present

## 2019-02-16 DIAGNOSIS — Z881 Allergy status to other antibiotic agents status: Secondary | ICD-10-CM | POA: Diagnosis not present

## 2019-02-16 DIAGNOSIS — D649 Anemia, unspecified: Secondary | ICD-10-CM | POA: Diagnosis not present

## 2019-02-16 DIAGNOSIS — M069 Rheumatoid arthritis, unspecified: Secondary | ICD-10-CM | POA: Diagnosis not present

## 2019-02-16 DIAGNOSIS — Z79899 Other long term (current) drug therapy: Secondary | ICD-10-CM | POA: Insufficient documentation

## 2019-02-16 DIAGNOSIS — E039 Hypothyroidism, unspecified: Secondary | ICD-10-CM | POA: Insufficient documentation

## 2019-02-16 DIAGNOSIS — R5383 Other fatigue: Secondary | ICD-10-CM | POA: Insufficient documentation

## 2019-02-16 DIAGNOSIS — G8929 Other chronic pain: Secondary | ICD-10-CM | POA: Insufficient documentation

## 2019-02-16 DIAGNOSIS — C9001 Multiple myeloma in remission: Secondary | ICD-10-CM | POA: Insufficient documentation

## 2019-02-16 DIAGNOSIS — M791 Myalgia, unspecified site: Secondary | ICD-10-CM | POA: Diagnosis not present

## 2019-02-16 LAB — COMPREHENSIVE METABOLIC PANEL
ALT: 13 U/L (ref 0–44)
AST: 32 U/L (ref 15–41)
Albumin: 4.3 g/dL (ref 3.5–5.0)
Alkaline Phosphatase: 50 U/L (ref 38–126)
Anion gap: 7 (ref 5–15)
BUN: 36 mg/dL — ABNORMAL HIGH (ref 8–23)
CO2: 19 mmol/L — ABNORMAL LOW (ref 22–32)
Calcium: 8.9 mg/dL (ref 8.9–10.3)
Chloride: 112 mmol/L — ABNORMAL HIGH (ref 98–111)
Creatinine, Ser: 1.83 mg/dL — ABNORMAL HIGH (ref 0.44–1.00)
GFR calc Af Amer: 30 mL/min — ABNORMAL LOW (ref >60)
GFR calc non Af Amer: 26 mL/min — ABNORMAL LOW (ref >60)
Glucose, Bld: 116 mg/dL — ABNORMAL HIGH (ref 70–99)
Potassium: 4.5 mmol/L (ref 3.5–5.1)
Sodium: 138 mmol/L (ref 135–145)
Total Bilirubin: 0.4 mg/dL (ref 0.3–1.2)
Total Protein: 7.3 g/dL (ref 6.5–8.1)

## 2019-02-16 LAB — CBC WITH DIFFERENTIAL/PLATELET
Abs Immature Granulocytes: 0.02 10*3/uL (ref 0.00–0.07)
Basophils Absolute: 0.1 10*3/uL (ref 0.0–0.1)
Basophils Relative: 1 %
Eosinophils Absolute: 0.3 10*3/uL (ref 0.0–0.5)
Eosinophils Relative: 5 %
HCT: 36.2 % (ref 36.0–46.0)
Hemoglobin: 11.5 g/dL — ABNORMAL LOW (ref 12.0–15.0)
Immature Granulocytes: 0 %
Lymphocytes Relative: 22 %
Lymphs Abs: 1.3 10*3/uL (ref 0.7–4.0)
MCH: 31 pg (ref 26.0–34.0)
MCHC: 31.8 g/dL (ref 30.0–36.0)
MCV: 97.6 fL (ref 80.0–100.0)
Monocytes Absolute: 0.4 10*3/uL (ref 0.1–1.0)
Monocytes Relative: 7 %
Neutro Abs: 3.8 10*3/uL (ref 1.7–7.7)
Neutrophils Relative %: 65 %
Platelets: 158 10*3/uL (ref 150–400)
RBC: 3.71 MIL/uL — ABNORMAL LOW (ref 3.87–5.11)
RDW: 13.2 % (ref 11.5–15.5)
WBC: 5.8 10*3/uL (ref 4.0–10.5)
nRBC: 0 % (ref 0.0–0.2)

## 2019-02-16 NOTE — Progress Notes (Signed)
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  Patient has not had any further complications from COVID i infection/pneumonia. Patient denies any shortness of breath or cough.  Patient noted to have worsening pain in her lower back.  This led to an MRI.  She also noted to have worsening tingling numbness in extremities.  She had an EMG suggestive of neuropathy.  Awaiting evaluation with neurology.  She is awaiting steroid injections with pain management.    Review of Systems  Constitutional: Positive for malaise/fatigue. Negative for diaphoresis, fever and weight loss.       Cold intolerance.  HENT: Negative for nosebleeds and sore throat.   Eyes: Negative for double vision.  Respiratory: Negative for cough, hemoptysis, sputum production, shortness of breath and wheezing.   Cardiovascular: Negative for chest pain, palpitations, orthopnea and leg swelling.  Gastrointestinal: Negative for abdominal pain, blood in stool, constipation, diarrhea, heartburn, melena, nausea and vomiting.  Genitourinary: Negative for dysuria, frequency and urgency.  Musculoskeletal:  Positive for back pain, joint pain and myalgias.  Skin: Negative.  Negative for itching and rash.  Neurological: Positive for tingling. Negative for dizziness, focal weakness, weakness and headaches.  Endo/Heme/Allergies: Does not bruise/bleed easily.  Psychiatric/Behavioral: Negative for depression. The patient is not nervous/anxious and does not have insomnia.     ALLERGIES:  is allergic to isoniazid and keflex [cephalexin].  MEDICATIONS:  Current Outpatient Medications  Medication Sig Dispense Refill  . acetaminophen (TYLENOL) 325 MG tablet Take 650 mg by mouth every 6 (six) hours as needed for fever or headache.    . allopurinol (ZYLOPRIM) 300 MG tablet Take 300 mg by mouth daily.     Marland Kitchen ALPRAZolam (XANAX) 0.25 MG tablet Take 0.25 mg by mouth at bedtime.   0  . atenolol (TENORMIN) 50 MG tablet Take 0.5 tablets (25 mg total) by mouth daily. 30 tablet 0  . cetirizine (ZYRTEC) 10 MG tablet Take 10 mg by mouth 2 (two) times daily.     Marland Kitchen escitalopram (LEXAPRO) 10 MG tablet Take 10 mg by mouth daily.    . fluticasone (FLONASE) 50 MCG/ACT nasal spray Place 2 sprays into the nose daily.     . hydroxychloroquine (PLAQUENIL) 200 MG tablet TAKE 2 TABLETS BY MOUTH EVERY DAY    . lansoprazole (PREVACID) 30 MG capsule Take 30 mg by mouth daily at 12 noon.    . leflunomide (ARAVA) 10 MG tablet Take 1 tablet (10 mg total) by mouth daily.  1  . levothyroxine (SYNTHROID, LEVOTHROID) 75 MCG tablet   5  . oxybutynin (DITROPAN-XL) 10 MG 24 hr tablet Take 1 tablet (10 mg total) by mouth daily. 30 tablet  11  . triamterene-hydrochlorothiazide (MAXZIDE-25) 37.5-25 MG tablet TK 1 T PO PRN     No current facility-administered medications for this visit.     PHYSICAL EXAMINATION: ECOG PERFORMANCE STATUS: 0 - Asymptomatic  BP 127/72 (BP Location: Left Arm, Patient Position: Sitting, Cuff Size: Normal)   Pulse 74   Temp (!) 97.3 F (36.3 C) (Tympanic)   Resp 20   Ht '5\' 7"'  (1.702 m)   Wt 167 lb (75.8 kg)    BMI 26.16 kg/m   Filed Weights   02/16/19 1053  Weight: 167 lb (75.8 kg)    Physical Exam  Constitutional: She is oriented to person, place, and time and well-developed, well-nourished, and in no distress.  HENT:  Head: Normocephalic and atraumatic.  Mouth/Throat: Oropharynx is clear and moist. No oropharyngeal exudate.  Eyes: Pupils are equal, round, and reactive to light.  Neck: Normal range of motion. Neck supple.  Cardiovascular: Normal rate and regular rhythm.  Pulmonary/Chest: No respiratory distress. She has no wheezes.  Abdominal: Soft. Bowel sounds are normal. She exhibits no distension and no mass. There is no abdominal tenderness. There is no rebound and no guarding.  Musculoskeletal: Normal range of motion.        General: No tenderness or edema.  Neurological: She is alert and oriented to person, place, and time.  Skin: Skin is warm.  Psychiatric: Affect normal.    LABORATORY DATA:  I have reviewed the data as listed    Component Value Date/Time   NA 138 02/16/2019 1039   NA 142 08/16/2013 1028   K 4.5 02/16/2019 1039   K 4.5 08/16/2013 1028   CL 112 (H) 02/16/2019 1039   CL 106 08/16/2013 1028   CO2 19 (L) 02/16/2019 1039   CO2 27 08/16/2013 1028   GLUCOSE 116 (H) 02/16/2019 1039   GLUCOSE 99 08/16/2013 1028   BUN 36 (H) 02/16/2019 1039   BUN 21 (H) 08/16/2013 1028   CREATININE 1.83 (H) 02/16/2019 1039   CREATININE 1.42 (H) 09/07/2014 0954   CALCIUM 8.9 02/16/2019 1039   CALCIUM 9.3 09/07/2014 0954   PROT 7.3 02/16/2019 1039   PROT 7.4 08/16/2013 1028   ALBUMIN 4.3 02/16/2019 1039   ALBUMIN 3.9 08/16/2013 1028   AST 32 02/16/2019 1039   AST 27 08/16/2013 1028   ALT 13 02/16/2019 1039   ALT 15 08/16/2013 1028   ALKPHOS 50 02/16/2019 1039   ALKPHOS 61 08/16/2013 1028   BILITOT 0.4 02/16/2019 1039   BILITOT 0.3 08/16/2013 1028   GFRNONAA 26 (L) 02/16/2019 1039   GFRNONAA 36 (L) 09/07/2014 0954   GFRAA 30 (L) 02/16/2019 1039   GFRAA 42 (L)  09/07/2014 0954    No results found for: SPEP, UPEP  Lab Results  Component Value Date   WBC 5.8 02/16/2019   NEUTROABS 3.8 02/16/2019   HGB 11.5 (L) 02/16/2019   HCT 36.2 02/16/2019   MCV 97.6 02/16/2019   PLT 158 02/16/2019      Chemistry      Component Value Date/Time   NA 138 02/16/2019 1039   NA 142 08/16/2013 1028   K 4.5 02/16/2019 1039   K 4.5 08/16/2013 1028   CL 112 (H) 02/16/2019 1039   CL 106 08/16/2013 1028   CO2 19 (L) 02/16/2019 1039   CO2 27 08/16/2013 1028   BUN 36 (H) 02/16/2019 1039   BUN 21 (H) 08/16/2013 1028   CREATININE 1.83 (H) 02/16/2019 1039   CREATININE 1.42 (H) 09/07/2014 4268  Component Value Date/Time   CALCIUM 8.9 02/16/2019 1039   CALCIUM 9.3 09/07/2014 0954   ALKPHOS 50 02/16/2019 1039   ALKPHOS 61 08/16/2013 1028   AST 32 02/16/2019 1039   AST 27 08/16/2013 1028   ALT 13 02/16/2019 1039   ALT 15 08/16/2013 1028   BILITOT 0.4 02/16/2019 1039   BILITOT 0.3 08/16/2013 1028       RADIOGRAPHIC STUDIES: I have personally reviewed the radiological images as listed and agreed with the findings in the report. No results found.   ASSESSMENT & PLAN:    Multiple myeloma not having achieved remission [facility]) # Multiple myeloma-Dx: 2012 s/p autologous stem cell transplant in May 2013.  Patient currently on surveillance.   #Clinically stable April 2020 M protein 0.2 g/dL; kappa lambda light chain ratio 2.9.  Await myeloma numbers from today.  #Anemia hemoglobin 11 likely from CKD less likely from active multiple myeloma.  Await myeloma numbers from  # chronic back pain-worsening.  Recent MRI lumbar negative for any active myeloma process.  Patient is followed by pain management.  Awaiting injections.  #Peripheral neuropathy-status post EMG question etiology.  Defer to neurology  # CKD stage III- IVcreatinine- 1,4-2.0 stable.  [Dr.Singh]   #Hypothyroidism-TSH 6.5.  Continue current dose of Synthroid.   #Antiphospholipid antibody  syndrome-antibody positivity; IgG [facility] & June 2020]-  Continue asprin. Repeat APS labs pening.  #Disposition:  #  follow-up-in 3 months-MD /CBC CMP myeloma panel kappa lambda light chain- Dr.B  Cc; Dr.Hande       Cammie Sickle, MD 02/17/2019 6:13 PM

## 2019-02-16 NOTE — Assessment & Plan Note (Addendum)
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  Patient currently on surveillance.   #Clinically stable April 2020 M protein 0.2 g/dL; kappa lambda light chain ratio 2.9.  Await myeloma numbers from today.  #Anemia hemoglobin 11 likely from CKD less likely from active multiple myeloma.  Await myeloma numbers from  # chronic back pain-worsening.  Recent MRI lumbar negative for any active myeloma process.  Patient is followed by pain management.  Awaiting injections.  #Peripheral neuropathy-status post EMG question etiology.  Defer to neurology  # CKD stage III- IVcreatinine- 1,4-2.0 stable.  [Dr.Singh]   #Hypothyroidism-TSH 6.5.  Continue current dose of Synthroid.   #Antiphospholipid antibody syndrome-antibody positivity; IgG [facility] & June 2020]-  Continue asprin. Repeat APS labs pening.  #Disposition:  #  follow-up-in 3 months-MD /CBC CMP myeloma panel kappa lambda light chain- Dr.B  Cc; Dr.Hande

## 2019-02-17 LAB — ANTIPHOSPHOLIPID SYNDROME PROF
Anticardiolipin IgG: <9 GPL U/mL (ref 0–14)
Anticardiolipin IgM: <9 MPL U/mL (ref 0–12)
DRVVT: 30.9 s (ref 0.0–47.0)
PTT Lupus Anticoagulant: 28.9 s (ref 0.0–51.9)

## 2019-02-17 LAB — KAPPA/LAMBDA LIGHT CHAINS
Kappa free light chain: 36.9 mg/L — ABNORMAL HIGH (ref 3.3–19.4)
Kappa, lambda light chain ratio: 2.41 — ABNORMAL HIGH (ref 0.26–1.65)
Lambda free light chains: 15.3 mg/L (ref 5.7–26.3)

## 2019-02-20 ENCOUNTER — Telehealth: Payer: Self-pay | Admitting: Internal Medicine

## 2019-02-20 LAB — MULTIPLE MYELOMA PANEL, SERUM
Albumin SerPl Elph-Mcnc: 3.8 g/dL (ref 2.9–4.4)
Albumin/Glob SerPl: 1.6 (ref 0.7–1.7)
Alpha 1: 0.2 g/dL (ref 0.0–0.4)
Alpha2 Glob SerPl Elph-Mcnc: 0.9 g/dL (ref 0.4–1.0)
B-Globulin SerPl Elph-Mcnc: 0.8 g/dL (ref 0.7–1.3)
Gamma Glob SerPl Elph-Mcnc: 0.6 g/dL (ref 0.4–1.8)
Globulin, Total: 2.5 g/dL (ref 2.2–3.9)
IgA: 42 mg/dL — ABNORMAL LOW (ref 64–422)
IgG (Immunoglobin G), Serum: 699 mg/dL (ref 586–1602)
IgM (Immunoglobulin M), Srm: 12 mg/dL — ABNORMAL LOW (ref 26–217)
M Protein SerPl Elph-Mcnc: 0.3 g/dL — ABNORMAL HIGH
Total Protein ELP: 6.3 g/dL (ref 6.0–8.5)

## 2019-02-20 NOTE — Telephone Encounter (Signed)
Spoke to patient regarding the results of slightly rising M protein-0.3 from 0.2/stable hemoglobin.  Recommend continued monitoring at this time/follow-up as planned in 3 months.  Patient agreement.

## 2019-03-27 ENCOUNTER — Ambulatory Visit: Payer: Medicare Other | Admitting: Urology

## 2019-05-18 ENCOUNTER — Inpatient Hospital Stay: Payer: Medicare Other | Attending: Internal Medicine

## 2019-05-18 ENCOUNTER — Other Ambulatory Visit: Payer: Self-pay | Admitting: *Deleted

## 2019-05-18 ENCOUNTER — Inpatient Hospital Stay (HOSPITAL_BASED_OUTPATIENT_CLINIC_OR_DEPARTMENT_OTHER): Payer: Medicare Other | Admitting: Internal Medicine

## 2019-05-18 ENCOUNTER — Other Ambulatory Visit: Payer: Self-pay

## 2019-05-18 DIAGNOSIS — G629 Polyneuropathy, unspecified: Secondary | ICD-10-CM | POA: Insufficient documentation

## 2019-05-18 DIAGNOSIS — C9000 Multiple myeloma not having achieved remission: Secondary | ICD-10-CM

## 2019-05-18 DIAGNOSIS — N183 Chronic kidney disease, stage 3 unspecified: Secondary | ICD-10-CM | POA: Insufficient documentation

## 2019-05-18 DIAGNOSIS — D649 Anemia, unspecified: Secondary | ICD-10-CM | POA: Diagnosis not present

## 2019-05-18 DIAGNOSIS — Z79899 Other long term (current) drug therapy: Secondary | ICD-10-CM | POA: Insufficient documentation

## 2019-05-18 DIAGNOSIS — R5383 Other fatigue: Secondary | ICD-10-CM | POA: Diagnosis not present

## 2019-05-18 DIAGNOSIS — G8929 Other chronic pain: Secondary | ICD-10-CM | POA: Insufficient documentation

## 2019-05-18 DIAGNOSIS — C9001 Multiple myeloma in remission: Secondary | ICD-10-CM | POA: Diagnosis present

## 2019-05-18 DIAGNOSIS — M2550 Pain in unspecified joint: Secondary | ICD-10-CM | POA: Diagnosis not present

## 2019-05-18 DIAGNOSIS — M549 Dorsalgia, unspecified: Secondary | ICD-10-CM | POA: Insufficient documentation

## 2019-05-18 DIAGNOSIS — E039 Hypothyroidism, unspecified: Secondary | ICD-10-CM | POA: Diagnosis not present

## 2019-05-18 DIAGNOSIS — Z881 Allergy status to other antibiotic agents status: Secondary | ICD-10-CM | POA: Insufficient documentation

## 2019-05-18 DIAGNOSIS — M545 Low back pain: Secondary | ICD-10-CM | POA: Diagnosis not present

## 2019-05-18 DIAGNOSIS — M791 Myalgia, unspecified site: Secondary | ICD-10-CM | POA: Insufficient documentation

## 2019-05-18 LAB — CBC WITH DIFFERENTIAL/PLATELET
Abs Immature Granulocytes: 0.02 10*3/uL (ref 0.00–0.07)
Basophils Absolute: 0.1 10*3/uL (ref 0.0–0.1)
Basophils Relative: 1 %
Eosinophils Absolute: 0.3 10*3/uL (ref 0.0–0.5)
Eosinophils Relative: 4 %
HCT: 34.3 % — ABNORMAL LOW (ref 36.0–46.0)
Hemoglobin: 10.6 g/dL — ABNORMAL LOW (ref 12.0–15.0)
Immature Granulocytes: 0 %
Lymphocytes Relative: 19 %
Lymphs Abs: 1.2 10*3/uL (ref 0.7–4.0)
MCH: 30.9 pg (ref 26.0–34.0)
MCHC: 30.9 g/dL (ref 30.0–36.0)
MCV: 100 fL (ref 80.0–100.0)
Monocytes Absolute: 0.5 10*3/uL (ref 0.1–1.0)
Monocytes Relative: 8 %
Neutro Abs: 4.5 10*3/uL (ref 1.7–7.7)
Neutrophils Relative %: 68 %
Platelets: 165 10*3/uL (ref 150–400)
RBC: 3.43 MIL/uL — ABNORMAL LOW (ref 3.87–5.11)
RDW: 13.2 % (ref 11.5–15.5)
WBC: 6.5 10*3/uL (ref 4.0–10.5)
nRBC: 0 % (ref 0.0–0.2)

## 2019-05-18 LAB — COMPREHENSIVE METABOLIC PANEL
ALT: 10 U/L (ref 0–44)
AST: 25 U/L (ref 15–41)
Albumin: 4.1 g/dL (ref 3.5–5.0)
Alkaline Phosphatase: 48 U/L (ref 38–126)
Anion gap: 8 (ref 5–15)
BUN: 26 mg/dL — ABNORMAL HIGH (ref 8–23)
CO2: 19 mmol/L — ABNORMAL LOW (ref 22–32)
Calcium: 8.6 mg/dL — ABNORMAL LOW (ref 8.9–10.3)
Chloride: 110 mmol/L (ref 98–111)
Creatinine, Ser: 1.75 mg/dL — ABNORMAL HIGH (ref 0.44–1.00)
GFR calc Af Amer: 32 mL/min — ABNORMAL LOW (ref >60)
GFR calc non Af Amer: 27 mL/min — ABNORMAL LOW (ref >60)
Glucose, Bld: 103 mg/dL — ABNORMAL HIGH (ref 70–99)
Potassium: 4.2 mmol/L (ref 3.5–5.1)
Sodium: 137 mmol/L (ref 135–145)
Total Bilirubin: 0.4 mg/dL (ref 0.3–1.2)
Total Protein: 6.8 g/dL (ref 6.5–8.1)

## 2019-05-18 LAB — IRON AND TIBC
Iron: 83 ug/dL (ref 28–170)
Saturation Ratios: 32 % — ABNORMAL HIGH (ref 10.4–31.8)
TIBC: 262 ug/dL (ref 250–450)
UIBC: 179 ug/dL

## 2019-05-18 LAB — FERRITIN: Ferritin: 161 ng/mL (ref 11–307)

## 2019-05-18 NOTE — Assessment & Plan Note (Addendum)
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  Patient currently on surveillance.   #Clinically stable ; october 2020 M protein 0.3 g/dL; kappa lambda light chain ratio 2.4.  Await myeloma numbers from today.  #Anemia hemoglobin 10.4-likely from underlying renal insufficiency/iron deficiency rather than myeloma.  Recommend Vitron-C.  # chronic back pain/MRI 2020 - unrelated to myeloma.  Defer to pain management for injections.  #Peripheral neuropathy-status post EMG question etiology.STABLE. Defer to neurology  # CKD stage III- IVcreatinine- 1,4-2.0 stable.  [Dr.Singh]   #Hypothyroidism-TSH 6.5.  Continue current dose of Synthroid.   # I discussed regarding Covid precautions/and also discussed proceeding with Covid vaccination when available.  Discussed that unfortunately the data safety and efficacy of vaccination is unclear especially in patients with immunocompromised state.  However, I think the benefits of the vaccination outweigh the potential risks.  #Disposition:  #  follow-up-in 3 months-VIDEO-MD;labs- 1 week prior-CBC CMP myeloma panel kappa lambda light chain- Dr.B  Cc; Dr.Hande

## 2019-05-18 NOTE — Progress Notes (Signed)
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 benign   Multiple myeloma not having achieved remission ([place])  12/18/2015 Initial Diagnosis   Multiple myeloma in remission [facility])          INTERVAL HISTORY: This is a pleasant 79 year-old female patient with above history of multiple Myeloma [status post autologous stem cell transplant May 2013 patient denies any] currently on surveillance/no maintenance therapy- Is here for follow-up.  Patient continues to have chronic low back pain.  Not any worse.  Chronic mild tingling and numbness in the extremities.  Patient has not had any further infection issues with Covid.  Mild to moderate fatigue.    Review of Systems  Constitutional: Positive for malaise/fatigue. Negative for diaphoresis, fever and weight loss.       Cold intolerance.  HENT: Negative for nosebleeds and sore throat.   Eyes: Negative for double vision.  Respiratory: Negative for cough, hemoptysis, sputum production, shortness of breath and wheezing.   Cardiovascular: Negative for chest pain, palpitations, orthopnea and leg swelling.  Gastrointestinal: Negative for abdominal pain, blood in stool, constipation, diarrhea, heartburn, melena, nausea and vomiting.  Genitourinary: Negative for dysuria, frequency and urgency.  Musculoskeletal: Positive for back pain, joint pain and myalgias.  Skin: Negative.  Negative for itching and rash.  Neurological: Positive for tingling. Negative for dizziness, focal weakness, weakness and headaches.   Endo/Heme/Allergies: Does not bruise/bleed easily.  Psychiatric/Behavioral: Negative for depression. The patient is not nervous/anxious and does not have insomnia.     ALLERGIES:  is allergic to isoniazid and keflex [cephalexin].  MEDICATIONS:  Current Outpatient Medications  Medication Sig Dispense Refill  . acetaminophen (TYLENOL) 325 MG tablet Take 650 mg by mouth every 6 (six) hours as needed for fever or headache.    . allopurinol (ZYLOPRIM) 300 MG tablet Take 300 mg by mouth daily.     Marland Kitchen ALPRAZolam (XANAX) 0.25 MG tablet Take 0.25 mg by mouth at bedtime.   0  . atenolol (TENORMIN) 50 MG tablet Take 0.5 tablets (25 mg total) by mouth daily. 30 tablet 0  . cetirizine (ZYRTEC) 10 MG tablet Take 10 mg by mouth 2 (two) times daily.     Marland Kitchen escitalopram (LEXAPRO) 10 MG tablet Take 10 mg by mouth daily.    . fluticasone (FLONASE) 50 MCG/ACT nasal spray Place 2 sprays into the nose daily.     . hydroxychloroquine (PLAQUENIL) 200 MG tablet TAKE 2 TABLETS BY MOUTH EVERY DAY    . lansoprazole (PREVACID) 30 MG capsule Take 30 mg by mouth daily at 12 noon.    . leflunomide (ARAVA) 10 MG tablet Take 1 tablet (10 mg total) by mouth daily.  1  . levothyroxine (SYNTHROID, LEVOTHROID) 75 MCG tablet Take 75 mcg by mouth daily before breakfast.   5  . oxybutynin (DITROPAN-XL) 10 MG 24 hr tablet Take 1 tablet (10 mg total) by mouth daily. 30 tablet 11  . triamterene-hydrochlorothiazide (MAXZIDE-25) 37.5-25 MG tablet TK 1 T PO PRN     No current facility-administered medications for this visit.  PHYSICAL EXAMINATION: ECOG PERFORMANCE STATUS: 0 - Asymptomatic  BP 139/71 (Patient Position: Sitting)   Pulse 72   Temp 98.2 F (36.8 C) (Tympanic)   Resp 20   Ht '5\' 7"'  (1.702 m)   Wt 164 lb (74.4 kg)   BMI 25.69 kg/m   Filed Weights   05/18/19 1112  Weight: 164 lb (74.4 kg)    Physical Exam  Constitutional: She is oriented to person, place, and time and well-developed, well-nourished, and in  no distress.  HENT:  Head: Normocephalic and atraumatic.  Mouth/Throat: Oropharynx is clear and moist. No oropharyngeal exudate.  Eyes: Pupils are equal, round, and reactive to light.  Cardiovascular: Normal rate and regular rhythm.  Pulmonary/Chest: No respiratory distress. She has no wheezes.  Abdominal: Soft. Bowel sounds are normal. She exhibits no distension and no mass. There is no abdominal tenderness. There is no rebound and no guarding.  Musculoskeletal:        General: No tenderness or edema. Normal range of motion.     Cervical back: Normal range of motion and neck supple.  Neurological: She is alert and oriented to person, place, and time.  Skin: Skin is warm.  Psychiatric: Affect normal.    LABORATORY DATA:  I have reviewed the data as listed    Component Value Date/Time   NA 137 05/18/2019 1053   NA 142 08/16/2013 1028   K 4.2 05/18/2019 1053   K 4.5 08/16/2013 1028   CL 110 05/18/2019 1053   CL 106 08/16/2013 1028   CO2 19 (L) 05/18/2019 1053   CO2 27 08/16/2013 1028   GLUCOSE 103 (H) 05/18/2019 1053   GLUCOSE 99 08/16/2013 1028   BUN 26 (H) 05/18/2019 1053   BUN 21 (H) 08/16/2013 1028   CREATININE 1.75 (H) 05/18/2019 1053   CREATININE 1.42 (H) 09/07/2014 0954   CALCIUM 8.6 (L) 05/18/2019 1053   CALCIUM 9.3 09/07/2014 0954   PROT 6.8 05/18/2019 1053   PROT 7.4 08/16/2013 1028   ALBUMIN 4.1 05/18/2019 1053   ALBUMIN 3.9 08/16/2013 1028   AST 25 05/18/2019 1053   AST 27 08/16/2013 1028   ALT 10 05/18/2019 1053   ALT 15 08/16/2013 1028   ALKPHOS 48 05/18/2019 1053   ALKPHOS 61 08/16/2013 1028   BILITOT 0.4 05/18/2019 1053   BILITOT 0.3 08/16/2013 1028   GFRNONAA 27 (L) 05/18/2019 1053   GFRNONAA 36 (L) 09/07/2014 0954   GFRAA 32 (L) 05/18/2019 1053   GFRAA 42 (L) 09/07/2014 0954    No results found for: SPEP, UPEP  Lab Results  Component Value Date   WBC 6.5 05/18/2019   NEUTROABS 4.5 05/18/2019   HGB 10.6 (L) 05/18/2019   HCT 34.3 (L)  05/18/2019   MCV 100.0 05/18/2019   PLT 165 05/18/2019      Chemistry      Component Value Date/Time   NA 137 05/18/2019 1053   NA 142 08/16/2013 1028   K 4.2 05/18/2019 1053   K 4.5 08/16/2013 1028   CL 110 05/18/2019 1053   CL 106 08/16/2013 1028   CO2 19 (L) 05/18/2019 1053   CO2 27 08/16/2013 1028   BUN 26 (H) 05/18/2019 1053   BUN 21 (H) 08/16/2013 1028   CREATININE 1.75 (H) 05/18/2019 1053   CREATININE 1.42 (H) 09/07/2014 0954      Component Value Date/Time   CALCIUM 8.6 (L) 05/18/2019 1053   CALCIUM 9.3 09/07/2014 0954   ALKPHOS 48 05/18/2019 1053   ALKPHOS 61  08/16/2013 1028   AST 25 05/18/2019 1053   AST 27 08/16/2013 1028   ALT 10 05/18/2019 1053   ALT 15 08/16/2013 1028   BILITOT 0.4 05/18/2019 1053   BILITOT 0.3 08/16/2013 1028       RADIOGRAPHIC STUDIES: I have personally reviewed the radiological images as listed and agreed with the findings in the report. No results found.   ASSESSMENT & PLAN:    Multiple myeloma not having achieved remission [facility]) # Multiple myeloma-Dx: 2012 s/p autologous stem cell transplant in May 2013.  Patient currently on surveillance.   #Clinically stable ; october 2020 M protein 0.3 g/dL; kappa lambda light chain ratio 2.4.  Await myeloma numbers from today.  #Anemia hemoglobin 10.4-likely from underlying renal insufficiency/iron deficiency rather than myeloma.  Recommend Vitron-C.  # chronic back pain/MRI 2020 - unrelated to myeloma.  Defer to pain management for injections.  #Peripheral neuropathy-status post EMG question etiology.STABLE. Defer to neurology  # CKD stage III- IVcreatinine- 1,4-2.0 stable.  [Dr.Singh]   #Hypothyroidism-TSH 6.5.  Continue current dose of Synthroid.   # I discussed regarding Covid precautions/and also discussed proceeding with Covid vaccination when available.  Discussed that unfortunately the data safety and efficacy of vaccination is unclear especially in patients with  immunocompromised state.  However, I think the benefits of the vaccination outweigh the potential risks.  #Disposition:  #  follow-up-in 3 months-VIDEO-MD;labs- 1 week prior-CBC CMP myeloma panel kappa lambda light chain- Dr.B  Cc; Dr.Hande       Cammie Sickle, MD 05/19/2019 9:50 AM

## 2019-05-18 NOTE — Patient Instructions (Signed)
#   Vitron C [Over the counter] -iron pill once a day.

## 2019-05-19 LAB — KAPPA/LAMBDA LIGHT CHAINS
Kappa free light chain: 41.2 mg/L — ABNORMAL HIGH (ref 3.3–19.4)
Kappa, lambda light chain ratio: 2.47 — ABNORMAL HIGH (ref 0.26–1.65)
Lambda free light chains: 16.7 mg/L (ref 5.7–26.3)

## 2019-05-22 LAB — MULTIPLE MYELOMA PANEL, SERUM
Albumin SerPl Elph-Mcnc: 3.5 g/dL (ref 2.9–4.4)
Albumin/Glob SerPl: 1.3 (ref 0.7–1.7)
Alpha 1: 0.3 g/dL (ref 0.0–0.4)
Alpha2 Glob SerPl Elph-Mcnc: 0.9 g/dL (ref 0.4–1.0)
B-Globulin SerPl Elph-Mcnc: 0.9 g/dL (ref 0.7–1.3)
Gamma Glob SerPl Elph-Mcnc: 0.6 g/dL (ref 0.4–1.8)
Globulin, Total: 2.7 g/dL (ref 2.2–3.9)
IgA: 43 mg/dL — ABNORMAL LOW (ref 64–422)
IgG (Immunoglobin G), Serum: 690 mg/dL (ref 586–1602)
IgM (Immunoglobulin M), Srm: 12 mg/dL — ABNORMAL LOW (ref 26–217)
M Protein SerPl Elph-Mcnc: 0.2 g/dL — ABNORMAL HIGH
Total Protein ELP: 6.2 g/dL (ref 6.0–8.5)

## 2019-07-15 ENCOUNTER — Other Ambulatory Visit: Payer: Self-pay | Admitting: Nurse Practitioner

## 2019-08-09 ENCOUNTER — Other Ambulatory Visit: Payer: Self-pay | Admitting: *Deleted

## 2019-08-09 DIAGNOSIS — C9000 Multiple myeloma not having achieved remission: Secondary | ICD-10-CM

## 2019-08-10 ENCOUNTER — Other Ambulatory Visit: Payer: Self-pay

## 2019-08-10 ENCOUNTER — Inpatient Hospital Stay: Payer: Medicare Other | Attending: Internal Medicine

## 2019-08-10 DIAGNOSIS — C9000 Multiple myeloma not having achieved remission: Secondary | ICD-10-CM | POA: Diagnosis present

## 2019-08-10 LAB — COMPREHENSIVE METABOLIC PANEL
ALT: 12 U/L (ref 0–44)
AST: 26 U/L (ref 15–41)
Albumin: 4.3 g/dL (ref 3.5–5.0)
Alkaline Phosphatase: 52 U/L (ref 38–126)
Anion gap: 9 (ref 5–15)
BUN: 37 mg/dL — ABNORMAL HIGH (ref 8–23)
CO2: 21 mmol/L — ABNORMAL LOW (ref 22–32)
Calcium: 9.6 mg/dL (ref 8.9–10.3)
Chloride: 109 mmol/L (ref 98–111)
Creatinine, Ser: 1.88 mg/dL — ABNORMAL HIGH (ref 0.44–1.00)
GFR calc Af Amer: 29 mL/min — ABNORMAL LOW (ref >60)
GFR calc non Af Amer: 25 mL/min — ABNORMAL LOW (ref >60)
Glucose, Bld: 97 mg/dL (ref 70–99)
Potassium: 4.2 mmol/L (ref 3.5–5.1)
Sodium: 139 mmol/L (ref 135–145)
Total Bilirubin: 0.5 mg/dL (ref 0.3–1.2)
Total Protein: 7.2 g/dL (ref 6.5–8.1)

## 2019-08-10 LAB — CBC WITH DIFFERENTIAL/PLATELET
Abs Immature Granulocytes: 0.05 10*3/uL (ref 0.00–0.07)
Basophils Absolute: 0.1 10*3/uL (ref 0.0–0.1)
Basophils Relative: 1 %
Eosinophils Absolute: 0.3 10*3/uL (ref 0.0–0.5)
Eosinophils Relative: 3 %
HCT: 34.2 % — ABNORMAL LOW (ref 36.0–46.0)
Hemoglobin: 10.9 g/dL — ABNORMAL LOW (ref 12.0–15.0)
Immature Granulocytes: 1 %
Lymphocytes Relative: 16 %
Lymphs Abs: 1.3 10*3/uL (ref 0.7–4.0)
MCH: 31 pg (ref 26.0–34.0)
MCHC: 31.9 g/dL (ref 30.0–36.0)
MCV: 97.2 fL (ref 80.0–100.0)
Monocytes Absolute: 0.5 10*3/uL (ref 0.1–1.0)
Monocytes Relative: 7 %
Neutro Abs: 5.9 10*3/uL (ref 1.7–7.7)
Neutrophils Relative %: 72 %
Platelets: 162 10*3/uL (ref 150–400)
RBC: 3.52 MIL/uL — ABNORMAL LOW (ref 3.87–5.11)
RDW: 13.7 % (ref 11.5–15.5)
WBC: 8.2 10*3/uL (ref 4.0–10.5)
nRBC: 0 % (ref 0.0–0.2)

## 2019-08-11 LAB — KAPPA/LAMBDA LIGHT CHAINS
Kappa free light chain: 46.3 mg/L — ABNORMAL HIGH (ref 3.3–19.4)
Kappa, lambda light chain ratio: 2.57 — ABNORMAL HIGH (ref 0.26–1.65)
Lambda free light chains: 18 mg/L (ref 5.7–26.3)

## 2019-08-14 LAB — MULTIPLE MYELOMA PANEL, SERUM
Albumin SerPl Elph-Mcnc: 4.2 g/dL (ref 2.9–4.4)
Albumin/Glob SerPl: 1.5 (ref 0.7–1.7)
Alpha 1: 0.3 g/dL (ref 0.0–0.4)
Alpha2 Glob SerPl Elph-Mcnc: 0.9 g/dL (ref 0.4–1.0)
B-Globulin SerPl Elph-Mcnc: 1 g/dL (ref 0.7–1.3)
Gamma Glob SerPl Elph-Mcnc: 0.7 g/dL (ref 0.4–1.8)
Globulin, Total: 2.9 g/dL (ref 2.2–3.9)
IgA: 44 mg/dL — ABNORMAL LOW (ref 64–422)
IgG (Immunoglobin G), Serum: 719 mg/dL (ref 586–1602)
IgM (Immunoglobulin M), Srm: 14 mg/dL — ABNORMAL LOW (ref 26–217)
M Protein SerPl Elph-Mcnc: 0.3 g/dL — ABNORMAL HIGH
Total Protein ELP: 7.1 g/dL (ref 6.0–8.5)

## 2019-08-16 ENCOUNTER — Encounter: Payer: Self-pay | Admitting: Licensed Clinical Social Worker

## 2019-08-17 ENCOUNTER — Inpatient Hospital Stay (HOSPITAL_BASED_OUTPATIENT_CLINIC_OR_DEPARTMENT_OTHER): Payer: Medicare Other | Admitting: Internal Medicine

## 2019-08-17 DIAGNOSIS — C9000 Multiple myeloma not having achieved remission: Secondary | ICD-10-CM | POA: Diagnosis not present

## 2019-08-17 NOTE — Progress Notes (Signed)
I connected with Sydney Gillespie on 08/17/19 at 10:30 AM EDT by video enabled telemedicine visit and verified that I am speaking with the correct person using two identifiers.  I discussed the limitations, risks, security and privacy concerns of performing an evaluation and management service by telemedicine and the availability of in-person appointments. I also discussed with the patient that there may be a patient responsible charge related to this service. The patient expressed understanding and agreed to proceed.    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 benign   Multiple myeloma not having achieved remission ([place])  12/18/2015 Initial Diagnosis   Multiple myeloma in remission [facility])      Chief Complaint: Multiple myeloma   History of present illness:Sydney Gillespie 79 y.o.  female with history of multiple myeloma currently not on maintenance/under surveillance is here for follow-up.  Patient states to have noted worsening bilateral arthritis hand joints.  She is currently being followed by Dr. Jefm Bryant.  She is recommended surgery as conservative measures are not helping.  Patient is on Plaquenil.  Patient denies any recent hospitalizations.  Patient had a Covid vaccination-noted to have flulike symptoms resolved.   Observation/objective: M protein 0.3; kappa lambda light chain ratio 2.5; hemoglobin 11.5; creatinine 1.8.  Assessment and plan: Multiple myeloma not having achieved remission [facility]) # Multiple myeloma-Dx: 2012 s/p autologous stem  cell transplant in May 2013.  Patient currently on surveillance.  Clinically stable.    #April 2021 M protein 0.3 g/dL; kappa lambda light chain ratio 2.5; overall clinically stable.  No concerns for progression at this time.  Recommend close monitoring.  #Anemia hemoglobin 10.9 likely from underlying renal insufficiency/iron deficiency rather than myeloma.  Continue iron supplementation.  #Peripheral neuropathy-status post EMG question etiology.STABLE. Defer to neurology  # CKD stage III- IVcreatinine-creatinine 1.8 overall stable..  [Dr.Singh]   # RA- [on Plaquenil]- ok to proceed with surgery from hematology standpoint.   #Disposition:  #  follow-up-in 3 months-MD;labs- 1 week prior-CBC CMP myeloma panel kappa lambda light chain- Dr.B  Cc; Dr.Hande/ Dr.[facility]le    Follow-up instructions:  I discussed the assessment and treatment plan with the patient.  The patient was provided an opportunity to ask questions and all were answered.  The patient agreed with the plan and demonstrated understanding of instructions.  The patient was advised to call back or seek an in person evaluation if the symptoms worsen or if the condition fails to improve as anticipated.  Dr. Charlaine Dalton Sutherland at [facility] 08/17/2019 12:13 PM

## 2019-08-17 NOTE — Assessment & Plan Note (Signed)
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  Patient currently on surveillance.  Clinically stable.    #April 2021 M protein 0.3 g/dL; kappa lambda light chain ratio 2.5; overall clinically stable.  No concerns for progression at this time.  Recommend close monitoring.  #Anemia hemoglobin 10.9 likely from underlying renal insufficiency/iron deficiency rather than myeloma.  Continue iron supplementation.  #Peripheral neuropathy-status post EMG question etiology.STABLE. Defer to neurology  # CKD stage III- IVcreatinine-creatinine 1.8 overall stable..  [Dr.Singh]   # RA- [on Plaquenil]- ok to proceed with surgery from hematology standpoint.   #Disposition:  #  follow-up-in 3 months-MD;labs- 1 week prior-CBC CMP myeloma panel kappa lambda light chain- Dr.B  Cc; Dr.Hande/ Dr.Kern[facility]le

## 2019-10-11 DIAGNOSIS — G629 Polyneuropathy, unspecified: Secondary | ICD-10-CM | POA: Insufficient documentation

## 2019-10-11 DIAGNOSIS — G2581 Restless legs syndrome: Secondary | ICD-10-CM | POA: Insufficient documentation

## 2019-11-09 ENCOUNTER — Inpatient Hospital Stay: Payer: Medicare Other | Attending: Internal Medicine

## 2019-11-09 ENCOUNTER — Other Ambulatory Visit: Payer: Self-pay

## 2019-11-09 DIAGNOSIS — C9001 Multiple myeloma in remission: Secondary | ICD-10-CM | POA: Insufficient documentation

## 2019-11-09 DIAGNOSIS — C9000 Multiple myeloma not having achieved remission: Secondary | ICD-10-CM

## 2019-11-09 LAB — COMPREHENSIVE METABOLIC PANEL
ALT: 10 U/L (ref 0–44)
AST: 22 U/L (ref 15–41)
Albumin: 4 g/dL (ref 3.5–5.0)
Alkaline Phosphatase: 66 U/L (ref 38–126)
Anion gap: 10 (ref 5–15)
BUN: 31 mg/dL — ABNORMAL HIGH (ref 8–23)
CO2: 22 mmol/L (ref 22–32)
Calcium: 9.3 mg/dL (ref 8.9–10.3)
Chloride: 105 mmol/L (ref 98–111)
Creatinine, Ser: 2 mg/dL — ABNORMAL HIGH (ref 0.44–1.00)
GFR calc Af Amer: 27 mL/min — ABNORMAL LOW (ref >60)
GFR calc non Af Amer: 23 mL/min — ABNORMAL LOW (ref >60)
Glucose, Bld: 123 mg/dL — ABNORMAL HIGH (ref 70–99)
Potassium: 4 mmol/L (ref 3.5–5.1)
Sodium: 137 mmol/L (ref 135–145)
Total Bilirubin: 0.5 mg/dL (ref 0.3–1.2)
Total Protein: 7.3 g/dL (ref 6.5–8.1)

## 2019-11-09 LAB — CBC WITH DIFFERENTIAL/PLATELET
Abs Immature Granulocytes: 0.04 10*3/uL (ref 0.00–0.07)
Basophils Absolute: 0.1 10*3/uL (ref 0.0–0.1)
Basophils Relative: 1 %
Eosinophils Absolute: 0.3 10*3/uL (ref 0.0–0.5)
Eosinophils Relative: 4 %
HCT: 33.1 % — ABNORMAL LOW (ref 36.0–46.0)
Hemoglobin: 10.8 g/dL — ABNORMAL LOW (ref 12.0–15.0)
Immature Granulocytes: 1 %
Lymphocytes Relative: 20 %
Lymphs Abs: 1.6 10*3/uL (ref 0.7–4.0)
MCH: 32 pg (ref 26.0–34.0)
MCHC: 32.6 g/dL (ref 30.0–36.0)
MCV: 98.2 fL (ref 80.0–100.0)
Monocytes Absolute: 0.7 10*3/uL (ref 0.1–1.0)
Monocytes Relative: 9 %
Neutro Abs: 5.3 10*3/uL (ref 1.7–7.7)
Neutrophils Relative %: 65 %
Platelets: 171 10*3/uL (ref 150–400)
RBC: 3.37 MIL/uL — ABNORMAL LOW (ref 3.87–5.11)
RDW: 13.6 % (ref 11.5–15.5)
WBC: 8 10*3/uL (ref 4.0–10.5)
nRBC: 0 % (ref 0.0–0.2)

## 2019-11-10 LAB — KAPPA/LAMBDA LIGHT CHAINS
Kappa free light chain: 61.7 mg/L — ABNORMAL HIGH (ref 3.3–19.4)
Kappa, lambda light chain ratio: 3.41 — ABNORMAL HIGH (ref 0.26–1.65)
Lambda free light chains: 18.1 mg/L (ref 5.7–26.3)

## 2019-11-11 LAB — MULTIPLE MYELOMA PANEL, SERUM
Albumin SerPl Elph-Mcnc: 3.8 g/dL (ref 2.9–4.4)
Albumin/Glob SerPl: 1.5 (ref 0.7–1.7)
Alpha 1: 0.2 g/dL (ref 0.0–0.4)
Alpha2 Glob SerPl Elph-Mcnc: 0.9 g/dL (ref 0.4–1.0)
B-Globulin SerPl Elph-Mcnc: 0.9 g/dL (ref 0.7–1.3)
Gamma Glob SerPl Elph-Mcnc: 0.6 g/dL (ref 0.4–1.8)
Globulin, Total: 2.6 g/dL (ref 2.2–3.9)
IgA: 56 mg/dL — ABNORMAL LOW (ref 64–422)
IgG (Immunoglobin G), Serum: 738 mg/dL (ref 586–1602)
IgM (Immunoglobulin M), Srm: 16 mg/dL — ABNORMAL LOW (ref 26–217)
M Protein SerPl Elph-Mcnc: 0.3 g/dL — ABNORMAL HIGH
Total Protein ELP: 6.4 g/dL (ref 6.0–8.5)

## 2019-11-16 ENCOUNTER — Inpatient Hospital Stay (HOSPITAL_BASED_OUTPATIENT_CLINIC_OR_DEPARTMENT_OTHER): Payer: Medicare Other | Admitting: Internal Medicine

## 2019-11-16 ENCOUNTER — Other Ambulatory Visit: Payer: Self-pay | Admitting: Internal Medicine

## 2019-11-16 DIAGNOSIS — C9000 Multiple myeloma not having achieved remission: Secondary | ICD-10-CM

## 2019-11-16 DIAGNOSIS — Z1231 Encounter for screening mammogram for malignant neoplasm of breast: Secondary | ICD-10-CM

## 2019-11-16 NOTE — Progress Notes (Signed)
I connected with Sydney Gillespie on 11/16/19 at 11:00 AM EDT by video enabled telemedicine visit and verified that I am speaking with the correct person using two identifiers.  I discussed the limitations, risks, security and privacy concerns of performing an evaluation and management service by telemedicine and the availability of in-person appointments. I also discussed with the patient that there may be a patient responsible charge related to this service. The patient expressed understanding and agreed to proceed.    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 benign   Multiple myeloma not having achieved remission ([place])  12/18/2015 Initial Diagnosis   Multiple myeloma in remission [facility])      Chief Complaint: Multiple myeloma/anemia CKD   History of present illness:Sydney Gillespie 79 y.o.  female with history of multiple myeloma status post autologous and cell transplant currently on surveillance is here for follow-up.  Patient continues to complain of ongoing fatigue.  Denies any blood in stools or black or stools.  Continues have joint pains chronic.  Not any worse.  No nausea no vomiting abdominal pain.  Observation/objective: See below.  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  Currently under surveillance clinically stable.  #July 2021 M protein 0.3 g/dL; kappa lambda light chain ratio 2.6; overall clinically stable.  No concerns from  progression at this time recommend close monitoring every 3 months.  #Anemia hemoglobin 10.6-clinically stable likely secondary to underlying CKD rather than progressive myeloma. Patient on p.o. iron.   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  [Dr.Singh]   # RA- [on Plaquenil]-stable.Marland Kitchen   #Disposition:  #  follow-up-in 3 months-MD;labs- 1 week prior-CBC CMP myeloma panel kappa lambda light chain; Iron studies/ferritin- Dr.B  Cc; Dr.Hande/ Dr.Kernodle    Follow-up instructions:  I discussed the assessment and treatment plan with the patient.  The patient was provided an opportunity to ask questions and all were answered.  The patient agreed with the plan and demonstrated understanding of instructions.  The patient was advised to call back or seek an in person evaluation if the symptoms worsen or if the condition fails to improve as anticipated.   Dr. Charlaine Dalton [facility] at [facility] 11/16/2019 12:09 PM

## 2019-11-16 NOTE — Assessment & Plan Note (Addendum)
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  Currently under surveillance clinically stable.  #July 2021 M protein 0.3 g/dL; kappa lambda light chain ratio 2.6; overall clinically stable.  No concerns from progression at this time recommend close monitoring every 3 months.  #Anemia hemoglobin 10.6-clinically stable likely secondary to underlying CKD rather than progressive myeloma. Patient on p.o. iron.   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  [Dr.Singh]   # RA- [on Plaquenil]-stable.Marland Kitchen   #Disposition:  #  follow-up-in 3 months-MD;labs- 1 week prior-CBC CMP myeloma panel kappa lambda light chain; Iron studies/ferritin- Dr.B  Cc; Dr.Hande/ Dr.Kernodle

## 2020-01-10 ENCOUNTER — Other Ambulatory Visit: Payer: Self-pay

## 2020-01-10 ENCOUNTER — Ambulatory Visit
Admission: RE | Admit: 2020-01-10 | Discharge: 2020-01-10 | Disposition: A | Discharge status: Home / Self Care | Payer: Medicare Other | Source: Ambulatory Visit | Attending: Internal Medicine | Admitting: Internal Medicine

## 2020-01-10 DIAGNOSIS — Z1231 Encounter for screening mammogram for malignant neoplasm of breast: Secondary | ICD-10-CM | POA: Insufficient documentation

## 2020-01-29 DIAGNOSIS — N2581 Secondary hyperparathyroidism of renal origin: Secondary | ICD-10-CM | POA: Insufficient documentation

## 2020-02-08 ENCOUNTER — Inpatient Hospital Stay: Payer: Medicare Other

## 2020-02-09 ENCOUNTER — Inpatient Hospital Stay: Payer: Medicare Other | Attending: Internal Medicine

## 2020-02-09 ENCOUNTER — Other Ambulatory Visit: Payer: Self-pay

## 2020-02-09 DIAGNOSIS — M549 Dorsalgia, unspecified: Secondary | ICD-10-CM | POA: Insufficient documentation

## 2020-02-09 DIAGNOSIS — G8929 Other chronic pain: Secondary | ICD-10-CM | POA: Insufficient documentation

## 2020-02-09 DIAGNOSIS — Z881 Allergy status to other antibiotic agents status: Secondary | ICD-10-CM | POA: Diagnosis not present

## 2020-02-09 DIAGNOSIS — Z79899 Other long term (current) drug therapy: Secondary | ICD-10-CM | POA: Insufficient documentation

## 2020-02-09 DIAGNOSIS — C9001 Multiple myeloma in remission: Secondary | ICD-10-CM | POA: Diagnosis present

## 2020-02-09 DIAGNOSIS — C9000 Multiple myeloma not having achieved remission: Secondary | ICD-10-CM

## 2020-02-09 DIAGNOSIS — R5382 Chronic fatigue, unspecified: Secondary | ICD-10-CM | POA: Diagnosis not present

## 2020-02-09 DIAGNOSIS — M069 Rheumatoid arthritis, unspecified: Secondary | ICD-10-CM | POA: Diagnosis not present

## 2020-02-09 DIAGNOSIS — R202 Paresthesia of skin: Secondary | ICD-10-CM | POA: Diagnosis not present

## 2020-02-09 DIAGNOSIS — N184 Chronic kidney disease, stage 4 (severe): Secondary | ICD-10-CM | POA: Insufficient documentation

## 2020-02-09 DIAGNOSIS — R200 Anesthesia of skin: Secondary | ICD-10-CM | POA: Diagnosis not present

## 2020-02-09 LAB — CBC WITH DIFFERENTIAL/PLATELET
Abs Immature Granulocytes: 0.05 10*3/uL (ref 0.00–0.07)
Basophils Absolute: 0.1 10*3/uL (ref 0.0–0.1)
Basophils Relative: 1 %
Eosinophils Absolute: 0.3 10*3/uL (ref 0.0–0.5)
Eosinophils Relative: 4 %
HCT: 34.7 % — ABNORMAL LOW (ref 36.0–46.0)
Hemoglobin: 11.7 g/dL — ABNORMAL LOW (ref 12.0–15.0)
Immature Granulocytes: 1 %
Lymphocytes Relative: 19 %
Lymphs Abs: 1.5 10*3/uL (ref 0.7–4.0)
MCH: 32.1 pg (ref 26.0–34.0)
MCHC: 33.7 g/dL (ref 30.0–36.0)
MCV: 95.3 fL (ref 80.0–100.0)
Monocytes Absolute: 0.7 10*3/uL (ref 0.1–1.0)
Monocytes Relative: 8 %
Neutro Abs: 5.3 10*3/uL (ref 1.7–7.7)
Neutrophils Relative %: 67 %
Platelets: 210 10*3/uL (ref 150–400)
RBC: 3.64 MIL/uL — ABNORMAL LOW (ref 3.87–5.11)
RDW: 12.9 % (ref 11.5–15.5)
WBC: 7.9 10*3/uL (ref 4.0–10.5)
nRBC: 0 % (ref 0.0–0.2)

## 2020-02-09 LAB — IRON AND TIBC
Iron: 106 ug/dL (ref 28–170)
Saturation Ratios: 37 % — ABNORMAL HIGH (ref 10.4–31.8)
TIBC: 286 ug/dL (ref 250–450)
UIBC: 180 ug/dL

## 2020-02-09 LAB — COMPREHENSIVE METABOLIC PANEL
ALT: 11 U/L (ref 0–44)
AST: 26 U/L (ref 15–41)
Albumin: 4.4 g/dL (ref 3.5–5.0)
Alkaline Phosphatase: 60 U/L (ref 38–126)
Anion gap: 12 (ref 5–15)
BUN: 46 mg/dL — ABNORMAL HIGH (ref 8–23)
CO2: 23 mmol/L (ref 22–32)
Calcium: 10 mg/dL (ref 8.9–10.3)
Chloride: 101 mmol/L (ref 98–111)
Creatinine, Ser: 2.68 mg/dL — ABNORMAL HIGH (ref 0.44–1.00)
GFR calc Af Amer: 19 mL/min — ABNORMAL LOW (ref >60)
GFR calc non Af Amer: 16 mL/min — ABNORMAL LOW (ref >60)
Glucose, Bld: 102 mg/dL — ABNORMAL HIGH (ref 70–99)
Potassium: 4.4 mmol/L (ref 3.5–5.1)
Sodium: 136 mmol/L (ref 135–145)
Total Bilirubin: 0.7 mg/dL (ref 0.3–1.2)
Total Protein: 7.3 g/dL (ref 6.5–8.1)

## 2020-02-09 LAB — FERRITIN: Ferritin: 207 ng/mL (ref 11–307)

## 2020-02-12 LAB — KAPPA/LAMBDA LIGHT CHAINS
Kappa free light chain: 119.3 mg/L — ABNORMAL HIGH (ref 3.3–19.4)
Kappa, lambda light chain ratio: 5.03 — ABNORMAL HIGH (ref 0.26–1.65)
Lambda free light chains: 23.7 mg/L (ref 5.7–26.3)

## 2020-02-12 LAB — MULTIPLE MYELOMA PANEL, SERUM
Albumin SerPl Elph-Mcnc: 3.9 g/dL (ref 2.9–4.4)
Albumin/Glob SerPl: 1.4 (ref 0.7–1.7)
Alpha 1: 0.3 g/dL (ref 0.0–0.4)
Alpha2 Glob SerPl Elph-Mcnc: 1 g/dL (ref 0.4–1.0)
B-Globulin SerPl Elph-Mcnc: 1 g/dL (ref 0.7–1.3)
Gamma Glob SerPl Elph-Mcnc: 0.7 g/dL (ref 0.4–1.8)
Globulin, Total: 2.9 g/dL (ref 2.2–3.9)
IgA: 49 mg/dL — ABNORMAL LOW (ref 64–422)
IgG (Immunoglobin G), Serum: 718 mg/dL (ref 586–1602)
IgM (Immunoglobulin M), Srm: 19 mg/dL — ABNORMAL LOW (ref 26–217)
M Protein SerPl Elph-Mcnc: 0.3 g/dL — ABNORMAL HIGH
Total Protein ELP: 6.8 g/dL (ref 6.0–8.5)

## 2020-02-14 ENCOUNTER — Other Ambulatory Visit: Payer: Self-pay

## 2020-02-14 DIAGNOSIS — C9000 Multiple myeloma not having achieved remission: Secondary | ICD-10-CM

## 2020-02-15 ENCOUNTER — Other Ambulatory Visit: Payer: Self-pay

## 2020-02-15 ENCOUNTER — Ambulatory Visit
Admission: RE | Admit: 2020-02-15 | Discharge: 2020-02-15 | Disposition: A | Discharge status: Home / Self Care | Payer: Medicare Other | Attending: Internal Medicine | Admitting: Internal Medicine

## 2020-02-15 ENCOUNTER — Encounter: Payer: Self-pay | Admitting: Internal Medicine

## 2020-02-15 ENCOUNTER — Ambulatory Visit
Admission: RE | Admit: 2020-02-15 | Discharge: 2020-02-15 | Disposition: A | Discharge status: Home / Self Care | Payer: Medicare Other | Source: Ambulatory Visit | Attending: Internal Medicine | Admitting: Internal Medicine

## 2020-02-15 ENCOUNTER — Inpatient Hospital Stay (HOSPITAL_BASED_OUTPATIENT_CLINIC_OR_DEPARTMENT_OTHER): Payer: Medicare Other | Admitting: Internal Medicine

## 2020-02-15 VITALS — BP 119/61 | HR 64 | Temp 98.2°F | Resp 16 | Ht 67.0 in | Wt 169.0 lb

## 2020-02-15 DIAGNOSIS — C9000 Multiple myeloma not having achieved remission: Secondary | ICD-10-CM | POA: Diagnosis present

## 2020-02-15 DIAGNOSIS — C9001 Multiple myeloma in remission: Secondary | ICD-10-CM

## 2020-02-15 MED ORDER — ONDANSETRON HCL 8 MG PO TABS: ORAL_TABLET | ORAL | 1 refills | Status: DC

## 2020-02-15 NOTE — Assessment & Plan Note (Addendum)
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  Currently under surveillance clinically stable; but October 2021 labs worsening-M protein stable at 0.3 however kappa lambda light chain ratio rising kappa light chain 120.  Hemoglobin stable around 11; calcium normal; however renal function slightly getting worse [see below]  #Discussed with the patient and husband regarding worsening light chains; renal function.  Recommend further evaluation with skeletal survey; also evaluation with a bone marrow biopsy.  Discussed that restarting therapy would be an option if no other cause of worsening renal function was noted.  Will discuss with Dr. Candiss Norse.  # CKD stage IVcreatinine-creatinine 2/GFR 16- worsening- ? Sec to Kappa light chains  [Dr.Singh]   # RA- [on Plaquenil]- STABLE.  #DISPOSITION: # Skeletal survey asap # Bone marrow Biopsy- in 1 week # follow up 1 week post bone marrow Biopsy-MD; cbc/bmp/ Dr.B  Cc; Dr.Hande/ Dr.Kernodle

## 2020-02-15 NOTE — Progress Notes (Signed)
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 benign   Multiple myeloma not having achieved remission ([place])  12/18/2015 Initial Diagnosis   Multiple myeloma in remission [facility])          INTERVAL HISTORY: This is a pleasant 79 year-old female patient with above history of multiple Myeloma [status post autologous stem cell transplant May 2013 patient denies any] currently on surveillance/no maintenance therapy- Is here for follow-up.  Patient noted to have slightly worsening renal function with Dr. Candiss Norse earlier in the week.  Patient continues to have chronic low back pain not any worse.  Chronic tingling and numbness in extremities not any worse.  Chronic fatigue not any worse.   Review of Systems  Constitutional: Positive for malaise/fatigue. Negative for diaphoresis, fever and weight loss.       Cold intolerance.  HENT: Negative for nosebleeds and sore throat.   Eyes: Negative for double vision.  Respiratory: Negative for cough, hemoptysis, sputum production, shortness of breath and wheezing.   Cardiovascular: Negative for chest pain, palpitations, orthopnea and leg swelling.  Gastrointestinal: Negative for abdominal pain, blood in stool, constipation, diarrhea, heartburn, melena, nausea and vomiting.  Genitourinary: Negative for dysuria, frequency and urgency.  Musculoskeletal: Positive for back pain, joint pain and myalgias.  Skin: Negative.  Negative for itching  and rash.  Neurological: Positive for tingling. Negative for dizziness, focal weakness, weakness and headaches.  Endo/Heme/Allergies: Does not bruise/bleed easily.  Psychiatric/Behavioral: Negative for depression. The patient is not nervous/anxious and does not have insomnia.     ALLERGIES:  is allergic to isoniazid and keflex [cephalexin].  MEDICATIONS:  Current Outpatient Medications  Medication Sig Dispense Refill  . acetaminophen (TYLENOL) 325 MG tablet Take 650 mg by mouth every 6 (six) hours as needed for fever or headache.    . allopurinol (ZYLOPRIM) 300 MG tablet Take 300 mg by mouth daily.     Marland Kitchen ALPRAZolam (XANAX) 0.25 MG tablet Take 0.25 mg by mouth at bedtime.   0  . atenolol (TENORMIN) 50 MG tablet Take 0.5 tablets (25 mg total) by mouth daily. 30 tablet 0  . cetirizine (ZYRTEC) 10 MG tablet Take 10 mg by mouth 2 (two) times daily.     . fluticasone (FLONASE) 50 MCG/ACT nasal spray Place 2 sprays into the nose daily.     . hydroxychloroquine (PLAQUENIL) 200 MG tablet TAKE 2 TABLETS BY MOUTH EVERY DAY    . lansoprazole (PREVACID) 30 MG capsule Take 30 mg by mouth daily at 12 noon.    Marland Kitchen levothyroxine (SYNTHROID, LEVOTHROID) 75 MCG tablet Take 75 mcg by mouth daily before breakfast.   5  . oxybutynin (DITROPAN-XL) 10 MG 24 hr tablet Take 1 tablet (10 mg total) by mouth daily. 30 tablet 11  . triamterene-hydrochlorothiazide (MAXZIDE-25) 37.5-25 MG tablet TK 1 T PO PRN    . leflunomide (ARAVA) 10 MG tablet Take 1 tablet (10 mg total) by mouth daily. (Patient not taking: Reported on 02/15/2020)  1  . ondansetron (  ZOFRAN) 8 MG tablet One pill every 8 hours as needed for nausea/vomitting. 40 tablet 1   No current facility-administered medications for this visit.    PHYSICAL EXAMINATION: ECOG PERFORMANCE STATUS: 0 - Asymptomatic  BP 119/61 (BP Location: Left Arm, Patient Position: Sitting, Cuff Size: Normal)   Pulse 64   Temp 98.2 F (36.8 C) (Tympanic)   Resp 16   Ht _0   (1.702 m)   Wt 169 lb (76.7 kg)   SpO2 100%   BMI 26.47 kg/m   Filed Weights   02/15/20 1045  Weight: 169 lb (76.7 kg)    Physical Exam HENT:     Head: Normocephalic and atraumatic.     Mouth/Throat:     Pharynx: No oropharyngeal exudate.  Eyes:     Pupils: Pupils are equal, round, and reactive to light.  Cardiovascular:     Rate and Rhythm: Normal rate and regular rhythm.  Pulmonary:     Effort: No respiratory distress.     Breath sounds: No wheezing.  Abdominal:     General: Bowel sounds are normal. There is no distension.     Palpations: Abdomen is soft. There is no mass.     Tenderness: There is no abdominal tenderness. There is no guarding or rebound.  Musculoskeletal:        General: No tenderness. Normal range of motion.     Cervical back: Normal range of motion and neck supple.  Skin:    General: Skin is warm.  Neurological:     Mental Status: She is alert and oriented to person, place, and time.  Psychiatric:        Mood and Affect: Affect normal.     LABORATORY DATA:  I have reviewed the data as listed    Component Value Date/Time   NA 136 02/09/2020 1001   NA 142 08/16/2013 1028   K 4.4 02/09/2020 1001   K 4.5 08/16/2013 1028   CL 101 02/09/2020 1001   CL 106 08/16/2013 1028   CO2 23 02/09/2020 1001   CO2 27 08/16/2013 1028   GLUCOSE 102 (H) 02/09/2020 1001   GLUCOSE 99 08/16/2013 1028   BUN 46 (H) 02/09/2020 1001   BUN 21 (H) 08/16/2013 1028   CREATININE 2.68 (H) 02/09/2020 1001   CREATININE 1.42 (H) 09/07/2014 0954   CALCIUM 10.0 02/09/2020 1001   CALCIUM 9.3 09/07/2014 0954   PROT 7.3 02/09/2020 1001   PROT 7.4 08/16/2013 1028   ALBUMIN 4.4 02/09/2020 1001   ALBUMIN 3.9 08/16/2013 1028   AST 26 02/09/2020 1001   AST 27 08/16/2013 1028   ALT 11 02/09/2020 1001   ALT 15 08/16/2013 1028   ALKPHOS 60 02/09/2020 1001   ALKPHOS 61 08/16/2013 1028   BILITOT 0.7 02/09/2020 1001   BILITOT 0.3 08/16/2013 1028   GFRNONAA 16 (L) 02/09/2020 1001    GFRNONAA 36 (L) 09/07/2014 0954   GFRAA 19 (L) 02/09/2020 1001   GFRAA 42 (L) 09/07/2014 0954    No results found for: SPEP, UPEP  Lab Results  Component Value Date   WBC 7.9 02/09/2020   NEUTROABS 5.3 02/09/2020   HGB 11.7 (L) 02/09/2020   HCT 34.7 (L) 02/09/2020   MCV 95.3 02/09/2020   PLT 210 02/09/2020      Chemistry      Component Value Date/Time   NA 136 02/09/2020 1001   NA 142 08/16/2013 1028   K 4.4 02/09/2020 1001   K 4.5 08/16/2013 1028  CL 101 02/09/2020 1001   CL 106 08/16/2013 1028   CO2 23 02/09/2020 1001   CO2 27 08/16/2013 1028   BUN 46 (H) 02/09/2020 1001   BUN 21 (H) 08/16/2013 1028   CREATININE 2.68 (H) 02/09/2020 1001   CREATININE 1.42 (H) 09/07/2014 0954      Component Value Date/Time   CALCIUM 10.0 02/09/2020 1001   CALCIUM 9.3 09/07/2014 0954   ALKPHOS 60 02/09/2020 1001   ALKPHOS 61 08/16/2013 1028   AST 26 02/09/2020 1001   AST 27 08/16/2013 1028   ALT 11 02/09/2020 1001   ALT 15 08/16/2013 1028   BILITOT 0.7 02/09/2020 1001   BILITOT 0.3 08/16/2013 1028       RADIOGRAPHIC STUDIES: I have personally reviewed the radiological images as listed and agreed with the findings in the report. DG Bone Survey Met  Result Date: 02/15/2020 CLINICAL DATA:  Multiple myeloma. EXAM: METASTATIC BONE SURVEY COMPARISON:  February 07, 2015. FINDINGS: Stable lucencies are seen in the skull most likely are benign. Status post surgical posterior fusion of L4-5. Status post surgical anterior fusion of C5-6 and C6-7. No definite evidence of lytic or sclerotic lesion is noted. IMPRESSION: Stable lucencies are seen in the skull most likely are benign. No definite evidence of lytic or sclerotic lesion is noted. Electronically Signed   By: Marijo Conception M.D.   On: 02/15/2020 17:24     ASSESSMENT & PLAN:    Multiple myeloma not having achieved remission [facility]) # Multiple myeloma-Dx: 2012 s/p autologous stem cell transplant in May 2013.  Currently under  surveillance clinically stable; but October 2021 labs worsening-M protein stable at 0.3 however kappa lambda light chain ratio rising kappa light chain 120.  Hemoglobin stable around 11; calcium normal; however renal function slightly getting worse [see below]  #Discussed with the patient and husband regarding worsening light chains; renal function.  Recommend further evaluation with skeletal survey; also evaluation with a bone marrow biopsy.  Discussed that restarting therapy would be an option if no other cause of worsening renal function was noted.  Will discuss with Dr. Candiss Norse.  # CKD stage IVcreatinine-creatinine 2/GFR 16- worsening- ? Sec to Kappa light chains  [Dr.Singh]   # RA- [on Plaquenil]- STABLE.  #DISPOSITION: # Skeletal survey asap # Bone marrow Biopsy- in 1 week # follow up 1 week post bone marrow Biopsy-MD; cbc/bmp/ Dr.B  Cc; Dr.Hande/ Dr.Kernodle       Cammie Sickle, MD 02/16/2020 12:41 PM

## 2020-02-15 NOTE — Progress Notes (Signed)
Pt states she has been feeling really weak and feeling nauseated in the mornings when she gets up.

## 2020-02-16 ENCOUNTER — Telehealth: Payer: Self-pay | Admitting: Internal Medicine

## 2020-02-16 ENCOUNTER — Telehealth: Payer: Self-pay | Admitting: *Deleted

## 2020-02-16 NOTE — Telephone Encounter (Signed)
On 10/08-spoke to patient regarding results of the bone survey; overall stable.  Recommend bone marrow biopsy/follow-up as discussed.  Patient agreement.

## 2020-02-16 NOTE — Telephone Encounter (Signed)
Contacted patient. Discussed bone marrow biopsy date/instructions. BM biopsy is scheduled for 10/18 with an arrival time of 7:30 am. Patient was instructed to not eat/drink after midnight prior to the biopsy date.  She was also given her follow-up apts for Dr. Brahmanday on 10/25.  Patient expressed understanding and read back the above instructions. 

## 2020-02-19 ENCOUNTER — Other Ambulatory Visit: Payer: Self-pay

## 2020-02-19 ENCOUNTER — Ambulatory Visit (INDEPENDENT_AMBULATORY_CARE_PROVIDER_SITE_OTHER): Payer: Medicare Other | Admitting: Urology

## 2020-02-19 VITALS — BP 144/72 | HR 102 | Ht 65.0 in | Wt 172.0 lb

## 2020-02-19 DIAGNOSIS — N3946 Mixed incontinence: Secondary | ICD-10-CM

## 2020-02-19 MED ORDER — OXYBUTYNIN CHLORIDE ER 10 MG PO TB24: 10.0000 mg | ORAL_TABLET | Freq: Every day | ORAL | 11 refills | Status: DC

## 2020-02-19 NOTE — Progress Notes (Signed)
02/19/2020 8:50 AM   Sydney Gillespie 16-Jul-1940 [id number]  Referring provider: [name], MD [address] [facility] [place],  [place] [address]  Chief Complaint  Patient presents with  . Urinary Incontinence    HPI: She leaks with coughing sneezing bending and lifting. She has urge incontinence. Both are significant. She says her main complaint is when she goes from a sitting to standing position the urine just comes out. She wears 3 or 4 pads a day that can be soaked. She has moderately severe bedwetting.  Mild hypermobility of the bladder neck and a mild positive cough test with no prolapse  The patient has mixed incontinence. I think both likely are significant. I wonder if the overactive bladder component is the most significant. She does have milder to moderate frequency and nocturia. She also has moderately severe bedwetting. She likely is triggering overactivity when she goes from a sitting to standing position but she does have reasonably significant stress incontinence.If the patient ever did have her stress incontinence treated urethral injectables or sling would be reasonable  On urodynamics the patient did not void and was catheterized for 75 mL. Bladder capacity was 220 mL. The patient hadsensoryurgency. The bladder was unstable reaching pressures of 10 cm of water associated with severe leakage. She was triggering a lot of overactivity when she would cough. It made assessment of stress incontinence more difficult.At150 mL her cough leak point pressure is 58 cm of water associated with mild to moderate leakage. There was a lot of triggering. During voluntary voiding she voided 57 mL with a maximum flow of 8 mils per second and a residual was 100 mL. Bladder neck descent at 1 cm. The leakage was severe with her bladder overactivity. It was moderate in severity with stress maneuvers.  In my opinion approximately 80% of the  patient's bladder dysfunction is due to an overactive bladder. Medical and behavioral therapy discussed including physical therapy. If she does not reach her goal one would consider an outlet procedure but I would want to re-quantitate her goals and stress incontinence recognizing that arefractory overactive bladder therapy may be prudent to consider instead, especially with her bedwetting and triggering with standing.Reassess on Myrbetriq in 5 weeks  Patient on Myrbetriq has improved and now wears 2 pads a day instead of 3 or 4.   Would like to see her in 5 weeks on Toviaz 8 mg samples and Myrbetriq 50 mg. This is double therapy. I will take away the Myrbetriq if she is doing great. I will discuss surgery versus refractory therapies versus a trial of another antimuscarinic as add on double therapy with Myrbetriqnext time.  The patient's mixed incontinence is 80% better. At night she is dramatically better and only wears a liner. When the Myrbetriq samples stopped she started leaking a lot again high volume. She is very pleased. She can live with a stress incontinence.  Patient still leaks with activity and there was one high-volume episode this is uncommon. She said the stress incontinence is worse in the residual urge incontinence. She still wears 1 and sometimes 2 pads a day and agrees overall for her mixed incontinence she is about 80% better and reasonably pleased.  She was wondering what a sling in detail. I drew her a picture. I went through my entire template in detail. We talked about mesh issues. We talked about overactive bladder concerns with persistent or worsening symptoms.  Mixed incontinence. Patient now on oxybutynin and Myrbetriq.  I will see her in 1 year. She will call if she like to schedule surgery. She is leaning towards not doing surgery  Today Frequency stable.  Doing excellent on oxybutynin with very little urgency incontinence.  Clinically no  infections.  30x11 sent to pharmacy and see near   PMH: Past Medical History:  Diagnosis Date  . Allergic rhinitis   . Allergy   . Anemia   . Anemia   . Barrett's esophagus   . Blood dyscrasia    multiple myloma remission  . Cancer (HCC)   . Change in bowel habits   . Compression fracture 2013  . Degenerative disc disease, lumbar   . Dysrhythmia    palpitations  . Esophageal reflux   . Family history of adverse reaction to anesthesia    nausea -mom  . GERD (gastroesophageal reflux disease)   . Gout   . 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HCC)   . Heart palpitations   . Hyperlipidemia   . Hypothyroidism   . Inflammatory polyarthropathy (HCC)   . Lumbago   . Lumbar radiculitis   . Lumbar stenosis with neurogenic claudication   . Multiple myeloma (HCC)   . Multiple myeloma (HCC)   . Osteopenia   . Osteoporosis   . Other dysphagia   . Palpitations   . Positive PPD   . Renal insufficiency   . Rheumatoid arthritis (HCC)   . Rheumatoid arthritis (HCC)   . Seasonal allergies   . Vertigo     Surgical History: Past Surgical History:  Procedure Laterality Date  . ABDOMINAL HYSTERECTOMY    . APPENDECTOMY    . BACK SURGERY    . BREAST EXCISIONAL BIOPSY Bilateral    neg  . CHOLECYSTECTOMY    . COLONOSCOPY WITH PROPOFOL N/A 11/12/2016   Procedure: COLONOSCOPY WITH PROPOFOL;  Surgeon: Elliott, Robert T, MD;  Location: [facility] ENDOSCOPY;  Service: Endoscopy;  Laterality: N/A;  . ESOPHAGOGASTRODUODENOSCOPY (EGD) WITH PROPOFOL N/A 11/12/2016   Procedure: ESOPHAGOGASTRODUODENOSCOPY (EGD) WITH PROPOFOL;  Surgeon: Elliott, Robert T, MD;  Location: [facility] ENDOSCOPY;  Service: Endoscopy;  Laterality: N/A;  . FOOT SURGERY    . LUMBAR LAMINECTOMY/DECOMPRESSION MICRODISCECTOMY Left 06/18/2016   Procedure: LAMINECTOMY FOR FACET/SYNOVIAL CYST LUMBAR THREE - LUMBAR FOUR LEFT;  Surgeon: Jeffrey Jenkins, MD;  Location: [facility] OR;  Service: Neurosurgery;  Laterality: Left;  LAMINECTOMY FOR FACET/SYNOVIAL CYST  LUMBAR THREE - LUMBAR FOUR LEFT    Home Medications:  Allergies as of 02/19/2020      Reactions   Isoniazid Other (See Comments)   Blisters    Keflex [cephalexin] Other (See Comments)   Blisters      Medication List       Accurate as of February 19, 2020  8:50 AM. If you have any questions, ask your nurse or doctor.        acetaminophen 325 MG tablet Commonly known as: TYLENOL Take 650 mg by mouth every 6 (six) hours as needed for fever or headache.   allopurinol 300 MG tablet Commonly known as: ZYLOPRIM Take 300 mg by mouth daily.   ALPRAZolam 0.25 MG tablet Commonly known as: XANAX Take 0.25 mg by mouth at bedtime.   atenolol 50 MG tablet Commonly known as: TENORMIN Take 0.5 tablets (25 mg total) by mouth daily.   cetirizine 10 MG tablet Commonly known as: ZYRTEC Take 10 mg by mouth 2 (two) times daily.   DULoxetine 20 MG capsule Commonly known as: CYMBALTA Take 20 mg by mouth daily.     fluticasone 50 MCG/ACT nasal spray Commonly known as: FLONASE Place 2 sprays into the nose daily.   hydroxychloroquine 200 MG tablet Commonly known as: PLAQUENIL TAKE 2 TABLETS BY MOUTH EVERY DAY   lansoprazole 30 MG capsule Commonly known as: PREVACID Take 30 mg by mouth daily at 12 noon.   leflunomide 10 MG tablet Commonly known as: ARAVA Take 1 tablet (10 mg total) by mouth daily.   levothyroxine 75 MCG tablet Commonly known as: SYNTHROID Take 75 mcg by mouth daily before breakfast.   ondansetron 8 MG tablet Commonly known as: ZOFRAN One pill every 8 hours as needed for nausea/vomitting.   oxybutynin 10 MG 24 hr tablet Commonly known as: DITROPAN-XL Take 1 tablet (10 mg total) by mouth daily.   rOPINIRole 0.25 MG tablet Commonly known as: REQUIP Take 0.25 mg by mouth at bedtime.   spironolactone 25 MG tablet Commonly known as: ALDACTONE Take 25 mg by mouth daily.   triamterene-hydrochlorothiazide 37.5-25 MG tablet Commonly known as: MAXZIDE-25 TK 1 T  PO PRN   vitamin B-12 1000 MCG tablet Commonly known as: CYANOCOBALAMIN Take 1,000 mcg by mouth daily.       Allergies:  Allergies  Allergen Reactions  . Isoniazid Other (See Comments)    Blisters   . Keflex [Cephalexin] Other (See Comments)    Blisters     Family History: Family History  Problem Relation Age of Onset  . Hypertension Mother   . Heart attack Mother   . Rheum arthritis Mother   . Osteoarthritis Mother   . Cancer Father   . Hypertension Father   . Stroke Father   . Breast cancer Maternal Grandmother 40  . Osteoarthritis Other   . Rheum arthritis Other     Social History:  reports that she has never smoked. She has never used smokeless tobacco. She reports that she does not drink alcohol and does not use drugs.  ROS:                                        Physical Exam: BP (!) 144/72 (BP Location: Left Arm, Patient Position: Sitting, Cuff Size: Normal)   Pulse (!) 102   Ht 5' 5" (1.651 m)   Wt 172 lb (78 kg)   BMI 28.62 kg/m   Constitutional:  Alert and oriented, No acute distress.   Laboratory Data: Lab Results  Component Value Date   WBC 7.9 02/09/2020   HGB 11.7 (L) 02/09/2020   HCT 34.7 (L) 02/09/2020   MCV 95.3 02/09/2020   PLT 210 02/09/2020    Lab Results  Component Value Date   CREATININE 2.68 (H) 02/09/2020    No results found for: PSA  No results found for: TESTOSTERONE  No results found for: HGBA1C  Urinalysis    Component Value Date/Time   APPEARANCEUR Clear 07/12/2017 1407   GLUCOSEU Negative 07/12/2017 1407   BILIRUBINUR Negative 07/12/2017 1407   PROTEINUR Negative 07/12/2017 1407   NITRITE Negative 07/12/2017 1407   LEUKOCYTESUR Negative 07/12/2017 1407    Pertinent Imaging:   Assessment & Plan: See in 1 year  There are no diagnoses linked to this encounter.  No follow-ups on file.   A , MD  [facility] [address], [address] , [place] [address] [phone]  

## 2020-02-23 ENCOUNTER — Other Ambulatory Visit: Payer: Self-pay | Admitting: Radiology

## 2020-02-23 NOTE — Progress Notes (Signed)
Patient on schedule for [facility] 02/26/2020, spoke with patient on phone and made aware to be here @ 0730, NPO after MN prior to procedure as well as driver post procedure/discharge. Stated understanding.

## 2020-02-26 ENCOUNTER — Other Ambulatory Visit: Payer: Self-pay

## 2020-02-26 ENCOUNTER — Ambulatory Visit
Admission: RE | Admit: 2020-02-26 | Discharge: 2020-02-26 | Disposition: A | Discharge status: Home / Self Care | Payer: Medicare Other | Source: Ambulatory Visit | Attending: Internal Medicine | Admitting: Internal Medicine

## 2020-02-26 DIAGNOSIS — D649 Anemia, unspecified: Secondary | ICD-10-CM | POA: Insufficient documentation

## 2020-02-26 DIAGNOSIS — M810 Age-related osteoporosis without current pathological fracture: Secondary | ICD-10-CM | POA: Insufficient documentation

## 2020-02-26 DIAGNOSIS — E785 Hyperlipidemia, unspecified: Secondary | ICD-10-CM | POA: Insufficient documentation

## 2020-02-26 DIAGNOSIS — M069 Rheumatoid arthritis, unspecified: Secondary | ICD-10-CM | POA: Insufficient documentation

## 2020-02-26 DIAGNOSIS — E039 Hypothyroidism, unspecified: Secondary | ICD-10-CM | POA: Diagnosis not present

## 2020-02-26 DIAGNOSIS — K219 Gastro-esophageal reflux disease without esophagitis: Secondary | ICD-10-CM | POA: Diagnosis not present

## 2020-02-26 DIAGNOSIS — Z9289 Personal history of other medical treatment: Secondary | ICD-10-CM | POA: Insufficient documentation

## 2020-02-26 DIAGNOSIS — Z79899 Other long term (current) drug therapy: Secondary | ICD-10-CM | POA: Insufficient documentation

## 2020-02-26 DIAGNOSIS — C9000 Multiple myeloma not having achieved remission: Secondary | ICD-10-CM | POA: Insufficient documentation

## 2020-02-26 DIAGNOSIS — Z79890 Hormone replacement therapy (postmenopausal): Secondary | ICD-10-CM | POA: Insufficient documentation

## 2020-02-26 LAB — CBC WITH DIFFERENTIAL/PLATELET
Abs Immature Granulocytes: 0.03 10*3/uL (ref 0.00–0.07)
Basophils Absolute: 0.1 10*3/uL (ref 0.0–0.1)
Basophils Relative: 1 %
Eosinophils Absolute: 0.5 10*3/uL (ref 0.0–0.5)
Eosinophils Relative: 6 %
HCT: 35.9 % — ABNORMAL LOW (ref 36.0–46.0)
Hemoglobin: 11.7 g/dL — ABNORMAL LOW (ref 12.0–15.0)
Immature Granulocytes: 0 %
Lymphocytes Relative: 11 %
Lymphs Abs: 0.8 10*3/uL (ref 0.7–4.0)
MCH: 32.2 pg (ref 26.0–34.0)
MCHC: 32.6 g/dL (ref 30.0–36.0)
MCV: 98.9 fL (ref 80.0–100.0)
Monocytes Absolute: 0.6 10*3/uL (ref 0.1–1.0)
Monocytes Relative: 8 %
Neutro Abs: 5.6 10*3/uL (ref 1.7–7.7)
Neutrophils Relative %: 74 %
Platelets: 160 10*3/uL (ref 150–400)
RBC: 3.63 MIL/uL — ABNORMAL LOW (ref 3.87–5.11)
RDW: 13.2 % (ref 11.5–15.5)
WBC: 7.5 10*3/uL (ref 4.0–10.5)
nRBC: 0 % (ref 0.0–0.2)

## 2020-02-26 MED ORDER — FENTANYL CITRATE (PF) 100 MCG/2ML IJ SOLN
INTRAMUSCULAR | Status: AC | PRN
  Administered 2020-02-26 (×2): 50 ug via INTRAVENOUS

## 2020-02-26 MED ORDER — FENTANYL CITRATE (PF) 100 MCG/2ML IJ SOLN
INTRAMUSCULAR | Status: AC
  Filled 2020-02-26: qty 2

## 2020-02-26 MED ORDER — HEPARIN SOD (PORK) LOCK FLUSH 100 UNIT/ML IV SOLN
INTRAVENOUS | Status: AC
  Filled 2020-02-26: qty 5

## 2020-02-26 MED ORDER — MIDAZOLAM HCL 2 MG/2ML IJ SOLN
INTRAMUSCULAR | Status: AC
  Filled 2020-02-26: qty 2

## 2020-02-26 MED ORDER — MIDAZOLAM HCL 2 MG/2ML IJ SOLN
INTRAMUSCULAR | Status: AC | PRN
  Administered 2020-02-26 (×2): 1 mg via INTRAVENOUS

## 2020-02-26 MED ORDER — SODIUM CHLORIDE 0.9 % IV SOLN: INTRAVENOUS | Status: DC

## 2020-02-26 NOTE — H&P (Signed)
Chief Complaint:  Multiple myeloma  Referring Physician(s): Brahmanday,Govinda R   History of Present Illness: Sydney Gillespie is a 80 y.o. female with a history of multiple myeloma from 2012.  She is status post autologous stem cell transplant in 2013.  She has been doing well clinically.  Surveillance labs demonstrate increased M protein as well as kappa light chain.  These lab abnormalities are concerning for myeloma recurrence.  Overall she is doing well today.  Review of systems negative.  No physical limitations.  Excellent functional status.  No recent illness or fevers.  Past Medical History:  Diagnosis Date  . Allergic rhinitis   . Allergy   . Anemia   . Anemia   . Barrett's esophagus   . Blood dyscrasia    multiple myloma remission  . Cancer ([place])   . Change in bowel habits   . Compression fracture 2013  . Degenerative disc disease, lumbar   . Dysrhythmia    palpitations  . Esophageal reflux   . Family history of adverse reaction to anesthesia    nausea -mom  . GERD (gastroesophageal reflux disease)   . Gout   . 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[place])   . Heart palpitations   . Hyperlipidemia   . Hypothyroidism   . Inflammatory polyarthropathy ([place])   . Lumbago   . Lumbar radiculitis   . Lumbar stenosis with neurogenic claudication   . Multiple myeloma ([place])   . Multiple myeloma ([place])   . Osteopenia   . Osteoporosis   . Other dysphagia   . Palpitations   . Positive PPD   . Renal insufficiency   . Rheumatoid arthritis ([place])   . Rheumatoid arthritis ([place])   . Seasonal allergies   . Vertigo     Past Surgical History:  Procedure Laterality Date  . ABDOMINAL HYSTERECTOMY    . APPENDECTOMY    . BACK SURGERY    . BREAST EXCISIONAL BIOPSY Bilateral    neg  . CHOLECYSTECTOMY    . COLONOSCOPY WITH PROPOFOL N/A 11/12/2016   Procedure: COLONOSCOPY WITH PROPOFOL;  Surgeon: Manya Silvas, MD;  Location[facility] ENDOSCOPY;  Service: Endoscopy;  Laterality:  N/A;  . ESOPHAGOGASTRODUODENOSCOPY (EGD) WITH PROPOFOL N/A 11/12/2016   Procedure: ESOPHAGOGASTRODUODENOSCOPY (EGD) WITH PROPOFOL;  Surgeon: Manya Silvas, MD;  Location: [facility] ENDOSCOPY;  Service: Endoscopy;  Laterality: N/A;  . FOOT SURGERY    . LUMBAR LAMINECTOMY/DECOMPRESSION MICRODISCECTOMY Left 06/18/2016   Procedure: LAMINECTOMY FOR FACET/SYNOVIAL CYST LUMBAR THREE - LUMBAR FOUR LEFT;  Surgeon: Newman Pies, MD;  Location: [place];  Service: Neurosurgery;  Laterality: Left;  LAMINECTOMY FOR FACET/SYNOVIAL CYST LUMBAR THREE - LUMBAR FOUR LEFT    Allergies: Isoniazid and Keflex [cephalexin]  Medications: Prior to Admission medications   Medication Sig Start Date End Date Taking? Authorizing Provider  acetaminophen (TYLENOL) 325 MG tablet Take 650 mg by mouth every 6 (six) hours as needed for fever or headache.   Yes [provider]  allopurinol (ZYLOPRIM) 300 MG tablet Take 300 mg by mouth daily.  09/14/14  Yes [provider]  ALPRAZolam (XANAX) 0.25 MG tablet Take 0.25 mg by mouth at bedtime.  10/18/15  Yes [provider]  atenolol (TENORMIN) 50 MG tablet Take 0.5 tablets (25 mg total) by mouth daily. 09/05/18  Yes Ghimire, Henreitta Leber, MD  cetirizine (ZYRTEC) 10 MG tablet Take 10 mg by mouth 2 (two) times daily.    Yes [provider]  DULoxetine (CYMBALTA) 20 MG capsule Take  20 mg by mouth daily. 01/24/20  Yes [provider]  fluticasone (FLONASE) 50 MCG/ACT nasal spray Place 2 sprays into the nose daily.    Yes [provider]  hydroxychloroquine (PLAQUENIL) 200 MG tablet TAKE 2 TABLETS BY MOUTH EVERY DAY 10/25/18  Yes [provider]  lansoprazole (PREVACID) 30 MG capsule Take 30 mg by mouth daily at 12 noon.   Yes [provider]  leflunomide (ARAVA) 10 MG tablet Take 1 tablet (10 mg total) by mouth daily. 09/13/18  Yes Ghimire, Henreitta Leber, MD  levothyroxine (SYNTHROID, LEVOTHROID) 75 MCG tablet Take 75 mcg by mouth  daily before breakfast.  11/02/17  Yes [provider]  ondansetron (ZOFRAN) 8 MG tablet One pill every 8 hours as needed for nausea/vomitting. 02/15/20  Yes Cammie Sickle, MD  oxybutynin (DITROPAN-XL) 10 MG 24 hr tablet Take 1 tablet (10 mg total) by mouth daily. 02/19/20  Yes MacDiarmid, Nicki Reaper, MD  rOPINIRole (REQUIP) 0.25 MG tablet Take 0.25 mg by mouth at bedtime. 01/31/20  Yes [provider]  spironolactone (ALDACTONE) 25 MG tablet Take 25 mg by mouth daily. 10/18/19  Yes [provider]  triamterene-hydrochlorothiazide (MAXZIDE-25) 37.5-25 MG tablet TK 1 T PO PRN 09/18/18  Yes [provider]  vitamin B-12 (CYANOCOBALAMIN) 1000 MCG tablet Take 1,000 mcg by mouth daily. 01/03/20  Yes [provider]     Family History  Problem Relation Age of Onset  . Hypertension Mother   . Heart attack Mother   . Rheum arthritis Mother   . Osteoarthritis Mother   . Cancer Father   . Hypertension Father   . Stroke Father   . Breast cancer Maternal Grandmother 57  . Osteoarthritis Other   . Rheum arthritis Other     Social History   Socioeconomic History  . Marital status: Married    Spouse name: Not on file  . Number of children: Not on file  . Years of education: Not on file  . Highest education level: Not on file  Occupational History  . Not on file  Tobacco Use  . Smoking status: Never Smoker  . Smokeless tobacco: Never Used  Vaping Use  . Vaping Use: Never used  Substance and Sexual Activity  . Alcohol use: No  . Drug use: No  . Sexual activity: Yes  Other Topics Concern  . Not on file  Social History Narrative  . Not on file   Social Determinants of Health   Financial Resource Strain:   . Difficulty of Paying Living Expenses: Not on file  Food Insecurity:   . Worried About Charity fundraiser in the Last Year: Not on file  . Ran Out of Food in the Last Year: Not on file  Transportation Needs:   . Lack of Transportation  (Medical): Not on file  . Lack of Transportation (Non-Medical): Not on file  Physical Activity:   . Days of Exercise per Week: Not on file  . Minutes of Exercise per Session: Not on file  Stress:   . Feeling of Stress : Not on file  Social Connections:   . Frequency of Communication with Friends and Family: Not on file  . Frequency of Social Gatherings with Friends and Family: Not on file  . Attends Religious Services: Not on file  . Active Member of Clubs or Organizations: Not on file  . Attends Archivist Meetings: Not on file  . Marital Status: Not on file    ECOG  Status: 1 - Symptomatic but completely ambulatory  Review of Systems: A 12 point ROS discussed and pertinent positives are indicated in the HPI above.  All other systems are negative.  Review of Systems  Vital Signs: BP 134/60   Pulse 67   Temp 100 F (37.8 C) (Oral)   Resp 20   Ht '5\' 5"'  (1.651 m)   Wt 76.7 kg   SpO2 95%   BMI 28.12 kg/m   Physical Exam Constitutional:      General: She is not in acute distress.    Appearance: She is not toxic-appearing.  Eyes:     General: No scleral icterus.    Conjunctiva/sclera: Conjunctivae normal.  Cardiovascular:     Rate and Rhythm: Normal rate and regular rhythm.  Pulmonary:     Effort: Pulmonary effort is normal.     Breath sounds: Normal breath sounds.  Abdominal:     General: Bowel sounds are normal.     Palpations: Abdomen is soft.  Neurological:     General: No focal deficit present.     Mental Status: She is alert. Mental status is at baseline.  Psychiatric:        Mood and Affect: Mood normal.     Imaging: DG Bone Survey Met  Result Date: 02/15/2020 CLINICAL DATA:  Multiple myeloma. EXAM: METASTATIC BONE SURVEY COMPARISON:  February 07, 2015. FINDINGS: Stable lucencies are seen in the skull most likely are benign. Status post surgical posterior fusion of L4-5. Status post surgical anterior fusion of C5-6 and C6-7. No definite  evidence of lytic or sclerotic lesion is noted. IMPRESSION: Stable lucencies are seen in the skull most likely are benign. No definite evidence of lytic or sclerotic lesion is noted. Electronically Signed   By: Marijo Conception M.D.   On: 02/15/2020 17:24    Labs:  CBC: Recent Labs    08/10/19 1014 11/09/19 1046 02/09/20 1001 02/26/20 0747  WBC 8.2 8.0 7.9 7.5  HGB 10.9* 10.8* 11.7* 11.7*  HCT 34.2* 33.1* 34.7* 35.9*  PLT 162 171 210 160    COAGS: No results for input(s): INR, APTT in the last 8760 hours.  BMP: Recent Labs    05/18/19 1053 08/10/19 1014 11/09/19 1046 02/09/20 1001  NA 137 139 137 136  K 4.2 4.2 4.0 4.4  CL 110 109 105 101  CO2 19* 21* 22 23  GLUCOSE 103* 97 123* 102*  BUN 26* 37* 31* 46*  CALCIUM 8.6* 9.6 9.3 10.0  CREATININE 1.75* 1.88* 2.00* 2.68*  GFRNONAA 27* 25* 23* 16*  GFRAA 32* 29* 27* 19*    LIVER FUNCTION TESTS: Recent Labs    05/18/19 1053 08/10/19 1014 11/09/19 1046 02/09/20 1001  BILITOT 0.4 0.5 0.5 0.7  AST '25 26 22 26  ' ALT '10 12 10 11  ' ALKPHOS 48 52 66 60  PROT 6.8 7.2 7.3 7.3  ALBUMIN 4.1 4.3 4.0 4.4    TUMOR MARKERS: No results for input(s): AFPTM, CEA, CA199, CHROMGRNA in the last 8760 hours.  Assessment and Plan:  History of multiple myeloma, status post previous autologous bone marrow transplant.  Surveillance lab abnormalities demonstrate increased M protein and kappa light chain.  Plan for bone marrow biopsy today with CT guidance.  Procedure reviewed including the risk, benefits and alternatives.  Consent obtained.  Risks and benefits of CT bone marrow biopsy was discussed with the patient and/or patient's family including, but not limited to bleeding, infection, damage to adjacent structures or low yield  requiring additional tests.  All of the questions were answered and there is agreement to proceed.  Consent signed and in chart.    Thank you for this interesting consult.  I greatly enjoyed meeting  Sydney Gillespie and look forward to participating in their care.  A copy of this report was sent to the requesting provider on this date.  Electronically Signed: Greggory Keen, MD 02/26/2020, 8:35 AM   I spent a total of  30 Minutes   in face to face in clinical consultation, greater than 50% of which was counseling/coordinating care for this patient with multiple myeloma

## 2020-02-26 NOTE — Procedures (Signed)
Interventional Radiology Procedure Note  Procedure: Ct bm asp and core bx  Complications: None  Estimated Blood Loss:  min  Findings: 11 g core and asp    M. Daryll Brod, MD

## 2020-03-04 ENCOUNTER — Ambulatory Visit: Payer: Medicare Other | Admitting: Internal Medicine

## 2020-03-04 ENCOUNTER — Inpatient Hospital Stay: Payer: Medicare Other

## 2020-03-04 ENCOUNTER — Inpatient Hospital Stay (HOSPITAL_BASED_OUTPATIENT_CLINIC_OR_DEPARTMENT_OTHER): Payer: Medicare Other | Admitting: Internal Medicine

## 2020-03-04 ENCOUNTER — Other Ambulatory Visit: Payer: Self-pay

## 2020-03-04 DIAGNOSIS — C9001 Multiple myeloma in remission: Secondary | ICD-10-CM | POA: Diagnosis not present

## 2020-03-04 DIAGNOSIS — C9000 Multiple myeloma not having achieved remission: Secondary | ICD-10-CM

## 2020-03-04 LAB — CBC WITH DIFFERENTIAL/PLATELET
Abs Immature Granulocytes: 0.29 10*3/uL — ABNORMAL HIGH (ref 0.00–0.07)
Basophils Absolute: 0 10*3/uL (ref 0.0–0.1)
Basophils Relative: 0 %
Eosinophils Absolute: 0 10*3/uL (ref 0.0–0.5)
Eosinophils Relative: 0 %
HCT: 35.7 % — ABNORMAL LOW (ref 36.0–46.0)
Hemoglobin: 11.8 g/dL — ABNORMAL LOW (ref 12.0–15.0)
Immature Granulocytes: 2 %
Lymphocytes Relative: 11 %
Lymphs Abs: 1.4 10*3/uL (ref 0.7–4.0)
MCH: 32.2 pg (ref 26.0–34.0)
MCHC: 33.1 g/dL (ref 30.0–36.0)
MCV: 97.3 fL (ref 80.0–100.0)
Monocytes Absolute: 0.5 10*3/uL (ref 0.1–1.0)
Monocytes Relative: 4 %
Neutro Abs: 10.8 10*3/uL — ABNORMAL HIGH (ref 1.7–7.7)
Neutrophils Relative %: 83 %
Platelets: 223 10*3/uL (ref 150–400)
RBC: 3.67 MIL/uL — ABNORMAL LOW (ref 3.87–5.11)
RDW: 13.3 % (ref 11.5–15.5)
WBC: 13.1 10*3/uL — ABNORMAL HIGH (ref 4.0–10.5)
nRBC: 0 % (ref 0.0–0.2)

## 2020-03-04 LAB — COMPREHENSIVE METABOLIC PANEL
ALT: 10 U/L (ref 0–44)
AST: 24 U/L (ref 15–41)
Albumin: 4.3 g/dL (ref 3.5–5.0)
Alkaline Phosphatase: 59 U/L (ref 38–126)
Anion gap: 10 (ref 5–15)
BUN: 51 mg/dL — ABNORMAL HIGH (ref 8–23)
CO2: 19 mmol/L — ABNORMAL LOW (ref 22–32)
Calcium: 8.9 mg/dL (ref 8.9–10.3)
Chloride: 106 mmol/L (ref 98–111)
Creatinine, Ser: 2.39 mg/dL — ABNORMAL HIGH (ref 0.44–1.00)
GFR, Estimated: 20 mL/min — ABNORMAL LOW (ref >60)
Glucose, Bld: 105 mg/dL — ABNORMAL HIGH (ref 70–99)
Potassium: 3.7 mmol/L (ref 3.5–5.1)
Sodium: 135 mmol/L (ref 135–145)
Total Bilirubin: 0.6 mg/dL (ref 0.3–1.2)
Total Protein: 7.6 g/dL (ref 6.5–8.1)

## 2020-03-04 NOTE — Progress Notes (Signed)
[place] OFFICE PROGRESS NOTE  Patient Care Team: [name], MD as PCP - General (Internal Medicine) Cammie Sickle, MD as Consulting Physician (Hematology and Oncology)  HPI  SUMMARY OF ONCOLOGIC HISTORY:  Oncology History Overview Note  # 2012- MULTIPLE MYELOMA IgG Kappa Light chain FISH del13; s/p AUTO- BMT [MAY 2013; Dr.Gabriel; [facility]] AUG 2017- M-PROTEIN NEG; K/L= 1.49/N; NO MAINTENANCE THERAPY  # OCT 2021- RELAPSED;  BMBx- plasma cells arranged in small clusters and aggregates [10% of total marrow cellularity]  Overall, the features are worrisome for early relapse of the patient's previously diagnosed plasma cell neoplasm.  No evidence of organ dysfunction; continue close surveillance.  # CKD [~ creat 1.35- 1.7] ; Dr.Singh  # 2012- LEFT KIDNEY ? 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 benign   Multiple myeloma not having achieved remission ([place])  12/18/2015 Initial Diagnosis   Multiple myeloma in remission [facility])          INTERVAL HISTORY: This is a pleasant 79 year-old female patient with above history of multiple Myeloma [status post autologous stem cell transplant May 2013 patient denies any] currently on surveillance/no maintenance therapy- Is here for follow-up to review the results of the bone marrow biopsy.   Patient noted to have slightly worsening renal function with Dr. Candiss Norse earlier in the week.  Patient continues to have chronic low back pain not any worse.  Chronic tingling and numbness in extremities not any worse.  Chronic fatigue not any worse.   Review of Systems  Constitutional: Positive for malaise/fatigue. Negative for diaphoresis, fever and weight loss.       Cold intolerance.  HENT: Negative for nosebleeds and sore throat.   Eyes: Negative for double vision.  Respiratory: Negative for cough, hemoptysis, sputum production, shortness of breath and wheezing.   Cardiovascular: Negative for chest  pain, palpitations, orthopnea and leg swelling.  Gastrointestinal: Negative for abdominal pain, blood in stool, constipation, diarrhea, heartburn, melena, nausea and vomiting.  Genitourinary: Negative for dysuria, frequency and urgency.  Musculoskeletal: Positive for back pain, joint pain and myalgias.  Skin: Negative.  Negative for itching and rash.  Neurological: Positive for tingling. Negative for dizziness, focal weakness, weakness and headaches.  Endo/Heme/Allergies: Does not bruise/bleed easily.  Psychiatric/Behavioral: Negative for depression. The patient is not nervous/anxious and does not have insomnia.     ALLERGIES:  is allergic to isoniazid and keflex [cephalexin].  MEDICATIONS:  Current Outpatient Medications  Medication Sig Dispense Refill  . acetaminophen (TYLENOL) 325 MG tablet Take 650 mg by mouth every 6 (six) hours as needed for fever or headache.    . albuterol (VENTOLIN HFA) 108 (90 Base) MCG/ACT inhaler Inhale into the lungs.    Marland Kitchen allopurinol (ZYLOPRIM) 300 MG tablet Take 300 mg by mouth daily.     Marland Kitchen ALPRAZolam (XANAX) 0.25 MG tablet Take 0.25 mg by mouth at bedtime.   0  . atenolol (TENORMIN) 50 MG tablet Take 0.5 tablets (25 mg total) by mouth daily. 30 tablet 0  . cetirizine (ZYRTEC) 10 MG tablet Take 10 mg by mouth 2 (two) times daily.     Marland Kitchen doxycycline (VIBRAMYCIN) 100 MG capsule Take by mouth.    . DULoxetine (CYMBALTA) 20 MG capsule Take 20 mg by mouth daily.    . fluticasone (FLONASE) 50 MCG/ACT nasal spray Place 2 sprays into the nose daily.     . hydroxychloroquine (PLAQUENIL) 200 MG tablet TAKE 2 TABLETS BY MOUTH EVERY DAY    .  lansoprazole (PREVACID) 30 MG capsule Take 30 mg by mouth daily at 12 noon.    . leflunomide (ARAVA) 10 MG tablet Take 1 tablet (10 mg total) by mouth daily.  1  . levothyroxine (SYNTHROID, LEVOTHROID) 75 MCG tablet Take 75 mcg by mouth daily before breakfast.   5  . ondansetron (ZOFRAN) 8 MG tablet One pill every 8 hours as needed  for nausea/vomitting. 40 tablet 1  . oxybutynin (DITROPAN-XL) 10 MG 24 hr tablet Take 1 tablet (10 mg total) by mouth daily. 30 tablet 11  . predniSONE (DELTASONE) 10 MG tablet Take by mouth.    Marland Kitchen rOPINIRole (REQUIP) 0.25 MG tablet Take 0.25 mg by mouth at bedtime.    Marland Kitchen spironolactone (ALDACTONE) 25 MG tablet Take 25 mg by mouth daily.    Marland Kitchen triamterene-hydrochlorothiazide (MAXZIDE-25) 37.5-25 MG tablet TK 1 T PO PRN    . vitamin B-12 (CYANOCOBALAMIN) 1000 MCG tablet Take 1,000 mcg by mouth daily.     No current facility-administered medications for this visit.    PHYSICAL EXAMINATION: ECOG PERFORMANCE STATUS: 0 - Asymptomatic  BP 136/60 (BP Location: Left Arm, Patient Position: Sitting, Cuff Size: Normal)   Pulse 66   Temp 98.1 F (36.7 C) (Tympanic)   Resp 18   Ht _0  (1.651 m)   Wt 167 lb (75.8 kg)   SpO2 100%   BMI 27.79 kg/m   Filed Weights   03/04/20 1307  Weight: 167 lb (75.8 kg)    Physical Exam HENT:     Head: Normocephalic and atraumatic.     Mouth/Throat:     Pharynx: No oropharyngeal exudate.  Eyes:     Pupils: Pupils are equal, round, and reactive to light.  Cardiovascular:     Rate and Rhythm: Normal rate and regular rhythm.  Pulmonary:     Effort: No respiratory distress.     Breath sounds: No wheezing.  Abdominal:     General: Bowel sounds are normal. There is no distension.     Palpations: Abdomen is soft. There is no mass.     Tenderness: There is no abdominal tenderness. There is no guarding or rebound.  Musculoskeletal:        General: No tenderness. Normal range of motion.     Cervical back: Normal range of motion and neck supple.  Skin:    General: Skin is warm.  Neurological:     Mental Status: She is alert and oriented to person, place, and time.  Psychiatric:        Mood and Affect: Affect normal.     LABORATORY DATA:  I have reviewed the data as listed    Component Value Date/Time   NA 135 03/04/2020 1249   NA 142 08/16/2013  1028   K 3.7 03/04/2020 1249   K 4.5 08/16/2013 1028   CL 106 03/04/2020 1249   CL 106 08/16/2013 1028   CO2 19 (L) 03/04/2020 1249   CO2 27 08/16/2013 1028   GLUCOSE 105 (H) 03/04/2020 1249   GLUCOSE 99 08/16/2013 1028   BUN 51 (H) 03/04/2020 1249   BUN 21 (H) 08/16/2013 1028   CREATININE 2.39 (H) 03/04/2020 1249   CREATININE 1.42 (H) 09/07/2014 0954   CALCIUM 8.9 03/04/2020 1249   CALCIUM 9.3 09/07/2014 0954   PROT 7.6 03/04/2020 1249   PROT 7.4 08/16/2013 1028   ALBUMIN 4.3 03/04/2020 1249   ALBUMIN 3.9 08/16/2013 1028   AST 24 03/04/2020 1249   AST 27 08/16/2013 1028  ALT 10 03/04/2020 1249   ALT 15 08/16/2013 1028   ALKPHOS 59 03/04/2020 1249   ALKPHOS 61 08/16/2013 1028   BILITOT 0.6 03/04/2020 1249   BILITOT 0.3 08/16/2013 1028   GFRNONAA 20 (L) 03/04/2020 1249   GFRNONAA 36 (L) 09/07/2014 0954   GFRAA 19 (L) 02/09/2020 1001   GFRAA 42 (L) 09/07/2014 0954    No results found for: SPEP, UPEP  Lab Results  Component Value Date   WBC 13.1 (H) 03/04/2020   NEUTROABS 10.8 (H) 03/04/2020   HGB 11.8 (L) 03/04/2020   HCT 35.7 (L) 03/04/2020   MCV 97.3 03/04/2020   PLT 223 03/04/2020      Chemistry      Component Value Date/Time   NA 135 03/04/2020 1249   NA 142 08/16/2013 1028   K 3.7 03/04/2020 1249   K 4.5 08/16/2013 1028   CL 106 03/04/2020 1249   CL 106 08/16/2013 1028   CO2 19 (L) 03/04/2020 1249   CO2 27 08/16/2013 1028   BUN 51 (H) 03/04/2020 1249   BUN 21 (H) 08/16/2013 1028   CREATININE 2.39 (H) 03/04/2020 1249   CREATININE 1.42 (H) 09/07/2014 0954      Component Value Date/Time   CALCIUM 8.9 03/04/2020 1249   CALCIUM 9.3 09/07/2014 0954   ALKPHOS 59 03/04/2020 1249   ALKPHOS 61 08/16/2013 1028   AST 24 03/04/2020 1249   AST 27 08/16/2013 1028   ALT 10 03/04/2020 1249   ALT 15 08/16/2013 1028   BILITOT 0.6 03/04/2020 1249   BILITOT 0.3 08/16/2013 1028       RADIOGRAPHIC STUDIES: I have personally reviewed the radiological images as  listed and agreed with the findings in the report. No results found.   ASSESSMENT & PLAN:    Multiple myeloma not having achieved remission [facility]) # Multiple myeloma-Dx: 2012 s/p autologous stem cell transplant in May 2013; OCT 2021- BMBx- plasma cells arranged in small clusters and aggregates [10% of total marrow cellularity] Overall, the features are worrisome for early relapse of the patient's previously diagnosed plasma cell neoplasm.  October 2021 labs worsening-M protein stable at 0.3 however kappa lambda light chain ratio rising kappa light chain 120.  Hemoglobin stable around 11 [CKD]; calcium normal; STABLE CKD-IV over all stable.  October 2021 skeletal survey-no obvious evidence of any lytic lesions concerning for progression.  Await cytogenetics.  We will also discuss at the tumor conference.  Given the relative stability of the lab parameters; also patient clinically stable-I think is reasonable to keep monitoring her closely.  Discussed that she would need treatment-if any organ dysfunction is noted; because of the rapid progression of M protein noted.  #Discussed with patient regarding available treatment options including-triple drug regimens-including daratumumab-based therapies; addition of IMIDs; along with steroids.  Again reviewed that we will recommend starting the treatment when above treatment criteria met.  We will watch her closely.   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 Candiss Norse.  Continue close monitoring.]   # RA- [on Plaquenil]- STABLE.  # COVID BOOSTER: Discussed given patient's diagnosis and other comorbidities/therapies-patient would be considered immunocompromised.  As per CDC recommendation/FDA approval-I would recommend booster vaccine.  Patient is interested.  Patient also awaiting the flu shot.   #DISPOSITION: will order 24 hours light chains- now.  # follow up in 3 months-MD;1 week prior labs- cbc/cmp; LDH; MM panel;  K/L light chains-  Dr.B  # 40 minutes face-to-face with the patient discussing the  above plan of care; more than 50% of time spent on prognosis/ natural history; counseling and coordination.   Cc; Dr.Hande/ Dr.Kernodle       Cammie Sickle, MD 03/05/2020 8:19 AM

## 2020-03-04 NOTE — Progress Notes (Signed)
Pt states she has had bronchitis x2 weeks. She is currently taking steroids and an antibiotic to clear it up. Still has a cough and some SOB.

## 2020-03-04 NOTE — Assessment & Plan Note (Addendum)
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  October 2021 labs worsening-M protein stable at 0.3 however kappa lambda light chain ratio rising kappa light chain 120.  Hemoglobin stable around 11 [CKD]; calcium normal; STABLE CKD-IV over all stable.  October 2021 skeletal survey-no obvious evidence of any lytic lesions concerning for progression.  Await cytogenetics.  We will also discuss at the tumor conference.  Given the relative stability of the lab parameters; also patient clinically stable-I think is reasonable to keep monitoring her closely.  Discussed that she would need treatment-if any organ dysfunction is noted; because of the rapid progression of M protein noted.  #Discussed with patient regarding available treatment options including-triple drug regimens-including daratumumab-based therapies; addition of IMIDs; along with steroids.  Again reviewed that we will recommend starting the treatment when above treatment criteria met.  We will watch her closely.   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 Candiss Norse.  Continue close monitoring.]   # RA- [on Plaquenil]- STABLE.  # COVID BOOSTER: Discussed given patient's diagnosis and other comorbidities/therapies-patient would be considered immunocompromised.  As per CDC recommendation/FDA approval-I would recommend booster vaccine.  Patient is interested.  Patient also awaiting the flu shot.   #DISPOSITION: will order 24 hours light chains- now.  # follow up in 3 months-MD;1 week prior labs- cbc/cmp; LDH; MM panel; K/L light chains-  Dr.B  # 40 minutes face-to-face with the patient discussing the above plan of care; more than 50% of time spent on prognosis/  natural history; counseling and coordination.   Cc; Dr.Hande/ Dr.Kernodle

## 2020-03-05 LAB — MULTIPLE MYELOMA PANEL, SERUM
Albumin SerPl Elph-Mcnc: 3.9 g/dL (ref 2.9–4.4)
Albumin/Glob SerPl: 1.4 (ref 0.7–1.7)
Alpha 1: 0.3 g/dL (ref 0.0–0.4)
Alpha2 Glob SerPl Elph-Mcnc: 1.1 g/dL — ABNORMAL HIGH (ref 0.4–1.0)
B-Globulin SerPl Elph-Mcnc: 0.9 g/dL (ref 0.7–1.3)
Gamma Glob SerPl Elph-Mcnc: 0.8 g/dL (ref 0.4–1.8)
Globulin, Total: 3 g/dL (ref 2.2–3.9)
IgA: 66 mg/dL (ref 64–422)
IgG (Immunoglobin G), Serum: 861 mg/dL (ref 586–1602)
IgM (Immunoglobulin M), Srm: 28 mg/dL (ref 26–217)
M Protein SerPl Elph-Mcnc: 0.3 g/dL — ABNORMAL HIGH
Total Protein ELP: 6.9 g/dL (ref 6.0–8.5)

## 2020-03-05 LAB — KAPPA/LAMBDA LIGHT CHAINS
Kappa free light chain: 63.1 mg/L — ABNORMAL HIGH (ref 3.3–19.4)
Kappa, lambda light chain ratio: 3.61 — ABNORMAL HIGH (ref 0.26–1.65)
Lambda free light chains: 17.5 mg/L (ref 5.7–26.3)

## 2020-03-05 NOTE — Addendum Note (Signed)
Addended by: Delice Bison E on: 03/05/2020 09:54 AM   Modules accepted: Orders

## 2020-03-06 ENCOUNTER — Encounter (HOSPITAL_COMMUNITY): Payer: Self-pay | Admitting: Internal Medicine

## 2020-03-06 LAB — SURGICAL PATHOLOGY

## 2020-03-07 ENCOUNTER — Telehealth: Payer: Self-pay | Admitting: Internal Medicine

## 2020-03-07 DIAGNOSIS — C9000 Multiple myeloma not having achieved remission: Secondary | ICD-10-CM

## 2020-03-07 NOTE — Telephone Encounter (Signed)
Spoke with pt in regards. Advised her to come in tomorrow to pick up specimen container from lab to collect the 24 hour urine. Pt expressed understanding. Explained to her to start on Sunday and bring back specimen on Monday at the 24 hour mark. No other questions or concerns expressed.

## 2020-03-07 NOTE — Telephone Encounter (Signed)
On 10/27- spoke to pt re: improved/stable K-l light chain in blood. Recommend repeating 24 hour urine testing.  I have placed the orders; please have her do the collection- starting Sunday; and have the sample dropped off on Monday [24 hours]. She can collect the jug for collection tomorrow- 10/29.  Thanks

## 2020-03-08 ENCOUNTER — Encounter (HOSPITAL_COMMUNITY): Payer: Self-pay | Admitting: Internal Medicine

## 2020-03-11 ENCOUNTER — Other Ambulatory Visit: Payer: Self-pay

## 2020-03-11 ENCOUNTER — Other Ambulatory Visit: Payer: Self-pay | Admitting: Internal Medicine

## 2020-03-11 DIAGNOSIS — C9000 Multiple myeloma not having achieved remission: Secondary | ICD-10-CM

## 2020-03-12 LAB — KAPPA/LAMBDA LIGHT CHAINS, FREE, WITH RATIO, 24HR. URINE
FR KAPPA LT CH,24HR: 105.44 mg/24 hr
FR LAMBDA LT CH,24HR: 10.45 mg/24 hr
Free Kappa Lt Chains,Ur: 95.85 mg/L (ref 0.63–113.79)
Free Kappa/Lambda Ratio: 10.09 (ref 1.03–31.76)
Free Lambda Lt Chains,Ur: 9.5 mg/L (ref 0.47–11.77)
Total Volume: 1100

## 2020-03-14 ENCOUNTER — Telehealth: Payer: Self-pay | Admitting: Internal Medicine

## 2020-03-14 DIAGNOSIS — C9000 Multiple myeloma not having achieved remission: Secondary | ICD-10-CM

## 2020-03-14 NOTE — Telephone Encounter (Signed)
On 11/02-spoke to patient regarding results of the urine kappa lambda light chain normal.  Recommend-repeating labs 1 week prior to next appointment in 3 months.

## 2020-04-02 ENCOUNTER — Telehealth: Payer: Self-pay | Admitting: Internal Medicine

## 2020-04-02 NOTE — Telephone Encounter (Signed)
On 11/23- Discussed at tumor conference/ with Dr.Manny at [facility] represents 10%; others 2% plasma cell-likely from patchy involvement by myeloma.  No sheets of plasma cells; bone marrow biopsy suggestive of early recurrence of myeloma.

## 2020-05-27 ENCOUNTER — Inpatient Hospital Stay: Payer: Medicare Other

## 2020-05-27 ENCOUNTER — Telehealth: Payer: Self-pay | Admitting: Internal Medicine

## 2020-05-27 NOTE — Telephone Encounter (Signed)
Pt called to notify that she had a COVID exposure on Friday 1/14 and has an appt tomorrow afternoon to be tested. Advised her to not come into her appointment today (1/17) and we would follow up with her to r/s.

## 2020-05-27 NOTE — Telephone Encounter (Signed)
Spoke with patient this morning as her husband is also our patient. Patient will get tested. Once we know the results, we can r/s her apts accordingly.

## 2020-05-28 ENCOUNTER — Other Ambulatory Visit: Payer: Medicare Other

## 2020-05-28 NOTE — Telephone Encounter (Signed)
Covid testing sch. 545 pm on 05/29/20

## 2020-05-29 ENCOUNTER — Other Ambulatory Visit: Payer: Self-pay

## 2020-05-29 ENCOUNTER — Other Ambulatory Visit: Payer: Medicare Other

## 2020-05-29 DIAGNOSIS — Z20822 Contact with and (suspected) exposure to covid-19: Secondary | ICD-10-CM

## 2020-05-31 LAB — SARS-COV-2, NAA 2 DAY TAT

## 2020-05-31 LAB — NOVEL CORONAVIRUS, NAA: SARS-CoV-2, NAA: NOT DETECTED

## 2020-05-31 NOTE — Telephone Encounter (Signed)
Covid testing negative.  - ok to r/s apts

## 2020-06-03 ENCOUNTER — Inpatient Hospital Stay: Payer: Medicare Other | Admitting: Internal Medicine

## 2020-06-21 ENCOUNTER — Inpatient Hospital Stay: Payer: Medicare Other | Attending: Internal Medicine

## 2020-06-21 ENCOUNTER — Telehealth: Payer: Self-pay | Admitting: *Deleted

## 2020-06-21 ENCOUNTER — Other Ambulatory Visit: Payer: Self-pay | Admitting: Internal Medicine

## 2020-06-21 ENCOUNTER — Other Ambulatory Visit: Payer: Self-pay

## 2020-06-21 DIAGNOSIS — Z881 Allergy status to other antibiotic agents status: Secondary | ICD-10-CM | POA: Diagnosis not present

## 2020-06-21 DIAGNOSIS — R5383 Other fatigue: Secondary | ICD-10-CM | POA: Diagnosis not present

## 2020-06-21 DIAGNOSIS — C679 Malignant neoplasm of bladder, unspecified: Secondary | ICD-10-CM | POA: Insufficient documentation

## 2020-06-21 DIAGNOSIS — M069 Rheumatoid arthritis, unspecified: Secondary | ICD-10-CM | POA: Insufficient documentation

## 2020-06-21 DIAGNOSIS — C9001 Multiple myeloma in remission: Secondary | ICD-10-CM | POA: Insufficient documentation

## 2020-06-21 DIAGNOSIS — M791 Myalgia, unspecified site: Secondary | ICD-10-CM | POA: Diagnosis not present

## 2020-06-21 DIAGNOSIS — C9000 Multiple myeloma not having achieved remission: Secondary | ICD-10-CM

## 2020-06-21 DIAGNOSIS — N184 Chronic kidney disease, stage 4 (severe): Secondary | ICD-10-CM | POA: Insufficient documentation

## 2020-06-21 DIAGNOSIS — C787 Secondary malignant neoplasm of liver and intrahepatic bile duct: Secondary | ICD-10-CM | POA: Insufficient documentation

## 2020-06-21 DIAGNOSIS — M549 Dorsalgia, unspecified: Secondary | ICD-10-CM | POA: Insufficient documentation

## 2020-06-21 DIAGNOSIS — Z79899 Other long term (current) drug therapy: Secondary | ICD-10-CM | POA: Diagnosis not present

## 2020-06-21 DIAGNOSIS — M2550 Pain in unspecified joint: Secondary | ICD-10-CM | POA: Insufficient documentation

## 2020-06-21 DIAGNOSIS — E875 Hyperkalemia: Secondary | ICD-10-CM | POA: Insufficient documentation

## 2020-06-21 LAB — COMPREHENSIVE METABOLIC PANEL
ALT: 11 U/L (ref 0–44)
AST: 28 U/L (ref 15–41)
Albumin: 4.3 g/dL (ref 3.5–5.0)
Alkaline Phosphatase: 62 U/L (ref 38–126)
Anion gap: 10 (ref 5–15)
BUN: 43 mg/dL — ABNORMAL HIGH (ref 8–23)
CO2: 20 mmol/L — ABNORMAL LOW (ref 22–32)
Calcium: 9.7 mg/dL (ref 8.9–10.3)
Chloride: 106 mmol/L (ref 98–111)
Creatinine, Ser: 2.65 mg/dL — ABNORMAL HIGH (ref 0.44–1.00)
GFR, Estimated: 18 mL/min — ABNORMAL LOW (ref >60)
Glucose, Bld: 109 mg/dL — ABNORMAL HIGH (ref 70–99)
Potassium: 5.6 mmol/L — ABNORMAL HIGH (ref 3.5–5.1)
Sodium: 136 mmol/L (ref 135–145)
Total Bilirubin: 0.5 mg/dL (ref 0.3–1.2)
Total Protein: 7.5 g/dL (ref 6.5–8.1)

## 2020-06-21 LAB — CBC WITH DIFFERENTIAL/PLATELET
Abs Immature Granulocytes: 0.11 10*3/uL — ABNORMAL HIGH (ref 0.00–0.07)
Basophils Absolute: 0 10*3/uL (ref 0.0–0.1)
Basophils Relative: 0 %
Eosinophils Absolute: 0 10*3/uL (ref 0.0–0.5)
Eosinophils Relative: 0 %
HCT: 33.9 % — ABNORMAL LOW (ref 36.0–46.0)
Hemoglobin: 11 g/dL — ABNORMAL LOW (ref 12.0–15.0)
Immature Granulocytes: 1 %
Lymphocytes Relative: 13 %
Lymphs Abs: 1.2 10*3/uL (ref 0.7–4.0)
MCH: 32.5 pg (ref 26.0–34.0)
MCHC: 32.4 g/dL (ref 30.0–36.0)
MCV: 100.3 fL — ABNORMAL HIGH (ref 80.0–100.0)
Monocytes Absolute: 0.3 10*3/uL (ref 0.1–1.0)
Monocytes Relative: 4 %
Neutro Abs: 7.4 10*3/uL (ref 1.7–7.7)
Neutrophils Relative %: 82 %
Platelets: 175 10*3/uL (ref 150–400)
RBC: 3.38 MIL/uL — ABNORMAL LOW (ref 3.87–5.11)
RDW: 13.2 % (ref 11.5–15.5)
WBC: 9.1 10*3/uL (ref 4.0–10.5)
nRBC: 0 % (ref 0.0–0.2)

## 2020-06-21 LAB — LACTATE DEHYDROGENASE: LDH: 198 U/L — ABNORMAL HIGH (ref 98–192)

## 2020-06-21 NOTE — Telephone Encounter (Signed)
Contacted patient. Made patient aware that her apts with Dr. B was changed to 2/21 due not having her lab results back by Monday. Pt gave verbal understanding.

## 2020-06-24 ENCOUNTER — Ambulatory Visit (INDEPENDENT_AMBULATORY_CARE_PROVIDER_SITE_OTHER): Payer: Medicare Other | Admitting: Urology

## 2020-06-24 ENCOUNTER — Other Ambulatory Visit: Payer: Self-pay

## 2020-06-24 ENCOUNTER — Encounter: Payer: Self-pay | Admitting: Urology

## 2020-06-24 ENCOUNTER — Inpatient Hospital Stay: Payer: Medicare Other | Admitting: Internal Medicine

## 2020-06-24 VITALS — BP 155/74 | HR 71 | Ht 67.0 in | Wt 171.0 lb

## 2020-06-24 DIAGNOSIS — N3946 Mixed incontinence: Secondary | ICD-10-CM | POA: Diagnosis not present

## 2020-06-24 LAB — MULTIPLE MYELOMA PANEL, SERUM
Albumin SerPl Elph-Mcnc: 3.9 g/dL (ref 2.9–4.4)
Albumin/Glob SerPl: 1.4 (ref 0.7–1.7)
Alpha 1: 0.2 g/dL (ref 0.0–0.4)
Alpha2 Glob SerPl Elph-Mcnc: 0.9 g/dL (ref 0.4–1.0)
B-Globulin SerPl Elph-Mcnc: 0.9 g/dL (ref 0.7–1.3)
Gamma Glob SerPl Elph-Mcnc: 0.8 g/dL (ref 0.4–1.8)
Globulin, Total: 2.8 g/dL (ref 2.2–3.9)
IgA: 63 mg/dL — ABNORMAL LOW (ref 64–422)
IgG (Immunoglobin G), Serum: 824 mg/dL (ref 586–1602)
IgM (Immunoglobulin M), Srm: 11 mg/dL — ABNORMAL LOW (ref 26–217)
M Protein SerPl Elph-Mcnc: 0.4 g/dL — ABNORMAL HIGH
Total Protein ELP: 6.7 g/dL (ref 6.0–8.5)

## 2020-06-24 LAB — KAPPA/LAMBDA LIGHT CHAINS
Kappa free light chain: 75.8 mg/L — ABNORMAL HIGH (ref 3.3–19.4)
Kappa, lambda light chain ratio: 3.81 — ABNORMAL HIGH (ref 0.26–1.65)
Lambda free light chains: 19.9 mg/L (ref 5.7–26.3)

## 2020-06-24 LAB — BLADDER SCAN AMB NON-IMAGING

## 2020-06-24 LAB — URINALYSIS, COMPLETE
Bilirubin, UA: NEGATIVE
Glucose, UA: NEGATIVE
Ketones, UA: NEGATIVE
Nitrite, UA: NEGATIVE
Protein,UA: NEGATIVE
RBC, UA: NEGATIVE
Specific Gravity, UA: 1.025 (ref 1.005–1.030)
Urobilinogen, Ur: 0.2 mg/dL (ref 0.2–1.0)
pH, UA: 6 (ref 5.0–7.5)

## 2020-06-24 LAB — MICROSCOPIC EXAMINATION

## 2020-06-24 NOTE — Progress Notes (Signed)
06/24/2020 11:17 AM   Sydney Gillespie 1940/08/13 [id number]  Referring provider: [name], [facility] [place],  [place] [address]  Chief Complaint  Patient presents with  . Urinary Incontinence    HPI: She leaks with coughing sneezing bending and lifting. She has urge incontinence. Both are significant. She says her main complaint is when she goes from a sitting to standing position the urine just comes out. She wears 3 or 4 pads a day that can be soaked. She has moderately severe bedwetting.  Mild hypermobility of the bladder neck and a mild positive cough test with no prolapse  The patient has mixed incontinence. I think both likely are significant. I wonder if the overactive bladder component is the most significant. She does have milder to moderate frequency and nocturia. She also has moderately severe bedwetting. She likely is triggering overactivity when she goes from a sitting to standing position but she does have reasonably significant stress incontinence.If the patient ever did have her stress incontinence treated urethral injectables or sling would be reasonable  On urodynamics the patient did not void and was catheterized for 75 mL. Bladder capacity was 220 mL. The patient hadsensoryurgency. The bladder was unstable reaching pressures of 10 cm of water associated with severe leakage. She was triggering a lot of overactivity when she would cough. It made assessment of stress incontinence more difficult.At150 mL her cough leak point pressure is 58 cm of water associated with mild to moderate leakage. There was a lot of triggering. During voluntary voiding she voided 57 mL with a maximum flow of 8 mils per second and a residual was 100 mL. Bladder neck descent at 1 cm. The leakage was severe with her bladder overactivity. It was moderate in severity with stress maneuvers.  In my opinion approximately 80% of the  patient's bladder dysfunction is due to an overactive bladder. Medical and behavioral therapy discussed including physical therapy. If she does not reach her goal one would consider an outlet procedure but I would want to re-quantitate her goals and stress incontinence recognizing that arefractory overactive bladder therapy may be prudent to consider instead, especially with her bedwetting and triggering with standing.Reassess on Myrbetriq in 5 weeks  Patient on Myrbetriq has improved and now wears 2 pads a day instead of 3 or 4.   Would like to see her in 5 weeks on Toviaz 8 mg samples and Myrbetriq 50 mg. This is double therapy. I will take away the Myrbetriq if she is doing great. I will discuss surgery versus refractory therapies versus a trial of another antimuscarinic as add on double therapy with Myrbetriqnext time.  The patient's mixed incontinence is 80% better. At night she is dramatically better and only wears a liner. When the Myrbetriq samples stopped she started leaking a lot again high volume. She is very pleased. She can live with a stress incontinence.  Patient still leaks with activity and there was one high-volume episode this is uncommon. She said the stress incontinence is worse in the residual urge incontinence. She still wears 1 and sometimes 2 pads a day and agrees overall for her mixed incontinence she is about 80% better and reasonably pleased.  She was wondering what a sling in detail. I drew her a picture. I went through my entire template in detail. We talked about mesh issues. We talked about overactive bladder concerns with persistent or worsening symptoms.  Mixed incontinence. Patient now on oxybutynin and Myrbetriq.  I will see her in 1 year. She will call if she like to schedule surgery. She is leaning towards not doing surgery  Today Frequency stable.  Doing excellent on oxybutynin with very little urgency incontinence.  Clinically no  infections.  [id number] sent to pharmacy and see near  Today Patient is urge incontinence with triggers going from sitting to standing position of worsen last few months but she is going through a lot of stress.  Her multiple myeloma came back but they are treating it with watchful waiting.  Unfortunately her husband over 3 or 4 months.  Was diagnosed with metastatic bladder cancer treated in this office.  Clinically not infected.     PMH: Past Medical History:  Diagnosis Date  . Allergic rhinitis   . Allergy   . Anemia   . Anemia   . Barrett's esophagus   . Blood dyscrasia    multiple myloma remission  . Cancer ([place])   . Change in bowel habits   . Compression fracture 2013  . Degenerative disc disease, lumbar   . Dysrhythmia    palpitations  . Esophageal reflux   . Family history of adverse reaction to anesthesia    nausea -mom  . GERD (gastroesophageal reflux disease)   . Gout   . 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[place])   . Heart palpitations   . Hyperlipidemia   . Hypothyroidism   . Inflammatory polyarthropathy ([place])   . Lumbago   . Lumbar radiculitis   . Lumbar stenosis with neurogenic claudication   . Multiple myeloma ([place])   . Multiple myeloma ([place])   . Osteopenia   . Osteoporosis   . Other dysphagia   . Palpitations   . Positive PPD   . Renal insufficiency   . Rheumatoid arthritis ([place])   . Rheumatoid arthritis ([place])   . Seasonal allergies   . Vertigo     Surgical History: Past Surgical History:  Procedure Laterality Date  . ABDOMINAL HYSTERECTOMY    . APPENDECTOMY    . BACK SURGERY    . BREAST EXCISIONAL BIOPSY Bilateral    neg  . CHOLECYSTECTOMY    . COLONOSCOPY WITH PROPOFOL N/A 11/12/2016   Procedure: COLONOSCOPY WITH PROPOFOL;  Surgeon: Manya Silvas, MD;  Location[facility] ENDOSCOPY;  Service: Endoscopy;  Laterality: N/A;  . ESOPHAGOGASTRODUODENOSCOPY (EGD) WITH PROPOFOL N/A 11/12/2016   Procedure: ESOPHAGOGASTRODUODENOSCOPY (EGD) WITH PROPOFOL;  Surgeon:  Manya Silvas, MD;  Location: [facility] ENDOSCOPY;  Service: Endoscopy;  Laterality: N/A;  . FOOT SURGERY    . LUMBAR LAMINECTOMY/DECOMPRESSION MICRODISCECTOMY Left 06/18/2016   Procedure: LAMINECTOMY FOR FACET/SYNOVIAL CYST LUMBAR THREE - LUMBAR FOUR LEFT;  Surgeon: Newman Pies, MD;  Location: [facility];  Service: Neurosurgery;  Laterality: Left;  LAMINECTOMY FOR FACET/SYNOVIAL CYST LUMBAR THREE - LUMBAR FOUR LEFT    Home Medications:  Allergies as of 06/24/2020      Reactions   Isoniazid Other (See Comments)   Blisters    Keflex [cephalexin] Other (See Comments)   Blisters      Medication List       Accurate as of June 24, 2020 11:17 AM. If you have any questions, ask your nurse or doctor.        acetaminophen 325 MG tablet Commonly known as: TYLENOL Take 650 mg by mouth every 6 (six) hours as needed for fever or headache.   albuterol 108 (90 Base) MCG/ACT inhaler Commonly known as: VENTOLIN HFA Inhale into the lungs.   allopurinol 300 MG  tablet Commonly known as: ZYLOPRIM Take 300 mg by mouth daily.   ALPRAZolam 0.25 MG tablet Commonly known as: XANAX Take 0.25 mg by mouth at bedtime.   atenolol 50 MG tablet Commonly known as: TENORMIN Take 0.5 tablets (25 mg total) by mouth daily.   cetirizine 10 MG tablet Commonly known as: ZYRTEC Take 10 mg by mouth 2 (two) times daily.   DULoxetine 20 MG capsule Commonly known as: CYMBALTA Take 20 mg by mouth daily.   fluticasone 50 MCG/ACT nasal spray Commonly known as: FLONASE Place 2 sprays into the nose daily.   hydroxychloroquine 200 MG tablet Commonly known as: PLAQUENIL TAKE 2 TABLETS BY MOUTH EVERY DAY   lansoprazole 30 MG capsule Commonly known as: PREVACID Take 30 mg by mouth daily at 12 noon.   leflunomide 10 MG tablet Commonly known as: ARAVA Take 1 tablet (10 mg total) by mouth daily.   levothyroxine 75 MCG tablet Commonly known as: SYNTHROID Take 75 mcg by mouth daily before breakfast.    ondansetron 8 MG tablet Commonly known as: ZOFRAN One pill every 8 hours as needed for nausea/vomitting.   oxybutynin 10 MG 24 hr tablet Commonly known as: DITROPAN-XL Take 1 tablet (10 mg total) by mouth daily.   predniSONE 10 MG tablet Commonly known as: DELTASONE Take by mouth.   rOPINIRole 0.25 MG tablet Commonly known as: REQUIP Take 0.25 mg by mouth at bedtime.   spironolactone 25 MG tablet Commonly known as: ALDACTONE Take 25 mg by mouth daily.   triamterene-hydrochlorothiazide 37.5-25 MG tablet Commonly known as: MAXZIDE-25 TK 1 T PO PRN   vitamin B-12 1000 MCG tablet Commonly known as: CYANOCOBALAMIN Take 1,000 mcg by mouth daily.       Allergies:  Allergies  Allergen Reactions  . Isoniazid Other (See Comments)    Blisters   . Keflex [Cephalexin] Other (See Comments)    Blisters     Family History: Family History  Problem Relation Age of Onset  . Hypertension Mother   . Heart attack Mother   . Rheum arthritis Mother   . Osteoarthritis Mother   . Cancer Father   . Hypertension Father   . Stroke Father   . Breast cancer Maternal Grandmother 82  . Osteoarthritis Other   . Rheum arthritis Other     Social History:  reports that she has never smoked. She has never used smokeless tobacco. She reports that she does not drink alcohol and does not use drugs.  ROS:                                        Physical Exam: BP (!) 155/74   Pulse 71   Ht '5\' 7"'  (1.702 m)   Wt 77.6 kg   BMI 26.78 kg/m   Constitutional:  Alert and oriented, No acute distress. HEENT: [place] AT, moist mucus membranes.  Trachea midline, no masses.  Laboratory Data: Lab Results  Component Value Date   WBC 9.1 06/21/2020   HGB 11.0 (L) 06/21/2020   HCT 33.9 (L) 06/21/2020   MCV 100.3 (H) 06/21/2020   PLT 175 06/21/2020    Lab Results  Component Value Date   CREATININE 2.65 (H) 06/21/2020    No results found for: PSA  No results found for:  TESTOSTERONE  No results found for: HGBA1C  Urinalysis    Component Value Date/Time   APPEARANCEUR Clear 07/12/2017  [place] Negative 07/12/2017 1407   BILIRUBINUR Negative 07/12/2017 1407   PROTEINUR Negative 07/12/2017 1407   NITRITE Negative 07/12/2017 1407   LEUKOCYTESUR Negative 07/12/2017 1407    Pertinent Imaging:   Assessment & Plan: I sent the urine for culture.  I gave her 4 months of Myrbetriq samples and she will take with the oxybutynin.  She was having trouble to afford it.  She understands the stress could have made things worse and certainly she should not have more invasive treatments at this stage.  I am concerned in the future perform the sling with her about overactive bladder.  She describes sitting in a chair and leaking a large amount in the emergency room which would be due to t overactivity.  Call if urine culture is positive  1. Mixed incontinence  - Urinalysis, Complete - Bladder Scan (Post Void Residual) in office   No follow-ups on file.  Reece Packer, MD  [facility] [address], [place], [place] [address] [phone]

## 2020-06-24 NOTE — Addendum Note (Signed)
Addended by: Alvera Novel on: 06/24/2020 02:04 PM   Modules accepted: Orders

## 2020-06-26 ENCOUNTER — Telehealth: Payer: Self-pay | Admitting: Internal Medicine

## 2020-06-26 DIAGNOSIS — C679 Malignant neoplasm of bladder, unspecified: Secondary | ICD-10-CM | POA: Diagnosis not present

## 2020-06-26 NOTE — Telephone Encounter (Signed)
On 2/14-I called patient; unable to reach. Left message for her to call us back to discuss results of her blood work.

## 2020-06-28 ENCOUNTER — Telehealth: Payer: Self-pay | Admitting: Internal Medicine

## 2020-06-28 ENCOUNTER — Other Ambulatory Visit: Payer: Self-pay

## 2020-06-28 DIAGNOSIS — C9000 Multiple myeloma not having achieved remission: Secondary | ICD-10-CM

## 2020-06-28 NOTE — Addendum Note (Signed)
Addended by: Gloris Ham on: 06/28/2020 08:31 AM   Modules accepted: Orders

## 2020-06-28 NOTE — Telephone Encounter (Signed)
Met b added per md order

## 2020-06-28 NOTE — Telephone Encounter (Signed)
On 2/17-I called patient spoke to her regarding her recent labs elevated potassium. Patient has followed up with nephrology recently and states her elevated potassium was addressed.  However recommend keep appointment as planned on 2/21.   C-ALSO add lab encounter; order BMP only.

## 2020-06-29 LAB — KAPPA/LAMBDA LIGHT CHAINS, FREE, WITH RATIO, 24HR. URINE
FR KAPPA LT CH,24HR: 29.16 mg/24 hr
FR LAMBDA LT CH,24HR: 3.68 mg/24 hr
Free Kappa Lt Chains,Ur: 19.44 mg/L (ref 0.63–113.79)
Free Kappa/Lambda Ratio: 7.93 (ref 1.03–31.76)
Free Lambda Lt Chains,Ur: 2.45 mg/L (ref 0.47–11.77)
Total Volume: 1500

## 2020-07-01 ENCOUNTER — Inpatient Hospital Stay (HOSPITAL_BASED_OUTPATIENT_CLINIC_OR_DEPARTMENT_OTHER): Payer: Medicare Other | Admitting: Internal Medicine

## 2020-07-01 ENCOUNTER — Encounter: Payer: Self-pay | Admitting: Internal Medicine

## 2020-07-01 ENCOUNTER — Inpatient Hospital Stay: Payer: Medicare Other

## 2020-07-01 DIAGNOSIS — C9000 Multiple myeloma not having achieved remission: Secondary | ICD-10-CM

## 2020-07-01 DIAGNOSIS — C679 Malignant neoplasm of bladder, unspecified: Secondary | ICD-10-CM | POA: Diagnosis not present

## 2020-07-01 LAB — BASIC METABOLIC PANEL
Anion gap: 12 (ref 5–15)
BUN: 41 mg/dL — ABNORMAL HIGH (ref 8–23)
CO2: 24 mmol/L (ref 22–32)
Calcium: 9.3 mg/dL (ref 8.9–10.3)
Chloride: 101 mmol/L (ref 98–111)
Creatinine, Ser: 2.47 mg/dL — ABNORMAL HIGH (ref 0.44–1.00)
GFR, Estimated: 19 mL/min — ABNORMAL LOW (ref >60)
Glucose, Bld: 111 mg/dL — ABNORMAL HIGH (ref 70–99)
Potassium: 5.2 mmol/L — ABNORMAL HIGH (ref 3.5–5.1)
Sodium: 137 mmol/L (ref 135–145)

## 2020-07-01 NOTE — Assessment & Plan Note (Addendum)
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   #M protein 0.4; kappa lambda light chain ratio slightly abnormal; overall stable renal function  #Continue monitoring at this time given patient's social issues [husband under hospice]; and also relative stability of her renal function.  Recommend monitoring myeloma panel closely.   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 Candiss Norse.  Continue close monitoring.]   #Hyperkalemia potassium 5.6; today 5.2.  Monitor for now.  Off Aldactone  # RA- [on Plaquenil]- STABLE.  #DISPOSITION:  # follow up in 3 months-MD;1 week prior labs- cbc/cmp; LDH; MM panel; K/L light chains-  Dr.B    Cc; Dr.Hande/ Dr.Kernodle

## 2020-07-01 NOTE — Progress Notes (Signed)
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  No evidence of organ dysfunction; continue close surveillance.  # CKD [~ creat 1.35- 1.7] ; Dr.Singh  # 2012- LEFT KIDNEY ? 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 benign   Multiple myeloma not having achieved remission ([place])  12/18/2015 Initial Diagnosis   Multiple myeloma in remission [facility])          INTERVAL HISTORY: This is a pleasant 80 year-old female patient with above history of multiple Myeloma [status post autologous stem cell transplant May 2013 patient denies any] currently on surveillance/no maintenance therapy is here for follow-up.  In the interim patient has been evaluated by nephrology.  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  Is currently under hospice given poor tolerance to therapy/progressive disease.  Patient certainly has been under stress given her husband illness.  She admits to fatigue.  Otherwise denies any worsening back pain or tingling or numbness.   Review of Systems  Constitutional: Positive for malaise/fatigue. Negative for diaphoresis, fever and weight loss.       Cold intolerance.  HENT: Negative for nosebleeds and sore throat.   Eyes: Negative for double vision.  Respiratory: Negative for  cough, hemoptysis, sputum production, shortness of breath and wheezing.   Cardiovascular: Negative for chest pain, palpitations, orthopnea and leg swelling.  Gastrointestinal: Negative for abdominal pain, blood in stool, constipation, diarrhea, heartburn, melena, nausea and vomiting.  Genitourinary: Negative for dysuria, frequency and urgency.  Musculoskeletal: Positive for back pain, joint pain and myalgias.  Skin: Negative.  Negative for itching and rash.  Neurological: Positive for tingling. Negative for dizziness, focal weakness, weakness and headaches.  Endo/Heme/Allergies: Does not bruise/bleed easily.  Psychiatric/Behavioral: Negative for depression. The patient is not nervous/anxious and does not have insomnia.     ALLERGIES:  is allergic to isoniazid and keflex [cephalexin].  MEDICATIONS:  Current Outpatient Medications  Medication Sig Dispense Refill  . allopurinol (ZYLOPRIM) 300 MG tablet Take 300 mg by mouth daily.     Marland Kitchen ALPRAZolam (XANAX) 0.25 MG tablet Take 0.25 mg by mouth at bedtime.   0  . atenolol (TENORMIN) 50 MG tablet Take 0.5 tablets (25 mg total) by mouth daily. 30 tablet 0  . cetirizine (ZYRTEC) 10 MG tablet Take 10 mg by mouth 2 (two) times daily.     . fluticasone (FLONASE) 50 MCG/ACT nasal spray Place 2 sprays into the nose daily.     . hydroxychloroquine (PLAQUENIL) 200 MG tablet TAKE 2 TABLETS BY MOUTH EVERY DAY    . lansoprazole (PREVACID) 30 MG capsule Take 30 mg by mouth daily at 12 noon.    Marland Kitchen levothyroxine (SYNTHROID, LEVOTHROID) 75 MCG tablet Take 75 mcg by mouth daily before breakfast.   5  . ondansetron (ZOFRAN) 8 MG tablet One pill every 8 hours as needed for nausea/vomitting. [place]  tablet 1  . oxybutynin (DITROPAN-XL) 10 MG 24 hr tablet Take 1 tablet (10 mg total) by mouth daily. 30 tablet 11  . predniSONE (DELTASONE) 10 MG tablet Take by mouth.    Marland Kitchen rOPINIRole (REQUIP) 0.25 MG tablet Take 0.25 mg by mouth at bedtime.    . triamterene-hydrochlorothiazide  (MAXZIDE-25) 37.5-25 MG tablet TK 1 T PO PRN    . vitamin B-12 (CYANOCOBALAMIN) 1000 MCG tablet Take 1,000 mcg by mouth daily.    Marland Kitchen acetaminophen (TYLENOL) 325 MG tablet Take 650 mg by mouth every 6 (six) hours as needed for fever or headache. (Patient not taking: No sig reported)    . albuterol (VENTOLIN HFA) 108 (90 Base) MCG/ACT inhaler Inhale into the lungs. (Patient not taking: No sig reported)     No current facility-administered medications for this visit.    PHYSICAL EXAMINATION: ECOG PERFORMANCE STATUS: 0 - Asymptomatic  BP 135/69 (BP Location: Left Arm, Patient Position: Sitting, Cuff Size: Normal)   Pulse 71   Temp 97.9 F (36.6 C) (Tympanic)   Resp 16   Ht '5\' 7"'  (1.702 m)   Wt 171 lb (77.6 kg)   SpO2 100%   BMI 26.78 kg/m   Filed Weights   07/01/20 1518  Weight: 171 lb (77.6 kg)    Physical Exam HENT:     Head: Normocephalic and atraumatic.     Mouth/Throat:     Pharynx: No oropharyngeal exudate.  Eyes:     Pupils: Pupils are equal, round, and reactive to light.  Cardiovascular:     Rate and Rhythm: Normal rate and regular rhythm.  Pulmonary:     Effort: No respiratory distress.     Breath sounds: No wheezing.  Abdominal:     General: Bowel sounds are normal. There is no distension.     Palpations: Abdomen is soft. There is no mass.     Tenderness: There is no abdominal tenderness. There is no guarding or rebound.  Musculoskeletal:        General: No tenderness. Normal range of motion.     Cervical back: Normal range of motion and neck supple.  Skin:    General: Skin is warm.  Neurological:     Mental Status: She is alert and oriented to person, place, and time.  Psychiatric:        Mood and Affect: Affect normal.     LABORATORY DATA:  I have reviewed the data as listed    Component Value Date/Time   NA 137 07/01/2020 1459   NA 142 08/16/2013 1028   K 5.2 (H) 07/01/2020 1459   K 4.5 08/16/2013 1028   CL 101 07/01/2020 1459   CL 106  08/16/2013 1028   CO2 24 07/01/2020 1459   CO2 27 08/16/2013 1028   GLUCOSE 111 (H) 07/01/2020 1459   GLUCOSE 99 08/16/2013 1028   BUN 41 (H) 07/01/2020 1459   BUN 21 (H) 08/16/2013 1028   CREATININE 2.47 (H) 07/01/2020 1459   CREATININE 1.42 (H) 09/07/2014 0954   CALCIUM 9.3 07/01/2020 1459   CALCIUM 9.3 09/07/2014 0954   PROT 7.5 06/21/2020 1441   PROT 7.4 08/16/2013 1028   ALBUMIN 4.3 06/21/2020 1441   ALBUMIN 3.9 08/16/2013 1028   AST 28 06/21/2020 1441   AST 27 08/16/2013 1028   ALT 11 06/21/2020 1441   ALT 15 08/16/2013 1028   ALKPHOS 62 06/21/2020 1441   ALKPHOS 61 08/16/2013 1028   BILITOT 0.5 06/21/2020 1441   BILITOT 0.3  08/16/2013 1028   GFRNONAA 19 (L) 07/01/2020 1459   GFRNONAA 36 (L) 09/07/2014 0954   GFRAA 19 (L) 02/09/2020 1001   GFRAA 42 (L) 09/07/2014 0954    No results found for: SPEP, UPEP  Lab Results  Component Value Date   WBC 9.1 06/21/2020   NEUTROABS 7.4 06/21/2020   HGB 11.0 (L) 06/21/2020   HCT 33.9 (L) 06/21/2020   MCV 100.3 (H) 06/21/2020   PLT 175 06/21/2020      Chemistry      Component Value Date/Time   NA 137 07/01/2020 1459   NA 142 08/16/2013 1028   K 5.2 (H) 07/01/2020 1459   K 4.5 08/16/2013 1028   CL 101 07/01/2020 1459   CL 106 08/16/2013 1028   CO2 24 07/01/2020 1459   CO2 27 08/16/2013 1028   BUN 41 (H) 07/01/2020 1459   BUN 21 (H) 08/16/2013 1028   CREATININE 2.47 (H) 07/01/2020 1459   CREATININE 1.42 (H) 09/07/2014 0954      Component Value Date/Time   CALCIUM 9.3 07/01/2020 1459   CALCIUM 9.3 09/07/2014 0954   ALKPHOS 62 06/21/2020 1441   ALKPHOS 61 08/16/2013 1028   AST 28 06/21/2020 1441   AST 27 08/16/2013 1028   ALT 11 06/21/2020 1441   ALT 15 08/16/2013 1028   BILITOT 0.5 06/21/2020 1441   BILITOT 0.3 08/16/2013 1028       RADIOGRAPHIC STUDIES: I have personally reviewed the radiological images as listed and agreed with the findings in the report. No results found.   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   #M protein 0.4; kappa lambda light chain ratio slightly abnormal; overall stable renal function  #Continue monitoring at this time given patient's social issues [husband under hospice]; and also relative stability of her renal function.  Recommend monitoring myeloma panel closely.   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 Candiss Norse.  Continue close monitoring.]   #Hyperkalemia potassium 5.6; today 5.2.  Monitor for now.  Off Aldactone  # RA- [on Plaquenil]- STABLE.  #DISPOSITION:  # follow up in 3 months-MD;1 week prior labs- cbc/cmp; LDH; MM panel; K/L light chains-  Dr.B    Cc; Dr.Hande/ Dr.Kernodle       Cammie Sickle, MD 07/01/2020 4:43 PM

## 2020-07-31 ENCOUNTER — Encounter: Payer: Self-pay | Admitting: Internal Medicine

## 2020-09-10 ENCOUNTER — Other Ambulatory Visit: Payer: Self-pay | Admitting: Neurology

## 2020-09-10 DIAGNOSIS — M79604 Pain in right leg: Secondary | ICD-10-CM

## 2020-09-18 ENCOUNTER — Other Ambulatory Visit: Payer: Self-pay

## 2020-09-18 ENCOUNTER — Ambulatory Visit
Admission: RE | Admit: 2020-09-18 | Discharge: 2020-09-18 | Disposition: A | Discharge status: Home / Self Care | Payer: Medicare Other | Source: Ambulatory Visit | Attending: Neurology | Admitting: Neurology

## 2020-09-18 DIAGNOSIS — M79604 Pain in right leg: Secondary | ICD-10-CM | POA: Diagnosis present

## 2020-09-30 ENCOUNTER — Inpatient Hospital Stay (HOSPITAL_BASED_OUTPATIENT_CLINIC_OR_DEPARTMENT_OTHER): Payer: Medicare Other | Admitting: Internal Medicine

## 2020-09-30 ENCOUNTER — Inpatient Hospital Stay: Payer: Medicare Other | Attending: Internal Medicine

## 2020-09-30 DIAGNOSIS — G8929 Other chronic pain: Secondary | ICD-10-CM | POA: Diagnosis not present

## 2020-09-30 DIAGNOSIS — M549 Dorsalgia, unspecified: Secondary | ICD-10-CM | POA: Diagnosis not present

## 2020-09-30 DIAGNOSIS — M069 Rheumatoid arthritis, unspecified: Secondary | ICD-10-CM | POA: Insufficient documentation

## 2020-09-30 DIAGNOSIS — C9000 Multiple myeloma not having achieved remission: Secondary | ICD-10-CM

## 2020-09-30 DIAGNOSIS — E875 Hyperkalemia: Secondary | ICD-10-CM | POA: Insufficient documentation

## 2020-09-30 DIAGNOSIS — Z79899 Other long term (current) drug therapy: Secondary | ICD-10-CM | POA: Diagnosis not present

## 2020-09-30 DIAGNOSIS — Z634 Disappearance and death of family member: Secondary | ICD-10-CM | POA: Diagnosis not present

## 2020-09-30 DIAGNOSIS — Z881 Allergy status to other antibiotic agents status: Secondary | ICD-10-CM | POA: Diagnosis not present

## 2020-09-30 DIAGNOSIS — N189 Chronic kidney disease, unspecified: Secondary | ICD-10-CM | POA: Insufficient documentation

## 2020-09-30 DIAGNOSIS — R5383 Other fatigue: Secondary | ICD-10-CM | POA: Diagnosis not present

## 2020-09-30 DIAGNOSIS — M791 Myalgia, unspecified site: Secondary | ICD-10-CM | POA: Diagnosis not present

## 2020-09-30 DIAGNOSIS — R200 Anesthesia of skin: Secondary | ICD-10-CM | POA: Diagnosis not present

## 2020-09-30 DIAGNOSIS — C9001 Multiple myeloma in remission: Secondary | ICD-10-CM | POA: Diagnosis present

## 2020-09-30 DIAGNOSIS — M2550 Pain in unspecified joint: Secondary | ICD-10-CM | POA: Insufficient documentation

## 2020-09-30 LAB — CBC WITH DIFFERENTIAL/PLATELET
Abs Immature Granulocytes: 0.03 10*3/uL (ref 0.00–0.07)
Basophils Absolute: 0 10*3/uL (ref 0.0–0.1)
Basophils Relative: 1 %
Eosinophils Absolute: 0.1 10*3/uL (ref 0.0–0.5)
Eosinophils Relative: 2 %
HCT: 33.1 % — ABNORMAL LOW (ref 36.0–46.0)
Hemoglobin: 10.7 g/dL — ABNORMAL LOW (ref 12.0–15.0)
Immature Granulocytes: 0 %
Lymphocytes Relative: 26 %
Lymphs Abs: 1.8 10*3/uL (ref 0.7–4.0)
MCH: 31.7 pg (ref 26.0–34.0)
MCHC: 32.3 g/dL (ref 30.0–36.0)
MCV: 97.9 fL (ref 80.0–100.0)
Monocytes Absolute: 0.5 10*3/uL (ref 0.1–1.0)
Monocytes Relative: 8 %
Neutro Abs: 4.3 10*3/uL (ref 1.7–7.7)
Neutrophils Relative %: 63 %
Platelets: 168 10*3/uL (ref 150–400)
RBC: 3.38 MIL/uL — ABNORMAL LOW (ref 3.87–5.11)
RDW: 13.1 % (ref 11.5–15.5)
WBC: 6.8 10*3/uL (ref 4.0–10.5)
nRBC: 0 % (ref 0.0–0.2)

## 2020-09-30 LAB — BASIC METABOLIC PANEL
Anion gap: 11 (ref 5–15)
BUN: 43 mg/dL — ABNORMAL HIGH (ref 8–23)
CO2: 24 mmol/L (ref 22–32)
Calcium: 9.6 mg/dL (ref 8.9–10.3)
Chloride: 101 mmol/L (ref 98–111)
Creatinine, Ser: 2.43 mg/dL — ABNORMAL HIGH (ref 0.44–1.00)
GFR, Estimated: 20 mL/min — ABNORMAL LOW (ref >60)
Glucose, Bld: 95 mg/dL (ref 70–99)
Potassium: 4.5 mmol/L (ref 3.5–5.1)
Sodium: 136 mmol/L (ref 135–145)

## 2020-09-30 NOTE — Progress Notes (Signed)
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  No evidence of organ dysfunction; continue close surveillance.  # CKD [~ creat 1.35- 1.7] ; Dr.Singh  # 2012- LEFT KIDNEY ? 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 benign   Multiple myeloma not having achieved remission ([place])  12/18/2015 Initial Diagnosis   Multiple myeloma in remission [facility])          INTERVAL HISTORY: This is a pleasant 80 year-old female patient with above history of multiple Myeloma [status post autologous stem cell transplant May 2013 patient denies any] currently on surveillance/no maintenance therapy is here for follow-up.  In the interim patient's husband died of small cell cancer of the bladder/metastatic to liver.  Patient coping fairly well; however still going to the grieving process.  Complains of numbness of the right foot intermittent.  She has been evaluated by neurology; Dr. Melrose Nakayama.  Ultrasound negative for DVT.  Denies any worsening back pain.  Otherwise no chest pain or shortness of breath or cough.  Review of Systems  Constitutional: Positive for malaise/fatigue. Negative for diaphoresis, fever and weight loss.       Cold intolerance.  HENT: Negative for nosebleeds and sore throat.   Eyes: Negative for double vision.  Respiratory: Negative for cough,  hemoptysis, sputum production, shortness of breath and wheezing.   Cardiovascular: Negative for chest pain, palpitations, orthopnea and leg swelling.  Gastrointestinal: Negative for abdominal pain, blood in stool, constipation, diarrhea, heartburn, melena, nausea and vomiting.  Genitourinary: Negative for dysuria, frequency and urgency.  Musculoskeletal: Positive for back pain, joint pain and myalgias.  Skin: Negative.  Negative for itching and rash.  Neurological: Positive for tingling. Negative for dizziness, focal weakness, weakness and headaches.  Endo/Heme/Allergies: Does not bruise/bleed easily.  Psychiatric/Behavioral: Negative for depression. The patient is not nervous/anxious and does not have insomnia.     ALLERGIES:  is allergic to isoniazid and keflex [cephalexin].  MEDICATIONS:  Current Outpatient Medications  Medication Sig Dispense Refill  . allopurinol (ZYLOPRIM) 300 MG tablet Take 300 mg by mouth daily.     Marland Kitchen atenolol (TENORMIN) 50 MG tablet Take 0.5 tablets (25 mg total) by mouth daily. 30 tablet 0  . cetirizine (ZYRTEC) 10 MG tablet Take 10 mg by mouth 2 (two) times daily.     . fluticasone (FLONASE) 50 MCG/ACT nasal spray Place 2 sprays into the nose daily.     . hydroxychloroquine (PLAQUENIL) 200 MG tablet TAKE 2 TABLETS BY MOUTH EVERY DAY    . lansoprazole (PREVACID) 30 MG capsule Take 30 mg by mouth daily at 12 noon.    Marland Kitchen levothyroxine (SYNTHROID, LEVOTHROID) 75 MCG tablet Take 75 mcg by mouth daily before breakfast.   5  . mirabegron ER (MYRBETRIQ) 50 MG TB24 tablet Take by mouth.    . ondansetron (ZOFRAN) 8 MG tablet One pill every 8 hours as needed for nausea/vomitting. 40 tablet 1  .  oxybutynin (DITROPAN-XL) 10 MG 24 hr tablet Take 1 tablet (10 mg total) by mouth daily. 30 tablet 11  . rOPINIRole (REQUIP) 0.25 MG tablet Take 0.25 mg by mouth at bedtime.    . triamterene-hydrochlorothiazide (MAXZIDE-25) 37.5-25 MG tablet TK 1 T PO PRN    . vitamin B-12  (CYANOCOBALAMIN) 1000 MCG tablet Take 1,000 mcg by mouth daily.    Marland Kitchen acetaminophen (TYLENOL) 325 MG tablet Take 650 mg by mouth every 6 (six) hours as needed for fever or headache. (Patient not taking: No sig reported)    . albuterol (VENTOLIN HFA) 108 (90 Base) MCG/ACT inhaler Inhale into the lungs. (Patient not taking: No sig reported)    . ALPRAZolam (XANAX) 0.25 MG tablet Take 0.25 mg by mouth at bedtime.  (Patient not taking: Reported on 09/30/2020)  0  . clonazePAM (KLONOPIN) 0.5 MG tablet Take by mouth. (Patient not taking: Reported on 09/30/2020)    . denosumab (PROLIA) 60 MG/ML SOSY injection Inject into the skin. (Patient not taking: Reported on 09/30/2020)    . predniSONE (DELTASONE) 10 MG tablet Take by mouth. (Patient not taking: Reported on 09/30/2020)     No current facility-administered medications for this visit.    PHYSICAL EXAMINATION: ECOG PERFORMANCE STATUS: 0 - Asymptomatic  BP (!) 135/59   Pulse 62   Temp 98.7 F (37.1 C)   Resp 16   Wt 176 lb 6.4 oz (80 kg)   SpO2 97%   BMI 27.63 kg/m   Filed Weights   09/30/20 0945  Weight: 176 lb 6.4 oz (80 kg)    Physical Exam HENT:     Head: Normocephalic and atraumatic.     Mouth/Throat:     Pharynx: No oropharyngeal exudate.  Eyes:     Pupils: Pupils are equal, round, and reactive to light.  Cardiovascular:     Rate and Rhythm: Normal rate and regular rhythm.  Pulmonary:     Effort: No respiratory distress.     Breath sounds: No wheezing.  Abdominal:     General: Bowel sounds are normal. There is no distension.     Palpations: Abdomen is soft. There is no mass.     Tenderness: There is no abdominal tenderness. There is no guarding or rebound.  Musculoskeletal:        General: No tenderness. Normal range of motion.     Cervical back: Normal range of motion and neck supple.  Skin:    General: Skin is warm.  Neurological:     Mental Status: She is alert and oriented to person, place, and time.  Psychiatric:         Mood and Affect: Affect normal.     LABORATORY DATA:  I have reviewed the data as listed    Component Value Date/Time   NA 136 09/30/2020 0937   NA 142 08/16/2013 1028   K 4.5 09/30/2020 0937   K 4.5 08/16/2013 1028   CL 101 09/30/2020 0937   CL 106 08/16/2013 1028   CO2 24 09/30/2020 0937   CO2 27 08/16/2013 1028   GLUCOSE 95 09/30/2020 0937   GLUCOSE 99 08/16/2013 1028   BUN 43 (H) 09/30/2020 0937   BUN 21 (H) 08/16/2013 1028   CREATININE 2.43 (H) 09/30/2020 0937   CREATININE 1.42 (H) 09/07/2014 0954   CALCIUM 9.6 09/30/2020 0937   CALCIUM 9.3 09/07/2014 0954   PROT 7.5 06/21/2020 1441   PROT 7.4 08/16/2013 1028   ALBUMIN 4.3 06/21/2020 1441   ALBUMIN 3.9  08/16/2013 1028   AST 28 06/21/2020 1441   AST 27 08/16/2013 1028   ALT 11 06/21/2020 1441   ALT 15 08/16/2013 1028   ALKPHOS 62 06/21/2020 1441   ALKPHOS 61 08/16/2013 1028   BILITOT 0.5 06/21/2020 1441   BILITOT 0.3 08/16/2013 1028   GFRNONAA 20 (L) 09/30/2020 0937   GFRNONAA 36 (L) 09/07/2014 0954   GFRAA 19 (L) 02/09/2020 1001   GFRAA 42 (L) 09/07/2014 0954    No results found for: SPEP, UPEP  Lab Results  Component Value Date   WBC 6.8 09/30/2020   NEUTROABS 4.3 09/30/2020   HGB 10.7 (L) 09/30/2020   HCT 33.1 (L) 09/30/2020   MCV 97.9 09/30/2020   PLT 168 09/30/2020      Chemistry      Component Value Date/Time   NA 136 09/30/2020 0937   NA 142 08/16/2013 1028   K 4.5 09/30/2020 0937   K 4.5 08/16/2013 1028   CL 101 09/30/2020 0937   CL 106 08/16/2013 1028   CO2 24 09/30/2020 0937   CO2 27 08/16/2013 1028   BUN 43 (H) 09/30/2020 0937   BUN 21 (H) 08/16/2013 1028   CREATININE 2.43 (H) 09/30/2020 0937   CREATININE 1.42 (H) 09/07/2014 0954      Component Value Date/Time   CALCIUM 9.6 09/30/2020 0937   CALCIUM 9.3 09/07/2014 0954   ALKPHOS 62 06/21/2020 1441   ALKPHOS 61 08/16/2013 1028   AST 28 06/21/2020 1441   AST 27 08/16/2013 1028   ALT 11 06/21/2020 1441   ALT 15 08/16/2013  1028   BILITOT 0.5 06/21/2020 1441   BILITOT 0.3 08/16/2013 1028       RADIOGRAPHIC STUDIES: I have personally reviewed the radiological images as listed and agreed with the findings in the report. No results found.   ASSESSMENT & PLAN:    Multiple myeloma not having achieved remission ([place]) # Multiple myeloma-Dx: 2012 s/p autologous stem cell transplant in May 2013; OCT 2021- BMBx- plasma cells arranged in small clusters and aggregates [10% of total marrow cellularity]   #M protein [FEB 2022] 0.4; kappa lambda light chain ratio slightly abnormal; overall stable renal function; MM/K-not done today. Will repeat in 3 months; plan BMBx- if still getting worse.    #Continue monitoring at this time relative stability of her renal function.  Recommend monitoring myeloma panel closely.   # CKD stage IVcreatinine-creatinine 2.43/GFR-20-overall stable discussed with Dr. Candiss Norse.  Continue close monitoring.]   #Hyperkalemia potassium 5.6; today 4.5- STABLE;  Monitor for now.  Off Aldactone  # RA- [on Plaquenil]- STABLE.  # Right foot numbess; s/p Dr.Potter- await work up.   #DISPOSITION:  # follow up in 1st week of sep 2022-MD;1 week prior labs- cbc/cmp; LDH; MM panel; K/L light chains-  Dr.B  Cc; Dr.Hande/ Dr.Kernodle       Cammie Sickle, MD 09/30/2020 10:31 AM

## 2020-09-30 NOTE — Assessment & Plan Note (Addendum)
#  Multiple myeloma-Dx: 2012 s/p autologous stem cell transplant in May 2013; OCT 2021- BMBx- plasma cells arranged in small clusters and aggregates [10% of total marrow cellularity]   #M protein [FEB 2022] 0.4; kappa lambda light chain ratio slightly abnormal; overall stable renal function; MM/K-not done today. Will repeat in 3 months; plan BMBx- if still getting worse.    #Continue monitoring at this time relative stability of her renal function.  Recommend monitoring myeloma panel closely.   # CKD stage IVcreatinine-creatinine 2.43/GFR-20-overall stable discussed with Dr. Candiss Norse.  Continue close monitoring.]   #Hyperkalemia potassium 5.6; today 4.5- STABLE;  Monitor for now.  Off Aldactone  # RA- [on Plaquenil]- STABLE.  # Right foot numbess; s/p Dr.Potter- await work up.   #DISPOSITION:  # follow up in 1st week of sep 2022-MD;1 week prior labs- cbc/cmp; LDH; MM panel; K/L light chains-  Dr.B  Cc; Dr.Hande/ Dr.Kernodle

## 2020-10-21 ENCOUNTER — Other Ambulatory Visit: Payer: Self-pay

## 2020-10-21 ENCOUNTER — Ambulatory Visit (INDEPENDENT_AMBULATORY_CARE_PROVIDER_SITE_OTHER): Payer: Medicare Other | Admitting: Urology

## 2020-10-21 VITALS — BP 120/83 | HR 68 | Ht 65.0 in | Wt 170.0 lb

## 2020-10-21 DIAGNOSIS — N3946 Mixed incontinence: Secondary | ICD-10-CM | POA: Diagnosis not present

## 2020-10-21 MED ORDER — MIRABEGRON ER 50 MG PO TB24: 50.0000 mg | ORAL_TABLET | Freq: Every day | ORAL | 3 refills | Status: DC

## 2020-10-21 MED ORDER — OXYBUTYNIN CHLORIDE ER 10 MG PO TB24: 10.0000 mg | ORAL_TABLET | Freq: Every day | ORAL | 11 refills | Status: DC

## 2020-10-21 NOTE — Progress Notes (Signed)
10/21/2020 11:04 AM   Sydney Gillespie 17-Dec-1940 [id number]  Referring provider: [name], [place] [facility] [place],  [place] [address]  Chief Complaint  Patient presents with   Urinary Incontinence    HPI: She leaks with coughing sneezing bending and lifting.  She has urge incontinence. Both are significant.  She says her main complaint is when she goes from a sitting to standing position the urine just comes out.  She wears 3 or 4 pads a day that can be soaked.  She has moderately severe bedwetting.   Mild hypermobility of the bladder neck and a mild positive cough test with no prolapse   The patient has mixed incontinence.  I think both likely are significant.  I wonder if the overactive bladder component is the most significant.  She does have milder to moderate frequency and nocturia.  She also has moderately severe bedwetting.  She likely is triggering overactivity when she goes from a sitting to standing position but she does have reasonably significant stress incontinence.  If the patient ever did have her stress incontinence treated urethral injectables or sling would be reasonable   On urodynamics the patient did not void and was catheterized for 75 mL.  Bladder capacity was 220 mL.  The patient had sensory urgency.  The bladder was unstable reaching pressures of 10 cm of water associated with severe leakage.  She was triggering a lot of overactivity when she would cough.  It made assessment of stress incontinence more difficult.  At 150 mL her cough leak point pressure is 58 cm of water associated with mild to moderate leakage.  There was a lot of triggering.  During voluntary voiding she voided 57 mL with a maximum flow of 8 mils per second and a residual was 100 mL.  Bladder neck descent at 1 cm.  The leakage was severe with her bladder overactivity.  It was moderate in severity with stress maneuvers.   In my opinion approximately 80% of the  patient's bladder dysfunction is due to an overactive bladder.  Medical and behavioral therapy discussed including physical therapy.  If she does not reach her goal one would consider an outlet procedure but I would want to re-quantitate her goals and stress incontinence recognizing that a refractory overactive bladder therapy may be prudent to consider instead, especially with her bedwetting and triggering with standing.  Reassess on Myrbetriq in 5 weeks   Patient on Myrbetriq has improved and now wears 2 pads a day instead of 3 or 4.     Would like to see her in 5 weeks on Toviaz 8 mg samples and Myrbetriq 50 mg.  This is double therapy.  I will take away the Myrbetriq if she is doing great.  I will discuss surgery versus refractory therapies versus a trial of another antimuscarinic as add on double therapy with Myrbetriq next time.   The patient's mixed incontinence is 80% better.  At night she is dramatically better and only wears a liner.  When the Myrbetriq samples stopped she started leaking a lot again high volume.  She is very pleased.  She can live with a stress incontinence.    Patient still leaks with activity and there was one high-volume episode this is uncommon.  She said the stress incontinence is worse in the residual urge incontinence.  She still wears 1 and sometimes 2 pads a day and agrees overall for her mixed incontinence she is about 80%  better and reasonably pleased.   She was wondering what a sling in detail.  I drew her a picture.  I went through my entire template in detail.  We talked about mesh issues.  We talked about overactive bladder concerns with persistent or worsening symptoms.   Mixed incontinence.  Patient now on oxybutynin and Myrbetriq.  I will see her in 1 year.  She will call if she like to schedule surgery.  She is leaning towards not doing surgery   Today Frequency stable.  Doing excellent on oxybutynin with very little urgency incontinence.  Clinically no  infections.  [id number] sent to pharmacy and see near   Today Patient is urge incontinence with triggers going from sitting to standing position of worsen last few months but she is going through a lot of stress.  Her multiple myeloma came back but they are treating it with watchful waiting.  Unfortunately her husband over 3 or 4 months.  Was diagnosed with metastatic bladder cancer treated in this office.    I gave her 4 months of Myrbetriq samples and she will take with the oxybutynin.  She was having trouble to afford it.  She understands the stress could have made things worse and certainly she should not have more invasive treatments at this stage.  I am concerned in the future perform the sling with her about overactive bladder.  She describes sitting in a chair and leaking a large amount in the emergency room which would be due to t overactivity.  Call if urine culture is positive  Today Frequency stable.  3 to 4 days a week the patient is completely dry.  She also has stress and urge incontinence.  She is drinking a lot of water on oxybutynin and Myrbetriq.  She can be sitting in a chair and have high-volume leakage not associated awareness.   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[facility])    Lumbago    Lumbar radiculitis    Lumbar stenosis with neurogenic claudication    Multiple myeloma ([facility])    Multiple myeloma ([facility])    Osteopenia    Osteoporosis     Other dysphagia    Palpitations    Positive PPD    Renal insufficiency    Rheumatoid arthritis ([facility])    Rheumatoid arthritis (H[facility])    Seasonal allergies    Vertigo     Surgical History: Past Surgical History:  Procedure Laterality Date   ABDOMINAL HYSTERECTOMY     APPENDECTOMY     BACK SURGERY     BREAST EXCISIONAL BIOPSY Bilateral    neg   CHOLECYSTECTOMY     COLONOSCOPY WITH PROPOFOL N/A 11/12/2016   Procedure: COLONOSCOPY WITH PROPOFOL;  Surgeon: Manya Silvas, MD;  Location: [facility] ENDOSCOPY;  Service: Endoscopy;  Laterality: N/A;  ESOPHAGOGASTRODUODENOSCOPY (EGD) WITH PROPOFOL N/A 11/12/2016   Procedure: ESOPHAGOGASTRODUODENOSCOPY (EGD) WITH PROPOFOL;  Surgeon: Manya Silvas, MD;  Location: [facility] ENDOSCOPY;  Service: Endoscopy;  Laterality: N/A;   FOOT SURGERY     LUMBAR LAMINECTOMY/DECOMPRESSION MICRODISCECTOMY Left 06/18/2016   Procedure: LAMINECTOMY FOR FACET/SYNOVIAL CYST LUMBAR THREE - LUMBAR FOUR LEFT;  Surgeon: Newman Pies, MD;  Location: [facility];  Service: Neurosurgery;  Laterality: Left;  LAMINECTOMY FOR FACET/SYNOVIAL CYST LUMBAR THREE - LUMBAR FOUR LEFT    Home Medications:  Allergies as of 10/21/2020       Reactions   Isoniazid Other (See Comments)   Blisters    Keflex [cephalexin] Other (See Comments)   Blisters        Medication List        Accurate as of October 21, 2020 11:04 AM. If you have any questions, ask your nurse or doctor.          acetaminophen 325 MG tablet Commonly known as: TYLENOL Take 650 mg by mouth every 6 (six) hours as needed for fever or headache.   albuterol 108 (90 Base) MCG/ACT inhaler Commonly known as: VENTOLIN HFA Inhale into the lungs.   allopurinol 300 MG tablet Commonly known as: ZYLOPRIM Take 300 mg by mouth daily.   ALPRAZolam 0.25 MG tablet Commonly known as: XANAX Take 0.25 mg by mouth at bedtime.   atenolol 50 MG tablet Commonly known as: TENORMIN Take 0.5 tablets (25 mg total) by mouth daily.    cetirizine 10 MG tablet Commonly known as: ZYRTEC Take 10 mg by mouth 2 (two) times daily.   clonazePAM 0.5 MG tablet Commonly known as: KLONOPIN Take by mouth.   DULoxetine 20 MG capsule Commonly known as: CYMBALTA   fluticasone 50 MCG/ACT nasal spray Commonly known as: FLONASE Place 2 sprays into the nose daily.   hydroxychloroquine 200 MG tablet Commonly known as: PLAQUENIL TAKE 2 TABLETS BY MOUTH EVERY DAY   lansoprazole 30 MG capsule Commonly known as: PREVACID Take 30 mg by mouth daily at 12 noon.   leflunomide 10 MG tablet Commonly known as: ARAVA Take 1 tablet by mouth daily.   levothyroxine 75 MCG tablet Commonly known as: SYNTHROID Take 75 mcg by mouth daily before breakfast.   levothyroxine 88 MCG tablet Commonly known as: SYNTHROID Take by mouth.   mirabegron ER 50 MG Tb24 tablet Commonly known as: MYRBETRIQ Take by mouth.   ondansetron 8 MG tablet Commonly known as: ZOFRAN One pill every 8 hours as needed for nausea/vomitting.   oxybutynin 10 MG 24 hr tablet Commonly known as: DITROPAN-XL Take 1 tablet (10 mg total) by mouth daily.   predniSONE 5 MG tablet Commonly known as: DELTASONE Take by mouth. What changed: Another medication with the same name was removed. Continue taking this medication, and follow the directions you see here. Changed by: Reece Packer, MD   Prolia 60 MG/ML Sosy injection Generic drug: denosumab Inject into the skin.   rOPINIRole 0.5 MG tablet Commonly known as: REQUIP Take by mouth. What changed: Another medication with the same name was removed. Continue taking this medication, and follow the directions you see here. Changed by: Reece Packer, MD   triamterene-hydrochlorothiazide 37.5-25 MG tablet Commonly known as: MAXZIDE-25 TK 1 T PO PRN   vitamin B-12 1000 MCG tablet Commonly known as: CYANOCOBALAMIN Take 1,000 mcg by mouth daily.        Allergies:  Allergies  Allergen Reactions    Isoniazid Other (See Comments)  Blisters    Keflex [Cephalexin] Other (See Comments)    Blisters     Family History: Family History  Problem Relation Age of Onset   Hypertension Mother    Heart attack Mother    Rheum arthritis Mother    Osteoarthritis Mother    Cancer Father    Hypertension Father    Stroke Father    Breast cancer Maternal Grandmother 40   Osteoarthritis Other    Rheum arthritis Other     Social History:  reports that she has never smoked. She has never used smokeless tobacco. She reports that she does not drink alcohol and does not use drugs.  ROS:                                        Physical Exam: There were no vitals taken for this visit.  Constitutional:  Alert and oriented, No acute distress.   Laboratory Data: Lab Results  Component Value Date   WBC 6.8 09/30/2020   HGB 10.7 (L) 09/30/2020   HCT 33.1 (L) 09/30/2020   MCV 97.9 09/30/2020   PLT 168 09/30/2020    Lab Results  Component Value Date   CREATININE 2.43 (H) 09/30/2020    No results found for: PSA  No results found for: TESTOSTERONE  No results found for: HGBA1C  Urinalysis    Component Value Date/Time   APPEARANCEUR Clear 06/24/2020 1113   GLUCOSEU Negative 06/24/2020 1113   BILIRUBINUR Negative 06/24/2020 1113   PROTEINUR Negative 06/24/2020 1113   NITRITE Negative 06/24/2020 1113   LEUKOCYTESUR Trace (A) 06/24/2020 1113    Pertinent Imaging:   Assessment & Plan: Patient and I reviewed the 3 refractory treatments once again.  Clinically she primarily has an overactive bladder.  Based on insurance not a good candidate at this point for ankle implant and she does have multiple myeloma.  He would like to proceed with InterStim.  Handouts on all 3 given.  Usual templates discussed in detail.  Husband died of metastatic bladder cancer.  Multiple myeloma is being treated with watchful waiting.  Renewed both medications.  Samples of Myrbetriq  given  There are no diagnoses linked to this encounter.  No follow-ups on file.  Reece Packer, MD  [facility] [address], [place], [place] [address] [phone]

## 2021-01-06 ENCOUNTER — Inpatient Hospital Stay: Payer: Medicare Other | Attending: Nurse Practitioner

## 2021-01-06 DIAGNOSIS — C9000 Multiple myeloma not having achieved remission: Secondary | ICD-10-CM | POA: Insufficient documentation

## 2021-01-06 LAB — CBC WITH DIFFERENTIAL/PLATELET
Abs Immature Granulocytes: 0.04 10*3/uL (ref 0.00–0.07)
Basophils Absolute: 0.1 10*3/uL (ref 0.0–0.1)
Basophils Relative: 1 %
Eosinophils Absolute: 0.1 10*3/uL (ref 0.0–0.5)
Eosinophils Relative: 2 %
HCT: 32.3 % — ABNORMAL LOW (ref 36.0–46.0)
Hemoglobin: 10.5 g/dL — ABNORMAL LOW (ref 12.0–15.0)
Immature Granulocytes: 1 %
Lymphocytes Relative: 21 %
Lymphs Abs: 1.4 10*3/uL (ref 0.7–4.0)
MCH: 31.6 pg (ref 26.0–34.0)
MCHC: 32.5 g/dL (ref 30.0–36.0)
MCV: 97.3 fL (ref 80.0–100.0)
Monocytes Absolute: 0.5 10*3/uL (ref 0.1–1.0)
Monocytes Relative: 7 %
Neutro Abs: 4.8 10*3/uL (ref 1.7–7.7)
Neutrophils Relative %: 68 %
Platelets: 164 10*3/uL (ref 150–400)
RBC: 3.32 MIL/uL — ABNORMAL LOW (ref 3.87–5.11)
RDW: 13.4 % (ref 11.5–15.5)
WBC: 6.9 10*3/uL (ref 4.0–10.5)
nRBC: 0 % (ref 0.0–0.2)

## 2021-01-06 LAB — COMPREHENSIVE METABOLIC PANEL
ALT: 10 U/L (ref 0–44)
AST: 26 U/L (ref 15–41)
Albumin: 3.9 g/dL (ref 3.5–5.0)
Alkaline Phosphatase: 72 U/L (ref 38–126)
Anion gap: 8 (ref 5–15)
BUN: 46 mg/dL — ABNORMAL HIGH (ref 8–23)
CO2: 22 mmol/L (ref 22–32)
Calcium: 9.4 mg/dL (ref 8.9–10.3)
Chloride: 106 mmol/L (ref 98–111)
Creatinine, Ser: 2.13 mg/dL — ABNORMAL HIGH (ref 0.44–1.00)
GFR, Estimated: 23 mL/min — ABNORMAL LOW (ref >60)
Glucose, Bld: 99 mg/dL (ref 70–99)
Potassium: 4.1 mmol/L (ref 3.5–5.1)
Sodium: 136 mmol/L (ref 135–145)
Total Bilirubin: 0.6 mg/dL (ref 0.3–1.2)
Total Protein: 6.8 g/dL (ref 6.5–8.1)

## 2021-01-06 LAB — LACTATE DEHYDROGENASE: LDH: 183 U/L (ref 98–192)

## 2021-01-07 LAB — KAPPA/LAMBDA LIGHT CHAINS
Kappa free light chain: 120.7 mg/L — ABNORMAL HIGH (ref 3.3–19.4)
Kappa, lambda light chain ratio: 4.41 — ABNORMAL HIGH (ref 0.26–1.65)
Lambda free light chains: 27.4 mg/L — ABNORMAL HIGH (ref 5.7–26.3)

## 2021-01-09 LAB — MULTIPLE MYELOMA PANEL, SERUM
Albumin SerPl Elph-Mcnc: 3.5 g/dL (ref 2.9–4.4)
Albumin/Glob SerPl: 1.4 (ref 0.7–1.7)
Alpha 1: 0.3 g/dL (ref 0.0–0.4)
Alpha2 Glob SerPl Elph-Mcnc: 0.9 g/dL (ref 0.4–1.0)
B-Globulin SerPl Elph-Mcnc: 0.8 g/dL (ref 0.7–1.3)
Gamma Glob SerPl Elph-Mcnc: 0.7 g/dL (ref 0.4–1.8)
Globulin, Total: 2.6 g/dL (ref 2.2–3.9)
IgA: 63 mg/dL — ABNORMAL LOW (ref 64–422)
IgG (Immunoglobin G), Serum: 806 mg/dL (ref 586–1602)
IgM (Immunoglobulin M), Srm: 27 mg/dL (ref 26–217)
M Protein SerPl Elph-Mcnc: 0.3 g/dL — ABNORMAL HIGH
Total Protein ELP: 6.1 g/dL (ref 6.0–8.5)

## 2021-01-14 ENCOUNTER — Inpatient Hospital Stay: Payer: Medicare Other | Attending: Internal Medicine | Admitting: Nurse Practitioner

## 2021-01-14 ENCOUNTER — Encounter: Payer: Self-pay | Admitting: Nurse Practitioner

## 2021-01-14 ENCOUNTER — Other Ambulatory Visit: Payer: Self-pay

## 2021-01-14 VITALS — BP 161/74 | HR 68 | Temp 97.2°F | Wt 178.2 lb

## 2021-01-14 DIAGNOSIS — N184 Chronic kidney disease, stage 4 (severe): Secondary | ICD-10-CM | POA: Diagnosis not present

## 2021-01-14 DIAGNOSIS — C9000 Multiple myeloma not having achieved remission: Secondary | ICD-10-CM | POA: Diagnosis not present

## 2021-01-14 DIAGNOSIS — M722 Plantar fascial fibromatosis: Secondary | ICD-10-CM | POA: Diagnosis not present

## 2021-01-14 DIAGNOSIS — R5383 Other fatigue: Secondary | ICD-10-CM | POA: Insufficient documentation

## 2021-01-14 DIAGNOSIS — Z79899 Other long term (current) drug therapy: Secondary | ICD-10-CM | POA: Diagnosis not present

## 2021-01-14 DIAGNOSIS — M549 Dorsalgia, unspecified: Secondary | ICD-10-CM | POA: Insufficient documentation

## 2021-01-14 DIAGNOSIS — M069 Rheumatoid arthritis, unspecified: Secondary | ICD-10-CM | POA: Insufficient documentation

## 2021-01-14 DIAGNOSIS — Z9484 Stem cells transplant status: Secondary | ICD-10-CM | POA: Diagnosis not present

## 2021-01-14 DIAGNOSIS — G8929 Other chronic pain: Secondary | ICD-10-CM | POA: Insufficient documentation

## 2021-01-14 DIAGNOSIS — E875 Hyperkalemia: Secondary | ICD-10-CM | POA: Diagnosis not present

## 2021-01-14 DIAGNOSIS — Z881 Allergy status to other antibiotic agents status: Secondary | ICD-10-CM | POA: Diagnosis not present

## 2021-01-14 DIAGNOSIS — G629 Polyneuropathy, unspecified: Secondary | ICD-10-CM | POA: Insufficient documentation

## 2021-01-14 NOTE — Progress Notes (Signed)
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  No evidence of organ dysfunction; continue close surveillance.  # CKD [~ creat 1.35- 1.7] ; Dr.Singh  # 2012- LEFT KIDNEY ? 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 She feels well. Continues to grieve loss of her husband. Has been seen at urology for overactive bladder. No fevers, chills, new bone pain. No shortness of breath or cough. Neuropathy is stable.   Review of Systems  Constitutional:  Positive for malaise/fatigue. Negative for diaphoresis, fever and weight loss.       Cold intolerance.  HENT:  Negative for nosebleeds and sore throat.   Eyes:  Negative for double vision.  Respiratory:  Negative for cough, hemoptysis, sputum production, shortness of breath and wheezing.   Cardiovascular:  Negative for chest pain, palpitations, orthopnea and leg swelling.  Gastrointestinal:  Negative for abdominal pain, blood  in stool, constipation, diarrhea, heartburn, melena, nausea and vomiting.  Genitourinary:  Negative for dysuria, frequency and urgency.       Per hpi  Musculoskeletal:  Positive for back pain (chronic). Negative for joint pain and myalgias.  Skin: Negative.  Negative for itching and rash.  Neurological:  Positive for tingling (chronic). Negative for dizziness, focal weakness, weakness and headaches.  Endo/Heme/Allergies:  Does not bruise/bleed easily.  Psychiatric/Behavioral:  Negative for depression. The patient is not nervous/anxious and does not have insomnia.    ALLERGIES:  is allergic to isoniazid and keflex [cephalexin].  MEDICATIONS:  Current Outpatient Medications  Medication Sig Dispense Refill   acetaminophen (TYLENOL) 325 MG tablet Take 650 mg by mouth every 6 (six) hours as needed for fever or headache.     allopurinol (ZYLOPRIM) 300 MG tablet Take 300 mg by mouth daily.      ALPRAZolam (XANAX) 0.25 MG tablet Take 0.25 mg by mouth at bedtime.  0   atenolol (TENORMIN) 50 MG tablet Take 0.5 tablets (25 mg total) by mouth daily. 30 tablet 0   cetirizine (ZYRTEC) 10 MG tablet Take 10 mg by mouth 2 (two) times daily.      denosumab (PROLIA) 60 MG/ML SOSY injection Inject into the skin.     DULoxetine (CYMBALTA) 20 MG capsule      fluticasone (FLONASE) 50 MCG/ACT nasal spray Place 2 sprays into the nose daily.      hydroxychloroquine (PLAQUENIL) 200 MG tablet daily.     lansoprazole (PREVACID) 30 MG capsule Take 30 mg by mouth daily at 12 noon.     leflunomide (ARAVA) 10 MG tablet Take 1  tablet by mouth daily.     levothyroxine (SYNTHROID) 88 MCG tablet Take by mouth.     levothyroxine (SYNTHROID, LEVOTHROID) 75 MCG tablet Take 75 mcg by mouth daily before breakfast.   5   ondansetron (ZOFRAN) 8 MG tablet One pill every 8 hours as needed for nausea/vomitting. 40 tablet 1   oxybutynin (DITROPAN-XL) 10 MG 24 hr tablet Take 1 tablet (10 mg total) by mouth daily. 30 tablet 11    rOPINIRole (REQUIP) 0.5 MG tablet Take by mouth.     triamterene-hydrochlorothiazide (MAXZIDE-25) 37.5-25 MG tablet TK 1 T PO PRN     vitamin B-12 (CYANOCOBALAMIN) 1000 MCG tablet Take 1,000 mcg by mouth daily.     clonazePAM (KLONOPIN) 0.5 MG tablet Take by mouth. (Patient not taking: No sig reported)     No current facility-administered medications for this visit.    PHYSICAL EXAMINATION: ECOG PERFORMANCE STATUS: 1 - Symptomatic but completely ambulatory  BP (!) 161/74 (BP Location: Left Arm, Patient Position: Sitting)   Pulse 68   Temp (!) 97.2 F (36.2 C) (Tympanic)   Wt 178 lb 3.2 oz (80.8 kg)   SpO2 100%   BMI 29.65 kg/m   Filed Weights   01/14/21 1312  Weight: 178 lb 3.2 oz (80.8 kg)    Physical Exam Constitutional:      Appearance: She is not ill-appearing.  Eyes:     General: No scleral icterus.    Conjunctiva/sclera: Conjunctivae normal.  Cardiovascular:     Rate and Rhythm: Normal rate and regular rhythm.  Pulmonary:     Effort: Pulmonary effort is normal.     Breath sounds: Normal breath sounds.  Abdominal:     General: There is no distension.     Palpations: Abdomen is soft.     Tenderness: There is no abdominal tenderness. There is no guarding.  Musculoskeletal:        General: No deformity.     Right lower leg: No edema.     Left lower leg: No edema.  Lymphadenopathy:     Cervical: No cervical adenopathy.  Skin:    General: Skin is warm and dry.  Neurological:     Mental Status: She is alert and oriented to person, place, and time. Mental status is at baseline.  Psychiatric:        Mood and Affect: Mood normal.        Behavior: Behavior normal.    LABORATORY DATA:  I have reviewed the data as listed    Component Value Date/Time   NA 136 01/06/2021 1125   NA 142 08/16/2013 1028   K 4.1 01/06/2021 1125   K 4.5 08/16/2013 1028   CL 106 01/06/2021 1125   CL 106 08/16/2013 1028   CO2 22 01/06/2021 1125   CO2 27 08/16/2013 1028   GLUCOSE 99  01/06/2021 1125   GLUCOSE 99 08/16/2013 1028   BUN 46 (H) 01/06/2021 1125   BUN 21 (H) 08/16/2013 1028   CREATININE 2.13 (H) 01/06/2021 1125   CREATININE 1.42 (H) 09/07/2014 0954   CALCIUM 9.4 01/06/2021 1125   CALCIUM 9.3 09/07/2014 0954   PROT 6.8 01/06/2021 1125   PROT 7.4 08/16/2013 1028   ALBUMIN 3.9 01/06/2021 1125   ALBUMIN 3.9 08/16/2013 1028   AST 26 01/06/2021 1125   AST 27 08/16/2013 1028   ALT 10 01/06/2021 1125   ALT 15 08/16/2013 1028   ALKPHOS 72 01/06/2021 1125   ALKPHOS 61 08/16/2013 1028   BILITOT 0.6  01/06/2021 1125   BILITOT 0.3 08/16/2013 1028   GFRNONAA 23 (L) 01/06/2021 1125   GFRNONAA 36 (L) 09/07/2014 0954   GFRAA 19 (L) 02/09/2020 1001   GFRAA 42 (L) 09/07/2014 0954    No results found for: SPEP, UPEP  Lab Results  Component Value Date   WBC 6.9 01/06/2021   NEUTROABS 4.8 01/06/2021   HGB 10.5 (L) 01/06/2021   HCT 32.3 (L) 01/06/2021   MCV 97.3 01/06/2021   PLT 164 01/06/2021      Chemistry      Component Value Date/Time   NA 136 01/06/2021 1125   NA 142 08/16/2013 1028   K 4.1 01/06/2021 1125   K 4.5 08/16/2013 1028   CL 106 01/06/2021 1125   CL 106 08/16/2013 1028   CO2 22 01/06/2021 1125   CO2 27 08/16/2013 1028   BUN 46 (H) 01/06/2021 1125   BUN 21 (H) 08/16/2013 1028   CREATININE 2.13 (H) 01/06/2021 1125   CREATININE 1.42 (H) 09/07/2014 0954      Component Value Date/Time   CALCIUM 9.4 01/06/2021 1125   CALCIUM 9.3 09/07/2014 0954   ALKPHOS 72 01/06/2021 1125   ALKPHOS 61 08/16/2013 1028   AST 26 01/06/2021 1125   AST 27 08/16/2013 1028   ALT 10 01/06/2021 1125   ALT 15 08/16/2013 1028   BILITOT 0.6 01/06/2021 1125   BILITOT 0.3 08/16/2013 1028       RADIOGRAPHIC STUDIES: I have personally reviewed the radiological images as listed and agreed with the findings in the report. No results found.   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 October 2021-  BMBx- plasma cell arranged in small clusters and aggregates; 10% of total marrow cellularity.  M protein- stable at 0.3. Kappa lambda light chain ratio increased to 4.41 but stable compared to past year. Was 5.03 in October 2021. Renal function improved (see below). Continue close monitoring at this time given stability of her renal function. Plan to repeat labs in 3 months. If worse, consider BMBx.   2. CKD- stage IV. Creatinine clearance improved to 2.13 (previously 2.43). GFR 23. Followed by Dr. Candiss Norse. Next appt 9/14.   3. Hyperkalemia- off aldactone. Previously 5.6, today 4.1. Monitor.   4. RA- on plaquenil. Stable.    5. Neuropathy & Foot pain- managed by Dr. Melrose Nakayama and podiatry. Diagnosed with plantar fasciitis.   DISPOSITION:  RTC in 3 months for labs then day to week later follow up with Dr. Rogue Bussing  No problem-specific Assessment & Plan notes found for this encounter.  Beckey Rutter, DNP, AGNP-C [place] at [facility] [phone] (clinic)

## 2021-01-17 ENCOUNTER — Encounter: Payer: Self-pay | Admitting: Internal Medicine

## 2021-02-17 ENCOUNTER — Ambulatory Visit: Payer: Self-pay | Admitting: Urology

## 2021-02-27 ENCOUNTER — Other Ambulatory Visit: Payer: Self-pay | Admitting: Internal Medicine

## 2021-03-04 ENCOUNTER — Other Ambulatory Visit: Payer: Self-pay | Admitting: Internal Medicine

## 2021-03-04 DIAGNOSIS — Z1231 Encounter for screening mammogram for malignant neoplasm of breast: Secondary | ICD-10-CM

## 2021-03-25 ENCOUNTER — Ambulatory Visit
Admission: RE | Admit: 2021-03-25 | Discharge: 2021-03-25 | Disposition: A | Discharge status: Home / Self Care | Payer: Medicare Other | Source: Ambulatory Visit | Attending: Internal Medicine | Admitting: Internal Medicine

## 2021-03-25 ENCOUNTER — Other Ambulatory Visit: Payer: Self-pay

## 2021-03-25 DIAGNOSIS — Z1231 Encounter for screening mammogram for malignant neoplasm of breast: Secondary | ICD-10-CM | POA: Diagnosis present

## 2021-04-15 ENCOUNTER — Inpatient Hospital Stay: Payer: Medicare Other

## 2021-04-22 ENCOUNTER — Ambulatory Visit: Payer: Medicare Other | Admitting: Internal Medicine

## 2021-05-01 ENCOUNTER — Inpatient Hospital Stay: Payer: Medicare Other | Attending: Internal Medicine

## 2021-05-01 ENCOUNTER — Other Ambulatory Visit: Payer: Self-pay

## 2021-05-01 DIAGNOSIS — Z881 Allergy status to other antibiotic agents status: Secondary | ICD-10-CM | POA: Diagnosis not present

## 2021-05-01 DIAGNOSIS — R5383 Other fatigue: Secondary | ICD-10-CM | POA: Insufficient documentation

## 2021-05-01 DIAGNOSIS — Z9481 Bone marrow transplant status: Secondary | ICD-10-CM | POA: Insufficient documentation

## 2021-05-01 DIAGNOSIS — N189 Chronic kidney disease, unspecified: Secondary | ICD-10-CM | POA: Insufficient documentation

## 2021-05-01 DIAGNOSIS — M069 Rheumatoid arthritis, unspecified: Secondary | ICD-10-CM | POA: Diagnosis not present

## 2021-05-01 DIAGNOSIS — C9000 Multiple myeloma not having achieved remission: Secondary | ICD-10-CM

## 2021-05-01 DIAGNOSIS — M549 Dorsalgia, unspecified: Secondary | ICD-10-CM | POA: Insufficient documentation

## 2021-05-01 DIAGNOSIS — M791 Myalgia, unspecified site: Secondary | ICD-10-CM | POA: Diagnosis not present

## 2021-05-01 DIAGNOSIS — C9001 Multiple myeloma in remission: Secondary | ICD-10-CM | POA: Diagnosis not present

## 2021-05-01 DIAGNOSIS — Z79899 Other long term (current) drug therapy: Secondary | ICD-10-CM | POA: Insufficient documentation

## 2021-05-01 DIAGNOSIS — E875 Hyperkalemia: Secondary | ICD-10-CM | POA: Insufficient documentation

## 2021-05-01 DIAGNOSIS — M2550 Pain in unspecified joint: Secondary | ICD-10-CM | POA: Insufficient documentation

## 2021-05-01 LAB — CBC WITH DIFFERENTIAL/PLATELET
Abs Immature Granulocytes: 0.06 10*3/uL (ref 0.00–0.07)
Basophils Absolute: 0.1 10*3/uL (ref 0.0–0.1)
Basophils Relative: 1 %
Eosinophils Absolute: 0.2 10*3/uL (ref 0.0–0.5)
Eosinophils Relative: 2 %
HCT: 34.1 % — ABNORMAL LOW (ref 36.0–46.0)
Hemoglobin: 11 g/dL — ABNORMAL LOW (ref 12.0–15.0)
Immature Granulocytes: 1 %
Lymphocytes Relative: 16 %
Lymphs Abs: 1.4 10*3/uL (ref 0.7–4.0)
MCH: 31.7 pg (ref 26.0–34.0)
MCHC: 32.3 g/dL (ref 30.0–36.0)
MCV: 98.3 fL (ref 80.0–100.0)
Monocytes Absolute: 0.5 10*3/uL (ref 0.1–1.0)
Monocytes Relative: 6 %
Neutro Abs: 6.3 10*3/uL (ref 1.7–7.7)
Neutrophils Relative %: 74 %
Platelets: 244 10*3/uL (ref 150–400)
RBC: 3.47 MIL/uL — ABNORMAL LOW (ref 3.87–5.11)
RDW: 14.2 % (ref 11.5–15.5)
WBC: 8.5 10*3/uL (ref 4.0–10.5)
nRBC: 0 % (ref 0.0–0.2)

## 2021-05-01 LAB — COMPREHENSIVE METABOLIC PANEL
ALT: 9 U/L (ref 0–44)
AST: 25 U/L (ref 15–41)
Albumin: 3.9 g/dL (ref 3.5–5.0)
Alkaline Phosphatase: 78 U/L (ref 38–126)
Anion gap: 9 (ref 5–15)
BUN: 33 mg/dL — ABNORMAL HIGH (ref 8–23)
CO2: 21 mmol/L — ABNORMAL LOW (ref 22–32)
Calcium: 9.8 mg/dL (ref 8.9–10.3)
Chloride: 104 mmol/L (ref 98–111)
Creatinine, Ser: 2.21 mg/dL — ABNORMAL HIGH (ref 0.44–1.00)
GFR, Estimated: 22 mL/min — ABNORMAL LOW (ref >60)
Glucose, Bld: 100 mg/dL — ABNORMAL HIGH (ref 70–99)
Potassium: 3.9 mmol/L (ref 3.5–5.1)
Sodium: 134 mmol/L — ABNORMAL LOW (ref 135–145)
Total Bilirubin: 0.3 mg/dL (ref 0.3–1.2)
Total Protein: 7.4 g/dL (ref 6.5–8.1)

## 2021-05-01 LAB — LACTATE DEHYDROGENASE: LDH: 158 U/L (ref 98–192)

## 2021-05-02 LAB — KAPPA/LAMBDA LIGHT CHAINS
Kappa free light chain: 186.9 mg/L — ABNORMAL HIGH (ref 3.3–19.4)
Kappa, lambda light chain ratio: 4.23 — ABNORMAL HIGH (ref 0.26–1.65)
Lambda free light chains: 44.2 mg/L — ABNORMAL HIGH (ref 5.7–26.3)

## 2021-05-07 ENCOUNTER — Other Ambulatory Visit: Payer: Self-pay

## 2021-05-07 ENCOUNTER — Inpatient Hospital Stay (HOSPITAL_BASED_OUTPATIENT_CLINIC_OR_DEPARTMENT_OTHER): Payer: Medicare Other | Admitting: Internal Medicine

## 2021-05-07 ENCOUNTER — Encounter: Payer: Self-pay | Admitting: Internal Medicine

## 2021-05-07 DIAGNOSIS — C9001 Multiple myeloma in remission: Secondary | ICD-10-CM | POA: Diagnosis not present

## 2021-05-07 DIAGNOSIS — C9000 Multiple myeloma not having achieved remission: Secondary | ICD-10-CM

## 2021-05-07 NOTE — Assessment & Plan Note (Addendum)
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  Kappa lambda light chain ratio= overall stable[however kappa light chain 198]-stable renal function stable hemoglobin.  Monitor for now.   # CKD stage IVcreatinine-creatinine 2.43/GFR-22-overall stable discussed with Dr. Candiss Norse.  Continue close monitoring.] -STABLE  #Hyperkalemia potassium 3.9-STABLE;  Monitor for now.  Off Aldactone  # RA- [on Plaquenil]- STABLE.  #DISPOSITION:  # follow up 6 Months MD; 2 week prior labs- cbc/cmp; LDH; MM panel; K/L light chains-  Dr.B  Cc; Dr.Hande/ Dr.Kernodle

## 2021-05-07 NOTE — Progress Notes (Signed)
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  No evidence of organ dysfunction; continue close surveillance.  # CKD [~ creat 1.35- 1.7] ; Dr.Singh  # 2012- LEFT KIDNEY ? 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 benign   Multiple myeloma not having achieved remission ([place])  12/18/2015 Initial Diagnosis   Multiple myeloma in remission ([place])          INTERVAL HISTORY: Ambulating independently.  Accompanied by her grand daughter-in-law. This is a pleasant 80 year-old female patient with above history of multiple Myeloma [status post autologous stem cell transplant May 2013 patient denies any] currently on surveillance/no maintenance therapy is here for follow-up.  Denies any worsening tingling or numbness.  Denies any nausea vomiting denies any fevers or chills.   Denies any worsening back pain.  Otherwise no chest pain or shortness of breath or cough.  Review of Systems  Constitutional:  Positive for malaise/fatigue. Negative for diaphoresis, fever and weight loss.       Cold intolerance.  HENT:  Negative for nosebleeds and sore throat.   Eyes:  Negative for double vision.  Respiratory:  Negative for cough, hemoptysis, sputum production, shortness of breath and wheezing.   Cardiovascular:  Negative for chest pain, palpitations, orthopnea and  leg swelling.  Gastrointestinal:  Negative for abdominal pain, blood in stool, constipation, diarrhea, heartburn, melena, nausea and vomiting.  Genitourinary:  Negative for dysuria, frequency and urgency.  Musculoskeletal:  Positive for back pain, joint pain and myalgias.  Skin: Negative.  Negative for itching and rash.  Neurological:  Positive for tingling. Negative for dizziness, focal weakness, weakness and headaches.  Endo/Heme/Allergies:  Does not bruise/bleed easily.  Psychiatric/Behavioral:  Negative for depression. The patient is not nervous/anxious and does not have insomnia.    ALLERGIES:  is allergic to isoniazid and keflex [cephalexin].  MEDICATIONS:  Current Outpatient Medications  Medication Sig Dispense Refill   acetaminophen (TYLENOL) 325 MG tablet Take 650 mg by mouth every 6 (six) hours as needed for fever or headache.     allopurinol (ZYLOPRIM) 300 MG tablet Take 300 mg by mouth daily.      ALPRAZolam (XANAX) 0.25 MG tablet Take 0.25 mg by mouth at bedtime.  0   atenolol (TENORMIN) 50 MG tablet Take 0.5 tablets (25 mg total) by mouth daily. 30 tablet 0   cetirizine (ZYRTEC) 10 MG tablet Take 10 mg by mouth 2 (two) times daily.      DULoxetine (CYMBALTA) 20 MG capsule      fluticasone (FLONASE) 50 MCG/ACT nasal spray Place 2 sprays into the nose daily.      hydroxychloroquine (PLAQUENIL) 200 MG tablet daily.     lansoprazole (PREVACID) 30 MG capsule Take 30 mg by mouth daily at 12 noon.     leflunomide (ARAVA) 10 MG tablet Take 1 tablet by mouth daily.     levothyroxine (SYNTHROID) 88 MCG tablet Take by  mouth.     ondansetron (ZOFRAN) 8 MG tablet TAKE 1 TABLET BY MOUTH EVERY 8 HOURS AS NEEDED FOR NAUSEA OR VOMITING 40 tablet 1   oxybutynin (DITROPAN-XL) 10 MG 24 hr tablet Take 1 tablet (10 mg total) by mouth daily. 30 tablet 11   rOPINIRole (REQUIP) 0.5 MG tablet Take by mouth.     triamterene-hydrochlorothiazide (MAXZIDE-25) 37.5-25 MG tablet TK 1 T PO PRN      clonazePAM (KLONOPIN) 0.5 MG tablet Take by mouth. (Patient not taking: No sig reported)     denosumab (PROLIA) 60 MG/ML SOSY injection Inject into the skin. (Patient not taking: Reported on 05/07/2021)     levothyroxine (SYNTHROID, LEVOTHROID) 75 MCG tablet Take 75 mcg by mouth daily before breakfast.  (Patient not taking: Reported on 05/07/2021)  5   vitamin B-12 (CYANOCOBALAMIN) 1000 MCG tablet Take 1,000 mcg by mouth daily. (Patient not taking: Reported on 05/07/2021)     No current facility-administered medications for this visit.    PHYSICAL EXAMINATION: ECOG PERFORMANCE STATUS: 0 - Asymptomatic  BP 132/67 (BP Location: Left Arm, Patient Position: Sitting, Cuff Size: Normal)    Pulse 80    Temp 98.3 F (36.8 C) (Temporal)    Wt 178 lb 3.2 oz (80.8 kg)    SpO2 96%    BMI 29.65 kg/m   Filed Weights   05/07/21 1450  Weight: 178 lb 3.2 oz (80.8 kg)    Physical Exam HENT:     Head: Normocephalic and atraumatic.     Mouth/Throat:     Pharynx: No oropharyngeal exudate.  Eyes:     Pupils: Pupils are equal, round, and reactive to light.  Cardiovascular:     Rate and Rhythm: Normal rate and regular rhythm.  Pulmonary:     Effort: No respiratory distress.     Breath sounds: No wheezing.  Abdominal:     General: Bowel sounds are normal. There is no distension.     Palpations: Abdomen is soft. There is no mass.     Tenderness: There is no abdominal tenderness. There is no guarding or rebound.  Musculoskeletal:        General: No tenderness. Normal range of motion.     Cervical back: Normal range of motion and neck supple.  Skin:    General: Skin is warm.  Neurological:     Mental Status: She is alert and oriented to person, place, and time.  Psychiatric:        Mood and Affect: Affect normal.    LABORATORY DATA:  I have reviewed the data as listed    Component Value Date/Time   NA 134 (L) 05/01/2021 1120   NA 142 08/16/2013 1028   K 3.9 05/01/2021 1120   K 4.5  08/16/2013 1028   CL 104 05/01/2021 1120   CL 106 08/16/2013 1028   CO2 21 (L) 05/01/2021 1120   CO2 27 08/16/2013 1028   GLUCOSE 100 (H) 05/01/2021 1120   GLUCOSE 99 08/16/2013 1028   BUN 33 (H) 05/01/2021 1120   BUN 21 (H) 08/16/2013 1028   CREATININE 2.21 (H) 05/01/2021 1120   CREATININE 1.42 (H) 09/07/2014 0954   CALCIUM 9.8 05/01/2021 1120   CALCIUM 9.3 09/07/2014 0954   PROT 7.4 05/01/2021 1120   PROT 7.4 08/16/2013 1028   ALBUMIN 3.9 05/01/2021 1120   ALBUMIN 3.9 08/16/2013 1028   AST 25 05/01/2021 1120   AST 27 08/16/2013 1028   ALT 9 05/01/2021 1120   ALT 15  08/16/2013 1028   ALKPHOS 78 05/01/2021 1120   ALKPHOS 61 08/16/2013 1028   BILITOT 0.3 05/01/2021 1120   BILITOT 0.3 08/16/2013 1028   GFRNONAA 22 (L) 05/01/2021 1120   GFRNONAA 36 (L) 09/07/2014 0954   GFRAA 19 (L) 02/09/2020 1001   GFRAA 42 (L) 09/07/2014 0954    No results found for: SPEP, UPEP  Lab Results  Component Value Date   WBC 8.5 05/01/2021   NEUTROABS 6.3 05/01/2021   HGB 11.0 (L) 05/01/2021   HCT 34.1 (L) 05/01/2021   MCV 98.3 05/01/2021   PLT 244 05/01/2021      Chemistry      Component Value Date/Time   NA 134 (L) 05/01/2021 1120   NA 142 08/16/2013 1028   K 3.9 05/01/2021 1120   K 4.5 08/16/2013 1028   CL 104 05/01/2021 1120   CL 106 08/16/2013 1028   CO2 21 (L) 05/01/2021 1120   CO2 27 08/16/2013 1028   BUN 33 (H) 05/01/2021 1120   BUN 21 (H) 08/16/2013 1028   CREATININE 2.21 (H) 05/01/2021 1120   CREATININE 1.42 (H) 09/07/2014 0954      Component Value Date/Time   CALCIUM 9.8 05/01/2021 1120   CALCIUM 9.3 09/07/2014 0954   ALKPHOS 78 05/01/2021 1120   ALKPHOS 61 08/16/2013 1028   AST 25 05/01/2021 1120   AST 27 08/16/2013 1028   ALT 9 05/01/2021 1120   ALT 15 08/16/2013 1028   BILITOT 0.3 05/01/2021 1120   BILITOT 0.3 08/16/2013 1028       RADIOGRAPHIC STUDIES: I have personally reviewed the radiological images as listed and agreed with the findings in the  report. No results found.   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  Kappa lambda light chain ratio= overall stable[however kappa light chain 198]-stable renal function stable hemoglobin.  Monitor for now.   # CKD stage IVcreatinine-creatinine 2.43/GFR-22-overall stable discussed with Dr. Candiss Norse.  Continue close monitoring.] -STABLE  #Hyperkalemia potassium 3.9-STABLE;  Monitor for now.  Off Aldactone  # RA- [on Plaquenil]- STABLE.  #DISPOSITION:  # follow up 6 Months MD; 2 week prior labs- cbc/cmp; LDH; MM panel; K/L light chains-  Dr.B  Cc; Dr.Hande/ Dr.Kernodle      Cammie Sickle, MD 05/07/2021 3:25 PM

## 2021-05-08 LAB — MULTIPLE MYELOMA PANEL, SERUM
Albumin SerPl Elph-Mcnc: 3.8 g/dL (ref 2.9–4.4)
Albumin/Glob SerPl: 1.3 (ref 0.7–1.7)
Alpha 1: 0.3 g/dL (ref 0.0–0.4)
Alpha2 Glob SerPl Elph-Mcnc: 1 g/dL (ref 0.4–1.0)
B-Globulin SerPl Elph-Mcnc: 0.9 g/dL (ref 0.7–1.3)
Gamma Glob SerPl Elph-Mcnc: 0.9 g/dL (ref 0.4–1.8)
Globulin, Total: 3.1 g/dL (ref 2.2–3.9)
IgA: 60 mg/dL — ABNORMAL LOW (ref 64–422)
IgG (Immunoglobin G), Serum: 958 mg/dL (ref 586–1602)
IgM (Immunoglobulin M), Srm: 45 mg/dL (ref 26–217)
M Protein SerPl Elph-Mcnc: 0.5 g/dL — ABNORMAL HIGH
Total Protein ELP: 6.9 g/dL (ref 6.0–8.5)

## 2021-08-21 ENCOUNTER — Other Ambulatory Visit: Payer: Self-pay | Admitting: Nurse Practitioner

## 2021-08-29 ENCOUNTER — Other Ambulatory Visit: Payer: Self-pay | Admitting: Nurse Practitioner

## 2021-09-08 ENCOUNTER — Ambulatory Visit
Admission: RE | Admit: 2021-09-08 | Discharge: 2021-09-08 | Disposition: A | Discharge status: Home / Self Care | Payer: Medicare Other | Source: Ambulatory Visit | Attending: Internal Medicine | Admitting: Internal Medicine

## 2021-09-08 ENCOUNTER — Other Ambulatory Visit: Payer: Self-pay | Admitting: Internal Medicine

## 2021-09-08 DIAGNOSIS — S0592XA Unspecified injury of left eye and orbit, initial encounter: Secondary | ICD-10-CM

## 2021-09-08 DIAGNOSIS — Z9181 History of falling: Secondary | ICD-10-CM | POA: Diagnosis not present

## 2021-09-08 DIAGNOSIS — S060X9A Concussion with loss of consciousness of unspecified duration, initial encounter: Secondary | ICD-10-CM | POA: Diagnosis present

## 2021-10-22 ENCOUNTER — Inpatient Hospital Stay: Payer: Medicare Other | Attending: Internal Medicine

## 2021-10-22 ENCOUNTER — Other Ambulatory Visit: Payer: Self-pay

## 2021-10-22 DIAGNOSIS — G8929 Other chronic pain: Secondary | ICD-10-CM | POA: Diagnosis not present

## 2021-10-22 DIAGNOSIS — E875 Hyperkalemia: Secondary | ICD-10-CM | POA: Insufficient documentation

## 2021-10-22 DIAGNOSIS — C9000 Multiple myeloma not having achieved remission: Secondary | ICD-10-CM | POA: Diagnosis present

## 2021-10-22 DIAGNOSIS — N189 Chronic kidney disease, unspecified: Secondary | ICD-10-CM | POA: Diagnosis not present

## 2021-10-22 DIAGNOSIS — Z7969 Long term (current) use of other immunomodulators and immunosuppressants: Secondary | ICD-10-CM | POA: Diagnosis not present

## 2021-10-22 DIAGNOSIS — M791 Myalgia, unspecified site: Secondary | ICD-10-CM | POA: Insufficient documentation

## 2021-10-22 DIAGNOSIS — Z79899 Other long term (current) drug therapy: Secondary | ICD-10-CM | POA: Diagnosis not present

## 2021-10-22 DIAGNOSIS — F419 Anxiety disorder, unspecified: Secondary | ICD-10-CM | POA: Insufficient documentation

## 2021-10-22 DIAGNOSIS — D649 Anemia, unspecified: Secondary | ICD-10-CM | POA: Diagnosis not present

## 2021-10-22 DIAGNOSIS — Z9481 Bone marrow transplant status: Secondary | ICD-10-CM | POA: Diagnosis not present

## 2021-10-22 DIAGNOSIS — Z9484 Stem cells transplant status: Secondary | ICD-10-CM | POA: Insufficient documentation

## 2021-10-22 DIAGNOSIS — M069 Rheumatoid arthritis, unspecified: Secondary | ICD-10-CM | POA: Insufficient documentation

## 2021-10-22 DIAGNOSIS — R110 Nausea: Secondary | ICD-10-CM | POA: Insufficient documentation

## 2021-10-22 DIAGNOSIS — M2550 Pain in unspecified joint: Secondary | ICD-10-CM | POA: Diagnosis not present

## 2021-10-22 DIAGNOSIS — C9001 Multiple myeloma in remission: Secondary | ICD-10-CM | POA: Diagnosis not present

## 2021-10-22 DIAGNOSIS — R5383 Other fatigue: Secondary | ICD-10-CM | POA: Insufficient documentation

## 2021-10-22 DIAGNOSIS — Z881 Allergy status to other antibiotic agents status: Secondary | ICD-10-CM | POA: Diagnosis not present

## 2021-10-22 DIAGNOSIS — M549 Dorsalgia, unspecified: Secondary | ICD-10-CM | POA: Diagnosis not present

## 2021-10-22 LAB — CBC WITH DIFFERENTIAL/PLATELET
Abs Immature Granulocytes: 0.1 10*3/uL — ABNORMAL HIGH (ref 0.00–0.07)
Basophils Absolute: 0.1 10*3/uL (ref 0.0–0.1)
Basophils Relative: 1 %
Eosinophils Absolute: 0.3 10*3/uL (ref 0.0–0.5)
Eosinophils Relative: 3 %
HCT: 34.2 % — ABNORMAL LOW (ref 36.0–46.0)
Hemoglobin: 10.9 g/dL — ABNORMAL LOW (ref 12.0–15.0)
Immature Granulocytes: 1 %
Lymphocytes Relative: 22 %
Lymphs Abs: 1.8 10*3/uL (ref 0.7–4.0)
MCH: 31.8 pg (ref 26.0–34.0)
MCHC: 31.9 g/dL (ref 30.0–36.0)
MCV: 99.7 fL (ref 80.0–100.0)
Monocytes Absolute: 0.5 10*3/uL (ref 0.1–1.0)
Monocytes Relative: 6 %
Neutro Abs: 5.5 10*3/uL (ref 1.7–7.7)
Neutrophils Relative %: 67 %
Platelets: 211 10*3/uL (ref 150–400)
RBC: 3.43 MIL/uL — ABNORMAL LOW (ref 3.87–5.11)
RDW: 13.9 % (ref 11.5–15.5)
WBC: 8.2 10*3/uL (ref 4.0–10.5)
nRBC: 0 % (ref 0.0–0.2)

## 2021-10-22 LAB — COMPREHENSIVE METABOLIC PANEL
ALT: 7 U/L (ref 0–44)
AST: 26 U/L (ref 15–41)
Albumin: 3.8 g/dL (ref 3.5–5.0)
Alkaline Phosphatase: 66 U/L (ref 38–126)
Anion gap: 9 (ref 5–15)
BUN: 24 mg/dL — ABNORMAL HIGH (ref 8–23)
CO2: 27 mmol/L (ref 22–32)
Calcium: 9.5 mg/dL (ref 8.9–10.3)
Chloride: 100 mmol/L (ref 98–111)
Creatinine, Ser: 2.03 mg/dL — ABNORMAL HIGH (ref 0.44–1.00)
GFR, Estimated: 24 mL/min — ABNORMAL LOW (ref >60)
Glucose, Bld: 98 mg/dL (ref 70–99)
Potassium: 4 mmol/L (ref 3.5–5.1)
Sodium: 136 mmol/L (ref 135–145)
Total Bilirubin: 0.4 mg/dL (ref 0.3–1.2)
Total Protein: 7.2 g/dL (ref 6.5–8.1)

## 2021-10-22 LAB — LACTATE DEHYDROGENASE: LDH: 171 U/L (ref 98–192)

## 2021-10-23 LAB — KAPPA/LAMBDA LIGHT CHAINS
Kappa free light chain: 191.3 mg/L — ABNORMAL HIGH (ref 3.3–19.4)
Kappa, lambda light chain ratio: 8.58 — ABNORMAL HIGH (ref 0.26–1.65)
Lambda free light chains: 22.3 mg/L (ref 5.7–26.3)

## 2021-10-27 ENCOUNTER — Other Ambulatory Visit: Payer: Self-pay | Admitting: *Deleted

## 2021-10-27 LAB — MULTIPLE MYELOMA PANEL, SERUM
Albumin SerPl Elph-Mcnc: 3.5 g/dL (ref 2.9–4.4)
Albumin/Glob SerPl: 1.3 (ref 0.7–1.7)
Alpha 1: 0.3 g/dL (ref 0.0–0.4)
Alpha2 Glob SerPl Elph-Mcnc: 1 g/dL (ref 0.4–1.0)
B-Globulin SerPl Elph-Mcnc: 0.8 g/dL (ref 0.7–1.3)
Gamma Glob SerPl Elph-Mcnc: 0.8 g/dL (ref 0.4–1.8)
Globulin, Total: 2.9 g/dL (ref 2.2–3.9)
IgA: 50 mg/dL — ABNORMAL LOW (ref 64–422)
IgG (Immunoglobin G), Serum: 903 mg/dL (ref 586–1602)
IgM (Immunoglobulin M), Srm: 30 mg/dL (ref 26–217)
M Protein SerPl Elph-Mcnc: 0.4 g/dL — ABNORMAL HIGH
Total Protein ELP: 6.4 g/dL (ref 6.0–8.5)

## 2021-10-27 MED ORDER — ONDANSETRON HCL 8 MG PO TABS
ORAL_TABLET | ORAL | 1 refills | Status: DC
Start: 2021-10-27 — End: 2022-04-27

## 2021-11-05 ENCOUNTER — Inpatient Hospital Stay (HOSPITAL_BASED_OUTPATIENT_CLINIC_OR_DEPARTMENT_OTHER): Payer: Medicare Other | Admitting: Internal Medicine

## 2021-11-05 ENCOUNTER — Encounter: Payer: Self-pay | Admitting: Internal Medicine

## 2021-11-05 DIAGNOSIS — C9000 Multiple myeloma not having achieved remission: Secondary | ICD-10-CM | POA: Diagnosis not present

## 2021-11-05 MED ORDER — ALPRAZOLAM 0.25 MG PO TABS
0.2500 mg | ORAL_TABLET | Freq: Every evening | ORAL | 0 refills | Status: DC | PRN
Start: 2021-11-05 — End: 2022-04-24

## 2021-11-05 NOTE — Progress Notes (Signed)
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  No evidence of organ dysfunction; continue close surveillance.  # CKD [~ creat 1.35- 1.7] ; Dr.Singh  # 2012- LEFT KIDNEY ? 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 benign   Multiple myeloma not having achieved remission ([place])  12/18/2015 Initial Diagnosis   Multiple myeloma in remission ([place])      INTERVAL HISTORY: Ambulating independently.  Alone.   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  Intermittently patient feels nauseous which is anxious unable to sleep at night.  She ran out of her Xanax prescription.  Denies any worsening tingling or numbness.  Denies any Vomiting denies any fevers or chills.   Denies any worsening back pain.  Otherwise no chest pain or shortness of breath or cough.  Review of Systems  Constitutional:  Positive for malaise/fatigue. Negative for diaphoresis, fever and weight loss.       Cold intolerance.  HENT:  Negative for nosebleeds and sore throat.   Eyes:  Negative for double vision.  Respiratory:  Negative for cough, hemoptysis, sputum production, shortness of breath and  wheezing.   Cardiovascular:  Negative for chest pain, palpitations, orthopnea and leg swelling.  Gastrointestinal:  Negative for abdominal pain, blood in stool, constipation, diarrhea, heartburn, melena, nausea and vomiting.  Genitourinary:  Negative for dysuria, frequency and urgency.  Musculoskeletal:  Positive for back pain, joint pain and myalgias.  Skin: Negative.  Negative for itching and rash.  Neurological:  Positive for tingling. Negative for dizziness, focal weakness, weakness and headaches.  Endo/Heme/Allergies:  Does not bruise/bleed easily.  Psychiatric/Behavioral:  Negative for depression. The patient is not nervous/anxious and does not have insomnia.     ALLERGIES:  is allergic to isoniazid and keflex [cephalexin].  MEDICATIONS:  Current Outpatient Medications  Medication Sig Dispense Refill   acetaminophen (TYLENOL) 325 MG tablet Take 650 mg by mouth every 6 (six) hours as needed for fever or headache.     allopurinol (ZYLOPRIM) 300 MG tablet Take 300 mg by mouth daily.      atenolol (TENORMIN) 50 MG tablet Take 0.5 tablets (25 mg total) by mouth daily. 30 tablet 0   cetirizine (ZYRTEC) 10 MG tablet Take 10 mg by mouth 2 (two) times daily.      DULoxetine (CYMBALTA) 20 MG capsule      fluticasone (FLONASE) 50 MCG/ACT nasal spray Place 2 sprays into the nose daily.      hydroxychloroquine (PLAQUENIL) 200 MG tablet daily.     lansoprazole (PREVACID) 30 MG capsule Take 30 mg by mouth daily at 12 noon.     leflunomide (ARAVA) 10 MG tablet Take 1 tablet by mouth daily.     levothyroxine (SYNTHROID) 88 MCG tablet Take by mouth.  ondansetron (ZOFRAN) 8 MG tablet TAKE 1 TABLET BY MOUTH EVERY 8 HOURS AS NEEDED FOR NAUSEA OR VOMITING 40 tablet 1   oxybutynin (DITROPAN-XL) 10 MG 24 hr tablet Take 1 tablet (10 mg total) by mouth daily. 30 tablet 11   rOPINIRole (REQUIP) 0.5 MG tablet Take by mouth.     triamterene-hydrochlorothiazide (MAXZIDE-25) 37.5-25 MG tablet TK 1 T PO PRN      vitamin B-12 (CYANOCOBALAMIN) 1000 MCG tablet Take 1,000 mcg by mouth daily.     ALPRAZolam (XANAX) 0.25 MG tablet Take 1 tablet (0.25 mg total) by mouth at bedtime as needed for anxiety. 30 tablet 0   clonazePAM (KLONOPIN) 0.5 MG tablet Take by mouth. (Patient not taking: No sig reported)     denosumab (PROLIA) 60 MG/ML SOSY injection Inject into the skin. (Patient not taking: Reported on 05/07/2021)     No current facility-administered medications for this visit.    PHYSICAL EXAMINATION: ECOG PERFORMANCE STATUS: 0 - Asymptomatic  BP 139/62 (BP Location: Left Arm, Patient Position: Sitting, Cuff Size: Normal)   Pulse 69   Temp 98.7 F (37.1 C) (Tympanic)   Ht _0  (1.651 m)   Wt 167 lb 12.8 oz (76.1 kg)   SpO2 98%   BMI 27.92 kg/m   Filed Weights   11/05/21 1430  Weight: 167 lb 12.8 oz (76.1 kg)    Physical Exam HENT:     Head: Normocephalic and atraumatic.     Mouth/Throat:     Pharynx: No oropharyngeal exudate.  Eyes:     Pupils: Pupils are equal, round, and reactive to light.  Cardiovascular:     Rate and Rhythm: Normal rate and regular rhythm.  Pulmonary:     Effort: No respiratory distress.     Breath sounds: No wheezing.  Abdominal:     General: Bowel sounds are normal. There is no distension.     Palpations: Abdomen is soft. There is no mass.     Tenderness: There is no abdominal tenderness. There is no guarding or rebound.  Musculoskeletal:        General: No tenderness. Normal range of motion.     Cervical back: Normal range of motion and neck supple.  Skin:    General: Skin is warm.  Neurological:     Mental Status: She is alert and oriented to person, place, and time.  Psychiatric:        Mood and Affect: Affect normal.     LABORATORY DATA:  I have reviewed the data as listed    Component Value Date/Time   NA 136 10/22/2021 1324   NA 142 08/16/2013 1028   K 4.0 10/22/2021 1324   K 4.5 08/16/2013 1028   CL 100 10/22/2021 1324   CL 106  08/16/2013 1028   CO2 27 10/22/2021 1324   CO2 27 08/16/2013 1028   GLUCOSE 98 10/22/2021 1324   GLUCOSE 99 08/16/2013 1028   BUN 24 (H) 10/22/2021 1324   BUN 21 (H) 08/16/2013 1028   CREATININE 2.03 (H) 10/22/2021 1324   CREATININE 1.42 (H) 09/07/2014 0954   CALCIUM 9.5 10/22/2021 1324   CALCIUM 9.3 09/07/2014 0954   PROT 7.2 10/22/2021 1324   PROT 7.4 08/16/2013 1028   ALBUMIN 3.8 10/22/2021 1324   ALBUMIN 3.9 08/16/2013 1028   AST 26 10/22/2021 1324   AST 27 08/16/2013 1028   ALT 7 10/22/2021 1324   ALT 15 08/16/2013 1028   ALKPHOS 66 10/22/2021 1324   ALKPHOS 61  08/16/2013 1028   BILITOT 0.4 10/22/2021 1324   BILITOT 0.3 08/16/2013 1028   GFRNONAA 24 (L) 10/22/2021 1324   GFRNONAA 36 (L) 09/07/2014 0954   GFRAA 19 (L) 02/09/2020 1001   GFRAA 42 (L) 09/07/2014 0954    No results found for: "SPEP", "UPEP"  Lab Results  Component Value Date   WBC 8.2 10/22/2021   NEUTROABS 5.5 10/22/2021   HGB 10.9 (L) 10/22/2021   HCT 34.2 (L) 10/22/2021   MCV 99.7 10/22/2021   PLT 211 10/22/2021      Chemistry      Component Value Date/Time   NA 136 10/22/2021 1324   NA 142 08/16/2013 1028   K 4.0 10/22/2021 1324   K 4.5 08/16/2013 1028   CL 100 10/22/2021 1324   CL 106 08/16/2013 1028   CO2 27 10/22/2021 1324   CO2 27 08/16/2013 1028   BUN 24 (H) 10/22/2021 1324   BUN 21 (H) 08/16/2013 1028   CREATININE 2.03 (H) 10/22/2021 1324   CREATININE 1.42 (H) 09/07/2014 0954      Component Value Date/Time   CALCIUM 9.5 10/22/2021 1324   CALCIUM 9.3 09/07/2014 0954   ALKPHOS 66 10/22/2021 1324   ALKPHOS 61 08/16/2013 1028   AST 26 10/22/2021 1324   AST 27 08/16/2013 1028   ALT 7 10/22/2021 1324   ALT 15 08/16/2013 1028   BILITOT 0.4 10/22/2021 1324   BILITOT 0.3 08/16/2013 1028       RADIOGRAPHIC STUDIES: I have personally reviewed the radiological images as listed and agreed with the findings in the report. No results found.   ASSESSMENT & PLAN:    Multiple  myeloma not having achieved remission ([place]) # Multiple myeloma-Dx: 2012 s/p autologous stem cell transplant in May 2013; OCT 2021- BMBx- plasma cells arranged in small clusters and aggregates [10% of total marrow cellularity]   #M protein [AUG 2022] 0.4; kappa lambda light chain ratio=8 [RISING]; Today overall stable renal function; mild anemia Hb 10.  Given increase in kappa lambda light chain ratio recommend monitoring more closely/in 4 months.  # CKD stage IVcreatinine-creatinine 2.43/GFR-24-overall-STABLE;   Continue close monitoring -STABLE  #Hyperkalemia potassium 3.9-STABLE;  Monitor for now.  Off Aldactone  # RA- [on Plaquenil]- STABLE.  # Anxiety/nausea- prescribed/refilled  xanax 0.25 mg qhs [further refills to PCP]  #DISPOSITION:  # follow up 4 Months MD; 2 week prior labs- cbc/cmp; LDH; iron studies/ferritin;  MM panel; K/L light chains-  Dr.B  Cc; Dr.Hande/ Dr.Kern[facility]le      Cammie Sickle, MD 11/05/2021 3:39 PM

## 2021-11-05 NOTE — Assessment & Plan Note (Addendum)
#  Multiple myeloma-Dx: 2012 s/p autologous stem cell transplant in May 2013; OCT 2021- BMBx- plasma cells arranged in small clusters and aggregates [10% of total marrow cellularity]   #M protein [AUG 2022] 0.4; kappa lambda light chain ratio=8 [RISING]; Today overall stable renal function; mild anemia Hb 10.  Given increase in kappa lambda light chain ratio recommend monitoring more closely/in 4 months.  # CKD stage IVcreatinine-creatinine 2.43/GFR-24-overall-STABLE;   Continue close monitoring -STABLE  #Hyperkalemia potassium 3.9-STABLE;  Monitor for now.  Off Aldactone  # RA- [on Plaquenil]- STABLE.  # Anxiety/nausea- prescribed/refilled  xanax 0.25 mg qhs [further refills to PCP]  #DISPOSITION:  # follow up 4 Months MD; 2 week prior labs- cbc/cmp; LDH; iron studies/ferritin;  MM panel; K/L light chains-  Dr.B  Cc; Dr.Hande/ Dr.Kernodle

## 2022-02-25 ENCOUNTER — Inpatient Hospital Stay: Payer: Medicare Other | Attending: Internal Medicine

## 2022-02-25 DIAGNOSIS — C9000 Multiple myeloma not having achieved remission: Secondary | ICD-10-CM | POA: Diagnosis not present

## 2022-02-25 LAB — CBC WITH DIFFERENTIAL/PLATELET
Abs Immature Granulocytes: 0.38 10*3/uL — ABNORMAL HIGH (ref 0.00–0.07)
Basophils Absolute: 0.1 10*3/uL (ref 0.0–0.1)
Basophils Relative: 1 %
Eosinophils Absolute: 0.1 10*3/uL (ref 0.0–0.5)
Eosinophils Relative: 1 %
HCT: 35 % — ABNORMAL LOW (ref 36.0–46.0)
Hemoglobin: 11 g/dL — ABNORMAL LOW (ref 12.0–15.0)
Immature Granulocytes: 4 %
Lymphocytes Relative: 24 %
Lymphs Abs: 2.6 10*3/uL (ref 0.7–4.0)
MCH: 31.3 pg (ref 26.0–34.0)
MCHC: 31.4 g/dL (ref 30.0–36.0)
MCV: 99.7 fL (ref 80.0–100.0)
Monocytes Absolute: 0.7 10*3/uL (ref 0.1–1.0)
Monocytes Relative: 7 %
Neutro Abs: 6.9 10*3/uL (ref 1.7–7.7)
Neutrophils Relative %: 63 %
Platelets: 295 10*3/uL (ref 150–400)
RBC: 3.51 MIL/uL — ABNORMAL LOW (ref 3.87–5.11)
RDW: 14.4 % (ref 11.5–15.5)
WBC: 10.8 10*3/uL — ABNORMAL HIGH (ref 4.0–10.5)
nRBC: 0.3 % — ABNORMAL HIGH (ref 0.0–0.2)

## 2022-02-25 LAB — COMPREHENSIVE METABOLIC PANEL
ALT: 9 U/L (ref 0–44)
AST: 29 U/L (ref 15–41)
Albumin: 4.1 g/dL (ref 3.5–5.0)
Alkaline Phosphatase: 69 U/L (ref 38–126)
Anion gap: 11 (ref 5–15)
BUN: 38 mg/dL — ABNORMAL HIGH (ref 8–23)
CO2: 18 mmol/L — ABNORMAL LOW (ref 22–32)
Calcium: 10.2 mg/dL (ref 8.9–10.3)
Chloride: 105 mmol/L (ref 98–111)
Creatinine, Ser: 2.53 mg/dL — ABNORMAL HIGH (ref 0.44–1.00)
GFR, Estimated: 19 mL/min — ABNORMAL LOW (ref >60)
Glucose, Bld: 111 mg/dL — ABNORMAL HIGH (ref 70–99)
Potassium: 4.1 mmol/L (ref 3.5–5.1)
Sodium: 134 mmol/L — ABNORMAL LOW (ref 135–145)
Total Bilirubin: 0.4 mg/dL (ref 0.3–1.2)
Total Protein: 7.6 g/dL (ref 6.5–8.1)

## 2022-02-25 LAB — FERRITIN: Ferritin: 143 ng/mL (ref 11–307)

## 2022-02-25 LAB — IRON AND TIBC
Iron: 91 ug/dL (ref 28–170)
Saturation Ratios: 32 % — ABNORMAL HIGH (ref 10.4–31.8)
TIBC: 287 ug/dL (ref 250–450)
UIBC: 196 ug/dL

## 2022-02-25 LAB — LACTATE DEHYDROGENASE: LDH: 181 U/L (ref 98–192)

## 2022-02-27 LAB — KAPPA/LAMBDA LIGHT CHAINS
Kappa free light chain: 329.4 mg/L — ABNORMAL HIGH (ref 3.3–19.4)
Kappa, lambda light chain ratio: 13.23 — ABNORMAL HIGH (ref 0.26–1.65)
Lambda free light chains: 24.9 mg/L (ref 5.7–26.3)

## 2022-03-02 LAB — MULTIPLE MYELOMA PANEL, SERUM
Albumin SerPl Elph-Mcnc: 3.7 g/dL (ref 2.9–4.4)
Albumin/Glob SerPl: 1.3 (ref 0.7–1.7)
Alpha 1: 0.3 g/dL (ref 0.0–0.4)
Alpha2 Glob SerPl Elph-Mcnc: 0.9 g/dL (ref 0.4–1.0)
B-Globulin SerPl Elph-Mcnc: 0.8 g/dL (ref 0.7–1.3)
Gamma Glob SerPl Elph-Mcnc: 0.8 g/dL (ref 0.4–1.8)
Globulin, Total: 2.9 g/dL (ref 2.2–3.9)
IgA: 46 mg/dL — ABNORMAL LOW (ref 64–422)
IgG (Immunoglobin G), Serum: 971 mg/dL (ref 586–1602)
IgM (Immunoglobulin M), Srm: 25 mg/dL — ABNORMAL LOW (ref 26–217)
M Protein SerPl Elph-Mcnc: 0.7 g/dL — ABNORMAL HIGH
Total Protein ELP: 6.6 g/dL (ref 6.0–8.5)

## 2022-03-11 ENCOUNTER — Inpatient Hospital Stay: Payer: Medicare Other | Admitting: Internal Medicine

## 2022-03-18 ENCOUNTER — Inpatient Hospital Stay: Payer: Medicare Other | Attending: Internal Medicine | Admitting: Medical Oncology

## 2022-03-18 ENCOUNTER — Encounter: Payer: Self-pay | Admitting: Medical Oncology

## 2022-03-18 ENCOUNTER — Inpatient Hospital Stay: Payer: Medicare Other | Admitting: Internal Medicine

## 2022-03-18 VITALS — BP 121/65 | HR 93 | Temp 98.5°F | Resp 16 | Wt 155.0 lb

## 2022-03-18 DIAGNOSIS — M2550 Pain in unspecified joint: Secondary | ICD-10-CM | POA: Diagnosis not present

## 2022-03-18 DIAGNOSIS — M069 Rheumatoid arthritis, unspecified: Secondary | ICD-10-CM | POA: Diagnosis not present

## 2022-03-18 DIAGNOSIS — Z79899 Other long term (current) drug therapy: Secondary | ICD-10-CM | POA: Diagnosis not present

## 2022-03-18 DIAGNOSIS — R5383 Other fatigue: Secondary | ICD-10-CM | POA: Diagnosis not present

## 2022-03-18 DIAGNOSIS — E875 Hyperkalemia: Secondary | ICD-10-CM | POA: Diagnosis not present

## 2022-03-18 DIAGNOSIS — Z9481 Bone marrow transplant status: Secondary | ICD-10-CM | POA: Insufficient documentation

## 2022-03-18 DIAGNOSIS — Z9484 Stem cells transplant status: Secondary | ICD-10-CM | POA: Insufficient documentation

## 2022-03-18 DIAGNOSIS — C9000 Multiple myeloma not having achieved remission: Secondary | ICD-10-CM

## 2022-03-18 DIAGNOSIS — Z7969 Long term (current) use of other immunomodulators and immunosuppressants: Secondary | ICD-10-CM | POA: Diagnosis not present

## 2022-03-18 DIAGNOSIS — Z881 Allergy status to other antibiotic agents status: Secondary | ICD-10-CM | POA: Insufficient documentation

## 2022-03-18 DIAGNOSIS — N184 Chronic kidney disease, stage 4 (severe): Secondary | ICD-10-CM | POA: Diagnosis not present

## 2022-03-18 DIAGNOSIS — M791 Myalgia, unspecified site: Secondary | ICD-10-CM | POA: Diagnosis not present

## 2022-03-18 DIAGNOSIS — C9001 Multiple myeloma in remission: Secondary | ICD-10-CM | POA: Insufficient documentation

## 2022-03-18 DIAGNOSIS — M549 Dorsalgia, unspecified: Secondary | ICD-10-CM | POA: Insufficient documentation

## 2022-03-18 DIAGNOSIS — R7402 Elevation of levels of lactic acid dehydrogenase (LDH): Secondary | ICD-10-CM | POA: Diagnosis not present

## 2022-03-18 NOTE — Progress Notes (Signed)
Pt in for follow up, denies any concerns today. 

## 2022-03-18 NOTE — Progress Notes (Signed)
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  No evidence of organ dysfunction; continue close surveillance.  # CKD [~ creat 1.35- 1.7] ; Dr.Singh  # 2012- LEFT KIDNEY ? 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 benign   Multiple myeloma not having achieved remission ([place])  12/18/2015 Initial Diagnosis   Multiple myeloma in remission ([place])      INTERVAL HISTORY: Ambulating independently.  Alone.   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  At her last visit on 11/05/2021 her M protein and kappa light chains were rising a bit so her follow up interval was switched from every 6 months to every 4 months. Today she reports that overall she is feeling well. Still has chronic fatigue from time to time. Healing from recent right thumb joint replacement surgery. Also has been getting dental work completed so she has not been able to eat much- she reports this is why she has lost weight.   Otherwise no chest pain or shortness of breath or cough. No bleeding/bruising, illness, night sweats.   Wt Readings from Last 3 Encounters:  03/18/22 155 lb (70.3 kg)  11/05/21 167 lb 12.8 oz  (76.1 kg)  05/07/21 178 lb 3.2 oz (80.8 kg)     Review of Systems  Constitutional:  Positive for malaise/fatigue. Negative for diaphoresis, fever and weight loss.       Cold intolerance.  HENT:  Negative for nosebleeds and sore throat.   Eyes:  Negative for double vision.  Respiratory:  Negative for cough, hemoptysis, sputum production, shortness of breath and wheezing.   Cardiovascular:  Negative for chest pain, palpitations, orthopnea and leg swelling.  Gastrointestinal:  Negative for abdominal pain, blood in stool, constipation, diarrhea, heartburn, melena, nausea and vomiting.  Genitourinary:  Negative for dysuria, frequency and urgency.  Musculoskeletal:  Positive for back pain, joint pain and myalgias.  Skin: Negative.  Negative for itching and rash.  Neurological:  Positive for tingling. Negative for dizziness, focal weakness, weakness and headaches.  Endo/Heme/Allergies:  Does not bruise/bleed easily.  Psychiatric/Behavioral:  Negative for depression. The patient is not nervous/anxious and does not have insomnia.     ALLERGIES:  is allergic to isoniazid and keflex [cephalexin].  MEDICATIONS:  Current Outpatient Medications  Medication Sig Dispense Refill   acetaminophen (TYLENOL) 325 MG tablet Take 650 mg by mouth every 6 (six) hours as needed for fever or headache.     allopurinol (ZYLOPRIM) 300 MG tablet Take 300 mg by mouth daily.      ALPRAZolam (XANAX) 0.25 MG tablet Take 1 tablet (0.25 mg total) by mouth at bedtime as needed for anxiety. 30 tablet 0   atenolol (TENORMIN) 50 MG tablet Take 0.5 tablets (25 mg total) by mouth daily. 30 tablet 0  cetirizine (ZYRTEC) 10 MG tablet Take 10 mg by mouth 2 (two) times daily.      DULoxetine (CYMBALTA) 20 MG capsule      ferrous sulfate 324 MG TBEC Take 324 mg by mouth.     fluticasone (FLONASE) 50 MCG/ACT nasal spray Place 2 sprays into the nose daily.      hydroxychloroquine (PLAQUENIL) 200 MG tablet daily.     lansoprazole  (PREVACID) 30 MG capsule Take 30 mg by mouth daily at 12 noon.     leflunomide (ARAVA) 10 MG tablet Take 1 tablet by mouth daily.     ondansetron (ZOFRAN) 8 MG tablet TAKE 1 TABLET BY MOUTH EVERY 8 HOURS AS NEEDED FOR NAUSEA OR VOMITING 40 tablet 1   oxybutynin (DITROPAN-XL) 10 MG 24 hr tablet Take 1 tablet (10 mg total) by mouth daily. 30 tablet 11   rOPINIRole (REQUIP) 0.5 MG tablet Take by mouth.     triamterene-hydrochlorothiazide (MAXZIDE-25) 37.5-25 MG tablet TK 1 T PO PRN     vitamin B-12 (CYANOCOBALAMIN) 1000 MCG tablet Take 1,000 mcg by mouth daily.     denosumab (PROLIA) 60 MG/ML SOSY injection Inject into the skin. (Patient not taking: Reported on 05/07/2021)     No current facility-administered medications for this visit.    PHYSICAL EXAMINATION: ECOG PERFORMANCE STATUS: 0 - Asymptomatic  BP 121/65 (BP Location: Left Arm, Patient Position: Sitting)   Pulse 93   Temp 98.5 F (36.9 C) (Tympanic)   Resp 16   Wt 155 lb (70.3 kg)   SpO2 94%   BMI 25.79 kg/m   Filed Weights   03/18/22 1447  Weight: 155 lb (70.3 kg)    Physical Exam HENT:     Head: Normocephalic and atraumatic.     Mouth/Throat:     Pharynx: No oropharyngeal exudate.  Eyes:     Pupils: Pupils are equal, round, and reactive to light.  Cardiovascular:     Rate and Rhythm: Normal rate and regular rhythm.  Pulmonary:     Effort: No respiratory distress.     Breath sounds: No wheezing.  Abdominal:     General: Bowel sounds are normal. There is no distension.     Palpations: Abdomen is soft. There is no mass.     Tenderness: There is no abdominal tenderness. There is no guarding or rebound.  Musculoskeletal:        General: No tenderness. Normal range of motion.     Cervical back: Normal range of motion and neck supple.  Skin:    General: Skin is warm.  Neurological:     Mental Status: She is alert and oriented to person, place, and time.  Psychiatric:        Mood and Affect: Affect normal.      LABORATORY DATA:  I have reviewed the data as listed    Component Value Date/Time   NA 134 (L) 02/25/2022 1112   NA 142 08/16/2013 1028   K 4.1 02/25/2022 1112   K 4.5 08/16/2013 1028   CL 105 02/25/2022 1112   CL 106 08/16/2013 1028   CO2 18 (L) 02/25/2022 1112   CO2 27 08/16/2013 1028   GLUCOSE 111 (H) 02/25/2022 1112   GLUCOSE 99 08/16/2013 1028   BUN 38 (H) 02/25/2022 1112   BUN 21 (H) 08/16/2013 1028   CREATININE 2.53 (H) 02/25/2022 1112   CREATININE 1.42 (H) 09/07/2014 0954   CALCIUM 10.2 02/25/2022 1112   CALCIUM 9.3 09/07/2014 0954   PROT  7.6 02/25/2022 1112   PROT 7.4 08/16/2013 1028   ALBUMIN 4.1 02/25/2022 1112   ALBUMIN 3.9 08/16/2013 1028   AST 29 02/25/2022 1112   AST 27 08/16/2013 1028   ALT 9 02/25/2022 1112   ALT 15 08/16/2013 1028   ALKPHOS 69 02/25/2022 1112   ALKPHOS 61 08/16/2013 1028   BILITOT 0.4 02/25/2022 1112   BILITOT 0.3 08/16/2013 1028   GFRNONAA 19 (L) 02/25/2022 1112   GFRNONAA 36 (L) 09/07/2014 0954   GFRAA 19 (L) 02/09/2020 1001   GFRAA 42 (L) 09/07/2014 0954    No results found for: "SPEP", "UPEP"  Lab Results  Component Value Date   WBC 10.8 (H) 02/25/2022   NEUTROABS 6.9 02/25/2022   HGB 11.0 (L) 02/25/2022   HCT 35.0 (L) 02/25/2022   MCV 99.7 02/25/2022   PLT 295 02/25/2022      Chemistry      Component Value Date/Time   NA 134 (L) 02/25/2022 1112   NA 142 08/16/2013 1028   K 4.1 02/25/2022 1112   K 4.5 08/16/2013 1028   CL 105 02/25/2022 1112   CL 106 08/16/2013 1028   CO2 18 (L) 02/25/2022 1112   CO2 27 08/16/2013 1028   BUN 38 (H) 02/25/2022 1112   BUN 21 (H) 08/16/2013 1028   CREATININE 2.53 (H) 02/25/2022 1112   CREATININE 1.42 (H) 09/07/2014 0954      Component Value Date/Time   CALCIUM 10.2 02/25/2022 1112   CALCIUM 9.3 09/07/2014 0954   ALKPHOS 69 02/25/2022 1112   ALKPHOS 61 08/16/2013 1028   AST 29 02/25/2022 1112   AST 27 08/16/2013 1028   ALT 9 02/25/2022 1112   ALT 15 08/16/2013 1028    BILITOT 0.4 02/25/2022 1112   BILITOT 0.3 08/16/2013 1028       RADIOGRAPHIC STUDIES: I have personally reviewed the radiological images as listed and agreed with the findings in the report. No results found.   ASSESSMENT & PLAN:   Multiple myeloma not having achieved remission ([place]) # Multiple myeloma-Dx: 2012 s/p autologous stem cell transplant in May 2013; OCT 2021- BMBx- plasma cells arranged in small clusters and aggregates [10% of total marrow cellularity]    Today her hemoglobin is stable at 11, WBC is a bit elevated at 10.8, platelets are normal at 295. LDH is a bit elevated at 181 from 171. M protein has increased from 0.4 to 0.7. IgA is stable at 46. Kappa light chains have increased from 191.3 to 329.4 and light chain ration has increased to 13.23. In addition her creatine has worsened from 2.03 to 2.53- see below. Iron storage labs were normal with a ferritin of 143. She will need a bone marrow biopsy and bone survey    # CKD stage IV. Creatinine at last lab draw on 02/25/2022 was 2.53 which is overall stable for her 2.03-2.65 over the past year but elevated from her previous blood work on file from 10/22/2021   #Hyperkalemia potassium - resolved off of aldactone. Will continue to monitor.    # RA- [on Plaquenil]- STABLE.No changes to plan today.      #DISPOSITION:  Metastatic Bone Survey Bone Marrow Biopsy  RTC around 3-4 weeks MD, cbc/cmp; LDH; iron studies/ferritin;  MM panel; K/L light chains    Hughie Closs, PA-C 03/18/2022 2:52 PM

## 2022-03-19 ENCOUNTER — Telehealth: Payer: Self-pay | Admitting: *Deleted

## 2022-03-19 NOTE — Telephone Encounter (Signed)
Call placed to patient to review appointment details for upcoming bone marrow biopsy. Patient scheduled for 11/21 at 8:30 am, patient aware to arrive in medical mall at 7:30 am for procedure. Encouraged to call with any questions or concerns.

## 2022-03-30 ENCOUNTER — Other Ambulatory Visit (HOSPITAL_COMMUNITY): Payer: Self-pay | Admitting: Student

## 2022-03-30 DIAGNOSIS — C9000 Multiple myeloma not having achieved remission: Secondary | ICD-10-CM

## 2022-03-30 NOTE — Progress Notes (Signed)
Patient for Bone Marrow Biopsy on Tuesday 03/31/2022, I called and LM for the patient on the  phone and gave pre-procedure instructions. Pt was made aware to be here at 7:30a at the new entrance, NPO after MN prior to procedure as well as driver post procedure/recovery/discharge. LM on 03/30/2022

## 2022-03-31 ENCOUNTER — Ambulatory Visit
Admission: RE | Admit: 2022-03-31 | Discharge: 2022-03-31 | Disposition: A | Discharge status: Home / Self Care | Payer: Medicare Other | Source: Ambulatory Visit | Attending: Medical Oncology | Admitting: Medical Oncology

## 2022-03-31 DIAGNOSIS — R42 Dizziness and giddiness: Secondary | ICD-10-CM | POA: Insufficient documentation

## 2022-03-31 DIAGNOSIS — K2270 Barrett's esophagus without dysplasia: Secondary | ICD-10-CM | POA: Insufficient documentation

## 2022-03-31 DIAGNOSIS — D649 Anemia, unspecified: Secondary | ICD-10-CM | POA: Diagnosis not present

## 2022-03-31 DIAGNOSIS — K219 Gastro-esophageal reflux disease without esophagitis: Secondary | ICD-10-CM | POA: Insufficient documentation

## 2022-03-31 DIAGNOSIS — C9000 Multiple myeloma not having achieved remission: Secondary | ICD-10-CM | POA: Diagnosis present

## 2022-03-31 DIAGNOSIS — Z1379 Encounter for other screening for genetic and chromosomal anomalies: Secondary | ICD-10-CM | POA: Insufficient documentation

## 2022-03-31 LAB — CBC WITH DIFFERENTIAL/PLATELET
Abs Immature Granulocytes: 0.22 10*3/uL — ABNORMAL HIGH (ref 0.00–0.07)
Basophils Absolute: 0.1 10*3/uL (ref 0.0–0.1)
Basophils Relative: 1 %
Eosinophils Absolute: 0.2 10*3/uL (ref 0.0–0.5)
Eosinophils Relative: 2 %
HCT: 30 % — ABNORMAL LOW (ref 36.0–46.0)
Hemoglobin: 9.8 g/dL — ABNORMAL LOW (ref 12.0–15.0)
Immature Granulocytes: 3 %
Lymphocytes Relative: 25 %
Lymphs Abs: 2.2 10*3/uL (ref 0.7–4.0)
MCH: 32.3 pg (ref 26.0–34.0)
MCHC: 32.7 g/dL (ref 30.0–36.0)
MCV: 99 fL (ref 80.0–100.0)
Monocytes Absolute: 0.6 10*3/uL (ref 0.1–1.0)
Monocytes Relative: 7 %
Neutro Abs: 5.5 10*3/uL (ref 1.7–7.7)
Neutrophils Relative %: 62 %
Platelets: 186 10*3/uL (ref 150–400)
RBC: 3.03 MIL/uL — ABNORMAL LOW (ref 3.87–5.11)
RDW: 14.4 % (ref 11.5–15.5)
WBC: 8.9 10*3/uL (ref 4.0–10.5)
nRBC: 0 % (ref 0.0–0.2)

## 2022-03-31 MED ORDER — MIDAZOLAM HCL 2 MG/2ML IJ SOLN
INTRAMUSCULAR | Status: AC
  Filled 2022-03-31: qty 2

## 2022-03-31 MED ORDER — MIDAZOLAM HCL 5 MG/5ML IJ SOLN
INTRAMUSCULAR | Status: AC | PRN
  Administered 2022-03-31: 1 mg via INTRAVENOUS

## 2022-03-31 MED ORDER — FENTANYL CITRATE (PF) 100 MCG/2ML IJ SOLN
INTRAMUSCULAR | Status: AC | PRN
  Administered 2022-03-31: 50 ug via INTRAVENOUS

## 2022-03-31 MED ORDER — HEPARIN SOD (PORK) LOCK FLUSH 100 UNIT/ML IV SOLN
INTRAVENOUS | Status: AC
  Filled 2022-03-31: qty 5

## 2022-03-31 MED ORDER — FENTANYL CITRATE (PF) 100 MCG/2ML IJ SOLN
INTRAMUSCULAR | Status: AC
  Filled 2022-03-31: qty 2

## 2022-03-31 MED ORDER — SODIUM CHLORIDE 0.9 % IV SOLN: INTRAVENOUS | Status: DC

## 2022-03-31 NOTE — H&P (Signed)
Chief Complaint: Patient was seen in consultation today for image-guided bone marrow biopsy.  Referring Physician(s): Covington,Sarah M  Supervising Physician: Markus Daft  Patient Status: [facility] - Out-pt  History of Present Illness: Sydney Gillespie is a 81 y.o. female with PMH significant for multiple myeloma s/p bone marrow transplant in 2013, Barrett's esophagus, anemia, vertigo, and GERD being seen today for an image-guided bone marrow biopsy. The patient has been followed by Dr Rogue Bussing from Oncology, and recent blood work has revealed an increase in M protein and Kappa light chain values. The patient was subsequently referred to IR for an image-guided bone marrow biopsy.  Past Medical History:  Diagnosis Date   Allergic rhinitis    Allergy    Anemia    Anemia    Barrett's esophagus    Blood dyscrasia    multiple myloma remission   Cancer ([place])    Change in bowel habits    Compression fracture 2013   Degenerative disc disease, lumbar    Dysrhythmia    palpitations   Esophageal reflux    Family history of adverse reaction to anesthesia    nausea -mom   GERD (gastroesophageal reflux disease)    Gout    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[place])    Heart palpitations    Hyperlipidemia    Hypothyroidism    Inflammatory polyarthropathy (HCC)    Lumbago    Lumbar radiculitis    Lumbar stenosis with neurogenic claudication    Multiple myeloma (HCC)    Multiple myeloma ([place])    Osteopenia    Osteoporosis    Other dysphagia    Palpitations    Positive PPD    Renal insufficiency    Rheumatoid arthritis (HCC)    Rheumatoid arthritis (HCC)    Seasonal allergies    Vertigo     Past Surgical History:  Procedure Laterality Date   ABDOMINAL HYSTERECTOMY     APPENDECTOMY     BACK SURGERY     BREAST EXCISIONAL BIOPSY Bilateral    neg   CHOLECYSTECTOMY     COLONOSCOPY WITH PROPOFOL N/A 11/12/2016   Procedure: COLONOSCOPY WITH PROPOFOL;  Surgeon: Manya Silvas, MD;   Location: [facility] ENDOSCOPY;  Service: Endoscopy;  Laterality: N/A;   ESOPHAGOGASTRODUODENOSCOPY (EGD) WITH PROPOFOL N/A 11/12/2016   Procedure: ESOPHAGOGASTRODUODENOSCOPY (EGD) WITH PROPOFOL;  Surgeon: Manya Silvas, MD;  Location: [facility] ENDOSCOPY;  Service: Endoscopy;  Laterality: N/A;   FOOT SURGERY     LUMBAR LAMINECTOMY/DECOMPRESSION MICRODISCECTOMY Left 06/18/2016   Procedure: LAMINECTOMY FOR FACET/SYNOVIAL CYST LUMBAR THREE - LUMBAR FOUR LEFT;  Surgeon: Newman Pies, MD;  Location: [facility];  Service: Neurosurgery;  Laterality: Left;  LAMINECTOMY FOR FACET/SYNOVIAL CYST LUMBAR THREE - LUMBAR FOUR LEFT    Allergies: Isoniazid and Keflex [cephalexin]  Medications: Prior to Admission medications   Medication Sig Start Date End Date Taking? Authorizing Provider  acetaminophen (TYLENOL) 325 MG tablet Take 650 mg by mouth every 6 (six) hours as needed for fever or headache.   Yes [provider]  allopurinol (ZYLOPRIM) 300 MG tablet Take 300 mg by mouth daily.  09/14/14  Yes [provider]  ALPRAZolam (XANAX) 0.25 MG tablet Take 1 tablet (0.25 mg total) by mouth at bedtime as needed for anxiety. 11/05/21  Yes Cammie Sickle, MD  atenolol (TENORMIN) 50 MG tablet Take 0.5 tablets (25 mg total) by mouth daily. 09/05/18  Yes Ghimire, Henreitta Leber, MD  cetirizine (ZYRTEC) 10 MG tablet Take 10 mg by mouth 2 (  two) times daily.    Yes [provider]  DULoxetine (CYMBALTA) 20 MG capsule  10/02/20  Yes [provider]  ferrous sulfate 324 MG TBEC Take 324 mg by mouth.   Yes [provider]  fluticasone (FLONASE) 50 MCG/ACT nasal spray Place 2 sprays into the nose daily.    Yes [provider]  hydroxychloroquine (PLAQUENIL) 200 MG tablet daily. 10/25/18  Yes [provider]  lansoprazole (PREVACID) 30 MG capsule Take 30 mg by mouth daily at 12 noon.   Yes [provider]  leflunomide (ARAVA) 10 MG tablet Take 1 tablet by mouth daily.  01/04/20  Yes [provider]  ondansetron (ZOFRAN) 8 MG tablet TAKE 1 TABLET BY MOUTH EVERY 8 HOURS AS NEEDED FOR NAUSEA OR VOMITING 10/27/21  Yes Cammie Sickle, MD  oxybutynin (DITROPAN-XL) 10 MG 24 hr tablet Take 1 tablet (10 mg total) by mouth daily. 10/21/20  Yes MacDiarmid, Nicki Reaper, MD  rOPINIRole (REQUIP) 0.5 MG tablet Take by mouth. 10/02/20  Yes [provider]  triamterene-hydrochlorothiazide (MAXZIDE-25) 37.5-25 MG tablet TK 1 T PO PRN 09/18/18  Yes [provider]  vitamin B-12 (CYANOCOBALAMIN) 1000 MCG tablet Take 1,000 mcg by mouth daily. 01/03/20  Yes [provider]  denosumab (PROLIA) 60 MG/ML SOSY injection Inject into the skin. Patient not taking: Reported on 05/07/2021    [provider]     Family History  Problem Relation Age of Onset   Hypertension Mother    Heart attack Mother    Rheum arthritis Mother    Osteoarthritis Mother    Cancer Father    Hypertension Father    Stroke Father    Breast cancer Maternal Grandmother 40   Osteoarthritis Other    Rheum arthritis Other     Social History   Socioeconomic History   Marital status: Married    Spouse name: Not on file   Number of children: Not on file   Years of education: Not on file   Highest education level: Not on file  Occupational History   Not on file  Tobacco Use   Smoking status: Never   Smokeless tobacco: Never  Vaping Use   Vaping Use: Never used  Substance and Sexual Activity   Alcohol use: No   Drug use: No   Sexual activity: Yes  Other Topics Concern   Not on file  Social History Narrative   Not on file   Social Determinants of Health   Financial Resource Strain: Not on file  Food Insecurity: Not on file  Transportation Needs: Not on file  Physical Activity: Not on file  Stress: Not on file  Social Connections: Not on file    Review of Systems: A 12 point ROS discussed and pertinent positives are indicated in the HPI above.  All  other systems are negative.  Review of Systems  Constitutional:  Negative for chills and fever.  Respiratory:  Negative for chest tightness and shortness of breath.   Cardiovascular:  Negative for chest pain and leg swelling.  Gastrointestinal:  Negative for abdominal pain, diarrhea, nausea and vomiting.  Neurological:  Negative for dizziness and headaches.  Psychiatric/Behavioral:  Negative for confusion.     Vital Signs: BP (!) 145/57   Pulse 72   Temp 98.5 F (36.9 C)   Resp 16   Ht _0  (1.651 m)   Wt 153 lb (69.4 kg)   SpO2 98%   BMI 25.46 kg/m    Physical Exam  Vitals reviewed.  Constitutional:      Appearance: Normal appearance.  HENT:     Mouth/Throat:     Mouth: Mucous membranes are moist.  Cardiovascular:     Rate and Rhythm: Normal rate and regular rhythm.     Pulses: Normal pulses.     Heart sounds: Normal heart sounds.  Pulmonary:     Effort: Pulmonary effort is normal.     Breath sounds: Normal breath sounds.  Abdominal:     General: Bowel sounds are normal.     Palpations: Abdomen is soft.     Tenderness: There is no abdominal tenderness.  Musculoskeletal:     Right lower leg: No edema.     Left lower leg: No edema.  Neurological:     Mental Status: She is alert and oriented to person, place, and time.  Psychiatric:        Mood and Affect: Mood normal.        Behavior: Behavior normal.     Imaging: No results found.  Labs:  CBC: Recent Labs    05/01/21 1120 10/22/21 1324 02/25/22 1112 03/31/22 0733  WBC 8.5 8.2 10.8* 8.9  HGB 11.0* 10.9* 11.0* 9.8*  HCT 34.1* 34.2* 35.0* 30.0*  PLT 244 211 295 186    COAGS: No results for input(s): "INR", "APTT" in the last 8760 hours.  BMP: Recent Labs    05/01/21 1120 10/22/21 1324 02/25/22 1112  NA 134* 136 134*  K 3.9 4.0 4.1  CL 104 100 105  CO2 21* 27 18*  GLUCOSE 100* 98 111*  BUN 33* 24* 38*  CALCIUM 9.8 9.5 10.2  CREATININE 2.21* 2.03* 2.53*  GFRNONAA 22* 24* 19*     LIVER FUNCTION TESTS: Recent Labs    05/01/21 1120 10/22/21 1324 02/25/22 1112  BILITOT 0.3 0.4 0.4  AST _0 ALT _1 ALKPHOS 78 66 69  PROT 7.4 7.2 7.6  ALBUMIN 3.9 3.8 4.1    TUMOR MARKERS: No results for input(s): "AFPTM", "CEA", "CA199", "CHROMGRNA" in the last 8760 hours.  Assessment and Plan:  Sydney Gillespie is an 81 yo female with PMH significant for multiple myeloma s/p bone marrow transplant in 2013, Barrett's esophagus, anemia, vertigo, and GERD. The patient is being followed by oncology for multiple myeloma. The patient is to undergo CT-guided bone marrow biopsy today with Dr Anselm Pancoast.   Risks and benefits of CT-guided bone marrow biopsy was discussed with the patient and/or patient's family including, but not limited to bleeding, infection, damage to adjacent structures or low yield requiring additional tests.  All of the questions were answered and there is agreement to proceed.  Consent signed and in chart.   Thank you for this interesting consult.  I greatly enjoyed meeting TUNISHA RULAND and look forward to participating in their care.  A copy of this report was sent to the requesting provider on this date.  Electronically Signed: Lura Em, PA-C 03/31/2022, 8:39 AM   I spent a total of  30 Minutes   in face to face in clinical consultation, greater than 50% of which was counseling/coordinating care for image-guided bone marrow biopsy.

## 2022-03-31 NOTE — Progress Notes (Signed)
Patient clinically stable post BMB per DR Anselm Pancoast, tolerated well. Vitals stable pre and post procedure. Received Versed 1 mg along with Fentanyl 50 mcg IV for procedure. Report given to Carlynn Spry RN post procedure/ 331.

## 2022-03-31 NOTE — Procedures (Signed)
Interventional Radiology Procedure:   Indications: Multiple myeloma  Procedure: CT guided bone marrow biopsy  Findings: 2 aspirates and 1 core from left ilium  Complications: None     EBL: Minimal, less than 10 ml  Plan: Discharge to home in one hour.   Nastassja Witkop R. Anselm Pancoast, MD  Pager: [phone]

## 2022-04-03 LAB — SURGICAL PATHOLOGY

## 2022-04-07 ENCOUNTER — Ambulatory Visit
Admission: RE | Admit: 2022-04-07 | Discharge: 2022-04-07 | Disposition: A | Discharge status: Home / Self Care | Payer: Medicare Other | Source: Ambulatory Visit | Attending: Medical Oncology | Admitting: Medical Oncology

## 2022-04-07 ENCOUNTER — Encounter (HOSPITAL_COMMUNITY): Payer: Self-pay | Admitting: Internal Medicine

## 2022-04-07 ENCOUNTER — Ambulatory Visit
Admission: RE | Admit: 2022-04-07 | Discharge: 2022-04-07 | Disposition: A | Discharge status: Home / Self Care | Payer: Medicare Other | Attending: Medical Oncology | Admitting: Medical Oncology

## 2022-04-07 DIAGNOSIS — C9000 Multiple myeloma not having achieved remission: Secondary | ICD-10-CM

## 2022-04-09 ENCOUNTER — Encounter (HOSPITAL_COMMUNITY): Payer: Self-pay | Admitting: Internal Medicine

## 2022-04-10 ENCOUNTER — Encounter: Payer: Self-pay | Admitting: Internal Medicine

## 2022-04-10 ENCOUNTER — Inpatient Hospital Stay: Payer: Medicare Other | Attending: Internal Medicine | Admitting: Internal Medicine

## 2022-04-10 VITALS — BP 142/65 | HR 68 | Temp 96.7°F | Resp 20 | Wt 156.6 lb

## 2022-04-10 DIAGNOSIS — Z23 Encounter for immunization: Secondary | ICD-10-CM | POA: Diagnosis not present

## 2022-04-10 DIAGNOSIS — Z881 Allergy status to other antibiotic agents status: Secondary | ICD-10-CM | POA: Insufficient documentation

## 2022-04-10 DIAGNOSIS — U071 COVID-19: Secondary | ICD-10-CM | POA: Diagnosis not present

## 2022-04-10 DIAGNOSIS — Z79899 Other long term (current) drug therapy: Secondary | ICD-10-CM | POA: Insufficient documentation

## 2022-04-10 DIAGNOSIS — Z803 Family history of malignant neoplasm of breast: Secondary | ICD-10-CM | POA: Insufficient documentation

## 2022-04-10 DIAGNOSIS — Z809 Family history of malignant neoplasm, unspecified: Secondary | ICD-10-CM | POA: Insufficient documentation

## 2022-04-10 DIAGNOSIS — G8929 Other chronic pain: Secondary | ICD-10-CM | POA: Diagnosis not present

## 2022-04-10 DIAGNOSIS — R509 Fever, unspecified: Secondary | ICD-10-CM | POA: Diagnosis not present

## 2022-04-10 DIAGNOSIS — Z7961 Long term (current) use of immunomodulator: Secondary | ICD-10-CM | POA: Insufficient documentation

## 2022-04-10 DIAGNOSIS — F419 Anxiety disorder, unspecified: Secondary | ICD-10-CM | POA: Diagnosis not present

## 2022-04-10 DIAGNOSIS — Z8249 Family history of ischemic heart disease and other diseases of the circulatory system: Secondary | ICD-10-CM | POA: Insufficient documentation

## 2022-04-10 DIAGNOSIS — R067 Sneezing: Secondary | ICD-10-CM | POA: Insufficient documentation

## 2022-04-10 DIAGNOSIS — Z7969 Long term (current) use of other immunomodulators and immunosuppressants: Secondary | ICD-10-CM | POA: Diagnosis not present

## 2022-04-10 DIAGNOSIS — Z5111 Encounter for antineoplastic chemotherapy: Secondary | ICD-10-CM | POA: Diagnosis present

## 2022-04-10 DIAGNOSIS — C9000 Multiple myeloma not having achieved remission: Secondary | ICD-10-CM

## 2022-04-10 DIAGNOSIS — M549 Dorsalgia, unspecified: Secondary | ICD-10-CM | POA: Diagnosis not present

## 2022-04-10 DIAGNOSIS — M069 Rheumatoid arthritis, unspecified: Secondary | ICD-10-CM | POA: Insufficient documentation

## 2022-04-10 DIAGNOSIS — R058 Other specified cough: Secondary | ICD-10-CM | POA: Diagnosis not present

## 2022-04-10 DIAGNOSIS — R110 Nausea: Secondary | ICD-10-CM | POA: Insufficient documentation

## 2022-04-10 DIAGNOSIS — Z9049 Acquired absence of other specified parts of digestive tract: Secondary | ICD-10-CM | POA: Insufficient documentation

## 2022-04-10 DIAGNOSIS — C9002 Multiple myeloma in relapse: Secondary | ICD-10-CM | POA: Insufficient documentation

## 2022-04-10 DIAGNOSIS — K5900 Constipation, unspecified: Secondary | ICD-10-CM | POA: Diagnosis not present

## 2022-04-10 DIAGNOSIS — D649 Anemia, unspecified: Secondary | ICD-10-CM | POA: Insufficient documentation

## 2022-04-10 DIAGNOSIS — Z9484 Stem cells transplant status: Secondary | ICD-10-CM | POA: Insufficient documentation

## 2022-04-10 DIAGNOSIS — R5383 Other fatigue: Secondary | ICD-10-CM | POA: Diagnosis not present

## 2022-04-10 DIAGNOSIS — Z9481 Bone marrow transplant status: Secondary | ICD-10-CM | POA: Insufficient documentation

## 2022-04-10 DIAGNOSIS — J189 Pneumonia, unspecified organism: Secondary | ICD-10-CM

## 2022-04-10 DIAGNOSIS — N189 Chronic kidney disease, unspecified: Secondary | ICD-10-CM | POA: Insufficient documentation

## 2022-04-10 DIAGNOSIS — Z823 Family history of stroke: Secondary | ICD-10-CM | POA: Insufficient documentation

## 2022-04-10 DIAGNOSIS — R630 Anorexia: Secondary | ICD-10-CM | POA: Diagnosis not present

## 2022-04-10 DIAGNOSIS — M2550 Pain in unspecified joint: Secondary | ICD-10-CM | POA: Diagnosis not present

## 2022-04-10 DIAGNOSIS — Z8261 Family history of arthritis: Secondary | ICD-10-CM | POA: Insufficient documentation

## 2022-04-10 HISTORY — DX: Pneumonia, unspecified organism: J18.9

## 2022-04-10 MED ORDER — ACYCLOVIR 400 MG PO TABS: 400.0000 mg | ORAL_TABLET | Freq: Two times a day (BID) | ORAL | 4 refills | Status: DC

## 2022-04-10 NOTE — Progress Notes (Signed)
Patient states she is anxious about her test results.

## 2022-04-10 NOTE — Progress Notes (Signed)
[facility] OFFICE PROGRESS NOTE  Patient Care Team: [name], MD as PCP - General (Internal Medicine) Cammie Sickle, MD as Consulting Physician (Hematology and Oncology)    SUMMARY OF ONCOLOGIC HISTORY:  Oncology History Overview Note  # 2012- MULTIPLE MYELOMA IgG Kappa Light chain FISH del13; s/p AUTO- BMT [MAY 2013; Dr.Gabriel; [facility]] AUG 2017- M-PROTEIN NEG; K/L= 1.49/N; NO MAINTENANCE THERAPY  # OCT 2021- RELAPSED;  BMBx- plasma cells arranged in small clusters and aggregates [10% of total marrow cellularity]  Overall, the features are worrisome for early relapse of the patient's previously diagnosed plasma cell neoplasm.  No evidence of organ dysfunction; continue close surveillance.  # NOV 2023- NOV 2023- REPEAT BONE MARROW-plasma cells cell 10-20%.  Kappa lambda light chain ratio-rising; Lamda light chain- 329. NOV 2023-skeletal survey no lytic lesions.  Worsening renal insufficiency; GFR 20s; and also worsening anemia hemoglobin 9.8; iron saturation 20%.   # DEC 7th, 2023- DARA SQ+ Dex 20 mg; REV- hold cycle #1.   # CKD [~ creat 1.35- 1.7] ; Dr.Singh  # 2012- LEFT KIDNEY ? 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 benign   Multiple myeloma not having achieved remission ([place])  12/18/2015 Initial Diagnosis   Multiple myeloma in remission ([place])   04/16/2022 -  Chemotherapy   Patient is on Treatment Plan : MYELOMA RELAPSED REFRACTORY Daratumumab SQ + Lenalidomide + Dexamethasone (DaraRd) q28d        INTERVAL HISTORY: Ambulating independently.  Accompanied by her granddaughter.  This is a pleasant 81 year-old female patient with above history of multiple Myeloma [status post autologous stem cell transplant May 2013 patient denies any] currently on surveillance/no maintenance therapy is here for follow-up-in review results of the bone marrow biopsy.  Patient complains of ongoing fatigue.  Otherwise denies any diarrhea or  constipation.  Denies any worsening tingling or numbness.  Denies any Vomiting denies any fevers or chills.   Denies any worsening back pain.  Otherwise no chest pain or shortness of breath or cough.  Review of Systems  Constitutional:  Positive for malaise/fatigue. Negative for diaphoresis, fever and weight loss.       Cold intolerance.  HENT:  Negative for nosebleeds and sore throat.   Eyes:  Negative for double vision.  Respiratory:  Negative for cough, hemoptysis, sputum production, shortness of breath and wheezing.   Cardiovascular:  Negative for chest pain, palpitations, orthopnea and leg swelling.  Gastrointestinal:  Negative for abdominal pain, blood in stool, constipation, diarrhea, heartburn, melena, nausea and vomiting.  Genitourinary:  Negative for dysuria, frequency and urgency.  Musculoskeletal:  Positive for back pain, joint pain and myalgias.  Skin: Negative.  Negative for itching and rash.  Neurological:  Positive for tingling. Negative for dizziness, focal weakness, weakness and headaches.  Endo/Heme/Allergies:  Does not bruise/bleed easily.  Psychiatric/Behavioral:  Negative for depression. The patient is not nervous/anxious and does not have insomnia.     ALLERGIES:  is allergic to isoniazid and keflex [cephalexin].  MEDICATIONS:  Current Outpatient Medications  Medication Sig Dispense Refill   acetaminophen (TYLENOL) 325 MG tablet Take 650 mg by mouth every 6 (six) hours as needed for fever or headache.     acyclovir (ZOVIRAX) 400 MG tablet Take 1 tablet (400 mg total) by mouth 2 (two) times daily. 60 tablet 4   allopurinol (ZYLOPRIM) 300 MG tablet Take 300 mg by mouth daily.      ALPRAZolam (XANAX) 0.25 MG tablet Take 1 tablet (  0.25 mg total) by mouth at bedtime as needed for anxiety. 30 tablet 0   atenolol (TENORMIN) 50 MG tablet Take 0.5 tablets (25 mg total) by mouth daily. 30 tablet 0   cetirizine (ZYRTEC) 10 MG tablet Take 10 mg by mouth 2 (two) times daily.       DULoxetine (CYMBALTA) 20 MG capsule      ferrous sulfate 324 MG TBEC Take 324 mg by mouth.     fluticasone (FLONASE) 50 MCG/ACT nasal spray Place 2 sprays into the nose daily.      hydroxychloroquine (PLAQUENIL) 200 MG tablet daily.     lansoprazole (PREVACID) 30 MG capsule Take 30 mg by mouth daily at 12 noon.     leflunomide (ARAVA) 10 MG tablet Take 1 tablet by mouth daily.     levothyroxine (SYNTHROID) 112 MCG tablet      MYRBETRIQ 50 MG TB24 tablet Take 50 mg by mouth daily.     ondansetron (ZOFRAN) 8 MG tablet TAKE 1 TABLET BY MOUTH EVERY 8 HOURS AS NEEDED FOR NAUSEA OR VOMITING 40 tablet 1   oxybutynin (DITROPAN-XL) 10 MG 24 hr tablet Take 1 tablet (10 mg total) by mouth daily. 30 tablet 11   rOPINIRole (REQUIP) 0.5 MG tablet Take by mouth.     triamterene-hydrochlorothiazide (MAXZIDE-25) 37.5-25 MG tablet TK 1 T PO PRN     vitamin B-12 (CYANOCOBALAMIN) 1000 MCG tablet Take 1,000 mcg by mouth daily.     denosumab (PROLIA) 60 MG/ML SOSY injection Inject into the skin. (Patient not taking: Reported on 05/07/2021)     No current facility-administered medications for this visit.    PHYSICAL EXAMINATION: ECOG PERFORMANCE STATUS: 0 - Asymptomatic  BP (!) 142/65   Pulse 68   Temp (!) 96.7 F (35.9 C)   Resp 20   Wt 156 lb 9.6 oz (71 kg)   SpO2 100%   BMI 26.06 kg/m   Filed Weights   04/10/22 0935  Weight: 156 lb 9.6 oz (71 kg)    Physical Exam HENT:     Head: Normocephalic and atraumatic.     Mouth/Throat:     Pharynx: No oropharyngeal exudate.  Eyes:     Pupils: Pupils are equal, round, and reactive to light.  Cardiovascular:     Rate and Rhythm: Normal rate and regular rhythm.  Pulmonary:     Effort: No respiratory distress.     Breath sounds: No wheezing.  Abdominal:     General: Bowel sounds are normal. There is no distension.     Palpations: Abdomen is soft. There is no mass.     Tenderness: There is no abdominal tenderness. There is no guarding or  rebound.  Musculoskeletal:        General: No tenderness. Normal range of motion.     Cervical back: Normal range of motion and neck supple.  Skin:    General: Skin is warm.  Neurological:     Mental Status: She is alert and oriented to person, place, and time.  Psychiatric:        Mood and Affect: Affect normal.     LABORATORY DATA:  I have reviewed the data as listed    Component Value Date/Time   NA 134 (L) 02/25/2022 1112   NA 142 08/16/2013 1028   K 4.1 02/25/2022 1112   K 4.5 08/16/2013 1028   CL 105 02/25/2022 1112   CL 106 08/16/2013 1028   CO2 18 (L) 02/25/2022 1112   CO2 27  08/16/2013 1028   GLUCOSE 111 (H) 02/25/2022 1112   GLUCOSE 99 08/16/2013 1028   BUN 38 (H) 02/25/2022 1112   BUN 21 (H) 08/16/2013 1028   CREATININE 2.53 (H) 02/25/2022 1112   CREATININE 1.42 (H) 09/07/2014 0954   CALCIUM 10.2 02/25/2022 1112   CALCIUM 9.3 09/07/2014 0954   PROT 7.6 02/25/2022 1112   PROT 7.4 08/16/2013 1028   ALBUMIN 4.1 02/25/2022 1112   ALBUMIN 3.9 08/16/2013 1028   AST 29 02/25/2022 1112   AST 27 08/16/2013 1028   ALT 9 02/25/2022 1112   ALT 15 08/16/2013 1028   ALKPHOS 69 02/25/2022 1112   ALKPHOS 61 08/16/2013 1028   BILITOT 0.4 02/25/2022 1112   BILITOT 0.3 08/16/2013 1028   GFRNONAA 19 (L) 02/25/2022 1112   GFRNONAA 36 (L) 09/07/2014 0954   GFRAA 19 (L) 02/09/2020 1001   GFRAA 42 (L) 09/07/2014 0954    No results found for: "SPEP", "UPEP"  Lab Results  Component Value Date   WBC 8.9 03/31/2022   NEUTROABS 5.5 03/31/2022   HGB 9.8 (L) 03/31/2022   HCT 30.0 (L) 03/31/2022   MCV 99.0 03/31/2022   PLT 186 03/31/2022      Chemistry      Component Value Date/Time   NA 134 (L) 02/25/2022 1112   NA 142 08/16/2013 1028   K 4.1 02/25/2022 1112   K 4.5 08/16/2013 1028   CL 105 02/25/2022 1112   CL 106 08/16/2013 1028   CO2 18 (L) 02/25/2022 1112   CO2 27 08/16/2013 1028   BUN 38 (H) 02/25/2022 1112   BUN 21 (H) 08/16/2013 1028   CREATININE 2.53  (H) 02/25/2022 1112   CREATININE 1.42 (H) 09/07/2014 0954      Component Value Date/Time   CALCIUM 10.2 02/25/2022 1112   CALCIUM 9.3 09/07/2014 0954   ALKPHOS 69 02/25/2022 1112   ALKPHOS 61 08/16/2013 1028   AST 29 02/25/2022 1112   AST 27 08/16/2013 1028   ALT 9 02/25/2022 1112   ALT 15 08/16/2013 1028   BILITOT 0.4 02/25/2022 1112   BILITOT 0.3 08/16/2013 1028       RADIOGRAPHIC STUDIES: I have personally reviewed the radiological images as listed and agreed with the findings in the report. No results found.   ASSESSMENT & PLAN:    Multiple myeloma not having achieved remission ([place]) # Multiple myeloma-Dx: 2012 s/p autologous stem cell transplant in May 2013; OCT 2021- BMBx- plasma cells arranged in small clusters and aggregates [10% of total marrow cellularity]; NOV 2023- REPEAT BONE MARROW-plasma cells cell 10-20%.  Kappa lambda light chain ratio-rising; Lamda light chain- 329. NOV 2023-skeletal survey no lytic lesions.  Worsening renal insufficiency; GFR 20s; and also worsening anemia hemoglobin 9.8; iron saturation 20%.   # Discussed the mechanism of action of each drug-Revlimid immunomodulatory [HOLD for 1st cycle- given the renal insuff]; Daratumumab targeted therapy; dexamethasone-cytotoxic.  Discussed the potential side effects- Dara SQ-infusion reaction; risk of infections etc.;  Low blood counts etc. Revlimid-teratogenic side effects; skin rash diarrhea; thromboembolic events [aspirin prophylaxis]; dexamethasone-elevated blood sugars/weight gain fluid retention etc. patient previously received Revlimid in the past.  Understands treatments are palliative; not curative.  Also discussed that Dara SQ weekly for the first 2 months; and then every 2 weeks for the next 4 months; then every month.  Patient will need to be monitored postinfusion for the first 2 cycles.  Patient will also need premedication to avoid acute infusion reactions.   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  # Anxiety/nausea- prescribed/refilled  xanax 0.25 mg qhs [further refills to PCP]  #DISPOSITION:  # follow up in 1 week/Thursday- MD; labs- cbc/cmp; RBC phenotype; Dara SQ [new]  # follow up in 2 week/Thursday- MD; labs- cbc/cmp; Dara SQ-Dr.B  Cc; Dr.Hande/ Dr.Singh      Cammie Sickle, MD 04/10/2022 1:22 PM

## 2022-04-10 NOTE — Progress Notes (Signed)
START ON PATHWAY REGIMEN - Multiple Myeloma and Other Plasma Cell Dyscrasias     Cycles 1 and 2: A cycle is every 28 days:     Lenalidomide      Dexamethasone      Daratumumab and hyaluronidase-fihj    Cycles 3 through 6: A cycle is every 28 days:     Lenalidomide      Dexamethasone      Dexamethasone      Daratumumab and hyaluronidase-fihj    Cycles 7 and beyond: A cycle is every 28 days:     Lenalidomide      Dexamethasone      Dexamethasone      Daratumumab and hyaluronidase-fihj   **Always confirm dose/schedule in your pharmacy ordering system**  Patient Characteristics: Multiple Myeloma, Relapsed / Refractory, Second through Fourth Lines of Therapy, Fit or Candidate for Triplet Therapy, Not Lenalidomide-Refractory and Lenalidomide-based Regimen Preferred, Candidate for Anti-CD38 Antibody Disease Classification: Multiple Myeloma R-ISS Staging: III Therapeutic Status: Relapsed Line of Therapy: Second Line Anti-CD38 Antibody Candidacy: Candidate for Anti-CD38 Antibody Lenalidomide-based Regimen Preference/Candidacy: Not Lenalidomide-Refractory and Lenalidomide-based Regimen Preferred Intent of Therapy: Non-Curative / Palliative Intent, Discussed with Patient 

## 2022-04-10 NOTE — Assessment & Plan Note (Addendum)
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  Kappa lambda light chain ratio-rising; Lamda light chain- 329. NOV 2023-skeletal survey no lytic lesions.  Worsening renal insufficiency; GFR 20s; and also worsening anemia hemoglobin 9.8; iron saturation 20%.   # Discussed the mechanism of action of each drug-Revlimid immunomodulatory [HOLD for 1st cycle- given the renal insuff]; Daratumumab targeted therapy; dexamethasone-cytotoxic.  Discussed the potential side effects- Dara SQ-infusion reaction; risk of infections etc.;  Low blood counts etc. Revlimid-teratogenic side effects; skin rash diarrhea; thromboembolic events [aspirin prophylaxis]; dexamethasone-elevated blood sugars/weight gain fluid retention etc. patient previously received Revlimid in the past.  Understands treatments are palliative; not curative.  Also discussed that Dara SQ weekly for the first 2 months; and then every 2 weeks for the next 4 months; then every month.  Patient will need to be monitored postinfusion for the first 2 cycles.  Patient will also need premedication to avoid acute infusion reactions.   # CKD stage IVcreatinine-creatinine 2.43/GFR-24-[Dr.Singh] Continue close monitoring -STABLE  # RA- [on Plaquenil]- STABLE.  # Anxiety/nausea- prescribed/refilled  xanax 0.25 mg qhs [further refills to PCP]  #DISPOSITION:  # follow up in 1 week/Thursday- MD; labs- cbc/cmp; RBC phenotype; Dara SQ [new]  # follow up in 2 week/Thursday- MD; labs- cbc/cmp; Dara SQ-Dr.B  Cc; Dr.Hande/ Dr.Singh

## 2022-04-10 NOTE — Progress Notes (Signed)
Patient on plan of care prior to pathways. 

## 2022-04-11 ENCOUNTER — Other Ambulatory Visit: Payer: Self-pay

## 2022-04-13 ENCOUNTER — Other Ambulatory Visit: Payer: Self-pay

## 2022-04-16 ENCOUNTER — Inpatient Hospital Stay: Payer: Medicare Other

## 2022-04-16 ENCOUNTER — Encounter: Payer: Self-pay | Admitting: Internal Medicine

## 2022-04-16 ENCOUNTER — Inpatient Hospital Stay: Payer: Medicare Other | Admitting: Pharmacist

## 2022-04-16 ENCOUNTER — Inpatient Hospital Stay (HOSPITAL_BASED_OUTPATIENT_CLINIC_OR_DEPARTMENT_OTHER): Payer: Medicare Other | Admitting: Internal Medicine

## 2022-04-16 ENCOUNTER — Telehealth: Payer: Self-pay

## 2022-04-16 ENCOUNTER — Other Ambulatory Visit (HOSPITAL_COMMUNITY): Payer: Self-pay

## 2022-04-16 VITALS — BP 132/75 | HR 70 | Temp 98.8°F | Resp 18 | Ht 65.0 in | Wt 155.8 lb

## 2022-04-16 DIAGNOSIS — Z5111 Encounter for antineoplastic chemotherapy: Secondary | ICD-10-CM | POA: Diagnosis not present

## 2022-04-16 DIAGNOSIS — C9000 Multiple myeloma not having achieved remission: Secondary | ICD-10-CM

## 2022-04-16 DIAGNOSIS — Z23 Encounter for immunization: Secondary | ICD-10-CM

## 2022-04-16 LAB — TYPE AND SCREEN
ABO/RH(D): O POS
Antibody Screen: NEGATIVE

## 2022-04-16 LAB — COMPREHENSIVE METABOLIC PANEL
ALT: 9 U/L (ref 0–44)
AST: 27 U/L (ref 15–41)
Albumin: 4 g/dL (ref 3.5–5.0)
Alkaline Phosphatase: 56 U/L (ref 38–126)
Anion gap: 9 (ref 5–15)
BUN: 44 mg/dL — ABNORMAL HIGH (ref 8–23)
CO2: 20 mmol/L — ABNORMAL LOW (ref 22–32)
Calcium: 10 mg/dL (ref 8.9–10.3)
Chloride: 107 mmol/L (ref 98–111)
Creatinine, Ser: 2.24 mg/dL — ABNORMAL HIGH (ref 0.44–1.00)
GFR, Estimated: 22 mL/min — ABNORMAL LOW (ref >60)
Glucose, Bld: 121 mg/dL — ABNORMAL HIGH (ref 70–99)
Potassium: 4.7 mmol/L (ref 3.5–5.1)
Sodium: 136 mmol/L (ref 135–145)
Total Bilirubin: 0.5 mg/dL (ref 0.3–1.2)
Total Protein: 7 g/dL (ref 6.5–8.1)

## 2022-04-16 LAB — CBC WITH DIFFERENTIAL/PLATELET
Abs Immature Granulocytes: 0.11 10*3/uL — ABNORMAL HIGH (ref 0.00–0.07)
Basophils Absolute: 0.1 10*3/uL (ref 0.0–0.1)
Basophils Relative: 1 %
Eosinophils Absolute: 0.2 10*3/uL (ref 0.0–0.5)
Eosinophils Relative: 2 %
HCT: 31.4 % — ABNORMAL LOW (ref 36.0–46.0)
Hemoglobin: 10.2 g/dL — ABNORMAL LOW (ref 12.0–15.0)
Immature Granulocytes: 2 %
Lymphocytes Relative: 26 %
Lymphs Abs: 1.9 10*3/uL (ref 0.7–4.0)
MCH: 33 pg (ref 26.0–34.0)
MCHC: 32.5 g/dL (ref 30.0–36.0)
MCV: 101.6 fL — ABNORMAL HIGH (ref 80.0–100.0)
Monocytes Absolute: 0.5 10*3/uL (ref 0.1–1.0)
Monocytes Relative: 7 %
Neutro Abs: 4.7 10*3/uL (ref 1.7–7.7)
Neutrophils Relative %: 62 %
Platelets: 192 10*3/uL (ref 150–400)
RBC: 3.09 MIL/uL — ABNORMAL LOW (ref 3.87–5.11)
RDW: 14.6 % (ref 11.5–15.5)
WBC: 7.4 10*3/uL (ref 4.0–10.5)
nRBC: 0 % (ref 0.0–0.2)

## 2022-04-16 LAB — PRETREATMENT RBC PHENOTYPE

## 2022-04-16 MED ORDER — LENALIDOMIDE 10 MG PO CAPS: 10.0000 mg | ORAL_CAPSULE | Freq: Every day | ORAL | 0 refills | Status: DC

## 2022-04-16 MED ORDER — DIPHENHYDRAMINE HCL 25 MG PO CAPS
50.0000 mg | ORAL_CAPSULE | Freq: Once | ORAL | Status: AC
  Administered 2022-04-16: 50 mg via ORAL
  Filled 2022-04-16: qty 2

## 2022-04-16 MED ORDER — MONTELUKAST SODIUM 10 MG PO TABS
10.0000 mg | ORAL_TABLET | Freq: Once | ORAL | Status: AC
  Administered 2022-04-16: 10 mg via ORAL
  Filled 2022-04-16: qty 1

## 2022-04-16 MED ORDER — INFLUENZA VAC A&B SA ADJ QUAD 0.5 ML IM PRSY
0.5000 mL | PREFILLED_SYRINGE | Freq: Once | INTRAMUSCULAR | Status: AC
  Administered 2022-04-16: 0.5 mL via INTRAMUSCULAR
  Filled 2022-04-16: qty 0.5

## 2022-04-16 MED ORDER — DARATUMUMAB-HYALURONIDASE-FIHJ 1800-30000 MG-UT/15ML ~~LOC~~ SOLN
1800.0000 mg | Freq: Once | SUBCUTANEOUS | Status: AC
  Administered 2022-04-16: 1800 mg via SUBCUTANEOUS
  Filled 2022-04-16: qty 15

## 2022-04-16 MED ORDER — ACETAMINOPHEN 325 MG PO TABS
650.0000 mg | ORAL_TABLET | Freq: Once | ORAL | Status: AC
  Administered 2022-04-16: 650 mg via ORAL
  Filled 2022-04-16: qty 2

## 2022-04-16 MED ORDER — DEXAMETHASONE 4 MG PO TABS
20.0000 mg | ORAL_TABLET | Freq: Once | ORAL | Status: AC
  Administered 2022-04-16: 20 mg via ORAL
  Filled 2022-04-16: qty 5

## 2022-04-16 NOTE — Progress Notes (Signed)
[place]  Telephone:([phone] [phone]  Patient Care Team: [name], MD as PCP - General (Internal Medicine) Cammie Sickle, MD as Consulting Physician (Hematology and Oncology)   Name of the patient: Sydney Gillespie  [id number]  10/31/1940   Date of visit: 04/16/22  HPI: Patient is a 81 y.o. female with relapsed multiple myeloma. Patient will start receiving daratumumab and dexamethasone today 04/16/22. MD plans to add on lenalidomide with cycle 2 of treatment   Reason for Consult: Lenalidomide oral chemotherapy education.   PAST MEDICAL HISTORY: Past Medical History:  Diagnosis Date   Allergic rhinitis    Allergy    Anemia    Anemia    Barrett's esophagus    Blood dyscrasia    multiple myloma remission   Cancer ([place])    Change in bowel habits    Compression fracture 2013   Degenerative disc disease, lumbar    Dysrhythmia    palpitations   Esophageal reflux    Family history of adverse reaction to anesthesia    nausea -mom   GERD (gastroesophageal reflux disease)    Gout    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[place])    Heart palpitations    Hyperlipidemia    Hypothyroidism    Inflammatory polyarthropathy (HCC)    Lumbago    Lumbar radiculitis    Lumbar stenosis with neurogenic claudication    Multiple myeloma ([place])    Multiple myeloma ([place])    Osteopenia    Osteoporosis    Other dysphagia    Palpitations    Positive PPD    Renal insufficiency    Rheumatoid arthritis ([place])    Rheumatoid arthritis (HCC)    Seasonal allergies    Vertigo     HEMATOLOGY/ONCOLOGY HISTORY:  Oncology History Overview Note  # 2012- MULTIPLE MYELOMA IgG Kappa Light chain FISH del13; s/p AUTO- BMT [MAY 2013; Dr.Gabriel; [facility]] AUG 2017- M-PROTEIN NEG; K/L= 1.49/N; NO MAINTENANCE THERAPY  # OCT 2021- RELAPSED;  BMBx- plasma cells arranged in small clusters and aggregates [10% of total marrow cellularity]  Overall, the  features are worrisome for early relapse of the patient's previously diagnosed plasma cell neoplasm.  No evidence of organ dysfunction; continue close surveillance.  # NOV 2023- NOV 2023- REPEAT BONE MARROW-plasma cells cell 10-20%.  Kappa lambda light chain ratio-rising; Lamda light chain- 329. NOV 2023-skeletal survey no lytic lesions.  Worsening renal insufficiency; GFR 20s; and also worsening anemia hemoglobin 9.8; iron saturation 20%.   # DEC 7th, 2023- DARA SQ+ Dex 20 mg; REV- hold cycle #1.   # CKD [~ creat 1.35- 1.7] ; Dr.Singh  # 2012- LEFT KIDNEY ? 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 benign   Multiple myeloma not having achieved remission ([place])  12/18/2015 Initial Diagnosis   Multiple myeloma in remission ([place])   04/16/2022 -  Chemotherapy   Patient is on Treatment Plan : MYELOMA RELAPSED REFRACTORY Daratumumab SQ + Lenalidomide + Dexamethasone (DaraRd) q28d       ALLERGIES:  is allergic to isoniazid and keflex [cephalexin].  MEDICATIONS:  Current Outpatient Medications  Medication Sig Dispense Refill   lenalidomide (REVLIMID) 10 MG capsule Take 1 capsule (10 mg total) by mouth daily. Take for 21 days, then hold for 7 days. Repeat every 28 days. 21 capsule 0   acetaminophen (TYLENOL) 325 MG tablet Take 650 mg by mouth every 6 (six) hours as needed for fever or headache.  acyclovir (ZOVIRAX) 400 MG tablet Take 1 tablet (400 mg total) by mouth 2 (two) times daily. (Patient not taking: Reported on 04/16/2022) 60 tablet 4   allopurinol (ZYLOPRIM) 300 MG tablet Take 300 mg by mouth daily.      ALPRAZolam (XANAX) 0.25 MG tablet Take 1 tablet (0.25 mg total) by mouth at bedtime as needed for anxiety. 30 tablet 0   Ascorbic Acid (VITAMIN C) 1000 MG tablet Take 1,000 mg by mouth daily.     atenolol (TENORMIN) 50 MG tablet Take 0.5 tablets (25 mg total) by mouth daily. 30 tablet 0   cetirizine (ZYRTEC) 10 MG tablet Take 10 mg by mouth 2 (two) times daily.       denosumab (PROLIA) 60 MG/ML SOSY injection Inject into the skin. (Patient not taking: Reported on 05/07/2021)     docusate sodium (COLACE) 100 MG capsule Take 100 mg by mouth 2 (two) times daily.     DULoxetine (CYMBALTA) 20 MG capsule      ferrous sulfate 324 MG TBEC Take 324 mg by mouth.     fluticasone (FLONASE) 50 MCG/ACT nasal spray Place 2 sprays into the nose daily.      hydroxychloroquine (PLAQUENIL) 200 MG tablet daily.     lansoprazole (PREVACID) 30 MG capsule Take 30 mg by mouth daily at 12 noon.     leflunomide (ARAVA) 10 MG tablet Take 1 tablet by mouth daily.     levothyroxine (SYNTHROID) 112 MCG tablet      MYRBETRIQ 50 MG TB24 tablet Take 50 mg by mouth daily.     ondansetron (ZOFRAN) 8 MG tablet TAKE 1 TABLET BY MOUTH EVERY 8 HOURS AS NEEDED FOR NAUSEA OR VOMITING 40 tablet 1   oxybutynin (DITROPAN-XL) 10 MG 24 hr tablet Take 1 tablet (10 mg total) by mouth daily. 30 tablet 11   rOPINIRole (REQUIP) 0.5 MG tablet Take by mouth.     triamterene-hydrochlorothiazide (MAXZIDE-25) 37.5-25 MG tablet TK 1 T PO PRN (Patient not taking: Reported on 04/16/2022)     vitamin B-12 (CYANOCOBALAMIN) 1000 MCG tablet Take 1,000 mcg by mouth daily.     No current facility-administered medications for this visit.    VITAL SIGNS: There were no vitals taken for this visit. There were no vitals filed for this visit.  Estimated body mass index is 25.93 kg/m as calculated from the following:   Height as of an earlier encounter on 04/16/22: _0  (1.651 m).   Weight as of an earlier encounter on 04/16/22: 70.7 kg (155 lb 12.8 oz).  LABS: CBC:    Component Value Date/Time   WBC 7.4 04/16/2022 0904   HGB 10.2 (L) 04/16/2022 0904   HGB 12.5 09/07/2014 0954   HCT 31.4 (L) 04/16/2022 0904   HCT 38.1 09/07/2014 0954   PLT 192 04/16/2022 0904   PLT 159 09/07/2014 0954   MCV 101.6 (H) 04/16/2022 0904   MCV 94 09/07/2014 0954   NEUTROABS 4.7 04/16/2022 0904   NEUTROABS 7.2 (H) 09/07/2014  0954   LYMPHSABS 1.9 04/16/2022 0904   LYMPHSABS 1.0 09/07/2014 0954   MONOABS 0.5 04/16/2022 0904   MONOABS 0.5 09/07/2014 0954   EOSABS 0.2 04/16/2022 0904   EOSABS 0.2 09/07/2014 0954   BASOSABS 0.1 04/16/2022 0904   BASOSABS 0.0 09/07/2014 0954   Comprehensive Metabolic Panel:    Component Value Date/Time   NA 136 04/16/2022 0904   NA 142 08/16/2013 1028   K 4.7 04/16/2022 0904   K 4.5 08/16/2013 1028  CL 107 04/16/2022 0904   CL 106 08/16/2013 1028   CO2 20 (L) 04/16/2022 0904   CO2 27 08/16/2013 1028   BUN 44 (H) 04/16/2022 0904   BUN 21 (H) 08/16/2013 1028   CREATININE 2.24 (H) 04/16/2022 0904   CREATININE 1.42 (H) 09/07/2014 0954   GLUCOSE 121 (H) 04/16/2022 0904   GLUCOSE 99 08/16/2013 1028   CALCIUM 10.0 04/16/2022 0904   CALCIUM 9.3 09/07/2014 0954   AST 27 04/16/2022 0904   AST 27 08/16/2013 1028   ALT 9 04/16/2022 0904   ALT 15 08/16/2013 1028   ALKPHOS 56 04/16/2022 0904   ALKPHOS 61 08/16/2013 1028   BILITOT 0.5 04/16/2022 0904   BILITOT 0.3 08/16/2013 1028   PROT 7.0 04/16/2022 0904   PROT 7.4 08/16/2013 1028   ALBUMIN 4.0 04/16/2022 0904   ALBUMIN 3.9 08/16/2013 1028     Present during today's visit: patient and her granddaughter [facility] plan: Per MD, patient will start her lenalidomide with cycle 2 (likely on or around 05/14/21)   Patient Education I spoke with patient for overview of new oral chemotherapy medication: lenalidomide   Administration: Counseled patient on administration, dosing, side effects, monitoring, drug-food interactions, safe handling, storage, and disposal. Patient will take 1 capsule (10 mg total) by mouth daily. Take for 21 days, then hold for 7 days. Repeat every 28 days.  Side Effects: Side effects include but not limited to: rash/itchy skin, N/V, fatigue, decreased wbc/hgb/plt, constipation or diarrhea.    Drug-drug Interactions (DDI): Leflunomide: lenalidomide may enhance the immunosuppressive effect of  leflunomide. The recommendation is to increase monitoring of CBC. This will already be done more frequently anyway while she is on her new treatment regimen.   Adherence: After discussion with patient no patient barriers to medication adherence identified.  Reviewed with patient importance of keeping a medication schedule and plan for any missed doses.  Ms. Astle and Anderson Malta voiced understanding and appreciation. All questions answered. Medication handout provided.  Provided patient with [facility] phone number. Patient knows to call the office with questions or concerns. Oral [facility] will continue to follow.  Patient expressed understanding and was in agreement with this plan. She also understands that She can call clinic at any time with any questions, concerns, or complaints.   Medication Access Issues: PA was approved and grant was obtained for patient. Prescription sent to [facility]  Follow-up plan: RTC as scheduled by MD  Thank you for allowing me to participate in the care of this patient.   Time Total: 25 mins  Visit consisted of counseling and education on dealing with issues of symptom management in the setting of serious and potentially life-threatening illness.Greater than 50%  of this time was spent counseling and coordinating care related to the above assessment and plan.  Signed by: Darl Pikes, PharmD, BCPS, Salley Slaughter, CPP Hematology/Oncology Clinical Pharmacist Practitioner [place]/DB/AP [facility] [phone]  04/16/2022 12:06 PM

## 2022-04-16 NOTE — Patient Instructions (Signed)
[facility]  Discharge Instructions: Thank you for choosing [facility] to provide your oncology and hematology care.  If you have a lab appointment with the Cancer Center, please go directly to the Cancer Center and check in at the registration area.  Wear comfortable clothing and clothing appropriate for easy access to any Portacath or PICC line.   We strive to give you quality time with your provider. You may need to reschedule your appointment if you arrive late (15 or more minutes).  Arriving late affects you and other patients whose appointments are after yours.  Also, if you miss three or more appointments without notifying the office, you may be dismissed from the clinic at the provider's discretion.      For prescription refill requests, have your pharmacy contact our office and allow 72 hours for refills to be completed.    Today you received the following chemotherapy and/or immunotherapy agents Darzalex      To help prevent nausea and vomiting after your treatment, we encourage you to take your nausea medication as directed.  BELOW ARE SYMPTOMS THAT SHOULD BE REPORTED IMMEDIATELY: *FEVER GREATER THAN 100.4 F (38 C) OR HIGHER *CHILLS OR SWEATING *NAUSEA AND VOMITING THAT IS NOT CONTROLLED WITH YOUR NAUSEA MEDICATION *UNUSUAL SHORTNESS OF BREATH *UNUSUAL BRUISING OR BLEEDING *URINARY PROBLEMS (pain or burning when urinating, or frequent urination) *BOWEL PROBLEMS (unusual diarrhea, constipation, pain near the anus) TENDERNESS IN MOUTH AND THROAT WITH OR WITHOUT PRESENCE OF ULCERS (sore throat, sores in mouth, or a toothache) UNUSUAL RASH, SWELLING OR PAIN  UNUSUAL VAGINAL DISCHARGE OR ITCHING   Items with * indicate a potential emergency and should be followed up as soon as possible or go to the Emergency Department if any problems should occur.  Please show the CHEMOTHERAPY ALERT CARD or IMMUNOTHERAPY ALERT CARD at check-in to  the Emergency Department and triage nurse.  Should you have questions after your visit or need to cancel or reschedule your appointment, please contact [facility]  [phone] and follow the prompts.  Office hours are 8:00 a.m. to 4:30 p.m. Monday - Friday. Please note that voicemails left after 4:00 p.m. may not be returned until the following business day.  We are closed weekends and major holidays. You have access to a nurse at all times for urgent questions. Please call the main number to the clinic [phone] and follow the prompts.  For any non-urgent questions, you may also contact your provider using MyChart. We now offer e-Visits for anyone 18 and older to request care online for non-urgent symptoms. For details visit [email or web address].   Also download the MyChart app! Go to the app store, search "MyChart", open the app, select [facility], and log in with your MyChart username and password.  Masks are optional in the cancer centers. If you would like for your care team to wear a mask while they are taking care of you, please let them know. For doctor visits, patients may have with them one support person who is at least 81 years old. At this time, visitors are not allowed in the infusion area.   

## 2022-04-16 NOTE — Progress Notes (Signed)
Has not started Acyclovir because she was unsure when to start.  Nausea that is controlled with antiemetic meds.  No appetite with stable weight.

## 2022-04-16 NOTE — Assessment & Plan Note (Addendum)
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  Kappa lambda light chain ratio-rising; Lamda light chain- 329. NOV 2023-skeletal survey no lytic lesions.  Worsening renal insufficiency; GFR 20s; and also worsening anemia hemoglobin 9.8; iron saturation 20%.   # Proceed with daratumumab subcu cycle #1-day 1 today. Labs today reviewed;  acceptable for treatment today.  Will initiate prescription for lenalidomide [lower dose; prior fatigue]; also low-dose dexamethasone 20 mg [weight gain/poor tolerance].  Given renal function-plan lenalidomide 10 mg 3 weeks on 1 week off.  Discussed with Ebony Hail.  Plan starting with second cycle.  # CKD stage IVcreatinine-creatinine 2.43/GFR-24-[Dr.Singh] Continue close monitoring -STABLE  # RA- [on Plaquenil]- STABLE.  # Anxiety/nausea- prescribed/refilled  xanax 0.25 mg qhs [further refills to PCP]  # Vaccination:  flu shot [today] Covid booster; RSV- discussed   #DISPOSITION:  # Flu shot today # Dara SQ today # 1 week-MD;  labs-cbc/cmp; MD; dara # 2 weeks-  labs-cbc/cmp; MD; dara # in 3 weeks-MD;  labs-cbc/cmp; MD; dara as per IS- Dr.B-  Cc; Dr.Hande/ Dr.Singh

## 2022-04-16 NOTE — Telephone Encounter (Signed)
Oral Oncology Patient Advocate Encounter   Received notification that prior authorization for Lenalidomide is required.   PA submitted on 12.07.23  Key [id number]  Status is pending     Berdine Addison, [place] Patient [place]  [phone] (phone) [phone] (fax) 04/16/2022 10:10 AM

## 2022-04-16 NOTE — Telephone Encounter (Addendum)
Oral Oncology Patient Advocate Encounter  Was successful in securing patient a $12,000 grant from [employer] to provide copayment coverage for Lenalidomide.  This will keep the out of pocket expense at $0.     [employer] ID: [id number]  I have spoken with the patient.   The billing information is as follows and has been shared with [facility].    RxBin: [id number] PCN: [id number] Member ID: [id number] Group ID: [id number] Dates of Eligibility: 03/17/22 through 03/17/23  Fund:  Multiple Myeloma - Medicare [place], [facility] Patient [place]  [phone] (phone) [phone] (fax) 04/16/2022 11:04 AM

## 2022-04-16 NOTE — Progress Notes (Signed)
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  No evidence of organ dysfunction; continue close surveillance.  # NOV 2023- NOV 2023- REPEAT BONE MARROW-plasma cells cell 10-20%.  Kappa lambda light chain ratio-rising; Lamda light chain- 329. NOV 2023-skeletal survey no lytic lesions.  Worsening renal insufficiency; GFR 20s; and also worsening anemia hemoglobin 9.8; iron saturation 20%.   # DEC 7th, 2023- DARA SQ+ Dex 20 mg; REV- hold cycle #1.   # CKD [~ creat 1.35- 1.7] ; Dr.Singh  # 2012- LEFT KIDNEY ? 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 benign   Multiple myeloma not having achieved remission ([place])  12/18/2015 Initial Diagnosis   Multiple myeloma in remission ([place])   04/16/2022 -  Chemotherapy   Patient is on Treatment Plan : MYELOMA RELAPSED REFRACTORY Daratumumab SQ + Lenalidomide + Dexamethasone (DaraRd) q28d       INTERVAL HISTORY: Ambulating independently.  Accompanied by her granddaughter.  This is a pleasant 81 year-old female patient with above history of relapsed multiple Myeloma [DEC 2023]-is here to proceed with daratumumab SQ treatments.  Patient complains of ongoing fatigue.  Otherwise denies any diarrhea or constipation.  Denies any worsening tingling or numbness.  Denies any Vomiting denies any fevers or chills.   Denies any  worsening back pain.  Otherwise no chest pain or shortness of breath or cough.  Review of Systems  Constitutional:  Positive for malaise/fatigue. Negative for diaphoresis, fever and weight loss.  HENT:  Negative for nosebleeds and sore throat.   Eyes:  Negative for double vision.  Respiratory:  Negative for cough, hemoptysis, sputum production, shortness of breath and wheezing.   Cardiovascular:  Negative for chest pain, palpitations, orthopnea and leg swelling.  Gastrointestinal:  Negative for abdominal pain, blood in stool, constipation, diarrhea, heartburn, melena, nausea and vomiting.  Genitourinary:  Negative for dysuria, frequency and urgency.  Musculoskeletal:  Positive for back pain, joint pain and myalgias.  Skin: Negative.  Negative for itching and rash.  Neurological:  Positive for tingling. Negative for dizziness, focal weakness, weakness and headaches.  Endo/Heme/Allergies:  Does not bruise/bleed easily.  Psychiatric/Behavioral:  Negative for depression. The patient is not nervous/anxious and does not have insomnia.     ALLERGIES:  is allergic to isoniazid and keflex [cephalexin].  MEDICATIONS:  Current Outpatient Medications  Medication Sig Dispense Refill   acetaminophen (TYLENOL) 325 MG tablet Take 650 mg by mouth every 6 (six) hours as needed for fever or headache.     allopurinol (ZYLOPRIM) 300 MG tablet Take 300 mg by mouth daily.      ALPRAZolam (XANAX) 0.25 MG tablet Take 1 tablet (0.25 mg total) by mouth at bedtime as needed for anxiety. 30 tablet 0   Ascorbic Acid (VITAMIN C) 1000 MG tablet Take 1,000 mg by mouth daily.     atenolol (TENORMIN) 50 MG tablet Take 0.5 tablets (25 mg total) by mouth daily. Briggs  tablet 0   docusate sodium (COLACE) 100 MG capsule Take 100 mg by mouth 2 (two) times daily.     DULoxetine (CYMBALTA) 20 MG capsule      ferrous sulfate 324 MG TBEC Take 324 mg by mouth.     fluticasone (FLONASE) 50 MCG/ACT nasal spray Place 2 sprays into the  nose daily.      hydroxychloroquine (PLAQUENIL) 200 MG tablet daily.     lansoprazole (PREVACID) 30 MG capsule Take 30 mg by mouth daily at 12 noon.     levothyroxine (SYNTHROID) 112 MCG tablet      MYRBETRIQ 50 MG TB24 tablet Take 50 mg by mouth daily.     ondansetron (ZOFRAN) 8 MG tablet TAKE 1 TABLET BY MOUTH EVERY 8 HOURS AS NEEDED FOR NAUSEA OR VOMITING 40 tablet 1   oxybutynin (DITROPAN-XL) 10 MG 24 hr tablet Take 1 tablet (10 mg total) by mouth daily. 30 tablet 11   rOPINIRole (REQUIP) 0.5 MG tablet Take by mouth.     vitamin B-12 (CYANOCOBALAMIN) 1000 MCG tablet Take 1,000 mcg by mouth daily.     acyclovir (ZOVIRAX) 400 MG tablet Take 1 tablet (400 mg total) by mouth 2 (two) times daily. (Patient not taking: Reported on 04/16/2022) 60 tablet 4   cetirizine (ZYRTEC) 10 MG tablet Take 10 mg by mouth 2 (two) times daily.      denosumab (PROLIA) 60 MG/ML SOSY injection Inject into the skin. (Patient not taking: Reported on 05/07/2021)     leflunomide (ARAVA) 10 MG tablet Take 1 tablet by mouth daily.     triamterene-hydrochlorothiazide (MAXZIDE-25) 37.5-25 MG tablet TK 1 T PO PRN (Patient not taking: Reported on 04/16/2022)     No current facility-administered medications for this visit.   Facility-Administered Medications Ordered in Other Visits  Medication Dose Route Frequency Provider Last Rate Last Admin   acetaminophen (TYLENOL) tablet 650 mg  650 mg Oral Once Cammie Sickle, MD       daratumumab-hyaluronidase-fihj (DARZALEX FASPRO) 1800-30000 MG-UT/15ML chemo SQ injection 1,800 mg  1,800 mg Subcutaneous Once Cammie Sickle, MD       dexamethasone (DECADRON) tablet 20 mg  20 mg Oral Once Cammie Sickle, MD       diphenhydrAMINE (BENADRYL) capsule 50 mg  50 mg Oral Once Cammie Sickle, MD       montelukast (SINGULAIR) tablet 10 mg  10 mg Oral Once Cammie Sickle, MD        PHYSICAL EXAMINATION: ECOG PERFORMANCE STATUS: 0 - Asymptomatic  BP 132/75  (BP Location: Left Arm, Patient Position: Sitting)   Pulse 70   Temp 98.8 F (37.1 C) (Tympanic)   Resp 18   Ht _0  (1.651 m)   Wt 155 lb 12.8 oz (70.7 kg)   BMI 25.93 kg/m   Filed Weights   04/16/22 0900  Weight: 155 lb 12.8 oz (70.7 kg)     Physical Exam HENT:     Head: Normocephalic and atraumatic.     Mouth/Throat:     Pharynx: No oropharyngeal exudate.  Eyes:     Pupils: Pupils are equal, round, and reactive to light.  Cardiovascular:     Rate and Rhythm: Normal rate and regular rhythm.  Pulmonary:     Effort: No respiratory distress.     Breath sounds: No wheezing.  Abdominal:     General: Bowel sounds are normal. There is no distension.     Palpations: Abdomen is soft. There is no  mass.     Tenderness: There is no abdominal tenderness. There is no guarding or rebound.  Musculoskeletal:        General: No tenderness. Normal range of motion.     Cervical back: Normal range of motion and neck supple.  Skin:    General: Skin is warm.  Neurological:     Mental Status: She is alert and oriented to person, place, and time.  Psychiatric:        Mood and Affect: Affect normal.     LABORATORY DATA:  I have reviewed the data as listed    Component Value Date/Time   NA 136 04/16/2022 0904   NA 142 08/16/2013 1028   K 4.7 04/16/2022 0904   K 4.5 08/16/2013 1028   CL 107 04/16/2022 0904   CL 106 08/16/2013 1028   CO2 20 (L) 04/16/2022 0904   CO2 27 08/16/2013 1028   GLUCOSE 121 (H) 04/16/2022 0904   GLUCOSE 99 08/16/2013 1028   BUN 44 (H) 04/16/2022 0904   BUN 21 (H) 08/16/2013 1028   CREATININE 2.24 (H) 04/16/2022 0904   CREATININE 1.42 (H) 09/07/2014 0954   CALCIUM 10.0 04/16/2022 0904   CALCIUM 9.3 09/07/2014 0954   PROT 7.0 04/16/2022 0904   PROT 7.4 08/16/2013 1028   ALBUMIN 4.0 04/16/2022 0904   ALBUMIN 3.9 08/16/2013 1028   AST 27 04/16/2022 0904   AST 27 08/16/2013 1028   ALT 9 04/16/2022 0904   ALT 15 08/16/2013 1028   ALKPHOS 56 04/16/2022  0904   ALKPHOS 61 08/16/2013 1028   BILITOT 0.5 04/16/2022 0904   BILITOT 0.3 08/16/2013 1028   GFRNONAA 22 (L) 04/16/2022 0904   GFRNONAA 36 (L) 09/07/2014 0954   GFRAA 19 (L) 02/09/2020 1001   GFRAA 42 (L) 09/07/2014 0954    No results found for: "SPEP", "UPEP"  Lab Results  Component Value Date   WBC 7.4 04/16/2022   NEUTROABS 4.7 04/16/2022   HGB 10.2 (L) 04/16/2022   HCT 31.4 (L) 04/16/2022   MCV 101.6 (H) 04/16/2022   PLT 192 04/16/2022      Chemistry      Component Value Date/Time   NA 136 04/16/2022 0904   NA 142 08/16/2013 1028   K 4.7 04/16/2022 0904   K 4.5 08/16/2013 1028   CL 107 04/16/2022 0904   CL 106 08/16/2013 1028   CO2 20 (L) 04/16/2022 0904   CO2 27 08/16/2013 1028   BUN 44 (H) 04/16/2022 0904   BUN 21 (H) 08/16/2013 1028   CREATININE 2.24 (H) 04/16/2022 0904   CREATININE 1.42 (H) 09/07/2014 0954      Component Value Date/Time   CALCIUM 10.0 04/16/2022 0904   CALCIUM 9.3 09/07/2014 0954   ALKPHOS 56 04/16/2022 0904   ALKPHOS 61 08/16/2013 1028   AST 27 04/16/2022 0904   AST 27 08/16/2013 1028   ALT 9 04/16/2022 0904   ALT 15 08/16/2013 1028   BILITOT 0.5 04/16/2022 0904   BILITOT 0.3 08/16/2013 1028       RADIOGRAPHIC STUDIES: I have personally reviewed the radiological images as listed and agreed with the findings in the report. No results found.   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  Kappa lambda  light chain ratio-rising; Lamda light chain- 329. NOV 2023-skeletal survey no lytic lesions.  Worsening renal insufficiency; GFR 20s; and also worsening anemia hemoglobin 9.8; iron saturation 20%.   # Proceed with daratumumab subcu cycle #1-day 1 today. Labs today  reviewed;  acceptable for treatment today.  Will initiate prescription for lenalidomide [lower dose; prior fatigue]; also low-dose dexamethasone 20 mg [weight gain/poor tolerance].  Given renal function-plan lenalidomide 10 mg 3 weeks on 1 week off.  Discussed with Ebony Hail.  Plan starting with second cycle.  # CKD stage IVcreatinine-creatinine 2.43/GFR-24-[Dr.Singh] Continue close monitoring -STABLE  # RA- [on Plaquenil]- STABLE.  # Anxiety/nausea- prescribed/refilled  xanax 0.25 mg qhs [further refills to PCP]  # Vaccination:  flu shot [today] Covid booster; RSV- discussed   #DISPOSITION:  # Flu shot today # Dara SQ today # 1 week-MD;  labs-cbc/cmp; MD; dara # 2 weeks-  labs-cbc/cmp; MD; dara # in 3 weeks-MD;  labs-cbc/cmp; MD; dara as per IS- Dr.B-  Cc; Dr.Hande/ Dr.Singh                Cammie Sickle, MD 04/16/2022 10:23 AM

## 2022-04-16 NOTE — Telephone Encounter (Signed)
Oral Oncology Patient Advocate Encounter  Prior Authorization for Lenalidomide has been approved.    PA# [id number]  Effective dates: 11.07.23 through 12.06.26.    Berdine Addison, [facility] Patient [place]  [phone] (phone) [phone] (fax) 04/16/2022 10:15 AM

## 2022-04-17 ENCOUNTER — Other Ambulatory Visit: Payer: Self-pay

## 2022-04-19 ENCOUNTER — Other Ambulatory Visit: Payer: Self-pay

## 2022-04-23 ENCOUNTER — Inpatient Hospital Stay: Payer: Medicare Other

## 2022-04-23 ENCOUNTER — Telehealth: Payer: Self-pay | Admitting: *Deleted

## 2022-04-23 ENCOUNTER — Ambulatory Visit
Admission: RE | Admit: 2022-04-23 | Discharge: 2022-04-23 | Disposition: A | Discharge status: Home / Self Care | Payer: Medicare Other | Source: Ambulatory Visit | Attending: Internal Medicine | Admitting: Internal Medicine

## 2022-04-23 ENCOUNTER — Inpatient Hospital Stay (HOSPITAL_BASED_OUTPATIENT_CLINIC_OR_DEPARTMENT_OTHER): Payer: Medicare Other | Admitting: Internal Medicine

## 2022-04-23 ENCOUNTER — Telehealth: Payer: Self-pay | Admitting: Internal Medicine

## 2022-04-23 ENCOUNTER — Encounter: Payer: Self-pay | Admitting: Internal Medicine

## 2022-04-23 ENCOUNTER — Other Ambulatory Visit: Payer: Self-pay

## 2022-04-23 ENCOUNTER — Telehealth: Payer: Self-pay

## 2022-04-23 ENCOUNTER — Inpatient Hospital Stay (HOSPITAL_BASED_OUTPATIENT_CLINIC_OR_DEPARTMENT_OTHER): Payer: Medicare Other | Admitting: Nurse Practitioner

## 2022-04-23 VITALS — BP 94/55 | HR 76 | Temp 100.1°F | Resp 16 | Wt 157.8 lb

## 2022-04-23 DIAGNOSIS — R059 Cough, unspecified: Secondary | ICD-10-CM | POA: Insufficient documentation

## 2022-04-23 DIAGNOSIS — Z5111 Encounter for antineoplastic chemotherapy: Secondary | ICD-10-CM | POA: Diagnosis not present

## 2022-04-23 DIAGNOSIS — R0602 Shortness of breath: Secondary | ICD-10-CM | POA: Diagnosis not present

## 2022-04-23 DIAGNOSIS — C9000 Multiple myeloma not having achieved remission: Secondary | ICD-10-CM

## 2022-04-23 DIAGNOSIS — U071 COVID-19: Secondary | ICD-10-CM | POA: Diagnosis not present

## 2022-04-23 LAB — CBC WITH DIFFERENTIAL/PLATELET
Abs Immature Granulocytes: 0.09 10*3/uL — ABNORMAL HIGH (ref 0.00–0.07)
Basophils Absolute: 0 10*3/uL (ref 0.0–0.1)
Basophils Relative: 0 %
Eosinophils Absolute: 0.1 10*3/uL (ref 0.0–0.5)
Eosinophils Relative: 1 %
HCT: 29.2 % — ABNORMAL LOW (ref 36.0–46.0)
Hemoglobin: 9.6 g/dL — ABNORMAL LOW (ref 12.0–15.0)
Immature Granulocytes: 1 %
Lymphocytes Relative: 6 %
Lymphs Abs: 0.5 10*3/uL — ABNORMAL LOW (ref 0.7–4.0)
MCH: 32.8 pg (ref 26.0–34.0)
MCHC: 32.9 g/dL (ref 30.0–36.0)
MCV: 99.7 fL (ref 80.0–100.0)
Monocytes Absolute: 0.5 10*3/uL (ref 0.1–1.0)
Monocytes Relative: 6 %
Neutro Abs: 8.2 10*3/uL — ABNORMAL HIGH (ref 1.7–7.7)
Neutrophils Relative %: 86 %
Platelets: 151 10*3/uL (ref 150–400)
RBC: 2.93 MIL/uL — ABNORMAL LOW (ref 3.87–5.11)
RDW: 14.5 % (ref 11.5–15.5)
WBC: 9.5 10*3/uL (ref 4.0–10.5)
nRBC: 0 % (ref 0.0–0.2)

## 2022-04-23 LAB — COMPREHENSIVE METABOLIC PANEL
ALT: 9 U/L (ref 0–44)
AST: 28 U/L (ref 15–41)
Albumin: 3.6 g/dL (ref 3.5–5.0)
Alkaline Phosphatase: 48 U/L (ref 38–126)
Anion gap: 12 (ref 5–15)
BUN: 41 mg/dL — ABNORMAL HIGH (ref 8–23)
CO2: 23 mmol/L (ref 22–32)
Calcium: 9.2 mg/dL (ref 8.9–10.3)
Chloride: 97 mmol/L — ABNORMAL LOW (ref 98–111)
Creatinine, Ser: 2.26 mg/dL — ABNORMAL HIGH (ref 0.44–1.00)
GFR, Estimated: 21 mL/min — ABNORMAL LOW (ref >60)
Glucose, Bld: 104 mg/dL — ABNORMAL HIGH (ref 70–99)
Potassium: 4.1 mmol/L (ref 3.5–5.1)
Sodium: 132 mmol/L — ABNORMAL LOW (ref 135–145)
Total Bilirubin: 0.6 mg/dL (ref 0.3–1.2)
Total Protein: 6.6 g/dL (ref 6.5–8.1)

## 2022-04-23 LAB — RESP PANEL BY RT-PCR (RSV, FLU A&B, COVID)  RVPGX2
Influenza A by PCR: NEGATIVE
Influenza B by PCR: NEGATIVE
Resp Syncytial Virus by PCR: NEGATIVE
SARS Coronavirus 2 by RT PCR: POSITIVE — AB

## 2022-04-23 MED ORDER — NIRMATRELVIR/RITONAVIR (PAXLOVID) TABLET (RENAL DOSING): 2.0000 | ORAL_TABLET | Freq: Two times a day (BID) | ORAL | 0 refills | Status: AC

## 2022-04-23 NOTE — Progress Notes (Signed)
Virtual Visit Progress Note  Symptom Management Sydney Gillespie at [place]. [facility] [address], [place], [place] [address] [phone]) [phone] (fax)  I connected with Sydney Gillespie on 04/24/22 at  9:00 AM EST by telephone visit and verified that I am speaking with the correct person using two identifiers.   I discussed the limitations, risks, security and privacy concerns of performing an evaluation and management service by telemedicine and the availability of in-person appointments. I also discussed with the patient that there may be a patient responsible charge related to this service. The patient expressed understanding and agreed to proceed.   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  No evidence of organ dysfunction; continue close surveillance.  # NOV 2023- NOV 2023- REPEAT BONE MARROW-plasma cells cell 10-20%.  Kappa lambda light chain ratio-rising; Lamda light chain- 329. NOV 2023-skeletal survey no lytic lesions.   Worsening renal insufficiency; GFR 20s; and also worsening anemia hemoglobin 9.8; iron saturation 20%.   # DEC 7th, 2023- DARA SQ+ Dex 20 mg; REV- hold cycle #1.   # CKD [~ creat 1.35- 1.7] ; Dr.Singh  # 2012- LEFT KIDNEY ? 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 benign   Multiple myeloma not having achieved remission ([place])  12/18/2015 Initial Diagnosis   Multiple myeloma in remission ([place])   04/16/2022 -  Chemotherapy   Patient is on Treatment Plan : MYELOMA RELAPSED REFRACTORY Daratumumab SQ + Lenalidomide + Dexamethasone (DaraRd) q28d       Interval history- Patient is 81 year old female who agrees to evaluation via telephone for covid positive test. On 12/13 she developed sneezing, productive cough, body aches, fever of 103.5, loss of appetite. She was seen in clinic by Dr Rogue Bussing who performed RVP testing which was positive for covid. She has previously been vaccinated but has not received recent booster. Has not started revlimid. Denies chest pain. Endorses fatigue. No shortness of breath. Has been monitoring her vital signs at home. Granddaughter Anderson Malta is assisting with medications.   Review of systems- Review of Systems  Constitutional:  Positive for chills, fever, malaise/fatigue and weight loss. Negative for diaphoresis.  HENT:  Positive for congestion, sinus pain and sore throat. Negative for ear discharge, ear pain and nosebleeds.   Eyes:  Negative for pain and redness.  Respiratory:  Positive for cough and sputum production. Negative for hemoptysis, shortness of breath, wheezing and stridor.   Cardiovascular:  Negative for chest pain, palpitations and leg swelling.  Gastrointestinal:  Positive for nausea. Negative for abdominal pain, constipation, diarrhea and vomiting.  Musculoskeletal:  Negative for falls, joint pain and myalgias.  Skin:  Negative for rash.  Neurological:  Positive for weakness. Negative for dizziness, loss of consciousness and  headaches.  Psychiatric/Behavioral:  Negative for depression. The patient is not nervous/anxious and does not have insomnia.   All other systems reviewed and are negative.    Allergies  Allergen Reactions   Isoniazid Other (See Comments)    Blisters    Past Medical History:  Diagnosis Date   Allergic rhinitis    Allergy    Anemia    Anemia    Barrett's esophagus    Blood dyscrasia    multiple myloma remission   Cancer ([place])    Change in bowel habits    Compression fracture 2013   Degenerative disc disease, lumbar    Dysrhythmia    palpitations   Esophageal reflux    Family history of adverse reaction to anesthesia    nausea -mom   GERD (gastroesophageal reflux disease)    Gout    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[place])    Heart palpitations    Hyperlipidemia    Hypothyroidism    Inflammatory polyarthropathy (HCC)    Lumbago    Lumbar radiculitis    Lumbar stenosis with neurogenic claudication    Multiple myeloma (HCC)    Multiple myeloma ([place])    Osteopenia    Osteoporosis    Other dysphagia    Palpitations    Positive PPD    Renal insufficiency    Rheumatoid arthritis (HCC)    Rheumatoid arthritis (HCC)    Seasonal allergies    Vertigo    Past Surgical History:  Procedure Laterality Date   ABDOMINAL HYSTERECTOMY     APPENDECTOMY     BACK SURGERY     BREAST EXCISIONAL BIOPSY Bilateral    neg   CHOLECYSTECTOMY     COLONOSCOPY WITH PROPOFOL N/A 11/12/2016   Procedure: COLONOSCOPY WITH PROPOFOL;  Surgeon: Manya Silvas, MD;  Location: [facility] ENDOSCOPY;  Service: Endoscopy;  Laterality: N/A;   ESOPHAGOGASTRODUODENOSCOPY (EGD) WITH PROPOFOL N/A 11/12/2016   Procedure: ESOPHAGOGASTRODUODENOSCOPY (EGD) WITH PROPOFOL;  Surgeon: Manya Silvas, MD;  Location: [facility] ENDOSCOPY;  Service: Endoscopy;  Laterality: N/A;   FOOT SURGERY     LUMBAR LAMINECTOMY/DECOMPRESSION MICRODISCECTOMY Left 06/18/2016   Procedure: LAMINECTOMY FOR FACET/SYNOVIAL CYST LUMBAR THREE - LUMBAR  FOUR LEFT;  Surgeon: Newman Pies, MD;  Location: [place];  Service: Neurosurgery;  Laterality: Left;  LAMINECTOMY FOR FACET/SYNOVIAL CYST LUMBAR THREE - LUMBAR FOUR LEFT   Social History   Socioeconomic History   Marital status: Widowed    Spouse name: Not on file   Number of children: Not on file   Years of education: Not on file   Highest education level: Not on file  Occupational History   Not on file  Tobacco Use   Smoking status: Never   Smokeless tobacco: Never  Vaping Use   Vaping Use: Never used  Substance and Sexual Activity   Alcohol use: No   Drug use: No   Sexual activity: Yes  Other Topics Concern   Not on file  Social History Narrative   Not on file   Social Determinants of Health   Financial Resource Strain: Not on file  Food Insecurity: Not on file  Transportation Needs: Not on file  Physical Activity: Not on file  Stress: Not on file  Social Connections: Not on file  Intimate Partner Violence: Not on file   Family History  Problem Relation Age of Onset   Hypertension Mother    Heart attack Mother    Rheum arthritis Mother    Osteoarthritis Mother    Cancer Father    Hypertension Father    Stroke Father    Breast cancer Maternal Grandmother 21   Osteoarthritis Other    Rheum arthritis Other     Current Outpatient Medications:    acetaminophen (TYLENOL) 325 MG tablet, Take 650 mg by mouth every 6 (six) hours as needed for fever or headache., Disp: , Rfl:    acyclovir (ZOVIRAX) 400 MG tablet, Take 1 tablet (400 mg total) by mouth 2 (two) times daily., Disp: 60 tablet, Rfl: 4   allopurinol (ZYLOPRIM) 300 MG tablet, Take 300 mg by mouth daily. , Disp: , Rfl:    ALPRAZolam (XANAX) 0.25 MG tablet, Take 1 tablet (0.25 mg total) by mouth at bedtime as needed for anxiety., Disp: 30 tablet, Rfl: 0   Ascorbic Acid (VITAMIN C) 1000 MG tablet, Take 1,000 mg by mouth daily., Disp: , Rfl:    atenolol (TENORMIN) 50 MG tablet, Take 0.5 tablets (25 mg total) by  mouth daily., Disp: 30 tablet, Rfl: 0   CALCIUM MAGNESIUM ZINC PO, Take 1 tablet by mouth in the morning, at noon, and at bedtime., Disp: , Rfl:    cetirizine (ZYRTEC) 10 MG tablet, Take 10 mg by mouth 2 (two) times daily. , Disp: , Rfl:    denosumab (PROLIA) 60 MG/ML SOSY injection, Inject into the skin. (Patient not taking: Reported on 05/07/2021), Disp: , Rfl:    docusate sodium (COLACE) 100 MG capsule, Take 100 mg by mouth 2 (two) times daily., Disp: , Rfl:    DULoxetine (CYMBALTA) 20 MG capsule, , Disp: , Rfl:    ferrous sulfate 324 MG TBEC, Take 324 mg by mouth. (Patient not taking: Reported on 04/23/2022), Disp: , Rfl:    fluticasone (FLONASE) 50 MCG/ACT nasal spray, Place 2 sprays into the nose daily. , Disp: , Rfl:    hydroxychloroquine (PLAQUENIL) 200 MG tablet, daily., Disp: , Rfl:    lansoprazole (PREVACID) 30 MG capsule, Take 30 mg by mouth daily at 12 noon., Disp: , Rfl:    leflunomide (ARAVA) 10 MG tablet, Take 1 tablet by mouth daily., Disp: , Rfl:    lenalidomide (REVLIMID) 10 MG capsule, Take 1 capsule (10 mg total) by mouth daily. Take for 21 days, then hold for 7 days. Repeat every 28 days. (Patient not taking: Reported on 04/23/2022), Disp: 21 capsule, Rfl: 0   levothyroxine (SYNTHROID) 112 MCG tablet, , Disp: , Rfl:    MYRBETRIQ 50 MG TB24 tablet, Take 50 mg by mouth daily., Disp: , Rfl:    ondansetron (ZOFRAN) 8 MG tablet, TAKE 1 TABLET BY MOUTH EVERY 8 HOURS AS NEEDED FOR NAUSEA OR VOMITING, Disp: 40 tablet, Rfl: 1   oxybutynin (DITROPAN-XL) 10 MG 24 hr tablet, Take 1 tablet (10 mg total) by mouth daily., Disp: 30 tablet, Rfl: 11   Potassium 99 MG TABS, Take 1 tablet by mouth daily., Disp: , Rfl:    rOPINIRole (REQUIP) 0.5 MG tablet, Take by mouth., Disp: , Rfl:    triamterene-hydrochlorothiazide (MAXZIDE-25) 37.5-25 MG tablet, TK 1 T PO PRN (Patient not taking: Reported on 04/16/2022), Disp: , Rfl:    vitamin B-12 (CYANOCOBALAMIN) 1000 MCG tablet, Take 1,000 mcg by mouth  daily., Disp: , Rfl:   Physical exam: Exam limited due to telemedicine  There were no vitals filed for this visit. Physical Exam  Constitutional:      Comments: Sounds fatigued. Productive cough heard  HENT:     Nose: Congestion present.  Neurological:     Mental Status: She is oriented to person, place, and time.        Latest Ref Rng & Units 04/23/2022    8:25 AM  CMP  Glucose 70 - 99 mg/dL 104   BUN 8 - 23 mg/dL 41   Creatinine 0.44 - 1.00 mg/dL 2.26   Sodium 135 - 145 mmol/L 132   Potassium 3.5 - 5.1 mmol/L 4.1   Chloride 98 - 111 mmol/L 97   CO2 22 - 32 mmol/L 23   Calcium 8.9 - 10.3 mg/dL 9.2   Total Protein 6.5 - 8.1 g/dL 6.6   Total Bilirubin 0.3 - 1.2 mg/dL 0.6   Alkaline Phos 38 - 126 U/L 48   AST 15 - 41 U/L 28   ALT 0 - 44 U/L 9       Latest Ref Rng & Units 04/23/2022    8:25 AM  CBC  WBC 4.0 - 10.5 K/uL 9.5   Hemoglobin 12.0 - 15.0 g/dL 9.6   Hematocrit 36.0 - 46.0 % 29.2   Platelets 150 - 400 K/uL 151    DG Chest 2 View  Result Date: 04/23/2022 CLINICAL DATA:  Hypotension, shortness of breath EXAM: CHEST - 2 VIEW COMPARISON:  Chest radiograph 11/27/2018 FINDINGS: The cardiomediastinal silhouette is normal. There is no focal consolidation or pulmonary edema. There is no pleural effusion or pneumothorax. There is no acute osseous abnormality. IMPRESSION: No radiographic evidence of acute cardiopulmonary process. Electronically Signed   By: Valetta Mole M.D.   On: 04/23/2022 10:50   DG Bone Survey Met  Result Date: 04/08/2022 CLINICAL DATA:  History of multiple myeloma EXAM: METASTATIC BONE SURVEY COMPARISON:  02/15/2020 FINDINGS: Metastatic bone survey was performed. Cardiac shadow is within normal limits. The lungs are well aerated bilaterally. No lytic or sclerotic lesions are noted in the chest. Postsurgical changes are noted in the cervical spine stable in appearance from the prior study. No lytic or sclerotic lesions are seen. Multilevel degenerative  changes are noted. Patient is edentulous. No significant lucencies are noted within the skull. Degenerative changes about shoulder joints are seen right greater than left. The upper extremities show no focal lytic or sclerotic lesions. Thoracic and lumbar spine demonstrate degenerative changes. T12 compression fracture is noted. This is chronic in nature. No other compression deformity is seen. InterStim device is noted in the pelvis. Postsurgical changes in the lower lumbar spine are noted. No lytic or sclerotic lesions are seen in the lower extremities. IMPRESSION: No lytic or sclerotic lesions are identified. Electronically Signed   By: Inez Catalina M.D.   On: 04/08/2022 19:53   CT BONE MARROW BIOPSY & ASPIRATION  Result Date: 03/31/2022 INDICATION: 81 year old with multiple myeloma. EXAM: CT GUIDED BONE MARROW ASPIRATES AND BIOPSY Physician: Stephan Minister. Henn, MD MEDICATIONS: Moderate sedation ANESTHESIA/SEDATION: Moderate (conscious) sedation was employed during this procedure. A total of Versed 71m and fentanyl 50 mcg was administered intravenously at the order of the provider performing the procedure. Total intra-service moderate sedation time: 12 minutes. Patient's level of consciousness and vital signs were monitored continuously by radiology nurse throughout the procedure under the supervision of the provider performing the procedure. COMPLICATIONS: None immediate. PROCEDURE: The procedure was explained to the patient. The risks and benefits of the procedure were discussed and the patient's questions were addressed. Informed consent was obtained from  the patient. The patient was placed prone on CT table. Images of the pelvis were obtained. The left side of back was prepped and draped in sterile fashion. The skin and left posterior ilium were anesthetized with 1% lidocaine. 11 gauge bone needle was directed into the left ilium with CT guidance. Two aspirates and one core biopsy were obtained. Bandage placed  over the puncture site. RADIATION DOSE REDUCTION: This exam was performed according to the departmental dose-optimization program which includes automated exposure control, adjustment of the mA and/or kV according to patient size and/or use of iterative reconstruction technique. IMPRESSION: CT guided bone marrow aspiration and core biopsy. Electronically Signed   By: Markus Daft M.D.   On: 03/31/2022 10:06    Assessment and plan- Patient is a 81 y.o. female    1) Covid-19 Virus Infection - Keep yourself hydrated with a lot of water and rest.  - chest xray was negative for viral pneumonia - Take Delsym for cough and Mucinex to thin secretions - Flonase or nasocort as needed/directed per package instructions. Afrin for nasal congestion no longer than 5 days.  - caution with otc decongestants that may worsen hypertension such as pseudoephedrine. - Take Tylenol or pain reliever every 4-6 hours as needed for pain/fever/body ache. Don't exceed 3000 mg in 24 hour period - Reviewed symptoms that would warrant ER or hospital care.  - Encouraged patient to monitor oxygen level with pulse oximeter, how to troubleshoot, and to seek ER care for SpO2 < 92%.  - Encouraged her to monitor blood pressure- seek ER evaluation for top number < 90.  - Monitor heart rate- notify clinic or seek ER care for heart rate > 110.  - CDC criteria for quarantine and isolation reviewed.   - I advised patient to stay well hydrated, particularly in the event of sustained or high fevers, in whom insensible fluid losses may be significant  - Cough that is persistent, interferes with sleep, or causes discomfort can be managed with an over-the-counter cough medication (eg, dextromethorphan) or prescription benzonatate, 100 to 200 mg orally three times daily as needed. Will also send prescription for Tussionex q12h as needed. Narcotic precautions reviewed.  - I recommend rest as needed during the acute illness and frequent repositioning  ambulation is encouraged. In addition, we encourage all patients to advance activity as soon as tolerated during recovery. - Start paxlovid, 3 tablets twice a day x 5 days. Side effects reviewed. Also discussed that paxlovid is less effective at preventing severe illness in most widely prevalent strains as compared to strains of last year and prior. However, patient is high risk of severe infection and I would recommend antiviral. Patient agrees. Applauded patient for being vaccinated. Would recommend booster in 3 months. Reviewed that patient's GFR is decreased however, given that she is high risk of severe infection, will proceed with renally dosed paxlovid- 2 tablets BID x 5 days. Dr Rogue Bussing agreed. She has not yet started medication but I encouraged her to do so. Reviewed side effects.   2) Multiple Myeloma - not having achieved remission. Hold revlimid.   Disposition:  Follow up as scheduled or sooner as needed  Visit Diagnosis 1. COVID-19 virus infection   2. Multiple myeloma not having achieved remission [facility])    Patient expressed understanding and was in agreement with this plan. She also understands that She can call clinic at any time with any questions, concerns, or complaints.   I discussed the assessment and treatment plan with  the patient. The patient was provided an opportunity to ask questions and all were answered. The patient agreed with the plan and demonstrated an understanding of the instructions.   The patient was advised to call back or seek an in-person evaluation if the symptoms worsen or if the condition fails to improve as anticipated.   I spent 25 minutes on this telephone encounter.   Thank you for allowing me to participate in the care of this very pleasant patient.   Beckey Rutter, DNP, AGNP-C [facility]

## 2022-04-23 NOTE — Telephone Encounter (Signed)
Contacted patient. She is aware that Lauren will call her later this evening to discuss the paxlovid. She understands to go ahead and pick up the script from her pharmacy and she will be starting these tablets tonight. Pt gave verbal understanding of the plan.

## 2022-04-23 NOTE — Telephone Encounter (Signed)
Spoke to pt's daughter re: pt's recent COVID infection- given borderline low BP; given immuno-compromised status- recommend close monitoring of vital signs.  If clinically worse- recommend evaluation in ER.  GB

## 2022-04-23 NOTE — Telephone Encounter (Signed)
Respiratory panel obtained today positive for COVID.    Patient will be scheduled for virtual visit with Lauren A, NP in [facility].  Anderson Malta, granddaughter, aware.

## 2022-04-23 NOTE — Progress Notes (Signed)
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  No evidence of organ dysfunction; continue close surveillance.  # NOV 2023- NOV 2023- REPEAT BONE MARROW-plasma cells cell 10-20%.  Kappa lambda light chain ratio-rising; Lamda light chain- 329. NOV 2023-skeletal survey no lytic lesions.  Worsening renal insufficiency; GFR 20s; and also worsening anemia hemoglobin 9.8; iron saturation 20%.   # DEC 7th, 2023- DARA SQ+ Dex 20 mg; REV- hold cycle #1.   # CKD [~ creat 1.35- 1.7] ; Dr.Singh  # 2012- LEFT KIDNEY ? 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 benign   Multiple myeloma not having achieved remission ([place])  12/18/2015 Initial Diagnosis   Multiple myeloma in remission ([place])   04/16/2022 -  Chemotherapy   Patient is on Treatment Plan : MYELOMA RELAPSED REFRACTORY Daratumumab SQ + Lenalidomide + Dexamethasone (DaraRd) q28d       INTERVAL HISTORY: Ambulating independently.  Accompanied by her granddaughter.  This is a pleasant 81 year-old female patient with above history of relapsed multiple Myeloma [DEC 2023]-currently on daratumumab SQ treatments he is here for follow-up.  Patient received daratumumab cycle number 1 day one 1 week ago.  However started feeling bad yesterday with sneezing, productive cough, all over body aches temp of 103.5 then 102.5  1 hour after taking 2 tylenol.  Complains of shortness of breath especially exertion.  No wheezing. No appetite. Constipation with good bowel movement this morning.  Daughter has nasal congestion.  No fevers.  Otherwise no sick contacts.   Has not started Revlimid and has not received yet.   Review of Systems  Constitutional:  Positive for fever and malaise/fatigue. Negative for diaphoresis and weight loss.  HENT:  Negative for nosebleeds and sore throat.   Eyes:  Negative for double vision.  Respiratory:  Positive for cough, sputum production and shortness of breath. Negative for hemoptysis and wheezing.   Cardiovascular:  Negative for chest pain, palpitations, orthopnea and leg swelling.  Gastrointestinal:  Positive for nausea. Negative for abdominal pain, blood in stool, constipation, diarrhea, heartburn, melena and vomiting.  Genitourinary:  Negative for dysuria, frequency and urgency.  Musculoskeletal:  Positive for back pain, joint pain and myalgias.  Skin: Negative.  Negative for itching and rash.  Neurological:  Positive for tingling. Negative for dizziness, focal weakness, weakness and headaches.  Endo/Heme/Allergies:  Does not bruise/bleed easily.  Psychiatric/Behavioral:  Negative for depression. The patient is not nervous/anxious and does not have insomnia.     ALLERGIES:  is allergic to isoniazid and keflex [cephalexin].  MEDICATIONS:  Current Outpatient Medications  Medication Sig Dispense Refill   acetaminophen (TYLENOL) 325 MG tablet Take 650 mg by mouth every 6 (six) hours as needed for fever or headache.     acyclovir (ZOVIRAX) 400 MG tablet Take 1 tablet (400 mg total) by mouth 2 (two) times daily. 60 tablet 4   allopurinol (ZYLOPRIM) 300 MG tablet Take 300  mg by mouth daily.      ALPRAZolam (XANAX) 0.25 MG tablet Take 1 tablet (0.25 mg total) by mouth at bedtime as needed for anxiety. 30 tablet 0   Ascorbic Acid (VITAMIN C) 1000 MG tablet Take 1,000 mg by mouth daily.      atenolol (TENORMIN) 50 MG tablet Take 0.5 tablets (25 mg total) by mouth daily. 30 tablet 0   CALCIUM MAGNESIUM ZINC PO Take 1 tablet by mouth in the morning, at noon, and at bedtime.     cetirizine (ZYRTEC) 10 MG tablet Take 10 mg by mouth 2 (two) times daily.      docusate sodium (COLACE) 100 MG capsule Take 100 mg by mouth 2 (two) times daily.     DULoxetine (CYMBALTA) 20 MG capsule      fluticasone (FLONASE) 50 MCG/ACT nasal spray Place 2 sprays into the nose daily.      hydroxychloroquine (PLAQUENIL) 200 MG tablet daily.     lansoprazole (PREVACID) 30 MG capsule Take 30 mg by mouth daily at 12 noon.     leflunomide (ARAVA) 10 MG tablet Take 1 tablet by mouth daily.     levothyroxine (SYNTHROID) 112 MCG tablet      MYRBETRIQ 50 MG TB24 tablet Take 50 mg by mouth daily.     ondansetron (ZOFRAN) 8 MG tablet TAKE 1 TABLET BY MOUTH EVERY 8 HOURS AS NEEDED FOR NAUSEA OR VOMITING 40 tablet 1   oxybutynin (DITROPAN-XL) 10 MG 24 hr tablet Take 1 tablet (10 mg total) by mouth daily. 30 tablet 11   Potassium 99 MG TABS Take 1 tablet by mouth daily.     rOPINIRole (REQUIP) 0.5 MG tablet Take by mouth.     vitamin B-12 (CYANOCOBALAMIN) 1000 MCG tablet Take 1,000 mcg by mouth daily.     denosumab (PROLIA) 60 MG/ML SOSY injection Inject into the skin. (Patient not taking: Reported on 05/07/2021)     ferrous sulfate 324 MG TBEC Take 324 mg by mouth. (Patient not taking: Reported on 04/23/2022)     lenalidomide (REVLIMID) 10 MG capsule Take 1 capsule (10 mg total) by mouth daily. Take for 21 days, then hold for 7 days. Repeat every 28 days. (Patient not taking: Reported on 04/23/2022) 21 capsule 0   triamterene-hydrochlorothiazide (MAXZIDE-25) 37.5-25 MG tablet TK 1 T PO PRN (Patient not taking: Reported on 04/16/2022)     No current facility-administered medications for this visit.    PHYSICAL EXAMINATION: ECOG PERFORMANCE STATUS: 0 - Asymptomatic  BP (!) 94/55 (BP Location: Left Arm, Patient  Position: Sitting)   Pulse 76   Temp 100.1 F (37.8 C) (Tympanic)   Resp 16   Wt 157 lb 12.8 oz (71.6 kg)   SpO2 95%   BMI 26.26 kg/m   Filed Weights   04/23/22 0800  Weight: 157 lb 12.8 oz (71.6 kg)   Patient appears weak.   Physical Exam HENT:     Head: Normocephalic and atraumatic.     Mouth/Throat:     Pharynx: No oropharyngeal exudate.  Eyes:     Pupils: Pupils are equal, round, and reactive to light.  Cardiovascular:     Rate and Rhythm: Normal rate and regular rhythm.  Pulmonary:     Effort: No respiratory distress.     Breath sounds: No wheezing.  Abdominal:     General: Bowel sounds are normal. There is no distension.     Palpations: Abdomen is soft. There is no mass.     Tenderness:  There is no abdominal tenderness. There is no guarding or rebound.  Musculoskeletal:        General: No tenderness. Normal range of motion.     Cervical back: Normal range of motion and neck supple.  Skin:    General: Skin is warm.  Neurological:     Mental Status: She is alert and oriented to person, place, and time.  Psychiatric:        Mood and Affect: Affect normal.     LABORATORY DATA:  I have reviewed the data as listed    Component Value Date/Time   NA 132 (L) 04/23/2022 0825   NA 142 08/16/2013 1028   K 4.1 04/23/2022 0825   K 4.5 08/16/2013 1028   CL 97 (L) 04/23/2022 0825   CL 106 08/16/2013 1028   CO2 23 04/23/2022 0825   CO2 27 08/16/2013 1028   GLUCOSE 104 (H) 04/23/2022 0825   GLUCOSE 99 08/16/2013 1028   BUN 41 (H) 04/23/2022 0825   BUN 21 (H) 08/16/2013 1028   CREATININE 2.26 (H) 04/23/2022 0825   CREATININE 1.42 (H) 09/07/2014 0954   CALCIUM 9.2 04/23/2022 0825   CALCIUM 9.3 09/07/2014 0954   PROT 6.6 04/23/2022 0825   PROT 7.4 08/16/2013 1028   ALBUMIN 3.6 04/23/2022 0825   ALBUMIN 3.9 08/16/2013 1028   AST 28 04/23/2022 0825   AST 27 08/16/2013 1028   ALT 9 04/23/2022 0825   ALT 15 08/16/2013 1028   ALKPHOS 48 04/23/2022 0825   ALKPHOS  61 08/16/2013 1028   BILITOT 0.6 04/23/2022 0825   BILITOT 0.3 08/16/2013 1028   GFRNONAA 21 (L) 04/23/2022 0825   GFRNONAA 36 (L) 09/07/2014 0954   GFRAA 19 (L) 02/09/2020 1001   GFRAA 42 (L) 09/07/2014 0954    No results found for: "SPEP", "UPEP"  Lab Results  Component Value Date   WBC 9.5 04/23/2022   NEUTROABS 8.2 (H) 04/23/2022   HGB 9.6 (L) 04/23/2022   HCT 29.2 (L) 04/23/2022   MCV 99.7 04/23/2022   PLT 151 04/23/2022      Chemistry      Component Value Date/Time   NA 132 (L) 04/23/2022 0825   NA 142 08/16/2013 1028   K 4.1 04/23/2022 0825   K 4.5 08/16/2013 1028   CL 97 (L) 04/23/2022 0825   CL 106 08/16/2013 1028   CO2 23 04/23/2022 0825   CO2 27 08/16/2013 1028   BUN 41 (H) 04/23/2022 0825   BUN 21 (H) 08/16/2013 1028   CREATININE 2.26 (H) 04/23/2022 0825   CREATININE 1.42 (H) 09/07/2014 0954      Component Value Date/Time   CALCIUM 9.2 04/23/2022 0825   CALCIUM 9.3 09/07/2014 0954   ALKPHOS 48 04/23/2022 0825   ALKPHOS 61 08/16/2013 1028   AST 28 04/23/2022 0825   AST 27 08/16/2013 1028   ALT 9 04/23/2022 0825   ALT 15 08/16/2013 1028   BILITOT 0.6 04/23/2022 0825   BILITOT 0.3 08/16/2013 1028       RADIOGRAPHIC STUDIES: I have personally reviewed the radiological images as listed and agreed with the findings in the report. No results found.   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  Kappa lambda light chain ratio-rising; Lamda  light chain- 329. NOV 2023-skeletal survey no lytic lesions.  Worsening renal insufficiency; GFR 20s; and also worsening anemia hemoglobin 9.8; iron saturation 20%.  Currently on daratumumab subcu with plan to add revlimid with cycle #2.   # HOLD cycle   #1-day 8 today. Labs today reviewed;  given acute issues below.  #  Fever/cough/ myalgias/ nausea/ border line low Blood pressures [sytolic 25O]; O2 sat- 03% on RA-likely respiratory upper URI versus acute bronchitis/flu/COVID/RSV.-Recommend checking chest x-ray.  Respiratory virus panel.   # CKD stage IVcreatinine-creatinine 2.43/GFR-24-[Dr.Singh] Continue close monitoring -stable.  Discussed importance of continued hydration especially with low blood pressures.  Discussed regarding checking her blood pressures at home.  And call us if the blood pressures drop less than 90; or heart rate above 110.  Also recommend getting oxygen levels at home-calling us if all the levels less than 90.   # RA- [on Plaquenil]- STABLE.  # Anxiety/nausea- prescribed/refilled  xanax 0.25 mg qhs [further refills to PCP]-continue Zofran as needed.  # Vaccination:  flu shot s/p;  Covid booster; RSV- discussed   #DISPOSITION:  # cxr; flu-covid-rsv testing # HOLD Dara SQ today # follow up in 2 weeks- MD;  labs-cbc/cmp; MD; dara as per IS # follow up in 3 weeks;  labs-cbc/cmp;  dara as per IS # follow up in 4 week-  MD;  labs-cbc/cmp; Multiple myeloma panel; k/l light chains-MD; dara as per IS-  Dr.B-  Cc; Dr.Hande/ Dr.Singh                Cammie Sickle, MD 04/23/2022 9:34 AM

## 2022-04-23 NOTE — Progress Notes (Signed)
Started feeling bad yesterday with sneezing, productive cough, all over body aches temp of 103.5 then 102.5 1 hour after taking 2 tylenol.    No appetite  Constipation with good bowel movement this morning.    Has not started Revlimid and has not received yet.

## 2022-04-23 NOTE — Assessment & Plan Note (Addendum)
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  Kappa lambda light chain ratio-rising; Lamda light chain- 329. NOV 2023-skeletal survey no lytic lesions.  Worsening renal insufficiency; GFR 20s; and also worsening anemia hemoglobin 9.8; iron saturation 20%.  Currently on daratumumab subcu with plan to add revlimid with cycle #2.   # HOLD cycle  #1-day 8 today. Labs today reviewed;  given acute issues below.  #  Fever/cough/ myalgias/ nausea/ border line low Blood pressures [sytolic 11B]; O2 sat- 52% on RA-likely respiratory upper URI versus acute bronchitis/flu/COVID/RSV.-Recommend checking chest x-ray.  Respiratory virus panel.   # CKD stage IVcreatinine-creatinine 2.43/GFR-24-[Dr.Singh] Continue close monitoring -stable.  Discussed importance of continued hydration especially with low blood pressures.  Discussed regarding checking her blood pressures at home.  And call us if the blood pressures drop less than 90; or heart rate above 110.  Also recommend getting oxygen levels at home-calling us if all the levels less than 90.   # RA- [on Plaquenil]- STABLE.  # Anxiety/nausea- prescribed/refilled  xanax 0.25 mg qhs [further refills to PCP]-continue Zofran as needed.  # Vaccination:  flu shot s/p;  Covid booster; RSV- discussed   #DISPOSITION:  # cxr; flu-covid-rsv testing # HOLD Dara SQ today # follow up in 2 weeks- MD;  labs-cbc/cmp; MD; dara as per IS # follow up in 3 weeks;  labs-cbc/cmp;  dara as per IS # follow up in 4 week-  MD;  labs-cbc/cmp; Multiple myeloma panel; k/l light chains-MD; dara as per IS-  Dr.B-  Addendum: COVID-positive.  Negative for flu/RSV. CXR- negative for any acute process. Would recommend evaluation with [facility] for COVID treatment.   Cc; Dr.Hande/ Dr.Singh

## 2022-04-23 NOTE — Patient Instructions (Signed)
#   chest X-ray  #  flu-covid-rsv testing

## 2022-04-24 ENCOUNTER — Other Ambulatory Visit: Payer: Self-pay | Admitting: Internal Medicine

## 2022-04-24 ENCOUNTER — Other Ambulatory Visit: Payer: Self-pay

## 2022-04-24 ENCOUNTER — Encounter: Payer: Self-pay | Admitting: Internal Medicine

## 2022-04-24 MED ORDER — HYDROCOD POLI-CHLORPHE POLI ER 10-8 MG/5ML PO SUER: 5.0000 mL | Freq: Two times a day (BID) | ORAL | 0 refills | Status: DC | PRN

## 2022-04-24 MED ORDER — BENZONATATE 200 MG PO CAPS: 200.0000 mg | ORAL_CAPSULE | Freq: Three times a day (TID) | ORAL | 0 refills | Status: DC | PRN

## 2022-04-25 ENCOUNTER — Other Ambulatory Visit: Payer: Self-pay

## 2022-04-25 ENCOUNTER — Emergency Department: Payer: Medicare Other

## 2022-04-25 ENCOUNTER — Inpatient Hospital Stay: Payer: Medicare Other

## 2022-04-25 ENCOUNTER — Inpatient Hospital Stay
Admission: EM | Admit: 2022-04-25 | Discharge: 2022-04-27 | DRG: 178 | Disposition: A | Discharge status: Home Health | Payer: Medicare Other | Attending: Internal Medicine | Admitting: Internal Medicine

## 2022-04-25 DIAGNOSIS — R0602 Shortness of breath: Secondary | ICD-10-CM | POA: Diagnosis present

## 2022-04-25 DIAGNOSIS — Z7969 Long term (current) use of other immunomodulators and immunosuppressants: Secondary | ICD-10-CM

## 2022-04-25 DIAGNOSIS — D509 Iron deficiency anemia, unspecified: Secondary | ICD-10-CM | POA: Diagnosis not present

## 2022-04-25 DIAGNOSIS — K219 Gastro-esophageal reflux disease without esophagitis: Secondary | ICD-10-CM | POA: Diagnosis present

## 2022-04-25 DIAGNOSIS — E871 Hypo-osmolality and hyponatremia: Secondary | ICD-10-CM | POA: Diagnosis present

## 2022-04-25 DIAGNOSIS — N184 Chronic kidney disease, stage 4 (severe): Secondary | ICD-10-CM | POA: Diagnosis not present

## 2022-04-25 DIAGNOSIS — Z79899 Other long term (current) drug therapy: Secondary | ICD-10-CM

## 2022-04-25 DIAGNOSIS — Z9481 Bone marrow transplant status: Secondary | ICD-10-CM | POA: Diagnosis not present

## 2022-04-25 DIAGNOSIS — Z79890 Hormone replacement therapy (postmenopausal): Secondary | ICD-10-CM

## 2022-04-25 DIAGNOSIS — Z90710 Acquired absence of both cervix and uterus: Secondary | ICD-10-CM

## 2022-04-25 DIAGNOSIS — J069 Acute upper respiratory infection, unspecified: Secondary | ICD-10-CM | POA: Diagnosis not present

## 2022-04-25 DIAGNOSIS — D631 Anemia in chronic kidney disease: Secondary | ICD-10-CM | POA: Diagnosis present

## 2022-04-25 DIAGNOSIS — U071 COVID-19: Principal | ICD-10-CM | POA: Diagnosis present

## 2022-04-25 DIAGNOSIS — Z803 Family history of malignant neoplasm of breast: Secondary | ICD-10-CM

## 2022-04-25 DIAGNOSIS — R911 Solitary pulmonary nodule: Secondary | ICD-10-CM | POA: Diagnosis present

## 2022-04-25 DIAGNOSIS — Z823 Family history of stroke: Secondary | ICD-10-CM | POA: Diagnosis not present

## 2022-04-25 DIAGNOSIS — K222 Esophageal obstruction: Secondary | ICD-10-CM | POA: Diagnosis not present

## 2022-04-25 DIAGNOSIS — F418 Other specified anxiety disorders: Secondary | ICD-10-CM | POA: Diagnosis not present

## 2022-04-25 DIAGNOSIS — J9601 Acute respiratory failure with hypoxia: Principal | ICD-10-CM

## 2022-04-25 DIAGNOSIS — C9000 Multiple myeloma not having achieved remission: Secondary | ICD-10-CM | POA: Diagnosis present

## 2022-04-25 DIAGNOSIS — E039 Hypothyroidism, unspecified: Secondary | ICD-10-CM | POA: Diagnosis present

## 2022-04-25 DIAGNOSIS — M069 Rheumatoid arthritis, unspecified: Secondary | ICD-10-CM | POA: Diagnosis present

## 2022-04-25 DIAGNOSIS — K5900 Constipation, unspecified: Secondary | ICD-10-CM | POA: Diagnosis present

## 2022-04-25 DIAGNOSIS — M109 Gout, unspecified: Secondary | ICD-10-CM | POA: Diagnosis present

## 2022-04-25 DIAGNOSIS — K2270 Barrett's esophagus without dysplasia: Secondary | ICD-10-CM | POA: Diagnosis present

## 2022-04-25 DIAGNOSIS — Z8249 Family history of ischemic heart disease and other diseases of the circulatory system: Secondary | ICD-10-CM | POA: Diagnosis not present

## 2022-04-25 LAB — LACTIC ACID, PLASMA
Lactic Acid, Venous: 1.2 mmol/L (ref 0.5–1.9)
Lactic Acid, Venous: 0.8 mmol/L (ref 0.5–1.9)

## 2022-04-25 LAB — COMPREHENSIVE METABOLIC PANEL
ALT: 15 U/L (ref 0–44)
AST: 38 U/L (ref 15–41)
Albumin: 3.6 g/dL (ref 3.5–5.0)
Alkaline Phosphatase: 56 U/L (ref 38–126)
Anion gap: 6 (ref 5–15)
BUN: 48 mg/dL — ABNORMAL HIGH (ref 8–23)
CO2: 26 mmol/L (ref 22–32)
Calcium: 8.9 mg/dL (ref 8.9–10.3)
Chloride: 100 mmol/L (ref 98–111)
Creatinine, Ser: 2.29 mg/dL — ABNORMAL HIGH (ref 0.44–1.00)
GFR, Estimated: 21 mL/min — ABNORMAL LOW (ref >60)
Glucose, Bld: 98 mg/dL (ref 70–99)
Potassium: 4.2 mmol/L (ref 3.5–5.1)
Sodium: 132 mmol/L — ABNORMAL LOW (ref 135–145)
Total Bilirubin: 0.6 mg/dL (ref 0.3–1.2)
Total Protein: 6.6 g/dL (ref 6.5–8.1)

## 2022-04-25 LAB — BLOOD GAS, VENOUS
Acid-Base Excess: 2 mmol/L (ref 0.0–2.0)
Bicarbonate: 27.3 mmol/L (ref 20.0–28.0)
O2 Saturation: 80.3 %
Patient temperature: 37
pCO2, Ven: 44 mmHg (ref 44–60)
pH, Ven: 7.4 (ref 7.25–7.43)
pO2, Ven: 54 mmHg — ABNORMAL HIGH (ref 32–45)

## 2022-04-25 LAB — CBC
HCT: 29.1 % — ABNORMAL LOW (ref 36.0–46.0)
Hemoglobin: 9.1 g/dL — ABNORMAL LOW (ref 12.0–15.0)
MCH: 32.3 pg (ref 26.0–34.0)
MCHC: 31.3 g/dL (ref 30.0–36.0)
MCV: 103.2 fL — ABNORMAL HIGH (ref 80.0–100.0)
Platelets: 164 10*3/uL (ref 150–400)
RBC: 2.82 MIL/uL — ABNORMAL LOW (ref 3.87–5.11)
RDW: 14.7 % (ref 11.5–15.5)
WBC: 5.5 10*3/uL (ref 4.0–10.5)
nRBC: 0 % (ref 0.0–0.2)

## 2022-04-25 LAB — TROPONIN I (HIGH SENSITIVITY)
Troponin I (High Sensitivity): 11 ng/L (ref <18)
Troponin I (High Sensitivity): 11 ng/L (ref <18)

## 2022-04-25 LAB — D-DIMER, QUANTITATIVE: D-Dimer, Quant: 3.43 ug/mL-FEU — ABNORMAL HIGH (ref 0.00–0.50)

## 2022-04-25 LAB — BRAIN NATRIURETIC PEPTIDE: B Natriuretic Peptide: 390.7 pg/mL — ABNORMAL HIGH (ref 0.0–100.0)

## 2022-04-25 MED ORDER — ALPRAZOLAM 0.25 MG PO TABS: 0.2500 mg | ORAL_TABLET | Freq: Every evening | ORAL | Status: DC | PRN

## 2022-04-25 MED ORDER — PHENOL 1.4 % MT LIQD: 1.0000 | OROMUCOSAL | Status: DC | PRN

## 2022-04-25 MED ORDER — PANTOPRAZOLE SODIUM 20 MG PO TBEC
20.0000 mg | DELAYED_RELEASE_TABLET | Freq: Every day | ORAL | Status: DC
  Administered 2022-04-25 – 2022-04-27 (×3): 20 mg via ORAL
  Filled 2022-04-25 (×3): qty 1

## 2022-04-25 MED ORDER — POLYETHYLENE GLYCOL 3350 17 G PO PACK: 17.0000 g | PACK | Freq: Every day | ORAL | Status: DC | PRN

## 2022-04-25 MED ORDER — METHYLPREDNISOLONE SODIUM SUCC 125 MG IJ SOLR
80.0000 mg | INTRAMUSCULAR | Status: DC
  Administered 2022-04-25: 80 mg via INTRAVENOUS
  Filled 2022-04-25: qty 2

## 2022-04-25 MED ORDER — ACYCLOVIR 400 MG PO TABS
400.0000 mg | ORAL_TABLET | Freq: Two times a day (BID) | ORAL | Status: DC
  Filled 2022-04-25: qty 1

## 2022-04-25 MED ORDER — ONDANSETRON HCL 4 MG/2ML IJ SOLN: 4.0000 mg | Freq: Three times a day (TID) | INTRAMUSCULAR | Status: DC | PRN

## 2022-04-25 MED ORDER — DM-GUAIFENESIN ER 30-600 MG PO TB12
1.0000 | ORAL_TABLET | Freq: Two times a day (BID) | ORAL | Status: DC | PRN
  Administered 2022-04-26: 1 via ORAL
  Filled 2022-04-25: qty 1

## 2022-04-25 MED ORDER — METHYLPREDNISOLONE SODIUM SUCC 40 MG IJ SOLR
40.0000 mg | Freq: Two times a day (BID) | INTRAMUSCULAR | Status: DC
  Administered 2022-04-26 – 2022-04-27 (×2): 40 mg via INTRAVENOUS
  Filled 2022-04-25 (×2): qty 1

## 2022-04-25 MED ORDER — ALBUTEROL SULFATE HFA 108 (90 BASE) MCG/ACT IN AERS: 2.0000 | INHALATION_SPRAY | RESPIRATORY_TRACT | Status: DC | PRN

## 2022-04-25 MED ORDER — IPRATROPIUM BROMIDE HFA 17 MCG/ACT IN AERS
2.0000 | INHALATION_SPRAY | RESPIRATORY_TRACT | Status: DC
  Administered 2022-04-25 – 2022-04-27 (×12): 2 via RESPIRATORY_TRACT
  Filled 2022-04-25: qty 12.9

## 2022-04-25 MED ORDER — METHYLPREDNISOLONE SODIUM SUCC 125 MG IJ SOLR: 125.0000 mg | Freq: Once | INTRAMUSCULAR | Status: DC

## 2022-04-25 MED ORDER — HEPARIN SODIUM (PORCINE) 5000 UNIT/ML IJ SOLN
5000.0000 [IU] | Freq: Three times a day (TID) | INTRAMUSCULAR | Status: DC
  Administered 2022-04-25 – 2022-04-27 (×6): 5000 [IU] via SUBCUTANEOUS
  Filled 2022-04-25 (×6): qty 1

## 2022-04-25 MED ORDER — NIRMATRELVIR/RITONAVIR (PAXLOVID) TABLET (RENAL DOSING)
2.0000 | ORAL_TABLET | Freq: Two times a day (BID) | ORAL | Status: DC
  Administered 2022-04-25 – 2022-04-27 (×4): 2 via ORAL
  Filled 2022-04-25: qty 20

## 2022-04-25 MED ORDER — LEVOTHYROXINE SODIUM 112 MCG PO TABS
112.0000 ug | ORAL_TABLET | Freq: Every day | ORAL | Status: DC
  Administered 2022-04-26 – 2022-04-27 (×2): 112 ug via ORAL
  Filled 2022-04-25 (×2): qty 1

## 2022-04-25 MED ORDER — ACETAMINOPHEN 325 MG PO TABS
650.0000 mg | ORAL_TABLET | Freq: Four times a day (QID) | ORAL | Status: DC | PRN
  Administered 2022-04-26: 650 mg via ORAL
  Filled 2022-04-25: qty 2

## 2022-04-25 MED ORDER — HYDROXYCHLOROQUINE SULFATE 200 MG PO TABS
200.0000 mg | ORAL_TABLET | Freq: Every day | ORAL | Status: DC
  Administered 2022-04-25 – 2022-04-27 (×3): 200 mg via ORAL
  Filled 2022-04-25 (×4): qty 1

## 2022-04-25 MED ORDER — ACYCLOVIR 200 MG PO CAPS
400.0000 mg | ORAL_CAPSULE | Freq: Two times a day (BID) | ORAL | Status: DC
  Administered 2022-04-25 – 2022-04-27 (×4): 400 mg via ORAL
  Filled 2022-04-25 (×5): qty 2

## 2022-04-25 MED ORDER — SODIUM CHLORIDE 0.9 % IV BOLUS
1000.0000 mL | Freq: Once | INTRAVENOUS | Status: AC
  Administered 2022-04-25: 1000 mL via INTRAVENOUS

## 2022-04-25 MED ORDER — ALLOPURINOL 100 MG PO TABS
300.0000 mg | ORAL_TABLET | Freq: Every day | ORAL | Status: DC
  Administered 2022-04-25 – 2022-04-27 (×3): 300 mg via ORAL
  Filled 2022-04-25 (×3): qty 3

## 2022-04-25 MED ORDER — TECHNETIUM TO 99M ALBUMIN AGGREGATED
4.3100 | Freq: Once | INTRAVENOUS | Status: AC | PRN
  Administered 2022-04-25: 4.31 via INTRAVENOUS

## 2022-04-25 NOTE — ED Triage Notes (Addendum)
Pt here with granddaughter c/o SOB d/t recent dx of + COVID 2 days ago. Daughter reports pulse ox at home dropped 82 % on RA, was advised to come to the ER for sats below 90. Pt denies CP, some SOB with ambulation, and weakness. Sats 91-92 % on RA at current. Daughter reports temp at home was 100.2, Tylenol given PTA. Pt currently being treated for multiple myeloma

## 2022-04-25 NOTE — Progress Notes (Signed)
PHARMACIST - PHYSICIAN COMMUNICATION   CONCERNING: Methylprednisolone IV    Current order: Methylprednisolone IV '40mg'$  IV q 12 hrs     DESCRIPTION: Per  Protocol:   IV methylprednisolone will be converted to either a q12h or q24h frequency with the same total daily dose (TDD).  Ordered Dose: 1 to 125 mg TDD; convert to: TDD q24h.  Ordered Dose: 126 to 250 mg TDD; convert to: TDD div q12h.  Ordered Dose: >250 mg TDD; DAW.  Order has been adjusted to: Methylprednisolone IV '80mg'$  q 24hrs  Kimbree Casanas Rodriguez-Guzman PharmD, BCPS 04/25/2022 12:20 PM

## 2022-04-25 NOTE — ED Provider Notes (Signed)
[facility] Provider Note   Event Date/Time   First MD Initiated Contact with Patient 04/25/22 0700     (approximate) History  Shortness of Breath  HPI Sydney Gillespie is a 81 y.o. female with stated past medical history of multiple myeloma currently on immunosuppressive therapy with the last dose last week Thursday who presents for 3 days of worsening fever, generalized bodyaches, and nonproductive cough.  Patient states that she was tested by her oncologist 2 days ago and tested positive for coronavirus.  Patient states that she has been having worsening shortness of breath since being diagnosed and was found at home on a home pulse ox to have oxygen saturation of 82%.  EMS found patient with sats below 90 and was placed on 2 L nasal cannula with improvement of her oxygenation. ROS: Patient currently denies any vision changes, tinnitus, difficulty speaking, facial droop, sore throat, chest pain, abdominal pain, nausea/vomiting/diarrhea, dysuria, or weakness/numbness/paresthesias in any extremity   Physical Exam  Triage Vital Signs: ED Triage Vitals [04/25/22 0639]  Enc Vitals Group     BP 113/67     Pulse Rate 75     Resp 16     Temp 100 F (37.8 C)     Temp Source Oral     SpO2 91 %     Weight 153 lb (69.4 kg)     Height _0  (1.651 m)     Head Circumference      Peak Flow      Pain Score 0     Pain Loc      Pain Edu?      Excl. in [place]?    Most recent vital signs: Vitals:   04/25/22 0834 04/25/22 0900  BP: (S) (!) 90/49 (!) 113/57  Pulse: 60   Resp: 13   Temp: 98 F (36.7 C)   SpO2: 100%    General: Awake, oriented x4. CV:  Good peripheral perfusion.  Resp:  Normal effort.  2 L nasal cannula in place Abd:  No distention.  Other:  Elderly Caucasian female laying in bed in no acute distress ED Results / Procedures / Treatments  Labs (all labs ordered are listed, but only abnormal results are displayed) Labs Reviewed  COMPREHENSIVE  METABOLIC PANEL - Abnormal; Notable for the following components:      Result Value   Sodium 132 (*)    BUN 48 (*)    Creatinine, Ser 2.29 (*)    GFR, Estimated 21 (*)    All other components within normal limits  CBC - Abnormal; Notable for the following components:   RBC 2.82 (*)    Hemoglobin 9.1 (*)    HCT 29.1 (*)    MCV 103.2 (*)    All other components within normal limits  D-DIMER, QUANTITATIVE - Abnormal; Notable for the following components:   D-Dimer, Quant 3.43 (*)    All other components within normal limits  BLOOD GAS, VENOUS - Abnormal; Notable for the following components:   pO2, Ven 54 (*)    All other components within normal limits  BRAIN NATRIURETIC PEPTIDE - Abnormal; Notable for the following components:   B Natriuretic Peptide 390.7 (*)    All other components within normal limits  CULTURE, BLOOD (ROUTINE X 2)  CULTURE, BLOOD (ROUTINE X 2)  LACTIC ACID, PLASMA  LACTIC ACID, PLASMA  C-REACTIVE PROTEIN  TROPONIN I (HIGH SENSITIVITY)   EKG ED ECG REPORT I, Naaman Plummer, the attending  physician, personally viewed and interpreted this ECG. Date: 04/25/2022 EKG Time: 0651 Rate: 74 Rhythm: normal sinus rhythm QRS Axis: normal Intervals: normal ST/T Wave abnormalities: normal Narrative Interpretation: no evidence of acute ischemia RADIOLOGY ED MD interpretation: One-view portable chest x-ray interpreted by me shows no evidence of acute abnormalities including no pneumonia, pneumothorax, or widened mediastinum -Agree with radiology assessment Official radiology report(s): CT Chest Wo Contrast  Result Date: 04/25/2022 CLINICAL DATA:  81 year old female with history of shortness of breath. Recent diagnosis of COVID 2 days ago. EXAM: CT CHEST WITHOUT CONTRAST TECHNIQUE: Multidetector CT imaging of the chest was performed following the standard protocol without IV contrast. RADIATION DOSE REDUCTION: This exam was performed according to the departmental  dose-optimization program which includes automated exposure control, adjustment of the mA and/or kV according to patient size and/or use of iterative reconstruction technique. COMPARISON:  No priors. FINDINGS: Cardiovascular: Heart size is normal. There is no significant pericardial fluid, thickening or pericardial calcification. There is aortic atherosclerosis, as well as atherosclerosis of the great vessels of the mediastinum and the coronary arteries, including calcified atherosclerotic plaque in the left main and left anterior descending coronary arteries. Mediastinum/Nodes: No pathologically enlarged mediastinal or hilar lymph nodes. Please note that accurate exclusion of hilar adenopathy is limited on noncontrast CT scans. Patulous fluid-filled esophagus. Small hiatal hernia. No axillary lymphadenopathy. Lungs/Pleura: Azygous lobe (normal anatomical variant) incidentally noted. Dependent areas of mild subsegmental atelectasis and/or scarring are noted in the lungs bilaterally. No acute consolidative airspace disease. No pleural effusions. 3 mm right upper lobe pulmonary nodule (axial image 64 of series 3). No other suspicious appearing pulmonary nodules or masses are noted. Small calcified granuloma in the right upper lobe. Upper Abdomen: Aortic atherosclerosis. Status post cholecystectomy. Calcified granulomas in the spleen incidentally noted. Musculoskeletal: Orthopedic fixation hardware in the lower cervical spine incidentally noted. Chronic appearing compression fractures of T7 superior endplate and T73 superior endplate, most severe at T12 where there is up to 25% loss of central vertebral body height. There are no aggressive appearing lytic or blastic lesions noted in the visualized portions of the skeleton. IMPRESSION: 1. No acute findings in the thorax. 2. Small 3 mm right upper lobe pulmonary nodule, nonspecific, but statistically likely benign. No follow-up needed if patient is low-risk.This  recommendation follows the consensus statement: Guidelines for Management of Incidental Pulmonary Nodules Detected on CT Images: From the Fleischner Society 2017; Radiology 2017; 284:228-243. 3. Aortic atherosclerosis, in addition to left main and left anterior descending coronary artery disease. 4. Fluid-filled patulous esophagus. 5. Small hiatal hernia. 6. Additional incidental findings, as above. Aortic Atherosclerosis (ICD10-I70.0). Electronically Signed   By: Vinnie Langton M.D.   On: 04/25/2022 09:04   DG Chest Port 1 View  Result Date: 04/25/2022 CLINICAL DATA:  SOB EXAM: PORTABLE CHEST 1 VIEW COMPARISON:  04/23/2022 FINDINGS: The heart size and mediastinal contours are within normal limits. Both lungs are clear. No pneumothorax or pleural effusion. Aorta is calcified the visualized skeletal structures are unremarkable. IMPRESSION: No active disease. Electronically Signed   By: Sammie Bench M.D.   On: 04/25/2022 07:13   PROCEDURES: Critical Care performed: Yes, see critical care procedure note(s) .1-3 Lead EKG Interpretation  Performed by: Naaman Plummer, MD Authorized by: Naaman Plummer, MD     Interpretation: normal     ECG rate:  66   ECG rate assessment: normal     Rhythm: sinus rhythm     Ectopy: none     Conduction:  normal   CRITICAL CARE Performed by: Naaman Plummer  Total critical care time: 33 minutes  Critical care time was exclusive of separately billable procedures and treating other patients.  Critical care was necessary to treat or prevent imminent or life-threatening deterioration.  Critical care was time spent personally by me on the following activities: development of treatment plan with patient and/or surrogate as well as nursing, discussions with consultants, evaluation of patient's response to treatment, examination of patient, obtaining history from patient or surrogate, ordering and performing treatments and interventions, ordering and review of  laboratory studies, ordering and review of radiographic studies, pulse oximetry and re-evaluation of patient's condition.  MEDICATIONS ORDERED IN ED: Medications  ipratropium (ATROVENT HFA) inhaler 2 puff (has no administration in time range)  albuterol (VENTOLIN HFA) 108 (90 Base) MCG/ACT inhaler 2 puff (has no administration in time range)  dextromethorphan-guaiFENesin (MUCINEX DM) 30-600 MG per 12 hr tablet 1 tablet (has no administration in time range)  ondansetron (ZOFRAN) injection 4 mg (has no administration in time range)  acetaminophen (TYLENOL) tablet 650 mg (has no administration in time range)  sodium chloride 0.9 % bolus 1,000 mL (1,000 mLs Intravenous New Bag/Given 04/25/22 0830)   IMPRESSION / MDM / ASSESSMENT AND PLAN / ED COURSE  I reviewed the triage vital signs and the nursing notes.                             The patient is on the cardiac monitor to evaluate for evidence of arrhythmia and/or significant heart rate changes. Patient's presentation is most consistent with acute presentation with potential threat to life or bodily function. Presentation most consistent with Viral Syndrome.  Patient has tested positive for COVID-19. At this time patient is requiring submental oxygenation due to acute hypoxic respiratory failure.  Given History and Exam I have a lower suspicion for: Emergent CardioPulmonary causes [such as Acute Asthma or COPD Exacerbation, acute Heart Failure or exacerbation, PE, PTX, atypical ACS, PNA]. Emergent Otolaryngeal causes [such as PTA, RPA, [place]igs, Epiglottitis, EBV].  Regarding Emergent Travel or Immunosuppressive related infectious: I have a low suspicion for acute HIV.  Given radiologic evidence for patchy bilateral airspace opacities concerning for viral pneumonia, continued need for supplemental oxygenation due to acute hypoxic respiratory failure, and need for further evaluation and management, patient will require admission   FINAL  CLINICAL IMPRESSION(S) / ED DIAGNOSES   Final diagnoses:  Acute respiratory failure with hypoxia ([place])  COVID-19 virus infection   Rx / DC Orders   ED Discharge Orders     None      Note:  This document was prepared using Dragon voice recognition software and may include unintentional dictation errors.   Naaman Plummer, MD 04/25/22 [phone]

## 2022-04-25 NOTE — H&P (Signed)
History and Physical    Sydney Gillespie PFX:[id number] DOB: 27-Oct-1940 DOA: 04/25/2022  Referring MD/NP/PA:   PCP: [name], MD   Patient coming from:  The patient is coming from home.  At baseline, pt is independent for most of ADL.        Chief Complaint: SOB  HPI: Sydney Gillespie is a 81 y.o. 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  Patient states that she has been sick for several days.  She has dry cough, shortness breath, fever, chills, sore throat, malaise. Pt had positive Covid19 test in clinic on 12/14 , and was started on Paxlovid without improvement.  Her shortness breath has been progressively worsening.  Patient is normally not using oxygen, but was found to have oxygen desaturation to 82% on room air, which improved to 95% on 2 L oxygen.  Patient is constipated, has nausea, but no vomiting or abdominal pain.  No symptoms of UTI.   Data reviewed independently and ED Course: pt was found to have WBC 5.5, lactic acid 1.2, 0.8, troponin level 11, BNP 390, positive D-dimer 3.43, renal function close to baseline, temperature normal, blood pressure 90/49, heart rate 64, RR 22. Chest x-ray and CT of chest without contrast negative for PAN. negative V/Q for PE. Negative LE venous Doppler for DVT.  Pt is admitted to telemetry bed as inpatient.  EKG: I have personally reviewed.  Sinus rhythm, QTc 461, low voltage, LAD, poor R wave progression.   Review of Systems:   General: no fevers, chills, no body weight gain, has fatigue HEENT: no blurry vision, hearing changes. Has sore throat Respiratory: has dyspnea, coughing, no wheezing CV: no chest pain, no palpitations GI: has nausea, no vomiting, abdominal pain, diarrhea, has constipation GU: no dysuria,  burning on urination, increased urinary frequency, hematuria  Ext: no leg edema Neuro: no unilateral weakness, numbness, or tingling, no vision change or hearing loss Skin: no rash, no skin tear. MSK: No muscle spasm, no deformity, no limitation of range of movement in spin Heme: No easy bruising.  Travel history: No recent long distant travel.   Allergy:  Allergies  Allergen Reactions   Isoniazid Other (See Comments)    Blisters     Past Medical History:  Diagnosis Date   Allergic rhinitis    Allergy    Anemia    Anemia    Barrett's esophagus    Blood dyscrasia    multiple myloma remission   Cancer ([place])    Change in bowel habits    Compression fracture 2013   Degenerative disc disease, lumbar    Dysrhythmia    palpitations   Esophageal reflux    Family history of adverse reaction to anesthesia    nausea -mom   GERD (gastroesophageal reflux disease)    Gout    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[place])    Heart palpitations    Hyperlipidemia    Hypothyroidism    Inflammatory polyarthropathy (HCC)    Lumbago    Lumbar radiculitis    Lumbar stenosis with neurogenic claudication    Multiple myeloma ([place])    Multiple myeloma (HCC)    Osteopenia    Osteoporosis    Other dysphagia    Palpitations    Positive PPD    Renal insufficiency    Rheumatoid arthritis (HCC)    Rheumatoid  arthritis ([place])    Seasonal allergies    Vertigo     Past Surgical History:  Procedure Laterality Date   ABDOMINAL HYSTERECTOMY     APPENDECTOMY     BACK SURGERY     BREAST EXCISIONAL BIOPSY Bilateral    neg   CHOLECYSTECTOMY     COLONOSCOPY WITH PROPOFOL N/A 11/12/2016   Procedure: COLONOSCOPY WITH PROPOFOL;  Surgeon: Manya Silvas, MD;  Location: [facility] ENDOSCOPY;  Service: Endoscopy;  Laterality: N/A;   ESOPHAGOGASTRODUODENOSCOPY (EGD) WITH PROPOFOL N/A 11/12/2016   Procedure: ESOPHAGOGASTRODUODENOSCOPY (EGD) WITH PROPOFOL;  Surgeon: Manya Silvas, MD;  Location: [facility] ENDOSCOPY;   Service: Endoscopy;  Laterality: N/A;   FOOT SURGERY     LUMBAR LAMINECTOMY/DECOMPRESSION MICRODISCECTOMY Left 06/18/2016   Procedure: LAMINECTOMY FOR FACET/SYNOVIAL CYST LUMBAR THREE - LUMBAR FOUR LEFT;  Surgeon: Newman Pies, MD;  Location: [facility];  Service: Neurosurgery;  Laterality: Left;  LAMINECTOMY FOR FACET/SYNOVIAL CYST LUMBAR THREE - LUMBAR FOUR LEFT    Social History:  reports that she has never smoked. She has never used smokeless tobacco. She reports that she does not drink alcohol and does not use drugs.  Family History:  Family History  Problem Relation Age of Onset   Hypertension Mother    Heart attack Mother    Rheum arthritis Mother    Osteoarthritis Mother    Cancer Father    Hypertension Father    Stroke Father    Breast cancer Maternal Grandmother 32   Osteoarthritis Other    Rheum arthritis Other      Prior to Admission medications   Medication Sig Start Date End Date Taking? Authorizing Provider  acetaminophen (TYLENOL) 325 MG tablet Take 650 mg by mouth every 6 (six) hours as needed for fever or headache.    [provider]  acyclovir (ZOVIRAX) 400 MG tablet Take 1 tablet (400 mg total) by mouth 2 (two) times daily. 04/10/22   Cammie Sickle, MD  allopurinol (ZYLOPRIM) 300 MG tablet Take 300 mg by mouth daily.  09/14/14   [provider]  ALPRAZolam (XANAX) 0.25 MG tablet TAKE 1 TABLET(0.25 MG) BY MOUTH AT BEDTIME AS NEEDED FOR ANXIETY 04/24/22   Cammie Sickle, MD  Ascorbic Acid (VITAMIN C) 1000 MG tablet Take 1,000 mg by mouth daily.    [provider]  atenolol (TENORMIN) 50 MG tablet Take 0.5 tablets (25 mg total) by mouth daily. 09/05/18   Ghimire, Henreitta Leber, MD  benzonatate (TESSALON) 200 MG capsule Take 1 capsule (200 mg total) by mouth 3 (three) times daily as needed for cough. 04/24/22   Verlon Au, NP  CALCIUM MAGNESIUM ZINC PO Take 1 tablet by mouth in the morning, at noon, and at bedtime.    [provider]  cetirizine (ZYRTEC) 10 MG tablet Take 10 mg by mouth 2 (two) times daily.     [provider]  chlorpheniramine-HYDROcodone (TUSSIONEX) 10-8 MG/5ML Take 5 mLs by mouth every 12 (twelve) hours as needed (for cough unrelieved by other medications). May cause drowsiness. 04/24/22   Verlon Au, NP  denosumab (PROLIA) 60 MG/ML SOSY injection Inject into the skin. Patient not taking: Reported on 05/07/2021    [provider]  docusate sodium (COLACE) 100 MG capsule Take 100 mg by mouth 2 (two) times daily.    [provider]  DULoxetine (CYMBALTA) 20 MG capsule  10/02/20   [provider]  ferrous sulfate 324 MG TBEC Take 324 mg by mouth. Patient  not taking: Reported on 04/23/2022    [provider]  fluticasone (FLONASE) 50 MCG/ACT nasal spray Place 2 sprays into the nose daily.     [provider]  hydroxychloroquine (PLAQUENIL) 200 MG tablet daily. 10/25/18   [provider]  lansoprazole (PREVACID) 30 MG capsule Take 30 mg by mouth daily at 12 noon.    [provider]  leflunomide (ARAVA) 10 MG tablet Take 1 tablet by mouth daily. 01/04/20   [provider]  lenalidomide (REVLIMID) 10 MG capsule Take 1 capsule (10 mg total) by mouth daily. Take for 21 days, then hold for 7 days. Repeat every 28 days. Patient not taking: Reported on 04/23/2022 04/16/22   Cammie Sickle, MD  levothyroxine (SYNTHROID) 112 MCG tablet  01/16/22   [provider]  MYRBETRIQ 50 MG TB24 tablet Take 50 mg by mouth daily.    [provider]  nirmatrelvir/ritonavir EUA, renal dosing, (PAXLOVID) 10 x 150 MG & 10 x 100MG TABS Take 2 tablets by mouth 2 (two) times daily for 5 days. Take nirmatrelvir (150 mg) one tablet twice daily for 5 days and ritonavir (100 mg) one tablet twice daily for 5 days. 04/23/22 04/28/22  Verlon Au, NP  ondansetron (ZOFRAN) 8 MG tablet TAKE 1 TABLET BY MOUTH EVERY 8 HOURS AS  NEEDED FOR NAUSEA OR VOMITING 10/27/21   Cammie Sickle, MD  oxybutynin (DITROPAN-XL) 10 MG 24 hr tablet Take 1 tablet (10 mg total) by mouth daily. 10/21/20   Bjorn Loser, MD  Potassium 99 MG TABS Take 1 tablet by mouth daily.    [provider]  rOPINIRole (REQUIP) 0.5 MG tablet Take by mouth. 10/02/20   [provider]  triamterene-hydrochlorothiazide (MAXZIDE-25) 37.5-25 MG tablet TK 1 T PO PRN Patient not taking: Reported on 04/16/2022 09/18/18   [provider]  vitamin B-12 (CYANOCOBALAMIN) 1000 MCG tablet Take 1,000 mcg by mouth daily. 01/03/20   [provider]    Physical Exam: Vitals:   04/25/22 1400 04/25/22 1430 04/25/22 1514 04/25/22 1608  BP: (!) 107/52 (!) 107/54 (!) 109/56 (!) 121/53  Pulse: (!) 59 (!) 59 62 64  Resp:   20 16  Temp:   98 F (36.7 C) 98.3 F (36.8 C)  TempSrc:   Oral Oral  SpO2: 100% 99% 99% 97%  Weight:      Height:       General: Not in acute distress HEENT:       Eyes: PERRL, EOMI, no scleral icterus.       ENT: No discharge from the ears and nose, no pharynx injection, no tonsillar enlargement.        Neck: No JVD, no bruit, no mass felt. Heme: No neck lymph node enlargement. Cardiac: S1/S2, RRR, No murmurs, No gallops or rubs. Respiratory: No rales, wheezing, rhonchi or rubs. GI: Soft, nondistended, nontender, no rebound pain, no organomegaly, BS present. GU: No hematuria Ext: No pitting leg edema bilaterally. 1+DP/PT pulse bilaterally. Musculoskeletal: No joint deformities, No joint redness or warmth, no limitation of ROM in spin. Skin: No rashes.  Neuro: Alert, oriented X3, cranial nerves II-XII grossly intact, moves all extremities normally. Psych: Patient is not psychotic, no suicidal or hemocidal ideation.  Labs on Admission: I have personally reviewed following labs and imaging studies  CBC: Recent Labs  Lab 04/23/22 0825 04/25/22 0646  WBC 9.5 5.5  NEUTROABS 8.2*  --   HGB 9.6* 9.1*   HCT 29.2* 29.1*  MCV 99.7  103.2*  PLT 151 329   Basic Metabolic Panel: Recent Labs  Lab 04/23/22 0825 04/25/22 0646  NA 132* 132*  K 4.1 4.2  CL 97* 100  CO2 23 26  GLUCOSE 104* 98  BUN 41* 48*  CREATININE 2.26* 2.29*  CALCIUM 9.2 8.9   GFR: Estimated Creatinine Clearance: 18.9 mL/min (A) (by C-G formula based on SCr of 2.29 mg/dL (H)). Liver Function Tests: Recent Labs  Lab 04/23/22 0825 04/25/22 0646  AST 28 38  ALT 9 15  ALKPHOS 48 56  BILITOT 0.6 0.6  PROT 6.6 6.6  ALBUMIN 3.6 3.6   No results for input(s): "LIPASE", "AMYLASE" in the last 168 hours. No results for input(s): "AMMONIA" in the last 168 hours. Coagulation Profile: No results for input(s): "INR", "PROTIME" in the last 168 hours. Cardiac Enzymes: No results for input(s): "CKTOTAL", "CKMB", "CKMBINDEX", "TROPONINI" in the last 168 hours. BNP (last 3 results) No results for input(s): "PROBNP" in the last 8760 hours. HbA1C: No results for input(s): "HGBA1C" in the last 72 hours. CBG: No results for input(s): "GLUCAP" in the last 168 hours. Lipid Profile: No results for input(s): "CHOL", "HDL", "LDLCALC", "TRIG", "CHOLHDL", "LDLDIRECT" in the last 72 hours. Thyroid Function Tests: No results for input(s): "TSH", "T4TOTAL", "FREET4", "T3FREE", "THYROIDAB" in the last 72 hours. Anemia Panel: No results for input(s): "VITAMINB12", "FOLATE", "FERRITIN", "TIBC", "IRON", "RETICCTPCT" in the last 72 hours. Urine analysis:    Component Value Date/Time   APPEARANCEUR Clear 06/24/2020 1113   GLUCOSEU Negative 06/24/2020 1113   BILIRUBINUR Negative 06/24/2020 1113   PROTEINUR Negative 06/24/2020 1113   NITRITE Negative 06/24/2020 1113   LEUKOCYTESUR Trace (A) 06/24/2020 1113   Sepsis Labs: _0 (procalcitonin:4,lacticidven:4) ) Recent Results (from the past 240 hour(s))  Resp panel by RT-PCR (RSV, Flu A&B, Covid) Anterior Nasal Swab     Status: Abnormal   Collection Time: 04/23/22  9:50 AM    Specimen: Anterior Nasal Swab  Result Value Ref Range Status   SARS Coronavirus 2 by RT PCR POSITIVE (A) NEGATIVE Final    Comment: (NOTE) SARS-CoV-2 target nucleic acids are DETECTED.  The SARS-CoV-2 RNA is generally detectable in upper respiratory specimens during the acute phase of infection. Positive results are indicative of the presence of the identified virus, but do not rule out bacterial infection or co-infection with other pathogens not detected by the test. Clinical correlation with patient history and other diagnostic information is necessary to determine patient infection status. The expected result is Negative.  Fact Sheet for Patients: EntrepreneurPulse.com.au  Fact Sheet for Healthcare Providers: IncredibleEmployment.be  This test is not yet approved or cleared by the [place] FDA and  has been authorized for detection and/or diagnosis of SARS-CoV-2 by FDA under an Emergency Use Authorization (EUA).  This EUA will remain in effect (meaning this test can be used) for the duration of  the COVID-19 declaration under Section 564(b)(1) of the A ct, 21 U.S.C. section 360bbb-3(b)(1), unless the authorization is terminated or revoked sooner.     Influenza A by PCR NEGATIVE NEGATIVE Final   Influenza B by PCR NEGATIVE NEGATIVE Final    Comment: (NOTE) The Xpert Xpress SARS-CoV-2/FLU/RSV plus assay is intended as an aid in the diagnosis of influenza from Nasopharyngeal swab specimens and should not be used as a sole basis for treatment. Nasal washings and aspirates are unacceptable for Xpert Xpress SARS-CoV-2/FLU/RSV testing.  Fact Sheet for Patients: EntrepreneurPulse.com.au  Fact Sheet for Healthcare Providers: IncredibleEmployment.be  This test is not yet  approved or cleared by the [place] and has been authorized for detection and/or diagnosis of SARS-CoV-2 by FDA under an  Emergency Use Authorization (EUA). This EUA will remain in effect (meaning this test can be used) for the duration of the COVID-19 declaration under Section 564(b)(1) of the Act, 21 U.S.C. section 360bbb-3(b)(1), unless the authorization is terminated or revoked.     Resp Syncytial Virus by PCR NEGATIVE NEGATIVE Final    Comment: (NOTE) Fact Sheet for Patients: EntrepreneurPulse.com.au  Fact Sheet for Healthcare Providers: IncredibleEmployment.be  This test is not yet approved or cleared by the [place] FDA and has been authorized for detection and/or diagnosis of SARS-CoV-2 by FDA under an Emergency Use Authorization (EUA). This EUA will remain in effect (meaning this test can be used) for the duration of the COVID-19 declaration under Section 564(b)(1) of the Act, 21 U.S.C. section 360bbb-3(b)(1), unless the authorization is terminated or revoked.  Performed at [facility], [address] [place], [place] [address]   Blood culture (routine x 2)     Status: None (Preliminary result)   Collection Time: 04/25/22  6:46 AM   Specimen: BLOOD RIGHT FOREARM  Result Value Ref Range Status   Specimen Description BLOOD RIGHT FOREARM  Final   Special Requests   Final    BOTTLES DRAWN AEROBIC AND ANAEROBIC Blood Culture adequate volume   Culture   Final    NO GROWTH < 12 HOURS Performed at [facility], [address] [place], [place] [address]    Report Status PENDING  Incomplete  Blood culture (routine x 2)     Status: None (Preliminary result)   Collection Time: 04/25/22  6:46 AM   Specimen: Right Antecubital; Blood  Result Value Ref Range Status   Specimen Description RIGHT ANTECUBITAL  Final   Special Requests   Final    BOTTLES DRAWN AEROBIC AND ANAEROBIC Blood Culture adequate volume   Culture   Final    NO GROWTH < 12 HOURS Performed at [facility], [address], [place], [place] [address]     Report Status PENDING  Incomplete     Radiological Exams on Admission: US Venous Img Lower Bilateral (DVT)  Result Date: 04/25/2022 CLINICAL DATA:  Positive D-dimer EXAM: BILATERAL LOWER EXTREMITY VENOUS DOPPLER ULTRASOUND TECHNIQUE: Gray-scale sonography with compression, as well as color and duplex ultrasound, were performed to evaluate the deep venous system(s) from the level of the common femoral vein through the popliteal and proximal calf veins. COMPARISON:  None Available. FINDINGS: VENOUS Normal compressibility of the common femoral, superficial femoral, and popliteal veins, as well as the visualized calf veins. Visualized portions of profunda femoral vein and great saphenous vein unremarkable. No filling defects to suggest DVT on grayscale or color Doppler imaging. Doppler waveforms show normal direction of venous flow, normal respiratory plasticity and response to augmentation. Limited views of the contralateral common femoral vein are unremarkable. OTHER None. Limitations: none IMPRESSION: Negative. Electronically Signed   By: Dorise Bullion III M.D.   On: 04/25/2022 12:31   NM Pulmonary Perfusion  Result Date: 04/25/2022 CLINICAL DATA:  PE suspected. Patient is COVID-19 positive. Shortness of breath. EXAM: NUCLEAR MEDICINE PERFUSION LUNG SCAN TECHNIQUE: Perfusion images were obtained in multiple projections after intravenous injection of radiopharmaceutical. Ventilation scans intentionally deferred if perfusion scan and chest x-ray adequate for interpretation during COVID 19 epidemic. RADIOPHARMACEUTICALS:  4.31 mCi Tc-17mMAA IV COMPARISON:  CT of the chest April 25, 2022 FINDINGS: No segmental  perfusion defects identified. IMPRESSION: No segmental perfusion defects identified. No evidence of pulmonary embolus on this study. Electronically Signed   By: Dorise Bullion III M.D.   On: 04/25/2022 11:50   CT Chest Wo Contrast  Result Date: 04/25/2022 CLINICAL DATA:  81 year old female  with history of shortness of breath. Recent diagnosis of COVID 2 days ago. EXAM: CT CHEST WITHOUT CONTRAST TECHNIQUE: Multidetector CT imaging of the chest was performed following the standard protocol without IV contrast. RADIATION DOSE REDUCTION: This exam was performed according to the departmental dose-optimization program which includes automated exposure control, adjustment of the mA and/or kV according to patient size and/or use of iterative reconstruction technique. COMPARISON:  No priors. FINDINGS: Cardiovascular: Heart size is normal. There is no significant pericardial fluid, thickening or pericardial calcification. There is aortic atherosclerosis, as well as atherosclerosis of the great vessels of the mediastinum and the coronary arteries, including calcified atherosclerotic plaque in the left main and left anterior descending coronary arteries. Mediastinum/Nodes: No pathologically enlarged mediastinal or hilar lymph nodes. Please note that accurate exclusion of hilar adenopathy is limited on noncontrast CT scans. Patulous fluid-filled esophagus. Small hiatal hernia. No axillary lymphadenopathy. Lungs/Pleura: Azygous lobe (normal anatomical variant) incidentally noted. Dependent areas of mild subsegmental atelectasis and/or scarring are noted in the lungs bilaterally. No acute consolidative airspace disease. No pleural effusions. 3 mm right upper lobe pulmonary nodule (axial image 64 of series 3). No other suspicious appearing pulmonary nodules or masses are noted. Small calcified granuloma in the right upper lobe. Upper Abdomen: Aortic atherosclerosis. Status post cholecystectomy. Calcified granulomas in the spleen incidentally noted. Musculoskeletal: Orthopedic fixation hardware in the lower cervical spine incidentally noted. Chronic appearing compression fractures of T7 superior endplate and J62 superior endplate, most severe at T12 where there is up to 25% loss of central vertebral body height. There  are no aggressive appearing lytic or blastic lesions noted in the visualized portions of the skeleton. IMPRESSION: 1. No acute findings in the thorax. 2. Small 3 mm right upper lobe pulmonary nodule, nonspecific, but statistically likely benign. No follow-up needed if patient is low-risk.This recommendation follows the consensus statement: Guidelines for Management of Incidental Pulmonary Nodules Detected on CT Images: From the Fleischner Society 2017; Radiology 2017; 284:228-243. 3. Aortic atherosclerosis, in addition to left main and left anterior descending coronary artery disease. 4. Fluid-filled patulous esophagus. 5. Small hiatal hernia. 6. Additional incidental findings, as above. Aortic Atherosclerosis (ICD10-I70.0). Electronically Signed   By: Vinnie Langton M.D.   On: 04/25/2022 09:04   DG Chest Port 1 View  Result Date: 04/25/2022 CLINICAL DATA:  SOB EXAM: PORTABLE CHEST 1 VIEW COMPARISON:  04/23/2022 FINDINGS: The heart size and mediastinal contours are within normal limits. Both lungs are clear. No pneumothorax or pleural effusion. Aorta is calcified the visualized skeletal structures are unremarkable. IMPRESSION: No active disease. Electronically Signed   By: Sammie Bench M.D.   On: 04/25/2022 07:13      Assessment/Plan Principal Problem:   Acute respiratory disease due to COVID-19 virus Active Problems:   Multiple myeloma (HCC)   CKD (chronic kidney disease), stage IV (HCC)   Gout   Rheumatoid arthritis (HCC)   Hypothyroidism   Iron deficiency anemia   Depression with anxiety   Assessment and Plan:  Acute respiratory disease due to COVID-19 virus: Patient has 2 L new oxygen requirement.  -Admit to telemetry bed as inpatient -Started Paxlovid -Bronchodilators and as needed Mucinex -Check CRP level -Solu-Medrol 40 mg twice daily  Multiple  myeloma [facility]): pt is currently getting daratumumab treatment -f/u with Dr. Rogue Bussing  -I discussed with Dr. Janese Banks about  Solu-Medrol use.  Per Dr. Janese Banks, it is okay to use Solu-Medrol  CKD (chronic kidney disease), stage IV [facility]): Close to baseline.  Recent baseline creatinine 2.0-2.5.  Her creatinine is at 2.29, BUN 48, GFR 21. -f/u with BMP  Gout -Continue home allopurinol  Rheumatoid arthritis (HCC) -Plaquenil  Hypothyroidism -Synthroid  Iron deficiency anemia: Hemoglobin stable 9.1 (baseline hemoglobin 9-10) -f/u by CBC  Depression with anxiety -Continue home medications      DVT ppx: SQ Lovenox  Code Status: Full code per pt and her granddaughter  Family Communication:    Yes, patient's granddaughter at bed side.       Disposition Plan:  Anticipate discharge back to previous environment  Consults called: Discussed with Dr. Janese Banks of oncology  Admission status and Level of care: Telemetry Medical:   as inpt       Dispo: The patient is from: Home              Anticipated d/c is to: Home              Anticipated d/c date is: 2 days              Patient currently is not medically stable to d/c.    Severity of Illness:  The appropriate patient status for this patient is INPATIENT. Inpatient status is judged to be reasonable and necessary in order to provide the required intensity of service to ensure the patient's safety. The patient's presenting symptoms, physical exam findings, and initial radiographic and laboratory data in the context of their chronic comorbidities is felt to place them at high risk for further clinical deterioration. Furthermore, it is not anticipated that the patient will be medically stable for discharge from the hospital within 2 midnights of admission.   * I certify that at the point of admission it is my clinical judgment that the patient will require inpatient hospital care spanning beyond 2 midnights from the point of admission due to high intensity of service, high risk for further deterioration and high frequency of surveillance required.*       Date of  Service 04/25/2022    Ivor Costa [facility]   If 7PM-7AM, please contact night-coverage [email or web address] 04/25/2022, 6:31 PM

## 2022-04-25 NOTE — ED Notes (Signed)
Dr. Cheri Fowler made aware of patient BP 96/52

## 2022-04-26 ENCOUNTER — Other Ambulatory Visit: Payer: Self-pay

## 2022-04-26 DIAGNOSIS — K2270 Barrett's esophagus without dysplasia: Secondary | ICD-10-CM

## 2022-04-26 DIAGNOSIS — K222 Esophageal obstruction: Secondary | ICD-10-CM

## 2022-04-26 LAB — PROCALCITONIN: Procalcitonin: 0.39 ng/mL

## 2022-04-26 LAB — CBC
HCT: 28.6 % — ABNORMAL LOW (ref 36.0–46.0)
Hemoglobin: 9 g/dL — ABNORMAL LOW (ref 12.0–15.0)
MCH: 32.4 pg (ref 26.0–34.0)
MCHC: 31.5 g/dL (ref 30.0–36.0)
MCV: 102.9 fL — ABNORMAL HIGH (ref 80.0–100.0)
Platelets: 158 10*3/uL (ref 150–400)
RBC: 2.78 MIL/uL — ABNORMAL LOW (ref 3.87–5.11)
RDW: 14.2 % (ref 11.5–15.5)
WBC: 5.6 10*3/uL (ref 4.0–10.5)
nRBC: 0 % (ref 0.0–0.2)

## 2022-04-26 LAB — BASIC METABOLIC PANEL
Anion gap: 9 (ref 5–15)
BUN: 54 mg/dL — ABNORMAL HIGH (ref 8–23)
CO2: 23 mmol/L (ref 22–32)
Calcium: 9.2 mg/dL (ref 8.9–10.3)
Chloride: 100 mmol/L (ref 98–111)
Creatinine, Ser: 2.17 mg/dL — ABNORMAL HIGH (ref 0.44–1.00)
GFR, Estimated: 22 mL/min — ABNORMAL LOW (ref >60)
Glucose, Bld: 127 mg/dL — ABNORMAL HIGH (ref 70–99)
Potassium: 4.7 mmol/L (ref 3.5–5.1)
Sodium: 132 mmol/L — ABNORMAL LOW (ref 135–145)

## 2022-04-26 LAB — C-REACTIVE PROTEIN: CRP: 14.5 mg/dL — ABNORMAL HIGH (ref <1.0)

## 2022-04-26 MED ORDER — HYDROCOD POLI-CHLORPHE POLI ER 10-8 MG/5ML PO SUER
2.5000 mL | Freq: Two times a day (BID) | ORAL | Status: DC
  Administered 2022-04-26 – 2022-04-27 (×3): 2.5 mL via ORAL
  Filled 2022-04-26 (×4): qty 5

## 2022-04-26 MED ORDER — ATENOLOL 50 MG PO TABS
50.0000 mg | ORAL_TABLET | Freq: Every day | ORAL | Status: DC
  Administered 2022-04-26 – 2022-04-27 (×2): 50 mg via ORAL
  Filled 2022-04-26 (×2): qty 1

## 2022-04-26 NOTE — Progress Notes (Signed)
  Progress Note   Patient: Sydney Gillespie VEL:[id number] DOB: Jul 11, 1940 DOA: 04/25/2022     1 DOS: the patient was seen and examined on 04/26/2022   Brief hospital course: HONEST SAFRANEK is a 81 y.o. 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  She was tested positive for COVID 3 days ago, was started on Paxlovid on 12/14.  Upon arriving the hospital, she was hypoxic with 82% oxygen saturation on room air.  She was placed on 2 L oxygen.   She is treated with IV steroids in addition to Paxlovid.  Assessment and Plan: Acute respiratory failure secondary to COVID infection. COVID infection. Reviewed patient CT scan and x-ray results, independently reviewed the images.  Patient does not have any infiltrates/consolidation to indicate pneumonia. Will continue IV Solu-Medrol and rituximab daily.  Patient oxygenation already improving, currently on 1 L oxygen.  Lung nodule. Follow-up with PCP as outpatient.  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 Patient is followed by oncology as outpatient.  Dysphagia. Esophageal stenosis with Barrett esophagus. Patient has been complaining of intermittent dysphagia, she had a history of esophageal stenosis, last EGD was performed 5 years ago, she is due for another esophageal dilation. CT scan showed fluid-filled esophagus, however, patient does not have any aspiration pneumonia.  Will obtain speech therapy evaluation to provide guidance to avoid aspiration.  Patient be followed with GI as outpatient.  Chronic kidney disease stage IV. Renal function still stable.  Continue to follow  Rheumatoid arthritis On Plaquenil.  Iron deficient anemia. Continue to follow, transfuse as needed.  No active bleeding.      Subjective:   Patient feels better this morning, currently on bilateral oxygen.  She has intermittent dysphagia, no choking.  Short of breath is better.  Physical Exam: Vitals:   04/25/22 2040 04/26/22 0459 04/26/22 0500 04/26/22 0828  BP: 127/62 (!) 145/66  (!) 127/96  Pulse: 64 70  71  Resp: _0 Temp: 97.7 F (36.5 C) 97.9 F (36.6 C)  98.2 F (36.8 C)  TempSrc: Oral Oral  Oral  SpO2: 95% 95%  97%  Weight:   69.4 kg   Height:       General exam: Appears calm and comfortable  Respiratory system: Clear to auscultation. Respiratory effort normal. Cardiovascular system: S1 & S2 heard, RRR. No JVD, murmurs, rubs, gallops or clicks. No pedal edema. Gastrointestinal system: Abdomen is nondistended, soft and nontender. No organomegaly or masses felt. Normal bowel sounds heard. Central nervous system: Alert and oriented. No focal neurological deficits. Extremities: Symmetric 5 x 5 power. Skin: No rashes, lesions or ulcers Psychiatry: Judgement and insight appear normal. Mood & affect appropriate.   Data Reviewed:  Reviewed chest x-ray, CT scan images, results, lab results.  Family Communication:   Disposition: Status is: Inpatient Remains inpatient appropriate because: Severity of disease, IV treatment.  Planned Discharge Destination: Home with Home Health    Time spent: 55 minutes  Author: Sharen Hones, MD 04/26/2022 10:16 AM  For on call review [email or web address].

## 2022-04-26 NOTE — Hospital Course (Signed)
Sydney Gillespie is a 81 y.o. 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  She was tested positive for COVID 3 days ago, was started on Paxlovid on 12/14.  Upon arriving the hospital, she was hypoxic with 82% oxygen saturation on room air.  She was placed on 2 L oxygen.   She is treated with IV steroids in addition to Paxlovid.

## 2022-04-26 NOTE — Evaluation (Signed)
Clinical/Bedside Swallow Evaluation Patient Details  Name: Sydney Gillespie MRN: [id number] Date of Birth: Mar 07, 1941  Today's Date: 04/26/2022 Time: SLP Start Time (ACUTE ONLY): 29 SLP Stop Time (ACUTE ONLY): 2500 SLP Time Calculation (min) (ACUTE ONLY): 15 min  Past Medical History:  Past Medical History:  Diagnosis Date   Allergic rhinitis    Allergy    Anemia    Anemia    Barrett's esophagus    Blood dyscrasia    multiple myloma remission   Cancer ([place])    Change in bowel habits    Compression fracture 2013   Degenerative disc disease, lumbar    Dysrhythmia    palpitations   Esophageal reflux    Family history of adverse reaction to anesthesia    nausea -mom   GERD (gastroesophageal reflux disease)    Gout    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[place])    Heart palpitations    Hyperlipidemia    Hypothyroidism    Inflammatory polyarthropathy (HCC)    Lumbago    Lumbar radiculitis    Lumbar stenosis with neurogenic claudication    Multiple myeloma ([place])    Multiple myeloma ([place])    Osteopenia    Osteoporosis    Other dysphagia    Palpitations    Positive PPD    Renal insufficiency    Rheumatoid arthritis (HCC)    Rheumatoid arthritis (HCC)    Seasonal allergies    Vertigo    Past Surgical History:  Past Surgical History:  Procedure Laterality Date   ABDOMINAL HYSTERECTOMY     APPENDECTOMY     BACK SURGERY     BREAST EXCISIONAL BIOPSY Bilateral    neg   CHOLECYSTECTOMY     COLONOSCOPY WITH PROPOFOL N/A 11/12/2016   Procedure: COLONOSCOPY WITH PROPOFOL;  Surgeon: Manya Silvas, MD;  Location: [facility] ENDOSCOPY;  Service: Endoscopy;  Laterality: N/A;   ESOPHAGOGASTRODUODENOSCOPY (EGD) WITH PROPOFOL N/A 11/12/2016   Procedure: ESOPHAGOGASTRODUODENOSCOPY (EGD) WITH PROPOFOL;  Surgeon: Manya Silvas, MD;  Location: [facility] ENDOSCOPY;  Service: Endoscopy;  Laterality: N/A;   FOOT SURGERY     LUMBAR LAMINECTOMY/DECOMPRESSION MICRODISCECTOMY Left 06/18/2016   Procedure:  LAMINECTOMY FOR FACET/SYNOVIAL CYST LUMBAR THREE - LUMBAR FOUR LEFT;  Surgeon: Newman Pies, MD;  Location: [facility];  Service: Neurosurgery;  Laterality: Left;  LAMINECTOMY FOR FACET/SYNOVIAL CYST LUMBAR THREE - LUMBAR FOUR LEFT   HPI:  Per H&P "REBECAH Gillespie is a 81 y.o. 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     Patient states that she has been sick for several days.  She has dry cough, shortness breath, fever, chills, sore throat, malaise. Pt had positive Covid19 test in clinic on 12/14 , and was started on Paxlovid without improvement.  Her shortness breath has been progressively worsening.  Patient is normally not using oxygen, but was found to have oxygen desaturation to 82% on room air, which improved to 95% on 2 L oxygen.  Patient is constipated, has nausea, but no vomiting or abdominal pain.  No symptoms of UTI."    Assessment / Plan / Recommendation  Clinical Impression  Pt seen for clincial swallowing evaluation. Pt alert, pleasant, and cooperative. Endorsed odynophagia since COVID-19 diagnosis as well as hx of retrosternal globus sensation with solids. Pt reports alleviation of c/o when consuming moist solids and while using a liquid wash  following solids. GI hx significant for Barrett's esophagus and GERD. Noted baseline congestive cough. On 1L/min O2 via [place].   CT Chest "1. No acute findings in the thorax. 2. Small 3 mm right upper lobe pulmonary nodule, nonspecific, but statistically likely benign. No follow-up needed if patient is low-risk.This recommendation follows the consensus statement: Guidelines for Management of Incidental Pulmonary Nodules Detected on CT Images: From the Fleischner Society 2017; Radiology 2017; 284:228-243. 3. Aortic atherosclerosis, in  addition to left main and left anterior descending coronary artery disease. 4. Fluid-filled patulous esophagus. 5. Small hiatal hernia. 6. Additional incidental findings, as above."  Oral motor examination completed and significant for xerostomia and mildly hoarse vocal quality. No obvious functional weakness/changes to sensation noted.   Pt given trials of solid (x1 saltine cracker), pureed (~ 2 oz), and thin liquids (via cup and straw; ~4 oz). Pt demonstrated an intact oral swallow. Pharyngeal swallow appeared [facility] per clinical assessment. No overt s/sx pharyngeal dysphagia. Seemingly timely swallow initiation and seemingly ade quate laryngeal elevation to palpation. Mild improvement in vocal hoarseness appreciated with POs. Retrosternal globus sensation was reproduced during evaluation and improved with liquid wash. Pt's complaints seem most c/w esophageal dysphagia. At present, SLP has no concern for a pharyngeal dysphagia.  Recommend continuation of a regular diet (well moistened solids) with thin liquids with safe swallowing strategies/aspiration precautions/reflux precautions as outlined below.   Reviewed results, recommendations, and SLP POC with pt. Pt verbalized understanding/agreement. RN also amde aware.   SLP to sign off as pt has no acute SLP needs. Consider GI f/u at d/c given pt's complaints and hx.    SLP Visit Diagnosis: Dysphagia, pharyngoesophageal phase (R13.14)    Aspiration Risk  Mild aspiration risk    Diet Recommendation Regular;Thin liquid   Medication Administration: Whole meds with puree (vs crushed in puree) Supervision: Patient able to self feed Compensations: Minimize environmental distractions;Slow rate;Small sips/bites Postural Changes: Seated upright at 90 degrees;Remain upright for at least 30 minutes after po intake    Other  Recommendations Recommended Consults: Consider GI evaluation;Consider esophageal assessment Oral Care Recommendations: Oral care  QID;Patient independent with oral care    Recommendations for follow up therapy are one component of a multi-disciplinary discharge planning process, led by the attending physician.  Recommendations may be updated based on patient status, additional functional criteria and insurance authorization.  Follow up Recommendations No SLP follow up      Functional Status Assessment Patient has not had a recent decline in their functional status         Prognosis Prognosis for Safe Diet Advancement: Good      Swallow Study   General Date of Onset: 04/25/22 HPI: Per H&P "KALEYAH LABRECK is a 81 y.o. 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     Patient states that she has been sick for several days.  She has dry cough, shortness breath, fever, chills, sore throat, malaise. Pt had positive Covid19 test in clinic on 12/14 , and was started on Paxlovid without improvement.  Her shortness breath has been progressively worsening.  Patient is normally not using oxygen, but was found to have oxygen desaturation to 82% on room air, which improved to 95% on 2 L oxygen.  Patient is constipated, has nausea, but no vomiting or abdominal pain.  No symptoms of UTI." Type of  Study: Bedside Swallow Evaluation Diet Prior to this Study: Regular;Thin liquids Respiratory Status: Nasal cannula (1L/min) History of Recent Intubation: No Behavior/Cognition: Alert;Cooperative;Pleasant mood Oral Cavity Assessment: Dry Oral Care Completed by SLP: Recent completion by staff Oral Cavity - Dentition: Adequate natural dentition Vision: Functional for self-feeding Self-Feeding Abilities: Able to feed self Patient Positioning: Upright in bed Baseline Vocal Quality: Hoarse Volitional Cough: Strong Volitional  Swallow: Able to elicit    Oral/Motor/Sensory Function Overall Oral Motor/Sensory Function: Within functional limits   Ice Chips Ice chips: Not tested   Thin Liquid Thin Liquid: Within functional limits Presentation: Cup;Straw Other Comments: ~4 oz    Nectar Thick Nectar Thick Liquid: Not tested   Honey Thick Honey Thick Liquid: Not tested   Puree Puree: Within functional limits Presentation: Self Fed;Spoon Other Comments: ~2 oz   Solid     Solid: Within functional limits Presentation: Self Fed Other Comments: x1 saltine     Cherrie Gauze, M.S., [facility] [phone] ([place])  Clearnce Sorrel Morgan Rennert 04/26/2022,12:29 PM

## 2022-04-26 NOTE — Plan of Care (Signed)

## 2022-04-27 ENCOUNTER — Telehealth: Payer: Self-pay | Admitting: Internal Medicine

## 2022-04-27 DIAGNOSIS — D631 Anemia in chronic kidney disease: Secondary | ICD-10-CM | POA: Insufficient documentation

## 2022-04-27 LAB — CBC
HCT: 25.8 % — ABNORMAL LOW (ref 36.0–46.0)
Hemoglobin: 8.2 g/dL — ABNORMAL LOW (ref 12.0–15.0)
MCH: 31.7 pg (ref 26.0–34.0)
MCHC: 31.8 g/dL (ref 30.0–36.0)
MCV: 99.6 fL (ref 80.0–100.0)
Platelets: 168 10*3/uL (ref 150–400)
RBC: 2.59 MIL/uL — ABNORMAL LOW (ref 3.87–5.11)
RDW: 14.2 % (ref 11.5–15.5)
WBC: 14 10*3/uL — ABNORMAL HIGH (ref 4.0–10.5)
nRBC: 0 % (ref 0.0–0.2)

## 2022-04-27 LAB — BASIC METABOLIC PANEL
Anion gap: 10 (ref 5–15)
BUN: 53 mg/dL — ABNORMAL HIGH (ref 8–23)
CO2: 23 mmol/L (ref 22–32)
Calcium: 9.1 mg/dL (ref 8.9–10.3)
Chloride: 98 mmol/L (ref 98–111)
Creatinine, Ser: 1.97 mg/dL — ABNORMAL HIGH (ref 0.44–1.00)
GFR, Estimated: 25 mL/min — ABNORMAL LOW (ref >60)
Glucose, Bld: 130 mg/dL — ABNORMAL HIGH (ref 70–99)
Potassium: 4.8 mmol/L (ref 3.5–5.1)
Sodium: 131 mmol/L — ABNORMAL LOW (ref 135–145)

## 2022-04-27 MED ORDER — PREDNISONE 10 MG PO TABS: ORAL_TABLET | ORAL | 0 refills | Status: AC

## 2022-04-27 MED ORDER — ALBUTEROL SULFATE HFA 108 (90 BASE) MCG/ACT IN AERS: 2.0000 | INHALATION_SPRAY | RESPIRATORY_TRACT | 0 refills | Status: DC | PRN

## 2022-04-27 NOTE — Telephone Encounter (Signed)
Late entry- On 12/16-I called patient's daughter to check on the patient-recent diagnosis of COVID/immunocompromised state.  Currently on Paxlovid.  I again reviewed the ER triggers.  GB

## 2022-04-27 NOTE — TOC Transition Note (Signed)
Transition of Care [facility]) - CM/SW Discharge Note   Patient Details  Name: Sydney Gillespie MRN: [id number] Date of Birth: 09-Aug-1940  [facility]) CM/SW Contact:  Candie Chroman, LCSW Phone Number: 04/27/2022, 11:35 AM   Clinical Narrative:   Patient has orders to discharge home today. No home health preference. Set up with [facility] for PT, OT, aide. Ordered RW through [employer]. Patient prefers to pick it up from the store since granddaughter has to leave for work at 12:00. No further concerns. CSW signing off.  Final next level of care: [place] Barriers to Discharge: No Barriers Identified   Patient Goals and CMS Choice     Choice offered to / list presented to : Patient  Discharge Placement                Patient to be transferred to facility by: Granddaughter   Patient and family notified of of transfer: 04/27/22  Discharge Plan and Services                DME Arranged: Gilford Rile rolling DME Agency: [employer] Date DME Agency Contacted: 04/27/22   Representative spoke with at DME Agency: Suanne Marker HH Arranged: PT, OT, Nurse's Aide [place] Agency: [place] ([place]) Date [place]: 04/27/22   Representative spoke with at [place]: Floydene Flock  Social Determinants of Health (SDOH) Interventions     Readmission Risk Interventions     No data to display

## 2022-04-27 NOTE — Evaluation (Addendum)
Physical Therapy Evaluation Patient Details Name: Sydney Gillespie MRN: [id number] DOB: May 05, 1941 Today's Date: 04/27/2022  History of Present Illness  presented to ER secondary to progressive SOB; admitted for management of acute respiratory disease secondary to COVID-19  Clinical Impression  Patient resting in bed upon arrival to room; alert and oriented, follows commands and agreeable to participation with session.  Grand-daughter at bedside, pleasant and supportive throughout session.  Denies pain; does endorse improvement in respiratory status since admission.  Bilat UE/LE strength and ROM grossly symmetrical and WFL; no focal weakness appreciated.  Able to complete bed mobility with mod indep; sit/stand, basic transfers and gait (50') without assist device, cga/min assist.  Demonstrates narrowed, scissoring BOS requiring min assist from therapist for balance correction at times.  Does tend to reach for walls, furniture throughout distance due to balance deficits. Additional gait trial completed with RW for additional support; 100' with RW, cga/close sup-reciprocal stepping pattern; improved foot placement and overall gait symmetry.  Do recommend continued use of RW with all mobility tasks; patient/granddaughter voice agreement and understanding. Of note, sats maintained >90% on RA at rest and with exertion throughout session.  OT at bedside and to provide continued education on activity pacing/energy conservation at discharge. Would benefit from skilled PT to address above deficits and promote optimal return to PLOF.; Recommend transition to HHPT upon discharge from acute hospitalization.     Durable Medical Equipment  (From admission, onward)           Start     Ordered   04/27/22 1124  For home use only DME Walker rolling  Once       Question Answer Comment  Walker: With [place] Wheels   Patient needs a walker to treat with the following condition Muscle weakness (generalized)       04/27/22 1124                Recommendations for follow up therapy are one component of a multi-disciplinary discharge planning process, led by the attending physician.  Recommendations may be updated based on patient status, additional functional criteria and insurance authorization.  Follow Up Recommendations Home health PT      Assistance Recommended at Discharge PRN  Patient can return home with the following  A little help with walking and/or transfers;A little help with bathing/dressing/bathroom    Equipment Recommendations Rolling walker (2 wheels)  Recommendations for Other Services       Functional Status Assessment Patient has had a recent decline in their functional status and demonstrates the ability to make significant improvements in function in a reasonable and predictable amount of time.     Precautions / Restrictions Precautions Precautions: Fall Restrictions Weight Bearing Restrictions: No      Mobility  Bed Mobility Overal bed mobility: Modified Independent                  Transfers Overall transfer level: Needs assistance   Transfers: Sit to/from Stand Sit to Stand: Supervision, Min guard           General transfer comment: sit/stand from various surfaces with and without UE support, cga/close sup throughout    Ambulation/Gait Ambulation/Gait assistance: Min guard, Min assist Gait Distance (Feet): 50 Feet Assistive device: None         General Gait Details: narrowed, scissoring BOS requiring min assist from therapist for balance correction at times.  Does tend to reach for walls, furniture throughout distance due to balance deficits  Stairs  Wheelchair Mobility    Modified Rankin (Stroke Patients Only)       Balance Overall balance assessment: Needs assistance Sitting-balance support: No upper extremity supported, Feet supported Sitting balance-Leahy Scale: Good     Standing balance support: No  upper extremity supported Standing balance-Leahy Scale: Fair                               Pertinent Vitals/Pain Pain Assessment Pain Assessment: No/denies pain    Home Living Family/patient expects to be discharged to:: Private residence Living Arrangements: Other relatives Theatre manager) Available Help at Discharge: Family Type of Home: House Home Access: Stairs to enter Entrance Stairs-Rails: Right;Left;Can reach both Technical brewer of Steps: 5-8   Home Layout: One level Home Equipment: None      Prior Function Prior Level of Function : Independent/Modified Independent             Mobility Comments: Indep with ADLs, household and community mobilization without assist device (furniture cruises frequently); endorses multiple fall history (typically able ot self-recover)       Hand Dominance   Dominant Hand: Right    Extremity/Trunk Assessment   Upper Extremity Assessment Upper Extremity Assessment: Overall WFL for tasks assessed (grossly 4+/5 throughout)    Lower Extremity Assessment Lower Extremity Assessment: Overall WFL for tasks assessed (grossly 4+/5 throughout)       Communication   Communication: No difficulties  Cognition Arousal/Alertness: Awake/alert Behavior During Therapy: WFL for tasks assessed/performed Overall Cognitive Status: Within Functional Limits for tasks assessed                                          General Comments      Exercises Other Exercises Other Exercises: Toilet transfer, ambulatory without assist device, cga/min assist.  Slightly impulsive (due to urge/stress incontinence), min cuing for safety and dynamic balance with task.  Sit/stand from standard toilet, cga/close sup.  Indep manages peri-care, hygiene and clothing (changing gown, socks) Other Exercises: 100' with RW, cga/close sup-reciprocal stepping pattern; improved foot placement and overall gait symmetry.  Do recommend  continued use of RW with all mobility tasks; patient/granddaughter voice agreement and understanding.   Assessment/Plan    PT Assessment Patient needs continued PT services  PT Problem List Decreased activity tolerance;Decreased balance;Decreased mobility;Decreased coordination;Decreased safety awareness;Decreased knowledge of precautions;Cardiopulmonary status limiting activity       PT Treatment Interventions DME instruction;Gait training;Stair training;Functional mobility training;Therapeutic activities;Therapeutic exercise;Balance training;Patient/family education    PT Goals (Current goals can be found in the Care Plan section)  Acute Rehab PT Goals Patient Stated Goal: to return home PT Goal Formulation: With patient/family Time For Goal Achievement: 05/11/22    Frequency Min 2X/week     Co-evaluation               AM-PAC PT "6 Clicks" Mobility  Outcome Measure Help needed turning from your back to your side while in a flat bed without using bedrails?: None Help needed moving from lying on your back to sitting on the side of a flat bed without using bedrails?: None Help needed moving to and from a bed to a chair (including a wheelchair)?: A Little Help needed standing up from a chair using your arms (e.g., wheelchair or bedside chair)?: A Little Help needed to walk in hospital room?: A Little Help needed  climbing 3-5 steps with a railing? : A Little 6 Click Score: 20    End of Session   Activity Tolerance: Patient tolerated treatment well Patient left: in bed;with call bell/phone within reach;with family/visitor present (OT at bedside for evaluation) Nurse Communication: Mobility status PT Visit Diagnosis: Muscle weakness (generalized) (M62.81);Difficulty in walking, not elsewhere classified (R26.2)    Time: 7628-3151 PT Time Calculation (min) (ACUTE ONLY): 29 min   Charges:   PT Evaluation $PT Eval Moderate Complexity: 1 Mod PT Treatments $Therapeutic  Activity: 8-22 mins       Tristy Udovich H. Owens Shark, PT, DPT, NCS 04/27/22, 11:22 AM [phone]

## 2022-04-27 NOTE — Evaluation (Signed)
Occupational Therapy Evaluation Patient Details Name: Sydney Gillespie MRN: [id number] DOB: 01-17-1941 Today's Date: 04/27/2022   History of Present Illness Sydney Gillespie is a 81 y.o. 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 Presented to ER secondary to progressive SOB; admitted for management of acute respiratory disease secondary to COVID-19   Clinical Impression   Sydney Gillespie was seen for OT evaluation this date. Prior to hospital admission, pt was IND for mobility and ADLs. Pt lives with granddaughter. Pt presents to acute OT demonstrating impaired ADL performance and functional mobility 2/2 decreased activity tolerance and functional balance deficits. Upon arrival pt seated on toilet with PT and daughter at bed side. Pt currently requires CGA toilet t/f, pt furniture walks, improves with RW use to SBA. MOD I don B socks seated on toilet. SUPERVISION don gown and underwear in standing - cues for seated rest breaks. Educated on ECS and IS/flutter valve use with fair return demonstration. Pt would benefit from skilled OT to address noted impairments and functional limitations (see below for any additional details). Upon hospital discharge, recommend HHOT to maximize pt safety and return to PLOF.    Recommendations for follow up therapy are one component of a multi-disciplinary discharge planning process, led by the attending physician.  Recommendations may be updated based on patient status, additional functional criteria and insurance authorization.   Follow Up Recommendations  Home health OT     Assistance Recommended at Discharge Set up Supervision/Assistance  Patient can return home with the following Help with stairs or ramp for entrance;A little help with walking and/or transfers    Functional Status  Assessment  Patient has had a recent decline in their functional status and demonstrates the ability to make significant improvements in function in a reasonable and predictable amount of time.  Equipment Recommendations  Other (comment) (2WW)    Recommendations for Other Services       Precautions / Restrictions Precautions Precautions: Fall Restrictions Weight Bearing Restrictions: No      Mobility Bed Mobility Overal bed mobility: Modified Independent                  Transfers Overall transfer level: Needs assistance   Transfers: Sit to/from Stand Sit to Stand: Supervision, Min guard                  Balance Overall balance assessment: Needs assistance Sitting-balance support: No upper extremity supported, Feet supported Sitting balance-Leahy Scale: Normal     Standing balance support: No upper extremity supported, During functional activity Standing balance-Leahy Scale: Fair                             ADL either performed or assessed with clinical judgement   ADL Overall ADL's : Needs assistance/impaired                                       General ADL Comments: MOD I don B socks seated on toilet. SUPERVISION don gown and underwear in standing - cues for seated rest breaks. CGA toilet t/f, pt furniture walks, improves with RW use to SBA.      Pertinent Vitals/Pain Pain Assessment Pain Assessment: No/denies pain     Hand Dominance Right  Extremity/Trunk Assessment Upper Extremity Assessment Upper Extremity Assessment: Overall WFL for tasks assessed   Lower Extremity Assessment Lower Extremity Assessment: Overall WFL for tasks assessed       Communication Communication Communication: No difficulties   Cognition Arousal/Alertness: Awake/alert Behavior During Therapy: WFL for tasks assessed/performed Overall Cognitive Status: Within Functional Limits for tasks assessed                                                   Home Living Family/patient expects to be discharged to:: Private residence Living Arrangements: Other relatives Theatre manager) Available Help at Discharge: Family Type of Home: House Home Access: Stairs to enter Technical brewer of Steps: 5-8 Entrance Stairs-Rails: Right;Left;Can reach both Home Layout: One level               Home Equipment: None          Prior Functioning/Environment Prior Level of Function : Independent/Modified Independent             Mobility Comments: Indep with ADLs, household and community mobilization without assist device (furniture cruises frequently); endorses multiple fall history (typically able ot self-recover)          OT Problem List: Decreased activity tolerance;Impaired balance (sitting and/or standing);Decreased safety awareness      OT Treatment/Interventions: Self-care/ADL training;Therapeutic exercise;Energy conservation;DME and/or AE instruction;Therapeutic activities;Patient/family education;Balance training    OT Goals(Current goals can be found in the care plan section) Acute Rehab OT Goals Patient Stated Goal: to go home OT Goal Formulation: With patient/family Time For Goal Achievement: 05/11/22 Potential to Achieve Goals: Good ADL Goals Pt Will Perform Grooming: Independently;standing (tolerate >10 mins utilizing  ECS with no cues) Pt Will Perform Lower Body Dressing: sit to/from stand Pt Will Transfer to Toilet: ambulating;regular height toilet;with modified independence  OT Frequency: Min 2X/week    Co-evaluation              AM-PAC OT "6 Clicks" Daily Activity     Outcome Measure Help from another person eating meals?: None Help from another person taking care of personal grooming?: A Little Help from another person toileting, which includes using toliet, bedpan, or urinal?: A Little Help from another person bathing (including washing, rinsing, drying)?: A  Little Help from another person to put on and taking off regular upper body clothing?: None Help from another person to put on and taking off regular lower body clothing?: None 6 Click Score: 21   End of Session Equipment Utilized During Treatment: Rolling walker (2 wheels)  Activity Tolerance: Patient tolerated treatment well Patient left: in bed;with call bell/phone within reach;with family/visitor present  OT Visit Diagnosis: Other abnormalities of gait and mobility (R26.89);Muscle weakness (generalized) (M62.81)                Time: 4492-0100 OT Time Calculation (min): 21 min Charges:  OT General Charges $OT Visit: 1 Visit OT Evaluation $OT Eval Low Complexity: 1 Low  Dessie Coma, M.S. OTR/L  04/27/22, 11:22 AM  ascom [phone]

## 2022-04-27 NOTE — Discharge Instructions (Signed)
Follow-up with PCP in 1 week. Follow-up with your GI doctor for EGD.

## 2022-04-27 NOTE — Discharge Summary (Addendum)
Physician Discharge Summary   Patient: Sydney Gillespie MRN: [id number] DOB: April 21, 1941  Admit date:     04/25/2022  Discharge date: 04/27/22  Discharge Physician: Sharen Hones   PCP: [name], MD   Recommendations at discharge:   Follow-up with PCP in 1 week. Follow-up with your GI doctor in 1 to 2 weeks for EGD for esophageal stricture. Follow-up with noncontrast CT scan for lung nodule.  Discharge Diagnoses: Principal Problem:   Acute respiratory disease due to COVID-19 virus Active Problems:   Multiple myeloma (HCC)   CKD (chronic kidney disease), stage IV (HCC)   Gout   Rheumatoid arthritis (HCC)   Hypothyroidism   Iron deficiency anemia   Depression with anxiety   Barrett esophagus   Esophageal stenosis   Anemia of chronic kidney failure Hyponatremia. Acute hypoxia due to covid. Acute hypoxemia ruled out Resolved Problems:   * No resolved hospital problems. *  Hospital Course: Sydney Gillespie is a 81 y.o. 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  She was tested positive for COVID 3 days ago, was started on Paxlovid on 12/14.  Upon arriving the hospital, she was hypoxic with 82% oxygen saturation on room air.  She was placed on 2 L oxygen.   She is treated with IV steroids in addition to Paxlovid.  Assessment and Plan:  Acute hypoxia secondary to COVID infection. COVID infection. Hypoxemia respiratory failure ruled out.  Patient did not meet criteria. Reviewed patient CT scan and x-ray results, independently reviewed the images.  Patient does not have any infiltrates/consolidation to indicate pneumonia. Treated IV Solu-Medrol and Paxlovid. Condition has improved, off oxygen, no desaturation with mobility.  Condition improved for discharge.   Continue steroid taper, complete Paxlovid.  Lung nodule. Follow-up with PCP as outpatient.   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 Patient is followed by oncology as outpatient.   Dysphagia. Esophageal stenosis with Barrett esophagus. Patient has been complaining of intermittent dysphagia, she had a history of esophageal stenosis, last EGD was performed 5 years ago, she is due for another esophageal dilation. CT scan showed fluid-filled esophagus, however, patient does not have any aspiration pneumonia.  Will obtain speech therapy evaluation to provide guidance to avoid aspiration.  Patient be followed with GI as outpatient.   Chronic kidney disease stage IV. Renal function still stable.     Rheumatoid arthritis On Plaquenil.   Iron deficient anemia. Anemia of chronic kidney disease. Globin 8.2, no active bleeding.  Follow-up with PCP as outpatient.       Consultants: None Procedures performed: None  Disposition: Home health Diet recommendation:  Discharge Diet Orders (From admission, onward)     Start     Ordered   04/27/22 0000  Diet - low sodium heart healthy        04/27/22 1114           Cardiac diet DISCHARGE MEDICATION: Allergies as of 04/27/2022       Reactions   Isoniazid Other (See Comments)   Blisters         Medication List     STOP taking these medications    ferrous sulfate 324 MG Tbec   Prolia 60 MG/ML Sosy injection Generic drug: denosumab       TAKE these medications    acetaminophen 325 MG tablet Commonly known as: TYLENOL Take  650 mg by mouth every 6 (six) hours as needed for fever or headache.   acyclovir 400 MG tablet Commonly known as: ZOVIRAX Take 1 tablet (400 mg total) by mouth 2 (two) times daily.   albuterol 108 (90 Base) MCG/ACT inhaler Commonly known as: VENTOLIN HFA Inhale 2 puffs into the lungs every 4 (four) hours as needed for wheezing or shortness of breath.   allopurinol 300 MG  tablet Commonly known as: ZYLOPRIM Take 450 mg by mouth daily.   ALPRAZolam 0.25 MG tablet Commonly known as: XANAX TAKE 1 TABLET(0.25 MG) BY MOUTH AT BEDTIME AS NEEDED FOR ANXIETY   atenolol 50 MG tablet Commonly known as: TENORMIN Take 0.5 tablets (25 mg total) by mouth daily. What changed: how much to take   benzonatate 200 MG capsule Commonly known as: TESSALON Take 1 capsule (200 mg total) by mouth 3 (three) times daily as needed for cough.   CALCIUM MAGNESIUM ZINC PO Take 1 tablet by mouth in the morning, at noon, and at bedtime.   cetirizine 10 MG tablet Commonly known as: ZYRTEC Take 10 mg by mouth 2 (two) times daily.   chlorpheniramine-HYDROcodone 10-8 MG/5ML Commonly known as: TUSSIONEX Take 5 mLs by mouth every 12 (twelve) hours as needed (for cough unrelieved by other medications). May cause drowsiness.   cyanocobalamin 1000 MCG tablet Commonly known as: VITAMIN B12 Take 1,000 mcg by mouth daily.   docusate sodium 100 MG capsule Commonly known as: COLACE Take 100 mg by mouth 2 (two) times daily.   DULoxetine 20 MG capsule Commonly known as: CYMBALTA Take 20 mg by mouth 2 (two) times daily.   fluticasone 50 MCG/ACT nasal spray Commonly known as: FLONASE Place 2 sprays into the nose daily.   hydroxychloroquine 200 MG tablet Commonly known as: PLAQUENIL Take 200 mg by mouth 2 (two) times daily.   lenalidomide 10 MG capsule Commonly known as: REVLIMID Take 1 capsule (10 mg total) by mouth daily. Take for 21 days, then hold for 7 days. Repeat every 28 days.   levothyroxine 112 MCG tablet Commonly known as: SYNTHROID Take 112 mcg by mouth daily before breakfast.   Myrbetriq 50 MG Tb24 tablet Generic drug: mirabegron ER Take 50 mg by mouth daily.   nirmatrelvir/ritonavir EUA (renal dosing) 10 x 150 MG & 10 x 100MG Tabs Commonly known as: PAXLOVID Take 2 tablets by mouth 2 (two) times daily for 5 days. Take nirmatrelvir (150 mg) one tablet twice  daily for 5 days and ritonavir (100 mg) one tablet twice daily for 5 days.   oxybutynin 10 MG 24 hr tablet Commonly known as: DITROPAN-XL Take 1 tablet (10 mg total) by mouth daily.   Potassium 99 MG Tabs Take 1 tablet by mouth daily.   predniSONE 10 MG tablet Commonly known as: DELTASONE Take 4 tablets (40 mg total) by mouth daily with breakfast for 3 days, THEN 2 tablets (20 mg total) daily with breakfast for 3 days, THEN 1 tablet (10 mg total) daily with breakfast for 2 days. Start taking on: April 27, 2022   rOPINIRole 0.5 MG tablet Commonly known as: REQUIP Take 0.75 mg by mouth at bedtime as needed (Restless leg).   vitamin C 1000 MG tablet Take 1,000 mg by mouth daily.        Follow-up Information     [name], MD Follow up in 1 week(s).   Specialty: Internal Medicine Contact information: [address] [place] [address] [phone]  Discharge Exam: Filed Weights   04/25/22 0639 04/26/22 0500  Weight: 69.4 kg 69.4 kg   General exam: Appears calm and comfortable  Respiratory system: Clear to auscultation. Respiratory effort normal. Cardiovascular system: S1 & S2 heard, RRR. No JVD, murmurs, rubs, gallops or clicks. No pedal edema. Gastrointestinal system: Abdomen is nondistended, soft and nontender. No organomegaly or masses felt. Normal bowel sounds heard. Central nervous system: Alert and oriented. No focal neurological deficits. Extremities: Symmetric 5 x 5 power. Skin: No rashes, lesions or ulcers Psychiatry: Judgement and insight appear normal. Mood & affect appropriate.    Condition at discharge: fair  The results of significant diagnostics from this hospitalization (including imaging, microbiology, ancillary and laboratory) are listed below for reference.   Imaging Studies: US Venous Img Lower Bilateral (DVT)  Result Date: 04/25/2022 CLINICAL DATA:  Positive D-dimer EXAM: BILATERAL LOWER EXTREMITY  VENOUS DOPPLER ULTRASOUND TECHNIQUE: Gray-scale sonography with compression, as well as color and duplex ultrasound, were performed to evaluate the deep venous system(s) from the level of the common femoral vein through the popliteal and proximal calf veins. COMPARISON:  None Available. FINDINGS: VENOUS Normal compressibility of the common femoral, superficial femoral, and popliteal veins, as well as the visualized calf veins. Visualized portions of profunda femoral vein and great saphenous vein unremarkable. No filling defects to suggest DVT on grayscale or color Doppler imaging. Doppler waveforms show normal direction of venous flow, normal respiratory plasticity and response to augmentation. Limited views of the contralateral common femoral vein are unremarkable. OTHER None. Limitations: none IMPRESSION: Negative. Electronically Signed   By: Dorise Bullion III M.D.   On: 04/25/2022 12:31   NM Pulmonary Perfusion  Result Date: 04/25/2022 CLINICAL DATA:  PE suspected. Patient is COVID-19 positive. Shortness of breath. EXAM: NUCLEAR MEDICINE PERFUSION LUNG SCAN TECHNIQUE: Perfusion images were obtained in multiple projections after intravenous injection of radiopharmaceutical. Ventilation scans intentionally deferred if perfusion scan and chest x-ray adequate for interpretation during COVID 19 epidemic. RADIOPHARMACEUTICALS:  4.31 mCi Tc-62mMAA IV COMPARISON:  CT of the chest April 25, 2022 FINDINGS: No segmental perfusion defects identified. IMPRESSION: No segmental perfusion defects identified. No evidence of pulmonary embolus on this study. Electronically Signed   By: DDorise BullionIII M.D.   On: 04/25/2022 11:50   CT Chest Wo Contrast  Result Date: 04/25/2022 CLINICAL DATA:  81year old female with history of shortness of breath. Recent diagnosis of COVID 2 days ago. EXAM: CT CHEST WITHOUT CONTRAST TECHNIQUE: Multidetector CT imaging of the chest was performed following the standard protocol  without IV contrast. RADIATION DOSE REDUCTION: This exam was performed according to the departmental dose-optimization program which includes automated exposure control, adjustment of the mA and/or kV according to patient size and/or use of iterative reconstruction technique. COMPARISON:  No priors. FINDINGS: Cardiovascular: Heart size is normal. There is no significant pericardial fluid, thickening or pericardial calcification. There is aortic atherosclerosis, as well as atherosclerosis of the great vessels of the mediastinum and the coronary arteries, including calcified atherosclerotic plaque in the left main and left anterior descending coronary arteries. Mediastinum/Nodes: No pathologically enlarged mediastinal or hilar lymph nodes. Please note that accurate exclusion of hilar adenopathy is limited on noncontrast CT scans. Patulous fluid-filled esophagus. Small hiatal hernia. No axillary lymphadenopathy. Lungs/Pleura: Azygous lobe (normal anatomical variant) incidentally noted. Dependent areas of mild subsegmental atelectasis and/or scarring are noted in the lungs bilaterally. No acute consolidative airspace disease. No pleural effusions. 3 mm right upper lobe pulmonary nodule (axial image 64 of series  3). No other suspicious appearing pulmonary nodules or masses are noted. Small calcified granuloma in the right upper lobe. Upper Abdomen: Aortic atherosclerosis. Status post cholecystectomy. Calcified granulomas in the spleen incidentally noted. Musculoskeletal: Orthopedic fixation hardware in the lower cervical spine incidentally noted. Chronic appearing compression fractures of T7 superior endplate and L79 superior endplate, most severe at T12 where there is up to 25% loss of central vertebral body height. There are no aggressive appearing lytic or blastic lesions noted in the visualized portions of the skeleton. IMPRESSION: 1. No acute findings in the thorax. 2. Small 3 mm right upper lobe pulmonary nodule,  nonspecific, but statistically likely benign. No follow-up needed if patient is low-risk.This recommendation follows the consensus statement: Guidelines for Management of Incidental Pulmonary Nodules Detected on CT Images: From the Fleischner Society 2017; Radiology 2017; 284:228-243. 3. Aortic atherosclerosis, in addition to left main and left anterior descending coronary artery disease. 4. Fluid-filled patulous esophagus. 5. Small hiatal hernia. 6. Additional incidental findings, as above. Aortic Atherosclerosis (ICD10-I70.0). Electronically Signed   By: Vinnie Langton M.D.   On: 04/25/2022 09:04   DG Chest Port 1 View  Result Date: 04/25/2022 CLINICAL DATA:  SOB EXAM: PORTABLE CHEST 1 VIEW COMPARISON:  04/23/2022 FINDINGS: The heart size and mediastinal contours are within normal limits. Both lungs are clear. No pneumothorax or pleural effusion. Aorta is calcified the visualized skeletal structures are unremarkable. IMPRESSION: No active disease. Electronically Signed   By: Sammie Bench M.D.   On: 04/25/2022 07:13   DG Chest 2 View  Result Date: 04/23/2022 CLINICAL DATA:  Hypotension, shortness of breath EXAM: CHEST - 2 VIEW COMPARISON:  Chest radiograph 11/27/2018 FINDINGS: The cardiomediastinal silhouette is normal. There is no focal consolidation or pulmonary edema. There is no pleural effusion or pneumothorax. There is no acute osseous abnormality. IMPRESSION: No radiographic evidence of acute cardiopulmonary process. Electronically Signed   By: Valetta Mole M.D.   On: 04/23/2022 10:50   DG Bone Survey Met  Result Date: 04/08/2022 CLINICAL DATA:  History of multiple myeloma EXAM: METASTATIC BONE SURVEY COMPARISON:  02/15/2020 FINDINGS: Metastatic bone survey was performed. Cardiac shadow is within normal limits. The lungs are well aerated bilaterally. No lytic or sclerotic lesions are noted in the chest. Postsurgical changes are noted in the cervical spine stable in appearance from the  prior study. No lytic or sclerotic lesions are seen. Multilevel degenerative changes are noted. Patient is edentulous. No significant lucencies are noted within the skull. Degenerative changes about shoulder joints are seen right greater than left. The upper extremities show no focal lytic or sclerotic lesions. Thoracic and lumbar spine demonstrate degenerative changes. T12 compression fracture is noted. This is chronic in nature. No other compression deformity is seen. InterStim device is noted in the pelvis. Postsurgical changes in the lower lumbar spine are noted. No lytic or sclerotic lesions are seen in the lower extremities. IMPRESSION: No lytic or sclerotic lesions are identified. Electronically Signed   By: Inez Catalina M.D.   On: 04/08/2022 19:53   CT BONE MARROW BIOPSY & ASPIRATION  Result Date: 03/31/2022 INDICATION: 81 year old with multiple myeloma. EXAM: CT GUIDED BONE MARROW ASPIRATES AND BIOPSY Physician: Stephan Minister. Henn, MD MEDICATIONS: Moderate sedation ANESTHESIA/SEDATION: Moderate (conscious) sedation was employed during this procedure. A total of Versed 39m and fentanyl 50 mcg was administered intravenously at the order of the provider performing the procedure. Total intra-service moderate sedation time: 12 minutes. Patient's level of consciousness and vital signs were monitored continuously by  radiology nurse throughout the procedure under the supervision of the provider performing the procedure. COMPLICATIONS: None immediate. PROCEDURE: The procedure was explained to the patient. The risks and benefits of the procedure were discussed and the patient's questions were addressed. Informed consent was obtained from the patient. The patient was placed prone on CT table. Images of the pelvis were obtained. The left side of back was prepped and draped in sterile fashion. The skin and left posterior ilium were anesthetized with 1% lidocaine. 11 gauge bone needle was directed into the left ilium with  CT guidance. Two aspirates and one core biopsy were obtained. Bandage placed over the puncture site. RADIATION DOSE REDUCTION: This exam was performed according to the departmental dose-optimization program which includes automated exposure control, adjustment of the mA and/or kV according to patient size and/or use of iterative reconstruction technique. IMPRESSION: CT guided bone marrow aspiration and core biopsy. Electronically Signed   By: Markus Daft M.D.   On: 03/31/2022 10:06    Microbiology: Results for orders placed or performed during the hospital encounter of 04/25/22  Blood culture (routine x 2)     Status: None (Preliminary result)   Collection Time: 04/25/22  6:46 AM   Specimen: BLOOD RIGHT FOREARM  Result Value Ref Range Status   Specimen Description BLOOD RIGHT FOREARM  Final   Special Requests   Final    BOTTLES DRAWN AEROBIC AND ANAEROBIC Blood Culture adequate volume   Culture   Final    NO GROWTH 2 DAYS Performed at [facility], [address] [place], [place] [address]    Report Status PENDING  Incomplete  Blood culture (routine x 2)     Status: None (Preliminary result)   Collection Time: 04/25/22  6:46 AM   Specimen: Right Antecubital; Blood  Result Value Ref Range Status   Specimen Description RIGHT ANTECUBITAL  Final   Special Requests   Final    BOTTLES DRAWN AEROBIC AND ANAEROBIC Blood Culture adequate volume   Culture   Final    NO GROWTH 2 DAYS Performed at [facility], [address] [place], [place] [address]    Report Status PENDING  Incomplete    Labs: CBC: Recent Labs  Lab 04/23/22 0825 04/25/22 0646 04/26/22 0311 04/27/22 0408  WBC 9.5 5.5 5.6 14.0*  NEUTROABS 8.2*  --   --   --   HGB 9.6* 9.1* 9.0* 8.2*  HCT 29.2* 29.1* 28.6* 25.8*  MCV 99.7 103.2* 102.9* 99.6  PLT 151 164 158 098   Basic Metabolic Panel: Recent Labs  Lab 04/23/22 0825 04/25/22 0646 04/26/22 0311 04/27/22 0408  NA 132* 132* 132* 131*  K 4.1  4.2 4.7 4.8  CL 97* 100 100 98  CO2 _0 GLUCOSE 104* 98 127* 130*  BUN 41* 48* 54* 53*  CREATININE 2.26* 2.29* 2.17* 1.97*  CALCIUM 9.2 8.9 9.2 9.1   Liver Function Tests: Recent Labs  Lab 04/23/22 0825 04/25/22 0646  AST 28 38  ALT 9 15  ALKPHOS 48 56  BILITOT 0.6 0.6  PROT 6.6 6.6  ALBUMIN 3.6 3.6   CBG: No results for input(s): "GLUCAP" in the last 168 hours.  Discharge time spent: greater than 30 minutes.  Signed: Sharen Hones, MD [facility] 04/27/2022

## 2022-04-30 ENCOUNTER — Ambulatory Visit: Payer: Medicare Other

## 2022-04-30 ENCOUNTER — Other Ambulatory Visit: Payer: Medicare Other

## 2022-04-30 LAB — CULTURE, BLOOD (ROUTINE X 2)
Culture: NO GROWTH
Culture: NO GROWTH
Special Requests: ADEQUATE
Special Requests: ADEQUATE

## 2022-05-06 ENCOUNTER — Emergency Department: Payer: Medicare Other

## 2022-05-06 ENCOUNTER — Other Ambulatory Visit: Payer: Self-pay

## 2022-05-06 ENCOUNTER — Telehealth: Payer: Self-pay | Admitting: *Deleted

## 2022-05-06 ENCOUNTER — Inpatient Hospital Stay
Admission: EM | Admit: 2022-05-06 | Discharge: 2022-05-10 | DRG: 177 | Disposition: A | Discharge status: Home Health | Payer: Medicare Other | Attending: Internal Medicine | Admitting: Internal Medicine

## 2022-05-06 DIAGNOSIS — J9601 Acute respiratory failure with hypoxia: Secondary | ICD-10-CM | POA: Diagnosis not present

## 2022-05-06 DIAGNOSIS — R002 Palpitations: Secondary | ICD-10-CM | POA: Diagnosis present

## 2022-05-06 DIAGNOSIS — D849 Immunodeficiency, unspecified: Secondary | ICD-10-CM | POA: Diagnosis present

## 2022-05-06 DIAGNOSIS — K219 Gastro-esophageal reflux disease without esophagitis: Secondary | ICD-10-CM | POA: Diagnosis present

## 2022-05-06 DIAGNOSIS — Z9049 Acquired absence of other specified parts of digestive tract: Secondary | ICD-10-CM

## 2022-05-06 DIAGNOSIS — J309 Allergic rhinitis, unspecified: Secondary | ICD-10-CM | POA: Diagnosis present

## 2022-05-06 DIAGNOSIS — I082 Rheumatic disorders of both aortic and tricuspid valves: Secondary | ICD-10-CM | POA: Diagnosis present

## 2022-05-06 DIAGNOSIS — N184 Chronic kidney disease, stage 4 (severe): Secondary | ICD-10-CM | POA: Diagnosis present

## 2022-05-06 DIAGNOSIS — I2489 Other forms of acute ischemic heart disease: Secondary | ICD-10-CM | POA: Diagnosis present

## 2022-05-06 DIAGNOSIS — F418 Other specified anxiety disorders: Secondary | ICD-10-CM | POA: Diagnosis present

## 2022-05-06 DIAGNOSIS — Z9481 Bone marrow transplant status: Secondary | ICD-10-CM

## 2022-05-06 DIAGNOSIS — U071 COVID-19: Principal | ICD-10-CM | POA: Diagnosis present

## 2022-05-06 DIAGNOSIS — S22000A Wedge compression fracture of unspecified thoracic vertebra, initial encounter for closed fracture: Secondary | ICD-10-CM | POA: Insufficient documentation

## 2022-05-06 DIAGNOSIS — R7989 Other specified abnormal findings of blood chemistry: Secondary | ICD-10-CM

## 2022-05-06 DIAGNOSIS — R7401 Elevation of levels of liver transaminase levels: Secondary | ICD-10-CM | POA: Diagnosis present

## 2022-05-06 DIAGNOSIS — M109 Gout, unspecified: Secondary | ICD-10-CM | POA: Diagnosis present

## 2022-05-06 DIAGNOSIS — M069 Rheumatoid arthritis, unspecified: Secondary | ICD-10-CM | POA: Diagnosis present

## 2022-05-06 DIAGNOSIS — F419 Anxiety disorder, unspecified: Secondary | ICD-10-CM | POA: Diagnosis present

## 2022-05-06 DIAGNOSIS — E663 Overweight: Secondary | ICD-10-CM | POA: Diagnosis present

## 2022-05-06 DIAGNOSIS — Z79890 Hormone replacement therapy (postmenopausal): Secondary | ICD-10-CM

## 2022-05-06 DIAGNOSIS — M064 Inflammatory polyarthropathy: Secondary | ICD-10-CM | POA: Diagnosis present

## 2022-05-06 DIAGNOSIS — I5033 Acute on chronic diastolic (congestive) heart failure: Secondary | ICD-10-CM | POA: Diagnosis present

## 2022-05-06 DIAGNOSIS — Z90710 Acquired absence of both cervix and uterus: Secondary | ICD-10-CM

## 2022-05-06 DIAGNOSIS — D631 Anemia in chronic kidney disease: Secondary | ICD-10-CM | POA: Diagnosis present

## 2022-05-06 DIAGNOSIS — E872 Acidosis, unspecified: Secondary | ICD-10-CM | POA: Diagnosis present

## 2022-05-06 DIAGNOSIS — D539 Nutritional anemia, unspecified: Secondary | ICD-10-CM | POA: Diagnosis present

## 2022-05-06 DIAGNOSIS — J181 Lobar pneumonia, unspecified organism: Secondary | ICD-10-CM | POA: Diagnosis present

## 2022-05-06 DIAGNOSIS — M4854XA Collapsed vertebra, not elsewhere classified, thoracic region, initial encounter for fracture: Secondary | ICD-10-CM | POA: Diagnosis present

## 2022-05-06 DIAGNOSIS — C9000 Multiple myeloma not having achieved remission: Secondary | ICD-10-CM | POA: Diagnosis present

## 2022-05-06 DIAGNOSIS — S22070A Wedge compression fracture of T9-T10 vertebra, initial encounter for closed fracture: Secondary | ICD-10-CM

## 2022-05-06 DIAGNOSIS — J189 Pneumonia, unspecified organism: Secondary | ICD-10-CM

## 2022-05-06 DIAGNOSIS — N189 Chronic kidney disease, unspecified: Secondary | ICD-10-CM | POA: Diagnosis present

## 2022-05-06 DIAGNOSIS — E785 Hyperlipidemia, unspecified: Secondary | ICD-10-CM | POA: Diagnosis present

## 2022-05-06 DIAGNOSIS — M810 Age-related osteoporosis without current pathological fracture: Secondary | ICD-10-CM | POA: Diagnosis present

## 2022-05-06 DIAGNOSIS — E039 Hypothyroidism, unspecified: Secondary | ICD-10-CM | POA: Diagnosis present

## 2022-05-06 DIAGNOSIS — I82461 Acute embolism and thrombosis of right calf muscular vein: Secondary | ICD-10-CM | POA: Diagnosis not present

## 2022-05-06 DIAGNOSIS — F32A Depression, unspecified: Secondary | ICD-10-CM | POA: Diagnosis present

## 2022-05-06 DIAGNOSIS — Z8261 Family history of arthritis: Secondary | ICD-10-CM

## 2022-05-06 DIAGNOSIS — E8809 Other disorders of plasma-protein metabolism, not elsewhere classified: Secondary | ICD-10-CM | POA: Diagnosis present

## 2022-05-06 DIAGNOSIS — I2721 Secondary pulmonary arterial hypertension: Secondary | ICD-10-CM | POA: Diagnosis present

## 2022-05-06 DIAGNOSIS — Z6825 Body mass index (BMI) 25.0-25.9, adult: Secondary | ICD-10-CM

## 2022-05-06 DIAGNOSIS — E871 Hypo-osmolality and hyponatremia: Secondary | ICD-10-CM | POA: Diagnosis present

## 2022-05-06 DIAGNOSIS — Z79899 Other long term (current) drug therapy: Secondary | ICD-10-CM

## 2022-05-06 DIAGNOSIS — Z8249 Family history of ischemic heart disease and other diseases of the circulatory system: Secondary | ICD-10-CM

## 2022-05-06 DIAGNOSIS — Z9981 Dependence on supplemental oxygen: Secondary | ICD-10-CM

## 2022-05-06 LAB — COMPREHENSIVE METABOLIC PANEL
ALT: 16 U/L (ref 0–44)
AST: 45 U/L — ABNORMAL HIGH (ref 15–41)
Albumin: 3.2 g/dL — ABNORMAL LOW (ref 3.5–5.0)
Alkaline Phosphatase: 64 U/L (ref 38–126)
Anion gap: 11 (ref 5–15)
BUN: 40 mg/dL — ABNORMAL HIGH (ref 8–23)
CO2: 23 mmol/L (ref 22–32)
Calcium: 9.1 mg/dL (ref 8.9–10.3)
Chloride: 100 mmol/L (ref 98–111)
Creatinine, Ser: 1.45 mg/dL — ABNORMAL HIGH (ref 0.44–1.00)
GFR, Estimated: 36 mL/min — ABNORMAL LOW (ref >60)
Glucose, Bld: 102 mg/dL — ABNORMAL HIGH (ref 70–99)
Potassium: 4.4 mmol/L (ref 3.5–5.1)
Sodium: 134 mmol/L — ABNORMAL LOW (ref 135–145)
Total Bilirubin: 1 mg/dL (ref 0.3–1.2)
Total Protein: 6.7 g/dL (ref 6.5–8.1)

## 2022-05-06 LAB — CBC
HCT: 28.8 % — ABNORMAL LOW (ref 36.0–46.0)
Hemoglobin: 9 g/dL — ABNORMAL LOW (ref 12.0–15.0)
MCH: 32.5 pg (ref 26.0–34.0)
MCHC: 31.3 g/dL (ref 30.0–36.0)
MCV: 104 fL — ABNORMAL HIGH (ref 80.0–100.0)
Platelets: 209 10*3/uL (ref 150–400)
RBC: 2.77 MIL/uL — ABNORMAL LOW (ref 3.87–5.11)
RDW: 15.7 % — ABNORMAL HIGH (ref 11.5–15.5)
WBC: 14.8 10*3/uL — ABNORMAL HIGH (ref 4.0–10.5)
nRBC: 0 % (ref 0.0–0.2)

## 2022-05-06 LAB — RESP PANEL BY RT-PCR (RSV, FLU A&B, COVID)  RVPGX2
Influenza A by PCR: NEGATIVE
Influenza B by PCR: NEGATIVE
Resp Syncytial Virus by PCR: NEGATIVE
SARS Coronavirus 2 by RT PCR: POSITIVE — AB

## 2022-05-06 LAB — BRAIN NATRIURETIC PEPTIDE: B Natriuretic Peptide: 1274.7 pg/mL — ABNORMAL HIGH (ref 0.0–100.0)

## 2022-05-06 LAB — TROPONIN I (HIGH SENSITIVITY)
Troponin I (High Sensitivity): 29 ng/L — ABNORMAL HIGH (ref <18)
Troponin I (High Sensitivity): 30 ng/L — ABNORMAL HIGH (ref <18)

## 2022-05-06 MED ORDER — IOHEXOL 350 MG/ML SOLN
60.0000 mL | Freq: Once | INTRAVENOUS | Status: AC | PRN
  Administered 2022-05-06: 60 mL via INTRAVENOUS

## 2022-05-06 MED ORDER — ONDANSETRON HCL 4 MG/2ML IJ SOLN
4.0000 mg | Freq: Once | INTRAMUSCULAR | Status: AC
  Administered 2022-05-07: 4 mg via INTRAVENOUS
  Filled 2022-05-06: qty 2

## 2022-05-06 MED ORDER — ONDANSETRON HCL 4 MG/2ML IJ SOLN
4.0000 mg | Freq: Once | INTRAMUSCULAR | Status: AC
  Administered 2022-05-06: 4 mg via INTRAVENOUS
  Filled 2022-05-06: qty 2

## 2022-05-06 MED ORDER — FENTANYL CITRATE PF 50 MCG/ML IJ SOSY
50.0000 ug | PREFILLED_SYRINGE | Freq: Once | INTRAMUSCULAR | Status: AC
  Administered 2022-05-07: 50 ug via INTRAVENOUS
  Filled 2022-05-06: qty 1

## 2022-05-06 MED ORDER — ALBUTEROL SULFATE HFA 108 (90 BASE) MCG/ACT IN AERS
2.0000 | INHALATION_SPRAY | RESPIRATORY_TRACT | Status: DC | PRN
  Filled 2022-05-06: qty 6.7

## 2022-05-06 NOTE — ED Provider Notes (Signed)
[facility] Provider Note    Event Date/Time   First MD Initiated Contact with Patient 05/06/22 2312     (approximate)   History   Shortness of Breath   HPI  Sydney Gillespie is a 81 y.o. female with history of hyperlipidemia, hypothyroidism, multiple myeloma in remission, rheumatoid arthritis on Plaquenil who presents to the emergency department with complaints of cough, fevers, shortness of breath.  Hypoxic to 85% on room air at home.  Does not wear oxygen chronically.  No history of asthma, COPD, CHF.  Recent diagnosis of COVID-19.  Reports her cough is improved but still having fevers.  No history of PE or DVT.  Complaining of pain with deep inspiration in her chest and back.   History provided by patient and daughter.    Past Medical History:  Diagnosis Date   Allergic rhinitis    Allergy    Anemia    Anemia    Barrett's esophagus    Blood dyscrasia    multiple myloma remission   Cancer ([place])    Change in bowel habits    Compression fracture 2013   Degenerative disc disease, lumbar    Dysrhythmia    palpitations   Esophageal reflux    Family history of adverse reaction to anesthesia    nausea -mom   GERD (gastroesophageal reflux disease)    Gout    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[place])    Heart palpitations    Hyperlipidemia    Hypothyroidism    Inflammatory polyarthropathy (HCC)    Lumbago    Lumbar radiculitis    Lumbar stenosis with neurogenic claudication    Multiple myeloma (HCC)    Multiple myeloma ([place])    Osteopenia    Osteoporosis    Other dysphagia    Palpitations    Positive PPD    Renal insufficiency    Rheumatoid arthritis (HCC)    Rheumatoid arthritis (HCC)    Seasonal allergies    Vertigo     Past Surgical History:  Procedure Laterality Date   ABDOMINAL HYSTERECTOMY     APPENDECTOMY     BACK SURGERY     BREAST EXCISIONAL BIOPSY Bilateral    neg   CHOLECYSTECTOMY     COLONOSCOPY WITH PROPOFOL N/A  11/12/2016   Procedure: COLONOSCOPY WITH PROPOFOL;  Surgeon: Manya Silvas, MD;  Location: [facility] ENDOSCOPY;  Service: Endoscopy;  Laterality: N/A;   ESOPHAGOGASTRODUODENOSCOPY (EGD) WITH PROPOFOL N/A 11/12/2016   Procedure: ESOPHAGOGASTRODUODENOSCOPY (EGD) WITH PROPOFOL;  Surgeon: Manya Silvas, MD;  Location: [facility] ENDOSCOPY;  Service: Endoscopy;  Laterality: N/A;   FOOT SURGERY     LUMBAR LAMINECTOMY/DECOMPRESSION MICRODISCECTOMY Left 06/18/2016   Procedure: LAMINECTOMY FOR FACET/SYNOVIAL CYST LUMBAR THREE - LUMBAR FOUR LEFT;  Surgeon: Newman Pies, MD;  Location: [place];  Service: Neurosurgery;  Laterality: Left;  LAMINECTOMY FOR FACET/SYNOVIAL CYST LUMBAR THREE - LUMBAR FOUR LEFT    MEDICATIONS:  Prior to Admission medications   Medication Sig Start Date End Date Taking? Authorizing Provider  acetaminophen (TYLENOL) 325 MG tablet Take 650 mg by mouth every 6 (six) hours as needed for fever or headache.    [provider]  acyclovir (ZOVIRAX) 400 MG tablet Take 1 tablet (400 mg total) by mouth 2 (two) times daily. 04/10/22   Cammie Sickle, MD  albuterol (VENTOLIN HFA) 108 (90 Base) MCG/ACT inhaler Inhale 2 puffs into the lungs every 4 (four) hours as needed for wheezing or shortness of breath. 04/27/22  Sharen Hones, MD  allopurinol (ZYLOPRIM) 300 MG tablet Take 450 mg by mouth daily. 09/14/14   [provider]  ALPRAZolam (XANAX) 0.25 MG tablet TAKE 1 TABLET(0.25 MG) BY MOUTH AT BEDTIME AS NEEDED FOR ANXIETY 04/24/22   Cammie Sickle, MD  Ascorbic Acid (VITAMIN C) 1000 MG tablet Take 1,000 mg by mouth daily.    [provider]  atenolol (TENORMIN) 50 MG tablet Take 0.5 tablets (25 mg total) by mouth daily. Patient taking differently: Take 50 mg by mouth daily. 09/05/18   Ghimire, Henreitta Leber, MD  benzonatate (TESSALON) 200 MG capsule Take 1 capsule (200 mg total) by mouth 3 (three) times daily as needed for cough. 04/24/22   Verlon Au, NP  CALCIUM  MAGNESIUM ZINC PO Take 1 tablet by mouth in the morning, at noon, and at bedtime.    [provider]  cetirizine (ZYRTEC) 10 MG tablet Take 10 mg by mouth 2 (two) times daily.     [provider]  chlorpheniramine-HYDROcodone (TUSSIONEX) 10-8 MG/5ML Take 5 mLs by mouth every 12 (twelve) hours as needed (for cough unrelieved by other medications). May cause drowsiness. 04/24/22   Verlon Au, NP  docusate sodium (COLACE) 100 MG capsule Take 100 mg by mouth 2 (two) times daily.    [provider]  DULoxetine (CYMBALTA) 20 MG capsule Take 20 mg by mouth 2 (two) times daily. 10/02/20   [provider]  fluticasone (FLONASE) 50 MCG/ACT nasal spray Place 2 sprays into the nose daily.     [provider]  hydroxychloroquine (PLAQUENIL) 200 MG tablet Take 200 mg by mouth 2 (two) times daily. 10/25/18   [provider]  lenalidomide (REVLIMID) 10 MG capsule Take 1 capsule (10 mg total) by mouth daily. Take for 21 days, then hold for 7 days. Repeat every 28 days. Patient not taking: Reported on 04/23/2022 04/16/22   Cammie Sickle, MD  levothyroxine (SYNTHROID) 112 MCG tablet Take 112 mcg by mouth daily before breakfast. 01/16/22   [provider]  MYRBETRIQ 50 MG TB24 tablet Take 50 mg by mouth daily.    [provider]  oxybutynin (DITROPAN-XL) 10 MG 24 hr tablet Take 1 tablet (10 mg total) by mouth daily. 10/21/20   Bjorn Loser, MD  Potassium 99 MG TABS Take 1 tablet by mouth daily.    [provider]  rOPINIRole (REQUIP) 0.5 MG tablet Take 0.75 mg by mouth at bedtime as needed (Restless leg). 10/02/20   [provider]  vitamin B-12 (CYANOCOBALAMIN) 1000 MCG tablet Take 1,000 mcg by mouth daily. 01/03/20   [provider]    Physical Exam   Triage Vital Signs: ED Triage Vitals  Enc Vitals Group     BP 05/06/22 1445 (!) 151/79     Pulse Rate 05/06/22 1445 79     Resp 05/06/22 1445 18     Temp  05/06/22 1445 98.9 F (37.2 C)     Temp Source 05/06/22 1445 Oral     SpO2 05/06/22 1445 97 %     Weight 05/06/22 1444 153 lb (69.4 kg)     Height 05/06/22 1444 _0  (1.651 m)     Head Circumference --      Peak Flow --      Pain Score 05/06/22 1443 10     Pain Loc --      Pain Edu? --      Excl. in [place]? --     Most  recent vital signs: Vitals:   05/06/22 1917 05/06/22 2142  BP: (!) 160/68 (!) 151/71  Pulse: 79 83  Resp: 18 19  Temp: 98.4 F (36.9 C) 98.9 F (37.2 C)  SpO2: 96% 100%    CONSTITUTIONAL: Alert and oriented and responds appropriately to questions.  Elderly, appears uncomfortable HEAD: Normocephalic, atraumatic EYES: Conjunctivae clear, pupils appear equal, sclera nonicteric ENT: normal nose; moist mucous membranes NECK: Supple, normal ROM CARD: RRR; S1 and S2 appreciated; no murmurs, no clicks, no rubs, no gallops RESP: Patient appears uncomfortable with deep inspiration.  No tachypnea.  Breath sounds clear and equal bilaterally; no wheezes, no rhonchi, no rales, no hypoxia or respiratory distress, speaking full sentences ABD/GI: Normal bowel sounds; non-distended; soft, non-tender, no rebound, no guarding, no peritoneal signs BACK: The back appears normal; no spinal step-off or deformity EXT: Normal ROM in all joints; no deformity noted, no edema; no cyanosis, no calf tenderness or calf swelling SKIN: Normal color for age and race; warm; no rash on exposed skin NEURO: Moves all extremities equally, normal speech PSYCH: The patient's mood and manner are appropriate.   ED Results / Procedures / Treatments   LABS: (all labs ordered are listed, but only abnormal results are displayed) Labs Reviewed  RESP PANEL BY RT-PCR (RSV, FLU A&B, COVID)  [id number] - Abnormal; Notable for the following components:      Result Value   SARS Coronavirus 2 by RT PCR POSITIVE (*)    All other components within normal limits  CBC - Abnormal; Notable for the following components:    WBC 14.8 (*)    RBC 2.77 (*)    Hemoglobin 9.0 (*)    HCT 28.8 (*)    MCV 104.0 (*)    RDW 15.7 (*)    All other components within normal limits  COMPREHENSIVE METABOLIC PANEL - Abnormal; Notable for the following components:   Sodium 134 (*)    Glucose, Bld 102 (*)    BUN 40 (*)    Creatinine, Ser 1.45 (*)    Albumin 3.2 (*)    AST 45 (*)    GFR, Estimated 36 (*)    All other components within normal limits  BRAIN NATRIURETIC PEPTIDE - Abnormal; Notable for the following components:   B Natriuretic Peptide 1,274.7 (*)    All other components within normal limits  TROPONIN I (HIGH SENSITIVITY) - Abnormal; Notable for the following components:   Troponin I (High Sensitivity) 29 (*)    All other components within normal limits  TROPONIN I (HIGH SENSITIVITY) - Abnormal; Notable for the following components:   Troponin I (High Sensitivity) 30 (*)    All other components within normal limits  PROCALCITONIN  CBC  CREATININE, SERUM     EKG:  EKG Interpretation  Date/Time:  Wednesday May 06 2022 14:47:10 EST Ventricular Rate:  82 PR Interval:  176 QRS Duration: 84 QT Interval:  428 QTC Calculation: 500 R Axis:   -29 Text Interpretation: Normal sinus rhythm Moderate voltage criteria for LVH, may be normal variant ( R in aVL , Cornell product ) Prolonged QT Abnormal ECG When compared with ECG of 25-Apr-2022 06:51, No significant change was found Confirmed by UNCONFIRMED, DOCTOR ([id number]), editor Dwaine Deter ([id number]) on 05/06/2022 3:04:19 PM         RADIOLOGY: My personal review and interpretation of imaging:  Ct shows no PE.  Patient does have right-sided pneumonia.  Also has an acute T9 compression fracture.  I have personally reviewed  all radiology reports.   CT Thoracic Spine Wo Contrast  Result Date: 05/07/2022 CLINICAL DATA:  Spine fracture, thoracic, traumatic EXAM: CT THORACIC SPINE WITHOUT CONTRAST TECHNIQUE: Multidetector CT images of the thoracic were  obtained using the standard protocol without intravenous contrast. RADIATION DOSE REDUCTION: This exam was performed according to the departmental dose-optimization program which includes automated exposure control, adjustment of the mA and/or kV according to patient size and/or use of iterative reconstruction technique. COMPARISON:  CT chest 04/25/2022 FINDINGS: Alignment: Normal thoracic kyphosis. Mild thoracic dextroscoliosis, apex right at T5. No listhesis. Vertebrae: There is an acute compression deformity of T9 with approximately 40% loss of height. No retropulsion. Posterior elements appear intact. Stable remote compression deformities of T7 and T12 with mild loss of height. Osseous structures are diffusely osteopenic. No lytic or blastic bone lesion. Paraspinal and other soft tissues: Paraspinal infiltration is seen at T8-9 in keeping with paraspinal edema or interstitial hemorrhage. No loculated paraspinal fluid collection identified. No canal hematoma. Additional thoracic findings are better described on accompanying CT examination of the chest. Disc levels: Endplate remodeling at G0-F7 and vacuum disc phenomena at T11-12 is present in keeping with changes of mild degenerative disc disease. Intervertebral disc heights are preserved. No significant canal stenosis. No significant neuroforaminal narrowing. IMPRESSION: 1. Acute compression deformity of T9 with approximately 40% loss of height. No retropulsion. No listhesis. Posterior elements appear intact. 2. Stable remote compression deformities of T7 and T12 with mild loss of height. 3. Additional thoracic findings are better described on accompanying CT examination of the chest. Electronically Signed   By: Fidela Salisbury M.D.   On: 05/07/2022 00:21   CT Angio Chest PE W and/or Wo Contrast  Result Date: 05/07/2022 CLINICAL DATA:  Pulmonary embolism (PE) suspected, high prob. Dyspnea, fever, cough EXAM: CT ANGIOGRAPHY CHEST WITH CONTRAST TECHNIQUE:  Multidetector CT imaging of the chest was performed using the standard protocol during bolus administration of intravenous contrast. Multiplanar CT image reconstructions and MIPs were obtained to evaluate the vascular anatomy. RADIATION DOSE REDUCTION: This exam was performed according to the departmental dose-optimization program which includes automated exposure control, adjustment of the mA and/or kV according to patient size and/or use of iterative reconstruction technique. CONTRAST:  45m OMNIPAQUE IOHEXOL 350 MG/ML SOLN COMPARISON:  04/25/2022 FINDINGS: Cardiovascular: There is adequate opacification of the pulmonary arterial tree. No intraluminal filling defect identified to suggest acute pulmonary embolism. Central pulmonary arteries are enlarged in keeping with changes of pulmonary arterial hypertension. No significant coronary artery calcification. Mild global cardiomegaly. No pericardial effusion. Mild atherosclerotic calcification within the thoracic aorta. No aortic aneurysm. Mediastinum/Nodes: No enlarged mediastinal, hilar, or axillary lymph nodes. Thyroid gland, trachea, and esophagus demonstrate no significant findings. Lungs/Pleura: Focal consolidation has developed within the right lower lobe since prior examination in keeping with changes of acute lobar pneumonia in the appropriate clinical setting. Scattered nodular sparse infiltrate is also noted within the right middle lobe and right upper lobe. Subsegmental atelectasis within the lingula. No pneumothorax or pleural effusion. Upper Abdomen: No acute abnormality. Musculoskeletal: Interval development of an acute compression deformity of T9 with approximately 40% loss of height. No listhesis. No retropulsion. Posterior elements of T9 appear intact. Stable remote superior endplate fractures of T7 and T12. Mild paravertebral infiltration at T12 in keeping with mild paraspinal edema or interstitial hemorrhage. No lytic or blastic bone lesion.  Review of the MIP images confirms the above findings. IMPRESSION: 1. No pulmonary embolism. 2. Interval development of acute lobar pneumonia within  the right lower lobe. Scattered sparse infiltrates also noted within the right middle and upper lobe 3. Interval development of an acute compression deformity of T9 with approximately 40% loss of height. No listhesis. No retropulsion. 4. Stable remote superior endplate fractures of T7 and T12. 5. Mild global cardiomegaly. Morphologic changes in keeping with pulmonary arterial hypertension. Aortic Atherosclerosis (ICD10-I70.0). Electronically Signed   By: Fidela Salisbury M.D.   On: 05/07/2022 00:11   DG Chest 2 View  Result Date: 05/06/2022 CLINICAL DATA:  sob EXAM: CHEST - 2 VIEW COMPARISON:  Chest x-ray 04/25/2022. FINDINGS: Since CT chest on 04/25/2022, probable acute/interval compression fracture of a lower thoracic vertebral body. Mild streaky bibasilar opacities. No confluent consolidation. No definite pleural effusions or pneumothorax. Cervical and lumbar fusion hardware, partially imaged. Cholecystectomy clips. IMPRESSION: 1. Since CT chest on 04/25/2022, probable recent/interval compression fracture of a lower thoracic vertebral body. Recommend dedicated cross-sectional imaging to confirm and further evaluate. 2. Mild streaky bibasilar opacities, favor atelectasis/scar. Electronically Signed   By: Margaretha Sheffield M.D.   On: 05/06/2022 15:17     PROCEDURES:  Critical Care performed: Yes, see critical care procedure note(s)   CRITICAL CARE Performed by: Cyril Mourning Raylinn Kosar   Total critical care time: 40 minutes  Critical care time was exclusive of separately billable procedures and treating other patients.  Critical care was necessary to treat or prevent imminent or life-threatening deterioration.  Critical care was time spent personally by me on the following activities: development of treatment plan with patient and/or surrogate as well as  nursing, discussions with consultants, evaluation of patient's response to treatment, examination of patient, obtaining history from patient or surrogate, ordering and performing treatments and interventions, ordering and review of laboratory studies, ordering and review of radiographic studies, pulse oximetry and re-evaluation of patient's condition.   Marland Kitchen1-3 Lead EKG Interpretation  Performed by: Josanne Boerema, Delice Bison, DO Authorized by: Lashanda Storlie, Delice Bison, DO     Interpretation: normal     ECG rate:  83   ECG rate assessment: normal     Rhythm: sinus rhythm     Ectopy: none     Conduction: normal       IMPRESSION / MDM / ASSESSMENT AND PLAN / ED COURSE  I reviewed the triage vital signs and the nursing notes.    Patient here with recent diet of COVID-19 with continued cough, fevers and now hypoxia.  The patient is on the cardiac monitor to evaluate for evidence of arrhythmia and/or significant heart rate changes.   DIFFERENTIAL DIAGNOSIS (includes but not limited to):   COVID, viral pneumonia, flu, RSV, bacterial pneumonia, CHF, PE, pneumothorax   Patient's presentation is most consistent with acute presentation with potential threat to life or bodily function.   PLAN: Workup initiated from triage.  Patient has leukocytosis of 14,000.  Chronic and stable anemia.  Improved renal function compared to 9 days ago.  Troponin slightly elevated but stable.  BNP is greater than 1200.  She denies any history of CHF and chest x-ray when reviewed and interpreted by myself and the radiologist shows no edema, infiltrate.  This makes me more concerned that she could have a PE.  Will obtain CTA of the chest.  X-ray also showed that she had a possible lower thoracic compression fracture.  It was not seen on CT of the chest on 04/25/2022.  Will also obtain CT of the thoracic spine.  Will give pain medication.  Discussed with family that patient will need admission  given new oxygen  requirement.   MEDICATIONS GIVEN IN ED: Medications  albuterol (VENTOLIN HFA) 108 (90 Base) MCG/ACT inhaler 2 puff (has no administration in time range)  hydroxychloroquine (PLAQUENIL) tablet 200 mg (has no administration in time range)  atenolol (TENORMIN) tablet 50 mg (has no administration in time range)  levothyroxine (SYNTHROID) tablet 112 mcg (has no administration in time range)  chlorpheniramine-HYDROcodone (TUSSIONEX) 10-8 MG/5ML suspension 5 mL (has no administration in time range)  enoxaparin (LOVENOX) injection 30 mg (has no administration in time range)  acetaminophen (TYLENOL) tablet 650 mg (has no administration in time range)    Or  acetaminophen (TYLENOL) suppository 650 mg (has no administration in time range)  ondansetron (ZOFRAN) tablet 4 mg (has no administration in time range)    Or  ondansetron (ZOFRAN) injection 4 mg (has no administration in time range)  cefTRIAXone (ROCEPHIN) 2 g in sodium chloride 0.9 % 100 mL IVPB (0 g Intravenous Stopped 05/07/22 0156)  azithromycin (ZITHROMAX) 500 mg in sodium chloride 0.9 % 250 mL IVPB (500 mg Intravenous New Bag/Given 05/07/22 0143)  methylPREDNISolone sodium succinate (SOLU-MEDROL) 40 mg/mL injection 40 mg (40 mg Intravenous Given 05/07/22 0143)    Followed by  predniSONE (DELTASONE) tablet 40 mg (has no administration in time range)  ipratropium-albuterol (DUONEB) 0.5-2.5 (3) MG/3ML nebulizer solution 3 mL (3 mLs Nebulization Given 05/07/22 0144)  albuterol (PROVENTIL) (2.5 MG/3ML) 0.083% nebulizer solution 2.5 mg (has no administration in time range)  furosemide (LASIX) injection 20 mg (20 mg Intravenous Given 05/07/22 0143)  HYDROcodone-acetaminophen (NORCO/VICODIN) 5-325 MG per tablet 1-2 tablet (has no administration in time range)  morphine (PF) 2 MG/ML injection 2 mg (has no administration in time range)  guaiFENesin (MUCINEX) 12 hr tablet 600 mg (600 mg Oral Given 05/07/22 0144)  ceFEPIme (MAXIPIME) 2 g in sodium  chloride 0.9 % 100 mL IVPB (has no administration in time range)  ondansetron (ZOFRAN) injection 4 mg (4 mg Intravenous Given 05/06/22 1449)  fentaNYL (SUBLIMAZE) injection 50 mcg (50 mcg Intravenous Given 05/07/22 0003)  ondansetron (ZOFRAN) injection 4 mg (4 mg Intravenous Given 05/07/22 0004)  iohexol (OMNIPAQUE) 350 MG/ML injection 60 mL (60 mLs Intravenous Contrast Given 05/06/22 2345)     ED COURSE: CT scan reviewed and interpreted by myself and the radiologist and shows no PE but does show right lower lobe, middle lobe and upper lobe pneumonia.  Procalcitonin has come back elevated at 0.47.  Will cover with antibiotics for bacterial pneumonia.  She also has a T9 compression fracture.  Will place in TLSO brace.  Neurosurgery may need to be consulted not emergently.   CONSULTS:  Consulted and discussed patient's case with hospitalist, Dr. Damita Dunnings.  I have recommended admission and consulting physician agrees and will place admission orders.  Patient (and family if present) agree with this plan.   I reviewed all nursing notes, vitals, pertinent previous records.  All labs, EKGs, imaging ordered have been independently reviewed and interpreted by myself.    OUTSIDE RECORDS REVIEWED: Reviewed patient's last admission for acute respiratory failure with hypoxia secondary to COVID-19 on 04/25/2022.       FINAL CLINICAL IMPRESSION(S) / ED DIAGNOSES   Final diagnoses:  Acute respiratory failure with hypoxia (HCC)  Pneumonia of right lower lobe due to infectious organism  Closed wedge compression fracture of T9 vertebra, initial encounter ([place])     Rx / DC Orders   ED Discharge Orders     None  Note:  This document was prepared using Dragon voice recognition software and may include unintentional dictation errors.   Matsue Strom, Delice Bison, DO 05/07/22 [phone]

## 2022-05-06 NOTE — ED Triage Notes (Signed)
First Nurse NOte:  Arrives from home via [facility]. REcent history of COVID and flu.  Arrives today c/o weakness, unable to ambulate on her own.  Also c/o SOB.  Per EMS report, initial RA sat was 85%.  Patient on 2l/ [place] 98%  HR:  87.  158/70.    116.  T:  100.4

## 2022-05-06 NOTE — ED Triage Notes (Signed)
Pt here with SOB. Pt recently had covid and flu, pt also has multiple myeloma. Pt does not wear oxygen at home, here on 2L via ems. Pt also has a consistent cough.

## 2022-05-06 NOTE — ED Provider Triage Note (Signed)
Emergency Medicine Provider Triage Evaluation Note  JOVANI COLQUHOUN , a 81 y.o. female  was evaluated in triage.  Pt complains of SOB with fever, weakness and cough. Patient states that EMS told her she had a temp of 104.  Recent history of Covid and Flu.  Review of Systems  Positive: + home O2, + multiple myeloma and pneumonia Negative:   Physical Exam  BP (!) 151/79   Pulse 79   Temp 98.9 F (37.2 C) (Oral)   Resp 18   Ht _0  (1.651 m)   Wt 69.4 kg   SpO2 97%   BMI 25.46 kg/m  Gen:   Awake, no distress  nasal cannula +  Resp:  Normal effort occasional wheeze, course cough, diminished breath sounds.   MSK:   Moves extremities without difficulty  Other:    Medical Decision Making  Medically screening exam initiated at 2:49 PM.  Appropriate orders placed.  MELORA MENON was informed that the remainder of the evaluation will be completed by another provider, this initial triage assessment does not replace that evaluation, and the importance of remaining in the ED until their evaluation is complete.     Johnn Hai, PA-C 05/06/22 1458

## 2022-05-06 NOTE — ED Notes (Signed)
Assisted pt to bathroom x 1 assist.  O2 tank changed out to a full one

## 2022-05-06 NOTE — Telephone Encounter (Signed)
Patient son called to report that patient is not doing well after her discharge from hospital for [place]. I asked if she has contacted her PCP and he put her on the phone. She states that she has not spoken to her PCP and that she has an appointment tomorrow with Dr B that she does not feel she can mae and wanted it cancelled. She told me that she has been home for 3 days and she had been offer ed to to go to SNF or home and she did not want to go top SNF. She states she is more shortness of breath coughing up grteen and black sputum and is weaker. I strongly advised that she contact her PCP to let them know what is going on and that he may even want her to go to ER since she is worse. She did not agree or disagree with this. She said she wanted Dr B to know what is going on. 05/07/22 appts cancelled

## 2022-05-07 ENCOUNTER — Inpatient Hospital Stay: Payer: Medicare Other | Admitting: Internal Medicine

## 2022-05-07 ENCOUNTER — Inpatient Hospital Stay: Payer: Medicare Other

## 2022-05-07 ENCOUNTER — Encounter: Payer: Self-pay | Admitting: Internal Medicine

## 2022-05-07 ENCOUNTER — Ambulatory Visit: Payer: Medicare Other

## 2022-05-07 ENCOUNTER — Inpatient Hospital Stay
Admit: 2022-05-07 | Discharge: 2022-05-07 | Disposition: A | Discharge status: Home / Self Care | Payer: Medicare Other | Attending: Internal Medicine | Admitting: Internal Medicine

## 2022-05-07 DIAGNOSIS — M069 Rheumatoid arthritis, unspecified: Secondary | ICD-10-CM | POA: Diagnosis present

## 2022-05-07 DIAGNOSIS — D849 Immunodeficiency, unspecified: Secondary | ICD-10-CM | POA: Diagnosis present

## 2022-05-07 DIAGNOSIS — I5033 Acute on chronic diastolic (congestive) heart failure: Secondary | ICD-10-CM | POA: Diagnosis present

## 2022-05-07 DIAGNOSIS — N184 Chronic kidney disease, stage 4 (severe): Secondary | ICD-10-CM

## 2022-05-07 DIAGNOSIS — J189 Pneumonia, unspecified organism: Secondary | ICD-10-CM

## 2022-05-07 DIAGNOSIS — E872 Acidosis, unspecified: Secondary | ICD-10-CM | POA: Diagnosis present

## 2022-05-07 DIAGNOSIS — S22000A Wedge compression fracture of unspecified thoracic vertebra, initial encounter for closed fracture: Secondary | ICD-10-CM | POA: Insufficient documentation

## 2022-05-07 DIAGNOSIS — I082 Rheumatic disorders of both aortic and tricuspid valves: Secondary | ICD-10-CM | POA: Diagnosis present

## 2022-05-07 DIAGNOSIS — M4854XA Collapsed vertebra, not elsewhere classified, thoracic region, initial encounter for fracture: Secondary | ICD-10-CM | POA: Diagnosis present

## 2022-05-07 DIAGNOSIS — U071 COVID-19: Secondary | ICD-10-CM

## 2022-05-07 DIAGNOSIS — R7989 Other specified abnormal findings of blood chemistry: Secondary | ICD-10-CM

## 2022-05-07 DIAGNOSIS — J9601 Acute respiratory failure with hypoxia: Secondary | ICD-10-CM | POA: Diagnosis present

## 2022-05-07 DIAGNOSIS — E8809 Other disorders of plasma-protein metabolism, not elsewhere classified: Secondary | ICD-10-CM | POA: Diagnosis present

## 2022-05-07 DIAGNOSIS — C9000 Multiple myeloma not having achieved remission: Secondary | ICD-10-CM | POA: Diagnosis present

## 2022-05-07 DIAGNOSIS — E871 Hypo-osmolality and hyponatremia: Secondary | ICD-10-CM | POA: Diagnosis present

## 2022-05-07 DIAGNOSIS — I2489 Other forms of acute ischemic heart disease: Secondary | ICD-10-CM | POA: Diagnosis present

## 2022-05-07 DIAGNOSIS — E663 Overweight: Secondary | ICD-10-CM | POA: Diagnosis present

## 2022-05-07 DIAGNOSIS — J181 Lobar pneumonia, unspecified organism: Secondary | ICD-10-CM | POA: Diagnosis present

## 2022-05-07 DIAGNOSIS — F32A Depression, unspecified: Secondary | ICD-10-CM | POA: Diagnosis present

## 2022-05-07 DIAGNOSIS — D539 Nutritional anemia, unspecified: Secondary | ICD-10-CM | POA: Diagnosis present

## 2022-05-07 DIAGNOSIS — I2721 Secondary pulmonary arterial hypertension: Secondary | ICD-10-CM | POA: Diagnosis present

## 2022-05-07 DIAGNOSIS — D631 Anemia in chronic kidney disease: Secondary | ICD-10-CM

## 2022-05-07 DIAGNOSIS — E039 Hypothyroidism, unspecified: Secondary | ICD-10-CM | POA: Diagnosis present

## 2022-05-07 DIAGNOSIS — I82461 Acute embolism and thrombosis of right calf muscular vein: Secondary | ICD-10-CM | POA: Diagnosis not present

## 2022-05-07 DIAGNOSIS — Z9481 Bone marrow transplant status: Secondary | ICD-10-CM | POA: Diagnosis not present

## 2022-05-07 DIAGNOSIS — M064 Inflammatory polyarthropathy: Secondary | ICD-10-CM | POA: Diagnosis present

## 2022-05-07 HISTORY — DX: Acute respiratory failure with hypoxia: J96.01

## 2022-05-07 LAB — COMPREHENSIVE METABOLIC PANEL
ALT: 21 U/L (ref 0–44)
AST: 49 U/L — ABNORMAL HIGH (ref 15–41)
Albumin: 2.8 g/dL — ABNORMAL LOW (ref 3.5–5.0)
Alkaline Phosphatase: 62 U/L (ref 38–126)
Anion gap: 12 (ref 5–15)
BUN: 45 mg/dL — ABNORMAL HIGH (ref 8–23)
CO2: 21 mmol/L — ABNORMAL LOW (ref 22–32)
Calcium: 8.6 mg/dL — ABNORMAL LOW (ref 8.9–10.3)
Chloride: 101 mmol/L (ref 98–111)
Creatinine, Ser: 1.6 mg/dL — ABNORMAL HIGH (ref 0.44–1.00)
GFR, Estimated: 32 mL/min — ABNORMAL LOW (ref >60)
Glucose, Bld: 105 mg/dL — ABNORMAL HIGH (ref 70–99)
Potassium: 4.8 mmol/L (ref 3.5–5.1)
Sodium: 134 mmol/L — ABNORMAL LOW (ref 135–145)
Total Bilirubin: 1.1 mg/dL (ref 0.3–1.2)
Total Protein: 6.1 g/dL — ABNORMAL LOW (ref 6.5–8.1)

## 2022-05-07 LAB — CBC WITH DIFFERENTIAL/PLATELET
Abs Immature Granulocytes: 0.12 10*3/uL — ABNORMAL HIGH (ref 0.00–0.07)
Basophils Absolute: 0 10*3/uL (ref 0.0–0.1)
Basophils Relative: 0 %
Eosinophils Absolute: 0 10*3/uL (ref 0.0–0.5)
Eosinophils Relative: 0 %
HCT: 25.5 % — ABNORMAL LOW (ref 36.0–46.0)
Hemoglobin: 8 g/dL — ABNORMAL LOW (ref 12.0–15.0)
Immature Granulocytes: 1 %
Lymphocytes Relative: 1 %
Lymphs Abs: 0.2 10*3/uL — ABNORMAL LOW (ref 0.7–4.0)
MCH: 32.4 pg (ref 26.0–34.0)
MCHC: 31.4 g/dL (ref 30.0–36.0)
MCV: 103.2 fL — ABNORMAL HIGH (ref 80.0–100.0)
Monocytes Absolute: 0.2 10*3/uL (ref 0.1–1.0)
Monocytes Relative: 1 %
Neutro Abs: 16.2 10*3/uL — ABNORMAL HIGH (ref 1.7–7.7)
Neutrophils Relative %: 97 %
Platelets: 190 10*3/uL (ref 150–400)
RBC: 2.47 MIL/uL — ABNORMAL LOW (ref 3.87–5.11)
RDW: 15.7 % — ABNORMAL HIGH (ref 11.5–15.5)
WBC: 16.7 10*3/uL — ABNORMAL HIGH (ref 4.0–10.5)
nRBC: 0 % (ref 0.0–0.2)

## 2022-05-07 LAB — ECHOCARDIOGRAM COMPLETE
AR max vel: 2.41 cm2
AV Area VTI: 2.99 cm2
AV Area mean vel: 2.27 cm2
AV Mean grad: 4.5 mmHg
AV Peak grad: 7.8 mmHg
Ao pk vel: 1.4 m/s
Area-P 1/2: 4.89 cm2
Height: 65 in
P 1/2 time: 445 msec
S' Lateral: 2.7 cm
Weight: 2447.99 oz

## 2022-05-07 LAB — PHOSPHORUS: Phosphorus: 5 mg/dL — ABNORMAL HIGH (ref 2.5–4.6)

## 2022-05-07 LAB — PROCALCITONIN: Procalcitonin: 0.47 ng/mL

## 2022-05-07 LAB — MAGNESIUM: Magnesium: 2.1 mg/dL (ref 1.7–2.4)

## 2022-05-07 MED ORDER — DULOXETINE HCL 20 MG PO CPEP
20.0000 mg | ORAL_CAPSULE | Freq: Two times a day (BID) | ORAL | Status: DC
  Administered 2022-05-08 – 2022-05-10 (×5): 20 mg via ORAL
  Filled 2022-05-07 (×6): qty 1

## 2022-05-07 MED ORDER — SODIUM CHLORIDE 0.9 % IV SOLN
2.0000 g | INTRAVENOUS | Status: DC
  Administered 2022-05-07 – 2022-05-09 (×4): 2 g via INTRAVENOUS
  Filled 2022-05-07 (×4): qty 20

## 2022-05-07 MED ORDER — ALPRAZOLAM 0.25 MG PO TABS: 0.2500 mg | ORAL_TABLET | Freq: Every evening | ORAL | Status: DC | PRN

## 2022-05-07 MED ORDER — FUROSEMIDE 10 MG/ML IJ SOLN
20.0000 mg | Freq: Two times a day (BID) | INTRAMUSCULAR | Status: DC
  Administered 2022-05-07: 20 mg via INTRAVENOUS
  Filled 2022-05-07: qty 4

## 2022-05-07 MED ORDER — MORPHINE SULFATE (PF) 2 MG/ML IV SOLN
2.0000 mg | INTRAVENOUS | Status: DC | PRN
  Administered 2022-05-07: 2 mg via INTRAVENOUS
  Filled 2022-05-07: qty 1

## 2022-05-07 MED ORDER — PREDNISONE 20 MG PO TABS
40.0000 mg | ORAL_TABLET | Freq: Every day | ORAL | Status: DC
  Administered 2022-05-08: 40 mg via ORAL
  Filled 2022-05-07: qty 2

## 2022-05-07 MED ORDER — ENOXAPARIN SODIUM 30 MG/0.3ML IJ SOSY
30.0000 mg | PREFILLED_SYRINGE | INTRAMUSCULAR | Status: DC
  Administered 2022-05-07 – 2022-05-08 (×2): 30 mg via SUBCUTANEOUS
  Filled 2022-05-07 (×2): qty 0.3

## 2022-05-07 MED ORDER — OXYBUTYNIN CHLORIDE ER 10 MG PO TB24
10.0000 mg | ORAL_TABLET | Freq: Every day | ORAL | Status: DC
  Administered 2022-05-07 – 2022-05-10 (×4): 10 mg via ORAL
  Filled 2022-05-07 (×5): qty 1

## 2022-05-07 MED ORDER — ONDANSETRON HCL 4 MG PO TABS: 4.0000 mg | ORAL_TABLET | Freq: Four times a day (QID) | ORAL | Status: DC | PRN

## 2022-05-07 MED ORDER — ALBUTEROL SULFATE (2.5 MG/3ML) 0.083% IN NEBU: 2.5000 mg | INHALATION_SOLUTION | RESPIRATORY_TRACT | Status: DC | PRN

## 2022-05-07 MED ORDER — HYDROXYCHLOROQUINE SULFATE 200 MG PO TABS
200.0000 mg | ORAL_TABLET | ORAL | Status: DC
  Administered 2022-05-09 – 2022-05-10 (×2): 200 mg via ORAL
  Filled 2022-05-07 (×3): qty 1

## 2022-05-07 MED ORDER — SODIUM CHLORIDE 0.9 % IV SOLN
500.0000 mg | INTRAVENOUS | Status: DC
  Administered 2022-05-07 – 2022-05-10 (×4): 500 mg via INTRAVENOUS
  Filled 2022-05-07 (×4): qty 5

## 2022-05-07 MED ORDER — TIZANIDINE HCL 2 MG PO TABS
2.0000 mg | ORAL_TABLET | Freq: Every day | ORAL | Status: DC
  Administered 2022-05-08 – 2022-05-09 (×2): 2 mg via ORAL
  Filled 2022-05-07 (×2): qty 1

## 2022-05-07 MED ORDER — HYDROCOD POLI-CHLORPHE POLI ER 10-8 MG/5ML PO SUER: 5.0000 mL | Freq: Two times a day (BID) | ORAL | Status: DC | PRN

## 2022-05-07 MED ORDER — HYDROCODONE-ACETAMINOPHEN 5-325 MG PO TABS
1.0000 | ORAL_TABLET | ORAL | Status: DC | PRN
  Administered 2022-05-08: 2 via ORAL
  Administered 2022-05-08: 1 via ORAL
  Administered 2022-05-09 – 2022-05-10 (×2): 2 via ORAL
  Filled 2022-05-07: qty 2
  Filled 2022-05-07: qty 1
  Filled 2022-05-07 (×3): qty 2

## 2022-05-07 MED ORDER — ONDANSETRON HCL 4 MG/2ML IJ SOLN: 4.0000 mg | Freq: Four times a day (QID) | INTRAMUSCULAR | Status: DC | PRN

## 2022-05-07 MED ORDER — MIRABEGRON ER 50 MG PO TB24
50.0000 mg | ORAL_TABLET | Freq: Every day | ORAL | Status: DC
  Administered 2022-05-07 – 2022-05-10 (×4): 50 mg via ORAL
  Filled 2022-05-07 (×5): qty 1

## 2022-05-07 MED ORDER — HYDROXYCHLOROQUINE SULFATE 200 MG PO TABS
200.0000 mg | ORAL_TABLET | Freq: Two times a day (BID) | ORAL | Status: DC
  Administered 2022-05-07: 200 mg via ORAL
  Filled 2022-05-07: qty 1

## 2022-05-07 MED ORDER — ACETAMINOPHEN 650 MG RE SUPP: 650.0000 mg | Freq: Four times a day (QID) | RECTAL | Status: DC | PRN

## 2022-05-07 MED ORDER — SODIUM CHLORIDE 0.9 % IV SOLN
2.0000 g | Freq: Once | INTRAVENOUS | Status: AC
  Administered 2022-05-07: 2 g via INTRAVENOUS
  Filled 2022-05-07: qty 12.5

## 2022-05-07 MED ORDER — IPRATROPIUM-ALBUTEROL 0.5-2.5 (3) MG/3ML IN SOLN
3.0000 mL | Freq: Four times a day (QID) | RESPIRATORY_TRACT | Status: DC
  Administered 2022-05-07 – 2022-05-08 (×6): 3 mL via RESPIRATORY_TRACT
  Filled 2022-05-07 (×6): qty 3

## 2022-05-07 MED ORDER — FUROSEMIDE 10 MG/ML IJ SOLN
20.0000 mg | Freq: Two times a day (BID) | INTRAMUSCULAR | Status: DC
  Administered 2022-05-07 – 2022-05-09 (×6): 20 mg via INTRAVENOUS
  Filled 2022-05-07: qty 2
  Filled 2022-05-07 (×2): qty 4
  Filled 2022-05-07: qty 2
  Filled 2022-05-07: qty 4
  Filled 2022-05-07: qty 2

## 2022-05-07 MED ORDER — HYDROXYCHLOROQUINE SULFATE 200 MG PO TABS
200.0000 mg | ORAL_TABLET | ORAL | Status: DC
  Administered 2022-05-08 (×2): 200 mg via ORAL
  Filled 2022-05-07 (×3): qty 1

## 2022-05-07 MED ORDER — ROPINIROLE HCL 0.25 MG PO TABS: 0.7500 mg | ORAL_TABLET | Freq: Every evening | ORAL | Status: DC | PRN

## 2022-05-07 MED ORDER — PANTOPRAZOLE SODIUM 40 MG PO TBEC
40.0000 mg | DELAYED_RELEASE_TABLET | Freq: Two times a day (BID) | ORAL | Status: DC
  Administered 2022-05-07 – 2022-05-10 (×6): 40 mg via ORAL
  Filled 2022-05-07 (×6): qty 1

## 2022-05-07 MED ORDER — ATENOLOL 50 MG PO TABS
50.0000 mg | ORAL_TABLET | Freq: Every day | ORAL | Status: DC
  Administered 2022-05-07 – 2022-05-10 (×4): 50 mg via ORAL
  Filled 2022-05-07 (×2): qty 2
  Filled 2022-05-07 (×3): qty 1

## 2022-05-07 MED ORDER — LEVOTHYROXINE SODIUM 112 MCG PO TABS
112.0000 ug | ORAL_TABLET | Freq: Every day | ORAL | Status: DC
  Administered 2022-05-08 – 2022-05-10 (×3): 112 ug via ORAL
  Filled 2022-05-07 (×4): qty 1

## 2022-05-07 MED ORDER — GUAIFENESIN ER 600 MG PO TB12
600.0000 mg | ORAL_TABLET | Freq: Two times a day (BID) | ORAL | Status: DC
  Administered 2022-05-07 – 2022-05-10 (×7): 600 mg via ORAL
  Filled 2022-05-07 (×6): qty 1

## 2022-05-07 MED ORDER — ACETAMINOPHEN 325 MG PO TABS: 650.0000 mg | ORAL_TABLET | Freq: Four times a day (QID) | ORAL | Status: DC | PRN

## 2022-05-07 MED ORDER — METHYLPREDNISOLONE SODIUM SUCC 40 MG IJ SOLR
40.0000 mg | Freq: Two times a day (BID) | INTRAMUSCULAR | Status: AC
  Administered 2022-05-07 (×2): 40 mg via INTRAVENOUS
  Filled 2022-05-07 (×2): qty 1

## 2022-05-07 NOTE — Consult Note (Signed)
[facility] NOTE       Patient ID: Sydney Gillespie MRN: [id number] DOB/AGE: 1940/07/23 81 y.o.  Admit date: 05/06/2022 Referring Physician Dr. Judd Gaudier  Primary Physician Dr. Ginette Pitman Primary Cardiologist Dr. Saralyn Pilar (last seen in 2021)  Reason for Consultation ?CHF  HPI: Sydney Gillespie "Sydney Gillespie" is an 81yoF with a PMH of multiple myeloma (dx 2012 s/p stem cell transplant, relapse 2021, ongoing tx), CKD 4, RA, palpitations, who presented to [facility] ED 05/06/2022 with cough, fever, and shortness of breath.  Hypoxic to 85% on room air.  She tested positive for COVID-19 on 12/14 has not really felt better since then.  She is being treated for bacterial pneumonia, cardiology is consulted out of concern for new onset heart failure (BNP 1274).  Patient states a couple weeks ago she had just returned home from a trip to the beach and was "feeling pretty great", and then the following day she had an appointment with her oncologist and had an episode of vomiting, felt somewhat weak which was not particularly unusual for her and tested positive for COVID on 12/14.  Ever since then, she has had a significant dry cough and profound weakness that is likely getting worse.  She has pleuritic chest discomfort and a productive cough with a current oxygen requirement of 2 L with not at baseline.  She has occasional palpitations that are well-controlled on her home atenolol, and otherwise denies orthopnea or PND. Has occasional swelling of her right foot but denies abdominal distention.  She was previously followed by Dr. Saralyn Pilar for vasovagal syncope and palpitations, ultimately here syncope was attributed to "nerve medication" and she has not had issues with syncope/presyncope since that medication was stopped.  Time of evaluation she did not upright in bed, comfortably sleeping but awakens easily to voice, states she feels much better compared to when she presented primarily in terms of  her cough, respiratory effort, and upper back pain.  In the emergency department she was started on azithromycin and ceftriaxone, and IV Solu-Medrol, and also given a dose of IV Lasix 20 mg x 1.  Her CTA chest was negative for PE but did reveal right lower lobe pneumonia with scattered infiltrates on the right middle and upper lobe, with an acute compression deformity of T9.  Labs notable for leukocytosis to 14,800, H&H with interval drift overnight from 9.0/28.8-8.0/25.5 today.  Renal function around baseline with BUN/creatinine 45/1.6 and GFR 32.  BNP was elevated at 1274.  High-sensitivity troponin with a flat trend at 29, 30.   Review of systems complete and found to be negative unless listed above     Past Medical History:  Diagnosis Date   Allergic rhinitis    Allergy    Anemia    Anemia    Barrett's esophagus    Blood dyscrasia    multiple myloma remission   Cancer (HCC)    Change in bowel habits    Compression fracture 2013   Degenerative disc disease, lumbar    Dysrhythmia    palpitations   Esophageal reflux    Family history of adverse reaction to anesthesia    nausea -mom   GERD (gastroesophageal reflux disease)    Gout    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HCC)    Heart palpitations    Hyperlipidemia    Hypothyroidism    Inflammatory polyarthropathy (HCC)    Lumbago    Lumbar radiculitis    Lumbar stenosis with neurogenic claudication  Multiple myeloma (HCC)    Multiple myeloma (HCC)    Osteopenia    Osteoporosis    Other dysphagia    Palpitations    Positive PPD    Renal insufficiency    Rheumatoid arthritis (HCC)    Rheumatoid arthritis (HCC)    Seasonal allergies    Vertigo     Past Surgical History:  Procedure Laterality Date   ABDOMINAL HYSTERECTOMY     APPENDECTOMY     BACK SURGERY     BREAST EXCISIONAL BIOPSY Bilateral    neg   CHOLECYSTECTOMY     COLONOSCOPY WITH PROPOFOL N/A 11/12/2016   Procedure: COLONOSCOPY WITH PROPOFOL;  Surgeon: Manya Silvas, MD;  Location: [facility] ENDOSCOPY;  Service: Endoscopy;  Laterality: N/A;   ESOPHAGOGASTRODUODENOSCOPY (EGD) WITH PROPOFOL N/A 11/12/2016   Procedure: ESOPHAGOGASTRODUODENOSCOPY (EGD) WITH PROPOFOL;  Surgeon: Manya Silvas, MD;  Location[facility] END[facility]COPY;  Service: Endoscopy;  Laterality: N/A;   FOOT SURGERY     LUMBAR LAMINECTOMY/DECOMPRESSION MICRODISCECTOMY Left 06/18/2016   Procedure: LAMINECTOMY FOR FACET/SYNOVIAL CYST LUMBAR THREE - LUMBAR FOUR LEFT;  Surgeon: Newman Pies, MD;  Location: [place];  Service: Neurosurgery;  Laterality: Left;  LAMINECTOMY FOR FACET/SYNOVIAL CYST LUMBAR THREE - LUMBAR FOUR LEFT    (Not in a hospital admission)  Social History   Socioeconomic History   Marital status: Widowed    Spouse name: Not on file   Number of children: Not on file   Years of education: Not on file   Highest education level: Not on file  Occupational History   Not on file  Tobacco Use   Smoking status: Never   Smokeless tobacco: Never  Vaping Use   Vaping Use: Never used  Substance and Sexual Activity   Alcohol use: No   Drug use: No   Sexual activity: Yes  Other Topics Concern   Not on file  Social History Narrative   Not on file   Social Determinants of Health   Financial Resource Strain: Not on file  Food Insecurity: No Food Insecurity (04/25/2022)   Hunger Vital Sign    Worried About Running Out of Food in the Last Year: Never true    Ran Out of Food in the Last Year: Never true  Transportation Needs: No Transportation Needs (04/25/2022)   PRAPARE - Hydrologist (Medical): No    Lack of Transportation (Non-Medical): No  Physical Activity: Not on file  Stress: Not on file  Social Connections: Not on file  Intimate Partner Violence: Not At Risk (04/25/2022)   Humiliation, Afraid, Rape, and Kick questionnaire    Fear of Current or Ex-Partner: No    Emotionally Abused: No    Physically Abused: No    Sexually Abused: No     Family History  Problem Relation Age of Onset   Hypertension Mother    Heart attack Mother    Rheum arthritis Mother    Osteoarthritis Mother    Cancer Father    Hypertension Father    Stroke Father    Breast cancer Maternal Grandmother 40   Osteoarthritis Other    Rheum arthritis Other       Intake/Output Summary (Last 24 hours) at 05/07/2022 1045 Last data filed at 05/07/2022 0544 Gross per 24 hour  Intake 350 ml  Output --  Net 350 ml    Vitals:   05/06/22 1917 05/06/22 2142 05/07/22 0456 05/07/22 0726  BP: (!) 160/68 (!) 151/71 139/60 139/64  Pulse:  79 83 80 80  Resp: _0 Temp: 98.4 F (36.9 C) 98.9 F (37.2 C) 98.8 F (37.1 C)   TempSrc: Oral Oral Oral   SpO2: 96% 100% 99% 100%  Weight:      Height:        PHYSICAL EXAM General: Pleasant elderly and ill-appearing Caucasian female, well nourished, in no acute distress.  Sitting upright in ED stretcher HEENT:  Normocephalic and atraumatic. Neck:  No JVD.  Lungs: Normal respiratory effort on 2 L [place].  Decreased breath sounds with rhonchi on the right, no appreciable crackles Heart: HRRR . Normal S1 and S2 without gallops or murmurs.  Abdomen: Non-distended appearing.  Msk: Normal strength and tone for age. Extremities: Warm and well perfused. No clubbing, cyanosis.  Trace right lower extremity edema.  Neuro: Alert and oriented X 3. Psych:  Answers questions appropriately.   Labs: Basic Metabolic Panel: Recent Labs    05/06/22 1449  NA 134*  K 4.4  CL 100  CO2 23  GLUCOSE 102*  BUN 40*  CREATININE 1.45*  CALCIUM 9.1   Liver Function Tests: Recent Labs    05/06/22 1449  AST 45*  ALT 16  ALKPHOS 64  BILITOT 1.0  PROT 6.7  ALBUMIN 3.2*   No results for input(s): "LIPASE", "AMYLASE" in the last 72 hours. CBC: Recent Labs    05/06/22 1449  WBC 14.8*  HGB 9.0*  HCT 28.8*  MCV 104.0*  PLT 209   Cardiac Enzymes: Recent Labs    05/06/22 1449 05/06/22 1932  TROPONINIHS 29*  30*   BNP: Recent Labs    05/06/22 1445  BNP 1,274.7*   D-Dimer: No results for input(s): "DDIMER" in the last 72 hours. Hemoglobin A1C: No results for input(s): "HGBA1C" in the last 72 hours. Fasting Lipid Panel: No results for input(s): "CHOL", "HDL", "LDLCALC", "TRIG", "CHOLHDL", "LDLDIRECT" in the last 72 hours. Thyroid Function Tests: No results for input(s): "TSH", "T4TOTAL", "T3FREE", "THYROIDAB" in the last 72 hours.  Invalid input(s): "FREET3" Anemia Panel: No results for input(s): "VITAMINB12", "FOLATE", "FERRITIN", "TIBC", "IRON", "RETICCTPCT" in the last 72 hours.   Radiology: CT Thoracic Spine Wo Contrast  Result Date: 05/07/2022 CLINICAL DATA:  Spine fracture, thoracic, traumatic EXAM: CT THORACIC SPINE WITHOUT CONTRAST TECHNIQUE: Multidetector CT images of the thoracic were obtained using the standard protocol without intravenous contrast. RADIATION DOSE REDUCTION: This exam was performed according to the departmental dose-optimization program which includes automated exposure control, adjustment of the mA and/or kV according to patient size and/or use of iterative reconstruction technique. COMPARISON:  CT chest 04/25/2022 FINDINGS: Alignment: Normal thoracic kyphosis. Mild thoracic dextroscoliosis, apex right at T5. No listhesis. Vertebrae: There is an acute compression deformity of T9 with approximately 40% loss of height. No retropulsion. Posterior elements appear intact. Stable remote compression deformities of T7 and T12 with mild loss of height. Osseous structures are diffusely osteopenic. No lytic or blastic bone lesion. Paraspinal and other soft tissues: Paraspinal infiltration is seen at T8-9 in keeping with paraspinal edema or interstitial hemorrhage. No loculated paraspinal fluid collection identified. No canal hematoma. Additional thoracic findings are better described on accompanying CT examination of the chest. Disc levels: Endplate remodeling at A1-O8 and vacuum  disc phenomena at T11-12 is present in keeping with changes of mild degenerative disc disease. Intervertebral disc heights are preserved. No significant canal stenosis. No significant neuroforaminal narrowing. IMPRESSION: 1. Acute compression deformity of T9 with approximately 40% loss of height. No retropulsion. No listhesis.  Posterior elements appear intact. 2. Stable remote compression deformities of T7 and T12 with mild loss of height. 3. Additional thoracic findings are better described on accompanying CT examination of the chest. Electronically Signed   By: Fidela Salisbury M.D.   On: 05/07/2022 00:21   CT Angio Chest PE W and/or Wo Contrast  Result Date: 05/07/2022 CLINICAL DATA:  Pulmonary embolism (PE) suspected, high prob. Dyspnea, fever, cough EXAM: CT ANGIOGRAPHY CHEST WITH CONTRAST TECHNIQUE: Multidetector CT imaging of the chest was performed using the standard protocol during bolus administration of intravenous contrast. Multiplanar CT image reconstructions and MIPs were obtained to evaluate the vascular anatomy. RADIATION DOSE REDUCTION: This exam was performed according to the departmental dose-optimization program which includes automated exposure control, adjustment of the mA and/or kV according to patient size and/or use of iterative reconstruction technique. CONTRAST:  27m OMNIPAQUE IOHEXOL 350 MG/ML SOLN COMPARISON:  04/25/2022 FINDINGS: Cardiovascular: There is adequate opacification of the pulmonary arterial tree. No intraluminal filling defect identified to suggest acute pulmonary embolism. Central pulmonary arteries are enlarged in keeping with changes of pulmonary arterial hypertension. No significant coronary artery calcification. Mild global cardiomegaly. No pericardial effusion. Mild atherosclerotic calcification within the thoracic aorta. No aortic aneurysm. Mediastinum/Nodes: No enlarged mediastinal, hilar, or axillary lymph nodes. Thyroid gland, trachea, and esophagus demonstrate  no significant findings. Lungs/Pleura: Focal consolidation has developed within the right lower lobe since prior examination in keeping with changes of acute lobar pneumonia in the appropriate clinical setting. Scattered nodular sparse infiltrate is also noted within the right middle lobe and right upper lobe. Subsegmental atelectasis within the lingula. No pneumothorax or pleural effusion. Upper Abdomen: No acute abnormality. Musculoskeletal: Interval development of an acute compression deformity of T9 with approximately 40% loss of height. No listhesis. No retropulsion. Posterior elements of T9 appear intact. Stable remote superior endplate fractures of T7 and T12. Mild paravertebral infiltration at T12 in keeping with mild paraspinal edema or interstitial hemorrhage. No lytic or blastic bone lesion. Review of the MIP images confirms the above findings. IMPRESSION: 1. No pulmonary embolism. 2. Interval development of acute lobar pneumonia within the right lower lobe. Scattered sparse infiltrates also noted within the right middle and upper lobe 3. Interval development of an acute compression deformity of T9 with approximately 40% loss of height. No listhesis. No retropulsion. 4. Stable remote superior endplate fractures of T7 and T12. 5. Mild global cardiomegaly. Morphologic changes in keeping with pulmonary arterial hypertension. Aortic Atherosclerosis (ICD10-I70.0). Electronically Signed   By: AFidela SalisburyM.D.   On: 05/07/2022 00:11   DG Chest 2 View  Result Date: 05/06/2022 CLINICAL DATA:  sob EXAM: CHEST - 2 VIEW COMPARISON:  Chest x-ray 04/25/2022. FINDINGS: Since CT chest on 04/25/2022, probable acute/interval compression fracture of a lower thoracic vertebral body. Mild streaky bibasilar opacities. No confluent consolidation. No definite pleural effusions or pneumothorax. Cervical and lumbar fusion hardware, partially imaged. Cholecystectomy clips. IMPRESSION: 1. Since CT chest on 04/25/2022,  probable recent/interval compression fracture of a lower thoracic vertebral body. Recommend dedicated cross-sectional imaging to confirm and further evaluate. 2. Mild streaky bibasilar opacities, favor atelectasis/scar. Electronically Signed   By: FMargaretha SheffieldM.D.   On: 05/06/2022 15:17   UKoreaVenous Img Lower Bilateral (DVT)  Result Date: 04/25/2022 CLINICAL DATA:  Positive D-dimer EXAM: BILATERAL LOWER EXTREMITY VENOUS DOPPLER ULTRASOUND TECHNIQUE: Gray-scale sonography with compression, as well as color and duplex ultrasound, were performed to evaluate the deep venous system(s) from the level of the common femoral vein  through the popliteal and proximal calf veins. COMPARISON:  None Available. FINDINGS: VENOUS Normal compressibility of the common femoral, superficial femoral, and popliteal veins, as well as the visualized calf veins. Visualized portions of profunda femoral vein and great saphenous vein unremarkable. No filling defects to suggest DVT on grayscale or color Doppler imaging. Doppler waveforms show normal direction of venous flow, normal respiratory plasticity and response to augmentation. Limited views of the contralateral common femoral vein are unremarkable. OTHER None. Limitations: none IMPRESSION: Negative. Electronically Signed   By: Dorise Bullion III M.D.   On: 04/25/2022 12:31   NM Pulmonary Perfusion  Result Date: 04/25/2022 CLINICAL DATA:  PE suspected. Patient is COVID-19 positive. Shortness of breath. EXAM: NUCLEAR MEDICINE PERFUSION LUNG SCAN TECHNIQUE: Perfusion images were obtained in multiple projections after intravenous injection of radiopharmaceutical. Ventilation scans intentionally deferred if perfusion scan and chest x-ray adequate for interpretation during COVID 19 epidemic. RADIOPHARMACEUTICALS:  4.31 mCi Tc-56mMAA IV COMPARISON:  CT of the chest April 25, 2022 FINDINGS: No segmental perfusion defects identified. IMPRESSION: No segmental perfusion defects  identified. No evidence of pulmonary embolus on this study. Electronically Signed   By: DDorise BullionIII M.D.   On: 04/25/2022 11:50   CT Chest Wo Contrast  Result Date: 04/25/2022 CLINICAL DATA:  81year old female with history of shortness of breath. Recent diagnosis of COVID 2 days ago. EXAM: CT CHEST WITHOUT CONTRAST TECHNIQUE: Multidetector CT imaging of the chest was performed following the standard protocol without IV contrast. RADIATION DOSE REDUCTION: This exam was performed according to the departmental dose-optimization program which includes automated exposure control, adjustment of the mA and/or kV according to patient size and/or use of iterative reconstruction technique. COMPARISON:  No priors. FINDINGS: Cardiovascular: Heart size is normal. There is no significant pericardial fluid, thickening or pericardial calcification. There is aortic atherosclerosis, as well as atherosclerosis of the great vessels of the mediastinum and the coronary arteries, including calcified atherosclerotic plaque in the left main and left anterior descending coronary arteries. Mediastinum/Nodes: No pathologically enlarged mediastinal or hilar lymph nodes. Please note that accurate exclusion of hilar adenopathy is limited on noncontrast CT scans. Patulous fluid-filled esophagus. Small hiatal hernia. No axillary lymphadenopathy. Lungs/Pleura: Azygous lobe (normal anatomical variant) incidentally noted. Dependent areas of mild subsegmental atelectasis and/or scarring are noted in the lungs bilaterally. No acute consolidative airspace disease. No pleural effusions. 3 mm right upper lobe pulmonary nodule (axial image 64 of series 3). No other suspicious appearing pulmonary nodules or masses are noted. Small calcified granuloma in the right upper lobe. Upper Abdomen: Aortic atherosclerosis. Status post cholecystectomy. Calcified granulomas in the spleen incidentally noted. Musculoskeletal: Orthopedic fixation hardware in  the lower cervical spine incidentally noted. Chronic appearing compression fractures of T7 superior endplate and [id number] endplate, most severe at T12 where there is up to 25% loss of central vertebral body height. There are no aggressive appearing lytic or blastic lesions noted in the visualized portions of the skeleton. IMPRESSION: 1. No acute findings in the thorax. 2. Small 3 mm right upper lobe pulmonary nodule, nonspecific, but statistically likely benign. No follow-up needed if patient is low-risk.This recommendation follows the consensus statement: Guidelines for Management of Incidental Pulmonary Nodules Detected on CT Images: From the Fleischner Society 2017; Radiology 2017; 284:228-243. 3. Aortic atherosclerosis, in addition to left main and left anterior descending coronary artery disease. 4. Fluid-filled patulous esophagus. 5. Small hiatal hernia. 6. Additional incidental findings, as above. Aortic Atherosclerosis (ICD10-I70.0). Electronically Signed   By: DQuillian Quince  Entrikin M.D.   On: 04/25/2022 09:04   DG Chest Port 1 View  Result Date: 04/25/2022 CLINICAL DATA:  SOB EXAM: PORTABLE CHEST 1 VIEW COMPARISON:  04/23/2022 FINDINGS: The heart size and mediastinal contours are within normal limits. Both lungs are clear. No pneumothorax or pleural effusion. Aorta is calcified the visualized skeletal structures are unremarkable. IMPRESSION: No active disease. Electronically Signed   By: Sammie Bench M.D.   On: 04/25/2022 07:13   DG Chest 2 View  Result Date: 04/23/2022 CLINICAL DATA:  Hypotension, shortness of breath EXAM: CHEST - 2 VIEW COMPARISON:  Chest radiograph 11/27/2018 FINDINGS: The cardiomediastinal silhouette is normal. There is no focal consolidation or pulmonary edema. There is no pleural effusion or pneumothorax. There is no acute osseous abnormality. IMPRESSION: No radiographic evidence of acute cardiopulmonary process. Electronically Signed   By: Valetta Mole M.D.   On:  04/23/2022 10:50   DG Bone Survey Met  Result Date: 04/08/2022 CLINICAL DATA:  History of multiple myeloma EXAM: METASTATIC BONE SURVEY COMPARISON:  02/15/2020 FINDINGS: Metastatic bone survey was performed. Cardiac shadow is within normal limits. The lungs are well aerated bilaterally. No lytic or sclerotic lesions are noted in the chest. Postsurgical changes are noted in the cervical spine stable in appearance from the prior study. No lytic or sclerotic lesions are seen. Multilevel degenerative changes are noted. Patient is edentulous. No significant lucencies are noted within the skull. Degenerative changes about shoulder joints are seen right greater than left. The upper extremities show no focal lytic or sclerotic lesions. Thoracic and lumbar spine demonstrate degenerative changes. T12 compression fracture is noted. This is chronic in nature. No other compression deformity is seen. InterStim device is noted in the pelvis. Postsurgical changes in the lower lumbar spine are noted. No lytic or sclerotic lesions are seen in the lower extremities. IMPRESSION: No lytic or sclerotic lesions are identified. Electronically Signed   By: Inez Catalina M.D.   On: 04/08/2022 19:53    ECHO 04/12/2019 ECHOCARDIOGRAPHIC MEASUREMENTS  2D DIMENSIONS  AORTA                  Values   Normal Range   MAIN PA         Values    Normal Range                Annulus: 1.8 cm       [2.1-2.5]         PA Main: nm*       [1.5-2.1]              Aorta Sin: 2.7 cm       [2.7-3.3]    RIGHT VENTRICLE            ST Junction: nm*          [2.3-2.9]         RV Base: nm*       [<4.2]             Asc.Aorta: nm*          [2.3-3.1]          RV Mid: 3.3 cm    [<3.5]  LEFT VENTRICLE                                      RV Length: nm*       [<8.6]  LVIDd: 4.6 cm       [3.9-5.3]    INFERIOR VENA CAVA                  LVIDs: 3.4 cm                        Max. IVC: nm*       [<=2.1]                    FS: 24.6 %       [>25]             Min. IVC: nm*                    SWT: 1.0 cm       [0.5-0.9]    ------------------                    PWT: 1.0 cm       [0.5-0.9]    nm* - not measured  LEFT ATRIUM                LA Diam: 3.9 cm       [2.7-3.8]            LA A4C Area: nm*          [<20]             LA Volume: nm*          [22-52]  _________________________________________________________________________________________  ECHOCARDIOGRAPHIC DESCRIPTIONS  AORTIC ROOT                   Size: Normal             Dissection: INDETERM FOR DISSECTION  AORTIC VALVE               Leaflets: Tricuspid                   Morphology: Normal               Mobility: Fully mobile  LEFT VENTRICLE                   Size: Normal                        Anterior: Normal            Contraction: Normal                         Lateral: Normal             Closest EF: 50% (Estimated)                 Septal: Normal              LV Masses: No Masses                       Apical: Normal                    LVH: None                          Inferior: Normal  Posterior: Normal           Dias.FxClass: N/A  MITRAL VALVE               Leaflets: Normal                        Mobility: Fully mobile             Morphology: Normal  LEFT ATRIUM                   Size: Normal                       LA Masses: No masses              IA Septum: Normal IAS  MAIN PA                   Size: Normal  PULMONIC VALVE             Morphology: Normal                        Mobility: Fully mobile  RIGHT VENTRICLE              RV Masses: No Masses                         Size: Normal              Free Wall: Normal                     Contraction: Normal  TRICUSPID VALVE               Leaflets: Normal                        Mobility: Fully mobile             Morphology: Normal  RIGHT ATRIUM                   Size: Normal                        RA Other: None                RA Mass: No masses  PERICARDIUM                  Fluid: No effusion  INFERIOR VENACAVA                   Size: Not seen Not Seen  _________________________________________________________________________________________   DOPPLER ECHO and OTHER SPECIAL PROCEDURES                 Aortic: MILD AR                    No AS                         135.7 cm/sec peak vel      7.4 mmHg peak grad                 Mitral: MILD MR                    No MS  3.2 cm^2 by DOPPLER                         MV Inflow E Vel = 95.5 cm/sec     MV Annulus E'Vel = 5.7 cm/sec                         E/E'Ratio = 16.8              Tricuspid: MILD TR                    No TS                         248.4 cm/sec peak TR vel   29.7 mmHg peak RV pressure              Pulmonary: MILD PR                    No PS                         90.9 cm/sec peak vel       3.3 mmHg peak grad  _________________________________________________________________________________________  INTERPRETATION  NORMAL LEFT VENTRICULAR SYSTOLIC FUNCTION  NORMAL RIGHT VENTRICULAR SYSTOLIC FUNCTION  MILD VALVULAR REGURGITATION (See above)  NO VALVULAR STENOSIS  MILD to MODERATE AR  MILD MR, TR, PR  EF 50%   TELEMETRY reviewed by me (LT) 05/07/2022 : Sinus rhythm rate 80s  EKG reviewed by me: NSR 80  Data reviewed by me (LT) 05/07/2022: Last cardiology note, ED note, admission H&P, CBC BMP BNP telemetry CTA chest  Principal Problem:   Acute respiratory failure with hypoxia ([place]) Active Problems:   COVID-19 virus infection   Multiple myeloma ([place])   Hypothyroidism   Depression with anxiety   Rheumatoid arthritis ([place])   CKD (chronic kidney disease), stage IV (HCC)   Anemia of chronic kidney failure   Compression fracture of body of thoracic vertebra (HCC)   Pneumonia involving right lung   Elevated troponin    ASSESSMENT AND PLAN:  Sydney Gillespie "Sydney Gillespie" is an 81yoF with a PMH of 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  Hypoxic to 85% on room air.  She tested positive for COVID-19 on 12/14 has not really felt better since then.  She is being treated for bacterial pneumonia, cardiology is consulted out of concern for new onset heart failure (BNP 1274).  # Acute hypoxic respiratory failure # COVID-19 with superimposed bacterial PNA On empiric antibiotics and IV steroids -Management per primary team  # Elevated BNP # Palpitations In the setting of pneumonia, not clinically volume overloaded appearing, with an oxygen 2 L not at baseline.  BNP elevated to 1274 with a history of normal systolic function and low normal EF of 50% in 2020. -S/p IV Lasix 20 mg x 1, agree with continuing 20 mg IV twice daily with close monitoring of renal function -Continue atenolol 50 mg once daily -Strict I's/O -Echo complete, read pending  # CKD 4 Slight worsening with diuresis from creatinine 1.45-1.6, current GFR of 32 which is a little bit better than her baseline.  # demand ischemia  Minimal elevation of 29, 30 in the setting of known  renal insufficiency and absence of chest pain, this is most consistent with demand/supply mismatch and not ACS   This patient's plan of care was discussed and created with Dr. Saralyn Pilar and he is in agreement.  Signed: Tristan Schroeder , PA-C 05/07/2022, 10:45 AM [facility]

## 2022-05-07 NOTE — Assessment & Plan Note (Signed)
CKD 4

## 2022-05-07 NOTE — Assessment & Plan Note (Addendum)
Possibly triggered by intractable coughing Pain control and up with assistance Can consider neurosurgery consult to evaluate for need for kyphoplasty

## 2022-05-07 NOTE — Assessment & Plan Note (Signed)
Anemia of CKD 4 Renal function and hemoglobin at baseline

## 2022-05-07 NOTE — Assessment & Plan Note (Signed)
S/p bone marrow transplant, on immunotherapy until 3 weeks ago No acute issues suspected

## 2022-05-07 NOTE — Progress Notes (Signed)
*  PRELIMINARY RESULTS* Echocardiogram 2D Echocardiogram has been performed.  Sydney Gillespie 05/07/2022, 10:28 AM

## 2022-05-07 NOTE — Assessment & Plan Note (Signed)
Continue levothyroxine and atenolol

## 2022-05-07 NOTE — ED Notes (Signed)
Patient placed on cardiac monitor at this time.

## 2022-05-07 NOTE — Assessment & Plan Note (Signed)
Suspecting principally related to pneumonia with pulse will component of new CHF given elevated BNP, cardiomegaly and pulmonary hypertension on CT chest O2 sat with EMS was 85% requiring 2 L to maintain sats in the high 90s Continue supplemental oxygen and wean as tolerated Treat individual etiologies as listed separately

## 2022-05-07 NOTE — H&P (Addendum)
History and Physical    Patient: Sydney Gillespie ZJQ:[id number] DOB: Feb 09, 1941 DOA: 05/06/2022 DOS: the patient was seen and examined on 05/07/2022 PCP: [name], MD  Patient coming from: Home  Chief Complaint:  Chief Complaint  Patient presents with   Shortness of Breath    HPI: Sydney Gillespie is a 81 y.o. female with medical history significant for multiple myeloma (s/p of bone marrow transplantation, off immunotherapy x 3 weeks), rheumatoid arthritis, vertigo, anemia, CKD-4, hypothyroidism, gout, depression with anxiety, who presents with shortness of breath . Patient had Covid  on 12/14. Has been having incessant coughing fevers and chills, shortness of breath but no chest pain.EMS. Developed mid back pain in the past two days , worse on standing and walking.. Breathing became more difficult with worsening pain on taking a deep breath. Denies leg pain or swelling.EMS recorded O2 sat of 85% improving to 98% on 2L.  Granddaughter at bedside contributes to history.,  Positive test 12/14 ED course and data review: BP 151/79 with otherwise normal vitals.  Troponin 30 with BNP 1274 no prior history of CHF.  COVID-positive.  WBC 15,000, with hemoglobin of 9.  Procalcitonin pending.  Creatinine 1.45 which is also at baseline.  EKG, personally reviewed and interpreted showing NSR at 82 with nonspecific ST-T wave changes.  CTA chest consistent with right-sided pneumonia and also shows an acute T9 fracture as outlined below: IMPRESSION: 1. No pulmonary embolism. 2. Interval development of acute lobar pneumonia within the right lower lobe. Scattered sparse infiltrates also noted within the right middle and upper lobe 3. Interval development of an acute compression deformity of T9 with approximately 40% loss of height. No listhesis. No retropulsion. 4. Stable remote superior endplate fractures of T7 and T12. 5. Mild global cardiomegaly. Morphologic changes in keeping with pulmonary  arterial hypertension.  Patient treated with fentanyl for pain given albuterol and Zofran and hospitalist consulted for admission.   Review of Systems: As mentioned in the history of present illness. All other systems reviewed and are negative.  Past Medical History:  Diagnosis Date   Allergic rhinitis    Allergy    Anemia    Anemia    Barrett's esophagus    Blood dyscrasia    multiple myloma remission   Cancer ([place])    Change in bowel habits    Compression fracture 2013   Degenerative disc disease, lumbar    Dysrhythmia    palpitations   Esophageal reflux    Family history of adverse reaction to anesthesia    nausea -mom   GERD (gastroesophageal reflux disease)    Gout    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[place])    Heart palpitations    Hyperlipidemia    Hypothyroidism    Inflammatory polyarthropathy (HCC)    Lumbago    Lumbar radiculitis    Lumbar stenosis with neurogenic claudication    Multiple myeloma (HCC)    Multiple myeloma (HCC)    Osteopenia    Osteoporosis    Other dysphagia    Palpitations    Positive PPD    Renal insufficiency    Rheumatoid arthritis (HCC)    Rheumatoid arthritis (HCC)    Seasonal allergies    Vertigo    Past Surgical History:  Procedure Laterality Date   ABDOMINAL HYSTERECTOMY     APPENDECTOMY     BACK SURGERY     BREAST EXCISIONAL BIOPSY Bilateral    neg   CHOLECYSTECTOMY     COLONOSCOPY WITH PROPOFOL  N/A 11/12/2016   Procedure: COLONOSCOPY WITH PROPOFOL;  Surgeon: Manya Silvas, MD;  Location[facility] ENDOSCOPY;  Service: Endoscopy;  Laterality: N/A;   ESOPHAGOGASTRODUODENOSCOPY (EGD) WITH PROPOFOL N/A 11/12/2016   Procedure: ESOPHAGOGASTRODUODENOSCOPY (EGD) WITH PROPOFOL;  Surgeon: Manya Silvas, MD;  Location[facility] ENDOSCOPY;  Service: Endoscopy;  Laterality: N/A;   FOOT SURGERY     LUMBAR LAMINECTOMY/DECOMPRESSION MICRODISCECTOMY Left 06/18/2016   Procedure: LAMINECTOMY FOR FACET/SYNOVIAL CYST LUMBAR THREE - LUMBAR FOUR LEFT;   Surgeon: Newman Pies, MD;  Location: [facility];  Service: Neurosurgery;  Laterality: Left;  LAMINECTOMY FOR FACET/SYNOVIAL CYST LUMBAR THREE - LUMBAR FOUR LEFT   Social History:  reports that she has never smoked. She has never used smokeless tobacco. She reports that she does not drink alcohol and does not use drugs.  Allergies  Allergen Reactions   Isoniazid Other (See Comments)    Blisters     Family History  Problem Relation Age of Onset   Hypertension Mother    Heart attack Mother    Rheum arthritis Mother    Osteoarthritis Mother    Cancer Father    Hypertension Father    Stroke Father    Breast cancer Maternal Grandmother 70   Osteoarthritis Other    Rheum arthritis Other     Prior to Admission medications   Medication Sig Start Date End Date Taking? Authorizing Provider  acetaminophen (TYLENOL) 325 MG tablet Take 650 mg by mouth every 6 (six) hours as needed for fever or headache.    [provider]  acyclovir (ZOVIRAX) 400 MG tablet Take 1 tablet (400 mg total) by mouth 2 (two) times daily. 04/10/22   Cammie Sickle, MD  albuterol (VENTOLIN HFA) 108 (90 Base) MCG/ACT inhaler Inhale 2 puffs into the lungs every 4 (four) hours as needed for wheezing or shortness of breath. 04/27/22   Sharen Hones, MD  allopurinol (ZYLOPRIM) 300 MG tablet Take 450 mg by mouth daily. 09/14/14   [provider]  ALPRAZolam (XANAX) 0.25 MG tablet TAKE 1 TABLET(0.25 MG) BY MOUTH AT BEDTIME AS NEEDED FOR ANXIETY 04/24/22   Cammie Sickle, MD  Ascorbic Acid (VITAMIN C) 1000 MG tablet Take 1,000 mg by mouth daily.    [provider]  atenolol (TENORMIN) 50 MG tablet Take 0.5 tablets (25 mg total) by mouth daily. Patient taking differently: Take 50 mg by mouth daily. 09/05/18   Ghimire, Henreitta Leber, MD  benzonatate (TESSALON) 200 MG capsule Take 1 capsule (200 mg total) by mouth 3 (three) times daily as needed for cough. 04/24/22   Verlon Au, NP  CALCIUM  MAGNESIUM ZINC PO Take 1 tablet by mouth in the morning, at noon, and at bedtime.    [provider]  cetirizine (ZYRTEC) 10 MG tablet Take 10 mg by mouth 2 (two) times daily.     [provider]  chlorpheniramine-HYDROcodone (TUSSIONEX) 10-8 MG/5ML Take 5 mLs by mouth every 12 (twelve) hours as needed (for cough unrelieved by other medications). May cause drowsiness. 04/24/22   Verlon Au, NP  docusate sodium (COLACE) 100 MG capsule Take 100 mg by mouth 2 (two) times daily.    [provider]  DULoxetine (CYMBALTA) 20 MG capsule Take 20 mg by mouth 2 (two) times daily. 10/02/20   [provider]  fluticasone (FLONASE) 50 MCG/ACT nasal spray Place 2 sprays into the nose daily.     [provider]  hydroxychloroquine (PLAQUENIL) 200 MG tablet Take 200 mg by mouth  2 (two) times daily. 10/25/18   [provider]  lenalidomide (REVLIMID) 10 MG capsule Take 1 capsule (10 mg total) by mouth daily. Take for 21 days, then hold for 7 days. Repeat every 28 days. Patient not taking: Reported on 04/23/2022 04/16/22   Cammie Sickle, MD  levothyroxine (SYNTHROID) 112 MCG tablet Take 112 mcg by mouth daily before breakfast. 01/16/22   [provider]  MYRBETRIQ 50 MG TB24 tablet Take 50 mg by mouth daily.    [provider]  oxybutynin (DITROPAN-XL) 10 MG 24 hr tablet Take 1 tablet (10 mg total) by mouth daily. 10/21/20   Bjorn Loser, MD  Potassium 99 MG TABS Take 1 tablet by mouth daily.    [provider]  rOPINIRole (REQUIP) 0.5 MG tablet Take 0.75 mg by mouth at bedtime as needed (Restless leg). 10/02/20   [provider]  vitamin B-12 (CYANOCOBALAMIN) 1000 MCG tablet Take 1,000 mcg by mouth daily. 01/03/20   [provider]    Physical Exam: Vitals:   05/06/22 1445 05/06/22 1447 05/06/22 1917 05/06/22 2142  BP: (!) 151/79  (!) 160/68 (!) 151/71  Pulse: 79  79 83  Resp: _0 Temp: 98.9 F  (37.2 C)  98.4 F (36.9 C) 98.9 F (37.2 C)  TempSrc: Oral  Oral Oral  SpO2: 97%  96% 100%  Weight:  69.4 kg    Height:  _1  (1.651 m)     Physical Exam Vitals and nursing note reviewed.  Constitutional:      General: She is not in acute distress.    Appearance: She is ill-appearing.  HENT:     Head: Normocephalic and atraumatic.  Cardiovascular:     Rate and Rhythm: Normal rate and regular rhythm.     Heart sounds: Normal heart sounds.  Pulmonary:     Effort: Pulmonary effort is normal.     Breath sounds: Examination of the right-middle field reveals decreased breath sounds. Examination of the right-lower field reveals decreased breath sounds. Decreased breath sounds present.  Abdominal:     Palpations: Abdomen is soft.     Tenderness: There is no abdominal tenderness.  Neurological:     Mental Status: Mental status is at baseline.     Labs on Admission: I have personally reviewed following labs and imaging studies  CBC: Recent Labs  Lab 05/06/22 1449  WBC 14.8*  HGB 9.0*  HCT 28.8*  MCV 104.0*  PLT 409   Basic Metabolic Panel: Recent Labs  Lab 05/06/22 1449  NA 134*  K 4.4  CL 100  CO2 23  GLUCOSE 102*  BUN 40*  CREATININE 1.45*  CALCIUM 9.1   GFR: Estimated Creatinine Clearance: 29.8 mL/min (A) (by C-G formula based on SCr of 1.45 mg/dL (H)). Liver Function Tests: Recent Labs  Lab 05/06/22 1449  AST 45*  ALT 16  ALKPHOS 64  BILITOT 1.0  PROT 6.7  ALBUMIN 3.2*   No results for input(s): "LIPASE", "AMYLASE" in the last 168 hours. No results for input(s): "AMMONIA" in the last 168 hours. Coagulation Profile: No results for input(s): "INR", "PROTIME" in the last 168 hours. Cardiac Enzymes: No results for input(s): "CKTOTAL", "CKMB", "CKMBINDEX", "TROPONINI" in the last 168 hours. BNP (last 3 results) No results for input(s): "PROBNP" in the last 8760 hours. HbA1C: No results for input(s): "HGBA1C" in the last 72 hours. CBG: No results  for input(s): "GLUCAP" in the last 168 hours. Lipid Profile: No results for  input(s): "CHOL", "HDL", "LDLCALC", "TRIG", "CHOLHDL", "LDLDIRECT" in the last 72 hours. Thyroid Function Tests: No results for input(s): "TSH", "T4TOTAL", "FREET4", "T3FREE", "THYROIDAB" in the last 72 hours. Anemia Panel: No results for input(s): "VITAMINB12", "FOLATE", "FERRITIN", "TIBC", "IRON", "RETICCTPCT" in the last 72 hours. Urine analysis:    Component Value Date/Time   APPEARANCEUR Clear 06/24/2020 1113   GLUCOSEU Negative 06/24/2020 1113   BILIRUBINUR Negative 06/24/2020 1113   PROTEINUR Negative 06/24/2020 1113   NITRITE Negative 06/24/2020 1113   LEUKOCYTESUR Trace (A) 06/24/2020 1113    Radiological Exams on Admission: CT Thoracic Spine Wo Contrast  Result Date: 05/07/2022 CLINICAL DATA:  Spine fracture, thoracic, traumatic EXAM: CT THORACIC SPINE WITHOUT CONTRAST TECHNIQUE: Multidetector CT images of the thoracic were obtained using the standard protocol without intravenous contrast. RADIATION DOSE REDUCTION: This exam was performed according to the departmental dose-optimization program which includes automated exposure control, adjustment of the mA and/or kV according to patient size and/or use of iterative reconstruction technique. COMPARISON:  CT chest 04/25/2022 FINDINGS: Alignment: Normal thoracic kyphosis. Mild thoracic dextroscoliosis, apex right at T5. No listhesis. Vertebrae: There is an acute compression deformity of T9 with approximately 40% loss of height. No retropulsion. Posterior elements appear intact. Stable remote compression deformities of T7 and T12 with mild loss of height. Osseous structures are diffusely osteopenic. No lytic or blastic bone lesion. Paraspinal and other soft tissues: Paraspinal infiltration is seen at T8-9 in keeping with paraspinal edema or interstitial hemorrhage. No loculated paraspinal fluid collection identified. No canal hematoma. Additional thoracic findings  are better described on accompanying CT examination of the chest. Disc levels: Endplate remodeling at T0-V6 and vacuum disc phenomena at T11-12 is present in keeping with changes of mild degenerative disc disease. Intervertebral disc heights are preserved. No significant canal stenosis. No significant neuroforaminal narrowing. IMPRESSION: 1. Acute compression deformity of T9 with approximately 40% loss of height. No retropulsion. No listhesis. Posterior elements appear intact. 2. Stable remote compression deformities of T7 and T12 with mild loss of height. 3. Additional thoracic findings are better described on accompanying CT examination of the chest. Electronically Signed   By: Fidela Salisbury M.D.   On: 05/07/2022 00:21   CT Angio Chest PE W and/or Wo Contrast  Result Date: 05/07/2022 CLINICAL DATA:  Pulmonary embolism (PE) suspected, high prob. Dyspnea, fever, cough EXAM: CT ANGIOGRAPHY CHEST WITH CONTRAST TECHNIQUE: Multidetector CT imaging of the chest was performed using the standard protocol during bolus administration of intravenous contrast. Multiplanar CT image reconstructions and MIPs were obtained to evaluate the vascular anatomy. RADIATION DOSE REDUCTION: This exam was performed according to the departmental dose-optimization program which includes automated exposure control, adjustment of the mA and/or kV according to patient size and/or use of iterative reconstruction technique. CONTRAST:  4m OMNIPAQUE IOHEXOL 350 MG/ML SOLN COMPARISON:  04/25/2022 FINDINGS: Cardiovascular: There is adequate opacification of the pulmonary arterial tree. No intraluminal filling defect identified to suggest acute pulmonary embolism. Central pulmonary arteries are enlarged in keeping with changes of pulmonary arterial hypertension. No significant coronary artery calcification. Mild global cardiomegaly. No pericardial effusion. Mild atherosclerotic calcification within the thoracic aorta. No aortic aneurysm.  Mediastinum/Nodes: No enlarged mediastinal, hilar, or axillary lymph nodes. Thyroid gland, trachea, and esophagus demonstrate no significant findings. Lungs/Pleura: Focal consolidation has developed within the right lower lobe since prior examination in keeping with changes of acute lobar pneumonia in the appropriate clinical setting. Scattered nodular sparse infiltrate is also noted within the right middle lobe and right upper lobe.  Subsegmental atelectasis within the lingula. No pneumothorax or pleural effusion. Upper Abdomen: No acute abnormality. Musculoskeletal: Interval development of an acute compression deformity of T9 with approximately 40% loss of height. No listhesis. No retropulsion. Posterior elements of T9 appear intact. Stable remote superior endplate fractures of T7 and T12. Mild paravertebral infiltration at T12 in keeping with mild paraspinal edema or interstitial hemorrhage. No lytic or blastic bone lesion. Review of the MIP images confirms the above findings. IMPRESSION: 1. No pulmonary embolism. 2. Interval development of acute lobar pneumonia within the right lower lobe. Scattered sparse infiltrates also noted within the right middle and upper lobe 3. Interval development of an acute compression deformity of T9 with approximately 40% loss of height. No listhesis. No retropulsion. 4. Stable remote superior endplate fractures of T7 and T12. 5. Mild global cardiomegaly. Morphologic changes in keeping with pulmonary arterial hypertension. Aortic Atherosclerosis (ICD10-I70.0). Electronically Signed   By: Fidela Salisbury M.D.   On: 05/07/2022 00:11   DG Chest 2 View  Result Date: 05/06/2022 CLINICAL DATA:  sob EXAM: CHEST - 2 VIEW COMPARISON:  Chest x-ray 04/25/2022. FINDINGS: Since CT chest on 04/25/2022, probable acute/interval compression fracture of a lower thoracic vertebral body. Mild streaky bibasilar opacities. No confluent consolidation. No definite pleural effusions or pneumothorax.  Cervical and lumbar fusion hardware, partially imaged. Cholecystectomy clips. IMPRESSION: 1. Since CT chest on 04/25/2022, probable recent/interval compression fracture of a lower thoracic vertebral body. Recommend dedicated cross-sectional imaging to confirm and further evaluate. 2. Mild streaky bibasilar opacities, favor atelectasis/scar. Electronically Signed   By: Margaretha Sheffield M.D.   On: 05/06/2022 15:17     Data Reviewed: Relevant notes from primary care and specialist visits, past discharge summaries as available in EHR, including Care Everywhere. Prior diagnostic testing as pertinent to current admission diagnoses Updated medications and problem lists for reconciliation ED course, including vitals, labs, imaging, treatment and response to treatment Triage notes, nursing and pharmacy notes and ED provider's notes Notable results as noted in HPI   Assessment and Plan: * Acute respiratory failure with hypoxia ([place]) Suspecting principally related to pneumonia with pulse will component of new CHF given elevated BNP, cardiomegaly and pulmonary hypertension on CT chest O2 sat with EMS was 85% requiring 2 L to maintain sats in the high 90s Continue supplemental oxygen and wean as tolerated Treat individual etiologies as listed separately  Pneumonia involving right lung COVID-19 positive since 12/14 Suspect superimposed bacterial pneumonia High risk for severe infection due to chronic immunosuppressants Follow procalcitonin Rocephin and azithromycin Antitussives, incentive spirometry and supportive care Albuterol as needed for wheezing Continue supplemental oxygen to wean as tolerated  Multiple myeloma ([place]) S/p bone marrow transplant, on immunotherapy until 3 weeks ago No acute issues suspected  Elevated troponin Possible new onset CHF: Elevated BNP without history of CHF Troponin 30 with BNP 1274 Patient has chest pain that is mostly atypical and worse when coughing, suspect  related to T9 fracture Continue to trend troponin Echocardiogram to evaluate for segmental wall motion abnormality Low-dose IV Lasix 20 mg p.o. twice daily Daily weights with intake and output monitoring Cardiology consulted  CKD (chronic kidney disease), stage IV (HCC) Anemia of CKD 4 Renal function and hemoglobin at baseline  Rheumatoid arthritis ([place]) Continue Plaquenil and Arava  Hypothyroidism Continue levothyroxine and atenolol  Compression fracture of body of thoracic vertebra ([place]) Possibly triggered by intractable coughing Pain control and up with assistance Can consider neurosurgery consult to evaluate for need for kyphoplasty  Anemia of  chronic kidney failure CKD 4        DVT prophylaxis: Lovenox  Consults: none  Advance Care Planning:   Code Status: Prior   Family Communication: Granddaughter at bedside  Disposition Plan: Back to previous home environment  Severity of Illness: The appropriate patient status for this patient is INPATIENT. Inpatient status is judged to be reasonable and necessary in order to provide the required intensity of service to ensure the patient's safety. The patient's presenting symptoms, physical exam findings, and initial radiographic and laboratory data in the context of their chronic comorbidities is felt to place them at high risk for further clinical deterioration. Furthermore, it is not anticipated that the patient will be medically stable for discharge from the hospital within 2 midnights of admission.   * I certify that at the point of admission it is my clinical judgment that the patient will require inpatient hospital care spanning beyond 2 midnights from the point of admission due to high intensity of service, high risk for further deterioration and high frequency of surveillance required.*  Author: Athena Masse, MD 05/07/2022 1:00 AM  For on call review [email or web address].

## 2022-05-07 NOTE — Progress Notes (Signed)
PHARMACIST - PHYSICIAN COMMUNICATION  CONCERNING:  Enoxaparin (Lovenox) for DVT Prophylaxis    RECOMMENDATION: Patient was prescribed enoxaprin '40mg'$  q24 hours for VTE prophylaxis.   Filed Weights   05/06/22 1444 05/06/22 1447  Weight: 69.4 kg (153 lb) 69.4 kg (153 lb)    Body mass index is 25.46 kg/m.  Estimated Creatinine Clearance: 29.8 mL/min (A) (by C-G formula based on SCr of 1.45 mg/dL (H)).  Patient is candidate for enoxaparin '30mg'$  every 24 hours based on CrCl <16m/min or Weight <45kg  DESCRIPTION: Pharm[facility]  Patient is now receiving enoxaparin 30 mg every 24 hours   NRenda Rolls PharmD, MAims Outpatient Surgery12/28/2023 1:26 AM

## 2022-05-07 NOTE — Progress Notes (Signed)
Care started prior to midnight in the emergency room and patient is admitted early this morning after midnight by Dr. Judd Gaudier and I am in current agreement with her assessment and plan.  Additional changes to the plan of care have been made accordingly.  The patient is an 81 year old Caucasian female with a past medical history significant for but not limited to multiple myeloma status post bone marrow transplantation and off of immunotherapy for 3 weeks as well as history of rheumatoid arthritis, vertigo, anemia, chronic kidney disease stage IV, hypothyroidism, gout, depression and anxiety as well as other comorbidities who recently had COVID on 04/23/2022 and presents with worsening shortness of breath.  Patient has been having incessant coughing, fevers and chills and shortness of breath no chest pain.  She developed mid back pain in the past 2 weeks worse on standing and walking.  She states her breathing has become more difficult with the worsening pain on taking a deep breath and denies any leg swelling.  EMS found her to have saturations of 85% and was improved to 98% on 2 L.  She tested positive on 04/23/2022 for COVID and in the ED troponin was 30 with BNP of 1274 with no prior history of CHF.  COVID was positive and WBC was elevated to 15,000.  She had EKG done that showed normal sinus rhythm with nonspecific ST-T wave changes and a CTA that was done is consistent with right-sided lobar pneumonia the right lower lobe with scattered sparse infiltrates also noted with the right middle and upper lobe and also acute T9 fracture with 40% loss of height as well as stable remote superior endplate fractures of T7 and T12.  The patient was given fentanyl for pain as well as albuterol and Zofran and hospitalist was consulted for further evaluation and admission.  Currently she is being admitted and treated for the following but not limited to:  * Acute respiratory failure with hypoxia ([place]) -Suspecting  principally related to pneumonia with pulse will component of new CHF given elevated BNP, cardiomegaly and pulmonary hypertension on CT chest -SpO2: 100 % O2 Flow Rate (L/min): 2 L/min -O2 sat with EMS was 85% requiring 2 L to maintain sats in the high 90s -Continue supplemental oxygen and wean as tolerated -Treat individual etiologies as listed separately   Pneumonia involving right lung COVID-19 positive since 12/14 Suspect superimposed bacterial pneumonia High risk for severe infection due to chronic immunosuppressants -Follow procalcitonin and will check inflammatory markers; procalcitonin level was 0.47 -C/w Rocephin and azithromycin -WBC went from 14.8 is now 16.7 -Antitussives, incentive spirometry and supportive care -Albuterol as needed for wheezing -Continue supplemental oxygen to wean as tolerated   Multiple myeloma ([place]) -S/p bone marrow transplant, on immunotherapy until 3 weeks ago No acute issues suspected   Elevated troponin Possible new onset CHF: Elevated BNP without history of CHF -Troponin 30 with BNP 1274 -Patient has chest pain that is mostly atypical and worse when coughing, suspect related to T9 fracture -Continue to trend troponin -Echocardiogram to evaluate for segmental wall motion abnormality -Low-dose IV Lasix 20 mg p.o. twice daily -Daily weights with intake and output monitoring -Cardiology consulted and recommending continue atenolol 50 mg p.o. daily as well as agree with continuing IV Lasix admitted do not feel that the minimal elevation of her troponins is related to ACS   CKD (chronic kidney disease), stage IV (HCC) Metabolic acidosis Hyperphosphatemia -Renal function and hemoglobin at baseline -BUN/Cr Trend: Recent Labs  Lab 04/16/22 0904  04/23/22 0825 04/25/22 0646 04/26/22 0311 04/27/22 0408 05/06/22 1449 05/07/22 1057  BUN 44* 41* 48* 54* 53* 40* 45*  CREATININE 2.24* 2.26* 2.29* 2.17* 1.97* 1.45* 1.60*  -Patient has a slight  metabolic acidosis with a CO2 of 21, anion gap of 12, chloride level of 101 -Patient's Phos level is now 5.0 -Avoid Nephrotoxic Medications, Contrast Dyes, Hypotension and Dehydration to Ensure Adequate Renal Perfusion and will need to Renally Adjust Meds -Continue to Monitor and Trend Renal Function carefully and repeat CMP in the AM   Rheumatoid arthritis (HCC) -Continue Plaquenil and Arava   Hypothyroidism -Continue levothyroxine and atenolol  Elevated AST -Mild and likely reactive in the setting of above versus COVID infection -Patient's AST went from 45 is now 49 -Continue monitor and trend hepatic function carefully and if necessary obtain a right upper quadrant ultrasound as well as an acute hepatitis panel -Repeat CMP in a.m.   Compression fracture of body of thoracic vertebra (HCC) -Possibly triggered by intractable coughing -Pain control and up with assistance -Can consider neurosurgery consult to evaluate for need for kyphoplasty vs. IR   Hypoalbuminemia -Patient's albumin level is now trended down from 3.2 is now 2.8 -Continue monitor and trend and repeat CMP in a.m.   Anemia of chronic kidney failure Stage IV/macrocytic anemia -Hgb/Hct Trend: Recent Labs  Lab 04/16/22 0904 04/23/22 0825 04/25/22 0646 04/26/22 0311 04/27/22 0408 05/06/22 1449 05/07/22 1057  HGB 10.2* 9.6* 9.1* 9.0* 8.2* 9.0* 8.0*  HCT 31.4* 29.2* 29.1* 28.6* 25.8* 28.8* 25.5*  -MU pulm the a.m. -Continue to monitor for signs and symptoms of bleeding; no overt bleeding noted -Repeat CBC in a.m.  Hyponatremia -Patient's sodium is now 134 -Continue to monitor and trend and repeat CMP in the a.m.  Will continue monitor the patient's clinical response to intervention and repeat blood work and imaging in the a.m.

## 2022-05-07 NOTE — Progress Notes (Signed)
Orthopedic Tech Progress Note Patient Details:  Sydney Gillespie [birth year] [id number]  Patient ID: Sydney Gillespie, female   DOB: 1941-03-01, 81 y.o.   MRN: [id number] Order placed with [facility] for TLSO brace. Sydney Gillespie 05/07/2022, 1:43 AM

## 2022-05-07 NOTE — ED Notes (Signed)
Delayed on administering the mirabegron ER due to waiting on it to come from the main pharmacy

## 2022-05-07 NOTE — Assessment & Plan Note (Deleted)
Suspecting multifactorial and related to pneumonia as well as possible new CHF given elevated BNP, cardiomegaly and pulmonary hypertension on CT chest Continue

## 2022-05-07 NOTE — Assessment & Plan Note (Addendum)
COVID-19 positive since 12/14 Suspect superimposed bacterial pneumonia High risk for severe infection due to chronic immunosuppressants Follow procalcitonin Rocephin and azithromycin Antitussives, incentive spirometry and supportive care Albuterol as needed for wheezing Continue supplemental oxygen to wean as tolerated

## 2022-05-07 NOTE — Assessment & Plan Note (Addendum)
Possible new onset CHF: Elevated BNP without history of CHF Troponin 30 with BNP 1274 Patient has chest pain that is mostly atypical and worse when coughing, suspect related to T9 fracture Continue to trend troponin Echocardiogram to evaluate for segmental wall motion abnormality Low-dose IV Lasix 20 mg p.o. twice daily Daily weights with intake and output monitoring Cardiology consulted

## 2022-05-07 NOTE — Assessment & Plan Note (Signed)
Continue Plaquenil and Arava

## 2022-05-07 NOTE — ED Notes (Signed)
Called for TLSO brace

## 2022-05-08 ENCOUNTER — Inpatient Hospital Stay: Payer: Medicare Other

## 2022-05-08 DIAGNOSIS — N184 Chronic kidney disease, stage 4 (severe): Secondary | ICD-10-CM | POA: Diagnosis not present

## 2022-05-08 DIAGNOSIS — J189 Pneumonia, unspecified organism: Secondary | ICD-10-CM | POA: Diagnosis not present

## 2022-05-08 DIAGNOSIS — U071 COVID-19: Secondary | ICD-10-CM | POA: Diagnosis not present

## 2022-05-08 DIAGNOSIS — J9601 Acute respiratory failure with hypoxia: Secondary | ICD-10-CM | POA: Diagnosis not present

## 2022-05-08 DIAGNOSIS — R7989 Other specified abnormal findings of blood chemistry: Secondary | ICD-10-CM

## 2022-05-08 LAB — CBC WITH DIFFERENTIAL/PLATELET
Abs Immature Granulocytes: 0.15 10*3/uL — ABNORMAL HIGH (ref 0.00–0.07)
Basophils Absolute: 0 10*3/uL (ref 0.0–0.1)
Basophils Relative: 0 %
Eosinophils Absolute: 0 10*3/uL (ref 0.0–0.5)
Eosinophils Relative: 0 %
HCT: 26.3 % — ABNORMAL LOW (ref 36.0–46.0)
Hemoglobin: 8 g/dL — ABNORMAL LOW (ref 12.0–15.0)
Immature Granulocytes: 1 %
Lymphocytes Relative: 1 %
Lymphs Abs: 0.2 10*3/uL — ABNORMAL LOW (ref 0.7–4.0)
MCH: 31.6 pg (ref 26.0–34.0)
MCHC: 30.4 g/dL (ref 30.0–36.0)
MCV: 104 fL — ABNORMAL HIGH (ref 80.0–100.0)
Monocytes Absolute: 0.5 10*3/uL (ref 0.1–1.0)
Monocytes Relative: 3 %
Neutro Abs: 16.1 10*3/uL — ABNORMAL HIGH (ref 1.7–7.7)
Neutrophils Relative %: 95 %
Platelets: 179 10*3/uL (ref 150–400)
RBC: 2.53 MIL/uL — ABNORMAL LOW (ref 3.87–5.11)
RDW: 15.3 % (ref 11.5–15.5)
WBC: 17 10*3/uL — ABNORMAL HIGH (ref 4.0–10.5)
nRBC: 0 % (ref 0.0–0.2)

## 2022-05-08 LAB — COMPREHENSIVE METABOLIC PANEL
ALT: 20 U/L (ref 0–44)
AST: 42 U/L — ABNORMAL HIGH (ref 15–41)
Albumin: 2.6 g/dL — ABNORMAL LOW (ref 3.5–5.0)
Alkaline Phosphatase: 60 U/L (ref 38–126)
Anion gap: 11 (ref 5–15)
BUN: 51 mg/dL — ABNORMAL HIGH (ref 8–23)
CO2: 20 mmol/L — ABNORMAL LOW (ref 22–32)
Calcium: 8.2 mg/dL — ABNORMAL LOW (ref 8.9–10.3)
Chloride: 102 mmol/L (ref 98–111)
Creatinine, Ser: 1.48 mg/dL — ABNORMAL HIGH (ref 0.44–1.00)
GFR, Estimated: 35 mL/min — ABNORMAL LOW (ref >60)
Glucose, Bld: 114 mg/dL — ABNORMAL HIGH (ref 70–99)
Potassium: 4.2 mmol/L (ref 3.5–5.1)
Sodium: 133 mmol/L — ABNORMAL LOW (ref 135–145)
Total Bilirubin: 0.5 mg/dL (ref 0.3–1.2)
Total Protein: 5.7 g/dL — ABNORMAL LOW (ref 6.5–8.1)

## 2022-05-08 LAB — MAGNESIUM: Magnesium: 2.2 mg/dL (ref 1.7–2.4)

## 2022-05-08 LAB — LACTATE DEHYDROGENASE: LDH: 330 U/L — ABNORMAL HIGH (ref 98–192)

## 2022-05-08 LAB — IRON AND TIBC
Iron: 67 ug/dL (ref 28–170)
Saturation Ratios: 35 % — ABNORMAL HIGH (ref 10.4–31.8)
TIBC: 160 ug/dL — ABNORMAL LOW (ref 250–450)
UIBC: 104 ug/dL

## 2022-05-08 LAB — PHOSPHORUS: Phosphorus: 4.6 mg/dL (ref 2.5–4.6)

## 2022-05-08 LAB — FOLATE: Folate: 11.9 ng/mL (ref >5.9)

## 2022-05-08 LAB — RETICULOCYTES
Immature Retic Fract: 24.8 % — ABNORMAL HIGH (ref 2.3–15.9)
RBC.: 2.42 MIL/uL — ABNORMAL LOW (ref 3.87–5.11)
Retic Count, Absolute: 72.4 10*3/uL (ref 19.0–186.0)
Retic Ct Pct: 3 % (ref 0.4–3.1)

## 2022-05-08 LAB — FIBRINOGEN: Fibrinogen: 420 mg/dL (ref 210–475)

## 2022-05-08 LAB — SEDIMENTATION RATE: Sed Rate: 61 mm/hr — ABNORMAL HIGH (ref 0–30)

## 2022-05-08 LAB — FERRITIN: Ferritin: 492 ng/mL — ABNORMAL HIGH (ref 11–307)

## 2022-05-08 LAB — C-REACTIVE PROTEIN: CRP: 19.5 mg/dL — ABNORMAL HIGH (ref <1.0)

## 2022-05-08 LAB — VITAMIN B12: Vitamin B-12: 1785 pg/mL — ABNORMAL HIGH (ref 180–914)

## 2022-05-08 LAB — D-DIMER, QUANTITATIVE: D-Dimer, Quant: 7.22 ug/mL-FEU — ABNORMAL HIGH (ref 0.00–0.50)

## 2022-05-08 MED ORDER — VITAMIN C 500 MG PO TABS
500.0000 mg | ORAL_TABLET | Freq: Every day | ORAL | Status: DC
  Administered 2022-05-09 – 2022-05-10 (×2): 500 mg via ORAL
  Filled 2022-05-08 (×2): qty 1

## 2022-05-08 MED ORDER — METHYLPREDNISOLONE SODIUM SUCC 125 MG IJ SOLR
1.0000 mg/kg | Freq: Two times a day (BID) | INTRAMUSCULAR | Status: DC
  Administered 2022-05-09 – 2022-05-10 (×4): 69.375 mg via INTRAVENOUS
  Filled 2022-05-08 (×4): qty 2

## 2022-05-08 MED ORDER — SODIUM CHLORIDE 0.9 % IV SOLN
100.0000 mg | Freq: Every day | INTRAVENOUS | Status: DC
  Administered 2022-05-10: 100 mg via INTRAVENOUS
  Filled 2022-05-08: qty 20

## 2022-05-08 MED ORDER — SODIUM CHLORIDE 0.9 % IV SOLN
200.0000 mg | Freq: Once | INTRAVENOUS | Status: AC
  Administered 2022-05-09: 200 mg via INTRAVENOUS
  Filled 2022-05-08: qty 40

## 2022-05-08 MED ORDER — IPRATROPIUM-ALBUTEROL 20-100 MCG/ACT IN AERS: 1.0000 | INHALATION_SPRAY | Freq: Four times a day (QID) | RESPIRATORY_TRACT | Status: DC

## 2022-05-08 MED ORDER — IPRATROPIUM-ALBUTEROL 20-100 MCG/ACT IN AERS
1.0000 | INHALATION_SPRAY | Freq: Four times a day (QID) | RESPIRATORY_TRACT | Status: DC
  Administered 2022-05-08 – 2022-05-10 (×7): 1 via RESPIRATORY_TRACT
  Filled 2022-05-08: qty 4

## 2022-05-08 MED ORDER — ZINC SULFATE 220 (50 ZN) MG PO CAPS
220.0000 mg | ORAL_CAPSULE | Freq: Every day | ORAL | Status: DC
  Administered 2022-05-09 – 2022-05-10 (×2): 220 mg via ORAL
  Filled 2022-05-08 (×2): qty 1

## 2022-05-08 NOTE — ED Notes (Signed)
US at bedside

## 2022-05-08 NOTE — Progress Notes (Addendum)
PROGRESS NOTE    Sydney Gillespie  OEV:[id number] DOB: [birth year] DOA: 05/06/2022 PCP: [name], MD   Brief Narrative:  The patient is an 81 year old Caucasian female with a past medical history significant for but not limited to multiple myeloma status post bone marrow transplantation and off of immunotherapy for 3 weeks as well as history of rheumatoid arthritis, vertigo, anemia, chronic kidney disease stage IV, hypothyroidism, gout, depression and anxiety as well as other comorbidities who recently had COVID on 04/23/2022 and presents with worsening shortness of breath. Patient has been having incessant coughing, fevers and chills and shortness of breath no chest pain. She developed mid back pain in the past 2 weeks worse on standing and walking. She states her breathing has become more difficult with the worsening pain on taking a deep breath and denies any leg swelling. EMS found her to have saturations of 85% and was improved to 98% on 2 L. She tested positive on 04/23/2022 for COVID and in the ED troponin was 30 with BNP of 1274 with no prior history of CHF. COVID was positive and WBC was elevated to 15,000. She had EKG done that showed normal sinus rhythm with nonspecific ST-T wave changes and a CTA that was done is consistent with right-sided lobar pneumonia the right lower lobe with scattered sparse infiltrates also noted with the right middle and upper lobe and also acute T9 fracture with 40% loss of height as well as stable remote superior endplate fractures of T7 and T12. The patient was given fentanyl for pain as well as albuterol and Zofran and hospitalist was consulted for further evaluation and admission   Cardiology following and she is being diuresed.  D-dimer was elevated but she is already anticoagulated and CTA was negative for PE.  Will check lower extremity venous duplex.  Will need PT and OT to further evaluate and treat and will need to discuss with neurosurgery given  that she is having some back pain.  Echocardiogram done showed diastolic dysfunction as well as mild to moderate tricuspid regurg and mild to moderate aortic regurg.  Cardiology is deferring further cardiac diagnostics and recommending continuing current diuresis with IV Lasix twice daily   Assessment and Plan:  Acute respiratory failure with hypoxia (HCC) -Suspecting principally related to pneumonia with pulse will component of new CHF given elevated BNP, cardiomegaly and pulmonary hypertension on CT chest -SpO2: 99 % O2 Flow Rate (L/min): 2 L/min -O2 sat with EMS was 85% requiring 2 L to maintain sats in the high 90s -Continue supplemental oxygen and wean as tolerated -Treat individual etiologies as listed separately   Pneumonia involving right lung COVID-19 positive since 12/14 Suspect superimposed bacterial pneumonia High risk for severe infection due to chronic immunosuppressants -Follow procalcitonin and will check inflammatory markers;  -COVID-19 Labs  Recent Labs    05/08/22 0648  DDIMER 7.22*  FERRITIN 492*  LDH 330*  CRP 19.5*  -ESR Was 61 -Patient is symptomatic and still has a pneumonia and CRP is significantly elevated so we will start her on high-dose Solu-Medrol 1 mg/kg for 3 days and then changed to oral prednisone -Also start remdesivir Lab Results  Component Value Date   [place] (A) 05/06/2022   SARSCOV2NAA POSITIVE (A) 04/23/2022   Staves Not Detected 05/29/2020   procalcitonin level was 0.47 -C/w Rocephin and azithromycin for now -WBC went from 14.8 is now 16.7 and is now trended up to 17.0 -LE Venous Duplex done and showed "Nonocclusive thrombus in one  of the posterior tibial veins of the right calf. No evidence of DVT in the left lower extremity." -Antitussives, incentive spirometry and supportive care -Will also add zinc and vitamin C as well as flutter valve and incentive spirometry -Albuterol as needed for wheezing -Continue with  Combivent and as needed albuterol -Continue supplemental oxygen to wean as tolerated   Multiple myeloma ([place]) -S/p bone marrow transplant, on immunotherapy until 3 weeks ago -No acute issues suspected; resume home medications    Elevated troponin Acute diastolic CHF with preserved ejection fraction of 60 to 65% and grade 1 diastolic dysfunction -Troponin 30 with BNP 1274 -Patient has chest pain that is mostly atypical and worse when coughing, suspect related to T9 fracture -Continue to trend troponin -Echocardiogram to evaluate for segmental wall motion abnormality -Low-dose IV Lasix 20 mg twice daily and will continue for cardiology -Daily weights with intake and output monitoring -Patient is -160 mL -Cardiology consulted and recommending continue atenolol 50 mg p.o. daily as well as agree with continuing IV Lasix admitted do not feel that the minimal elevation of her troponins is related to ACS   CKD (chronic kidney disease), stage IV (HCC) Metabolic acidosis Hyperphosphatemia -Renal function and hemoglobin at baseline -BUN/Cr Trend: Recent Labs  Lab 04/23/22 0825 04/25/22 0646 04/26/22 0311 04/27/22 0408 05/06/22 1449 05/07/22 1057 05/08/22 0648  BUN 41* 48* 54* 53* 40* 45* 51*  CREATININE 2.26* 2.29* 2.17* 1.97* 1.45* 1.60* 1.48*  -Patient has a slight metabolic acidosis with a CO2 of 20, anion gap of 11, chloride level of 102 -Patient's Phos level is now 5.0 -Avoid Nephrotoxic Medications, Contrast Dyes, Hypotension and Dehydration to Ensure Adequate Renal Perfusion and will need to Renally Adjust Meds -Continue to Monitor and Trend Renal Function carefully and repeat CMP in the AM    Rheumatoid arthritis (HCC) -Continue Plaquenil and Arava   Hypothyroidism -Continue levothyroxine and atenolol -Check TSH in the AM   Elevated AST -Mild and likely reactive in the setting of above versus COVID infection -Patient's AST went from 45 is now 49 -> 42 -Continue monitor  and trend hepatic function carefully and if necessary obtain a right upper quadrant ultrasound as well as an acute hepatitis panel -Repeat CMP in a.m.   Compression fracture of body of thoracic vertebra (HCC) -Possibly triggered by intractable coughing -Pain control and up with assistance -Can consider neurosurgery consult to evaluate for need for kyphoplasty vs. IR and will discuss with them int eh AM] -She has a back brace   Hypoalbuminemia -Patient's albumin level is now trended down from 3.2 is now 2.8 -> 2.6 -Continue monitor and trend and repeat CMP in a.m.   Anemia of chronic kidney failure Stage IV/macrocytic anemia -Hgb/Hct Trend: Recent Labs  Lab 04/23/22 0825 04/25/22 0646 04/26/22 0311 04/27/22 0408 05/06/22 1449 05/07/22 1057 05/08/22 1203  HGB 9.6* 9.1* 9.0* 8.2* 9.0* 8.0* 8.0*  HCT 29.2* 29.1* 28.6* 25.8* 28.8* 25.5* 26.3*  MCV 99.7 103.2* 102.9* 99.6 104.0* 103.2* 104.0*  -Check Anemia Panel in the AM  -Continue to monitor for signs and symptoms of bleeding; no overt bleeding noted -Repeat CBC in a.m.   Hyponatremia -Patient's sodium is now 134 yesterday and today is now 133 -Continue to monitor and trend and repeat CMP in the a.m  DVT prophylaxis: enoxaparin (LOVENOX) injection 30 mg Start: 05/07/22 1000    Code Status: Full Code Family Communication: No family currently at bedside  Disposition Plan:  Level of care: Progressive Status is: Inpatient Remains  inpatient appropriate because: His further clinical workup and will need evaluation by neurosurgery as well as PT and OT and cardiology   Consultants:  Cardiology  Procedures:  Echocardiogram IMPRESSIONS     1. Left ventricular ejection fraction, by estimation, is 60 to 65%. The  left ventricle has normal function. The left ventricle has no regional  wall motion abnormalities. Left ventricular diastolic parameters are  consistent with Grade I diastolic  dysfunction (impaired relaxation).    2. Right ventricular systolic function is normal. The right ventricular  size is normal.   3. The mitral valve is normal in structure. Mild mitral valve  regurgitation. No evidence of mitral stenosis.   4. Tricuspid valve regurgitation is mild to moderate.   5. The aortic valve is normal in structure. Aortic valve regurgitation is  mild to moderate. No aortic stenosis is present.   6. The inferior vena cava is normal in size with greater than 50%  respiratory variability, suggesting right atrial pressure of 3 mmHg.   FINDINGS   Left Ventricle: Left ventricular ejection fraction, by estimation, is 60  to 65%. The left ventricle has normal function. The left ventricle has no  regional wall motion abnormalities. The left ventricular internal cavity  size was normal in size. There is   no left ventricular hypertrophy. Left ventricular diastolic parameters  are consistent with Grade I diastolic dysfunction (impaired relaxation).   Right Ventricle: The right ventricular size is normal. No increase in  right ventricular wall thickness. Right ventricular systolic function is  normal.   Left Atrium: Left atrial size was normal in size.   Right Atrium: Right atrial size was normal in size.   Pericardium: Trivial pericardial effusion is present.   Mitral Valve: The mitral valve is normal in structure. Mild mitral valve  regurgitation. No evidence of mitral valve stenosis.   Tricuspid Valve: The tricuspid valve is normal in structure. Tricuspid  valve regurgitation is mild to moderate. No evidence of tricuspid  stenosis.   Aortic Valve: The aortic valve is normal in structure. Aortic valve  regurgitation is mild to moderate. Aortic regurgitation PHT measures 445  msec. No aortic stenosis is present. Aortic valve mean gradient measures  4.5 mmHg. Aortic valve peak gradient  measures 7.8 mmHg. Aortic valve area, by VTI measures 2.99 cm.   Pulmonic Valve: The pulmonic valve was normal in  structure. Pulmonic valve  regurgitation is not visualized. No evidence of pulmonic stenosis.   Aorta: The aortic root is normal in size and structure.   Venous: The inferior vena cava is normal in size with greater than 50%  respiratory variability, suggesting right atrial pressure of 3 mmHg.   IAS/Shunts: No atrial level shunt detected by color flow Doppler.     LEFT VENTRICLE  PLAX 2D  LVIDd:         4.50 cm   Diastology  LVIDs:         2.70 cm   LV e' medial:    5.66 cm/s  LV PW:         0.90 cm   LV E/e' medial:  18.6  LV IVS:        0.80 cm   LV e' lateral:   10.70 cm/s  LVOT diam:     2.00 cm   LV E/e' lateral: 9.8  LV SV:         85  LV SV Index:   48  LVOT Area:     3.14 cm  RIGHT VENTRICLE  RV Basal diam:  2.40 cm  RV Mid diam:    2.80 cm  RV S prime:     13.60 cm/s  TAPSE (M-mode): 2.1 cm   LEFT ATRIUM             Index        RIGHT ATRIUM          Index  LA diam:        4.40 cm 2.49 cm/m   RA Area:     7.95 cm  LA Vol (A2C):   87.6 ml 49.62 ml/m  RA Volume:   10.40 ml 5.89 ml/m  LA Vol (A4C):   61.0 ml 34.56 ml/m  LA Biplane Vol: 73.9 ml 41.86 ml/m   AORTIC VALVE  AV Area (Vmax):    2.41 cm  AV Area (Vmean):   2.27 cm  AV Area (VTI):     2.99 cm  AV Vmax:           139.50 cm/s  AV Vmean:          102.350 cm/s  AV VTI:            0.284 m  AV Peak Grad:      7.8 mmHg  AV Mean Grad:      4.5 mmHg  LVOT Vmax:         107.00 cm/s  LVOT Vmean:        73.900 cm/s  LVOT VTI:          0.270 m  LVOT/AV VTI ratio: 0.95  AI PHT:            445 msec    AORTA  Ao Root diam: 3.27 cm   MITRAL VALVE                TRICUSPID VALVE  MV Area (PHT): 4.89 cm     TR Peak grad:   14.6 mmHg  MV Decel Time: 155 msec     TR Vmax:        191.00 cm/s  MV E velocity: 105.00 cm/s  MV A velocity: 152.00 cm/s  SHUNTS  MV E/A ratio:  0.69         Systemic VTI:  0.27 m                              Systemic Diam: 2.00 cm    LE extremity venous  duplex  Antimicrobials:  Anti-infectives (From admission, onward)    Start     Dose/Rate Route Frequency Ordered Stop   05/09/22 1000  hydroxychloroquine (PLAQUENIL) tablet 200 mg       See Hyperspace for full Linked Orders Report.   200 mg Oral Once per day on Sun Sat 05/07/22 1458     05/07/22 2200  hydroxychloroquine (PLAQUENIL) tablet 200 mg       See Hyperspace for full Linked Orders Report.   200 mg Oral 2 times per day on Mon Tue Wed Thu Fri 05/07/22 1458     05/07/22 1000  hydroxychloroquine (PLAQUENIL) tablet 200 mg  Status:  Discontinued        200 mg Oral 2 times daily 05/07/22 0118 05/07/22 1457   05/07/22 0300  ceFEPIme (MAXIPIME) 2 g in sodium chloride 0.9 % 100 mL IVPB        2 g 200 mL/hr over 30 Minutes Intravenous  Once 05/07/22 0250 05/07/22 0544  05/07/22 0130  cefTRIAXone (ROCEPHIN) 2 g in sodium chloride 0.9 % 100 mL IVPB        2 g 200 mL/hr over 30 Minutes Intravenous Every 24 hours 05/07/22 0118 05/12/22 0129   05/07/22 0130  azithromycin (ZITHROMAX) 500 mg in sodium chloride 0.9 % 250 mL IVPB        500 mg 250 mL/hr over 60 Minutes Intravenous Every 24 hours 05/07/22 0118 05/12/22 0129       Subjective: Seen and examined and think she is doing better.  Still difficult for her to take a deep breath.  No nausea or vomiting.  Denies any lightheadedness or dizziness.  No other concerns or complaints at this time.  Objective: Vitals:   05/08/22 1400 05/08/22 1530 05/08/22 1620 05/08/22 2010  BP: 136/65 136/69 (!) 145/78 (!) 141/65  Pulse: 77 76 76 72  Resp: 14 (!) _0 Temp:   97.9 F (36.6 C) 97.9 F (36.6 C)  TempSrc:   Oral   SpO2: 95% 95% 97% 99%  Weight:      Height:        Intake/Output Summary (Last 24 hours) at 05/08/2022 2156 Last data filed at 05/08/2022 2297 Gross per 24 hour  Intake 240 ml  Output 750 ml  Net -510 ml   Filed Weights   05/06/22 1444 05/06/22 1447  Weight: 69.4 kg 69.4 kg   Examination: Physical  Exam:  Constitutional: WN/WD overweight Caucasian female currently no acute distress Respiratory: Diminished to auscultation bilaterally with coarse breath sounds, no wheezing, rales, rhonchi or crackles. Normal respiratory effort and patient is not tachypenic. No accessory muscle use.  Wearing supplemental oxygen via nasal cannula Cardiovascular: RRR, no murmurs / rubs / gallops. S1 and S2 auscultated.  1+ extremity edema Abdomen: Soft, non-tender, non-distended. Bowel sounds positive.  GU: Deferred. Musculoskeletal: No clubbing / cyanosis of digits/nails. No joint deformity upper and lower extremities Skin: No rashes, lesions, ulcers on limited skin evaluation. No induration; Warm and dry.  Neurologic: CN 2-12 grossly intact with no focal deficits. Romberg sign and cerebellar reflexes not assessed.  Psychiatric: Normal judgment and insight. Alert and oriented x 3. Normal mood and appropriate affect.   Data Reviewed: I have personally reviewed following labs and imaging studies  CBC: Recent Labs  Lab 05/06/22 1449 05/07/22 1057 05/08/22 1203  WBC 14.8* 16.7* 17.0*  NEUTROABS  --  16.2* 16.1*  HGB 9.0* 8.0* 8.0*  HCT 28.8* 25.5* 26.3*  MCV 104.0* 103.2* 104.0*  PLT 209 190 989   Basic Metabolic Panel: Recent Labs  Lab 05/06/22 1449 05/07/22 1057 05/08/22 0648  NA 134* 134* 133*  K 4.4 4.8 4.2  CL 100 101 102  CO2 23 21* 20*  GLUCOSE 102* 105* 114*  BUN 40* 45* 51*  CREATININE 1.45* 1.60* 1.48*  CALCIUM 9.1 8.6* 8.2*  MG  --  2.1 2.2  PHOS  --  5.0* 4.6   GFR: Estimated Creatinine Clearance: 29.2 mL/min (A) (by C-G formula based on SCr of 1.48 mg/dL (H)). Liver Function Tests: Recent Labs  Lab 05/06/22 1449 05/07/22 1057 05/08/22 0648  AST 45* 49* 42*  ALT _1 ALKPHOS 64 62 60  BILITOT 1.0 1.1 0.5  PROT 6.7 6.1* 5.7*  ALBUMIN 3.2* 2.8* 2.6*   No results for input(s): "LIPASE", "AMYLASE" in the last 168 hours. No results for input(s): "AMMONIA" in the  last 168 hours. Coagulation Profile: No results for input(s): "INR", "PROTIME" in the  last 168 hours. Cardiac Enzymes: No results for input(s): "CKTOTAL", "CKMB", "CKMBINDEX", "TROPONINI" in the last 168 hours. BNP (last 3 results) No results for input(s): "PROBNP" in the last 8760 hours. HbA1C: No results for input(s): "HGBA1C" in the last 72 hours. CBG: No results for input(s): "GLUCAP" in the last 168 hours. Lipid Profile: No results for input(s): "CHOL", "HDL", "LDLCALC", "TRIG", "CHOLHDL", "LDLDIRECT" in the last 72 hours. Thyroid Function Tests: No results for input(s): "TSH", "T4TOTAL", "FREET4", "T3FREE", "THYROIDAB" in the last 72 hours. Anemia Panel: Recent Labs    05/08/22 0648  VITAMINB12 1,785*  FOLATE 11.9  FERRITIN 492*  TIBC 160*  IRON 67  RETICCTPCT 3.0   Sepsis Labs: Recent Labs  Lab 05/06/22 1932  PROCALCITON 0.47    Recent Results (from the past 240 hour(s))  Resp panel by RT-PCR (RSV, Flu A&B, Covid) Anterior Nasal Swab     Status: Abnormal   Collection Time: 05/06/22  2:52 PM   Specimen: Anterior Nasal Swab  Result Value Ref Range Status   SARS Coronavirus 2 by RT PCR POSITIVE (A) NEGATIVE Final    Comment: (NOTE) SARS-CoV-2 target nucleic acids are DETECTED.  The SARS-CoV-2 RNA is generally detectable in upper respiratory specimens during the acute phase of infection. Positive results are indicative of the presence of the identified virus, but do not rule out bacterial infection or co-infection with other pathogens not detected by the test. Clinical correlation with patient history and other diagnostic information is necessary to determine patient infection status. The expected result is Negative.  Fact Sheet for Patients: EntrepreneurPulse.com.au  Fact Sheet for Healthcare Providers: IncredibleEmployment.be  This test is not yet approved or cleared by the [place] FDA and  has been authorized for  detection and/or diagnosis of SARS-CoV-2 by FDA under an Emergency Use Authorization (EUA).  This EUA will remain in effect (meaning this test can be used) for the duration of  the COVID-19 declaration under Section 564(b)(1) of the A ct, 21 U.S.C. section 360bbb-3(b)(1), unless the authorization is terminated or revoked sooner.     Influenza A by PCR NEGATIVE NEGATIVE Final   Influenza B by PCR NEGATIVE NEGATIVE Final    Comment: (NOTE) The Xpert Xpress SARS-CoV-2/FLU/RSV plus assay is intended as an aid in the diagnosis of influenza from Nasopharyngeal swab specimens and should not be used as a sole basis for treatment. Nasal washings and aspirates are unacceptable for Xpert Xpress SARS-CoV-2/FLU/RSV testing.  Fact Sheet for Patients: EntrepreneurPulse.com.au  Fact Sheet for Healthcare Providers: IncredibleEmployment.be  This test is not yet approved or cleared by the [place] FDA and has been authorized for detection and/or diagnosis of SARS-CoV-2 by FDA under an Emergency Use Authorization (EUA). This EUA will remain in effect (meaning this test can be used) for the duration of the COVID-19 declaration under Section 564(b)(1) of the Act, 21 U.S.C. section 360bbb-3(b)(1), unless the authorization is terminated or revoked.     Resp Syncytial Virus by PCR NEGATIVE NEGATIVE Final    Comment: (NOTE) Fact Sheet for Patients: EntrepreneurPulse.com.au  Fact Sheet for Healthcare Providers: IncredibleEmployment.be  This test is not yet approved or cleared by the [place] FDA and has been authorized for detection and/or diagnosis of SARS-CoV-2 by FDA under an Emergency Use Authorization (EUA). This EUA will remain in effect (meaning this test can be used) for the duration of the COVID-19 declaration under Section 564(b)(1) of the Act, 21 U.S.C. section 360bbb-3(b)(1), unless the authorization is  terminated or revoked.  Performed  at [facility], [address] [place], [place] [address]      Radiology Studies: US Venous Img Lower Bilateral (DVT)  Result Date: 05/08/2022 CLINICAL DATA:  Elevated D-dimer EXAM: BILATERAL LOWER EXTREMITY VENOUS DOPPLER ULTRASOUND TECHNIQUE: Gray-scale sonography with graded compression, as well as color Doppler and duplex ultrasound were performed to evaluate the lower extremity deep venous systems from the level of the common femoral vein and including the common femoral, femoral, profunda femoral, popliteal and calf veins including the posterior tibial, peroneal and gastrocnemius veins when visible. The superficial great saphenous vein was also interrogated. Spectral Doppler was utilized to evaluate flow at rest and with distal augmentation maneuvers in the common femoral, femoral and popliteal veins. COMPARISON:  Ultrasound 04/25/2022 FINDINGS: RIGHT LOWER EXTREMITY Common Femoral Vein: No evidence of thrombus. Normal compressibility, respiratory phasicity and response to augmentation. Saphenofemoral Junction: No evidence of thrombus. Normal compressibility and flow on color Doppler imaging. Profunda Femoral Vein: No evidence of thrombus. Normal compressibility and flow on color Doppler imaging. Femoral Vein: No evidence of thrombus. Normal compressibility, respiratory phasicity and response to augmentation. Popliteal Vein: No evidence of thrombus. Normal compressibility, respiratory phasicity and response to augmentation. Calf Veins: Nonocclusive thrombus in one of the posterior tibial veins. Patent peroneal vein. Superficial Great Saphenous Vein: No evidence of thrombus. Normal compressibility. Venous Reflux:  None. Other Findings:  None. LEFT LOWER EXTREMITY Common Femoral Vein: No evidence of thrombus. Normal compressibility, respiratory phasicity and response to augmentation. Saphenofemoral Junction: No evidence of thrombus. Normal compressibility  and flow on color Doppler imaging. Profunda Femoral Vein: No evidence of thrombus. Normal compressibility and flow on color Doppler imaging. Femoral Vein: No evidence of thrombus. Normal compressibility, respiratory phasicity and response to augmentation. Popliteal Vein: No evidence of thrombus. Normal compressibility, respiratory phasicity and response to augmentation. Calf Veins: No evidence of thrombus. Normal compressibility and flow on color Doppler imaging. Superficial Great Saphenous Vein: No evidence of thrombus. Normal compressibility. Venous Reflux:  None. Other Findings:  None. IMPRESSION: Nonocclusive thrombus in one of the posterior tibial veins of the right calf. No evidence of DVT in the left lower extremity. Electronically Signed   By: Maurine Simmering M.D.   On: 05/08/2022 11:57   DG Chest Port 1 View  Result Date: 05/08/2022 CLINICAL DATA:  Per MD note, pneumonia EXAM: PORTABLE CHEST 1 VIEW COMPARISON:  05/06/2022 FINDINGS: Similar cardiomegaly. Aortic atherosclerotic calcification. Bibasilar atelectasis/infiltrates. The right basilar infiltrates are compatible with pneumonia on CT 05/06/2022 though these are not well demonstrated radiographically. Pulmonary vascular congestion. Dilated gas-filled loops of bowel in the upper abdomen. Cervical spine fusion hardware. IMPRESSION: Bibasilar infiltrates/atelectasis. The right lower lobe pneumonia seen on chest CT 05/06/2022 is not well demonstrated radiographically. Cardiomegaly and pulmonary vascular congestion. Partially visualized dilated loops of bowel in the upper abdomen. If obstruction is a concern, consider CT for further evaluation. Electronically Signed   By: Placido Sou M.D.   On: 05/08/2022 00:58   ECHOCARDIOGRAM COMPLETE  Result Date: 05/07/2022    ECHOCARDIOGRAM REPORT   Patient Name:   Sydney Gillespie Date of Exam: 05/07/2022 Medical Rec #:  [id number]          Height:       65.0 in Accession #:    [id number]         Weight:        153.0 lb Date of Birth:  Sep 09, 1940           BSA:  1.765 m Patient Age:    34 years           BP:           139/64 mmHg Patient Gender: F                  HR:           80 bpm. Exam Location:  [facility] Procedure: 2D Echo, Cardiac Doppler and Color Doppler Indications:     CHF-acute diastolic A91.91  History:         Patient has no prior history of Echocardiogram examinations.                  Arrythmias:LBBB; Risk Factors:Dyslipidemia. Palpitations.  Sonographer:     Sherrie Sport Referring Phys:  [id number] Athena Masse Diagnosing Phys: Isaias Cowman MD  Sonographer Comments: Image quality was good. IMPRESSIONS  1. Left ventricular ejection fraction, by estimation, is 60 to 65%. The left ventricle has normal function. The left ventricle has no regional wall motion abnormalities. Left ventricular diastolic parameters are consistent with Grade I diastolic dysfunction (impaired relaxation).  2. Right ventricular systolic function is normal. The right ventricular size is normal.  3. The mitral valve is normal in structure. Mild mitral valve regurgitation. No evidence of mitral stenosis.  4. Tricuspid valve regurgitation is mild to moderate.  5. The aortic valve is normal in structure. Aortic valve regurgitation is mild to moderate. No aortic stenosis is present.  6. The inferior vena cava is normal in size with greater than 50% respiratory variability, suggesting right atrial pressure of 3 mmHg. FINDINGS  Left Ventricle: Left ventricular ejection fraction, by estimation, is 60 to 65%. The left ventricle has normal function. The left ventricle has no regional wall motion abnormalities. The left ventricular internal cavity size was normal in size. There is  no left ventricular hypertrophy. Left ventricular diastolic parameters are consistent with Grade I diastolic dysfunction (impaired relaxation). Right Ventricle: The right ventricular size is normal. No increase in right ventricular wall thickness. Right  ventricular systolic function is normal. Left Atrium: Left atrial size was normal in size. Right Atrium: Right atrial size was normal in size. Pericardium: Trivial pericardial effusion is present. Mitral Valve: The mitral valve is normal in structure. Mild mitral valve regurgitation. No evidence of mitral valve stenosis. Tricuspid Valve: The tricuspid valve is normal in structure. Tricuspid valve regurgitation is mild to moderate. No evidence of tricuspid stenosis. Aortic Valve: The aortic valve is normal in structure. Aortic valve regurgitation is mild to moderate. Aortic regurgitation PHT measures 445 msec. No aortic stenosis is present. Aortic valve mean gradient measures 4.5 mmHg. Aortic valve peak gradient measures 7.8 mmHg. Aortic valve area, by VTI measures 2.99 cm. Pulmonic Valve: The pulmonic valve was normal in structure. Pulmonic valve regurgitation is not visualized. No evidence of pulmonic stenosis. Aorta: The aortic root is normal in size and structure. Venous: The inferior vena cava is normal in size with greater than 50% respiratory variability, suggesting right atrial pressure of 3 mmHg. IAS/Shunts: No atrial level shunt detected by color flow Doppler.  LEFT VENTRICLE PLAX 2D LVIDd:         4.50 cm   Diastology LVIDs:         2.70 cm   LV e' medial:    5.66 cm/s LV PW:         0.90 cm   LV E/e' medial:  18.6 LV IVS:  0.80 cm   LV e' lateral:   10.70 cm/s LVOT diam:     2.00 cm   LV E/e' lateral: 9.8 LV SV:         85 LV SV Index:   48 LVOT Area:     3.14 cm  RIGHT VENTRICLE RV Basal diam:  2.40 cm RV Mid diam:    2.80 cm RV S prime:     13.60 cm/s TAPSE (M-mode): 2.1 cm LEFT ATRIUM             Index        RIGHT ATRIUM          Index LA diam:        4.40 cm 2.49 cm/m   RA Area:     7.95 cm LA Vol (A2C):   87.6 ml 49.62 ml/m  RA Volume:   10.40 ml 5.89 ml/m LA Vol (A4C):   61.0 ml 34.56 ml/m LA Biplane Vol: 73.9 ml 41.86 ml/m  AORTIC VALVE AV Area (Vmax):    2.41 cm AV Area (Vmean):    2.27 cm AV Area (VTI):     2.99 cm AV Vmax:           139.50 cm/s AV Vmean:          102.350 cm/s AV VTI:            0.284 m AV Peak Grad:      7.8 mmHg AV Mean Grad:      4.5 mmHg LVOT Vmax:         107.00 cm/s LVOT Vmean:        73.900 cm/s LVOT VTI:          0.270 m LVOT/AV VTI ratio: 0.95 AI PHT:            445 msec  AORTA Ao Root diam: 3.27 cm MITRAL VALVE                TRICUSPID VALVE MV Area (PHT): 4.89 cm     TR Peak grad:   14.6 mmHg MV Decel Time: 155 msec     TR Vmax:        191.00 cm/s MV E velocity: 105.00 cm/s MV A velocity: 152.00 cm/s  SHUNTS MV E/A ratio:  0.69         Systemic VTI:  0.27 m                             Systemic Diam: 2.00 cm Isaias Cowman MD Electronically signed by Isaias Cowman MD Signature Date/Time: 05/07/2022/1:15:16 PM    Final    CT Thoracic Spine Wo Contrast  Result Date: 05/07/2022 CLINICAL DATA:  Spine fracture, thoracic, traumatic EXAM: CT THORACIC SPINE WITHOUT CONTRAST TECHNIQUE: Multidetector CT images of the thoracic were obtained using the standard protocol without intravenous contrast. RADIATION DOSE REDUCTION: This exam was performed according to the departmental dose-optimization program which includes automated exposure control, adjustment of the mA and/or kV according to patient size and/or use of iterative reconstruction technique. COMPARISON:  CT chest 04/25/2022 FINDINGS: Alignment: Normal thoracic kyphosis. Mild thoracic dextroscoliosis, apex right at T5. No listhesis. Vertebrae: There is an acute compression deformity of T9 with approximately 40% loss of height. No retropulsion. Posterior elements appear intact. Stable remote compression deformities of T7 and T12 with mild loss of height. Osseous structures are diffusely osteopenic. No lytic or blastic bone lesion. Paraspinal and other soft  tissues: Paraspinal infiltration is seen at T8-9 in keeping with paraspinal edema or interstitial hemorrhage. No loculated paraspinal fluid  collection identified. No canal hematoma. Additional thoracic findings are better described on accompanying CT examination of the chest. Disc levels: Endplate remodeling at J6-E8 and vacuum disc phenomena at T11-12 is present in keeping with changes of mild degenerative disc disease. Intervertebral disc heights are preserved. No significant canal stenosis. No significant neuroforaminal narrowing. IMPRESSION: 1. Acute compression deformity of T9 with approximately 40% loss of height. No retropulsion. No listhesis. Posterior elements appear intact. 2. Stable remote compression deformities of T7 and T12 with mild loss of height. 3. Additional thoracic findings are better described on accompanying CT examination of the chest. Electronically Signed   By: Fidela Salisbury M.D.   On: 05/07/2022 00:21   CT Angio Chest PE W and/or Wo Contrast  Result Date: 05/07/2022 CLINICAL DATA:  Pulmonary embolism (PE) suspected, high prob. Dyspnea, fever, cough EXAM: CT ANGIOGRAPHY CHEST WITH CONTRAST TECHNIQUE: Multidetector CT imaging of the chest was performed using the standard protocol during bolus administration of intravenous contrast. Multiplanar CT image reconstructions and MIPs were obtained to evaluate the vascular anatomy. RADIATION DOSE REDUCTION: This exam was performed according to the departmental dose-optimization program which includes automated exposure control, adjustment of the mA and/or kV according to patient size and/or use of iterative reconstruction technique. CONTRAST:  78m OMNIPAQUE IOHEXOL 350 MG/ML SOLN COMPARISON:  04/25/2022 FINDINGS: Cardiovascular: There is adequate opacification of the pulmonary arterial tree. No intraluminal filling defect identified to suggest acute pulmonary embolism. Central pulmonary arteries are enlarged in keeping with changes of pulmonary arterial hypertension. No significant coronary artery calcification. Mild global cardiomegaly. No pericardial effusion. Mild  atherosclerotic calcification within the thoracic aorta. No aortic aneurysm. Mediastinum/Nodes: No enlarged mediastinal, hilar, or axillary lymph nodes. Thyroid gland, trachea, and esophagus demonstrate no significant findings. Lungs/Pleura: Focal consolidation has developed within the right lower lobe since prior examination in keeping with changes of acute lobar pneumonia in the appropriate clinical setting. Scattered nodular sparse infiltrate is also noted within the right middle lobe and right upper lobe. Subsegmental atelectasis within the lingula. No pneumothorax or pleural effusion. Upper Abdomen: No acute abnormality. Musculoskeletal: Interval development of an acute compression deformity of T9 with approximately 40% loss of height. No listhesis. No retropulsion. Posterior elements of T9 appear intact. Stable remote superior endplate fractures of T7 and T12. Mild paravertebral infiltration at T12 in keeping with mild paraspinal edema or interstitial hemorrhage. No lytic or blastic bone lesion. Review of the MIP images confirms the above findings. IMPRESSION: 1. No pulmonary embolism. 2. Interval development of acute lobar pneumonia within the right lower lobe. Scattered sparse infiltrates also noted within the right middle and upper lobe 3. Interval development of an acute compression deformity of T9 with approximately 40% loss of height. No listhesis. No retropulsion. 4. Stable remote superior endplate fractures of T7 and T12. 5. Mild global cardiomegaly. Morphologic changes in keeping with pulmonary arterial hypertension. Aortic Atherosclerosis (ICD10-I70.0). Electronically Signed   By: AFidela SalisburyM.D.   On: 05/07/2022 00:11    Scheduled Meds:  atenolol  50 mg Oral Daily   DULoxetine  20 mg Oral BID   enoxaparin (LOVENOX) injection  30 mg Subcutaneous Q24H   furosemide  20 mg Intravenous BID   guaiFENesin  600 mg Oral BID   hydroxychloroquine  200 mg Oral 2 times per day on Mon Tue Wed Thu Fri    And   [START ON  05/09/2022] hydroxychloroquine  200 mg Oral Once per day on Sun Sat   Ipratropium-Albuterol  1 puff Inhalation Q6H   levothyroxine  112 mcg Oral Q0600   mirabegron ER  50 mg Oral Daily   oxybutynin  10 mg Oral Daily   pantoprazole  40 mg Oral BID AC   predniSONE  40 mg Oral Q breakfast   tiZANidine  2 mg Oral QHS   Continuous Infusions:  azithromycin Stopped (05/08/22 0413)   cefTRIAXone (ROCEPHIN)  IV Stopped (05/08/22 0335)    LOS: 1 day   Raiford Noble, DO [facility] Available via Epic secure chat 7am-7pm After these hours, please refer to coverage provider listed on [email or web address] 05/08/2022, 9:56 PM

## 2022-05-08 NOTE — Progress Notes (Signed)
[place] NOTE       Patient ID: Sydney Gillespie MRN: [id number] DOB/AGE: 06-23-1940 81 y.o.  Admit date: 05/06/2022 Referring Physician Dr. Judd Gaudier  Primary Physician Dr. Ginette Pitman Primary Cardiologist Dr. Saralyn Pilar (last seen in 2021)  Reason for Consultation ?CHF  HPI: Sydney Cobb "[name]" is an 81yoF with a PMH of multiple myeloma (dx 2012 s/p stem cell transplant, relapse 2021, ongoing tx), CKD 4, RA, palpitations, who presented to [facility] ED 05/06/2022 with cough, fever, and shortness of breath.  Hypoxic to 85% on room air.  She tested positive for COVID-19 on 12/14 has not really felt better since then.  She is being treated for bacterial pneumonia, cardiology is consulted out of concern for new onset heart failure (BNP 1274).  Interval history: -Echo resulted with preserved LVEF, grade 1 diastolic dysfunction, mild MR, mild-moderate TR, mild-moderate AR without evidence of aortic stenosis -overall similar to prior study from 2020 -No I/O recorded. Has a bladder stimulator that her granddaughter brought the remote for yesterday afternoon, after she turned off the stimulator she had great UOP. Peripheral edema improved. Remains on supplemental O2. -deep inspiration difficult d/t pain from T7 fracture, no chest pain or palpitations -renal function stable with diuresis  Review of systems complete and found to be negative unless listed above     Past Medical History:  Diagnosis Date   Allergic rhinitis    Allergy    Anemia    Anemia    Barrett's esophagus    Blood dyscrasia    multiple myloma remission   Cancer (HCC)    Change in bowel habits    Compression fracture 2013   Degenerative disc disease, lumbar    Dysrhythmia    palpitations   Esophageal reflux    Family history of adverse reaction to anesthesia    nausea -mom   GERD (gastroesophageal reflux disease)    Gout    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[place])    Heart palpitations     Hyperlipidemia    Hypothyroidism    Inflammatory polyarthropathy (HCC)    Lumbago    Lumbar radiculitis    Lumbar stenosis with neurogenic claudication    Multiple myeloma ([place])    Multiple myeloma ([place])    Osteopenia    Osteoporosis    Other dysphagia    Palpitations    Positive PPD    Renal insufficiency    Rheumatoid arthritis (HCC)    Rheumatoid arthritis (HCC)    Seasonal allergies    Vertigo     Past Surgical History:  Procedure Laterality Date   ABDOMINAL HYSTERECTOMY     APPENDECTOMY     BACK SURGERY     BREAST EXCISIONAL BIOPSY Bilateral    neg   CHOLECYSTECTOMY     COLONOSCOPY WITH PROPOFOL N/A 11/12/2016   Procedure: COLONOSCOPY WITH PROPOFOL;  Surgeon: Manya Silvas, MD;  Location: [facility] ENDOSCOPY;  Service: Endoscopy;  Laterality: N/A;   ESOPHAGOGASTRODUODENOSCOPY (EGD) WITH PROPOFOL N/A 11/12/2016   Procedure: ESOPHAGOGASTRODUODENOSCOPY (EGD) WITH PROPOFOL;  Surgeon: Manya Silvas, MD;  Location: [facility] ENDOSCOPY;  Service: Endoscopy;  Laterality: N/A;   FOOT SURGERY     LUMBAR LAMINECTOMY/DECOMPRESSION MICRODISCECTOMY Left 06/18/2016   Procedure: LAMINECTOMY FOR FACET/SYNOVIAL CYST LUMBAR THREE - LUMBAR FOUR LEFT;  Surgeon: Newman Pies, MD;  Location: [facility];  Service: Neurosurgery;  Laterality: Left;  LAMINECTOMY FOR FACET/SYNOVIAL CYST LUMBAR THREE - LUMBAR FOUR LEFT    (Not in a hospital admission)  Social  History   Socioeconomic History   Marital status: Widowed    Spouse name: Not on file   Number of children: Not on file   Years of education: Not on file   Highest education level: Not on file  Occupational History   Not on file  Tobacco Use   Smoking status: Never   Smokeless tobacco: Never  Vaping Use   Vaping Use: Never used  Substance and Sexual Activity   Alcohol use: No   Drug use: No   Sexual activity: Yes  Other Topics Concern   Not on file  Social History Narrative   Not on file   Social Determinants of Health   Financial  Resource Strain: Not on file  Food Insecurity: No Food Insecurity (04/25/2022)   Hunger Vital Sign    Worried About Running Out of Food in the Last Year: Never true    Ran Out of Food in the Last Year: Never true  Transportation Needs: No Transportation Needs (04/25/2022)   PRAPARE - Hydrologist (Medical): No    Lack of Transportation (Non-Medical): No  Physical Activity: Not on file  Stress: Not on file  Social Connections: Not on file  Intimate Partner Violence: Not At Risk (04/25/2022)   Humiliation, Afraid, Rape, and Kick questionnaire    Fear of Current or Ex-Partner: No    Emotionally Abused: No    Physically Abused: No    Sexually Abused: No    Family History  Problem Relation Age of Onset   Hypertension Mother    Heart attack Mother    Rheum arthritis Mother    Osteoarthritis Mother    Cancer Father    Hypertension Father    Stroke Father    Breast cancer Maternal Grandmother 40   Osteoarthritis Other    Rheum arthritis Other      No intake or output data in the 24 hours ending 05/08/22 0959   Vitals:   05/08/22 0030 05/08/22 0300 05/08/22 0600 05/08/22 0800  BP: (!) 162/67 (!) 149/67 (!) 144/61 (!) 141/60  Pulse: 79 68 77 73  Resp: _0 Temp:  97.9 F (36.6 C) 97.7 F (36.5 C) 97.9 F (36.6 C)  TempSrc:  Oral Axillary   SpO2: 100% 100% 98% 99%  Weight:      Height:        PHYSICAL EXAM General: Pleasant 81 elderly and ill-appearing Caucasian female, well nourished, in no acute distress.  Sitting upright in ED stretcher,  HEENT:  Normocephalic and atraumatic. Neck:  No JVD.  Lungs: Normal respiratory effort on 2 L [place].  Decreased breath sounds with rhonchi on the right, no appreciable crackles Heart: HRRR . Normal S1 and S2 without gallops or murmurs.  Abdomen: Non-distended appearing.  Msk: Normal strength and tone for age. Extremities: Warm and well perfused. No clubbing, cyanosis. Peripheral edema resolved Neuro:  Alert and oriented X 3. Psych:  Answers questions appropriately.   Labs: Basic Metabolic Panel: Recent Labs    05/06/22 1449 05/07/22 1057  NA 134* 134*  K 4.4 4.8  CL 100 101  CO2 23 21*  GLUCOSE 102* 105*  BUN 40* 45*  CREATININE 1.45* 1.60*  CALCIUM 9.1 8.6*  MG  --  2.1  PHOS  --  5.0*    Liver Function Tests: Recent Labs    05/06/22 1449 05/07/22 1057  AST 45* 49*  ALT 16 21  ALKPHOS 64 62  BILITOT 1.0 1.1  PROT 6.7 6.1*  ALBUMIN 3.2* 2.8*    No results for input(s): "LIPASE", "AMYLASE" in the last 72 hours. CBC: Recent Labs    05/06/22 1449 05/07/22 1057  WBC 14.8* 16.7*  NEUTROABS  --  16.2*  HGB 9.0* 8.0*  HCT 28.8* 25.5*  MCV 104.0* 103.2*  PLT 209 190    Cardiac Enzymes: Recent Labs    05/06/22 1449 05/06/22 1932  TROPONINIHS 29* 30*    BNP: Recent Labs    05/06/22 1445  BNP 1,274.7*    D-Dimer: Recent Labs    05/08/22 0648  DDIMER 7.22*   Hemoglobin A1C: No results for input(s): "HGBA1C" in the last 72 hours. Fasting Lipid Panel: No results for input(s): "CHOL", "HDL", "LDLCALC", "TRIG", "CHOLHDL", "LDLDIRECT" in the last 72 hours. Thyroid Function Tests: No results for input(s): "TSH", "T4TOTAL", "T3FREE", "THYROIDAB" in the last 72 hours.  Invalid input(s): "FREET3" Anemia Panel: Recent Labs    05/08/22 0648  RETICCTPCT 3.0     Radiology: [facility] 1 View  Result Date: 05/08/2022 CLINICAL DATA:  Per MD note, pneumonia EXAM: PORTABLE CHEST 1 VIEW COMPARISON:  05/06/2022 FINDINGS: Similar cardiomegaly. Aortic atherosclerotic calcification. Bibasilar atelectasis/infiltrates. The right basilar infiltrates are compatible with pneumonia on CT 05/06/2022 though these are not well demonstrated radiographically. Pulmonary vascular congestion. Dilated gas-filled loops of bowel in the upper abdomen. Cervical spine fusion hardware. IMPRESSION: Bibasilar infiltrates/atelectasis. The right lower lobe pneumonia seen on chest CT  05/06/2022 is not well demonstrated radiographically. Cardiomegaly and pulmonary vascular congestion. Partially visualized dilated loops of bowel in the upper abdomen. If obstruction is a concern, consider CT for further evaluation. Electronically Signed   By: Placido Sou M.D.   On: 05/08/2022 00:58   ECHOCARDIOGRAM COMPLETE  Result Date: 05/07/2022    ECHOCARDIOGRAM REPORT   Patient Name:   Sydney Gillespie Date of Exam: 05/07/2022 Medical Rec #:  [id number]          Height:       65.0 in Accession #:    [id number]         Weight:       153.0 lb Date of Birth:  Apr 12, 1941           BSA:          1.765 m Patient Age:    63 years           BP:           139/64 mmHg Patient Gender: F                  HR:           80 bpm. Exam Location:  [facility] Procedure: 2D Echo, Cardiac Doppler and Color Doppler Indications:     CHF-acute diastolic G18.29  History:         Patient has no prior history of Echocardiogram examinations.                  Arrythmias:LBBB; Risk Factors:Dyslipidemia. Palpitations.  Sonographer:     Sherrie Sport Referring Phys:  [id number] Athena Masse Diagnosing Phys: Isaias Cowman MD  Sonographer Comments: Image quality was good. IMPRESSIONS  1. Left ventricular ejection fraction, by estimation, is 60 to 65%. The left ventricle has normal function. The left ventricle has no regional wall motion abnormalities. Left ventricular diastolic parameters are consistent with Grade I diastolic dysfunction (impaired relaxation).  2. Right ventricular systolic function is normal. The right ventricular size is normal.  3. The mitral valve is normal in structure. Mild mitral valve regurgitation. No evidence of mitral stenosis.  4. Tricuspid valve regurgitation is mild to moderate.  5. The aortic valve is normal in structure. Aortic valve regurgitation is mild to moderate. No aortic stenosis is present.  6. The inferior vena cava is normal in size with greater than 50% respiratory variability, suggesting right  atrial pressure of 3 mmHg. FINDINGS  Left Ventricle: Left ventricular ejection fraction, by estimation, is 60 to 65%. The left ventricle has normal function. The left ventricle has no regional wall motion abnormalities. The left ventricular internal cavity size was normal in size. There is  no left ventricular hypertrophy. Left ventricular diastolic parameters are consistent with Grade I diastolic dysfunction (impaired relaxation). Right Ventricle: The right ventricular size is normal. No increase in right ventricular wall thickness. Right ventricular systolic function is normal. Left Atrium: Left atrial size was normal in size. Right Atrium: Right atrial size was normal in size. Pericardium: Trivial pericardial effusion is present. Mitral Valve: The mitral valve is normal in structure. Mild mitral valve regurgitation. No evidence of mitral valve stenosis. Tricuspid Valve: The tricuspid valve is normal in structure. Tricuspid valve regurgitation is mild to moderate. No evidence of tricuspid stenosis. Aortic Valve: The aortic valve is normal in structure. Aortic valve regurgitation is mild to moderate. Aortic regurgitation PHT measures 445 msec. No aortic stenosis is present. Aortic valve mean gradient measures 4.5 mmHg. Aortic valve peak gradient measures 7.8 mmHg. Aortic valve area, by VTI measures 2.99 cm. Pulmonic Valve: The pulmonic valve was normal in structure. Pulmonic valve regurgitation is not visualized. No evidence of pulmonic stenosis. Aorta: The aortic root is normal in size and structure. Venous: The inferior vena cava is normal in size with greater than 50% respiratory variability, suggesting right atrial pressure of 3 mmHg. IAS/Shunts: No atrial level shunt detected by color flow Doppler.  LEFT VENTRICLE PLAX 2D LVIDd:         4.50 cm   Diastology LVIDs:         2.70 cm   LV e' medial:    5.66 cm/s LV PW:         0.90 cm   LV E/e' medial:  18.6 LV IVS:        0.80 cm   LV e' lateral:   10.70 cm/s  LVOT diam:     2.00 cm   LV E/e' lateral: 9.8 LV SV:         85 LV SV Index:   48 LVOT Area:     3.14 cm  RIGHT VENTRICLE RV Basal diam:  2.40 cm RV Mid diam:    2.80 cm RV S prime:     13.60 cm/s TAPSE (M-mode): 2.1 cm LEFT ATRIUM             Index        RIGHT ATRIUM          Index LA diam:        4.40 cm 2.49 cm/m   RA Area:     7.95 cm LA Vol (A2C):   87.6 ml 49.62 ml/m  RA Volume:   10.40 ml 5.89 ml/m LA Vol (A4C):   61.0 ml 34.56 ml/m LA Biplane Vol: 73.9 ml 41.86 ml/m  AORTIC VALVE AV Area (Vmax):    2.41 cm AV Area (Vmean):   2.27 cm AV Area (VTI):     2.99 cm AV Vmax:  139.50 cm/s AV Vmean:          102.350 cm/s AV VTI:            0.284 m AV Peak Grad:      7.8 mmHg AV Mean Grad:      4.5 mmHg LVOT Vmax:         107.00 cm/s LVOT Vmean:        73.900 cm/s LVOT VTI:          0.270 m LVOT/AV VTI ratio: 0.95 AI PHT:            445 msec  AORTA Ao Root diam: 3.27 cm MITRAL VALVE                TRICUSPID VALVE MV Area (PHT): 4.89 cm     TR Peak grad:   14.6 mmHg MV Decel Time: 155 msec     TR Vmax:        191.00 cm/s MV E velocity: 105.00 cm/s MV A velocity: 152.00 cm/s  SHUNTS MV E/A ratio:  0.69         Systemic VTI:  0.27 m                             Systemic Diam: 2.00 cm Isaias Cowman MD Electronically signed by Isaias Cowman MD Signature Date/Time: 05/07/2022/1:15:16 PM    Final    CT Thoracic Spine Wo Contrast  Result Date: 05/07/2022 CLINICAL DATA:  Spine fracture, thoracic, traumatic EXAM: CT THORACIC SPINE WITHOUT CONTRAST TECHNIQUE: Multidetector CT images of the thoracic were obtained using the standard protocol without intravenous contrast. RADIATION DOSE REDUCTION: This exam was performed according to the departmental dose-optimization program which includes automated exposure control, adjustment of the mA and/or kV according to patient size and/or use of iterative reconstruction technique. COMPARISON:  CT chest 04/25/2022 FINDINGS: Alignment: Normal thoracic  kyphosis. Mild thoracic dextroscoliosis, apex right at T5. No listhesis. Vertebrae: There is an acute compression deformity of T9 with approximately 40% loss of height. No retropulsion. Posterior elements appear intact. Stable remote compression deformities of T7 and T12 with mild loss of height. Osseous structures are diffusely osteopenic. No lytic or blastic bone lesion. Paraspinal and other soft tissues: Paraspinal infiltration is seen at T8-9 in keeping with paraspinal edema or interstitial hemorrhage. No loculated paraspinal fluid collection identified. No canal hematoma. Additional thoracic findings are better described on accompanying CT examination of the chest. Disc levels: Endplate remodeling at B3-Z3 and vacuum disc phenomena at T11-12 is present in keeping with changes of mild degenerative disc disease. Intervertebral disc heights are preserved. No significant canal stenosis. No significant neuroforaminal narrowing. IMPRESSION: 1. Acute compression deformity of T9 with approximately 40% loss of height. No retropulsion. No listhesis. Posterior elements appear intact. 2. Stable remote compression deformities of T7 and T12 with mild loss of height. 3. Additional thoracic findings are better described on accompanying CT examination of the chest. Electronically Signed   By: Fidela Salisbury M.D.   On: 05/07/2022 00:21   CT Angio Chest PE W and/or Wo Contrast  Result Date: 05/07/2022 CLINICAL DATA:  Pulmonary embolism (PE) suspected, high prob. Dyspnea, fever, cough EXAM: CT ANGIOGRAPHY CHEST WITH CONTRAST TECHNIQUE: Multidetector CT imaging of the chest was performed using the standard protocol during bolus administration of intravenous contrast. Multiplanar CT image reconstructions and MIPs were obtained to evaluate the vascular anatomy. RADIATION DOSE REDUCTION: This exam was performed according to the  departmental dose-optimization program which includes automated exposure control, adjustment of the mA  and/or kV according to patient size and/or use of iterative reconstruction technique. CONTRAST:  26m OMNIPAQUE IOHEXOL 350 MG/ML SOLN COMPARISON:  04/25/2022 FINDINGS: Cardiovascular: There is adequate opacification of the pulmonary arterial tree. No intraluminal filling defect identified to suggest acute pulmonary embolism. Central pulmonary arteries are enlarged in keeping with changes of pulmonary arterial hypertension. No significant coronary artery calcification. Mild global cardiomegaly. No pericardial effusion. Mild atherosclerotic calcification within the thoracic aorta. No aortic aneurysm. Mediastinum/Nodes: No enlarged mediastinal, hilar, or axillary lymph nodes. Thyroid gland, trachea, and esophagus demonstrate no significant findings. Lungs/Pleura: Focal consolidation has developed within the right lower lobe since prior examination in keeping with changes of acute lobar pneumonia in the appropriate clinical setting. Scattered nodular sparse infiltrate is also noted within the right middle lobe and right upper lobe. Subsegmental atelectasis within the lingula. No pneumothorax or pleural effusion. Upper Abdomen: No acute abnormality. Musculoskeletal: Interval development of an acute compression deformity of T9 with approximately 40% loss of height. No listhesis. No retropulsion. Posterior elements of T9 appear intact. Stable remote superior endplate fractures of T7 and T12. Mild paravertebral infiltration at T12 in keeping with mild paraspinal edema or interstitial hemorrhage. No lytic or blastic bone lesion. Review of the MIP images confirms the above findings. IMPRESSION: 1. No pulmonary embolism. 2. Interval development of acute lobar pneumonia within the right lower lobe. Scattered sparse infiltrates also noted within the right middle and upper lobe 3. Interval development of an acute compression deformity of T9 with approximately 40% loss of height. No listhesis. No retropulsion. 4. Stable remote  superior endplate fractures of T7 and T12. 5. Mild global cardiomegaly. Morphologic changes in keeping with pulmonary arterial hypertension. Aortic Atherosclerosis (ICD10-I70.0). Electronically Signed   By: AFidela SalisburyM.D.   On: 05/07/2022 00:11   DG Chest 2 View  Result Date: 05/06/2022 CLINICAL DATA:  sob EXAM: CHEST - 2 VIEW COMPARISON:  Chest x-ray 04/25/2022. FINDINGS: Since CT chest on 04/25/2022, probable acute/interval compression fracture of a lower thoracic vertebral body. Mild streaky bibasilar opacities. No confluent consolidation. No definite pleural effusions or pneumothorax. Cervical and lumbar fusion hardware, partially imaged. Cholecystectomy clips. IMPRESSION: 1. Since CT chest on 04/25/2022, probable recent/interval compression fracture of a lower thoracic vertebral body. Recommend dedicated cross-sectional imaging to confirm and further evaluate. 2. Mild streaky bibasilar opacities, favor atelectasis/scar. Electronically Signed   By: FMargaretha SheffieldM.D.   On: 05/06/2022 15:17   UKoreaVenous Img Lower Bilateral (DVT)  Result Date: 04/25/2022 CLINICAL DATA:  Positive D-dimer EXAM: BILATERAL LOWER EXTREMITY VENOUS DOPPLER ULTRASOUND TECHNIQUE: Gray-scale sonography with compression, as well as color and duplex ultrasound, were performed to evaluate the deep venous system(s) from the level of the common femoral vein through the popliteal and proximal calf veins. COMPARISON:  None Available. FINDINGS: VENOUS Normal compressibility of the common femoral, superficial femoral, and popliteal veins, as well as the visualized calf veins. Visualized portions of profunda femoral vein and great saphenous vein unremarkable. No filling defects to suggest DVT on grayscale or color Doppler imaging. Doppler waveforms show normal direction of venous flow, normal respiratory plasticity and response to augmentation. Limited views of the contralateral common femoral vein are unremarkable. OTHER None.  Limitations: none IMPRESSION: Negative. Electronically Signed   By: DDorise BullionIII M.D.   On: 04/25/2022 12:31   NM Pulmonary Perfusion  Result Date: 04/25/2022 CLINICAL DATA:  PE suspected. Patient is COVID-19 positive. Shortness  of breath. EXAM: NUCLEAR MEDICINE PERFUSION LUNG SCAN TECHNIQUE: Perfusion images were obtained in multiple projections after intravenous injection of radiopharmaceutical. Ventilation scans intentionally deferred if perfusion scan and chest x-ray adequate for interpretation during COVID 19 epidemic. RADIOPHARMACEUTICALS:  4.31 mCi Tc-59mMAA IV COMPARISON:  CT of the chest April 25, 2022 FINDINGS: No segmental perfusion defects identified. IMPRESSION: No segmental perfusion defects identified. No evidence of pulmonary embolus on this study. Electronically Signed   By: DDorise BullionIII M.D.   On: 04/25/2022 11:50   CT Chest Wo Contrast  Result Date: 04/25/2022 CLINICAL DATA:  81year old female with history of shortness of breath. Recent diagnosis of COVID 2 days ago. EXAM: CT CHEST WITHOUT CONTRAST TECHNIQUE: Multidetector CT imaging of the chest was performed following the standard protocol without IV contrast. RADIATION DOSE REDUCTION: This exam was performed according to the departmental dose-optimization program which includes automated exposure control, adjustment of the mA and/or kV according to patient size and/or use of iterative reconstruction technique. COMPARISON:  No priors. FINDINGS: Cardiovascular: Heart size is normal. There is no significant pericardial fluid, thickening or pericardial calcification. There is aortic atherosclerosis, as well as atherosclerosis of the great vessels of the mediastinum and the coronary arteries, including calcified atherosclerotic plaque in the left main and left anterior descending coronary arteries. Mediastinum/Nodes: No pathologically enlarged mediastinal or hilar lymph nodes. Please note that accurate exclusion of hilar  adenopathy is limited on noncontrast CT scans. Patulous fluid-filled esophagus. Small hiatal hernia. No axillary lymphadenopathy. Lungs/Pleura: Azygous lobe (normal anatomical variant) incidentally noted. Dependent areas of mild subsegmental atelectasis and/or scarring are noted in the lungs bilaterally. No acute consolidative airspace disease. No pleural effusions. 3 mm right upper lobe pulmonary nodule (axial image 64 of series 3). No other suspicious appearing pulmonary nodules or masses are noted. Small calcified granuloma in the right upper lobe. Upper Abdomen: Aortic atherosclerosis. Status post cholecystectomy. Calcified granulomas in the spleen incidentally noted. Musculoskeletal: Orthopedic fixation hardware in the lower cervical spine incidentally noted. Chronic appearing compression fractures of T7 superior endplate and TW09superior endplate, most severe at T12 where there is up to 25% loss of central vertebral body height. There are no aggressive appearing lytic or blastic lesions noted in the visualized portions of the skeleton. IMPRESSION: 1. No acute findings in the thorax. 2. Small 3 mm right upper lobe pulmonary nodule, nonspecific, but statistically likely benign. No follow-up needed if patient is low-risk.This recommendation follows the consensus statement: Guidelines for Management of Incidental Pulmonary Nodules Detected on CT Images: From the Fleischner Society 2017; Radiology 2017; 284:228-243. 3. Aortic atherosclerosis, in addition to left main and left anterior descending coronary artery disease. 4. Fluid-filled patulous esophagus. 5. Small hiatal hernia. 6. Additional incidental findings, as above. Aortic Atherosclerosis (ICD10-I70.0). Electronically Signed   By: DVinnie LangtonM.D.   On: 04/25/2022 09:04   DG Chest Port 1 View  Result Date: 04/25/2022 CLINICAL DATA:  SOB EXAM: PORTABLE CHEST 1 VIEW COMPARISON:  04/23/2022 FINDINGS: The heart size and mediastinal contours are within  normal limits. Both lungs are clear. No pneumothorax or pleural effusion. Aorta is calcified the visualized skeletal structures are unremarkable. IMPRESSION: No active disease. Electronically Signed   By: JSammie BenchM.D.   On: 04/25/2022 07:13   DG Chest 2 View  Result Date: 04/23/2022 CLINICAL DATA:  Hypotension, shortness of breath EXAM: CHEST - 2 VIEW COMPARISON:  Chest radiograph 11/27/2018 FINDINGS: The cardiomediastinal silhouette is normal. There is no focal consolidation or pulmonary edema.  There is no pleural effusion or pneumothorax. There is no acute osseous abnormality. IMPRESSION: No radiographic evidence of acute cardiopulmonary process. Electronically Signed   By: Valetta Mole M.D.   On: 04/23/2022 10:50    ECHO 04/12/2019 ECHOCARDIOGRAPHIC MEASUREMENTS  2D DIMENSIONS  AORTA                  Values   Normal Range   MAIN PA         Values    Normal Range                Annulus: 1.8 cm       [2.1-2.5]         PA Main: nm*       [1.5-2.1]              Aorta Sin: 2.7 cm       [2.7-3.3]    RIGHT VENTRICLE            ST Junction: nm*          [2.3-2.9]         RV Base: nm*       [<4.2]             Asc.Aorta: nm*          [2.3-3.1]          RV Mid: 3.3 cm    [<3.5]  LEFT VENTRICLE                                      RV Length: nm*       [<8.6]                 LVIDd: 4.6 cm       [3.9-5.3]    INFERIOR VENA CAVA                  LVIDs: 3.4 cm                        Max. IVC: nm*       [<=2.1]                    FS: 24.6 %       [>25]            Min. IVC: nm*                    SWT: 1.0 cm       [0.5-0.9]    ------------------                    PWT: 1.0 cm       [0.5-0.9]    nm* - not measured  LEFT ATRIUM                LA Diam: 3.9 cm       [2.7-3.8]            LA A4C Area: nm*          [<20]             LA Volume: nm*          [22-52]  _________________________________________________________________________________________  ECHOCARDIOGRAPHIC DESCRIPTIONS  AORTIC ROOT                    Size: Normal  Dissection: INDETERM FOR DISSECTION  AORTIC VALVE               Leaflets: Tricuspid                   Morphology: Normal               Mobility: Fully mobile  LEFT VENTRICLE                   Size: Normal                        Anterior: Normal            Contraction: Normal                         Lateral: Normal             Closest EF: 50% (Estimated)                 Septal: Normal              LV Masses: No Masses                       Apical: Normal                    LVH: None                          Inferior: Normal                                                      Posterior: Normal           Dias.FxClass: N/A  MITRAL VALVE               Leaflets: Normal                        Mobility: Fully mobile             Morphology: Normal  LEFT ATRIUM                   Size: Normal                       LA Masses: No masses              IA Septum: Normal IAS  MAIN PA                   Size: Normal  PULMONIC VALVE             Morphology: Normal                        Mobility: Fully mobile  RIGHT VENTRICLE              RV Masses: No Masses                         Size: Normal              Free Wall: Normal  Contraction: Normal  TRICUSPID VALVE               Leaflets: Normal                        Mobility: Fully mobile             Morphology: Normal  RIGHT ATRIUM                   Size: Normal                        RA Other: None                RA Mass: No masses  PERICARDIUM                 Fluid: No effusion  INFERIOR VENACAVA                   Size: Not seen Not Seen  _________________________________________________________________________________________   DOPPLER ECHO and OTHER SPECIAL PROCEDURES                 Aortic: MILD AR                    No AS                         135.7 cm/sec peak vel      7.4 mmHg peak grad                 Mitral: MILD MR                    No MS                         3.2 cm^2 by  DOPPLER                         MV Inflow E Vel = 95.5 cm/sec     MV Annulus E'Vel = 5.7 cm/sec                         E/E'Ratio = 16.8              Tricuspid: MILD TR                    No TS                         248.4 cm/sec peak TR vel   29.7 mmHg peak RV pressure              Pulmonary: MILD PR                    No PS                         90.9 cm/sec peak vel       3.3 mmHg peak grad  _________________________________________________________________________________________  INTERPRETATION  NORMAL LEFT VENTRICULAR SYSTOLIC FUNCTION  NORMAL RIGHT VENTRICULAR SYSTOLIC FUNCTION  MILD VALVULAR REGURGITATION (See above)  NO VALVULAR STENOSIS  MILD to MODERATE AR  MILD MR, TR, PR  EF 50%   TELEMETRY reviewed by me (LT) 05/08/2022 : Sinus rhythm rate 80s  EKG reviewed by me: NSR 80  Data reviewed by me (LT) 05/08/2022: Hospitalist progress note, I/os CBC BMP BNP telemetry   Principal Problem:   Acute respiratory failure with hypoxia (HCC) Active Problems:   COVID-19 virus infection   Multiple myeloma (HCC)   Hypothyroidism   Depression with anxiety   Rheumatoid arthritis ([place])   CKD (chronic kidney disease), stage IV (HCC)   Anemia of chronic kidney failure   Compression fracture of body of thoracic vertebra (HCC)   Pneumonia involving right lung   Elevated troponin    ASSESSMENT AND PLAN:  Sydney Cobb "[name]" is an 40yoF with a PMH of multiple myeloma (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  Hypoxic to 85% on room air.  She tested positive for COVID-19 on 12/14 has not really felt better since then.  She is being treated for bacterial pneumonia, cardiology is consulted out of concern for new onset heart failure (BNP 1274).  # Acute hypoxic respiratory failure # COVID-19 with superimposed bacterial PNA On empiric antibiotics and IV  steroids -Management per primary team  # acute on chronic HFpEF (60-65%, G1DD 04/2022)  # Palpitations In the setting of pneumonia, not clinically volume overloaded appearing, with an oxygen 2 L not at baseline.  BNP elevated to 1274 with a history of normal systolic function and low normal EF of 50% in 2020. Echo this admission without interval change in EF or valve insufficiencies.  -S/p IV Lasix 20 mg x 1, agree with continuing 20 mg IV twice daily with close monitoring of renal function -Continue atenolol 50 mg once daily -consider addition of ACE/ARB, MRA if BP allows following IV diuresis -Strict I's/O -Defer further cardiac diagnostics  # CKD 4 Slight worsening with diuresis from creatinine 1.45-1.6, current GFR of 32 which is a little bit better than her baseline.  # demand ischemia  Minimal elevation of 29, 30 in the setting of known renal insufficiency and absence of chest pain, this is most consistent with demand/supply mismatch and not ACS   This patient's plan of care was discussed and created with Dr. Saralyn Pilar and he is in agreement.  Signed: Tristan Schroeder , PA-C 05/08/2022, 9:59 AM [facility]

## 2022-05-08 NOTE — ED Notes (Signed)
MD Alfredia Ferguson notified of pt d dimer results.

## 2022-05-08 NOTE — ED Notes (Signed)
RN attempted to place TLSO brace on patient, pt refused and just wanted repositioning. RN explained the purpose and importance of brace.

## 2022-05-08 NOTE — ED Notes (Signed)
Assumed care from [place], [place]. Pt resting comfortably in bed at this time. Pt denies any current needs or questions. Call light with in reach.

## 2022-05-09 ENCOUNTER — Inpatient Hospital Stay: Payer: Medicare Other

## 2022-05-09 DIAGNOSIS — U071 COVID-19: Secondary | ICD-10-CM | POA: Diagnosis not present

## 2022-05-09 DIAGNOSIS — N184 Chronic kidney disease, stage 4 (severe): Secondary | ICD-10-CM | POA: Diagnosis not present

## 2022-05-09 DIAGNOSIS — J9601 Acute respiratory failure with hypoxia: Secondary | ICD-10-CM | POA: Diagnosis not present

## 2022-05-09 DIAGNOSIS — I824Y1 Acute embolism and thrombosis of unspecified deep veins of right proximal lower extremity: Secondary | ICD-10-CM

## 2022-05-09 DIAGNOSIS — J189 Pneumonia, unspecified organism: Secondary | ICD-10-CM | POA: Diagnosis not present

## 2022-05-09 LAB — CBC WITH DIFFERENTIAL/PLATELET
Abs Immature Granulocytes: 0.09 10*3/uL — ABNORMAL HIGH (ref 0.00–0.07)
Basophils Absolute: 0 10*3/uL (ref 0.0–0.1)
Basophils Relative: 0 %
Eosinophils Absolute: 0 10*3/uL (ref 0.0–0.5)
Eosinophils Relative: 0 %
HCT: 24.1 % — ABNORMAL LOW (ref 36.0–46.0)
Hemoglobin: 7.7 g/dL — ABNORMAL LOW (ref 12.0–15.0)
Immature Granulocytes: 1 %
Lymphocytes Relative: 2 %
Lymphs Abs: 0.2 10*3/uL — ABNORMAL LOW (ref 0.7–4.0)
MCH: 32.2 pg (ref 26.0–34.0)
MCHC: 32 g/dL (ref 30.0–36.0)
MCV: 100.8 fL — ABNORMAL HIGH (ref 80.0–100.0)
Monocytes Absolute: 0.2 10*3/uL (ref 0.1–1.0)
Monocytes Relative: 2 %
Neutro Abs: 11.4 10*3/uL — ABNORMAL HIGH (ref 1.7–7.7)
Neutrophils Relative %: 95 %
Platelets: 163 10*3/uL (ref 150–400)
RBC: 2.39 MIL/uL — ABNORMAL LOW (ref 3.87–5.11)
RDW: 15.2 % (ref 11.5–15.5)
WBC: 11.9 10*3/uL — ABNORMAL HIGH (ref 4.0–10.5)
nRBC: 0 % (ref 0.0–0.2)

## 2022-05-09 LAB — COMPREHENSIVE METABOLIC PANEL
ALT: 21 U/L (ref 0–44)
AST: 36 U/L (ref 15–41)
Albumin: 2.5 g/dL — ABNORMAL LOW (ref 3.5–5.0)
Alkaline Phosphatase: 59 U/L (ref 38–126)
Anion gap: 9 (ref 5–15)
BUN: 52 mg/dL — ABNORMAL HIGH (ref 8–23)
CO2: 25 mmol/L (ref 22–32)
Calcium: 8.3 mg/dL — ABNORMAL LOW (ref 8.9–10.3)
Chloride: 100 mmol/L (ref 98–111)
Creatinine, Ser: 1.36 mg/dL — ABNORMAL HIGH (ref 0.44–1.00)
GFR, Estimated: 39 mL/min — ABNORMAL LOW (ref >60)
Glucose, Bld: 131 mg/dL — ABNORMAL HIGH (ref 70–99)
Potassium: 4.3 mmol/L (ref 3.5–5.1)
Sodium: 134 mmol/L — ABNORMAL LOW (ref 135–145)
Total Bilirubin: 0.4 mg/dL (ref 0.3–1.2)
Total Protein: 5.6 g/dL — ABNORMAL LOW (ref 6.5–8.1)

## 2022-05-09 LAB — SEDIMENTATION RATE: Sed Rate: 3 mm/hr (ref 0–30)

## 2022-05-09 LAB — PROTIME-INR
INR: 1 (ref 0.8–1.2)
Prothrombin Time: 13.5 seconds (ref 11.4–15.2)

## 2022-05-09 LAB — FERRITIN: Ferritin: 404 ng/mL — ABNORMAL HIGH (ref 11–307)

## 2022-05-09 LAB — LACTATE DEHYDROGENASE: LDH: 200 U/L — ABNORMAL HIGH (ref 98–192)

## 2022-05-09 LAB — C-REACTIVE PROTEIN: CRP: 11 mg/dL — ABNORMAL HIGH (ref <1.0)

## 2022-05-09 LAB — APTT: aPTT: 27 seconds (ref 24–36)

## 2022-05-09 LAB — PHOSPHORUS: Phosphorus: 3.9 mg/dL (ref 2.5–4.6)

## 2022-05-09 LAB — D-DIMER, QUANTITATIVE: D-Dimer, Quant: 3.96 ug/mL-FEU — ABNORMAL HIGH (ref 0.00–0.50)

## 2022-05-09 LAB — MAGNESIUM: Magnesium: 2.2 mg/dL (ref 1.7–2.4)

## 2022-05-09 LAB — FIBRINOGEN: Fibrinogen: 417 mg/dL (ref 210–475)

## 2022-05-09 LAB — HEPARIN LEVEL (UNFRACTIONATED): Heparin Unfractionated: 0.41 IU/mL (ref 0.30–0.70)

## 2022-05-09 MED ORDER — ENOXAPARIN SODIUM 40 MG/0.4ML IJ SOSY: 40.0000 mg | PREFILLED_SYRINGE | INTRAMUSCULAR | Status: DC

## 2022-05-09 MED ORDER — HEPARIN (PORCINE) 25000 UT/250ML-% IV SOLN
1150.0000 [IU]/h | INTRAVENOUS | Status: DC
  Administered 2022-05-09 – 2022-05-10 (×2): 1150 [IU]/h via INTRAVENOUS
  Filled 2022-05-09 (×2): qty 250

## 2022-05-09 MED ORDER — HEPARIN BOLUS VIA INFUSION
4000.0000 [IU] | Freq: Once | INTRAVENOUS | Status: AC
  Administered 2022-05-09: 4000 [IU] via INTRAVENOUS
  Filled 2022-05-09: qty 4000

## 2022-05-09 NOTE — Progress Notes (Signed)
OT Cancellation Note  Patient Details Name: Sydney Gillespie MRN: [id number] DOB: 02/25/1941   Cancelled Treatment:    Reason Eval/Treat Not Completed: Other (comment) (per MD hold at this time, started heparin infusion this morning for  DVT. OT will reattempt as able)  Shanon Payor, OTD OTR/L  05/09/22, 8:32 AM

## 2022-05-09 NOTE — Progress Notes (Addendum)
PHARMACIST - PHYSICIAN COMMUNICATION  CONCERNING:  Enoxaparin (Lovenox) for DVT Prophylaxis    RECOMMENDATION: Patient was prescribed enoxaparin '30mg'$  q24 hours for VTE prophylaxis.   Filed Weights   05/06/22 1444 05/06/22 1447  Weight: 69.4 kg (153 lb) 69.4 kg (153 lb)    Body mass index is 25.46 kg/m.  Estimated Creatinine Clearance: 31.8 mL/min (A) (by C-G formula based on SCr of 1.36 mg/dL (H)).   Patient is candidate for enoxaparin '40mg'$  every 24 hours based on CrCl >62m/min and Weight >45kg  DESCRIPTION: [facility]  Patient is now receiving enoxaparin 40 mg every 24 hours   ABenita Gutter12/30/2023 7:45 AM

## 2022-05-09 NOTE — Consult Note (Signed)
ANTICOAGULATION CONSULT NOTE  Pharmacy Consult for IV Heparin Indication: DVT  Patient Measurements: Height: _0  (165.1 cm) Weight: 69.4 kg (153 lb) IBW/kg (Calculated) : 57 Heparin Dosing Weight: 69.4  Labs: Recent Labs    05/06/22 1932 05/07/22 1057 05/07/22 1057 05/08/22 0648 05/08/22 1203 05/09/22 0624 05/09/22 1806  HGB  --  8.0*   < >  --  8.0* 7.7*  --   HCT  --  25.5*  --   --  26.3* 24.1*  --   PLT  --  190  --   --  179 163  --   APTT  --   --   --   --   --  27  --   LABPROT  --   --   --   --   --  13.5  --   INR  --   --   --   --   --  1.0  --   HEPARINUNFRC  --   --   --   --   --   --  0.41  CREATININE  --  1.60*  --  1.48*  --  1.36*  --   TROPONINIHS 30*  --   --   --   --   --   --    < > = values in this interval not displayed.     Estimated Creatinine Clearance: 31.8 mL/min (A) (by C-G formula based on SCr of 1.36 mg/dL (H)).   Medical History: Past Medical History:  Diagnosis Date   Allergic rhinitis    Allergy    Anemia    Anemia    Barrett's esophagus    Blood dyscrasia    multiple myloma remission   Cancer ([place])    Change in bowel habits    Compression fracture 2013   Degenerative disc disease, lumbar    Dysrhythmia    palpitations   Esophageal reflux    Family history of adverse reaction to anesthesia    nausea -mom   GERD (gastroesophageal reflux disease)    Gout    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[place])    Heart palpitations    Hyperlipidemia    Hypothyroidism    Inflammatory polyarthropathy (HCC)    Lumbago    Lumbar radiculitis    Lumbar stenosis with neurogenic claudication    Multiple myeloma (HCC)    Multiple myeloma (HCC)    Osteopenia    Osteoporosis    Other dysphagia    Palpitations    Positive PPD    Renal insufficiency    Rheumatoid arthritis (HCC)    Rheumatoid arthritis (HCC)    Seasonal allergies    Vertigo     Medications:  No anticoagulation prior to admission per my chart review. Patient has been  receiving prophylactic dosed Lovenox while inpatient  Assessment: Patient is an 81 y/o F with medical history as above and including multiple myeloma and recent COVID-19 infection who is admitted with pneumonia. Doppler of lower extremities showed "nonocclusive thrombus in one of the posterior tibial veins of the right calf". Pharmacy consulted to initiate and manage heparin infusion for DVT treatment.   Baseline aPTT and PT-INR pending. CBC notable for anemia which appears chronic.  Goal of Therapy:  Heparin level 0.3-0.7 units/ml Monitor platelets by anticoagulation protocol: Yes   Plan:  --Continue heparin infusion at 1150 units/hr --HL 8 hours from previous level --Daily CBC per protocol  Wynelle Cleveland 05/09/2022,6:31 PM

## 2022-05-09 NOTE — Consult Note (Signed)
ANTICOAGULATION CONSULT NOTE  Pharmacy Consult for IV Heparin Indication: DVT  Patient Measurements: Height: 5' 5" (165.1 cm) Weight: 69.4 kg (153 lb) IBW/kg (Calculated) : 57 Heparin Dosing Weight: 69.4  Labs: Recent Labs    05/06/22 1449 05/06/22 1932 05/07/22 1057 05/08/22 0648 05/08/22 1203 05/09/22 0624  HGB 9.0*  --  8.0*  --  8.0* 7.7*  HCT 28.8*  --  25.5*  --  26.3* 24.1*  PLT 209  --  190  --  179 163  CREATININE 1.45*  --  1.60* 1.48*  --  1.36*  TROPONINIHS 29* 30*  --   --   --   --     Estimated Creatinine Clearance: 31.8 mL/min (A) (by C-G formula based on SCr of 1.36 mg/dL (H)).   Medical History: Past Medical History:  Diagnosis Date   Allergic rhinitis    Allergy    Anemia    Anemia    Barrett's esophagus    Blood dyscrasia    multiple myloma remission   Cancer (HCC)    Change in bowel habits    Compression fracture 2013   Degenerative disc disease, lumbar    Dysrhythmia    palpitations   Esophageal reflux    Family history of adverse reaction to anesthesia    nausea -mom   GERD (gastroesophageal reflux disease)    Gout    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HCC)    Heart palpitations    Hyperlipidemia    Hypothyroidism    Inflammatory polyarthropathy (HCC)    Lumbago    Lumbar radiculitis    Lumbar stenosis with neurogenic claudication    Multiple myeloma (HCC)    Multiple myeloma (HCC)    Osteopenia    Osteoporosis    Other dysphagia    Palpitations    Positive PPD    Renal insufficiency    Rheumatoid arthritis (HCC)    Rheumatoid arthritis (HCC)    Seasonal allergies    Vertigo     Medications:  No anticoagulation prior to admission per my chart review. Patient has been receiving prophylactic dosed Lovenox while inpatient  Assessment: Patient is an 81 y/o F with medical history as above and including multiple myeloma and recent COVID-19 infection who is admitted with pneumonia. Doppler of lower extremities showed "nonocclusive  thrombus in one of the posterior tibial veins of the right calf". Pharmacy consulted to initiate and manage heparin infusion for DVT treatment.   Baseline aPTT and PT-INR pending. CBC notable for anemia which appears chronic.  Goal of Therapy:  Heparin level 0.3-0.7 units/ml Monitor platelets by anticoagulation protocol: Yes   Plan:  --Heparin 4000 unit IV bolus followed by continuous infusion at 1150 units/hr --HL 8 hours from initiation --Daily CBC per protocol   B  05/09/2022,8:15 AM 

## 2022-05-09 NOTE — Progress Notes (Signed)
PT Cancellation Note  Patient Details Name: Sydney Gillespie MRN: [id number] DOB: 1940/12/21   Cancelled Treatment:    Reason Eval/Treat Not Completed: Patient not medically ready PT orders received, chart reviewed. Pt noted to have new RLE DVT & attending planning to consult with neurosurgery 2/2 new thoracic spine fx. Pt also with new orders for heparin drip beginning at 10AM on 05/09/22. MD recommending holding PT evaluation at this time. Will f/u as able & as pt is medically appropriate.  Lavone Nian, PT, DPT 05/09/22, 8:51 AM   [name] 05/09/2022, 8:50 AM

## 2022-05-09 NOTE — Consult Note (Signed)
Consulting Department:  Medicine  Primary Physician:  [name], MD  Chief Complaint: Compression fracture  History of Present Illness: 05/09/2022 Sydney Gillespie is a 81 y.o. female who presents with the chief complaint of compression fracture.  She is currently admitted for a COVID infection and respiratory issues.  She has been coughing significantly.  She has had back pain.  She has had a previous compression fracture treated conservatively.  She has a brace at bedside.  She has not had any new numbness or tingling down her legs.  No weakness.  No new bowel or bladder symptoms.  Does have some midline low back pain and mid back pain as well.  Review of Systems:  A 10 point review of systems is negative, except for the pertinent positives and negatives detailed in the HPI.  Past Medical History: Past Medical History:  Diagnosis Date   Allergic rhinitis    Allergy    Anemia    Anemia    Barrett's esophagus    Blood dyscrasia    multiple myloma remission   Cancer ([place])    Change in bowel habits    Compression fracture 2013   Degenerative disc disease, lumbar    Dysrhythmia    palpitations   Esophageal reflux    Family history of adverse reaction to anesthesia    nausea -mom   GERD (gastroesophageal reflux disease)    Gout    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[place])    Heart palpitations    Hyperlipidemia    Hypothyroidism    Inflammatory polyarthropathy (HCC)    Lumbago    Lumbar radiculitis    Lumbar stenosis with neurogenic claudication    Multiple myeloma (HCC)    Multiple myeloma ([place])    Osteopenia    Osteoporosis    Other dysphagia    Palpitations    Positive PPD    Renal insufficiency    Rheumatoid arthritis (HCC)    Rheumatoid arthritis (HCC)    Seasonal allergies    Vertigo     Past Surgical History: Past Surgical History:  Procedure Laterality Date   ABDOMINAL HYSTERECTOMY     APPENDECTOMY     BACK SURGERY     BREAST EXCISIONAL BIOPSY  Bilateral    neg   CHOLECYSTECTOMY     COLONOSCOPY WITH PROPOFOL N/A 11/12/2016   Procedure: COLONOSCOPY WITH PROPOFOL;  Surgeon: Manya Silvas, MD;  Location[facility] ENDOSCOPY;  Service: Endoscopy;  Laterality: N/A;   ESOPHAGOGASTRODUODENOSCOPY (EGD) WITH PROPOFOL N/A 11/12/2016   Procedure: ESOPHAGOGASTRODUODENOSCOPY (EGD) WITH PROPOFOL;  Surgeon: Manya Silvas, MD;  Location: [facility] ENDOSCOPY;  Service: Endoscopy;  Laterality: N/A;   FOOT SURGERY     LUMBAR LAMINECTOMY/DECOMPRESSION MICRODISCECTOMY Left 06/18/2016   Procedure: LAMINECTOMY FOR FACET/SYNOVIAL CYST LUMBAR THREE - LUMBAR FOUR LEFT;  Surgeon: Newman Pies, MD;  Location: [place];  Service: Neurosurgery;  Laterality: Left;  LAMINECTOMY FOR FACET/SYNOVIAL CYST LUMBAR THREE - LUMBAR FOUR LEFT    Allergies: Allergies as of 05/06/2022 - Review Complete 05/06/2022  Allergen Reaction Noted   Isoniazid Other (See Comments) 11/02/2014    Medications:  Current Facility-Administered Medications:    acetaminophen (TYLENOL) tablet 650 mg, 650 mg, Oral, Q6H PRN **OR** acetaminophen (TYLENOL) suppository 650 mg, 650 mg, Rectal, Q6H PRN, Athena Masse, MD   albuterol (PROVENTIL) (2.5 MG/3ML) 0.083% nebulizer solution 2.5 mg, 2.5 mg, Nebulization, Q2H PRN, Athena Masse, MD   albuterol (VENTOLIN HFA) 108 (90 Base) MCG/ACT inhaler 2 puff, 2 puff,  Inhalation, Q2H PRN, Ward, Delice Bison, DO   ALPRAZolam Duanne Moron) tablet 0.25 mg, 0.25 mg, Oral, QHS PRN, Alfredia Ferguson, Omair Latif, DO   ascorbic acid (VITAMIN C) tablet 500 mg, 500 mg, Oral, Daily, Sheikh, Omair Latif, DO, 500 mg at 05/09/22 0841   atenolol (TENORMIN) tablet 50 mg, 50 mg, Oral, Daily, Athena Masse, MD, 50 mg at 05/09/22 1024   azithromycin (ZITHROMAX) 500 mg in sodium chloride 0.9 % 250 mL IVPB, 500 mg, Intravenous, Q24H, Athena Masse, MD, Last Rate: 250 mL/hr at 05/09/22 0105, 500 mg at 05/09/22 0105   cefTRIAXone (ROCEPHIN) 2 g in sodium chloride 0.9 % 100 mL IVPB, 2 g,  Intravenous, Q24H, Athena Masse, MD, Last Rate: 200 mL/hr at 05/09/22 0030, 2 g at 05/09/22 0030   chlorpheniramine-HYDROcodone (TUSSIONEX) 10-8 MG/5ML suspension 5 mL, 5 mL, Oral, Q12H PRN, Athena Masse, MD   DULoxetine (CYMBALTA) DR capsule 20 mg, 20 mg, Oral, BID, Sheikh, Omair Latif, DO, 20 mg at 05/09/22 1024   furosemide (LASIX) injection 20 mg, 20 mg, Intravenous, BID, Tang, Alanson Puls, PA-C, 20 mg at 05/09/22 0841   guaiFENesin (MUCINEX) 12 hr tablet 600 mg, 600 mg, Oral, BID, Athena Masse, MD, 600 mg at 05/09/22 0840   [COMPLETED] heparin bolus via infusion 4,000 Units, 4,000 Units, Intravenous, Once, 4,000 Units at 05/09/22 1053 **FOLLOWED BY** heparin ADULT infusion 100 units/mL (25000 units/260m), 1,150 Units/hr, Intravenous, Continuous, CBenita Gutter RPH, Last Rate: 11.5 mL/hr at 05/09/22 1052, 1,150 Units/hr at 05/09/22 1052   HYDROcodone-acetaminophen (NORCO/VICODIN) 5-325 MG per tablet 1-2 tablet, 1-2 tablet, Oral, Q4H PRN, DAthena Masse MD, 2 tablet at 05/09/22 1024   hydroxychloroquine (PLAQUENIL) tablet 200 mg, 200 mg, Oral, 2 times per day on Mon Tue Wed Thu Fri, 200 mg at 05/08/22 2120 **AND** hydroxychloroquine (PLAQUENIL) tablet 200 mg, 200 mg, Oral, Once per day on Sun Sat, Chappell, Alex B, RPH, 200 mg at 05/09/22 1025   Ipratropium-Albuterol (COMBIVENT) respimat 1 puff, 1 puff, Inhalation, Q6H, KVira Blanco RPH, 1 puff at 05/09/22 0841   levothyroxine (SYNTHROID) tablet 112 mcg, 112 mcg, Oral, Q0600, DAthena Masse MD, 112 mcg at 05/09/22 0534   methylPREDNISolone sodium succinate (SOLU-MEDROL) 125 mg/2 mL injection 69.375 mg, 1 mg/kg, Intravenous, Q12H, SRaiford NobleLatif, DO, 69.375 mg at 05/09/22 1046   mirabegron ER (MYRBETRIQ) tablet 50 mg, 50 mg, Oral, Daily, Sheikh, Omair Latif, DO, 50 mg at 05/09/22 1024   morphine (PF) 2 MG/ML injection 2 mg, 2 mg, Intravenous, Q2H PRN, DAthena Masse MD, 2 mg at 05/07/22 2139   ondansetron (ZOFRAN) tablet 4  mg, 4 mg, Oral, Q6H PRN **OR** ondansetron (ZOFRAN) injection 4 mg, 4 mg, Intravenous, Q6H PRN, DAthena Masse MD   oxybutynin (DITROPAN-XL) 24 hr tablet 10 mg, 10 mg, Oral, Daily, Sheikh, Omair Latif, DO, 10 mg at 05/09/22 1024   pantoprazole (PROTONIX) EC tablet 40 mg, 40 mg, Oral, BID AC, Sheikh, Omair Latif, DO, 40 mg at 05/09/22 0841   [COMPLETED] remdesivir 200 mg in sodium chloride 0.9% 250 mL IVPB, 200 mg, Intravenous, Once, Last Rate: 580 mL/hr at 05/09/22 0301, 200 mg at 05/09/22 0301 **FOLLOWED BY** [START ON 05/10/2022] remdesivir 100 mg in sodium chloride 0.9 % 100 mL IVPB, 100 mg, Intravenous, Daily, Belue, NAlver Sorrow RPH   rOPINIRole (REQUIP) tablet 0.75 mg, 0.75 mg, Oral, QHS PRN, Sheikh, Omair Latif, DO   tiZANidine (ZANAFLEX) tablet 2 mg, 2 mg, Oral, QHS, Sheikh, OBothell West DO,  2 mg at 05/08/22 2120   zinc sulfate capsule 220 mg, 220 mg, Oral, Daily, Raiford Noble [place], [place], 220 mg at 05/09/22 9528   Social History: Social History   Tobacco Use   Smoking status: Never   Smokeless tobacco: Never  Vaping Use   Vaping Use: Never used  Substance Use Topics   Alcohol use: No   Drug use: No    Family Medical History: Family History  Problem Relation Age of Onset   Hypertension Mother    Heart attack Mother    Rheum arthritis Mother    Osteoarthritis Mother    Cancer Father    Hypertension Father    Stroke Father    Breast cancer Maternal Grandmother 8   Osteoarthritis Other    Rheum arthritis Other     Physical Examination: Vitals:   05/09/22 0457 05/09/22 0832  BP: (!) 144/70 (!) 151/90  Pulse: 64 76  Resp: 18 18  Temp: 97.6 F (36.4 C) 97.8 F (36.6 C)  SpO2: 93% 98%     General: Patient is well developed, well nourished, calm, collected, and in no apparent distress.  NEUROLOGICAL:  General: In no acute distress.   Awake, alert, oriented to person, place, and time.  Pupils equal round and reactive to light.  Facial tone is symmetric.  Tongue  protrusion is midline.  There is no pronator drift.  She does have pain to midline palpation  Strength:  Side Iliopsoas Quads Hamstring PF DF EHL  R _0 L _1 Bilateral upper and lower extremity sensation is intact to light touch.  Clonus is not present.  Toes are down-going.     Imaging: IMPRESSION: 1. Acute compression deformity of T9 with approximately 40% loss of height. No retropulsion. No listhesis. Posterior elements appear intact. 2. Stable remote compression deformities of T7 and T12 with mild loss of height. 3. Additional thoracic findings are better described on accompanying CT examination of the chest.     Electronically Signed   By: Fidela Salisbury M.D.   On: 05/07/2022 00:21    I have personally reviewed the images and agree with the above interpretation.    Labs:    Latest Ref Rng & Units 05/09/2022    6:24 AM 05/08/2022   12:03 PM 05/07/2022   10:57 AM  CBC  WBC 4.0 - 10.5 K/uL 11.9  17.0  16.7   Hemoglobin 12.0 - 15.0 g/dL 7.7  8.0  8.0   Hematocrit 36.0 - 46.0 % 24.1  26.3  25.5   Platelets 150 - 400 K/uL 163  179  190        Assessment and Plan: Sydney Gillespie is a pleasant 81 y.o. female with a current COVID infection.  She has had significant coughing.  Has had new back pain.  Got a CT scan which demonstrated a acute T9 compression fracture and history of previous thoracic compression fractures.  Bone density does appear to be low on her CT scans.  She is not having any neurologic complaints from this issue.  She can continue to wear a TLSO brace for comfort.  When she is able and upright x-ray for evaluation would be helpful as a baseline.  Will arrange follow-up for her in clinic.  I have discussed the condition with the patient, including showing the radiographs and discussing treatment options in layman's terms.  The patient may benefit from conservative management.  Stevan Born, MD/MSCR Dept. of Neurosurgery

## 2022-05-09 NOTE — Progress Notes (Signed)
[facility] Cardiology  SUBJECTIVE: Patient laying in bed, eating breakfast, reports feeling better, with less shortness of breath   Vitals:   05/08/22 2010 05/09/22 0123 05/09/22 0457 05/09/22 0832  BP: (!) 141/65 135/72 (!) 144/70 (!) 151/90  Pulse: 72 68 64 76  Resp: _0 Temp: 97.9 F (36.6 C) 97.6 F (36.4 C) 97.6 F (36.4 C) 97.8 F (36.6 C)  TempSrc:      SpO2: 99% 94% 93% 98%  Weight:      Height:         Intake/Output Summary (Last 24 hours) at 05/09/2022 8119 Last data filed at 05/08/2022 1838 Gross per 24 hour  Intake 240 ml  Output 750 ml  Net -510 ml      PHYSICAL EXAM  General: Well developed, well nourished, in no acute distress HEENT:  Normocephalic and atramatic Neck:  No JVD.  Lungs: Clear bilaterally to auscultation and percussion. Heart: HRRR . Normal S1 and S2 without gallops or murmurs.  Abdomen: Bowel sounds are positive, abdomen soft and non-tender  Msk:  Back normal, normal gait. Normal strength and tone for age. Extremities: No clubbing, cyanosis or edema.   Neuro: Alert and oriented X 3. Psych:  Good affect, responds appropriately   LABS: Basic Metabolic Panel: Recent Labs    05/08/22 0648 05/09/22 0624  NA 133* 134*  K 4.2 4.3  CL 102 100  CO2 20* 25  GLUCOSE 114* 131*  BUN 51* 52*  CREATININE 1.48* 1.36*  CALCIUM 8.2* 8.3*  MG 2.2 2.2  PHOS 4.6 3.9   Liver Function Tests: Recent Labs    05/08/22 0648 05/09/22 0624  AST 42* 36  ALT 20 21  ALKPHOS 60 59  BILITOT 0.5 0.4  PROT 5.7* 5.6*  ALBUMIN 2.6* 2.5*   No results for input(s): "LIPASE", "AMYLASE" in the last 72 hours. CBC: Recent Labs    05/08/22 1203 05/09/22 0624  WBC 17.0* 11.9*  NEUTROABS 16.1* 11.4*  HGB 8.0* 7.7*  HCT 26.3* 24.1*  MCV 104.0* 100.8*  PLT 179 163   Cardiac Enzymes: No results for input(s): "CKTOTAL", "CKMB", "CKMBINDEX", "TROPONINI" in the last 72 hours. BNP: Invalid input(s): "POCBNP" D-Dimer: Recent Labs    05/08/22 0648  05/09/22 0624  DDIMER 7.22* 3.96*   Hemoglobin A1C: No results for input(s): "HGBA1C" in the last 72 hours. Fasting Lipid Panel: No results for input(s): "CHOL", "HDL", "LDLCALC", "TRIG", "CHOLHDL", "LDLDIRECT" in the last 72 hours. Thyroid Function Tests: No results for input(s): "TSH", "T4TOTAL", "T3FREE", "THYROIDAB" in the last 72 hours.  Invalid input(s): "FREET3" Anemia Panel: Recent Labs    05/08/22 0648 05/09/22 0624  VITAMINB12 1,785*  --   FOLATE 11.9  --   FERRITIN 492* 404*  TIBC 160*  --   IRON 67  --   RETICCTPCT 3.0  --     DG Chest Port 1 View  Result Date: 05/09/2022 CLINICAL DATA:  Shortness of breath EXAM: PORTABLE CHEST 1 VIEW COMPARISON:  05/08/2022 and prior studies FINDINGS: Cardiomediastinal silhouette is unchanged. Bibasilar opacities/atelectasis are not significantly changed. There is no evidence of pneumothorax or large pleural effusion. No acute bony abnormalities are identified. IMPRESSION: Unchanged appearance of the chest with bibasilar opacities/atelectasis. Electronically Signed   By: Margarette Canada M.D.   On: 05/09/2022 08:41   US Venous Img Lower Bilateral (DVT)  Result Date: 05/08/2022 CLINICAL DATA:  Elevated D-dimer EXAM: BILATERAL LOWER EXTREMITY VENOUS DOPPLER ULTRASOUND TECHNIQUE: Gray-scale sonography with graded compression, as well  as color Doppler and duplex ultrasound were performed to evaluate the lower extremity deep venous systems from the level of the common femoral vein and including the common femoral, femoral, profunda femoral, popliteal and calf veins including the posterior tibial, peroneal and gastrocnemius veins when visible. The superficial great saphenous vein was also interrogated. Spectral Doppler was utilized to evaluate flow at rest and with distal augmentation maneuvers in the common femoral, femoral and popliteal veins. COMPARISON:  Ultrasound 04/25/2022 FINDINGS: RIGHT LOWER EXTREMITY Common Femoral Vein: No evidence of  thrombus. Normal compressibility, respiratory phasicity and response to augmentation. Saphenofemoral Junction: No evidence of thrombus. Normal compressibility and flow on color Doppler imaging. Profunda Femoral Vein: No evidence of thrombus. Normal compressibility and flow on color Doppler imaging. Femoral Vein: No evidence of thrombus. Normal compressibility, respiratory phasicity and response to augmentation. Popliteal Vein: No evidence of thrombus. Normal compressibility, respiratory phasicity and response to augmentation. Calf Veins: Nonocclusive thrombus in one of the posterior tibial veins. Patent peroneal vein. Superficial Great Saphenous Vein: No evidence of thrombus. Normal compressibility. Venous Reflux:  None. Other Findings:  None. LEFT LOWER EXTREMITY Common Femoral Vein: No evidence of thrombus. Normal compressibility, respiratory phasicity and response to augmentation. Saphenofemoral Junction: No evidence of thrombus. Normal compressibility and flow on color Doppler imaging. Profunda Femoral Vein: No evidence of thrombus. Normal compressibility and flow on color Doppler imaging. Femoral Vein: No evidence of thrombus. Normal compressibility, respiratory phasicity and response to augmentation. Popliteal Vein: No evidence of thrombus. Normal compressibility, respiratory phasicity and response to augmentation. Calf Veins: No evidence of thrombus. Normal compressibility and flow on color Doppler imaging. Superficial Great Saphenous Vein: No evidence of thrombus. Normal compressibility. Venous Reflux:  None. Other Findings:  None. IMPRESSION: Nonocclusive thrombus in one of the posterior tibial veins of the right calf. No evidence of DVT in the left lower extremity. Electronically Signed   By: Maurine Simmering M.D.   On: 05/08/2022 11:57   DG Chest Port 1 View  Result Date: 05/08/2022 CLINICAL DATA:  Per MD note, pneumonia EXAM: PORTABLE CHEST 1 VIEW COMPARISON:  05/06/2022 FINDINGS: Similar cardiomegaly.  Aortic atherosclerotic calcification. Bibasilar atelectasis/infiltrates. The right basilar infiltrates are compatible with pneumonia on CT 05/06/2022 though these are not well demonstrated radiographically. Pulmonary vascular congestion. Dilated gas-filled loops of bowel in the upper abdomen. Cervical spine fusion hardware. IMPRESSION: Bibasilar infiltrates/atelectasis. The right lower lobe pneumonia seen on chest CT 05/06/2022 is not well demonstrated radiographically. Cardiomegaly and pulmonary vascular congestion. Partially visualized dilated loops of bowel in the upper abdomen. If obstruction is a concern, consider CT for further evaluation. Electronically Signed   By: Placido Sou M.D.   On: 05/08/2022 00:58   ECHOCARDIOGRAM COMPLETE  Result Date: 05/07/2022    ECHOCARDIOGRAM REPORT   Patient Name:   Sydney Gillespie Date of Exam: 05/07/2022 Medical Rec #:  [id number]          Height:       65.0 in Accession #:    [id number]         Weight:       153.0 lb Date of Birth:  July 22, 1940           BSA:          1.765 m Patient Age:    81 years           BP:           139/64 mmHg Patient Gender: F  HR:           80 bpm. Exam Location:  [facility] Procedure: 2D Echo, Cardiac Doppler and Color Doppler Indications:     CHF-acute diastolic Z61.09  History:         Patient has no prior history of Echocardiogram examinations.                  Arrythmias:LBBB; Risk Factors:Dyslipidemia. Palpitations.  Sonographer:     Sherrie Sport Referring Phys:  [id number] Athena Masse Diagnosing Phys: Isaias Cowman MD  Sonographer Comments: Image quality was good. IMPRESSIONS  1. Left ventricular ejection fraction, by estimation, is 60 to 65%. The left ventricle has normal function. The left ventricle has no regional wall motion abnormalities. Left ventricular diastolic parameters are consistent with Grade I diastolic dysfunction (impaired relaxation).  2. Right ventricular systolic function is normal. The right  ventricular size is normal.  3. The mitral valve is normal in structure. Mild mitral valve regurgitation. No evidence of mitral stenosis.  4. Tricuspid valve regurgitation is mild to moderate.  5. The aortic valve is normal in structure. Aortic valve regurgitation is mild to moderate. No aortic stenosis is present.  6. The inferior vena cava is normal in size with greater than 50% respiratory variability, suggesting right atrial pressure of 3 mmHg. FINDINGS  Left Ventricle: Left ventricular ejection fraction, by estimation, is 60 to 65%. The left ventricle has normal function. The left ventricle has no regional wall motion abnormalities. The left ventricular internal cavity size was normal in size. There is  no left ventricular hypertrophy. Left ventricular diastolic parameters are consistent with Grade I diastolic dysfunction (impaired relaxation). Right Ventricle: The right ventricular size is normal. No increase in right ventricular wall thickness. Right ventricular systolic function is normal. Left Atrium: Left atrial size was normal in size. Right Atrium: Right atrial size was normal in size. Pericardium: Trivial pericardial effusion is present. Mitral Valve: The mitral valve is normal in structure. Mild mitral valve regurgitation. No evidence of mitral valve stenosis. Tricuspid Valve: The tricuspid valve is normal in structure. Tricuspid valve regurgitation is mild to moderate. No evidence of tricuspid stenosis. Aortic Valve: The aortic valve is normal in structure. Aortic valve regurgitation is mild to moderate. Aortic regurgitation PHT measures 445 msec. No aortic stenosis is present. Aortic valve mean gradient measures 4.5 mmHg. Aortic valve peak gradient measures 7.8 mmHg. Aortic valve area, by VTI measures 2.99 cm. Pulmonic Valve: The pulmonic valve was normal in structure. Pulmonic valve regurgitation is not visualized. No evidence of pulmonic stenosis. Aorta: The aortic root is normal in size and  structure. Venous: The inferior vena cava is normal in size with greater than 50% respiratory variability, suggesting right atrial pressure of 3 mmHg. IAS/Shunts: No atrial level shunt detected by color flow Doppler.  LEFT VENTRICLE PLAX 2D LVIDd:         4.50 cm   Diastology LVIDs:         2.70 cm   LV e' medial:    5.66 cm/s LV PW:         0.90 cm   LV E/e' medial:  18.6 LV IVS:        0.80 cm   LV e' lateral:   10.70 cm/s LVOT diam:     2.00 cm   LV E/e' lateral: 9.8 LV SV:         85 LV SV Index:   48 LVOT Area:     3.14 cm  RIGHT VENTRICLE RV Basal diam:  2.40 cm RV Mid diam:    2.80 cm RV S prime:     13.60 cm/s TAPSE (M-mode): 2.1 cm LEFT ATRIUM             Index        RIGHT ATRIUM          Index LA diam:        4.40 cm 2.49 cm/m   RA Area:     7.95 cm LA Vol (A2C):   87.6 ml 49.62 ml/m  RA Volume:   10.40 ml 5.89 ml/m LA Vol (A4C):   61.0 ml 34.56 ml/m LA Biplane Vol: 73.9 ml 41.86 ml/m  AORTIC VALVE AV Area (Vmax):    2.41 cm AV Area (Vmean):   2.27 cm AV Area (VTI):     2.99 cm AV Vmax:           139.50 cm/s AV Vmean:          102.350 cm/s AV VTI:            0.284 m AV Peak Grad:      7.8 mmHg AV Mean Grad:      4.5 mmHg LVOT Vmax:         107.00 cm/s LVOT Vmean:        73.900 cm/s LVOT VTI:          0.270 m LVOT/AV VTI ratio: 0.95 AI PHT:            445 msec  AORTA Ao Root diam: 3.27 cm MITRAL VALVE                TRICUSPID VALVE MV Area (PHT): 4.89 cm     TR Peak grad:   14.6 mmHg MV Decel Time: 155 msec     TR Vmax:        191.00 cm/s MV E velocity: 105.00 cm/s MV A velocity: 152.00 cm/s  SHUNTS MV E/A ratio:  0.69         Systemic VTI:  0.27 m                             Systemic Diam: 2.00 cm Isaias Cowman MD Electronically signed by Isaias Cowman MD Signature Date/Time: 05/07/2022/1:15:16 PM    Final      Echo EF 60-65% 05/07/2022  TELEMETRY: Sinus rhythm:  ASSESSMENT AND PLAN:  Principal Problem:   Acute respiratory failure with hypoxia (HCC) Active Problems:    COVID-19 virus infection   Multiple myeloma (HCC)   Hypothyroidism   Depression with anxiety   Rheumatoid arthritis (HCC)   CKD (chronic kidney disease), stage IV (HCC)   Anemia of chronic kidney failure   Compression fracture of body of thoracic vertebra (HCC)   Pneumonia involving right lung   Elevated troponin    1.  Acute on chronic diastolic congestive heart failure, HFpEF, EF 60-65% 05/07/2022, with good diuresis on furosemide 20 mg IV twice daily, with stable renal function, BUN and creatinine 52 and 1.36, respectively, net output  -510 cc 2.  Acute hypoxic respiratory failure, likely due to COVID-19 with superimposed bacterial pneumonia, on empiric IV broad-spectrum antibiotics and Solu-Medrol 3.  Borderline elevated high-sensitivity troponin (29, 30), likely demand supply ischemia due to respiratory failure with underlying CKD 4.  CKD stage IV, currently stable  Recommendations  1.  Agree with current therapy 2.  Continue diuresis 3.  Carefully  monitor renal status 4.  Consider changing to p.o. furosemide on 05/10/2022 5.  Continue current dose of atenolol 6.  Defer further cardiac diagnostics at this time     Isaias Cowman, MD, PhD, [facility] 05/09/2022 9:17 AM

## 2022-05-09 NOTE — Progress Notes (Signed)
PROGRESS NOTE    Sydney Gillespie  EQA:[id number] DOB: 12/16/1940 DOA: 05/06/2022 PCP: [name], MD   Brief Narrative:  The patient is an 81 year old Caucasian female with a past medical history significant for but not limited to multiple myeloma status post bone marrow transplantation and off of immunotherapy for 3 weeks as well as history of rheumatoid arthritis, vertigo, anemia, chronic kidney disease stage IV, hypothyroidism, gout, depression and anxiety as well as other comorbidities who recently had COVID on 04/23/2022 and presents with worsening shortness of breath. Patient has been having incessant coughing, fevers and chills and shortness of breath no chest pain. She developed mid back pain in the past 2 weeks worse on standing and walking. She states her breathing has become more difficult with the worsening pain on taking a deep breath and denies any leg swelling. EMS found her to have saturations of 85% and was improved to 98% on 2 L. She tested positive on 04/23/2022 for COVID and in the ED troponin was 30 with BNP of 1274 with no prior history of CHF. COVID was positive and WBC was elevated to 15,000. She had EKG done that showed normal sinus rhythm with nonspecific ST-T wave changes and a CTA that was done is consistent with right-sided lobar pneumonia the right lower lobe with scattered sparse infiltrates also noted with the right middle and upper lobe and also acute T9 fracture with 40% loss of height as well as stable remote superior endplate fractures of T7 and T12. The patient was given fentanyl for pain as well as albuterol and Zofran and hospitalist was consulted for further evaluation and admission    Cardiology following and she is being diuresed.  D-dimer was elevated but she is already anticoagulated and CTA was negative for PE.  Will check lower extremity venous duplex.  Will need PT and OT to further evaluate and treat and will need to discuss with neurosurgery given  that she is having some back pain.  Echocardiogram done showed diastolic dysfunction as well as mild to moderate tricuspid regurg and mild to moderate aortic regurg.  Cardiology is deferring further cardiac diagnostics and recommending continuing current diuresis with IV Lasix twice daily  Neurosurgery was consulted for her compression fracture and they recommended conservative management and back brace and follow-up in the outpatient setting.  Inflammatory markers are improving  Assessment and Plan: Acute respiratory failure with hypoxia (HCC) -Suspecting principally related to pneumonia with pulse will component of new CHF given elevated BNP, cardiomegaly and pulmonary hypertension on CT chest -SpO2: 93 % O2 Flow Rate (L/min): 2 L/min -O2 sat with EMS was 85% requiring 2 L to maintain sats in the high 90s -Continue supplemental oxygen and wean as tolerated -Treat individual etiologies as listed separately -PT and OT to further evaluate and treat   Pneumonia involving right lung COVID-19 positive since 12/14 Suspect superimposed bacterial pneumonia High risk for severe infection due to chronic immunosuppressants -Follow procalcitonin and will check inflammatory markers;  -COVID-19 Labs  Recent Labs    05/08/22 0648 05/09/22 0624  DDIMER 7.22* 3.96*  FERRITIN 492* 404*  LDH 330* 200*  CRP 19.5* 11.0*  -ESR Was 61 and now is 3 -Patient is symptomatic and still has a pneumonia and CRP is significantly elevated so we will start her on high-dose Solu-Medrol 1 mg/kg for 3 days and then changed to oral prednisone; this is day 2 of 3 of high-dose Solu-Medrol -Also start remdesivir  Lab Results  Component Value Date  SARSCOV2NAA POSITIVE (A) 05/06/2022   SARSCOV2NAA POSITIVE (A) 04/23/2022   [place] Not Detected 05/29/2020  -Procalcitonin level was 0.47 -C/w Rocephin and azithromycin for now -WBC went from 14.8 is now 16.7 and is now trended up to 17.0 yesterday but is now  11.9 -LE Venous Duplex done and showed "Nonocclusive thrombus in one of the posterior tibial veins of the right calf. No evidence of DVT in the left lower extremity." -Antitussives, incentive spirometry and supportive care -Will also add zinc and vitamin C as well as flutter valve and incentive spirometry -Albuterol as needed for wheezing -Continue with Combivent and as needed albuterol -Continue supplemental oxygen to wean as tolerated -She will need an ambulatory home O2 screen and chest x-ray in a.m.  Right leg DVT -Start heparin drip and oral anticoagulation afterwards   Multiple myeloma ([facility]) -S/p bone marrow transplant, on immunotherapy until 3 weeks ago -No acute issues suspected; resume home medications    Elevated troponin Acute diastolic CHF with preserved ejection fraction of 60 to 65% and grade 1 diastolic dysfunction -Troponin 30 with BNP 1274 -Patient has chest pain that is mostly atypical and worse when coughing, suspect related to T9 fracture -Continue to trend troponin -Echocardiogram to evaluate for segmental wall motion abnormality -Low-dose IV Lasix 20 mg twice daily and will continue for cardiology -Daily weights with intake and output monitoring -Patient is -460 mL -Cardiology consulted and recommending continue atenolol 50 mg p.o. daily as well as agree with continuing IV Lasix admitted do not feel that the minimal elevation of her troponins is related to ACS   CKD (chronic kidney disease), stage IV (HCC) Metabolic acidosis Hyperphosphatemia -Renal function and hemoglobin at baseline -BUN/Cr Trend: Recent Labs  Lab 04/25/22 0646 04/26/22 0311 04/27/22 0408 05/06/22 1449 05/07/22 1057 05/08/22 0648 05/09/22 0624  BUN 48* 54* 53* 40* 45* 51* 52*  CREATININE 2.29* 2.17* 1.97* 1.45* 1.60* 1.48* 1.36*  -Patient has a slight metabolic acidosis with a CO2 of 20, anion gap of 11, chloride level of 102 -Patient's Phos level is now 5.0 -Avoid Nephrotoxic  Medications, Contrast Dyes, Hypotension and Dehydration to Ensure Adequate Renal Perfusion and will need to Renally Adjust Meds -Continue to Monitor and Trend Renal Function carefully and repeat CMP in the AM    Rheumatoid arthritis (HCC) -Continue Plaquenil and Arava   Hypothyroidism -Continue levothyroxine and atenolol -Check TSH in the AM   Elevated AST -Mild and likely reactive in the setting of above versus COVID infection -Patient's AST went from 45 is now 49 -> 42 and now resolved at 36 -Continue monitor and trend hepatic function carefully and if necessary obtain a right upper quadrant ultrasound as well as an acute hepatitis panel -Repeat CMP in a.m.   Compression fracture of body of thoracic vertebra (HCC) -Possibly triggered by intractable coughing -Pain control and up with assistance -Acute compression deformity of T9 with approximately 40% loss of height as well as stable remote compression deformity of T7 and T12 with mild loss of height -She has a back brace -Neurosurgery consulted and her bone density is appear to be low on her CT scan and she does not have any neurologic complaints.  They are recommending continue to wear her TLSO brace for comfort and when she is able to have an upright x-ray for further evaluation for baseline and follow-up in the clinic in outpatient setting -Will order the x-ray for the morning   Hypoalbuminemia -Patient's albumin level is now trended down from 3.2 is  now 2.8 -> 2.6 and is now 2.5 -Continue monitor and trend and repeat CMP in a.m.   Anemia of chronic kidney failure Stage IV/macrocytic anemia -Hgb/Hct Trend: Recent Labs  Lab 04/25/22 0646 04/26/22 0311 04/27/22 0408 05/06/22 1449 05/07/22 1057 05/08/22 1203 05/09/22 0624  HGB 9.1* 9.0* 8.2* 9.0* 8.0* 8.0* 7.7*  HCT 29.1* 28.6* 25.8* 28.8* 25.5* 26.3* 24.1*  MCV 103.2* 102.9* 99.6 104.0* 103.2* 104.0* 100.8*  -Anemia panel was checked and showed an iron level of 67, UIBC  104, TIBC 160, saturation ratios of 35%, ferritin 492, folate level 11.9, vitamin B12 1785 -Continue to monitor for signs and symptoms of bleeding; no overt bleeding noted -Use for hemoglobin less than 7 -Repeat CBC in a.m.   Hyponatremia -Patient's sodium trend Recent Labs  Lab 04/25/22 0646 04/26/22 0311 04/27/22 0408 05/06/22 1449 05/07/22 1057 05/08/22 0648 05/09/22 0624  NA 132* 132* 131* 134* 134* 133* 134*  -Continue to monitor and trend and repeat CMP in the a.m  DVT prophylaxis: Anticoagulated with a heparin drip    Code Status: Full Code Family Communication: No family currently at bedside  Disposition Plan:  Level of care: Progressive Status is: Inpatient Remains inpatient appropriate because: Needs further clinical improvement   Consultants:  Cardiology Neurosurgery  Procedures:  As delineated as above  Antimicrobials:  Anti-infectives (From admission, onward)    Start     Dose/Rate Route Frequency Ordered Stop   05/10/22 1000  remdesivir 100 mg in sodium chloride 0.9 % 100 mL IVPB       See Hyperspace for full Linked Orders Report.   100 mg 200 mL/hr over 30 Minutes Intravenous Daily 05/08/22 2218 05/12/22 0959   05/09/22 1000  hydroxychloroquine (PLAQUENIL) tablet 200 mg       See Hyperspace for full Linked Orders Report.   200 mg Oral Once per day on Sun Sat 05/07/22 1458     05/09/22 0200  remdesivir 200 mg in sodium chloride 0.9% 250 mL IVPB       See Hyperspace for full Linked Orders Report.   200 mg 580 mL/hr over 30 Minutes Intravenous Once 05/08/22 2218 05/09/22 0331   05/07/22 2200  hydroxychloroquine (PLAQUENIL) tablet 200 mg       See Hyperspace for full Linked Orders Report.   200 mg Oral 2 times per day on Mon Tue Wed Thu Fri 05/07/22 1458     05/07/22 1000  hydroxychloroquine (PLAQUENIL) tablet 200 mg  Status:  Discontinued        200 mg Oral 2 times daily 05/07/22 0118 05/07/22 1457   05/07/22 0300  ceFEPIme (MAXIPIME) 2 g in sodium  chloride 0.9 % 100 mL IVPB        2 g 200 mL/hr over 30 Minutes Intravenous  Once 05/07/22 0250 05/07/22 0544   05/07/22 0130  cefTRIAXone (ROCEPHIN) 2 g in sodium chloride 0.9 % 100 mL IVPB        2 g 200 mL/hr over 30 Minutes Intravenous Every 24 hours 05/07/22 0118 05/12/22 0129   05/07/22 0130  azithromycin (ZITHROMAX) 500 mg in sodium chloride 0.9 % 250 mL IVPB        500 mg 250 mL/hr over 60 Minutes Intravenous Every 24 hours 05/07/22 0118 05/12/22 0129       Subjective: Seen and examined at bedside and was doing better.  Continue have some slight back pain.  No nausea or vomiting.  Respiratory status is stable and has been weaned off of oxygen.  Surprised that she has a DVT  Objective: Vitals:   05/09/22 0123 05/09/22 0457 05/09/22 0832 05/09/22 1705  BP: 135/72 (!) 144/70 (!) 151/90 (!) 132/59  Pulse: 68 64 76 70  Resp: _0 Temp: 97.6 F (36.4 C) 97.6 F (36.4 C) 97.8 F (36.6 C) 98.2 F (36.8 C)  TempSrc:      SpO2: 94% 93% 98% 93%  Weight:      Height:        Intake/Output Summary (Last 24 hours) at 05/09/2022 2004 Last data filed at 05/09/2022 1705 Gross per 24 hour  Intake --  Output 300 ml  Net -300 ml   Filed Weights   05/06/22 1444 05/06/22 1447  Weight: 69.4 kg 69.4 kg   Examination: Physical Exam:  Constitutional: WN/WD overweight Caucasian female currently no acute distress  Respiratory: Diminished to auscultation bilaterally with coarse breath sounds also some crackles, no wheezing, rales, rhonchi. Normal respiratory effort and patient is not tachypenic. No accessory muscle use.  Unlabored breathing Cardiovascular: RRR, no murmurs / rubs / gallops. S1 and S2 auscultated. No extremity edema. Abdomen: Soft, non-tender, slightly distended. Bowel sounds positive.  GU: Deferred. Musculoskeletal: No clubbing / cyanosis of digits/nails. No joint deformity upper and lower extremities. Skin: No rashes, lesions, ulcers on limited skin evaluation.  No induration; Warm and dry.  Neurologic: CN 2-12 grossly intact with no focal deficits.  Romberg sign and cerebellar reflexes not assessed.  Psychiatric: Normal judgment and insight. Alert and oriented x 3. Normal mood and appropriate affect.   Data Reviewed: I have personally reviewed following labs and imaging studies  CBC: Recent Labs  Lab 05/06/22 1449 05/07/22 1057 05/08/22 1203 05/09/22 0624  WBC 14.8* 16.7* 17.0* 11.9*  NEUTROABS  --  16.2* 16.1* 11.4*  HGB 9.0* 8.0* 8.0* 7.7*  HCT 28.8* 25.5* 26.3* 24.1*  MCV 104.0* 103.2* 104.0* 100.8*  PLT 209 190 179 588   Basic Metabolic Panel: Recent Labs  Lab 05/06/22 1449 05/07/22 1057 05/08/22 0648 05/09/22 0624  NA 134* 134* 133* 134*  K 4.4 4.8 4.2 4.3  CL 100 101 102 100  CO2 23 21* 20* 25  GLUCOSE 102* 105* 114* 131*  BUN 40* 45* 51* 52*  CREATININE 1.45* 1.60* 1.48* 1.36*  CALCIUM 9.1 8.6* 8.2* 8.3*  MG  --  2.1 2.2 2.2  PHOS  --  5.0* 4.6 3.9   GFR: Estimated Creatinine Clearance: 31.8 mL/min (A) (by C-G formula based on SCr of 1.36 mg/dL (H)). Liver Function Tests: Recent Labs  Lab 05/06/22 1449 05/07/22 1057 05/08/22 0648 05/09/22 0624  AST 45* 49* 42* 36  ALT _1 ALKPHOS 64 62 60 59  BILITOT 1.0 1.1 0.5 0.4  PROT 6.7 6.1* 5.7* 5.6*  ALBUMIN 3.2* 2.8* 2.6* 2.5*   No results for input(s): "LIPASE", "AMYLASE" in the last 168 hours. No results for input(s): "AMMONIA" in the last 168 hours. Coagulation Profile: Recent Labs  Lab 05/09/22 0624  INR 1.0   Cardiac Enzymes: No results for input(s): "CKTOTAL", "CKMB", "CKMBINDEX", "TROPONINI" in the last 168 hours. BNP (last 3 results) No results for input(s): "PROBNP" in the last 8760 hours. HbA1C: No results for input(s): "HGBA1C" in the last 72 hours. CBG: No results for input(s): "GLUCAP" in the last 168 hours. Lipid Profile: No results for input(s): "CHOL", "HDL", "LDLCALC", "TRIG", "CHOLHDL", "LDLDIRECT" in the last 72  hours. Thyroid Function Tests: No results for input(s): "TSH", "T4TOTAL", "FREET4", "T3FREE", "THYROIDAB" in  the last 72 hours. Anemia Panel: Recent Labs    05/08/22 0648 05/09/22 0624  VITAMINB12 1,785*  --   FOLATE 11.9  --   FERRITIN 492* 404*  TIBC 160*  --   IRON 67  --   RETICCTPCT 3.0  --    Sepsis Labs: Recent Labs  Lab 05/06/22 1932  PROCALCITON 0.47    Recent Results (from the past 240 hour(s))  Resp panel by RT-PCR (RSV, Flu A&B, Covid) Anterior Nasal Swab     Status: Abnormal   Collection Time: 05/06/22  2:52 PM   Specimen: Anterior Nasal Swab  Result Value Ref Range Status   SARS Coronavirus 2 by RT PCR POSITIVE (A) NEGATIVE Final    Comment: (NOTE) SARS-CoV-2 target nucleic acids are DETECTED.  The SARS-CoV-2 RNA is generally detectable in upper respiratory specimens during the acute phase of infection. Positive results are indicative of the presence of the identified virus, but do not rule out bacterial infection or co-infection with other pathogens not detected by the test. Clinical correlation with patient history and other diagnostic information is necessary to determine patient infection status. The expected result is Negative.  Fact Sheet for Patients: EntrepreneurPulse.com.au  Fact Sheet for Healthcare Providers: IncredibleEmployment.be  This test is not yet approved or cleared by the [place] FDA and  has been authorized for detection and/or diagnosis of SARS-CoV-2 by FDA under an Emergency Use Authorization (EUA).  This EUA will remain in effect (meaning this test can be used) for the duration of  the COVID-19 declaration under Section 564(b)(1) of the A ct, 21 U.S.C. section 360bbb-3(b)(1), unless the authorization is terminated or revoked sooner.     Influenza A by PCR NEGATIVE NEGATIVE Final   Influenza B by PCR NEGATIVE NEGATIVE Final    Comment: (NOTE) The Xpert Xpress SARS-CoV-2/FLU/RSV plus  assay is intended as an aid in the diagnosis of influenza from Nasopharyngeal swab specimens and should not be used as a sole basis for treatment. Nasal washings and aspirates are unacceptable for Xpert Xpress SARS-CoV-2/FLU/RSV testing.  Fact Sheet for Patients: EntrepreneurPulse.com.au  Fact Sheet for Healthcare Providers: IncredibleEmployment.be  This test is not yet approved or cleared by the [place] FDA and has been authorized for detection and/or diagnosis of SARS-CoV-2 by FDA under an Emergency Use Authorization (EUA). This EUA will remain in effect (meaning this test can be used) for the duration of the COVID-19 declaration under Section 564(b)(1) of the Act, 21 U.S.C. section 360bbb-3(b)(1), unless the authorization is terminated or revoked.     Resp Syncytial Virus by PCR NEGATIVE NEGATIVE Final    Comment: (NOTE) Fact Sheet for Patients: EntrepreneurPulse.com.au  Fact Sheet for Healthcare Providers: IncredibleEmployment.be  This test is not yet approved or cleared by the [place] FDA and has been authorized for detection and/or diagnosis of SARS-CoV-2 by FDA under an Emergency Use Authorization (EUA). This EUA will remain in effect (meaning this test can be used) for the duration of the COVID-19 declaration under Section 564(b)(1) of the Act, 21 U.S.C. section 360bbb-3(b)(1), unless the authorization is terminated or revoked.  Performed at [facility], [address], [place], [place] [address]      Radiology Studies: DG Chest [place] 1 View  Result Date: 05/09/2022 CLINICAL DATA:  Shortness of breath EXAM: PORTABLE CHEST 1 VIEW COMPARISON:  05/08/2022 and prior studies FINDINGS: Cardiomediastinal silhouette is unchanged. Bibasilar opacities/atelectasis are not significantly changed. There is no evidence of pneumothorax or large pleural effusion. No acute  bony  abnormalities are identified. IMPRESSION: Unchanged appearance of the chest with bibasilar opacities/atelectasis. Electronically Signed   By: Margarette Canada M.D.   On: 05/09/2022 08:41   US Venous Img Lower Bilateral (DVT)  Result Date: 05/08/2022 CLINICAL DATA:  Elevated D-dimer EXAM: BILATERAL LOWER EXTREMITY VENOUS DOPPLER ULTRASOUND TECHNIQUE: Gray-scale sonography with graded compression, as well as color Doppler and duplex ultrasound were performed to evaluate the lower extremity deep venous systems from the level of the common femoral vein and including the common femoral, femoral, profunda femoral, popliteal and calf veins including the posterior tibial, peroneal and gastrocnemius veins when visible. The superficial great saphenous vein was also interrogated. Spectral Doppler was utilized to evaluate flow at rest and with distal augmentation maneuvers in the common femoral, femoral and popliteal veins. COMPARISON:  Ultrasound 04/25/2022 FINDINGS: RIGHT LOWER EXTREMITY Common Femoral Vein: No evidence of thrombus. Normal compressibility, respiratory phasicity and response to augmentation. Saphenofemoral Junction: No evidence of thrombus. Normal compressibility and flow on color Doppler imaging. Profunda Femoral Vein: No evidence of thrombus. Normal compressibility and flow on color Doppler imaging. Femoral Vein: No evidence of thrombus. Normal compressibility, respiratory phasicity and response to augmentation. Popliteal Vein: No evidence of thrombus. Normal compressibility, respiratory phasicity and response to augmentation. Calf Veins: Nonocclusive thrombus in one of the posterior tibial veins. Patent peroneal vein. Superficial Great Saphenous Vein: No evidence of thrombus. Normal compressibility. Venous Reflux:  None. Other Findings:  None. LEFT LOWER EXTREMITY Common Femoral Vein: No evidence of thrombus. Normal compressibility, respiratory phasicity and response to augmentation. Saphenofemoral  Junction: No evidence of thrombus. Normal compressibility and flow on color Doppler imaging. Profunda Femoral Vein: No evidence of thrombus. Normal compressibility and flow on color Doppler imaging. Femoral Vein: No evidence of thrombus. Normal compressibility, respiratory phasicity and response to augmentation. Popliteal Vein: No evidence of thrombus. Normal compressibility, respiratory phasicity and response to augmentation. Calf Veins: No evidence of thrombus. Normal compressibility and flow on color Doppler imaging. Superficial Great Saphenous Vein: No evidence of thrombus. Normal compressibility. Venous Reflux:  None. Other Findings:  None. IMPRESSION: Nonocclusive thrombus in one of the posterior tibial veins of the right calf. No evidence of DVT in the left lower extremity. Electronically Signed   By: Maurine Simmering M.D.   On: 05/08/2022 11:57   DG Chest [place] 1 View  Result Date: 05/08/2022 CLINICAL DATA:  Per MD note, pneumonia EXAM: PORTABLE CHEST 1 VIEW COMPARISON:  05/06/2022 FINDINGS: Similar cardiomegaly. Aortic atherosclerotic calcification. Bibasilar atelectasis/infiltrates. The right basilar infiltrates are compatible with pneumonia on CT 05/06/2022 though these are not well demonstrated radiographically. Pulmonary vascular congestion. Dilated gas-filled loops of bowel in the upper abdomen. Cervical spine fusion hardware. IMPRESSION: Bibasilar infiltrates/atelectasis. The right lower lobe pneumonia seen on chest CT 05/06/2022 is not well demonstrated radiographically. Cardiomegaly and pulmonary vascular congestion. Partially visualized dilated loops of bowel in the upper abdomen. If obstruction is a concern, consider CT for further evaluation. Electronically Signed   By: Placido Sou M.D.   On: 05/08/2022 00:58    Scheduled Meds:  ascorbic acid  500 mg Oral Daily   atenolol  50 mg Oral Daily   DULoxetine  20 mg Oral BID   furosemide  20 mg Intravenous BID   guaiFENesin  600 mg Oral BID    hydroxychloroquine  200 mg Oral 2 times per day on Mon Tue Wed Thu Fri   And   hydroxychloroquine  200 mg Oral Once per day on Sun Sat   Ipratropium-Albuterol  1 puff Inhalation Q6H   levothyroxine  112 mcg Oral Q0600   methylPREDNISolone (SOLU-MEDROL) injection  1 mg/kg Intravenous Q12H   mirabegron ER  50 mg Oral Daily   oxybutynin  10 mg Oral Daily   pantoprazole  40 mg Oral BID AC   tiZANidine  2 mg Oral QHS   zinc sulfate  220 mg Oral Daily   Continuous Infusions:  azithromycin 500 mg (05/09/22 0105)   cefTRIAXone (ROCEPHIN)  IV 2 g (05/09/22 0030)   heparin 1,150 Units/hr (05/09/22 1052)   [START ON 05/10/2022] remdesivir 100 mg in sodium chloride 0.9 % 100 mL IVPB      LOS: 2 days   Raiford Noble, DO [facility] Available via Epic secure chat 7am-7pm After these hours, please refer to coverage provider listed on [email or web address] 05/09/2022, 8:04 PM

## 2022-05-10 ENCOUNTER — Inpatient Hospital Stay: Payer: Medicare Other

## 2022-05-10 DIAGNOSIS — I82461 Acute embolism and thrombosis of right calf muscular vein: Secondary | ICD-10-CM | POA: Diagnosis present

## 2022-05-10 DIAGNOSIS — J9601 Acute respiratory failure with hypoxia: Secondary | ICD-10-CM | POA: Diagnosis not present

## 2022-05-10 DIAGNOSIS — U071 COVID-19: Secondary | ICD-10-CM | POA: Diagnosis not present

## 2022-05-10 DIAGNOSIS — J189 Pneumonia, unspecified organism: Secondary | ICD-10-CM | POA: Diagnosis not present

## 2022-05-10 DIAGNOSIS — S22000A Wedge compression fracture of unspecified thoracic vertebra, initial encounter for closed fracture: Secondary | ICD-10-CM | POA: Diagnosis not present

## 2022-05-10 LAB — COMPREHENSIVE METABOLIC PANEL
ALT: 18 U/L (ref 0–44)
AST: 28 U/L (ref 15–41)
Albumin: 2.5 g/dL — ABNORMAL LOW (ref 3.5–5.0)
Alkaline Phosphatase: 55 U/L (ref 38–126)
Anion gap: 11 (ref 5–15)
BUN: 54 mg/dL — ABNORMAL HIGH (ref 8–23)
CO2: 24 mmol/L (ref 22–32)
Calcium: 8.1 mg/dL — ABNORMAL LOW (ref 8.9–10.3)
Chloride: 97 mmol/L — ABNORMAL LOW (ref 98–111)
Creatinine, Ser: 1.43 mg/dL — ABNORMAL HIGH (ref 0.44–1.00)
GFR, Estimated: 37 mL/min — ABNORMAL LOW (ref >60)
Glucose, Bld: 130 mg/dL — ABNORMAL HIGH (ref 70–99)
Potassium: 3.9 mmol/L (ref 3.5–5.1)
Sodium: 132 mmol/L — ABNORMAL LOW (ref 135–145)
Total Bilirubin: 0.6 mg/dL (ref 0.3–1.2)
Total Protein: 5.4 g/dL — ABNORMAL LOW (ref 6.5–8.1)

## 2022-05-10 LAB — CBC WITH DIFFERENTIAL/PLATELET
Abs Immature Granulocytes: 0.08 10*3/uL — ABNORMAL HIGH (ref 0.00–0.07)
Basophils Absolute: 0 10*3/uL (ref 0.0–0.1)
Basophils Relative: 0 %
Eosinophils Absolute: 0 10*3/uL (ref 0.0–0.5)
Eosinophils Relative: 0 %
HCT: 23.6 % — ABNORMAL LOW (ref 36.0–46.0)
Hemoglobin: 7.5 g/dL — ABNORMAL LOW (ref 12.0–15.0)
Immature Granulocytes: 1 %
Lymphocytes Relative: 2 %
Lymphs Abs: 0.2 10*3/uL — ABNORMAL LOW (ref 0.7–4.0)
MCH: 31.9 pg (ref 26.0–34.0)
MCHC: 31.8 g/dL (ref 30.0–36.0)
MCV: 100.4 fL — ABNORMAL HIGH (ref 80.0–100.0)
Monocytes Absolute: 0.3 10*3/uL (ref 0.1–1.0)
Monocytes Relative: 2 %
Neutro Abs: 11.6 10*3/uL — ABNORMAL HIGH (ref 1.7–7.7)
Neutrophils Relative %: 95 %
Platelets: 157 10*3/uL (ref 150–400)
RBC: 2.35 MIL/uL — ABNORMAL LOW (ref 3.87–5.11)
RDW: 15 % (ref 11.5–15.5)
WBC: 12.2 10*3/uL — ABNORMAL HIGH (ref 4.0–10.5)
nRBC: 0 % (ref 0.0–0.2)

## 2022-05-10 LAB — HEPARIN LEVEL (UNFRACTIONATED): Heparin Unfractionated: 0.38 IU/mL (ref 0.30–0.70)

## 2022-05-10 LAB — SEDIMENTATION RATE: Sed Rate: 56 mm/hr — ABNORMAL HIGH (ref 0–30)

## 2022-05-10 LAB — LACTATE DEHYDROGENASE: LDH: 189 U/L (ref 98–192)

## 2022-05-10 LAB — FERRITIN: Ferritin: 342 ng/mL — ABNORMAL HIGH (ref 11–307)

## 2022-05-10 LAB — PHOSPHORUS: Phosphorus: 3 mg/dL (ref 2.5–4.6)

## 2022-05-10 LAB — FIBRINOGEN: Fibrinogen: 361 mg/dL (ref 210–475)

## 2022-05-10 LAB — D-DIMER, QUANTITATIVE: D-Dimer, Quant: 3.92 ug/mL-FEU — ABNORMAL HIGH (ref 0.00–0.50)

## 2022-05-10 LAB — C-REACTIVE PROTEIN: CRP: 9.6 mg/dL — ABNORMAL HIGH (ref <1.0)

## 2022-05-10 LAB — MAGNESIUM: Magnesium: 2 mg/dL (ref 1.7–2.4)

## 2022-05-10 MED ORDER — HYDROCODONE-ACETAMINOPHEN 5-325 MG PO TABS: 1.0000 | ORAL_TABLET | ORAL | 0 refills | Status: DC | PRN

## 2022-05-10 MED ORDER — AZITHROMYCIN 500 MG PO TABS: 500.0000 mg | ORAL_TABLET | Freq: Every day | ORAL | 0 refills | Status: AC

## 2022-05-10 MED ORDER — APIXABAN 5 MG PO TABS: 5.0000 mg | ORAL_TABLET | Freq: Two times a day (BID) | ORAL | Status: DC

## 2022-05-10 MED ORDER — FUROSEMIDE 40 MG PO TABS
40.0000 mg | ORAL_TABLET | Freq: Every day | ORAL | Status: DC
  Administered 2022-05-10: 40 mg via ORAL
  Filled 2022-05-10: qty 1

## 2022-05-10 MED ORDER — APIXABAN (ELIQUIS) VTE STARTER PACK (10MG AND 5MG): ORAL_TABLET | ORAL | 0 refills | Status: DC

## 2022-05-10 MED ORDER — IPRATROPIUM-ALBUTEROL 20-100 MCG/ACT IN AERS: 1.0000 | INHALATION_SPRAY | Freq: Four times a day (QID) | RESPIRATORY_TRACT | 0 refills | Status: AC | PRN

## 2022-05-10 MED ORDER — SPACER/AERO-HOLDING CHAMBERS DEVI: 1.0000 | 0 refills | Status: AC | PRN

## 2022-05-10 MED ORDER — GUAIFENESIN ER 600 MG PO TB12: 600.0000 mg | ORAL_TABLET | Freq: Two times a day (BID) | ORAL | 0 refills | Status: DC

## 2022-05-10 MED ORDER — APIXABAN 5 MG PO TABS
10.0000 mg | ORAL_TABLET | Freq: Two times a day (BID) | ORAL | Status: DC
  Administered 2022-05-10: 10 mg via ORAL
  Filled 2022-05-10: qty 2

## 2022-05-10 MED ORDER — CEFDINIR 300 MG PO CAPS: 300.0000 mg | ORAL_CAPSULE | Freq: Two times a day (BID) | ORAL | 0 refills | Status: AC

## 2022-05-10 MED ORDER — FUROSEMIDE 40 MG PO TABS: 40.0000 mg | ORAL_TABLET | Freq: Every day | ORAL | 1 refills | Status: DC

## 2022-05-10 NOTE — Consult Note (Signed)
ANTICOAGULATION CONSULT NOTE  Pharmacy Consult for IV Heparin Indication: DVT  Patient Measurements: Height: 5' 5" (165.1 cm) Weight: 69.4 kg (153 lb) IBW/kg (Calculated) : 57 Heparin Dosing Weight: 69.4  Labs: Recent Labs    05/07/22 1057 05/08/22 0648 05/08/22 1203 05/09/22 0624 05/09/22 1806 05/10/22 0211  HGB 8.0*  --  8.0* 7.7*  --  7.5*  HCT 25.5*  --  26.3* 24.1*  --  23.6*  PLT 190  --  179 163  --  157  APTT  --   --   --  27  --   --   LABPROT  --   --   --  13.5  --   --   INR  --   --   --  1.0  --   --   HEPARINUNFRC  --   --   --   --  0.41 0.38  CREATININE 1.60* 1.48*  --  1.36*  --   --      Estimated Creatinine Clearance: 31.8 mL/min (A) (by C-G formula based on SCr of 1.36 mg/dL (H)).   Medical History: Past Medical History:  Diagnosis Date   Allergic rhinitis    Allergy    Anemia    Anemia    Barrett's esophagus    Blood dyscrasia    multiple myloma remission   Cancer ([place])    Change in bowel habits    Compression fracture 2013   Degenerative disc disease, lumbar    Dysrhythmia    palpitations   Esophageal reflux    Family history of adverse reaction to anesthesia    nausea -mom   GERD (gastroesophageal reflux disease)    Gout    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[place])    Heart palpitations    Hyperlipidemia    Hypothyroidism    Inflammatory polyarthropathy (HCC)    Lumbago    Lumbar radiculitis    Lumbar stenosis with neurogenic claudication    Multiple myeloma (HCC)    Multiple myeloma (HCC)    Osteopenia    Osteoporosis    Other dysphagia    Palpitations    Positive PPD    Renal insufficiency    Rheumatoid arthritis (HCC)    Rheumatoid arthritis (HCC)    Seasonal allergies    Vertigo     Medications:  No anticoagulation prior to admission per my chart review. Patient has been receiving prophylactic dosed Lovenox while inpatient  Assessment: Patient is an 81 y/o F with medical history as above and including multiple  myeloma and recent COVID-19 infection who is admitted with pneumonia. Doppler of lower extremities showed "nonocclusive thrombus in one of the posterior tibial veins of the right calf". Pharmacy consulted to initiate and manage heparin infusion for DVT treatment.   Baseline aPTT and PT-INR pending. CBC notable for anemia which appears chronic.  Goal of Therapy:  Heparin level 0.3-0.7 units/ml Monitor platelets by anticoagulation protocol: Yes   Plan:  --Continue heparin infusion at 1150 units/hr --HL daily w/ AM labs while therapeutic --Daily CBC per protocol  Renda Rolls, PharmD, [facility] 05/10/2022 2:58 AM

## 2022-05-10 NOTE — Evaluation (Signed)
Occupational Therapy Evaluation Patient Details Name: Sydney Gillespie MRN: [id number] DOB: 1940-10-01 Today's Date: 05/10/2022   History of Present Illness Pt is an 81 year old female admitted with  Acute respiratory failure with hypoxia ([place]), Suspecting principally related to pneumonia with pulse will component of new CHF, pneumonia COVID-19 positive since 12/14; right leg DVT, acute T9 compression fracture and history of previous thoracic compression fractures; PMH significant for  multiple myeloma status post bone marrow transplantation and off of immunotherapy for 3 weeks as well as history of rheumatoid arthritis, vertigo, anemia, chronic kidney disease stage IV, hypothyroidism, gout, depression and anxiety   Clinical Impression   Chart reviewed, pt greeted in bed agreeable to OT evaluation. Pt is alert and oriented x4, good awareness of deficits and appropriate body mechanics. PTA Pt is generally MOD I-I for ADL/IADL however amb with RW, requiring some assist for ADL/IADL due to acute illness. Pt presents with deficits in strength, endurance, activity tolerance all affecting safe and optimal ADL completion. Recommend discharge with HHOT to address functional deficits. OT will continue to follow acutely.      Recommendations for follow up therapy are one component of a multi-disciplinary discharge planning process, led by the attending physician.  Recommendations may be updated based on patient status, additional functional criteria and insurance authorization.   Follow Up Recommendations  Home health OT     Assistance Recommended at Discharge Set up Supervision/Assistance  Patient can return home with the following Help with stairs or ramp for entrance;A little help with walking and/or transfers    Functional Status Assessment  Patient has had a recent decline in their functional status and demonstrates the ability to make significant improvements in function in a reasonable and  predictable amount of time.  Equipment Recommendations  None recommended by OT    Recommendations for Other Services       Precautions / Restrictions Precautions Precautions: Fall Restrictions Weight Bearing Restrictions: No      Mobility Bed Mobility Overal bed mobility: Needs Assistance Bed Mobility: Rolling, Sidelying to Sit Rolling: Modified independent (Device/Increase time) Sidelying to sit: Modified independent (Device/Increase time)       General bed mobility comments: good body mechanics    Transfers Overall transfer level: Needs assistance Equipment used: Rolling walker (2 wheels) Transfers: Sit to/from Stand Sit to Stand: Supervision                  Balance Overall balance assessment: Needs assistance Sitting-balance support: No upper extremity supported, Feet supported Sitting balance-Leahy Scale: Good     Standing balance support: No upper extremity supported, During functional activity Standing balance-Leahy Scale: Fair                             ADL either performed or assessed with clinical judgement   ADL Overall ADL's : Needs assistance/impaired Eating/Feeding: Set up;Sitting   Grooming: Wash/dry hands;Sitting;Supervision/safety           Upper Body Dressing : Minimal assistance;Sitting   Lower Body Dressing: Minimal assistance;Sitting/lateral leans   Toilet Transfer: Supervision/safety;Min guard;Rolling walker (2 wheels);Ambulation Toilet Transfer Details (indicate cue type and reason): simulated         Functional mobility during ADLs: Supervision/safety;Min guard;Rolling walker (2 wheels) (approx 15' in room)       Vision Patient Visual Report: No change from baseline       Perception     Praxis  Pertinent Vitals/Pain Pain Assessment Pain Assessment: Faces Faces Pain Scale: Hurts a little bit Pain Location: back Pain Descriptors / Indicators: Aching, Discomfort Pain Intervention(s): Limited  activity within patient's tolerance, Monitored during session, Premedicated before session, Repositioned (TLSO)     Hand Dominance Right   Extremity/Trunk Assessment Upper Extremity Assessment Upper Extremity Assessment: Overall WFL for tasks assessed   Lower Extremity Assessment Lower Extremity Assessment: Generalized weakness   Cervical / Trunk Assessment Cervical / Trunk Assessment: Normal   Communication Communication Communication: No difficulties   Cognition Arousal/Alertness: Awake/alert Behavior During Therapy: WFL for tasks assessed/performed Overall Cognitive Status: Within Functional Limits for tasks assessed                                       General Comments  vital signs monitored, HR up to 80 with activity, spo2 >90% throughout on RA    Exercises Other Exercises Other Exercises: edu re: role of OT, role of rehab, discharge recommendations, home safety, falls prevention, home set up, DME use   Shoulder Instructions      Home Living Family/patient expects to be discharged to:: Private residence Living Arrangements: Other relatives (granddaugther) Available Help at Discharge: Family;Available PRN/intermittently (grand daugther works as a Emergency planning/management officer) Type of Home: [employer] Access: Stairs to enter Technical brewer of Steps: 5-8 Entrance Stairs-Rails: Right;Left;Can reach both Home Layout: One level     Bathroom Shower/Tub: Chief Strategy Officer: Conservation officer, nature (2 wheels)          Prior Functioning/Environment Prior Level of Function : Independent/Modified Independent             Mobility Comments: amb with RW PRN since acute illness ADLs Comments: generally MOD I -I in ADL, recent assist for IADL, some ADL due to acute illnesses        OT Problem List: Decreased activity tolerance;Impaired balance (sitting and/or standing);Decreased safety awareness      OT Treatment/Interventions:  Self-care/ADL training;Therapeutic exercise;Energy conservation;DME and/or AE instruction;Therapeutic activities;Patient/family education;Balance training    OT Goals(Current goals can be found in the care plan section) Acute Rehab OT Goals Patient Stated Goal: go home OT Goal Formulation: With patient Time For Goal Achievement: 05/24/22 Potential to Achieve Goals: Good ADL Goals Pt Will Perform Grooming: with modified independence Pt Will Perform Lower Body Dressing: with modified independence Pt Will Transfer to Toilet: with modified independence;ambulating Pt Will Perform Toileting - Clothing Manipulation and hygiene: with modified independence;sit to/from stand  OT Frequency: Min 2X/week    Co-evaluation              AM-PAC OT "6 Clicks" Daily Activity     Outcome Measure Help from another person eating meals?: None Help from another person taking care of personal grooming?: A Little Help from another person toileting, which includes using toliet, bedpan, or urinal?: A Little Help from another person bathing (including washing, rinsing, drying)?: A Little Help from another person to put on and taking off regular upper body clothing?: None Help from another person to put on and taking off regular lower body clothing?: A Little 6 Click Score: 20   End of Session Equipment Utilized During Treatment: Rolling walker (2 wheels) Nurse Communication: Mobility status  Activity Tolerance: Patient tolerated treatment well Patient left: with call bell/phone within reach;in chair;with chair alarm set  OT Visit Diagnosis: Other abnormalities of  gait and mobility (R26.89);Muscle weakness (generalized) (M62.81)                Time: 1030-1055 OT Time Calculation (min): 25 min Charges:  OT General Charges $OT Visit: 1 Visit OT Evaluation $OT Eval Low Complexity: 1 Low Shanon Payor, OTD OTR/L  05/10/22, 11:26 AM

## 2022-05-10 NOTE — Progress Notes (Signed)
SaO2 on room air at rest = 94% SaO2 on room air while ambulating = 96% Patient did not require oxygen at any time during activity.

## 2022-05-10 NOTE — Assessment & Plan Note (Signed)
Doppler U/S on admission showed non-occlusive thrombus of posterior tibial vein on right.  No DVT seen on left. --On heparin drip --Transition to [facility] for discharge

## 2022-05-10 NOTE — Progress Notes (Signed)
Nursing Discharge Note   Admit Date: 05/06/2022  Discharge date: 05/10/2022   Sydney Gillespie is to be discharged home per MD order.  AVS completed. Reviewed with patient and family at bedside. Highlighted copy provided for patient to take home.  Patient/caregiver able to verbalize understanding of discharge instructions. PIV removed. Patient stable upon discharge.   Discharge Instructions     (HEART FAILURE PATIENTS) Call MD:  Anytime you have any of the following symptoms: 1) 3 pound weight gain in 24 hours or 5 pounds in 1 week 2) shortness of breath, with or without a dry hacking cough 3) swelling in the hands, feet or stomach 4) if you have to sleep on extra pillows at night in order to breathe.   Complete by: As directed    Call MD for:  extreme fatigue   Complete by: As directed    Call MD for:  persistant dizziness or light-headedness   Complete by: As directed    Call MD for:  persistant nausea and vomiting   Complete by: As directed    Call MD for:  severe uncontrolled pain   Complete by: As directed    Call MD for:  temperature >100.4   Complete by: As directed    Diet - low sodium heart healthy   Complete by: As directed    Discharge instructions   Complete by: As directed    For your T9 compression fracture -- pain control is the primary goal.  You can use the brace if you find it helpful for comfort and pain control with movement or activity.  Neurosurgery will see you for follow up and repeat X-rays in a few weeks.    Recommend taking stool softeners on routine basis if you're needing pain medication stronger than just Tylenol. Pain medications cause bad constipation, so be sure to stay well hydrated and use Colace, Miralax, Senna or other over-the-counter stool softeners.   Take both antibiotics for 3 more days to finish up treatment for pneumonia.  Please follow up with Primary Care in 1-2 weeks to check on your recovery and have labs repeated.   Dr. Nicole Kindred, DO [facility]   Increase activity slowly   Complete by: As directed        Allergies as of 05/10/2022       Reactions   Isoniazid Other (See Comments)   Blisters         Medication List     STOP taking these medications    triamterene-hydrochlorothiazide 37.5-25 MG tablet Commonly known as: MAXZIDE-25       TAKE these medications    acetaminophen 325 MG tablet Commonly known as: TYLENOL Take 650 mg by mouth every 6 (six) hours as needed for fever or headache.   acyclovir 400 MG tablet Commonly known as: ZOVIRAX Take 1 tablet (400 mg total) by mouth 2 (two) times daily.   albuterol 108 (90 Base) MCG/ACT inhaler Commonly known as: VENTOLIN HFA Inhale 2 puffs into the lungs every 4 (four) hours as needed for wheezing or shortness of breath.   allopurinol 300 MG tablet Commonly known as: ZYLOPRIM Take 450 mg by mouth daily.   ALPRAZolam 0.25 MG tablet Commonly known as: XANAX TAKE 1 TABLET(0.25 MG) BY MOUTH AT BEDTIME AS NEEDED FOR ANXIETY What changed: See the new instructions.   Apixaban Starter Pack ('10mg'$  and '5mg'$ ) Commonly known as: ELIQUIS STARTER PACK Take as directed on package: start with two-'5mg'$  tablets twice daily for 7  days. On day 8, switch to one-'5mg'$  tablet twice daily.   atenolol 50 MG tablet Commonly known as: TENORMIN Take 0.5 tablets (25 mg total) by mouth daily. What changed: how much to take   azithromycin 500 MG tablet Commonly known as: Zithromax Take 1 tablet (500 mg total) by mouth daily for 3 days. Take 1 tablet daily for 3 days.   benzonatate 200 MG capsule Commonly known as: TESSALON Take 1 capsule (200 mg total) by mouth 3 (three) times daily as needed for cough.   CALCIUM MAGNESIUM ZINC PO Take 1 tablet by mouth in the morning, at noon, and at bedtime.   cefdinir 300 MG capsule Commonly known as: OMNICEF Take 1 capsule (300 mg total) by mouth 2 (two) times daily for 3 days.   cetirizine 10 MG  tablet Commonly known as: ZYRTEC Take 10 mg by mouth 2 (two) times daily.   chlorpheniramine-HYDROcodone 10-8 MG/5ML Commonly known as: TUSSIONEX Take 5 mLs by mouth every 12 (twelve) hours as needed (for cough unrelieved by other medications). May cause drowsiness.   cyanocobalamin 1000 MCG tablet Commonly known as: VITAMIN B12 Take 1,000 mcg by mouth daily.   docusate sodium 100 MG capsule Commonly known as: COLACE Take 100 mg by mouth 2 (two) times daily.   DULoxetine 20 MG capsule Commonly known as: CYMBALTA Take 20 mg by mouth 2 (two) times daily.   fluticasone 50 MCG/ACT nasal spray Commonly known as: FLONASE Place 2 sprays into the nose daily.   furosemide 40 MG tablet Commonly known as: LASIX Take 1 tablet (40 mg total) by mouth daily. Start taking on: May 11, 2022   guaiFENesin 600 MG 12 hr tablet Commonly known as: MUCINEX Take 1 tablet (600 mg total) by mouth 2 (two) times daily.   HYDROcodone-acetaminophen 5-325 MG tablet Commonly known as: NORCO/VICODIN Take 1-2 tablets by mouth every 4 (four) hours as needed for moderate pain.   hydroxychloroquine 200 MG tablet Commonly known as: PLAQUENIL Take 200 mg by mouth 2 (two) times daily.   ipratropium 17 MCG/ACT inhaler Commonly known as: ATROVENT HFA Inhale 2 puffs into the lungs every 4 (four) hours as needed for wheezing.   Ipratropium-Albuterol 20-100 MCG/ACT Aers respimat Commonly known as: COMBIVENT Inhale 1 puff into the lungs every 6 (six) hours as needed for wheezing.   lansoprazole 15 MG capsule Commonly known as: PREVACID Take 30 mg by mouth 2 (two) times daily before a meal.   lenalidomide 10 MG capsule Commonly known as: REVLIMID Take 1 capsule (10 mg total) by mouth daily. Take for 21 days, then hold for 7 days. Repeat every 28 days.   levothyroxine 112 MCG tablet Commonly known as: SYNTHROID Take 112 mcg by mouth daily before breakfast.   Myrbetriq 50 MG Tb24 tablet Generic drug:  mirabegron ER Take 50 mg by mouth daily.   ondansetron 8 MG tablet Commonly known as: ZOFRAN Take 4-8 mg by mouth every 8 (eight) hours as needed for nausea or vomiting.   oxybutynin 10 MG 24 hr tablet Commonly known as: DITROPAN-XL Take 1 tablet (10 mg total) by mouth daily.   Potassium 99 MG Tabs Take 1 tablet by mouth daily.   rOPINIRole 0.5 MG tablet Commonly known as: REQUIP Take 0.75 mg by mouth at bedtime as needed (Restless leg).   Spacer/Aero-Holding Dorise Bullion 1 each by Does not apply route as needed.   tiZANidine 2 MG tablet Commonly known as: ZANAFLEX Take 2 mg by mouth at bedtime.  vitamin C 1000 MG tablet Take 1,000 mg by mouth daily.   zinc gluconate 50 MG tablet Take 50 mg by mouth daily.         Discharge Instructions/ Education: Discharge instructions given to patient/family with verbalized understanding. Discharge education completed with patient/family including: follow up instructions, medication list, discharge activities, and limitations if indicated. Additional discharge instructions as indicated by discharging provider also reviewed. Patient and family able to verbalize understanding, all questions fully answered. Patient instructed to return to Emergency Department, call 911, or call MD for any changes in condition.  Patient escorted via wheelchair to lobby and discharged home via private automobile.

## 2022-05-10 NOTE — Progress Notes (Signed)
[address] [facility] Cardiology  SUBJECTIVE: Laying in bed, reports doing well, denies chest pain or shortness of breath   Vitals:   05/09/22 1705 05/09/22 2054 05/10/22 0100 05/10/22 0502  BP: (!) 132/59 (!) 146/65 (!) 146/61 (!) 147/63  Pulse: 70 72 77 71  Resp: _0 Temp: 98.2 F (36.8 C) 97.6 F (36.4 C) 98.4 F (36.9 C) 98.4 F (36.9 C)  TempSrc:   Oral Oral  SpO2: 93% 94% 99% 94%  Weight:      Height:         Intake/Output Summary (Last 24 hours) at 05/10/2022 4174 Last data filed at 05/10/2022 0503 Gross per 24 hour  Intake 1369.75 ml  Output 300 ml  Net 1069.75 ml      PHYSICAL EXAM  General: Well developed, well nourished, in no acute distress HEENT:  Normocephalic and atramatic Neck:  No JVD.  Lungs: Clear bilaterally to auscultation and percussion. Heart: HRRR . Normal S1 and S2 without gallops or murmurs.  Abdomen: Bowel sounds are positive, abdomen soft and non-tender  Msk:  Back normal, normal gait. Normal strength and tone for age. Extremities: No clubbing, cyanosis or edema.   Neuro: Alert and oriented X 3. Psych:  Good affect, responds appropriately   LABS: Basic Metabolic Panel: Recent Labs    05/09/22 0624 05/10/22 0211  NA 134* 132*  K 4.3 3.9  CL 100 97*  CO2 25 24  GLUCOSE 131* 130*  BUN 52* 54*  CREATININE 1.36* 1.43*  CALCIUM 8.3* 8.1*  MG 2.2 2.0  PHOS 3.9 3.0   Liver Function Tests: Recent Labs    05/09/22 0624 05/10/22 0211  AST 36 28  ALT 21 18  ALKPHOS 59 55  BILITOT 0.4 0.6  PROT 5.6* 5.4*  ALBUMIN 2.5* 2.5*   No results for input(s): "LIPASE", "AMYLASE" in the last 72 hours. CBC: Recent Labs    05/09/22 0624 05/10/22 0211  WBC 11.9* 12.2*  NEUTROABS 11.4* 11.6*  HGB 7.7* 7.5*  HCT 24.1* 23.6*  MCV 100.8* 100.4*  PLT 163 157   Cardiac Enzymes: No results for input(s): "CKTOTAL", "CKMB", "CKMBINDEX", "TROPONINI" in the last 72 hours. BNP: Invalid input(s): "POCBNP" D-Dimer: Recent Labs    05/09/22 0624  05/10/22 0211  DDIMER 3.96* 3.92*   Hemoglobin A1C: No results for input(s): "HGBA1C" in the last 72 hours. Fasting Lipid Panel: No results for input(s): "CHOL", "HDL", "LDLCALC", "TRIG", "CHOLHDL", "LDLDIRECT" in the last 72 hours. Thyroid Function Tests: No results for input(s): "TSH", "T4TOTAL", "T3FREE", "THYROIDAB" in the last 72 hours.  Invalid input(s): "FREET3" Anemia Panel: Recent Labs    05/08/22 0648 05/09/22 0624 05/10/22 0211  [id number] 1,785*  --   --   FOLATE 11.9  --   --   FERRITIN 492*   < > 342*  TIBC 160*  --   --   IRON 67  --   --   RETICCTPCT 3.0  --   --    < > = values in this interval not displayed.    DG Chest Port 1 View  Result Date: 05/09/2022 CLINICAL DATA:  [id number] SOB (shortness of breath) [id number] EXAM: PORTABLE CHEST 1 VIEW COMPARISON:  Chest x-ray 05/09/2022 FINDINGS: The heart and mediastinal contours are unchanged. Aortic calcification. No focal consolidation. No pulmonary edema. Trace left pleural effusion. No right pleural effusion. No pneumothorax. No acute osseous abnormality.  Old healed right rib fractures. IMPRESSION: 1. Trace left pleural effusion. 2.  Aortic Atherosclerosis (ICD10-I70.0). Electronically  Signed   By: Iven Finn M.D.   On: 05/09/2022 21:08   DG Chest Port 1 View  Result Date: 05/09/2022 CLINICAL DATA:  Shortness of breath EXAM: PORTABLE CHEST 1 VIEW COMPARISON:  05/08/2022 and prior studies FINDINGS: Cardiomediastinal silhouette is unchanged. Bibasilar opacities/atelectasis are not significantly changed. There is no evidence of pneumothorax or large pleural effusion. No acute bony abnormalities are identified. IMPRESSION: Unchanged appearance of the chest with bibasilar opacities/atelectasis. Electronically Signed   By: Margarette Canada M.D.   On: 05/09/2022 08:41   US Venous Img Lower Bilateral (DVT)  Result Date: 05/08/2022 CLINICAL DATA:  Elevated D-dimer EXAM: BILATERAL LOWER EXTREMITY VENOUS DOPPLER ULTRASOUND  TECHNIQUE: Gray-scale sonography with graded compression, as well as color Doppler and duplex ultrasound were performed to evaluate the lower extremity deep venous systems from the level of the common femoral vein and including the common femoral, femoral, profunda femoral, popliteal and calf veins including the posterior tibial, peroneal and gastrocnemius veins when visible. The superficial great saphenous vein was also interrogated. Spectral Doppler was utilized to evaluate flow at rest and with distal augmentation maneuvers in the common femoral, femoral and popliteal veins. COMPARISON:  Ultrasound 04/25/2022 FINDINGS: RIGHT LOWER EXTREMITY Common Femoral Vein: No evidence of thrombus. Normal compressibility, respiratory phasicity and response to augmentation. Saphenofemoral Junction: No evidence of thrombus. Normal compressibility and flow on color Doppler imaging. Profunda Femoral Vein: No evidence of thrombus. Normal compressibility and flow on color Doppler imaging. Femoral Vein: No evidence of thrombus. Normal compressibility, respiratory phasicity and response to augmentation. Popliteal Vein: No evidence of thrombus. Normal compressibility, respiratory phasicity and response to augmentation. Calf Veins: Nonocclusive thrombus in one of the posterior tibial veins. Patent peroneal vein. Superficial Great Saphenous Vein: No evidence of thrombus. Normal compressibility. Venous Reflux:  None. Other Findings:  None. LEFT LOWER EXTREMITY Common Femoral Vein: No evidence of thrombus. Normal compressibility, respiratory phasicity and response to augmentation. Saphenofemoral Junction: No evidence of thrombus. Normal compressibility and flow on color Doppler imaging. Profunda Femoral Vein: No evidence of thrombus. Normal compressibility and flow on color Doppler imaging. Femoral Vein: No evidence of thrombus. Normal compressibility, respiratory phasicity and response to augmentation. Popliteal Vein: No evidence of  thrombus. Normal compressibility, respiratory phasicity and response to augmentation. Calf Veins: No evidence of thrombus. Normal compressibility and flow on color Doppler imaging. Superficial Great Saphenous Vein: No evidence of thrombus. Normal compressibility. Venous Reflux:  None. Other Findings:  None. IMPRESSION: Nonocclusive thrombus in one of the posterior tibial veins of the right calf. No evidence of DVT in the left lower extremity. Electronically Signed   By: Maurine Simmering M.D.   On: 05/08/2022 11:57     Echo EF 60-65% 05/07/2022  TELEMETRY: Sinus rhythm:  ASSESSMENT AND PLAN:  Principal Problem:   Acute respiratory failure with hypoxia (HCC) Active Problems:   COVID-19 virus infection   Multiple myeloma (HCC)   Hypothyroidism   Depression with anxiety   Rheumatoid arthritis (HCC)   CKD (chronic kidney disease), stage IV (HCC)   Anemia of chronic kidney failure   Compression fracture of body of thoracic vertebra (HCC)   Pneumonia involving right lung   Elevated troponin    1. Acute on chronic diastolic congestive heart failure, HFpEF, EF 60-65% 05/07/2022, with good diuresis on furosemide 20 mg IV twice daily, with stable renal function, BUN and creatinine 54 and 1.43, respectively, appears euvolemic 2.  Acute hypoxic respiratory failure, likely due to COVID-19 with superimposed bacterial pneumonia, on empiric IV  broad-spectrum antibiotics and Solu-Medrol 3.  Borderline elevated high-sensitivity troponin (29, 30), likely demand supply ischemia due to respiratory failure with underlying CKD 4.  CKD stage IV, currently stable   Recommendations   1.  Agree with current therapy 2.  Transition IV to p.o. furosemide 3.  Continue current dose of atenolol 4.  Defer further cardiac diagnostics at this time   Isaias Cowman, MD, PhD, [facility] 05/10/2022 8:37 AM

## 2022-05-10 NOTE — Discharge Summary (Signed)
Physician Discharge Summary   Patient: Sydney Gillespie MRN: [id number] DOB: 06/19/1940  Admit date:     05/06/2022  Discharge date: 05/10/22  Discharge Physician:  A    PCP: Hande, Vishwanath, MD   Recommendations at discharge:  {Tip this will not be part of the note when signed- Example include specific recommendations for outpatient follow-up, pending tests to follow-up on. (Optional):26781} Follow up with Primary Care in 1-2 weeks Follow up with Neurosurgery Follow up with Cardiology Repeat CBC, BMP, Mg in 1-2 weeks  Discharge Diagnoses: Principal Problem:   Acute respiratory failure with hypoxia (HCC) Active Problems:   Pneumonia involving right lung   COVID-19 virus infection   Multiple myeloma (HCC)   CKD (chronic kidney disease), stage IV (HCC)   Elevated troponin   Rheumatoid arthritis (HCC)   Hypothyroidism   Depression with anxiety   Anemia of chronic kidney failure   Compression fracture of body of thoracic vertebra (HCC)   Acute deep vein thrombosis (DVT) of calf muscle vein of right lower extremity (HCC)  Resolved Problems:   * No resolved hospital problems. *  Hospital Course:      Assessment and Plan: * Acute respiratory failure with hypoxia (HCC) Suspecting principally related to pneumonia with pulse will component of new CHF given elevated BNP, cardiomegaly and pulmonary hypertension on CT chest O2 sat with EMS was 85% requiring 2 L to maintain sats in the high 90s Continue supplemental oxygen and wean as tolerated Treat individual etiologies as listed separately  Pneumonia involving right lung COVID-19 positive since 12/14 Suspect superimposed bacterial pneumonia High risk for severe infection due to chronic immunosuppressants Follow procalcitonin Rocephin and azithromycin Antitussives, incentive spirometry and supportive care Albuterol as needed for wheezing Continue supplemental oxygen to wean as tolerated  Multiple myeloma  (HCC) S/p bone marrow transplant, on immunotherapy until 3 weeks ago No acute issues suspected  Elevated troponin Possible new onset CHF: Elevated BNP without history of CHF Troponin 30 with BNP 1274 Patient has chest pain that is mostly atypical and worse when coughing, suspect related to T9 fracture Continue to trend troponin Echocardiogram to evaluate for segmental wall motion abnormality Low-dose IV Lasix 20 mg p.o. twice daily Daily weights with intake and output monitoring Cardiology consulted  CKD (chronic kidney disease), stage IV (HCC) Anemia of CKD 4 Renal function and hemoglobin at baseline  Rheumatoid arthritis (HCC) Continue Plaquenil and Arava  Hypothyroidism Continue levothyroxine and atenolol  Acute deep vein thrombosis (DVT) of calf muscle vein of right lower extremity (HCC) Doppler U/S on admission showed non-occlusive thrombus of posterior tibial vein on right.  No DVT seen on left. --On heparin drip --Transition to [facility] for discharge  Compression fracture of body of thoracic vertebra (HCC) Possibly triggered by intractable coughing Pain control and up with assistance Can consider neurosurgery consult to evaluate for need for kyphoplasty  Anemia of chronic kidney failure CKD 4      {Tip this will not be part of the note when signed Body mass index is 25.46 kg/m. , ,  (Optional):26781}  {(NOTE) Pain control PDMP Statment (Optional):26782} Consultants: *** Procedures performed: ***  Disposition: {Plan; Disposition:26390} Diet recommendation:  Discharge Diet Orders (From admission, onward)     Start     Ordered   05/10/22 0000  Diet - low sodium heart healthy        05/10/22 1506           {Diet_Plan:26776} DISCHARGE MEDICATION: Allergies as of 05/10/2022         Reactions   Isoniazid Other (See Comments)   Blisters         Medication List     STOP taking these medications    triamterene-hydrochlorothiazide 37.5-25 MG  tablet Commonly known as: MAXZIDE-25       TAKE these medications    acetaminophen 325 MG tablet Commonly known as: TYLENOL Take 650 mg by mouth every 6 (six) hours as needed for fever or headache.   acyclovir 400 MG tablet Commonly known as: ZOVIRAX Take 1 tablet (400 mg total) by mouth 2 (two) times daily.   albuterol 108 (90 Base) MCG/ACT inhaler Commonly known as: VENTOLIN HFA Inhale 2 puffs into the lungs every 4 (four) hours as needed for wheezing or shortness of breath.   allopurinol 300 MG tablet Commonly known as: ZYLOPRIM Take 450 mg by mouth daily.   ALPRAZolam 0.25 MG tablet Commonly known as: XANAX TAKE 1 TABLET(0.25 MG) BY MOUTH AT BEDTIME AS NEEDED FOR ANXIETY What changed: See the new instructions.   Apixaban Starter Pack (93m and 581m Commonly known as: ELIQUIS STARTER PACK Take as directed on package: start with two-53m53mablets twice daily for 7 days. On day 8, switch to one-53mg7mblet twice daily.   atenolol 50 MG tablet Commonly known as: TENORMIN Take 0.5 tablets (25 mg total) by mouth daily. What changed: how much to take   azithromycin 500 MG tablet Commonly known as: Zithromax Take 1 tablet (500 mg total) by mouth daily for 3 days. Take 1 tablet daily for 3 days.   benzonatate 200 MG capsule Commonly known as: TESSALON Take 1 capsule (200 mg total) by mouth 3 (three) times daily as needed for cough.   CALCIUM MAGNESIUM ZINC PO Take 1 tablet by mouth in the morning, at noon, and at bedtime.   cefdinir 300 MG capsule Commonly known as: OMNICEF Take 1 capsule (300 mg total) by mouth 2 (two) times daily for 3 days.   cetirizine 10 MG tablet Commonly known as: ZYRTEC Take 10 mg by mouth 2 (two) times daily.   chlorpheniramine-HYDROcodone 10-8 MG/5ML Commonly known as: TUSSIONEX Take 5 mLs by mouth every 12 (twelve) hours as needed (for cough unrelieved by other medications). May cause drowsiness.   cyanocobalamin 1000 MCG  tablet Commonly known as: VITAMIN B12 Take 1,000 mcg by mouth daily.   docusate sodium 100 MG capsule Commonly known as: COLACE Take 100 mg by mouth 2 (two) times daily.   DULoxetine 20 MG capsule Commonly known as: CYMBALTA Take 20 mg by mouth 2 (two) times daily.   fluticasone 50 MCG/ACT nasal spray Commonly known as: FLONASE Place 2 sprays into the nose daily.   furosemide 40 MG tablet Commonly known as: LASIX Take 1 tablet (40 mg total) by mouth daily. Start taking on: May 11, 2022   guaiFENesin 600 MG 12 hr tablet Commonly known as: MUCINEX Take 1 tablet (600 mg total) by mouth 2 (two) times daily.   HYDROcodone-acetaminophen 5-325 MG tablet Commonly known as: NORCO/VICODIN Take 1-2 tablets by mouth every 4 (four) hours as needed for moderate pain.   hydroxychloroquine 200 MG tablet Commonly known as: PLAQUENIL Take 200 mg by mouth 2 (two) times daily.   ipratropium 17 MCG/ACT inhaler Commonly known as: ATROVENT HFA Inhale 2 puffs into the lungs every 4 (four) hours as needed for wheezing.   Ipratropium-Albuterol 20-100 MCG/ACT Aers respimat Commonly known as: COMBIVENT Inhale 1 puff into the lungs every 6 (six) hours as needed for wheezing.  lansoprazole 15 MG capsule Commonly known as: PREVACID Take 30 mg by mouth 2 (two) times daily before a meal.   lenalidomide 10 MG capsule Commonly known as: REVLIMID Take 1 capsule (10 mg total) by mouth daily. Take for 21 days, then hold for 7 days. Repeat every 28 days.   levothyroxine 112 MCG tablet Commonly known as: SYNTHROID Take 112 mcg by mouth daily before breakfast.   Myrbetriq 50 MG Tb24 tablet Generic drug: mirabegron ER Take 50 mg by mouth daily.   ondansetron 8 MG tablet Commonly known as: ZOFRAN Take 4-8 mg by mouth every 8 (eight) hours as needed for nausea or vomiting.   oxybutynin 10 MG 24 hr tablet Commonly known as: DITROPAN-XL Take 1 tablet (10 mg total) by mouth daily.   Potassium  99 MG Tabs Take 1 tablet by mouth daily.   rOPINIRole 0.5 MG tablet Commonly known as: REQUIP Take 0.75 mg by mouth at bedtime as needed (Restless leg).   Spacer/Aero-Holding Chambers Devi 1 each by Does not apply route as needed.   tiZANidine 2 MG tablet Commonly known as: ZANAFLEX Take 2 mg by mouth at bedtime.   vitamin C 1000 MG tablet Take 1,000 mg by mouth daily.   zinc gluconate 50 MG tablet Take 50 mg by mouth daily.        Follow-up Information     Hande, Vishwanath, MD. Call.   Specialty: Internal Medicine Why: hospital follow up Contact information: [address] [place] [address] [phone]         Smith, Brandon Wayne, MD. Call.   Specialty: Neurosurgery Why: Follow up for T9 compression fracture Contact information: [address] [place] [address] [phone]                Discharge Exam: Filed Weights   05/06/22 1444 05/06/22 1447  Weight: 69.4 kg 69.4 kg   ***  Condition at discharge: {DC Condition:26389}  The results of significant diagnostics from this hospitalization (including imaging, microbiology, ancillary and laboratory) are listed below for reference.   Imaging Studies: DG Thoracic Spine 2 View  Result Date: 05/10/2022 CLINICAL DATA:  81-year-old female with multiple myeloma. Multiple thoracic compression fractures. EXAM: THORACIC SPINE 2 VIEWS COMPARISON:  Thoracic spine CT 05/06/2022. FINDINGS: AP and lateral views of the thoracic spine. Osteopenia. Calcified aortic atherosclerosis. Mild to moderate T12 compression fracture with superior endplate deformity appears stable. T10 and T11 appear to remain intact. On the lateral view T9 compression fracture is more severe and also seems progressed from 05/06/2022, now approaching vertebra plana. However, on the AP view it appears stable. T8 level appears stable. Mild T7 superior endplate compression is stable. T5 and T6 appear intact. T1 partially  obscured by ACDF hardware. T2 through T4 appear grossly intact. Negative visible mediastinal contours and bowel gas pattern. IMPRESSION: Questionable T9 compression fracture further loss of height, but otherwise stable thoracic spine compared to the CT on 05/06/2022. Aortic Atherosclerosis (ICD10-I70.0). Electronically Signed   By: H  Hall M.D.   On: 05/10/2022 11:18   DG Chest Port 1 View  Result Date: 05/09/2022 CLINICAL DATA:  141880 SOB (shortness of breath) [id number] EXAM: PORTABLE CHEST 1 VIEW COMPARISON:  Chest x-ray 05/09/2022 FINDINGS: The heart and mediastinal contours are unchanged. Aortic calcification. No focal consolidation. No pulmonary edema. Trace left pleural effusion. No right pleural effusion. No pneumothorax. No acute osseous abnormality.  Old healed right rib fractures. IMPRESSION: 1. Trace left pleural effusion. 2.  Aortic Atherosclerosis (  ICD10-I70.0). Electronically Signed   By: Iven Finn M.D.   On: 05/09/2022 21:08   DG Chest Port 1 View  Result Date: 05/09/2022 CLINICAL DATA:  Shortness of breath EXAM: PORTABLE CHEST 1 VIEW COMPARISON:  05/08/2022 and prior studies FINDINGS: Cardiomediastinal silhouette is unchanged. Bibasilar opacities/atelectasis are not significantly changed. There is no evidence of pneumothorax or large pleural effusion. No acute bony abnormalities are identified. IMPRESSION: Unchanged appearance of the chest with bibasilar opacities/atelectasis. Electronically Signed   By: Margarette Canada M.D.   On: 05/09/2022 08:41   US Venous Img Lower Bilateral (DVT)  Result Date: 05/08/2022 CLINICAL DATA:  Elevated D-dimer EXAM: BILATERAL LOWER EXTREMITY VENOUS DOPPLER ULTRASOUND TECHNIQUE: Gray-scale sonography with graded compression, as well as color Doppler and duplex ultrasound were performed to evaluate the lower extremity deep venous systems from the level of the common femoral vein and including the common femoral, femoral, profunda femoral, popliteal and  calf veins including the posterior tibial, peroneal and gastrocnemius veins when visible. The superficial great saphenous vein was also interrogated. Spectral Doppler was utilized to evaluate flow at rest and with distal augmentation maneuvers in the common femoral, femoral and popliteal veins. COMPARISON:  Ultrasound 04/25/2022 FINDINGS: RIGHT LOWER EXTREMITY Common Femoral Vein: No evidence of thrombus. Normal compressibility, respiratory phasicity and response to augmentation. Saphenofemoral Junction: No evidence of thrombus. Normal compressibility and flow on color Doppler imaging. Profunda Femoral Vein: No evidence of thrombus. Normal compressibility and flow on color Doppler imaging. Femoral Vein: No evidence of thrombus. Normal compressibility, respiratory phasicity and response to augmentation. Popliteal Vein: No evidence of thrombus. Normal compressibility, respiratory phasicity and response to augmentation. Calf Veins: Nonocclusive thrombus in one of the posterior tibial veins. Patent peroneal vein. Superficial Great Saphenous Vein: No evidence of thrombus. Normal compressibility. Venous Reflux:  None. Other Findings:  None. LEFT LOWER EXTREMITY Common Femoral Vein: No evidence of thrombus. Normal compressibility, respiratory phasicity and response to augmentation. Saphenofemoral Junction: No evidence of thrombus. Normal compressibility and flow on color Doppler imaging. Profunda Femoral Vein: No evidence of thrombus. Normal compressibility and flow on color Doppler imaging. Femoral Vein: No evidence of thrombus. Normal compressibility, respiratory phasicity and response to augmentation. Popliteal Vein: No evidence of thrombus. Normal compressibility, respiratory phasicity and response to augmentation. Calf Veins: No evidence of thrombus. Normal compressibility and flow on color Doppler imaging. Superficial Great Saphenous Vein: No evidence of thrombus. Normal compressibility. Venous Reflux:  None. Other  Findings:  None. IMPRESSION: Nonocclusive thrombus in one of the posterior tibial veins of the right calf. No evidence of DVT in the left lower extremity. Electronically Signed   By: Maurine Simmering M.D.   On: 05/08/2022 11:57   DG Chest Port 1 View  Result Date: 05/08/2022 CLINICAL DATA:  Per MD note, pneumonia EXAM: PORTABLE CHEST 1 VIEW COMPARISON:  05/06/2022 FINDINGS: Similar cardiomegaly. Aortic atherosclerotic calcification. Bibasilar atelectasis/infiltrates. The right basilar infiltrates are compatible with pneumonia on CT 05/06/2022 though these are not well demonstrated radiographically. Pulmonary vascular congestion. Dilated gas-filled loops of bowel in the upper abdomen. Cervical spine fusion hardware. IMPRESSION: Bibasilar infiltrates/atelectasis. The right lower lobe pneumonia seen on chest CT 05/06/2022 is not well demonstrated radiographically. Cardiomegaly and pulmonary vascular congestion. Partially visualized dilated loops of bowel in the upper abdomen. If obstruction is a concern, consider CT for further evaluation. Electronically Signed   By: Placido Sou M.D.   On: 05/08/2022 00:58   ECHOCARDIOGRAM COMPLETE  Result Date: 05/07/2022    ECHOCARDIOGRAM REPORT   Patient  Name:   Avielle S Lillibridge Date of Exam: 05/07/2022 Medical Rec #:  [id number]          Height:       65.0 in Accession #:    [id number]         Weight:       153.0 lb Date of Birth:  06/07/1940           BSA:          1.765 m Patient Age:    81 years           BP:           139/64 mmHg Patient Gender: F                  HR:           80 bpm. Exam Location:  [facility] Procedure: 2D Echo, Cardiac Doppler and Color Doppler Indications:     CHF-acute diastolic I50.31  History:         Patient has no prior history of Echocardiogram examinations.                  Arrythmias:LBBB; Risk Factors:Dyslipidemia. Palpitations.  Sonographer:     Jerry Hege Referring Phys:  [id number] HAZEL V DUNCAN Diagnosing Phys: Alexander Paraschos MD   Sonographer Comments: Image quality was good. IMPRESSIONS  1. Left ventricular ejection fraction, by estimation, is 60 to 65%. The left ventricle has normal function. The left ventricle has no regional wall motion abnormalities. Left ventricular diastolic parameters are consistent with Grade I diastolic dysfunction (impaired relaxation).  2. Right ventricular systolic function is normal. The right ventricular size is normal.  3. The mitral valve is normal in structure. Mild mitral valve regurgitation. No evidence of mitral stenosis.  4. Tricuspid valve regurgitation is mild to moderate.  5. The aortic valve is normal in structure. Aortic valve regurgitation is mild to moderate. No aortic stenosis is present.  6. The inferior vena cava is normal in size with greater than 50% respiratory variability, suggesting right atrial pressure of 3 mmHg. FINDINGS  Left Ventricle: Left ventricular ejection fraction, by estimation, is 60 to 65%. The left ventricle has normal function. The left ventricle has no regional wall motion abnormalities. The left ventricular internal cavity size was normal in size. There is  no left ventricular hypertrophy. Left ventricular diastolic parameters are consistent with Grade I diastolic dysfunction (impaired relaxation). Right Ventricle: The right ventricular size is normal. No increase in right ventricular wall thickness. Right ventricular systolic function is normal. Left Atrium: Left atrial size was normal in size. Right Atrium: Right atrial size was normal in size. Pericardium: Trivial pericardial effusion is present. Mitral Valve: The mitral valve is normal in structure. Mild mitral valve regurgitation. No evidence of mitral valve stenosis. Tricuspid Valve: The tricuspid valve is normal in structure. Tricuspid valve regurgitation is mild to moderate. No evidence of tricuspid stenosis. Aortic Valve: The aortic valve is normal in structure. Aortic valve regurgitation is mild to moderate.  Aortic regurgitation PHT measures 445 msec. No aortic stenosis is present. Aortic valve mean gradient measures 4.5 mmHg. Aortic valve peak gradient measures 7.8 mmHg. Aortic valve area, by VTI measures 2.99 cm. Pulmonic Valve: The pulmonic valve was normal in structure. Pulmonic valve regurgitation is not visualized. No evidence of pulmonic stenosis. Aorta: The aortic root is normal in size and structure. Venous: The inferior vena cava is normal in size with greater than 50% respiratory variability, suggesting right   atrial pressure of 3 mmHg. IAS/Shunts: No atrial level shunt detected by color flow Doppler.  LEFT VENTRICLE PLAX 2D LVIDd:         4.50 cm   Diastology LVIDs:         2.70 cm   LV e' medial:    5.66 cm/s LV PW:         0.90 cm   LV E/e' medial:  18.6 LV IVS:        0.80 cm   LV e' lateral:   10.70 cm/s LVOT diam:     2.00 cm   LV E/e' lateral: 9.8 LV SV:         85 LV SV Index:   48 LVOT Area:     3.14 cm  RIGHT VENTRICLE RV Basal diam:  2.40 cm RV Mid diam:    2.80 cm RV S prime:     13.60 cm/s TAPSE (M-mode): 2.1 cm LEFT ATRIUM             Index        RIGHT ATRIUM          Index LA diam:        4.40 cm 2.49 cm/m   RA Area:     7.95 cm LA Vol (A2C):   87.6 ml 49.62 ml/m  RA Volume:   10.40 ml 5.89 ml/m LA Vol (A4C):   61.0 ml 34.56 ml/m LA Biplane Vol: 73.9 ml 41.86 ml/m  AORTIC VALVE AV Area (Vmax):    2.41 cm AV Area (Vmean):   2.27 cm AV Area (VTI):     2.99 cm AV Vmax:           139.50 cm/s AV Vmean:          102.350 cm/s AV VTI:            0.284 m AV Peak Grad:      7.8 mmHg AV Mean Grad:      4.5 mmHg LVOT Vmax:         107.00 cm/s LVOT Vmean:        73.900 cm/s LVOT VTI:          0.270 m LVOT/AV VTI ratio: 0.95 AI PHT:            445 msec  AORTA Ao Root diam: 3.27 cm MITRAL VALVE                TRICUSPID VALVE MV Area (PHT): 4.89 cm     TR Peak grad:   14.6 mmHg MV Decel Time: 155 msec     TR Vmax:        191.00 cm/s MV E velocity: 105.00 cm/s MV A velocity: 152.00 cm/s  SHUNTS MV  E/A ratio:  0.69         Systemic VTI:  0.27 m                             Systemic Diam: 2.00 cm Isaias Cowman MD Electronically signed by Isaias Cowman MD Signature Date/Time: 05/07/2022/1:15:16 PM    Final    CT Thoracic Spine Wo Contrast  Result Date: 05/07/2022 CLINICAL DATA:  Spine fracture, thoracic, traumatic EXAM: CT THORACIC SPINE WITHOUT CONTRAST TECHNIQUE: Multidetector CT images of the thoracic were obtained using the standard protocol without intravenous contrast. RADIATION DOSE REDUCTION: This exam was performed according to the departmental dose-optimization program which includes automated exposure control, adjustment  of the mA and/or kV according to patient size and/or use of iterative reconstruction technique. COMPARISON:  CT chest 04/25/2022 FINDINGS: Alignment: Normal thoracic kyphosis. Mild thoracic dextroscoliosis, apex right at T5. No listhesis. Vertebrae: There is an acute compression deformity of T9 with approximately 40% loss of height. No retropulsion. Posterior elements appear intact. Stable remote compression deformities of T7 and T12 with mild loss of height. Osseous structures are diffusely osteopenic. No lytic or blastic bone lesion. Paraspinal and other soft tissues: Paraspinal infiltration is seen at T8-9 in keeping with paraspinal edema or interstitial hemorrhage. No loculated paraspinal fluid collection identified. No canal hematoma. Additional thoracic findings are better described on accompanying CT examination of the chest. Disc levels: Endplate remodeling at T1-T3 and vacuum disc phenomena at T11-12 is present in keeping with changes of mild degenerative disc disease. Intervertebral disc heights are preserved. No significant canal stenosis. No significant neuroforaminal narrowing. IMPRESSION: 1. Acute compression deformity of T9 with approximately 40% loss of height. No retropulsion. No listhesis. Posterior elements appear intact. 2. Stable remote compression  deformities of T7 and T12 with mild loss of height. 3. Additional thoracic findings are better described on accompanying CT examination of the chest. Electronically Signed   By: Ashesh  Parikh M.D.   On: 05/07/2022 00:21   CT Angio Chest PE W and/or Wo Contrast  Result Date: 05/07/2022 CLINICAL DATA:  Pulmonary embolism (PE) suspected, high prob. Dyspnea, fever, cough EXAM: CT ANGIOGRAPHY CHEST WITH CONTRAST TECHNIQUE: Multidetector CT imaging of the chest was performed using the standard protocol during bolus administration of intravenous contrast. Multiplanar CT image reconstructions and MIPs were obtained to evaluate the vascular anatomy. RADIATION DOSE REDUCTION: This exam was performed according to the departmental dose-optimization program which includes automated exposure control, adjustment of the mA and/or kV according to patient size and/or use of iterative reconstruction technique. CONTRAST:  60mL OMNIPAQUE IOHEXOL 350 MG/ML SOLN COMPARISON:  04/25/2022 FINDINGS: Cardiovascular: There is adequate opacification of the pulmonary arterial tree. No intraluminal filling defect identified to suggest acute pulmonary embolism. Central pulmonary arteries are enlarged in keeping with changes of pulmonary arterial hypertension. No significant coronary artery calcification. Mild global cardiomegaly. No pericardial effusion. Mild atherosclerotic calcification within the thoracic aorta. No aortic aneurysm. Mediastinum/Nodes: No enlarged mediastinal, hilar, or axillary lymph nodes. Thyroid gland, trachea, and esophagus demonstrate no significant findings. Lungs/Pleura: Focal consolidation has developed within the right lower lobe since prior examination in keeping with changes of acute lobar pneumonia in the appropriate clinical setting. Scattered nodular sparse infiltrate is also noted within the right middle lobe and right upper lobe. Subsegmental atelectasis within the lingula. No pneumothorax or pleural  effusion. Upper Abdomen: No acute abnormality. Musculoskeletal: Interval development of an acute compression deformity of T9 with approximately 40% loss of height. No listhesis. No retropulsion. Posterior elements of T9 appear intact. Stable remote superior endplate fractures of T7 and T12. Mild paravertebral infiltration at T12 in keeping with mild paraspinal edema or interstitial hemorrhage. No lytic or blastic bone lesion. Review of the MIP images confirms the above findings. IMPRESSION: 1. No pulmonary embolism. 2. Interval development of acute lobar pneumonia within the right lower lobe. Scattered sparse infiltrates also noted within the right middle and upper lobe 3. Interval development of an acute compression deformity of T9 with approximately 40% loss of height. No listhesis. No retropulsion. 4. Stable remote superior endplate fractures of T7 and T12. 5. Mild global cardiomegaly. Morphologic changes in keeping with pulmonary arterial hypertension. Aortic Atherosclerosis (ICD10-I70.0). Electronically Signed     By: Fidela Salisbury M.D.   On: 05/07/2022 00:11   DG Chest 2 View  Result Date: 05/06/2022 CLINICAL DATA:  sob EXAM: CHEST - 2 VIEW COMPARISON:  Chest x-ray 04/25/2022. FINDINGS: Since CT chest on 04/25/2022, probable acute/interval compression fracture of a lower thoracic vertebral body. Mild streaky bibasilar opacities. No confluent consolidation. No definite pleural effusions or pneumothorax. Cervical and lumbar fusion hardware, partially imaged. Cholecystectomy clips. IMPRESSION: 1. Since CT chest on 04/25/2022, probable recent/interval compression fracture of a lower thoracic vertebral body. Recommend dedicated cross-sectional imaging to confirm and further evaluate. 2. Mild streaky bibasilar opacities, favor atelectasis/scar. Electronically Signed   By: Margaretha Sheffield M.D.   On: 05/06/2022 15:17   US Venous Img Lower Bilateral (DVT)  Result Date: 04/25/2022 CLINICAL DATA:  Positive  D-dimer EXAM: BILATERAL LOWER EXTREMITY VENOUS DOPPLER ULTRASOUND TECHNIQUE: Gray-scale sonography with compression, as well as color and duplex ultrasound, were performed to evaluate the deep venous system(s) from the level of the common femoral vein through the popliteal and proximal calf veins. COMPARISON:  None Available. FINDINGS: VENOUS Normal compressibility of the common femoral, superficial femoral, and popliteal veins, as well as the visualized calf veins. Visualized portions of profunda femoral vein and great saphenous vein unremarkable. No filling defects to suggest DVT on grayscale or color Doppler imaging. Doppler waveforms show normal direction of venous flow, normal respiratory plasticity and response to augmentation. Limited views of the contralateral common femoral vein are unremarkable. OTHER None. Limitations: none IMPRESSION: Negative. Electronically Signed   By: Dorise Bullion III M.D.   On: 04/25/2022 12:31   NM Pulmonary Perfusion  Result Date: 04/25/2022 CLINICAL DATA:  PE suspected. Patient is COVID-19 positive. Shortness of breath. EXAM: NUCLEAR MEDICINE PERFUSION LUNG SCAN TECHNIQUE: Perfusion images were obtained in multiple projections after intravenous injection of radiopharmaceutical. Ventilation scans intentionally deferred if perfusion scan and chest x-ray adequate for interpretation during COVID 19 epidemic. RADIOPHARMACEUTICALS:  4.31 mCi Tc-60mMAA IV COMPARISON:  CT of the chest April 25, 2022 FINDINGS: No segmental perfusion defects identified. IMPRESSION: No segmental perfusion defects identified. No evidence of pulmonary embolus on this study. Electronically Signed   By: DDorise BullionIII M.D.   On: 04/25/2022 11:50   CT Chest Wo Contrast  Result Date: 04/25/2022 CLINICAL DATA:  81year old female with history of shortness of breath. Recent diagnosis of COVID 2 days ago. EXAM: CT CHEST WITHOUT CONTRAST TECHNIQUE: Multidetector CT imaging of the chest was  performed following the standard protocol without IV contrast. RADIATION DOSE REDUCTION: This exam was performed according to the departmental dose-optimization program which includes automated exposure control, adjustment of the mA and/or kV according to patient size and/or use of iterative reconstruction technique. COMPARISON:  No priors. FINDINGS: Cardiovascular: Heart size is normal. There is no significant pericardial fluid, thickening or pericardial calcification. There is aortic atherosclerosis, as well as atherosclerosis of the great vessels of the mediastinum and the coronary arteries, including calcified atherosclerotic plaque in the left main and left anterior descending coronary arteries. Mediastinum/Nodes: No pathologically enlarged mediastinal or hilar lymph nodes. Please note that accurate exclusion of hilar adenopathy is limited on noncontrast CT scans. Patulous fluid-filled esophagus. Small hiatal hernia. No axillary lymphadenopathy. Lungs/Pleura: Azygous lobe (normal anatomical variant) incidentally noted. Dependent areas of mild subsegmental atelectasis and/or scarring are noted in the lungs bilaterally. No acute consolidative airspace disease. No pleural effusions. 3 mm right upper lobe pulmonary nodule (axial image 64 of series 3). No other suspicious appearing pulmonary nodules or masses  are noted. Small calcified granuloma in the right upper lobe. Upper Abdomen: Aortic atherosclerosis. Status post cholecystectomy. Calcified granulomas in the spleen incidentally noted. Musculoskeletal: Orthopedic fixation hardware in the lower cervical spine incidentally noted. Chronic appearing compression fractures of T7 superior endplate and G18 superior endplate, most severe at T12 where there is up to 25% loss of central vertebral body height. There are no aggressive appearing lytic or blastic lesions noted in the visualized portions of the skeleton. IMPRESSION: 1. No acute findings in the thorax. 2. Small  3 mm right upper lobe pulmonary nodule, nonspecific, but statistically likely benign. No follow-up needed if patient is low-risk.This recommendation follows the consensus statement: Guidelines for Management of Incidental Pulmonary Nodules Detected on CT Images: From the Fleischner Society 2017; Radiology 2017; 284:228-243. 3. Aortic atherosclerosis, in addition to left main and left anterior descending coronary artery disease. 4. Fluid-filled patulous esophagus. 5. Small hiatal hernia. 6. Additional incidental findings, as above. Aortic Atherosclerosis (ICD10-I70.0). Electronically Signed   By: Vinnie Langton M.D.   On: 04/25/2022 09:04   DG Chest Port 1 View  Result Date: 04/25/2022 CLINICAL DATA:  SOB EXAM: PORTABLE CHEST 1 VIEW COMPARISON:  04/23/2022 FINDINGS: The heart size and mediastinal contours are within normal limits. Both lungs are clear. No pneumothorax or pleural effusion. Aorta is calcified the visualized skeletal structures are unremarkable. IMPRESSION: No active disease. Electronically Signed   By: Sammie Bench M.D.   On: 04/25/2022 07:13   DG Chest 2 View  Result Date: 04/23/2022 CLINICAL DATA:  Hypotension, shortness of breath EXAM: CHEST - 2 VIEW COMPARISON:  Chest radiograph 11/27/2018 FINDINGS: The cardiomediastinal silhouette is normal. There is no focal consolidation or pulmonary edema. There is no pleural effusion or pneumothorax. There is no acute osseous abnormality. IMPRESSION: No radiographic evidence of acute cardiopulmonary process. Electronically Signed   By: Valetta Mole M.D.   On: 04/23/2022 10:50    Microbiology: Results for orders placed or performed during the hospital encounter of 05/06/22  Resp panel by RT-PCR (RSV, Flu A&B, Covid) Anterior Nasal Swab     Status: Abnormal   Collection Time: 05/06/22  2:52 PM   Specimen: Anterior Nasal Swab  Result Value Ref Range Status   SARS Coronavirus 2 by RT PCR POSITIVE (A) NEGATIVE Final    Comment:  (NOTE) SARS-CoV-2 target nucleic acids are DETECTED.  The SARS-CoV-2 RNA is generally detectable in upper respiratory specimens during the acute phase of infection. Positive results are indicative of the presence of the identified virus, but do not rule out bacterial infection or co-infection with other pathogens not detected by the test. Clinical correlation with patient history and other diagnostic information is necessary to determine patient infection status. The expected result is Negative.  Fact Sheet for Patients: EntrepreneurPulse.com.au  Fact Sheet for Healthcare Providers: IncredibleEmployment.be  This test is not yet approved or cleared by the [place] FDA and  has been authorized for detection and/or diagnosis of SARS-CoV-2 by FDA under an Emergency Use Authorization (EUA).  This EUA will remain in effect (meaning this test can be used) for the duration of  the COVID-19 declaration under Section 564(b)(1) of the A ct, 21 U.S.C. section 360bbb-3(b)(1), unless the authorization is terminated or revoked sooner.     Influenza A by PCR NEGATIVE NEGATIVE Final   Influenza B by PCR NEGATIVE NEGATIVE Final    Comment: (NOTE) The Xpert Xpress SARS-CoV-2/FLU/RSV plus assay is intended as an aid in the diagnosis of influenza from Nasopharyngeal swab  specimens and should not be used as a sole basis for treatment. Nasal washings and aspirates are unacceptable for Xpert Xpress SARS-CoV-2/FLU/RSV testing.  Fact Sheet for Patients: EntrepreneurPulse.com.au  Fact Sheet for Healthcare Providers: IncredibleEmployment.be  This test is not yet approved or cleared by the [place] FDA and has been authorized for detection and/or diagnosis of SARS-CoV-2 by FDA under an Emergency Use Authorization (EUA). This EUA will remain in effect (meaning this test can be used) for the duration of the COVID-19  declaration under Section 564(b)(1) of the Act, 21 U.S.C. section 360bbb-3(b)(1), unless the authorization is terminated or revoked.     Resp Syncytial Virus by PCR NEGATIVE NEGATIVE Final    Comment: (NOTE) Fact Sheet for Patients: EntrepreneurPulse.com.au  Fact Sheet for Healthcare Providers: IncredibleEmployment.be  This test is not yet approved or cleared by the [place] FDA and has been authorized for detection and/or diagnosis of SARS-CoV-2 by FDA under an Emergency Use Authorization (EUA). This EUA will remain in effect (meaning this test can be used) for the duration of the COVID-19 declaration under Section 564(b)(1) of the Act, 21 U.S.C. section 360bbb-3(b)(1), unless the authorization is terminated or revoked.  Performed at [facility], [address] [place], [place] [address]     Labs: CBC: Recent Labs  Lab 05/06/22 1449 05/07/22 1057 05/08/22 1203 05/09/22 0624 05/10/22 0211  WBC 14.8* 16.7* 17.0* 11.9* 12.2*  NEUTROABS  --  16.2* 16.1* 11.4* 11.6*  HGB 9.0* 8.0* 8.0* 7.7* 7.5*  HCT 28.8* 25.5* 26.3* 24.1* 23.6*  MCV 104.0* 103.2* 104.0* 100.8* 100.4*  PLT 209 190 179 163 224   Basic Metabolic Panel: Recent Labs  Lab 05/06/22 1449 05/07/22 1057 05/08/22 0648 05/09/22 0624 05/10/22 0211  NA 134* 134* 133* 134* 132*  K 4.4 4.8 4.2 4.3 3.9  CL 100 101 102 100 97*  CO2 23 21* 20* 25 24  GLUCOSE 102* 105* 114* 131* 130*  BUN 40* 45* 51* 52* 54*  CREATININE 1.45* 1.60* 1.48* 1.36* 1.43*  CALCIUM 9.1 8.6* 8.2* 8.3* 8.1*  MG  --  2.1 2.2 2.2 2.0  PHOS  --  5.0* 4.6 3.9 3.0   Liver Function Tests: Recent Labs  Lab 05/06/22 1449 05/07/22 1057 05/08/22 0648 05/09/22 0624 05/10/22 0211  AST 45* 49* 42* 36 28  ALT _0 ALKPHOS 64 62 60 59 55  BILITOT 1.0 1.1 0.5 0.4 0.6  PROT 6.7 6.1* 5.7* 5.6* 5.4*  ALBUMIN 3.2* 2.8* 2.6* 2.5* 2.5*   CBG: No results for input(s): "GLUCAP" in  the last 168 hours.  Discharge time spent: {LESS THAN/GREATER MGNO:03704} 30 minutes.  Signed: Ezekiel Slocumb, DO [facility] 05/10/2022

## 2022-05-10 NOTE — TOC Initial Note (Signed)
Transition of Care [facility]) - Initial/Assessment Note    Patient Details  Name: Sydney Gillespie MRN: [id number] Date of Birth: [birth year]  [facility] of [facility]) CM/SW Contact:    Eileen Stanford, LCSW Phone Number: 05/10/2022, 3:00 PM  Clinical Narrative:     CSW spoke with pt to complete high risk readmission screening to identify any psychosocial barriers, if any. Mbr states her Granddaughter is going to live with her after dc. Pt states she drives herself to apt's unless she is told not to by providers and her family will assist. Pt gets her medications filled at the [facility] in [place]. Pt states she has a walker at home. Pt states she is active with [facility] (PT, OT, and Aid- confirmed with Corene Cornea with [facility]) Pt declines needing any additional DME.               Expected Discharge Plan: [place] Barriers to Discharge: Continued Medical Work up   Patient Goals and CMS Choice Patient states their goals for this hospitalization and ongoing recovery are:: to go home   Choice offered to / list presented to : Patient      Expected Discharge Plan and Services In-house Referral: Clinical Social Work   Post Acute Care Choice: [place] arrangements for the past 2 months: Single Family Home                           HH Arranged: PT, OT, Nurse's Aide [place] Agency: [place] ([facility]) Date HH Agency Contacted: 05/10/22 Time HH Agency Contacted: 1500 Representative spoke with at [place]  Prior Living Arrangements/Services Living arrangements for the past 2 months: [place] with:: Adult Children Patient language and need for interpreter reviewed:: Yes Do you feel safe going back to the place where you live?: Yes      Need for Family Participation in Patient Care: Yes (Comment) Care giver support system in place?: Yes (comment) Current home services: Home OT, Home PT, Homehealth aide Criminal Activity/Legal  Involvement Pertinent to Current Situation/Hospitalization: No - Comment as needed  Activities of Daily Living      Permission Sought/Granted Permission sought to share information with : Family Supports Permission granted to share information with : Yes, Verbal Permission Granted  Share Information with NAME: jennifer     Permission granted to share info w Relationship: granddaughter     Emotional Assessment Appearance:: Appears stated age Attitude/Demeanor/Rapport: Engaged Affect (typically observed): Accepting Orientation: : Oriented to Self, Oriented to Place, Oriented to  Time, Oriented to Situation Alcohol / Substance Use: Not Applicable Psych Involvement: No (comment)  Admission diagnosis:  Acute respiratory failure with hypoxia (HCC) [J96.01] Closed wedge compression fracture of T9 vertebra, initial encounter ([place]) [S22.070A] Acute dyspnea [R06.00] Pneumonia of right lower lobe due to infectious organism [J18.9] COVID-19 [U07.1] Patient Active Problem List   Diagnosis Date Noted   Acute deep vein thrombosis (DVT) of calf muscle vein of right lower extremity (HCC) 05/10/2022   Compression fracture of body of thoracic vertebra ([place]) 05/07/2022   Pneumonia involving right lung 05/07/2022   Acute respiratory failure with hypoxia (HCC) 05/07/2022   Elevated troponin 05/07/2022   Anemia of chronic kidney failure 04/27/2022   Esophageal stenosis 04/26/2022   COVID-19 virus infection 04/25/2022   Multiple myeloma ([place]) 04/25/2022   Hypothyroidism 04/25/2022   Depression with anxiety 04/25/2022   Rheumatoid arthritis ([place]) 04/25/2022   Iron  deficiency anemia 04/25/2022   CKD (chronic kidney disease), stage IV (HCC) 04/25/2022   Syncope 09/01/2018   Pneumonia due to COVID-19 virus 09/01/2018   Anemia due to stage 3 chronic kidney disease ([place]) 12/28/2016   Lumbar radiculopathy 06/18/2016   Fatigue 01/29/2016   Low serum vitamin D 01/29/2016   Multiple myeloma not having  achieved remission ([place]) 12/18/2015   Spondylolisthesis of lumbar region 11/25/2015   Soft tissue lesion of shoulder region 09/11/2015   Acid reflux 09/11/2015   Lumbar and sacral osteoarthritis 07/22/2015   Degeneration of intervertebral disc of lumbar region 10/18/2013   Neuritis or radiculitis due to rupture of lumbar intervertebral disc 10/18/2013   Degenerative arthritis of lumbar spine 10/18/2013   Allergic rhinitis 09/27/2013   HLD (hyperlipidemia) 09/27/2013   Awareness of heartbeats 09/27/2013   Gout 09/21/2013   Abnormal result of Mantoux test 09/21/2013   Impaired renal function 09/21/2013   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   OP (osteoporosis) 54/56/2563   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   PCP:  [name], MD Pharmacy:   [facility] [place], [place] - [address] AT [address] [place] [address] Phone: [phone] Fax: [place] [place] [place] [address] Phone: [phone] Fax: [phone]  [facility], [place] - [address] [place] [address] Phone: [phone] Fax: [phone]     Social Determinants of Health (SDOH) Social History: [place]: No Food Insecurity (04/25/2022)  Housing: Low Risk  (04/25/2022)  Transportation Needs: No Transportation Needs (04/25/2022)  Utilities: Not At Risk (04/25/2022)  Tobacco Use: Low Risk  (05/07/2022)   SDOH Interventions:     Readmission Risk Interventions    05/10/2022    2:56 PM  Readmission Risk Prevention Plan  Transportation Screening Complete  Medication Review ([place]) Complete  PCP or Specialist appointment within 3-5 days of discharge Complete  HRI or [place] Complete  SW Recovery Care/Counseling Consult Complete  [place] Not Applicable

## 2022-05-10 NOTE — Evaluation (Signed)
Physical Therapy Evaluation Patient Details Name: Sydney Gillespie MRN: [id number] DOB: 1941/01/21 Today's Date: 05/10/2022  History of Present Illness  Pt is an 81 y/o F admitted on 05/06/22. Pt with recent covid infection 04/23/22. Pt presents now with worsening SOB, incessant coughing, fevers, & chills. CTA that was done is consistent with right-sided lobar pneumonia the right lower lobe with scattered sparse infiltrates also noted with the right middle and upper lobe and also acute T9 fracture with 40% loss of height as well as stable remote superior endplate fractures of T7 and T12. PMH: multiple myeloma s/p bone marrow transplatn & off immunotherapy x 3 weeks, RA, vertigo, anemia, CKD IV, hypothyroidism, gout, depression, anxiety  Clinical Impression  Pt seen for PT evaluation with pt agreeable to tx. Pt reports prior to first admission she was independent without AD, but was sent home with RW after being so weak following last admission although pt wasn't using it. PT educates pt on use of TLSO for comfort PRN & pt with brace donned upon PT arrival & throughout session. Pt ambulates in room without AD & min assist with frequent LOB & min assist to correct. Provided pt with RW & pt able to ambulate with supervision with AD. Anticipate pt can d/c home with HHPT f/u. Will continue to follow pt acutely to address strengthening, balance, and gait with LRAD.     Recommendations for follow up therapy are one component of a multi-disciplinary discharge planning process, led by the attending physician.  Recommendations may be updated based on patient status, additional functional criteria and insurance authorization.  Follow Up Recommendations Home health PT      Assistance Recommended at Discharge Intermittent Supervision/Assistance  Patient can return home with the following  A little help with walking and/or transfers;A little help with bathing/dressing/bathroom;Assistance with  cooking/housework    Equipment Recommendations None recommended by PT  Recommendations for Other Services       Functional Status Assessment Patient has had a recent decline in their functional status and demonstrates the ability to make significant improvements in function in a reasonable and predictable amount of time.     Precautions / Restrictions Precautions Precautions: Fall Restrictions Weight Bearing Restrictions: No      Mobility  Bed Mobility               General bed mobility comments: not tested, pt received & left sitting in recliner    Transfers Overall transfer level: Needs assistance Equipment used: None Transfers: Sit to/from Stand Sit to Stand: Min guard                Ambulation/Gait Ambulation/Gait assistance: Min assist Gait Distance (Feet): 30 Feet (+ 20 ft) Assistive device: None, Rolling walker (2 wheels)   Gait velocity: decreased     General Gait Details: Pt with impaired balance when ambulating without AD, min assist to correct LOB. Supervision when ambulating with RW with cuing to ambulate within base of AD  Stairs            Wheelchair Mobility    Modified Rankin (Stroke Patients Only)       Balance Overall balance assessment: Needs assistance Sitting-balance support: Feet supported Sitting balance-Leahy Scale: Good     Standing balance support: No upper extremity supported, During functional activity Standing balance-Leahy Scale: Poor  Pertinent Vitals/Pain Pain Assessment Pain Assessment: No/denies pain    Home Living Family/patient expects to be discharged to:: Private residence Living Arrangements: Other relatives (granddaughter) Available Help at Discharge: Family;Available PRN/intermittently (granddaughter works during the days) Type of Home: House Home Access: Stairs to enter Entrance Stairs-Rails: Right;Left;Can reach both Technical brewer of  Steps: 5-8   Home Layout: One level Home Equipment: Conservation officer, nature (2 wheels)      Prior Function Prior Level of Function : Independent/Modified Independent             Mobility Comments: Pt reports she is ambulatory without AD at baseline but after first admission with covid was very weak & using walker PRN. ADLs Comments: generally MOD I -I in ADL, recent assist for IADL, some ADL due to acute illnesses     Hand Dominance   Dominant Hand: Right    Extremity/Trunk Assessment   Upper Extremity Assessment Upper Extremity Assessment: Overall WFL for tasks assessed    Lower Extremity Assessment Lower Extremity Assessment: Generalized weakness    Cervical / Trunk Assessment Cervical / Trunk Assessment: Normal  Communication   Communication: No difficulties  Cognition Arousal/Alertness: Awake/alert Behavior During Therapy: WFL for tasks assessed/performed Overall Cognitive Status: Within Functional Limits for tasks assessed                                          General Comments General comments (skin integrity, edema, etc.): Pt with TLSO donned upon PT arrival & throughout session.    Exercises     Assessment/Plan    PT Assessment Patient needs continued PT services  PT Problem List Decreased activity tolerance;Decreased balance;Decreased mobility;Decreased coordination;Decreased safety awareness;Decreased knowledge of precautions;Cardiopulmonary status limiting activity;Decreased strength;Decreased knowledge of use of DME       PT Treatment Interventions DME instruction;Gait training;Stair training;Functional mobility training;Therapeutic activities;Therapeutic exercise;Balance training;Patient/family education;Neuromuscular re-education    PT Goals (Current goals can be found in the Care Plan section)  Acute Rehab PT Goals Patient Stated Goal: to return home PT Goal Formulation: With patient/family Time For Goal Achievement:  05/24/22 Potential to Achieve Goals: Good    Frequency 7X/week     Co-evaluation               AM-PAC PT "6 Clicks" Mobility  Outcome Measure Help needed turning from your back to your side while in a flat bed without using bedrails?: A Little Help needed moving from lying on your back to sitting on the side of a flat bed without using bedrails?: A Little Help needed moving to and from a bed to a chair (including a wheelchair)?: A Little Help needed standing up from a chair using your arms (e.g., wheelchair or bedside chair)?: A Little Help needed to walk in hospital room?: A Little Help needed climbing 3-5 steps with a railing? : A Little 6 Click Score: 18    End of Session Equipment Utilized During Treatment: Back brace Activity Tolerance: Patient tolerated treatment well Patient left: in chair;with chair alarm set;with call bell/phone within reach Nurse Communication: Mobility status PT Visit Diagnosis: Unsteadiness on feet (R26.81);Muscle weakness (generalized) (M62.81);Difficulty in walking, not elsewhere classified (R26.2)    Time: 1740-8144 PT Time Calculation (min) (ACUTE ONLY): 12 min   Charges:   PT Evaluation $PT Eval Low Complexity: [place], PT, DPT 05/10/22, 12:36  PM   [name] 05/10/2022, 12:34 PM

## 2022-05-11 ENCOUNTER — Encounter: Payer: Self-pay | Admitting: Internal Medicine

## 2022-05-12 ENCOUNTER — Telehealth: Payer: Self-pay | Admitting: *Deleted

## 2022-05-12 NOTE — Telephone Encounter (Signed)
Patient called reporting that she has been in and out of the hospital for the past month with COVID and Pneumonia and is asking if she needs to come to her appointment on 1/4 for lab and [place]. Please advise

## 2022-05-13 ENCOUNTER — Encounter: Payer: Self-pay | Admitting: Internal Medicine

## 2022-05-13 NOTE — Telephone Encounter (Signed)
Dr. B advise patient not get treatment tomorrow.  Spoke to patient and she agrees to cancel appointments for tomorrow and keep appts for next week.    Please cx appts on 05/14/22.

## 2022-05-13 NOTE — Telephone Encounter (Signed)
Patient has agreed to keep appointment tomorrow as per Dr B recommendation

## 2022-05-14 ENCOUNTER — Inpatient Hospital Stay: Payer: Medicare Other

## 2022-05-14 ENCOUNTER — Other Ambulatory Visit: Payer: Self-pay | Admitting: Pharmacist

## 2022-05-14 DIAGNOSIS — C9000 Multiple myeloma not having achieved remission: Secondary | ICD-10-CM

## 2022-05-14 MED ORDER — LENALIDOMIDE 10 MG PO CAPS: 10.0000 mg | ORAL_CAPSULE | Freq: Every day | ORAL | 0 refills | Status: DC

## 2022-05-14 NOTE — Progress Notes (Signed)
[facility] stated that [employer] was unable to deliver the Revlimid to patient. They are requesting a new rx to reprocess the prescription. Rx resent.  Delivery issues were likely due to patients recent trips to the hospital and not being home.

## 2022-05-16 ENCOUNTER — Telehealth: Payer: Self-pay | Admitting: Internal Medicine

## 2022-05-16 NOTE — Telephone Encounter (Signed)
I spoke to pt to check on pt-  # recent COVID infection- cough better  # vertebral compression fractures/ rib fractures- on narcotics.  Constipation- sec to narcotics- recommend continue laxative/ mirlax stool softeners.   Dysphagia- ? EGD vs barium esophagogram. Will discuss further at next visit.  GB

## 2022-05-18 ENCOUNTER — Encounter: Payer: Self-pay | Admitting: Internal Medicine

## 2022-05-18 ENCOUNTER — Other Ambulatory Visit: Payer: Self-pay

## 2022-05-19 ENCOUNTER — Other Ambulatory Visit: Payer: Self-pay | Admitting: Pharmacist

## 2022-05-19 DIAGNOSIS — C9000 Multiple myeloma not having achieved remission: Secondary | ICD-10-CM

## 2022-05-19 MED ORDER — LENALIDOMIDE 10 MG PO CAPS: 10.0000 mg | ORAL_CAPSULE | Freq: Every day | ORAL | 0 refills | Status: DC

## 2022-05-21 ENCOUNTER — Inpatient Hospital Stay: Payer: Medicare Other

## 2022-05-21 ENCOUNTER — Other Ambulatory Visit: Payer: Medicare Other

## 2022-05-21 ENCOUNTER — Inpatient Hospital Stay: Payer: Medicare Other | Admitting: Pharmacist

## 2022-05-21 ENCOUNTER — Inpatient Hospital Stay: Payer: Medicare Other | Attending: Internal Medicine | Admitting: Internal Medicine

## 2022-05-21 ENCOUNTER — Encounter: Payer: Self-pay | Admitting: Internal Medicine

## 2022-05-21 VITALS — BP 105/63 | HR 81 | Temp 98.7°F | Resp 18 | Ht 65.0 in | Wt 149.3 lb

## 2022-05-21 DIAGNOSIS — C9002 Multiple myeloma in relapse: Secondary | ICD-10-CM | POA: Insufficient documentation

## 2022-05-21 DIAGNOSIS — E46 Unspecified protein-calorie malnutrition: Secondary | ICD-10-CM | POA: Insufficient documentation

## 2022-05-21 DIAGNOSIS — R1310 Dysphagia, unspecified: Secondary | ICD-10-CM | POA: Diagnosis not present

## 2022-05-21 DIAGNOSIS — R0602 Shortness of breath: Secondary | ICD-10-CM | POA: Diagnosis not present

## 2022-05-21 DIAGNOSIS — Z9481 Bone marrow transplant status: Secondary | ICD-10-CM | POA: Diagnosis not present

## 2022-05-21 DIAGNOSIS — M549 Dorsalgia, unspecified: Secondary | ICD-10-CM | POA: Insufficient documentation

## 2022-05-21 DIAGNOSIS — Z5111 Encounter for antineoplastic chemotherapy: Secondary | ICD-10-CM | POA: Diagnosis present

## 2022-05-21 DIAGNOSIS — Z79899 Other long term (current) drug therapy: Secondary | ICD-10-CM | POA: Diagnosis not present

## 2022-05-21 DIAGNOSIS — D649 Anemia, unspecified: Secondary | ICD-10-CM | POA: Insufficient documentation

## 2022-05-21 DIAGNOSIS — F419 Anxiety disorder, unspecified: Secondary | ICD-10-CM | POA: Insufficient documentation

## 2022-05-21 DIAGNOSIS — E559 Vitamin D deficiency, unspecified: Secondary | ICD-10-CM | POA: Diagnosis not present

## 2022-05-21 DIAGNOSIS — Z7961 Long term (current) use of immunomodulator: Secondary | ICD-10-CM | POA: Insufficient documentation

## 2022-05-21 DIAGNOSIS — M791 Myalgia, unspecified site: Secondary | ICD-10-CM | POA: Diagnosis not present

## 2022-05-21 DIAGNOSIS — R110 Nausea: Secondary | ICD-10-CM | POA: Diagnosis not present

## 2022-05-21 DIAGNOSIS — C9000 Multiple myeloma not having achieved remission: Secondary | ICD-10-CM | POA: Diagnosis not present

## 2022-05-21 DIAGNOSIS — M2550 Pain in unspecified joint: Secondary | ICD-10-CM | POA: Diagnosis not present

## 2022-05-21 DIAGNOSIS — G8929 Other chronic pain: Secondary | ICD-10-CM | POA: Diagnosis not present

## 2022-05-21 DIAGNOSIS — Z7901 Long term (current) use of anticoagulants: Secondary | ICD-10-CM | POA: Insufficient documentation

## 2022-05-21 DIAGNOSIS — R5383 Other fatigue: Secondary | ICD-10-CM | POA: Insufficient documentation

## 2022-05-21 DIAGNOSIS — K5900 Constipation, unspecified: Secondary | ICD-10-CM | POA: Diagnosis not present

## 2022-05-21 DIAGNOSIS — Z9484 Stem cells transplant status: Secondary | ICD-10-CM | POA: Diagnosis not present

## 2022-05-21 DIAGNOSIS — R1319 Other dysphagia: Secondary | ICD-10-CM

## 2022-05-21 DIAGNOSIS — M069 Rheumatoid arthritis, unspecified: Secondary | ICD-10-CM | POA: Diagnosis not present

## 2022-05-21 DIAGNOSIS — N183 Chronic kidney disease, stage 3 unspecified: Secondary | ICD-10-CM | POA: Insufficient documentation

## 2022-05-21 LAB — CBC WITH DIFFERENTIAL/PLATELET
Abs Immature Granulocytes: 0.06 10*3/uL (ref 0.00–0.07)
Basophils Absolute: 0 10*3/uL (ref 0.0–0.1)
Basophils Relative: 0 %
Eosinophils Absolute: 0 10*3/uL (ref 0.0–0.5)
Eosinophils Relative: 0 %
HCT: 26.1 % — ABNORMAL LOW (ref 36.0–46.0)
Hemoglobin: 8.3 g/dL — ABNORMAL LOW (ref 12.0–15.0)
Immature Granulocytes: 1 %
Lymphocytes Relative: 7 %
Lymphs Abs: 0.6 10*3/uL — ABNORMAL LOW (ref 0.7–4.0)
MCH: 32.2 pg (ref 26.0–34.0)
MCHC: 31.8 g/dL (ref 30.0–36.0)
MCV: 101.2 fL — ABNORMAL HIGH (ref 80.0–100.0)
Monocytes Absolute: 0.6 10*3/uL (ref 0.1–1.0)
Monocytes Relative: 7 %
Neutro Abs: 6.7 10*3/uL (ref 1.7–7.7)
Neutrophils Relative %: 85 %
Platelets: 239 10*3/uL (ref 150–400)
RBC: 2.58 MIL/uL — ABNORMAL LOW (ref 3.87–5.11)
RDW: 15.5 % (ref 11.5–15.5)
WBC: 8 10*3/uL (ref 4.0–10.5)
nRBC: 0 % (ref 0.0–0.2)

## 2022-05-21 LAB — COMPREHENSIVE METABOLIC PANEL
ALT: 13 U/L (ref 0–44)
AST: 37 U/L (ref 15–41)
Albumin: 3.1 g/dL — ABNORMAL LOW (ref 3.5–5.0)
Alkaline Phosphatase: 79 U/L (ref 38–126)
Anion gap: 13 (ref 5–15)
BUN: 21 mg/dL (ref 8–23)
CO2: 25 mmol/L (ref 22–32)
Calcium: 8.4 mg/dL — ABNORMAL LOW (ref 8.9–10.3)
Chloride: 95 mmol/L — ABNORMAL LOW (ref 98–111)
Creatinine, Ser: 1.52 mg/dL — ABNORMAL HIGH (ref 0.44–1.00)
GFR, Estimated: 34 mL/min — ABNORMAL LOW (ref >60)
Glucose, Bld: 110 mg/dL — ABNORMAL HIGH (ref 70–99)
Potassium: 3.2 mmol/L — ABNORMAL LOW (ref 3.5–5.1)
Sodium: 133 mmol/L — ABNORMAL LOW (ref 135–145)
Total Bilirubin: 0.4 mg/dL (ref 0.3–1.2)
Total Protein: 6.2 g/dL — ABNORMAL LOW (ref 6.5–8.1)

## 2022-05-21 NOTE — Progress Notes (Signed)
Patient not seen because she is not starting Revlimid (lenalidomide) at this time.

## 2022-05-21 NOTE — Progress Notes (Signed)
Increased back pain, 9/10 pain scale today, did take Hydrocodone this morning.  Decrease in appetite (4 lb wt loss) with occasional nausea.

## 2022-05-21 NOTE — Progress Notes (Signed)
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  No evidence of organ dysfunction; continue close surveillance.  # NOV 2023- NOV 2023- REPEAT BONE MARROW-plasma cells cell 10-20%.  Kappa lambda light chain ratio-rising; Lamda light chain- 329. NOV 2023-skeletal survey no lytic lesions.  Worsening renal insufficiency; GFR 20s; and also worsening anemia hemoglobin 9.8; iron saturation 20%.   # DEC 7th, 2023- DARA SQ+ Dex 20 mg; REV- hold cycle #1.   # CKD [~ creat 1.35- 1.7] ; Dr.Singh  # 2012- LEFT KIDNEY ? 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 benign   Multiple myeloma not having achieved remission ([place])  12/18/2015 Initial Diagnosis   Multiple myeloma in remission ([place])   04/16/2022 -  Chemotherapy   Patient is on Treatment Plan : MYELOMA RELAPSED REFRACTORY Daratumumab SQ + Lenalidomide + Dexamethasone (DaraRd) q28d      INTERVAL HISTORY: Ambulating independently.  Accompanied by her granddaughter.   This is a pleasant 82  year-old female patient with above history of relapsed multiple Myeloma [DEC 2023]-currently on daratumumab SQ treatments he is here for follow-up. Has not started Revlimid and has not received yet.   Patient received daratumumab cycle number 1 day one 4 week ago.   Unfortunately post last treatment patient-diagnosed with COVID.   She was admitted to hospital x 2.  Patient also noted to have right lower lobe pneumonia.  Also noted to have T9 compression fracture.  Patient is currently in a brace.    Patient noted to have rib fractures/compression fractures from coughing.  On narcotics.  Increased back pain, 9/10 pain scale today, did take Hydrocodone this morning. Decrease in appetite (4 lb wt loss) with occasional nausea. Complains of constipation.   Patient continues to feel weak.  As per the granddaughter patient seems to be improving although slightly.   Review of Systems  Constitutional:  Positive for malaise/fatigue. Negative for diaphoresis and weight loss.  HENT:  Negative for nosebleeds and sore throat.   Eyes:  Negative for double vision.  Respiratory:  Positive for shortness of breath. Negative for hemoptysis and wheezing.   Cardiovascular:  Negative for chest pain, palpitations, orthopnea and leg swelling.  Gastrointestinal:  Positive for nausea. Negative for abdominal pain, blood in stool, constipation, diarrhea, heartburn, melena and vomiting.  Genitourinary:  Negative for dysuria, frequency and urgency.  Musculoskeletal:  Positive for back pain, joint pain and myalgias.  Skin: Negative.  Negative for itching and rash.  Neurological:  Positive for tingling. Negative for dizziness, focal weakness, weakness and headaches.  Endo/Heme/Allergies:  Does not bruise/bleed easily.  Psychiatric/Behavioral:  Negative for depression. The patient is not nervous/anxious and does not have insomnia.     ALLERGIES:  is allergic to isoniazid.  MEDICATIONS:  Current Outpatient Medications  Medication Sig Dispense Refill   acetaminophen (TYLENOL) 325 MG tablet Take 650 mg by mouth every 6 (six) hours as needed for fever  or headache.     acyclovir (ZOVIRAX) 400 MG tablet Take 1 tablet (400 mg total) by mouth 2 (two) times daily. 60 tablet 4   albuterol (VENTOLIN HFA) 108 (90 Base) MCG/ACT inhaler Inhale 2 puffs into the  lungs every 4 (four) hours as needed for wheezing or shortness of breath. 8 g 0   allopurinol (ZYLOPRIM) 300 MG tablet Take 450 mg by mouth daily.     ALPRAZolam (XANAX) 0.25 MG tablet TAKE 1 TABLET(0.25 MG) BY MOUTH AT BEDTIME AS NEEDED FOR ANXIETY (Patient taking differently: Take 0.25 mg by mouth at bedtime as needed for anxiety or sleep.) 30 tablet 1   APIXABAN (ELIQUIS) VTE STARTER PACK ('10MG'$  AND '5MG'$ ) Take as directed on package: start with two-'5mg'$  tablets twice daily for 7 days. On day 8, switch to one-'5mg'$  tablet twice daily. 1 each 0   Ascorbic Acid (VITAMIN C) 1000 MG tablet Take 1,000 mg by mouth daily.     atenolol (TENORMIN) 50 MG tablet Take 0.5 tablets (25 mg total) by mouth daily. (Patient taking differently: Take 50 mg by mouth daily.) 30 tablet 0   CALCIUM MAGNESIUM ZINC PO Take 1 tablet by mouth in the morning, at noon, and at bedtime.     cetirizine (ZYRTEC) 10 MG tablet Take 10 mg by mouth 2 (two) times daily.      chlorpheniramine-HYDROcodone (TUSSIONEX) 10-8 MG/5ML Take 5 mLs by mouth every 12 (twelve) hours as needed (for cough unrelieved by other medications). May cause drowsiness. 140 mL 0   docusate sodium (COLACE) 100 MG capsule Take 100 mg by mouth 2 (two) times daily.     DULoxetine (CYMBALTA) 20 MG capsule Take 20 mg by mouth 2 (two) times daily.     fluticasone (FLONASE) 50 MCG/ACT nasal spray Place 2 sprays into the nose daily.      furosemide (LASIX) 40 MG tablet Take 1 tablet (40 mg total) by mouth daily. 30 tablet 1   HYDROcodone-acetaminophen (NORCO/VICODIN) 5-325 MG tablet Take 1-2 tablets by mouth every 4 (four) hours as needed for moderate pain. 30 tablet 0   hydroxychloroquine (PLAQUENIL) 200 MG tablet Take 200 mg by mouth 2 (two) times daily.     ipratropium (ATROVENT HFA) 17 MCG/ACT inhaler Inhale 2 puffs into the lungs every 4 (four) hours as needed for wheezing.     Ipratropium-Albuterol (COMBIVENT) 20-100 MCG/ACT AERS respimat Inhale 1 puff into the lungs  every 6 (six) hours as needed for wheezing. 4 g 0   lansoprazole (PREVACID) 15 MG capsule Take 30 mg by mouth 2 (two) times daily before a meal.     levothyroxine (SYNTHROID) 112 MCG tablet Take 112 mcg by mouth daily before breakfast.     MYRBETRIQ 50 MG TB24 tablet Take 50 mg by mouth daily.     ondansetron (ZOFRAN) 8 MG tablet Take 4-8 mg by mouth every 8 (eight) hours as needed for nausea or vomiting.     oxybutynin (DITROPAN-XL) 10 MG 24 hr tablet Take 1 tablet (10 mg total) by mouth daily. 30 tablet 11   Potassium 99 MG TABS Take 1 tablet by mouth daily.     rOPINIRole (REQUIP) 0.5 MG tablet Take 0.75 mg by mouth at bedtime as needed (Restless leg).     Spacer/Aero-Holding Chambers DEVI 1 each by Does not apply route as needed. 1 each 0   tiZANidine (ZANAFLEX) 2 MG tablet Take 2 mg by mouth at bedtime.     vitamin B-12 (CYANOCOBALAMIN) 1000 MCG tablet  Take 1,000 mcg by mouth daily.     zinc gluconate 50 MG tablet Take 50 mg by mouth daily.     benzonatate (TESSALON) 200 MG capsule Take 1 capsule (200 mg total) by mouth 3 (three) times daily as needed for cough. (Patient not taking: Reported on 05/21/2022) 30 capsule 0   guaiFENesin (MUCINEX) 600 MG 12 hr tablet Take 1 tablet (600 mg total) by mouth 2 (two) times daily. (Patient not taking: Reported on 05/21/2022) 14 tablet 0   lenalidomide (REVLIMID) 10 MG capsule Take 1 capsule (10 mg total) by mouth daily. Take for 21 days, then hold for 7 days. Repeat every 28 days. (Patient not taking: Reported on 05/21/2022) 21 capsule 0   No current facility-administered medications for this visit.    PHYSICAL EXAMINATION: ECOG PERFORMANCE STATUS: 0 - Asymptomatic  BP 105/63 (BP Location: Left Arm, Patient Position: Sitting)   Pulse 81   Temp 98.7 F (37.1 C) (Tympanic)   Resp 18   Ht '5\' 5"'$  (1.651 m)   Wt 149 lb 4.8 oz (67.7 kg)   SpO2 95%   BMI 24.84 kg/m   Filed Weights   05/21/22 0900  Weight: 149 lb 4.8 oz (67.7 kg)    Patient  appears weak.   Physical Exam HENT:     Head: Normocephalic and atraumatic.     Mouth/Throat:     Pharynx: No oropharyngeal exudate.  Eyes:     Pupils: Pupils are equal, round, and reactive to light.  Cardiovascular:     Rate and Rhythm: Normal rate and regular rhythm.  Pulmonary:     Effort: No respiratory distress.     Breath sounds: No wheezing.  Abdominal:     General: Bowel sounds are normal. There is no distension.     Palpations: Abdomen is soft. There is no mass.     Tenderness: There is no abdominal tenderness. There is no guarding or rebound.  Musculoskeletal:        General: No tenderness. Normal range of motion.     Cervical back: Normal range of motion and neck supple.  Skin:    General: Skin is warm.  Neurological:     Mental Status: She is alert and oriented to person, place, and time.  Psychiatric:        Mood and Affect: Affect normal.     LABORATORY DATA:  I have reviewed the data as listed    Component Value Date/Time   NA 133 (L) 05/21/2022 0852   NA 142 08/16/2013 1028   K 3.2 (L) 05/21/2022 0852   K 4.5 08/16/2013 1028   CL 95 (L) 05/21/2022 0852   CL 106 08/16/2013 1028   CO2 25 05/21/2022 0852   CO2 27 08/16/2013 1028   GLUCOSE 110 (H) 05/21/2022 0852   GLUCOSE 99 08/16/2013 1028   BUN 21 05/21/2022 0852   BUN 21 (H) 08/16/2013 1028   CREATININE 1.52 (H) 05/21/2022 0852   CREATININE 1.42 (H) 09/07/2014 0954   CALCIUM 8.4 (L) 05/21/2022 0852   CALCIUM 9.3 09/07/2014 0954   PROT 6.2 (L) 05/21/2022 0852   PROT 7.4 08/16/2013 1028   ALBUMIN 3.1 (L) 05/21/2022 0852   ALBUMIN 3.9 08/16/2013 1028   AST 37 05/21/2022 0852   AST 27 08/16/2013 1028   ALT 13 05/21/2022 0852   ALT 15 08/16/2013 1028   ALKPHOS 79 05/21/2022 0852   ALKPHOS 61 08/16/2013 1028   BILITOT 0.4 05/21/2022 0852   BILITOT 0.3 08/16/2013  1028   GFRNONAA 34 (L) 05/21/2022 0852   GFRNONAA 36 (L) 09/07/2014 0954   GFRAA 19 (L) 02/09/2020 1001   GFRAA 42 (L) 09/07/2014  0954    No results found for: "SPEP", "UPEP"  Lab Results  Component Value Date   WBC 8.0 05/21/2022   NEUTROABS 6.7 05/21/2022   HGB 8.3 (L) 05/21/2022   HCT 26.1 (L) 05/21/2022   MCV 101.2 (H) 05/21/2022   PLT 239 05/21/2022      Chemistry      Component Value Date/Time   NA 133 (L) 05/21/2022 0852   NA 142 08/16/2013 1028   K 3.2 (L) 05/21/2022 0852   K 4.5 08/16/2013 1028   CL 95 (L) 05/21/2022 0852   CL 106 08/16/2013 1028   CO2 25 05/21/2022 0852   CO2 27 08/16/2013 1028   BUN 21 05/21/2022 0852   BUN 21 (H) 08/16/2013 1028   CREATININE 1.52 (H) 05/21/2022 0852   CREATININE 1.42 (H) 09/07/2014 0954      Component Value Date/Time   CALCIUM 8.4 (L) 05/21/2022 0852   CALCIUM 9.3 09/07/2014 0954   ALKPHOS 79 05/21/2022 0852   ALKPHOS 61 08/16/2013 1028   AST 37 05/21/2022 0852   AST 27 08/16/2013 1028   ALT 13 05/21/2022 0852   ALT 15 08/16/2013 1028   BILITOT 0.4 05/21/2022 0852   BILITOT 0.3 08/16/2013 1028       RADIOGRAPHIC STUDIES: I have personally reviewed the radiological images as listed and agreed with the findings in the report. No results found.   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  Kappa lambda light chain ratio-rising; Lamda light chain- 329. NOV 2023-skeletal survey no lytic lesions.  Worsening renal insufficiency; GFR 20s; and also worsening anemia hemoglobin 9.8; iron saturation 20%.  Currently on daratumumab subcu with plan to add revlimid with cycle #2.   # HOLD cycle  #1-day 8 today. Labs today reviewed;  given acute issues below [recent COVID/pneumonia/declining performance status/vertebral compression fractures]  # Recent COVID infection/secondary pneumonia  -immunocompromised status/second of multiple myeloma/dara SQ injections- HOLD dara/Revlimid script at this time.   # Compression fractures- on brace [Dr.Jenkins; [place]] rib fractures- on hydrocodone 1 pil BID; Tyelenol prn; Tinazidine BID. Consider adding zometa at next visit.  Check vitamin D levels.  Continue calcium plus vitamin D.  # Malnutrition-weight loss multifactorial-recommend increasing protein intake.  Will refer to nutrition.  # Dysphagia-recommend evaluation with GI - ordered barium esophagogram.   # CKD stage III- IVcreatinine-creatinine GFR-32-[Dr.Singh] STABLE.   # RA- [on Plaquenil]- STABLE.   # Anxiety/nausea- prescribed/refilled  xanax 0.25 mg qhs [further refills to PCP]-continue Zofran as needed. STABLE.  # Vaccination:  flu shot s/p;  Covid booster; RSV- discussed   #DISPOSITION:  # barium esophagogram ASAP # referral to Nutrition/[facility] re: malnutrition # HOLD Dara today- # follow up in 2  week-  MD;  labs-cbc/cmp; HOLD tube; Vit D 25- [place] levels- Dr.B  Cc; Dr.Hande/ Dr.Singh                Cammie Sickle, MD 05/21/2022 4:42 PM

## 2022-05-21 NOTE — Assessment & Plan Note (Addendum)
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  Kappa lambda light chain ratio-rising; Lamda light chain- 329. NOV 2023-skeletal survey no lytic lesions.  Worsening renal insufficiency; GFR 20s; and also worsening anemia hemoglobin 9.8; iron saturation 20%.  Currently on daratumumab subcu with plan to add revlimid with cycle #2.   # HOLD cycle  #1-day 8 today. Labs today reviewed;  given acute issues below [recent COVID/pneumonia/declining performance status/vertebral compression fractures]  # Recent COVID infection/secondary pneumonia -immunocompromised status/second of multiple myeloma/dara SQ injections- HOLD dara/Revlimid script at this time.  Discussed with pharmacy.  # Compression fractures- on brace [Dr.Jenkins; [place]] rib fractures- on hydrocodone 1 pil BID; Tyelenol prn; Tinazidine BID. Consider adding zometa at next visit.  Check vitamin D levels.  Continue calcium plus vitamin D.  # Malnutrition-weight loss multifactorial-recommend increasing protein intake.  Will refer to nutrition.  # Dysphagia-recommend evaluation with GI - ordered barium esophagogram.   # CKD stage III- IVcreatinine-creatinine GFR-32-[Dr.Singh] STABLE.   # RA- [on Plaquenil]- STABLE.   # Anxiety/nausea- prescribed/refilled  xanax 0.25 mg qhs [further refills to PCP]-continue Zofran as needed. STABLE.  # Vaccination:  flu shot s/p;  Covid booster; RSV- discussed   #DISPOSITION:  # barium esophagogram ASAP # referral to Nutrition/J[facility] re: malnutrition # HOLD Dara today- # follow up in 2  week-  MD;  labs-cbc/cmp; HOLD tube; Vit D 25- [place] levels- Dr.B  Cc; Dr.Hande/ Dr.Singh

## 2022-05-22 ENCOUNTER — Other Ambulatory Visit: Payer: Self-pay

## 2022-05-22 LAB — KAPPA/LAMBDA LIGHT CHAINS
Kappa free light chain: 71.9 mg/L — ABNORMAL HIGH (ref 3.3–19.4)
Kappa, lambda light chain ratio: 2.58 — ABNORMAL HIGH (ref 0.26–1.65)
Lambda free light chains: 27.9 mg/L — ABNORMAL HIGH (ref 5.7–26.3)

## 2022-05-26 ENCOUNTER — Other Ambulatory Visit: Payer: Self-pay

## 2022-05-28 ENCOUNTER — Inpatient Hospital Stay: Payer: Medicare Other

## 2022-05-28 LAB — MULTIPLE MYELOMA PANEL, SERUM
Albumin SerPl Elph-Mcnc: 2.9 g/dL (ref 2.9–4.4)
Albumin/Glob SerPl: 1.1 (ref 0.7–1.7)
Alpha 1: 0.4 g/dL (ref 0.0–0.4)
Alpha2 Glob SerPl Elph-Mcnc: 1 g/dL (ref 0.4–1.0)
B-Globulin SerPl Elph-Mcnc: 0.7 g/dL (ref 0.7–1.3)
Gamma Glob SerPl Elph-Mcnc: 0.6 g/dL (ref 0.4–1.8)
Globulin, Total: 2.7 g/dL (ref 2.2–3.9)
IgA: 77 mg/dL (ref 64–422)
IgG (Immunoglobin G), Serum: 665 mg/dL (ref 586–1602)
IgM (Immunoglobulin M), Srm: 29 mg/dL (ref 26–217)
M Protein SerPl Elph-Mcnc: 0.3 g/dL — ABNORMAL HIGH
Total Protein ELP: 5.6 g/dL — ABNORMAL LOW (ref 6.0–8.5)

## 2022-05-28 NOTE — Progress Notes (Signed)
Nutrition Assessment   Reason for Assessment:   Referral for myeloma, malnutrition   ASSESSMENT:   82 year old female with multiple myeloma.  Past medical history of RD, anemia, CKD stage IV, gout, s/p bone marrow transplant 2013, depression.  Noted recent hospital visit for COVID, pneumonia, compression fracture.  Patient on daratumumab (held at last visit), not started revlimid.    Spoke with patient via phone.  Reports that her appetite really has not been good since 2013 after stem cell transplant.  However, recently has only been able to take a few bites of food and feels full.  Reports sometimes having issues swallowing food and liquids.  "Sometimes water feels like it want go down."  Has had to have esophagus stretched in the past.  Has appointment with GI next month.  Usually has a Equate shake for breakfast. Sometimes eats a little something mid-day and other times does not.  Usually eats something for dinner with grand-daughter. Last night ate ham sandwich.  Grand-daughter helps prepare meals as patient in brace with compression fracture.     Medications: Vit C, zinc, lasix, colace, zofran, prevacid, calcium/Mag/Zn   Labs: Na 133, K 3.2, glucose 110, creatinine 1.52, calcium 8.4   Anthropometrics:   Height: 65 inches Weight: 149 lb 4.8 oz on 1/11 155 lb 03/18/22  BMI: 24  4% weight loss in the last 2 months   Estimated Energy Needs  Kcals: 1700-2000 Protein: 85-100 g Fluid: 1700-2000 ml   NUTRITION DIAGNOSIS: Inadequate oral intake related to COVID/pneumonia/multiple myeloma as evidenced by 4% weight loss and decreased intake    INTERVENTION:  Encouraged 350 calorie shake or higher.  Examples discussed Discussed ways to increase calories and protein. Handout will be mailed If intake and weight continue to decline would consider trial of appetite stimulant Contact information mailed   MONITORING, EVALUATION, GOAL: weight trends, intake   Next Visit:  Thursday, Feb 8 phone call  Zakyla Tonche B. Zenia Resides, [place], [place] Registered Dietitian [phone]

## 2022-05-29 ENCOUNTER — Other Ambulatory Visit: Payer: Self-pay

## 2022-06-04 ENCOUNTER — Inpatient Hospital Stay (HOSPITAL_BASED_OUTPATIENT_CLINIC_OR_DEPARTMENT_OTHER): Payer: Medicare Other | Admitting: Internal Medicine

## 2022-06-04 ENCOUNTER — Encounter: Payer: Self-pay | Admitting: Internal Medicine

## 2022-06-04 ENCOUNTER — Inpatient Hospital Stay: Payer: Medicare Other

## 2022-06-04 VITALS — BP 116/73 | HR 73 | Temp 97.5°F | Resp 16 | Ht 65.0 in | Wt 142.1 lb

## 2022-06-04 DIAGNOSIS — C9000 Multiple myeloma not having achieved remission: Secondary | ICD-10-CM

## 2022-06-04 DIAGNOSIS — C9002 Multiple myeloma in relapse: Secondary | ICD-10-CM | POA: Diagnosis not present

## 2022-06-04 DIAGNOSIS — E559 Vitamin D deficiency, unspecified: Secondary | ICD-10-CM

## 2022-06-04 LAB — COMPREHENSIVE METABOLIC PANEL
ALT: 13 U/L (ref 0–44)
AST: 36 U/L (ref 15–41)
Albumin: 3.3 g/dL — ABNORMAL LOW (ref 3.5–5.0)
Alkaline Phosphatase: 75 U/L (ref 38–126)
Anion gap: 14 (ref 5–15)
BUN: 31 mg/dL — ABNORMAL HIGH (ref 8–23)
CO2: 25 mmol/L (ref 22–32)
Calcium: 9.5 mg/dL (ref 8.9–10.3)
Chloride: 95 mmol/L — ABNORMAL LOW (ref 98–111)
Creatinine, Ser: 1.45 mg/dL — ABNORMAL HIGH (ref 0.44–1.00)
GFR, Estimated: 36 mL/min — ABNORMAL LOW (ref >60)
Glucose, Bld: 108 mg/dL — ABNORMAL HIGH (ref 70–99)
Potassium: 3.5 mmol/L (ref 3.5–5.1)
Sodium: 134 mmol/L — ABNORMAL LOW (ref 135–145)
Total Bilirubin: 0.6 mg/dL (ref 0.3–1.2)
Total Protein: 6.8 g/dL (ref 6.5–8.1)

## 2022-06-04 LAB — CBC WITH DIFFERENTIAL/PLATELET
Abs Immature Granulocytes: 0.13 10*3/uL — ABNORMAL HIGH (ref 0.00–0.07)
Basophils Absolute: 0.1 10*3/uL (ref 0.0–0.1)
Basophils Relative: 1 %
Eosinophils Absolute: 0 10*3/uL (ref 0.0–0.5)
Eosinophils Relative: 0 %
HCT: 31.1 % — ABNORMAL LOW (ref 36.0–46.0)
Hemoglobin: 9.2 g/dL — ABNORMAL LOW (ref 12.0–15.0)
Immature Granulocytes: 2 %
Lymphocytes Relative: 11 %
Lymphs Abs: 1 10*3/uL (ref 0.7–4.0)
MCH: 32.3 pg (ref 26.0–34.0)
MCHC: 29.6 g/dL — ABNORMAL LOW (ref 30.0–36.0)
MCV: 109.1 fL — ABNORMAL HIGH (ref 80.0–100.0)
Monocytes Absolute: 0.7 10*3/uL (ref 0.1–1.0)
Monocytes Relative: 8 %
Neutro Abs: 6.7 10*3/uL (ref 1.7–7.7)
Neutrophils Relative %: 78 %
Platelets: 337 10*3/uL (ref 150–400)
RBC: 2.85 MIL/uL — ABNORMAL LOW (ref 3.87–5.11)
RDW: 15.5 % (ref 11.5–15.5)
WBC: 8.5 10*3/uL (ref 4.0–10.5)
nRBC: 0 % (ref 0.0–0.2)

## 2022-06-04 LAB — SAMPLE TO BLOOD BANK

## 2022-06-04 MED ORDER — HYDROCODONE-ACETAMINOPHEN 5-325 MG PO TABS: 1.0000 | ORAL_TABLET | Freq: Three times a day (TID) | ORAL | 0 refills | Status: DC | PRN

## 2022-06-04 MED ORDER — APIXABAN 5 MG PO TABS: 5.0000 mg | ORAL_TABLET | Freq: Two times a day (BID) | ORAL | 1 refills | Status: DC

## 2022-06-04 MED ORDER — OLANZAPINE 5 MG PO TABS: 5.0000 mg | ORAL_TABLET | Freq: Every day | ORAL | 1 refills | Status: DC

## 2022-06-04 NOTE — Assessment & Plan Note (Addendum)
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  Kappa lambda light chain ratio-rising; Lamda light chain- 329. NOV 2023-skeletal survey no lytic lesions.  Worsening renal insufficiency; GFR 20s; and also worsening anemia hemoglobin 9.8; iron saturation 20%.  Currently on daratumumab subcu with plan to add revlimid with cycle #2.   # Continue to HOLD DARA cycle  #1-day 8 today. Labs today reviewed;  given acute issues below [recent COVID/pneumonia/declining performance status/vertebral compression fractures].JAN 2024- M proetin 0.3; Kappa=71 [OCT 2023- BL-0.7;  BL- 329]; reviewed the myeloma panel is better just as 1 treatment.  Recommend holding any therapy at this time.   # Discussed the role of Zometa to decrease skeletal related events [pain; fractures; need for radiation; hypercalcemia].  Discussed the potential side effects including but not limited to-infusion reaction; hypocalcemia; and Osteo-necrosis of jaw. Awaiting dental evaluation next week.   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  New refill sent.  # Recent COVID infection/secondary pneumonia -immunocompromised status/second of multiple myeloma/dara SQ injections- HOLD dara/Revlimid script at this time.  Discussed with pharmacy.  # Compression fractures- on brace [Dr.Jenkins; [place]] rib fractures- on hydrocodone 1 pill q 8 hrs prn; Tyelenol prn; Tinazidine BID. Consider adding zometa at next visit.  Awaiting vitamin D levels.  Continue calcium plus vitamin D.  # Malnutrition-weight loss multifactorial-recommend increasing protein intake.  S/p evaluation with nutrition. Add zyprexa 5 mg/day.   # Dysphagia/weight loss- -recommend evaluation with GI - ordered  barium esophagogram.  Add zyprexa 5 mg/day.   # CKD stage III- IVcreatinine-creatinine GFR-32-[Dr.Singh] stable; recommend adding vit D  # RA- [on Plaquenil]- stable  # Anxiety/nausea- prescribed/refilled  xanax 0.25 mg qhs [further refills to PCP]-continue Zofran as needed. Add zyprexa 5 mg/day.   # Vaccination:  flu shot s/p;  Covid booster; RSV- discussed   #DISPOSITION:  # barium esophagogram ASAP  # follow up in 2  week-  MD;  labs-cbc/cmp; possible Zometa-  Dr.B  # 40 minutes face-to-face with the patient discussing the above plan of care; more than 50% of time spent on prognosis/ natural history; counseling and coordination.   Cc; Dr.Hande/ Dr.Singh

## 2022-06-04 NOTE — Progress Notes (Signed)
Patient twisted her back yesterday and is causing increase in pain today, 7/10 pain scale today.  Episodes of spasms in her back and abdomen after walking that will last 15 minutes.  Will be needing a refill on Hydrocodone.   No appetite with 7 lb wt loss.  Drinking 1-2 boosts a day.    Appt with GI 07/09/22  Has a few days left on Eliquis starter pack and will need rx if she is to continue.

## 2022-06-04 NOTE — Progress Notes (Signed)
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  No evidence of organ dysfunction; continue close surveillance.  # NOV 2023- NOV 2023- REPEAT BONE MARROW-plasma cells cell 10-20%.  Kappa lambda light chain ratio-rising; Lamda light chain- 329. NOV 2023-skeletal survey no lytic lesions.  Worsening renal insufficiency; GFR 20s; and also worsening anemia hemoglobin 9.8; iron saturation 20%.   # DEC 7th, 2023- DARA SQ+ Dex 20 mg; REV- hold cycle #1.   # CKD [~ creat 1.35- 1.7] ; Dr.Singh  # 2012- LEFT KIDNEY ? 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 benign   Multiple myeloma not having achieved remission ([place])  12/18/2015 Initial Diagnosis   Multiple myeloma in remission ([place])   04/16/2022 -  Chemotherapy   Patient is on Treatment Plan : MYELOMA RELAPSED REFRACTORY Daratumumab SQ + Lenalidomide + Dexamethasone (DaraRd) q28d      INTERVAL HISTORY: Ambulating independently.  Accompanied by her granddaughter.   This is a pleasant 82  year-old female patient with above history of relapsed multiple Myeloma [DEC 2023]-currently on daratumumab SQ treatments he is here for follow-up. Has not started Revlimid and has not received yet.  Patient received daratumumab cycle number 1 day one 6 weeks ago.  However, therapy currently on hold because of admission for  COVID-pneumonia/deconditioning.   Patient twisted her back yesterday and is causing increase in pain today, 7/10 pain scale today. Also noted to have T9 compression fracture.  Patient is currently in a brace.   Episodes of spasms in her back and abdomen after walking that will last 15 minutes. Awaiting evaluation with neurosurgery [Dr.jenkins].  Will be needing a refill on Hydrocodone.   No appetite with 7 lb wt loss.  Drinking 1-2 boosts a day. Status post evaluation with nutrition. Awaiting barium esophagogram.    Has a few days left on Eliquis starter pack and will need rx if she is to continue  Complains of constipation. Patient continues to feel weak.  As per the granddaughter patient seems to be improving although slightly.  Review of Systems  Constitutional:  Positive for malaise/fatigue. Negative for diaphoresis and weight loss.  HENT:  Negative for nosebleeds and sore throat.   Eyes:  Negative for double vision.  Respiratory:  Positive for shortness of breath. Negative for hemoptysis and wheezing.   Cardiovascular:  Negative for chest pain, palpitations, orthopnea and leg swelling.  Gastrointestinal:  Positive for nausea. Negative for abdominal pain, blood in stool, constipation, diarrhea, heartburn, melena and vomiting.  Genitourinary:  Negative for dysuria, frequency and urgency.  Musculoskeletal:  Positive for back pain, joint pain and myalgias.  Skin: Negative.  Negative for itching and rash.  Neurological:  Positive for tingling. Negative for dizziness, focal weakness, weakness and headaches.  Endo/Heme/Allergies:  Does not bruise/bleed easily.  Psychiatric/Behavioral:  Negative for depression. The patient is not nervous/anxious and does not have insomnia.     ALLERGIES:  is allergic to  isoniazid.  MEDICATIONS:  Current Outpatient Medications  Medication Sig Dispense Refill   acetaminophen (TYLENOL) 325 MG tablet Take 650 mg by mouth every 6 (six) hours as needed for fever  or headache.     acyclovir (ZOVIRAX) 400 MG tablet Take 1 tablet (400 mg total) by mouth 2 (two) times daily. 60 tablet 4   albuterol (VENTOLIN HFA) 108 (90 Base) MCG/ACT inhaler Inhale 2 puffs into the lungs every 4 (four) hours as needed for wheezing or shortness of breath. 8 g 0   allopurinol (ZYLOPRIM) 300 MG tablet Take 450 mg by mouth daily.     ALPRAZolam (XANAX) 0.25 MG tablet TAKE 1 TABLET(0.25 MG) BY MOUTH AT BEDTIME AS NEEDED FOR ANXIETY (Patient taking differently: Take 0.25 mg by mouth at bedtime as needed for anxiety or sleep.) 30 tablet 1   apixaban (ELIQUIS) 5 MG TABS tablet Take 1 tablet (5 mg total) by mouth 2 (two) times daily. 60 tablet 1   Ascorbic Acid (VITAMIN C) 1000 MG tablet Take 1,000 mg by mouth daily.     CALCIUM MAGNESIUM ZINC PO Take 1 tablet by mouth in the morning, at noon, and at bedtime.     cetirizine (ZYRTEC) 10 MG tablet Take 10 mg by mouth 2 (two) times daily.      docusate sodium (COLACE) 100 MG capsule Take 100 mg by mouth 2 (two) times daily.     DULoxetine (CYMBALTA) 20 MG capsule Take 20 mg by mouth 2 (two) times daily.     fluticasone (FLONASE) 50 MCG/ACT nasal spray Place 2 sprays into the nose daily.      furosemide (LASIX) 40 MG tablet Take 1 tablet (40 mg total) by mouth daily. 30 tablet 1   hydroxychloroquine (PLAQUENIL) 200 MG tablet Take 200 mg by mouth 2 (two) times daily.     ipratropium (ATROVENT HFA) 17 MCG/ACT inhaler Inhale 2 puffs into the lungs every 4 (four) hours as needed for wheezing.     Ipratropium-Albuterol (COMBIVENT) 20-100 MCG/ACT AERS respimat Inhale 1 puff into the lungs every 6 (six) hours as needed for wheezing. 4 g 0   lansoprazole (PREVACID) 15 MG capsule Take 30 mg by mouth 2 (two) times daily before a meal.     levothyroxine (SYNTHROID) 112 MCG tablet Take 112 mcg by mouth daily before breakfast.     OLANZapine (ZYPREXA) 5 MG tablet Take 1 tablet (5 mg total) by mouth at bedtime. 30 tablet 1   ondansetron (ZOFRAN) 8 MG  tablet Take 4-8 mg by mouth every 8 (eight) hours as needed for nausea or vomiting.     oxybutynin (DITROPAN-XL) 10 MG 24 hr tablet Take 1 tablet (10 mg total) by mouth daily. 30 tablet 11   Potassium 99 MG TABS Take 1 tablet by mouth daily.     rOPINIRole (REQUIP) 0.5 MG tablet Take 0.75 mg by mouth at bedtime as needed (Restless leg).     Spacer/Aero-Holding Chambers DEVI 1 each by Does not apply route as needed. 1 each 0   tiZANidine (ZANAFLEX) 2 MG tablet Take 2 mg by mouth at bedtime.     vitamin B-12 (CYANOCOBALAMIN) 1000 MCG tablet Take 1,000 mcg by mouth daily.     zinc gluconate 50 MG tablet Take 50 mg by mouth daily.     atenolol (TENORMIN) 50 MG tablet Take 0.5 tablets (25 mg total) by mouth daily. (Patient not taking: Reported on 06/04/2022) 30 tablet 0   benzonatate (TESSALON) 200 MG capsule Take 1  capsule (200 mg total) by mouth 3 (three) times daily as needed for cough. (Patient not taking: Reported on 05/21/2022) 30 capsule 0   chlorpheniramine-HYDROcodone (TUSSIONEX) 10-8 MG/5ML Take 5 mLs by mouth every 12 (twelve) hours as needed (for cough unrelieved by other medications). May cause drowsiness. 140 mL 0   guaiFENesin (MUCINEX) 600 MG 12 hr tablet Take 1 tablet (600 mg total) by mouth 2 (two) times daily. (Patient not taking: Reported on 05/21/2022) 14 tablet 0   HYDROcodone-acetaminophen (NORCO/VICODIN) 5-325 MG tablet Take 1 tablet by mouth every 8 (eight) hours as needed for moderate pain. 60 tablet 0   lenalidomide (REVLIMID) 10 MG capsule Take 1 capsule (10 mg total) by mouth daily. Take for 21 days, then hold for 7 days. Repeat every 28 days. (Patient not taking: Reported on 05/21/2022) 21 capsule 0   MYRBETRIQ 50 MG TB24 tablet Take 50 mg by mouth daily. (Patient not taking: Reported on 06/04/2022)     No current facility-administered medications for this visit.    PHYSICAL EXAMINATION: ECOG PERFORMANCE STATUS: 0 - Asymptomatic  BP 116/73 (BP Location: Left Arm, Patient  Position: Sitting)   Pulse 73   Temp (!) 97.5 F (36.4 C) (Tympanic)   Resp 16   Ht '5\' 5"'$  (1.651 m)   Wt 142 lb 1.6 oz (64.5 kg)   BMI 23.65 kg/m   Filed Weights   06/04/22 1000  Weight: 142 lb 1.6 oz (64.5 kg)     Patient appears weak.   Physical Exam HENT:     Head: Normocephalic and atraumatic.     Mouth/Throat:     Pharynx: No oropharyngeal exudate.  Eyes:     Pupils: Pupils are equal, round, and reactive to light.  Cardiovascular:     Rate and Rhythm: Normal rate and regular rhythm.  Pulmonary:     Effort: No respiratory distress.     Breath sounds: No wheezing.  Abdominal:     General: Bowel sounds are normal. There is no distension.     Palpations: Abdomen is soft. There is no mass.     Tenderness: There is no abdominal tenderness. There is no guarding or rebound.  Musculoskeletal:        General: No tenderness. Normal range of motion.     Cervical back: Normal range of motion and neck supple.  Skin:    General: Skin is warm.  Neurological:     Mental Status: She is alert and oriented to person, place, and time.  Psychiatric:        Mood and Affect: Affect normal.     LABORATORY DATA:  I have reviewed the data as listed    Component Value Date/Time   NA 134 (L) 06/04/2022 0915   NA 142 08/16/2013 1028   K 3.5 06/04/2022 0915   K 4.5 08/16/2013 1028   CL 95 (L) 06/04/2022 0915   CL 106 08/16/2013 1028   CO2 25 06/04/2022 0915   CO2 27 08/16/2013 1028   GLUCOSE 108 (H) 06/04/2022 0915   GLUCOSE 99 08/16/2013 1028   BUN 31 (H) 06/04/2022 0915   BUN 21 (H) 08/16/2013 1028   CREATININE 1.45 (H) 06/04/2022 0915   CREATININE 1.42 (H) 09/07/2014 0954   CALCIUM 9.5 06/04/2022 0915   CALCIUM 9.3 09/07/2014 0954   PROT 6.8 06/04/2022 0915   PROT 7.4 08/16/2013 1028   ALBUMIN 3.3 (L) 06/04/2022 0915   ALBUMIN 3.9 08/16/2013 1028   AST 36 06/04/2022 0915   AST  27 08/16/2013 1028   ALT 13 06/04/2022 0915   ALT 15 08/16/2013 1028   ALKPHOS 75  06/04/2022 0915   ALKPHOS 61 08/16/2013 1028   BILITOT 0.6 06/04/2022 0915   BILITOT 0.3 08/16/2013 1028   GFRNONAA 36 (L) 06/04/2022 0915   GFRNONAA 36 (L) 09/07/2014 0954   GFRAA 19 (L) 02/09/2020 1001   GFRAA 42 (L) 09/07/2014 0954    No results found for: "SPEP", "UPEP"  Lab Results  Component Value Date   WBC 8.5 06/04/2022   NEUTROABS 6.7 06/04/2022   HGB 9.2 (L) 06/04/2022   HCT 31.1 (L) 06/04/2022   MCV 109.1 (H) 06/04/2022   PLT 337 06/04/2022      Chemistry      Component Value Date/Time   NA 134 (L) 06/04/2022 0915   NA 142 08/16/2013 1028   K 3.5 06/04/2022 0915   K 4.5 08/16/2013 1028   CL 95 (L) 06/04/2022 0915   CL 106 08/16/2013 1028   CO2 25 06/04/2022 0915   CO2 27 08/16/2013 1028   BUN 31 (H) 06/04/2022 0915   BUN 21 (H) 08/16/2013 1028   CREATININE 1.45 (H) 06/04/2022 0915   CREATININE 1.42 (H) 09/07/2014 0954      Component Value Date/Time   CALCIUM 9.5 06/04/2022 0915   CALCIUM 9.3 09/07/2014 0954   ALKPHOS 75 06/04/2022 0915   ALKPHOS 61 08/16/2013 1028   AST 36 06/04/2022 0915   AST 27 08/16/2013 1028   ALT 13 06/04/2022 0915   ALT 15 08/16/2013 1028   BILITOT 0.6 06/04/2022 0915   BILITOT 0.3 08/16/2013 1028       RADIOGRAPHIC STUDIES: I have personally reviewed the radiological images as listed and agreed with the findings in the report. No results found.   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  Kappa lambda light chain ratio-rising; Lamda light chain- 329. NOV 2023-skeletal survey no lytic lesions.  Worsening renal insufficiency; GFR 20s; and also worsening anemia hemoglobin 9.8; iron saturation 20%.  Currently on daratumumab subcu with plan to add revlimid  with cycle #2.   # Continue to HOLD DARA cycle  #1-day 8 today. Labs today reviewed;  given acute issues below [recent COVID/pneumonia/declining performance status/vertebral compression fractures].JAN 2024- M proetin 0.3; Kappa=71 [OCT 2023- BL-0.7;  BL- 329]; reviewed the myeloma panel is better just as 1 treatment.  Recommend holding any therapy at this time.   # Discussed the role of Zometa to decrease skeletal related events [pain; fractures; need for radiation; hypercalcemia].  Discussed the potential side effects including but not limited to-infusion reaction; hypocalcemia; and Osteo-necrosis of jaw. Awaiting dental evaluation next week.   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  New refill sent.  # Recent COVID infection/secondary pneumonia -immunocompromised status/second of multiple myeloma/dara SQ injections- HOLD dara/Revlimid script at this time.  Discussed with pharmacy.  # Compression fractures- on brace [Dr.Jenkins; [place]] rib fractures- on hydrocodone 1 pill q 8 hrs prn; Tyelenol prn; Tinazidine BID. Consider adding zometa at next visit.  Awaiting vitamin D levels.  Continue calcium plus vitamin D.  # Malnutrition-weight loss multifactorial-recommend increasing protein intake.  S/p evaluation  with nutrition. Add zyprexa 5 mg/day.   # Dysphagia/weight loss- -recommend evaluation with GI - ordered barium esophagogram.  Add zyprexa 5 mg/day.   # CKD stage III- IVcreatinine-creatinine GFR-32-[Dr.Singh] stable; recommend adding vit D  # RA- [on Plaquenil]- stable  # Anxiety/nausea- prescribed/refilled  xanax 0.25 mg qhs [further refills to PCP]-continue Zofran as needed. Add zyprexa 5 mg/day.   # Vaccination:  flu shot s/p;  Covid booster; RSV- discussed   #DISPOSITION:  # barium esophagogram ASAP  # follow up in 2  week-  MD;  labs-cbc/cmp; possible Zometa-  Dr.B  # 40 minutes  face-to-face with the patient discussing the above plan of care; more than 50% of time spent on prognosis/ natural history; counseling and coordination.   Cc; Dr.Hande/ Dr.Singh      Cammie Sickle, MD 06/04/2022 12:53 PM

## 2022-06-04 NOTE — Patient Instructions (Signed)
#  Please get dental clearance re: Zometa

## 2022-06-05 ENCOUNTER — Ambulatory Visit: Payer: BLUE CROSS/BLUE SHIELD

## 2022-06-05 ENCOUNTER — Other Ambulatory Visit: Payer: Self-pay

## 2022-06-05 LAB — MISC LABCORP TEST (SEND OUT): Labcorp test code: 81950

## 2022-06-07 ENCOUNTER — Other Ambulatory Visit: Payer: Self-pay

## 2022-06-10 ENCOUNTER — Other Ambulatory Visit: Payer: Self-pay

## 2022-06-12 ENCOUNTER — Ambulatory Visit
Admission: RE | Admit: 2022-06-12 | Discharge: 2022-06-12 | Disposition: A | Discharge status: Home / Self Care | Payer: Medicare Other | Source: Ambulatory Visit | Attending: Internal Medicine | Admitting: Internal Medicine

## 2022-06-12 ENCOUNTER — Other Ambulatory Visit: Payer: Self-pay | Admitting: *Deleted

## 2022-06-12 ENCOUNTER — Telehealth: Payer: Self-pay | Admitting: Internal Medicine

## 2022-06-12 DIAGNOSIS — R1319 Other dysphagia: Secondary | ICD-10-CM | POA: Diagnosis present

## 2022-06-12 DIAGNOSIS — C9000 Multiple myeloma not having achieved remission: Secondary | ICD-10-CM | POA: Insufficient documentation

## 2022-06-12 MED ORDER — LANSOPRAZOLE 30 MG PO CPDR: 30.0000 mg | DELAYED_RELEASE_CAPSULE | Freq: Two times a day (BID) | ORAL | 2 refills | Status: DC

## 2022-06-12 NOTE — Telephone Encounter (Signed)
I called to discuss  re: esophagus dysmotility- on reflux vs others.  But no obvious evidence of obstruction noted.  However, recommend [facility] evaluation for further recommendations.    However phone call disconnected because of poor connection.  H/M- Please confirm patient is taking her lansoprazole/reflux medication twice a day.  And let me know I will be to make a referral to [facility].   Thanks GB

## 2022-06-12 NOTE — Telephone Encounter (Signed)
Instructed pt to take 30 mg lansoprazole twice daily. Sent new Rx into her pharmacy. Will make referral to [facility].

## 2022-06-15 ENCOUNTER — Telehealth: Payer: Self-pay | Admitting: *Deleted

## 2022-06-15 ENCOUNTER — Other Ambulatory Visit: Payer: Self-pay | Admitting: *Deleted

## 2022-06-15 DIAGNOSIS — R1319 Other dysphagia: Secondary | ICD-10-CM

## 2022-06-15 NOTE — Telephone Encounter (Signed)
GI referral entered in epic. Records faxed to [facility]. Dx dyphagia.

## 2022-06-15 NOTE — Telephone Encounter (Signed)
GI referral sent. Records faxed to [facility]

## 2022-06-17 ENCOUNTER — Other Ambulatory Visit: Payer: Self-pay | Admitting: *Deleted

## 2022-06-17 DIAGNOSIS — C9000 Multiple myeloma not having achieved remission: Secondary | ICD-10-CM

## 2022-06-18 ENCOUNTER — Inpatient Hospital Stay: Payer: Medicare Other | Attending: Internal Medicine

## 2022-06-18 DIAGNOSIS — Z7901 Long term (current) use of anticoagulants: Secondary | ICD-10-CM | POA: Insufficient documentation

## 2022-06-18 DIAGNOSIS — R110 Nausea: Secondary | ICD-10-CM | POA: Insufficient documentation

## 2022-06-18 DIAGNOSIS — R1310 Dysphagia, unspecified: Secondary | ICD-10-CM | POA: Insufficient documentation

## 2022-06-18 DIAGNOSIS — D649 Anemia, unspecified: Secondary | ICD-10-CM | POA: Insufficient documentation

## 2022-06-18 DIAGNOSIS — M069 Rheumatoid arthritis, unspecified: Secondary | ICD-10-CM | POA: Insufficient documentation

## 2022-06-18 DIAGNOSIS — R0602 Shortness of breath: Secondary | ICD-10-CM | POA: Insufficient documentation

## 2022-06-18 DIAGNOSIS — Z79899 Other long term (current) drug therapy: Secondary | ICD-10-CM | POA: Insufficient documentation

## 2022-06-18 DIAGNOSIS — K5900 Constipation, unspecified: Secondary | ICD-10-CM | POA: Insufficient documentation

## 2022-06-18 DIAGNOSIS — C9002 Multiple myeloma in relapse: Secondary | ICD-10-CM | POA: Insufficient documentation

## 2022-06-18 DIAGNOSIS — F419 Anxiety disorder, unspecified: Secondary | ICD-10-CM | POA: Insufficient documentation

## 2022-06-18 DIAGNOSIS — M2550 Pain in unspecified joint: Secondary | ICD-10-CM | POA: Insufficient documentation

## 2022-06-18 DIAGNOSIS — N183 Chronic kidney disease, stage 3 unspecified: Secondary | ICD-10-CM | POA: Insufficient documentation

## 2022-06-18 DIAGNOSIS — Z9481 Bone marrow transplant status: Secondary | ICD-10-CM | POA: Insufficient documentation

## 2022-06-18 DIAGNOSIS — M549 Dorsalgia, unspecified: Secondary | ICD-10-CM | POA: Insufficient documentation

## 2022-06-18 DIAGNOSIS — E46 Unspecified protein-calorie malnutrition: Secondary | ICD-10-CM | POA: Insufficient documentation

## 2022-06-18 DIAGNOSIS — M791 Myalgia, unspecified site: Secondary | ICD-10-CM | POA: Insufficient documentation

## 2022-06-18 DIAGNOSIS — R5383 Other fatigue: Secondary | ICD-10-CM | POA: Insufficient documentation

## 2022-06-18 DIAGNOSIS — Z9484 Stem cells transplant status: Secondary | ICD-10-CM | POA: Insufficient documentation

## 2022-06-18 NOTE — Progress Notes (Signed)
Nutrition  Called patient for scheduled telephone nutrition follow-up appointment. No answer. Left message with call back number.  Kree Armato B. Zenia Resides, Katie, [place] Registered Dietitian [phone]

## 2022-06-19 ENCOUNTER — Inpatient Hospital Stay: Payer: Medicare Other

## 2022-06-19 ENCOUNTER — Encounter: Payer: Self-pay | Admitting: Internal Medicine

## 2022-06-19 ENCOUNTER — Inpatient Hospital Stay (HOSPITAL_BASED_OUTPATIENT_CLINIC_OR_DEPARTMENT_OTHER): Payer: Medicare Other | Admitting: Internal Medicine

## 2022-06-19 VITALS — BP 140/58 | HR 76 | Temp 98.4°F | Resp 18 | Wt 148.0 lb

## 2022-06-19 DIAGNOSIS — R5383 Other fatigue: Secondary | ICD-10-CM | POA: Diagnosis not present

## 2022-06-19 DIAGNOSIS — Z79899 Other long term (current) drug therapy: Secondary | ICD-10-CM | POA: Diagnosis not present

## 2022-06-19 DIAGNOSIS — Z9484 Stem cells transplant status: Secondary | ICD-10-CM | POA: Diagnosis not present

## 2022-06-19 DIAGNOSIS — M791 Myalgia, unspecified site: Secondary | ICD-10-CM | POA: Diagnosis not present

## 2022-06-19 DIAGNOSIS — R1310 Dysphagia, unspecified: Secondary | ICD-10-CM | POA: Diagnosis not present

## 2022-06-19 DIAGNOSIS — K5900 Constipation, unspecified: Secondary | ICD-10-CM | POA: Diagnosis not present

## 2022-06-19 DIAGNOSIS — E46 Unspecified protein-calorie malnutrition: Secondary | ICD-10-CM | POA: Diagnosis not present

## 2022-06-19 DIAGNOSIS — C9000 Multiple myeloma not having achieved remission: Secondary | ICD-10-CM | POA: Diagnosis not present

## 2022-06-19 DIAGNOSIS — M2550 Pain in unspecified joint: Secondary | ICD-10-CM | POA: Diagnosis not present

## 2022-06-19 DIAGNOSIS — N183 Chronic kidney disease, stage 3 unspecified: Secondary | ICD-10-CM | POA: Diagnosis not present

## 2022-06-19 DIAGNOSIS — D649 Anemia, unspecified: Secondary | ICD-10-CM | POA: Diagnosis not present

## 2022-06-19 DIAGNOSIS — Z9481 Bone marrow transplant status: Secondary | ICD-10-CM | POA: Diagnosis not present

## 2022-06-19 DIAGNOSIS — R110 Nausea: Secondary | ICD-10-CM | POA: Diagnosis not present

## 2022-06-19 DIAGNOSIS — F419 Anxiety disorder, unspecified: Secondary | ICD-10-CM | POA: Diagnosis not present

## 2022-06-19 DIAGNOSIS — M069 Rheumatoid arthritis, unspecified: Secondary | ICD-10-CM | POA: Diagnosis not present

## 2022-06-19 DIAGNOSIS — Z7901 Long term (current) use of anticoagulants: Secondary | ICD-10-CM | POA: Diagnosis not present

## 2022-06-19 DIAGNOSIS — M549 Dorsalgia, unspecified: Secondary | ICD-10-CM | POA: Diagnosis not present

## 2022-06-19 DIAGNOSIS — R0602 Shortness of breath: Secondary | ICD-10-CM | POA: Diagnosis not present

## 2022-06-19 DIAGNOSIS — Z5111 Encounter for antineoplastic chemotherapy: Secondary | ICD-10-CM | POA: Diagnosis present

## 2022-06-19 DIAGNOSIS — C9002 Multiple myeloma in relapse: Secondary | ICD-10-CM | POA: Diagnosis present

## 2022-06-19 LAB — CBC WITH DIFFERENTIAL/PLATELET
Abs Immature Granulocytes: 0.1 10*3/uL — ABNORMAL HIGH (ref 0.00–0.07)
Basophils Absolute: 0.1 10*3/uL (ref 0.0–0.1)
Basophils Relative: 1 %
Eosinophils Absolute: 0 10*3/uL (ref 0.0–0.5)
Eosinophils Relative: 0 %
HCT: 28.9 % — ABNORMAL LOW (ref 36.0–46.0)
Hemoglobin: 9 g/dL — ABNORMAL LOW (ref 12.0–15.0)
Immature Granulocytes: 1 %
Lymphocytes Relative: 16 %
Lymphs Abs: 1.5 10*3/uL (ref 0.7–4.0)
MCH: 32.6 pg (ref 26.0–34.0)
MCHC: 31.1 g/dL (ref 30.0–36.0)
MCV: 104.7 fL — ABNORMAL HIGH (ref 80.0–100.0)
Monocytes Absolute: 0.7 10*3/uL (ref 0.1–1.0)
Monocytes Relative: 8 %
Neutro Abs: 7 10*3/uL (ref 1.7–7.7)
Neutrophils Relative %: 74 %
Platelets: 241 10*3/uL (ref 150–400)
RBC: 2.76 MIL/uL — ABNORMAL LOW (ref 3.87–5.11)
RDW: 15.1 % (ref 11.5–15.5)
WBC: 9.5 10*3/uL (ref 4.0–10.5)
nRBC: 0 % (ref 0.0–0.2)

## 2022-06-19 LAB — COMPREHENSIVE METABOLIC PANEL
ALT: 10 U/L (ref 0–44)
AST: 27 U/L (ref 15–41)
Albumin: 3.4 g/dL — ABNORMAL LOW (ref 3.5–5.0)
Alkaline Phosphatase: 102 U/L (ref 38–126)
Anion gap: 11 (ref 5–15)
BUN: 23 mg/dL (ref 8–23)
CO2: 24 mmol/L (ref 22–32)
Calcium: 9.3 mg/dL (ref 8.9–10.3)
Chloride: 97 mmol/L — ABNORMAL LOW (ref 98–111)
Creatinine, Ser: 1.53 mg/dL — ABNORMAL HIGH (ref 0.44–1.00)
GFR, Estimated: 34 mL/min — ABNORMAL LOW (ref >60)
Glucose, Bld: 112 mg/dL — ABNORMAL HIGH (ref 70–99)
Potassium: 3.9 mmol/L (ref 3.5–5.1)
Sodium: 132 mmol/L — ABNORMAL LOW (ref 135–145)
Total Bilirubin: 0.5 mg/dL (ref 0.3–1.2)
Total Protein: 6.8 g/dL (ref 6.5–8.1)

## 2022-06-19 MED ORDER — ZOLEDRONIC ACID 4 MG/5ML IV CONC
3.0000 mg | Freq: Once | INTRAVENOUS | Status: AC
  Administered 2022-06-19: 3 mg via INTRAVENOUS
  Filled 2022-06-19: qty 3.75

## 2022-06-19 MED ORDER — SODIUM CHLORIDE 0.9 % IV SOLN
Freq: Once | INTRAVENOUS | Status: AC
  Filled 2022-06-19: qty 250

## 2022-06-19 NOTE — Progress Notes (Signed)
Appt with GI on 2/29 has a narrowing in her esopahagus. Has constant back pain. Wearing a brace. No appetite. Makes herself eat. No weight loss.

## 2022-06-19 NOTE — Assessment & Plan Note (Addendum)
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  Kappa lambda light chain ratio-rising; Lamda light chain- 329. NOV 2023-skeletal survey no lytic lesions.  Worsening renal insufficiency; GFR 20s; and also worsening anemia hemoglobin 9.8; iron saturation 20%.  Currently on daratumumab subcu with plan to add revlimid with cycle #2. Dara cycle 1; day 1 on 12/07; currently on HOLD-see below  # Continue to HOLD DARA cycle  #1-day 8 today. Labs today reviewed;  given acute issues below [recent COVID/pneumonia/declining performance status/vertebral compression fractures]. JAN 2024- M proetin 0.3; Kappa=71 [OCT 2023- BL-0.7;  BL- 329]; reviewed the myeloma panel is better just as 1 treatment.  Recommend holding any therapy at this time.  Will repeat myeloma panel in 1 month.  # Continue to HOLD dara for now; Recent COVID infection/secondary pneumonia -immunocompromised status/second of multiple myeloma/dara SQ injections- HOLD dara/Revlimid script at this time.   # Discussed the role of Zometa to decrease skeletal related events [pain; fractures; need for radiation; hypercalcemia].  Discussed the potential side effects including but not limited to-infusion reaction; hypocalcemia; and Osteo-necrosis of jaw. S/p dental evaluation- clearance with Zometa.  Calcium 9.4.  Proceed with Zometa.  Discussed regarding infusion reaction from Zometa.  Recommend Tylenol/Benadryl as needed.   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  Given the need for upcoming surgery-recommend Lovenox bridge if decided to proceed with kyphoplasty prior to end of March.  # Compression fractures- on brace [Dr.Jenkins; [place]] rib fractures- on hydrocodone 1 pill q 8 hrs  prn; Tyelenol prn; Tinazidine BID.  Proceed with Zometa today.  Patient considering kyphoplasty/awaiting MRI spine.  As discussed above would bridge with Lovenox prior to surgery as needed.  Continue calcium plus vitamin D.  # Malnutrition-weight loss multifactorial- continue increasing protein intake.  S/p evaluation with nutrition. Continue of  zyprexa 5 mg/day.   # Dysphagia/weight loss- -awaiting evaluation with GI on 2/29. barium esophagogram- ? reflux.  On zyprexa 5 mg/day.   # CKD stage III- IVcreatinine-creatinine GFR-32-[Dr.Singh] stable;  continue vit D  # RA- [on Plaquenil]- stable  # Anxiety/nausea- prescribed/refilled  xanax 0.25 mg qhs [further refills to PCP]-continue Zofran as needed. Continue zyprexa 5 mg/day.   # Vaccination:  flu shot s/p;  Covid booster; RSV- discussed   #DISPOSITION:  # Zometa # follow up in 4  week-  MD;  labs-cbc/cmp;MM panel; K/l light chains; iron studies;ferritin; zometa-- Dr.B  Cc; Dr.Hande/ Dr.Singh

## 2022-06-19 NOTE — Progress Notes (Signed)
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  No evidence of organ dysfunction; continue close surveillance.  # NOV 2023- NOV 2023- REPEAT BONE MARROW-plasma cells cell 10-20%.  Kappa lambda light chain ratio-rising; Lamda light chain- 329. NOV 2023-skeletal survey no lytic lesions.  Worsening renal insufficiency; GFR 20s; and also worsening anemia hemoglobin 9.8; iron saturation 20%.   # DEC 7th, 2023- DARA SQ+ Dex 20 mg; REV- hold cycle #1.   # CKD [~ creat 1.35- 1.7] ; Dr.Singh  # 2012- LEFT KIDNEY ? 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 benign   Multiple myeloma not having achieved remission ([place])  12/18/2015 Initial Diagnosis   Multiple myeloma in remission ([place])   04/16/2022 -  Chemotherapy   Patient is on Treatment Plan : MYELOMA RELAPSED REFRACTORY Daratumumab SQ + Lenalidomide + Dexamethasone (DaraRd) q28d      INTERVAL HISTORY: Ambulating in a wheel chair.  Accompanied by her granddaughter.   This is a pleasant 82  year-old female patient with above history of relapsed multiple Myeloma [DEC 2023]-currently on daratumumab SQ treatments is here for follow-up. Has not started Revlimid and has not received yet.  Patient received daratumumab cycle number 1 day about  on 12/07.  However, therapy currently on hold because of admission for  COVID-pneumonia/deconditioning; vertebral compression fractures.   In the interim evaluated by Neurosurgery [Dr.jenkins] for kyphoplasty/compression fractures. Currently has constant back pain. Wearing a brace. Taking hydrocodone 5/325 mg BID; with muscle relaxant.  No appetite. Makes herself eat. No weight loss  Drinking 1-2 boosts a day. Status post evaluation with nutrition. barium esophagogram- Appt with GI on 2/29 has a narrowing in her esopahagus. On PPI BID.  Complains of constipation- better.  As per the granddaughter patient seems to be improving although slightly.  Review of Systems  Constitutional:  Positive for malaise/fatigue. Negative for diaphoresis and weight loss.  HENT:  Negative for nosebleeds and sore throat.   Eyes:  Negative for double vision.  Respiratory:  Positive for shortness of breath. Negative for hemoptysis and wheezing.   Cardiovascular:  Negative for chest pain, palpitations, orthopnea and leg swelling.  Gastrointestinal:  Positive for nausea. Negative for abdominal pain, blood in stool, constipation, diarrhea, heartburn, melena and vomiting.  Genitourinary:  Negative for dysuria, frequency and urgency.  Musculoskeletal:  Positive for back pain, joint pain and myalgias.  Skin: Negative.  Negative for itching and rash.  Neurological:  Positive for tingling. Negative for dizziness, focal weakness, weakness and headaches.  Endo/Heme/Allergies:  Does not bruise/bleed easily.  Psychiatric/Behavioral:  Negative for depression. The patient is not nervous/anxious and does not have insomnia.     ALLERGIES:  is allergic to isoniazid.  MEDICATIONS:  Current Outpatient Medications  Medication Sig Dispense Refill   acetaminophen (TYLENOL) 325 MG tablet Take 650 mg by mouth every 6 (six) hours as needed for fever or headache.     acyclovir (ZOVIRAX) 400  MG tablet Take 1 tablet (400 mg total) by mouth 2 (two) times daily. 60 tablet 4   allopurinol (ZYLOPRIM) 300 MG  tablet Take 450 mg by mouth daily.     ALPRAZolam (XANAX) 0.25 MG tablet TAKE 1 TABLET(0.25 MG) BY MOUTH AT BEDTIME AS NEEDED FOR ANXIETY (Patient taking differently: Take 0.25 mg by mouth at bedtime as needed for anxiety or sleep.) 30 tablet 1   apixaban (ELIQUIS) 5 MG TABS tablet Take 1 tablet (5 mg total) by mouth 2 (two) times daily. 60 tablet 1   Ascorbic Acid (VITAMIN C) 1000 MG tablet Take 1,000 mg by mouth daily.     atenolol (TENORMIN) 50 MG tablet Take 0.5 tablets (25 mg total) by mouth daily. 30 tablet 0   CALCIUM MAGNESIUM ZINC PO Take 1 tablet by mouth in the morning, at noon, and at bedtime.     cetirizine (ZYRTEC) 10 MG tablet Take 10 mg by mouth 2 (two) times daily.      docusate sodium (COLACE) 100 MG capsule Take 100 mg by mouth 2 (two) times daily.     DULoxetine (CYMBALTA) 20 MG capsule Take 20 mg by mouth 2 (two) times daily.     furosemide (LASIX) 40 MG tablet Take 1 tablet (40 mg total) by mouth daily. 30 tablet 1   HYDROcodone-acetaminophen (NORCO/VICODIN) 5-325 MG tablet Take 1 tablet by mouth every 8 (eight) hours as needed for moderate pain. 60 tablet 0   hydroxychloroquine (PLAQUENIL) 200 MG tablet Take 200 mg by mouth 2 (two) times daily.     lansoprazole (PREVACID) 15 MG capsule Take 30 mg by mouth 2 (two) times daily before a meal.     lansoprazole (PREVACID) 30 MG capsule Take 1 capsule (30 mg total) by mouth 2 (two) times daily before a meal. 60 capsule 2   levothyroxine (SYNTHROID) 112 MCG tablet Take 112 mcg by mouth daily before breakfast.     MYRBETRIQ 50 MG TB24 tablet Take 50 mg by mouth daily.     oxybutynin (DITROPAN-XL) 10 MG 24 hr tablet Take 1 tablet (10 mg total) by mouth daily. 30 tablet 11   Potassium 99 MG TABS Take 1 tablet by mouth daily.     rOPINIRole (REQUIP) 0.5 MG tablet Take 0.75 mg by mouth at bedtime as needed (Restless leg).     Spacer/Aero-Holding Chambers DEVI 1 each by Does not apply route as needed. 1 each 0   tiZANidine (ZANAFLEX) 2  MG tablet Take 2 mg by mouth at bedtime.     vitamin B-12 (CYANOCOBALAMIN) 1000 MCG tablet Take 1,000 mcg by mouth daily.     zinc gluconate 50 MG tablet Take 50 mg by mouth daily.     albuterol (VENTOLIN HFA) 108 (90 Base) MCG/ACT inhaler Inhale 2 puffs into the lungs every 4 (four) hours as needed for wheezing or shortness of breath. (Patient not taking: Reported on 06/19/2022) 8 g 0   benzonatate (TESSALON) 200 MG capsule Take 1 capsule (200 mg total) by mouth 3 (three) times daily as needed for cough. (Patient not taking: Reported on 05/21/2022) 30 capsule 0   chlorpheniramine-HYDROcodone (TUSSIONEX) 10-8 MG/5ML Take 5 mLs by mouth every 12 (twelve) hours as needed (for cough unrelieved by other medications). May cause drowsiness. (Patient not taking: Reported on 06/19/2022) 140 mL 0   fluticasone (FLONASE) 50 MCG/ACT nasal spray Place 2 sprays into the nose daily.  (Patient not taking: Reported on 06/19/2022)     guaiFENesin ([place]) 600 MG  12 hr tablet Take 1 tablet (600 mg total) by mouth 2 (two) times daily. (Patient not taking: Reported on 05/21/2022) 14 tablet 0   ipratropium (ATROVENT HFA) 17 MCG/ACT inhaler Inhale 2 puffs into the lungs every 4 (four) hours as needed for wheezing. (Patient not taking: Reported on 06/19/2022)     Ipratropium-Albuterol (COMBIVENT) 20-100 MCG/ACT AERS respimat Inhale 1 puff into the lungs every 6 (six) hours as needed for wheezing. (Patient not taking: Reported on 06/19/2022) 4 g 0   lenalidomide (REVLIMID) 10 MG capsule Take 1 capsule (10 mg total) by mouth daily. Take for 21 days, then hold for 7 days. Repeat every 28 days. (Patient not taking: Reported on 05/21/2022) 21 capsule 0   OLANZapine (ZYPREXA) 5 MG tablet Take 1 tablet (5 mg total) by mouth at bedtime. (Patient not taking: Reported on 06/19/2022) 30 tablet 1   ondansetron (ZOFRAN) 8 MG tablet Take 4-8 mg by mouth every 8 (eight) hours as needed for nausea or vomiting. (Patient not taking: Reported on 06/19/2022)      No current facility-administered medications for this visit.   Facility-Administered Medications Ordered in Other Visits  Medication Dose Route Frequency Provider Last Rate Last Admin   zoledronic acid (ZOMETA) 3 mg in sodium chloride 0.9 % 100 mL IVPB  3 mg Intravenous Once Cammie Sickle, MD 415 mL/hr at 06/19/22 1035 3 mg at 06/19/22 1035    PHYSICAL EXAMINATION: ECOG PERFORMANCE STATUS: 0 - Asymptomatic  BP (!) 140/58   Pulse 76   Temp 98.4 F (36.9 C) (Tympanic)   Resp 18   Wt 148 lb (67.1 kg)   SpO2 99%   BMI 24.63 kg/m   Filed Weights   06/19/22 0903  Weight: 148 lb (67.1 kg)     Patient appears weak.   Physical Exam HENT:     Head: Normocephalic and atraumatic.     Mouth/Throat:     Pharynx: No oropharyngeal exudate.  Eyes:     Pupils: Pupils are equal, round, and reactive to light.  Cardiovascular:     Rate and Rhythm: Normal rate and regular rhythm.  Pulmonary:     Effort: No respiratory distress.     Breath sounds: No wheezing.  Abdominal:     General: Bowel sounds are normal. There is no distension.     Palpations: Abdomen is soft. There is no mass.     Tenderness: There is no abdominal tenderness. There is no guarding or rebound.  Musculoskeletal:        General: No tenderness. Normal range of motion.     Cervical back: Normal range of motion and neck supple.  Skin:    General: Skin is warm.  Neurological:     Mental Status: She is alert and oriented to person, place, and time.  Psychiatric:        Mood and Affect: Affect normal.     LABORATORY DATA:  I have reviewed the data as listed    Component Value Date/Time   NA 132 (L) 06/19/2022 0839   NA 142 08/16/2013 1028   K 3.9 06/19/2022 0839   K 4.5 08/16/2013 1028   CL 97 (L) 06/19/2022 0839   CL 106 08/16/2013 1028   CO2 24 06/19/2022 0839   CO2 27 08/16/2013 1028   GLUCOSE 112 (H) 06/19/2022 0839   GLUCOSE 99 08/16/2013 1028   BUN 23 06/19/2022 0839   BUN 21 (H)  08/16/2013 1028   CREATININE 1.53 (H) 06/19/2022 [id number]  CREATININE 1.42 (H) 09/07/2014 0954   CALCIUM 9.3 06/19/2022 0839   CALCIUM 9.3 09/07/2014 0954   PROT 6.8 06/19/2022 0839   PROT 7.4 08/16/2013 1028   ALBUMIN 3.4 (L) 06/19/2022 0839   ALBUMIN 3.9 08/16/2013 1028   AST 27 06/19/2022 0839   AST 27 08/16/2013 1028   ALT 10 06/19/2022 0839   ALT 15 08/16/2013 1028   ALKPHOS 102 06/19/2022 0839   ALKPHOS 61 08/16/2013 1028   BILITOT 0.5 06/19/2022 0839   BILITOT 0.3 08/16/2013 1028   GFRNONAA 34 (L) 06/19/2022 0839   GFRNONAA 36 (L) 09/07/2014 0954   GFRAA 19 (L) 02/09/2020 1001   GFRAA 42 (L) 09/07/2014 0954    No results found for: "SPEP", "UPEP"  Lab Results  Component Value Date   WBC 9.5 06/19/2022   NEUTROABS 7.0 06/19/2022   HGB 9.0 (L) 06/19/2022   HCT 28.9 (L) 06/19/2022   MCV 104.7 (H) 06/19/2022   PLT 241 06/19/2022      Chemistry      Component Value Date/Time   NA 132 (L) 06/19/2022 0839   NA 142 08/16/2013 1028   K 3.9 06/19/2022 0839   K 4.5 08/16/2013 1028   CL 97 (L) 06/19/2022 0839   CL 106 08/16/2013 1028   CO2 24 06/19/2022 0839   CO2 27 08/16/2013 1028   BUN 23 06/19/2022 0839   BUN 21 (H) 08/16/2013 1028   CREATININE 1.53 (H) 06/19/2022 0839   CREATININE 1.42 (H) 09/07/2014 0954      Component Value Date/Time   CALCIUM 9.3 06/19/2022 0839   CALCIUM 9.3 09/07/2014 0954   ALKPHOS 102 06/19/2022 0839   ALKPHOS 61 08/16/2013 1028   AST 27 06/19/2022 0839   AST 27 08/16/2013 1028   ALT 10 06/19/2022 0839   ALT 15 08/16/2013 1028   BILITOT 0.5 06/19/2022 0839   BILITOT 0.3 08/16/2013 1028       RADIOGRAPHIC STUDIES: I have personally reviewed the radiological images as listed and agreed with the findings in the report. No results found.   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  Kappa lambda light chain ratio-rising; Lamda light chain- 329. NOV 2023-skeletal survey no lytic lesions.  Worsening renal insufficiency; GFR 20s; and also worsening anemia hemoglobin 9.8; iron saturation 20%.  Currently on daratumumab subcu with plan to add revlimid with cycle #2. Dara cycle 1; day 1 on 12/07; currently on HOLD-see below  # Continue to HOLD DARA cycle  #1-day 8 today. Labs today reviewed;  given acute issues below [recent COVID/pneumonia/declining performance status/vertebral compression fractures]. JAN 2024- M proetin 0.3; Kappa=71 [OCT 2023- BL-0.7;  BL- 329]; reviewed the myeloma panel is better just as 1 treatment.  Recommend holding any therapy at this time.  Will repeat myeloma panel in 1 month.  # Continue to HOLD dara for now; Recent COVID infection/secondary pneumonia -immunocompromised status/second of multiple myeloma/dara SQ injections- HOLD dara/Revlimid script at this time.   # Discussed the role of Zometa to decrease skeletal related events [pain; fractures; need for radiation; hypercalcemia].  Discussed the potential side effects including but not limited to-infusion reaction; hypocalcemia; and Osteo-necrosis of jaw. S/p dental evaluation- clearance with Zometa.  Calcium 9.4.  Proceed with Zometa.  Discussed  regarding infusion reaction from Zometa.  Recommend Tylenol/Benadryl as needed.   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  Given the need for upcoming surgery-recommend Lovenox bridge if decided to proceed with kyphoplasty prior to end of March.  # Compression fractures- on brace [Dr.Jenkins; [place]] rib fractures- on hydrocodone 1 pill q 8 hrs prn; Tyelenol prn; Tinazidine BID.  Proceed with Zometa today.  Patient considering kyphoplasty/awaiting MRI spine.   As discussed above would bridge with Lovenox prior to surgery as needed.  Continue calcium plus vitamin D.  # Malnutrition-weight loss multifactorial- continue increasing protein intake.  S/p evaluation with nutrition. Continue of  zyprexa 5 mg/day.   # Dysphagia/weight loss- -awaiting evaluation with GI on 2/29. barium esophagogram- ? reflux.  On zyprexa 5 mg/day.   # CKD stage III- IVcreatinine-creatinine GFR-32-[Dr.Singh] stable;  continue vit D  # RA- [on Plaquenil]- stable  # Anxiety/nausea- prescribed/refilled  xanax 0.25 mg qhs [further refills to PCP]-continue Zofran as needed. Continue zyprexa 5 mg/day.   # Vaccination:  flu shot s/p;  Covid booster; RSV- discussed   #DISPOSITION:  # Zometa # follow up in 4  week-  MD;  labs-cbc/cmp;MM panel; K/l light chains; iron studies;ferritin; zometa-- Dr.B  Cc; Dr.Hande/ Dr.Singh      Cammie Sickle, MD 06/19/2022 10:41 AM

## 2022-06-19 NOTE — Patient Instructions (Signed)

## 2022-06-19 NOTE — Progress Notes (Signed)
Nutrition Follow-up:  Patient with multiple myeloma.  Patient receiving darzalex faspro.    Met with patient and grand-daughter during infusion.  Reports that she is planning to see GI on 2/29 for narrowing of esophagus.  Has been drinking protein shakes. Yesterday ate cheese quesadilla, french onion soup.  Has issues with constipation which grand-daughter feels like effects intake.  Also has back pain.     Medications: zyprexa  Labs: Na 132, glucose 112, creatinine 1.53  Anthropometrics:   Weight 148 lb 2/9 142 lb 1.6 oz on 1/25 153 lb on 12/27 157 lb on 12/14   NUTRITION DIAGNOSIS: Inadequate oral intake slight improvement    INTERVENTION:  Continue high calorie, high protein foods Continue oral nutrition supplements for added calories   MONITORING, EVALUATION, GOAL: weight trends, intake   NEXT VISIT: as needed  Marycruz Boehner B. Zenia Resides, [place], [place] Registered Dietitian [phone]

## 2022-06-20 ENCOUNTER — Other Ambulatory Visit: Payer: Self-pay

## 2022-06-22 ENCOUNTER — Telehealth: Payer: Self-pay

## 2022-06-22 NOTE — Telephone Encounter (Signed)
Encounter entered in error.  Laray Anger, PharmD PGY-2 Pharmacy Resident Hematology/Oncology [phone]  06/22/2022 3:54 PM

## 2022-06-23 ENCOUNTER — Other Ambulatory Visit: Payer: Self-pay | Admitting: Neurosurgery

## 2022-06-23 ENCOUNTER — Other Ambulatory Visit: Payer: Self-pay | Admitting: Internal Medicine

## 2022-06-23 DIAGNOSIS — Z1231 Encounter for screening mammogram for malignant neoplasm of breast: Secondary | ICD-10-CM

## 2022-06-23 DIAGNOSIS — S22070G Wedge compression fracture of T9-T10 vertebra, subsequent encounter for fracture with delayed healing: Secondary | ICD-10-CM

## 2022-06-30 ENCOUNTER — Other Ambulatory Visit: Payer: Self-pay

## 2022-07-01 ENCOUNTER — Ambulatory Visit
Admission: RE | Admit: 2022-07-01 | Discharge: 2022-07-01 | Disposition: A | Discharge status: Home / Self Care | Payer: Medicare Other | Source: Ambulatory Visit | Attending: Neurosurgery | Admitting: Neurosurgery

## 2022-07-01 DIAGNOSIS — S22070G Wedge compression fracture of T9-T10 vertebra, subsequent encounter for fracture with delayed healing: Secondary | ICD-10-CM | POA: Insufficient documentation

## 2022-07-02 ENCOUNTER — Other Ambulatory Visit: Payer: Self-pay | Admitting: *Deleted

## 2022-07-02 MED ORDER — HYDROCODONE-ACETAMINOPHEN 5-325 MG PO TABS: 1.0000 | ORAL_TABLET | Freq: Three times a day (TID) | ORAL | 0 refills | Status: DC | PRN

## 2022-07-04 ENCOUNTER — Other Ambulatory Visit: Payer: Self-pay | Admitting: Internal Medicine

## 2022-07-11 ENCOUNTER — Other Ambulatory Visit: Payer: Self-pay

## 2022-07-14 ENCOUNTER — Other Ambulatory Visit: Payer: Self-pay | Admitting: Neurosurgery

## 2022-07-14 ENCOUNTER — Telehealth: Payer: Self-pay

## 2022-07-14 NOTE — Telephone Encounter (Signed)
Melissa from [facility] is calling in stating there was a Medical Clearance fax sent over last week for the patient. Patient is having a procedure done on March 8 and needs to hold Eliquis.

## 2022-07-14 NOTE — Telephone Encounter (Signed)
Callback [id number]

## 2022-07-14 NOTE — Telephone Encounter (Signed)
This fax was returned.  Not yet scanned in chart checking with medical records if they still have hard copy.

## 2022-07-15 ENCOUNTER — Other Ambulatory Visit: Payer: Self-pay | Admitting: Neurosurgery

## 2022-07-15 NOTE — Telephone Encounter (Signed)
MD signed medical clearance and was faxed on 07/14/22

## 2022-07-16 ENCOUNTER — Encounter: Payer: Self-pay | Admitting: *Deleted

## 2022-07-17 ENCOUNTER — Ambulatory Visit: Payer: BLUE CROSS/BLUE SHIELD

## 2022-07-17 ENCOUNTER — Ambulatory Visit
Admission: RE | Admit: 2022-07-17 | Discharge: 2022-07-17 | Disposition: A | Discharge status: Home / Self Care | Payer: Medicare Other | Attending: Gastroenterology | Admitting: Gastroenterology

## 2022-07-17 ENCOUNTER — Ambulatory Visit: Payer: Medicare Other | Admitting: Anesthesiology

## 2022-07-17 ENCOUNTER — Other Ambulatory Visit: Payer: BLUE CROSS/BLUE SHIELD

## 2022-07-17 ENCOUNTER — Encounter
Admission: RE | Disposition: A | Discharge status: Home / Self Care | Payer: Self-pay | Source: Home / Self Care | Attending: Gastroenterology

## 2022-07-17 ENCOUNTER — Ambulatory Visit: Payer: BLUE CROSS/BLUE SHIELD | Admitting: Internal Medicine

## 2022-07-17 ENCOUNTER — Encounter: Payer: Self-pay | Admitting: *Deleted

## 2022-07-17 DIAGNOSIS — Z86718 Personal history of other venous thrombosis and embolism: Secondary | ICD-10-CM | POA: Diagnosis not present

## 2022-07-17 DIAGNOSIS — I10 Essential (primary) hypertension: Secondary | ICD-10-CM | POA: Diagnosis not present

## 2022-07-17 DIAGNOSIS — K219 Gastro-esophageal reflux disease without esophagitis: Secondary | ICD-10-CM | POA: Diagnosis not present

## 2022-07-17 DIAGNOSIS — E039 Hypothyroidism, unspecified: Secondary | ICD-10-CM | POA: Insufficient documentation

## 2022-07-17 DIAGNOSIS — Z7901 Long term (current) use of anticoagulants: Secondary | ICD-10-CM | POA: Diagnosis not present

## 2022-07-17 DIAGNOSIS — Z09 Encounter for follow-up examination after completed treatment for conditions other than malignant neoplasm: Secondary | ICD-10-CM | POA: Insufficient documentation

## 2022-07-17 DIAGNOSIS — F418 Other specified anxiety disorders: Secondary | ICD-10-CM | POA: Diagnosis not present

## 2022-07-17 DIAGNOSIS — K224 Dyskinesia of esophagus: Secondary | ICD-10-CM | POA: Insufficient documentation

## 2022-07-17 HISTORY — PX: ESOPHAGOGASTRODUODENOSCOPY (EGD) WITH PROPOFOL: SHX5813

## 2022-07-17 SURGERY — ESOPHAGOGASTRODUODENOSCOPY (EGD) WITH PROPOFOL
Anesthesia: General

## 2022-07-17 MED ORDER — SODIUM CHLORIDE 0.9 % IV SOLN: INTRAVENOUS | Status: DC

## 2022-07-17 MED ORDER — PROPOFOL 500 MG/50ML IV EMUL
INTRAVENOUS | Status: DC | PRN
  Administered 2022-07-17: 175 ug/kg/min via INTRAVENOUS

## 2022-07-17 MED ORDER — PROPOFOL 10 MG/ML IV BOLUS
INTRAVENOUS | Status: DC | PRN
  Administered 2022-07-17: 60 mg via INTRAVENOUS

## 2022-07-17 MED ORDER — LIDOCAINE HCL (CARDIAC) PF 100 MG/5ML IV SOSY
PREFILLED_SYRINGE | INTRAVENOUS | Status: DC | PRN
  Administered 2022-07-17: 100 mg via INTRAVENOUS

## 2022-07-17 NOTE — Transfer of Care (Signed)
Immediate Anesthesia Transfer of Care Note  Patient: Sydney Gillespie  Procedure(s) Performed: ESOPHAGOGASTRODUODENOSCOPY (EGD) WITH PROPOFOL  Patient Location: Endoscopy Unit  Anesthesia Type:General  Level of Consciousness: drowsy  Airway & Oxygen Therapy: Patient Spontanous Breathing  Post-op Assessment: Report given to RN and Post -op Vital signs reviewed and stable  Post vital signs: Reviewed and stable  Last Vitals:  Vitals Value Taken Time  BP 141/67 07/17/22 1330  Temp 35.8 C 07/17/22 1330  Pulse 67 07/17/22 1330  Resp 15 07/17/22 1330  SpO2 100 % 07/17/22 1330  Vitals shown include unvalidated device data.  Last Pain:  Vitals:   07/17/22 1330  TempSrc: Temporal  PainSc: Asleep         Complications: No notable events documented.

## 2022-07-17 NOTE — H&P (Signed)
Outpatient short stay form Pre-procedure 07/17/2022  Sydney Rubenstein, MD  Primary Physician: [name], MD  Reason for visit:  Dysphagia  History of present illness:    82 y/o lady with history of MM, hypothyroidism, and DVT on eliquis last dose 3 days ago here for EGD for solid food dysphagia and abnormal barium swallow. Also endorses dysphagia to liquids. Has history of cervical neck surgery.    Current Facility-Administered Medications:    0.9 %  sodium chloride infusion, , Intravenous, Continuous, Danyla Wattley, Hilton Cork, MD, Last Rate: 20 mL/hr at 07/17/22 1257, New Bag at 07/17/22 1257  Medications Prior to Admission  Medication Sig Dispense Refill Last Dose   allopurinol (ZYLOPRIM) 300 MG tablet Take 450 mg by mouth daily.   07/17/2022 at 0630   Ascorbic Acid (VITAMIN C) 1000 MG tablet Take 1,000 mg by mouth daily.   Past Week   atenolol (TENORMIN) 50 MG tablet Take 0.5 tablets (25 mg total) by mouth daily. 30 tablet 0 07/17/2022 at 0630   CALCIUM MAGNESIUM ZINC PO Take 1 tablet by mouth in the morning, at noon, and at bedtime.   Past Week   cetirizine (ZYRTEC) 10 MG tablet Take 10 mg by mouth 2 (two) times daily.    07/17/2022 at 0630   DULoxetine (CYMBALTA) 20 MG capsule Take 20 mg by mouth 2 (two) times daily.   07/17/2022 at 0630   HYDROcodone-acetaminophen (NORCO/VICODIN) 5-325 MG tablet Take 1 tablet by mouth every 8 (eight) hours as needed for moderate pain. 60 tablet 0 07/17/2022 at 0630   levothyroxine (SYNTHROID) 112 MCG tablet Take 112 mcg by mouth daily before breakfast.   07/17/2022 at 0630   MYRBETRIQ 50 MG TB24 tablet Take 50 mg by mouth daily.   07/17/2022 at 0630   OLANZapine (ZYPREXA) 5 MG tablet TAKE 1 TABLET(5 MG) BY MOUTH AT BEDTIME 90 tablet 0 07/16/2022   tiZANidine (ZANAFLEX) 2 MG tablet Take 2 mg by mouth at bedtime.   07/16/2022   vitamin B-12 (CYANOCOBALAMIN) 1000 MCG tablet Take 1,000 mcg by mouth daily.   Past Week   zinc gluconate 50 MG tablet Take 50 mg by  mouth daily.   07/16/2022   acetaminophen (TYLENOL) 325 MG tablet Take 650 mg by mouth every 6 (six) hours as needed for fever or headache.      acyclovir (ZOVIRAX) 400 MG tablet Take 1 tablet (400 mg total) by mouth 2 (two) times daily. 60 tablet 4    albuterol (VENTOLIN HFA) 108 (90 Base) MCG/ACT inhaler Inhale 2 puffs into the lungs every 4 (four) hours as needed for wheezing or shortness of breath. (Patient not taking: Reported on 06/19/2022) 8 g 0    ALPRAZolam (XANAX) 0.25 MG tablet TAKE 1 TABLET(0.25 MG) BY MOUTH AT BEDTIME AS NEEDED FOR ANXIETY (Patient taking differently: Take 0.25 mg by mouth at bedtime as needed for anxiety or sleep.) 30 tablet 1    apixaban (ELIQUIS) 5 MG TABS tablet Take 1 tablet (5 mg total) by mouth 2 (two) times daily. 60 tablet 1 07/14/2022   benzonatate (TESSALON) 200 MG capsule Take 1 capsule (200 mg total) by mouth 3 (three) times daily as needed for cough. (Patient not taking: Reported on 05/21/2022) 30 capsule 0    chlorpheniramine-HYDROcodone (TUSSIONEX) 10-8 MG/5ML Take 5 mLs by mouth every 12 (twelve) hours as needed (for cough unrelieved by other medications). May cause drowsiness. (Patient not taking: Reported on 06/19/2022) 140 mL 0    docusate sodium (COLACE)  100 MG capsule Take 100 mg by mouth 2 (two) times daily.      fluticasone (FLONASE) 50 MCG/ACT nasal spray Place 2 sprays into the nose daily.  (Patient not taking: Reported on 06/19/2022)      furosemide (LASIX) 40 MG tablet Take 1 tablet (40 mg total) by mouth daily. 30 tablet 1    guaiFENesin (MUCINEX) 600 MG 12 hr tablet Take 1 tablet (600 mg total) by mouth 2 (two) times daily. (Patient not taking: Reported on 05/21/2022) 14 tablet 0    hydroxychloroquine (PLAQUENIL) 200 MG tablet Take 200 mg by mouth 2 (two) times daily.      ipratropium (ATROVENT HFA) 17 MCG/ACT inhaler Inhale 2 puffs into the lungs every 4 (four) hours as needed for wheezing. (Patient not taking: Reported on 06/19/2022)       Ipratropium-Albuterol (COMBIVENT) 20-100 MCG/ACT AERS respimat Inhale 1 puff into the lungs every 6 (six) hours as needed for wheezing. (Patient not taking: Reported on 06/19/2022) 4 g 0    lansoprazole (PREVACID) 15 MG capsule Take 30 mg by mouth 2 (two) times daily before a meal.      lansoprazole (PREVACID) 30 MG capsule Take 1 capsule (30 mg total) by mouth 2 (two) times daily before a meal. 60 capsule 2    lenalidomide (REVLIMID) 10 MG capsule Take 1 capsule (10 mg total) by mouth daily. Take for 21 days, then hold for 7 days. Repeat every 28 days. (Patient not taking: Reported on 05/21/2022) 21 capsule 0    ondansetron (ZOFRAN) 8 MG tablet Take 4-8 mg by mouth every 8 (eight) hours as needed for nausea or vomiting. (Patient not taking: Reported on 06/19/2022)      oxybutynin (DITROPAN-XL) 10 MG 24 hr tablet Take 1 tablet (10 mg total) by mouth daily. 30 tablet 11    Potassium 99 MG TABS Take 1 tablet by mouth daily.      rOPINIRole (REQUIP) 0.5 MG tablet Take 0.75 mg by mouth at bedtime as needed (Restless leg).      Spacer/Aero-Holding Chambers DEVI 1 each by Does not apply route as needed. 1 each 0      Allergies  Allergen Reactions   Isoniazid Other (See Comments)    Blisters      Past Medical History:  Diagnosis Date   Acute respiratory failure with hypoxia ([place]) 05/07/2022   Allergic rhinitis    Allergy    Anemia    Anemia    Barrett's esophagus    Blood dyscrasia    multiple myloma remission   Cancer ([place])    Change in bowel habits    Compression fracture 2013   Degenerative disc disease, lumbar    Dysrhythmia    palpitations   Esophageal reflux    Family history of adverse reaction to anesthesia    nausea -mom   GERD (gastroesophageal reflux disease)    Gout    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[place])    Heart palpitations    Hyperlipidemia    Hypothyroidism    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    Physical Exam  Gen: Alert, oriented. Appears stated age.  HEENT: PERRLA. Lungs: No respiratory distress CV: RRR Abd: soft, benign, no masses Ext: No edema    Planned procedures: Proceed with EGD. The patient understands the nature of the planned procedure, indications, risks, alternatives and potential complications including but not limited to bleeding, infection, perforation, damage to internal organs and possible oversedation/side effects from anesthesia. The patient agrees and gives consent to proceed.  Please refer to procedure notes for findings, recommendations and patient disposition/instructions.     Sydney Rubenstein, MD [facility]

## 2022-07-17 NOTE — Anesthesia Preprocedure Evaluation (Signed)
Anesthesia Evaluation  Patient identified by MRN, date of birth, ID band Patient awake    Reviewed: Allergy & Precautions, H&P , NPO status , Patient's Chart, lab work & pertinent test results, reviewed documented beta blocker date and time   History of Anesthesia Complications Negative for: history of anesthetic complications  Airway Mallampati: III  TM Distance: >3 FB Neck ROM: Full  Mouth opening: Limited Mouth Opening  Dental no notable dental hx. (+) Dental Advisory Given, Upper Dentures, Lower Dentures   Pulmonary neg pulmonary ROS   Pulmonary exam normal breath sounds clear to auscultation       Cardiovascular hypertension, Pt. on medications and Pt. on home beta blockers + dysrhythmias  Rhythm:Regular Rate:Normal     Neuro/Psych  PSYCHIATRIC DISORDERS Anxiety Depression     Neuromuscular disease    GI/Hepatic Neg liver ROS,GERD  Medicated and Controlled,,  Endo/Other  Hypothyroidism    Renal/GU Renal InsufficiencyRenal disease  negative genitourinary   Musculoskeletal  (+) Arthritis , Rheumatoid disorders,    Abdominal   Peds  Hematology negative hematology ROS (+) Blood dyscrasia, anemia   Anesthesia Other Findings Past Medical History: No date: Allergic rhinitis No date: Allergy No date: Anemia No date: Anemia No date: Barrett's esophagus No date: Blood dyscrasia     Comment: multiple myloma remission No date: Cancer ([place]) No date: Change in bowel habits 2013: Compression fracture No date: Degenerative disc disease, lumbar No date: Dysrhythmia     Comment: palpitations No date: Esophageal reflux No date: Family history of adverse reaction to anesthes*     Comment: nausea -mom No date: GERD (gastroesophageal reflux disease) No date: Gout No date: 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[facility]) No date: Heart palpitations No date: Hyperlipidemia No date: Hypothyroidism No date: Inflammatory polyarthropathy  (HCC) No date: Lumbago No date: Lumbar radiculitis No date: Lumbar stenosis with neurogenic claudication No date: Multiple myeloma ([place]) No date: Multiple myeloma ([place]) No date: Osteopenia No date: Osteoporosis No date: Other dysphagia No date: Palpitations No date: Positive PPD No date: Renal insufficiency No date: Rheumatoid arthritis (HCC) No date: Rheumatoid arthritis (HCC) No date: Seasonal allergies No date: Vertigo  Reproductive/Obstetrics negative OB ROS                              Anesthesia Physical Anesthesia Plan  ASA: 3  Anesthesia Plan: General   Post-op Pain Management: Minimal or no pain anticipated   Induction: Intravenous  PONV Risk Score and Plan: 3 and Propofol infusion, TIVA and Ondansetron  Airway Management Planned: Oral ETT  Additional Equipment: None  Intra-op Plan:   Post-operative Plan: Extubation in OR  Informed Consent: I have reviewed the patients History and Physical, chart, labs and discussed the procedure including the risks, benefits and alternatives for the proposed anesthesia with the patient or authorized representative who has indicated his/her understanding and acceptance.     Dental advisory given  Plan Discussed with: CRNA  Anesthesia Plan Comments: (Discussed risks of anesthesia with patient, including possibility of difficulty with spontaneous ventilation under anesthesia necessitating airway intervention, PONV, and rare risks such as cardiac or respiratory or neurological events, and allergic reactions. Discussed the role of CRNA in patient's perioperative care. Patient understands.)         Anesthesia Quick Evaluation

## 2022-07-17 NOTE — Interval H&P Note (Signed)
History and Physical Interval Note:  07/17/2022 1:15 PM  Sydney Gillespie  has presented today for surgery, with the diagnosis of abnormal barium swallow,pharyngowsophageal dysphasia.  The various methods of treatment have been discussed with the patient and family. After consideration of risks, benefits and other options for treatment, the patient has consented to  Procedure(s): ESOPHAGOGASTRODUODENOSCOPY (EGD) WITH PROPOFOL (N/A) as a surgical intervention.  The patient's history has been reviewed, patient examined, no change in status, stable for surgery.  I have reviewed the patient's chart and labs.  Questions were answered to the patient's satisfaction.     Lesly Rubenstein  Ok to proceed with EGD

## 2022-07-17 NOTE — Anesthesia Postprocedure Evaluation (Signed)
Anesthesia Post Note  Patient: Sydney Gillespie  Procedure(s) Performed: ESOPHAGOGASTRODUODENOSCOPY (EGD) WITH PROPOFOL  Patient location during evaluation: Endoscopy Anesthesia Type: General Level of consciousness: awake and alert Pain management: pain level controlled Vital Signs Assessment: post-procedure vital signs reviewed and stable Respiratory status: spontaneous breathing, nonlabored ventilation, respiratory function stable and patient connected to nasal cannula oxygen Cardiovascular status: blood pressure returned to baseline and stable Postop Assessment: no apparent nausea or vomiting Anesthetic complications: no   No notable events documented.   Last Vitals:  Vitals:   07/17/22 1330 07/17/22 1340  BP: (!) 141/67 134/68  Pulse: 67 66  Resp: 14 12  Temp: (!) 35.8 C   SpO2: 99% 98%    Last Pain:  Vitals:   07/17/22 1330  TempSrc: Temporal  PainSc: Asleep                 Ilene Qua

## 2022-07-17 NOTE — Op Note (Signed)
[facility] Patient Name: Sydney Gillespie Procedure Date: 07/17/2022 1:07 PM MRN: [id number] Account #: [id number] Date of Birth: 1940-11-24 Admit Type: Outpatient Age: 82 Room: [facility] ENDO ROOM 1 Gender: Female Note Status: Finalized Instrument Name: Upper Endoscope [id number] Procedure:             Upper GI endoscopy Indications:           Dysphagia Providers:             Andrey Farmer MD, MD Referring MD:          [name], MD (Referring MD) Medicines:             Monitored Anesthesia Care Complications:         No immediate complications. Procedure:             Pre-Anesthesia Assessment:                        - Prior to the procedure, a History and Physical was                         performed, and patient medications and allergies were                         reviewed. The patient is competent. The risks and                         benefits of the procedure and the sedation options and                         risks were discussed with the patient. All questions                         were answered and informed consent was obtained.                         Patient identification and proposed procedure were                         verified by the physician, the nurse, the                         anesthesiologist, the anesthetist and the technician                         in the endoscopy suite. Mental Status Examination:                         alert and oriented. Airway Examination: normal                         oropharyngeal airway and neck mobility. Respiratory                         Examination: clear to auscultation. CV Examination:                         normal. Prophylactic Antibiotics: The patient does not  require prophylactic antibiotics. Prior                         Anticoagulants: The patient has taken Eliquis                         (apixaban), last dose was 3 days prior to procedure.                          ASA Grade Assessment: III - A patient with severe                         systemic disease. After reviewing the risks and                         benefits, the patient was deemed in satisfactory                         condition to undergo the procedure. The anesthesia                         plan was to use monitored anesthesia care (MAC).                         Immediately prior to administration of medications,                         the patient was re-assessed for adequacy to receive                         sedatives. The heart rate, respiratory rate, oxygen                         saturations, blood pressure, adequacy of pulmonary                         ventilation, and response to care were monitored                         throughout the procedure. The physical status of the                         patient was re-assessed after the procedure.                        After obtaining informed consent, the endoscope was                         passed under direct vision. Throughout the procedure,                         the patient's blood pressure, pulse, and oxygen                         saturations were monitored continuously. The Endoscope                         was introduced through the mouth, and advanced to the  second part of duodenum. The upper GI endoscopy was                         accomplished without difficulty. The patient tolerated                         the procedure well. Findings:      Abnormal motility was noted in the esophagus. The cricopharyngeus was       abnormal. There is a decrease in motility of the esophageal body. 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 Normal peristalsis not noted.      A medium amount of food (residue) was found in the gastric body.      The examined duodenum was normal. Impression:            - Abnormal esophageal motility.                         - A medium amount of food (residue) in the stomach.                        - Normal examined duodenum.                        - No specimens collected. Recommendation:        - Discharge patient to home.                        - Resume previous diet.                        - Continue present medications.                        - Return to referring physician as previously                         scheduled. Suspect dysphagia is due to a medicine such                         as opioids. Could consider manometry for further                         evaluation. Procedure Code(s):     --- Professional ---                        [phone], Esophagogastroduodenoscopy, flexible,                         transoral; diagnostic, including collection of                         specimen(s) by brushing or washing, when performed                         (separate procedure) Diagnosis Code(s):     --- Professional ---                        K22.4,  Dyskinesia of esophagus                        R13.10, Dysphagia, unspecified CPT copyright 2022 [employer]. All rights reserved. The codes documented in this report are preliminary and upon coder review may  be revised to meet current compliance requirements. Andrey Farmer MD, MD 07/17/2022 1:34:07 PM Number of Addenda: 0 Note Initiated On: 07/17/2022 1:07 PM Estimated Blood Loss:  Estimated blood loss: none.      [facility]

## 2022-07-20 ENCOUNTER — Inpatient Hospital Stay: Payer: Medicare Other | Attending: Internal Medicine

## 2022-07-20 ENCOUNTER — Encounter: Payer: Self-pay | Admitting: Gastroenterology

## 2022-07-20 ENCOUNTER — Inpatient Hospital Stay (HOSPITAL_BASED_OUTPATIENT_CLINIC_OR_DEPARTMENT_OTHER): Payer: Medicare Other | Admitting: Internal Medicine

## 2022-07-20 ENCOUNTER — Inpatient Hospital Stay: Payer: Medicare Other

## 2022-07-20 VITALS — BP 140/73 | HR 85 | Temp 99.8°F | Resp 18 | Wt 138.4 lb

## 2022-07-20 DIAGNOSIS — U071 COVID-19: Secondary | ICD-10-CM | POA: Insufficient documentation

## 2022-07-20 DIAGNOSIS — Z79899 Other long term (current) drug therapy: Secondary | ICD-10-CM | POA: Diagnosis not present

## 2022-07-20 DIAGNOSIS — C9000 Multiple myeloma not having achieved remission: Secondary | ICD-10-CM | POA: Diagnosis not present

## 2022-07-20 DIAGNOSIS — Z7901 Long term (current) use of anticoagulants: Secondary | ICD-10-CM | POA: Insufficient documentation

## 2022-07-20 DIAGNOSIS — R0602 Shortness of breath: Secondary | ICD-10-CM | POA: Insufficient documentation

## 2022-07-20 DIAGNOSIS — R110 Nausea: Secondary | ICD-10-CM | POA: Diagnosis not present

## 2022-07-20 DIAGNOSIS — R5383 Other fatigue: Secondary | ICD-10-CM | POA: Diagnosis not present

## 2022-07-20 DIAGNOSIS — Z7961 Long term (current) use of immunomodulator: Secondary | ICD-10-CM | POA: Diagnosis not present

## 2022-07-20 DIAGNOSIS — D649 Anemia, unspecified: Secondary | ICD-10-CM | POA: Diagnosis not present

## 2022-07-20 DIAGNOSIS — R1310 Dysphagia, unspecified: Secondary | ICD-10-CM | POA: Insufficient documentation

## 2022-07-20 DIAGNOSIS — M069 Rheumatoid arthritis, unspecified: Secondary | ICD-10-CM | POA: Diagnosis not present

## 2022-07-20 DIAGNOSIS — C9002 Multiple myeloma in relapse: Secondary | ICD-10-CM | POA: Insufficient documentation

## 2022-07-20 DIAGNOSIS — Z9481 Bone marrow transplant status: Secondary | ICD-10-CM | POA: Diagnosis not present

## 2022-07-20 DIAGNOSIS — M549 Dorsalgia, unspecified: Secondary | ICD-10-CM | POA: Insufficient documentation

## 2022-07-20 DIAGNOSIS — K5900 Constipation, unspecified: Secondary | ICD-10-CM | POA: Insufficient documentation

## 2022-07-20 DIAGNOSIS — F419 Anxiety disorder, unspecified: Secondary | ICD-10-CM | POA: Insufficient documentation

## 2022-07-20 DIAGNOSIS — N183 Chronic kidney disease, stage 3 unspecified: Secondary | ICD-10-CM | POA: Diagnosis not present

## 2022-07-20 LAB — CBC WITH DIFFERENTIAL/PLATELET
Abs Immature Granulocytes: 0.17 10*3/uL — ABNORMAL HIGH (ref 0.00–0.07)
Basophils Absolute: 0.1 10*3/uL (ref 0.0–0.1)
Basophils Relative: 1 %
Eosinophils Absolute: 0 10*3/uL (ref 0.0–0.5)
Eosinophils Relative: 0 %
HCT: 31 % — ABNORMAL LOW (ref 36.0–46.0)
Hemoglobin: 9.3 g/dL — ABNORMAL LOW (ref 12.0–15.0)
Immature Granulocytes: 1 %
Lymphocytes Relative: 12 %
Lymphs Abs: 1.5 10*3/uL (ref 0.7–4.0)
MCH: 30.1 pg (ref 26.0–34.0)
MCHC: 30 g/dL (ref 30.0–36.0)
MCV: 100.3 fL — ABNORMAL HIGH (ref 80.0–100.0)
Monocytes Absolute: 0.9 10*3/uL (ref 0.1–1.0)
Monocytes Relative: 7 %
Neutro Abs: 9.7 10*3/uL — ABNORMAL HIGH (ref 1.7–7.7)
Neutrophils Relative %: 79 %
Platelets: 427 10*3/uL — ABNORMAL HIGH (ref 150–400)
RBC: 3.09 MIL/uL — ABNORMAL LOW (ref 3.87–5.11)
RDW: 15.9 % — ABNORMAL HIGH (ref 11.5–15.5)
WBC: 12.3 10*3/uL — ABNORMAL HIGH (ref 4.0–10.5)
nRBC: 0 % (ref 0.0–0.2)

## 2022-07-20 LAB — IRON AND TIBC
Iron: 29 ug/dL (ref 28–170)
Saturation Ratios: 13 % (ref 10.4–31.8)
TIBC: 217 ug/dL — ABNORMAL LOW (ref 250–450)
UIBC: 188 ug/dL

## 2022-07-20 LAB — COMPREHENSIVE METABOLIC PANEL
ALT: 7 U/L (ref 0–44)
AST: 23 U/L (ref 15–41)
Albumin: 3.4 g/dL — ABNORMAL LOW (ref 3.5–5.0)
Alkaline Phosphatase: 68 U/L (ref 38–126)
Anion gap: 11 (ref 5–15)
BUN: 15 mg/dL (ref 8–23)
CO2: 24 mmol/L (ref 22–32)
Calcium: 9.3 mg/dL (ref 8.9–10.3)
Chloride: 98 mmol/L (ref 98–111)
Creatinine, Ser: 1.39 mg/dL — ABNORMAL HIGH (ref 0.44–1.00)
GFR, Estimated: 38 mL/min — ABNORMAL LOW (ref >60)
Glucose, Bld: 110 mg/dL — ABNORMAL HIGH (ref 70–99)
Potassium: 4 mmol/L (ref 3.5–5.1)
Sodium: 133 mmol/L — ABNORMAL LOW (ref 135–145)
Total Bilirubin: 0.5 mg/dL (ref 0.3–1.2)
Total Protein: 7.2 g/dL (ref 6.5–8.1)

## 2022-07-20 LAB — FERRITIN: Ferritin: 214 ng/mL (ref 11–307)

## 2022-07-20 MED ORDER — ZOLEDRONIC ACID 4 MG/5ML IV CONC
3.0000 mg | Freq: Once | INTRAVENOUS | Status: AC
  Administered 2022-07-20: 3 mg via INTRAVENOUS
  Filled 2022-07-20: qty 3.75

## 2022-07-20 MED ORDER — SODIUM CHLORIDE 0.9 % IV SOLN
Freq: Once | INTRAVENOUS | Status: AC
  Filled 2022-07-20: qty 250

## 2022-07-20 MED ORDER — OLANZAPINE 10 MG PO TABS: 10.0000 mg | ORAL_TABLET | Freq: Every day | ORAL | 3 refills | Status: DC

## 2022-07-20 NOTE — Progress Notes (Signed)
Early satiation with essentially no appetite. Had EGD on Friday, looks like a motility issue as there was a hydrocodone in esophagus and food still in stomach. Has some mild swelling in right LE. Her Right leg is involved with all the right side of back pain. Pain is 10/10. Wearing back brace. Kyphoplasty is next week. Leaving this Weds for [place] to visit family she will be back on Monday.

## 2022-07-20 NOTE — Progress Notes (Signed)
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  No evidence of organ dysfunction; continue close surveillance.  # NOV 2023- NOV 2023- REPEAT BONE MARROW-plasma cells cell 10-20%.  Kappa lambda light chain ratio-rising; Lamda light chain- 329. NOV 2023-skeletal survey no lytic lesions.  Worsening renal insufficiency; GFR 20s; and also worsening anemia hemoglobin 9.8; iron saturation 20%.   # DEC 7th, 2023- DARA SQ+ Dex 20 mg; REV- hold cycle #1.   # CKD [~ creat 1.35- 1.7] ; Dr.Singh  # 2012- LEFT KIDNEY ? 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 benign   Multiple myeloma not having achieved remission ([place])  12/18/2015 Initial Diagnosis   Multiple myeloma in remission ([place])   04/16/2022 -  Chemotherapy   Patient is on Treatment Plan : MYELOMA RELAPSED REFRACTORY Daratumumab SQ + Lenalidomide + Dexamethasone (DaraRd) q28d      INTERVAL HISTORY: Ambulating in a wheel chair.  Accompanied by her granddaughter.   This is a pleasant 82  year-old female patient with above history of relapsed multiple Myeloma [DEC 2023]-currently on daratumumab SQ treatments is here for follow-up. Has not started Revlimid and has not received yet.  Patient received daratumumab cycle number 1 day about  on 12/07.  However, therapy currently on hold because of admission for  COVID-pneumonia/deconditioning; vertebral compression fractures/difficulty swallowing etc.  Patient had EGD on Friday, looks like a motility issue as there was a hydrocodone in esophagus and food still in stomach.   Has some mild swelling in right LE.   Her Right leg is involved with all the right side of back pain. Pain is 10/10. Wearing back brace. Kyphoplasty is next week. Currently has constant back pain. Wearing a brace. Taking hydrocodone 5/325 mg BID; with muscle relaxant.  No appetite. Makes herself eat. No weight loss  Drinking 1-2 boosts a day. Status post evaluation with nutrition. Complains of constipation- better.  As per the granddaughter patient seems to be improving although slightly.  Leaving this Weds for [place] to visit family she will be back on Monday.   Review of Systems  Constitutional:  Positive for malaise/fatigue. Negative for diaphoresis and weight loss.  HENT:  Negative for nosebleeds and sore throat.   Eyes:  Negative for double vision.  Respiratory:  Positive for shortness of breath. Negative for hemoptysis and wheezing.   Cardiovascular:  Negative for chest pain, palpitations, orthopnea and leg swelling.  Gastrointestinal:  Positive for nausea. Negative for abdominal pain, blood in stool, constipation, diarrhea, heartburn, melena and vomiting.  Genitourinary:  Negative for dysuria, frequency and urgency.  Musculoskeletal:  Positive for back pain, joint pain and myalgias.  Skin: Negative.  Negative for itching and rash.  Neurological:  Positive for tingling. Negative for dizziness, focal weakness, weakness and headaches.  Endo/Heme/Allergies:  Does not bruise/bleed easily.  Psychiatric/Behavioral:  Negative for depression. The patient is not nervous/anxious and does not have insomnia.  ALLERGIES:  is allergic to isoniazid.  MEDICATIONS:  Current Outpatient Medications  Medication Sig Dispense Refill   acetaminophen (TYLENOL) 325 MG tablet Take 650 mg by  mouth every 6 (six) hours as needed for fever or headache.     acyclovir (ZOVIRAX) 400 MG tablet Take 1 tablet (400 mg total) by mouth 2 (two) times daily. 60 tablet 4   albuterol (VENTOLIN HFA) 108 (90 Base) MCG/ACT inhaler Inhale 2 puffs into the lungs every 4 (four) hours as needed for wheezing or shortness of breath. (Patient not taking: Reported on 06/19/2022) 8 g 0   allopurinol (ZYLOPRIM) 300 MG tablet Take 450 mg by mouth daily.     ALPRAZolam (XANAX) 0.25 MG tablet TAKE 1 TABLET(0.25 MG) BY MOUTH AT BEDTIME AS NEEDED FOR ANXIETY (Patient taking differently: Take 0.25 mg by mouth at bedtime as needed for anxiety or sleep.) 30 tablet 1   apixaban (ELIQUIS) 5 MG TABS tablet Take 1 tablet (5 mg total) by mouth 2 (two) times daily. 60 tablet 1   Ascorbic Acid (VITAMIN C) 1000 MG tablet Take 1,000 mg by mouth daily.     atenolol (TENORMIN) 50 MG tablet Take 0.5 tablets (25 mg total) by mouth daily. 30 tablet 0   benzonatate (TESSALON) 200 MG capsule Take 1 capsule (200 mg total) by mouth 3 (three) times daily as needed for cough. (Patient not taking: Reported on 05/21/2022) 30 capsule 0   CALCIUM MAGNESIUM ZINC PO Take 1 tablet by mouth in the morning, at noon, and at bedtime.     cetirizine (ZYRTEC) 10 MG tablet Take 10 mg by mouth 2 (two) times daily.      chlorpheniramine-HYDROcodone (TUSSIONEX) 10-8 MG/5ML Take 5 mLs by mouth every 12 (twelve) hours as needed (for cough unrelieved by other medications). May cause drowsiness. (Patient not taking: Reported on 06/19/2022) 140 mL 0   docusate sodium (COLACE) 100 MG capsule Take 100 mg by mouth 2 (two) times daily.     DULoxetine (CYMBALTA) 20 MG capsule Take 20 mg by mouth 2 (two) times daily.     fluticasone (FLONASE) 50 MCG/ACT nasal spray Place 2 sprays into the nose daily.  (Patient not taking: Reported on 06/19/2022)     furosemide (LASIX) 40 MG tablet Take 1 tablet (40 mg total) by mouth daily. 30 tablet 1   guaiFENesin (MUCINEX) 600 MG 12 hr  tablet Take 1 tablet (600 mg total) by mouth 2 (two) times daily. (Patient not taking: Reported on 05/21/2022) 14 tablet 0   HYDROcodone-acetaminophen (NORCO/VICODIN) 5-325 MG tablet Take 1 tablet by mouth every 8 (eight) hours as needed for moderate pain. 60 tablet 0   hydroxychloroquine (PLAQUENIL) 200 MG tablet Take 200 mg by mouth 2 (two) times daily.     ipratropium (ATROVENT HFA) 17 MCG/ACT inhaler Inhale 2 puffs into the lungs every 4 (four) hours as needed for wheezing. (Patient not taking: Reported on 06/19/2022)     Ipratropium-Albuterol (COMBIVENT) 20-100 MCG/ACT AERS respimat Inhale 1 puff into the lungs every 6 (six) hours as needed for wheezing. (Patient not taking: Reported on 06/19/2022) 4 g 0   lansoprazole (PREVACID) 15 MG capsule Take 30 mg by mouth 2 (two) times daily before a meal.     lansoprazole (PREVACID) 30 MG capsule Take 1 capsule (30 mg total) by mouth 2 (two) times daily before a meal. 60 capsule 2   lenalidomide (REVLIMID) 10 MG capsule Take 1 capsule (10 mg total) by mouth daily. Take for 21 days,  then hold for 7 days. Repeat every 28 days. (Patient not taking: Reported on 05/21/2022) 21 capsule 0   levothyroxine (SYNTHROID) 112 MCG tablet Take 112 mcg by mouth daily before breakfast.     MYRBETRIQ 50 MG TB24 tablet Take 50 mg by mouth daily.     OLANZapine (ZYPREXA) 10 MG tablet Take 1 tablet (10 mg total) by mouth at bedtime. 30 tablet 3   ondansetron (ZOFRAN) 8 MG tablet Take 4-8 mg by mouth every 8 (eight) hours as needed for nausea or vomiting. (Patient not taking: Reported on 06/19/2022)     oxybutynin (DITROPAN-XL) 10 MG 24 hr tablet Take 1 tablet (10 mg total) by mouth daily. 30 tablet 11   Potassium 99 MG TABS Take 1 tablet by mouth daily.     rOPINIRole (REQUIP) 0.5 MG tablet Take 0.75 mg by mouth at bedtime as needed (Restless leg).     Spacer/Aero-Holding Chambers DEVI 1 each by Does not apply route as needed. 1 each 0   tiZANidine (ZANAFLEX) 2 MG tablet Take 2 mg  by mouth at bedtime.     vitamin B-12 (CYANOCOBALAMIN) 1000 MCG tablet Take 1,000 mcg by mouth daily.     zinc gluconate 50 MG tablet Take 50 mg by mouth daily.     No current facility-administered medications for this visit.   Facility-Administered Medications Ordered in Other Visits  Medication Dose Route Frequency Provider Last Rate Last Admin   zoledronic acid (ZOMETA) 3 mg in sodium chloride 0.9 % 100 mL IVPB  3 mg Intravenous Once Cammie Sickle, MD 415 mL/hr at 07/20/22 1127 3 mg at 07/20/22 1127    PHYSICAL EXAMINATION: ECOG PERFORMANCE STATUS: 0 - Asymptomatic  BP (!) 140/73   Pulse 85   Temp 99.8 F (37.7 C) (Tympanic)   Resp 18   Wt 138 lb 6.4 oz (62.8 kg)   SpO2 96%   BMI 23.03 kg/m   Filed Weights   07/20/22 0953  Weight: 138 lb 6.4 oz (62.8 kg)     Patient appears weak.   Physical Exam HENT:     Head: Normocephalic and atraumatic.     Mouth/Throat:     Pharynx: No oropharyngeal exudate.  Eyes:     Pupils: Pupils are equal, round, and reactive to light.  Cardiovascular:     Rate and Rhythm: Normal rate and regular rhythm.  Pulmonary:     Effort: No respiratory distress.     Breath sounds: No wheezing.  Abdominal:     General: Bowel sounds are normal. There is no distension.     Palpations: Abdomen is soft. There is no mass.     Tenderness: There is no abdominal tenderness. There is no guarding or rebound.  Musculoskeletal:        General: No tenderness. Normal range of motion.     Cervical back: Normal range of motion and neck supple.  Skin:    General: Skin is warm.  Neurological:     Mental Status: She is alert and oriented to person, place, and time.  Psychiatric:        Mood and Affect: Affect normal.     LABORATORY DATA:  I have reviewed the data as listed    Component Value Date/Time   NA 133 (L) 07/20/2022 0929   NA 142 08/16/2013 1028   K 4.0 07/20/2022 0929   K 4.5 08/16/2013 1028   CL 98 07/20/2022 0929   CL 106  08/16/2013 1028   CO2  24 07/20/2022 0929   CO2 27 08/16/2013 1028   GLUCOSE 110 (H) 07/20/2022 0929   GLUCOSE 99 08/16/2013 1028   BUN 15 07/20/2022 0929   BUN 21 (H) 08/16/2013 1028   CREATININE 1.39 (H) 07/20/2022 0929   CREATININE 1.42 (H) 09/07/2014 0954   CALCIUM 9.3 07/20/2022 0929   CALCIUM 9.3 09/07/2014 0954   PROT 7.2 07/20/2022 0929   PROT 7.4 08/16/2013 1028   ALBUMIN 3.4 (L) 07/20/2022 0929   ALBUMIN 3.9 08/16/2013 1028   AST 23 07/20/2022 0929   AST 27 08/16/2013 1028   ALT 7 07/20/2022 0929   ALT 15 08/16/2013 1028   ALKPHOS 68 07/20/2022 0929   ALKPHOS 61 08/16/2013 1028   BILITOT 0.5 07/20/2022 0929   BILITOT 0.3 08/16/2013 1028   GFRNONAA 38 (L) 07/20/2022 0929   GFRNONAA 36 (L) 09/07/2014 0954   GFRAA 19 (L) 02/09/2020 1001   GFRAA 42 (L) 09/07/2014 0954    No results found for: "SPEP", "UPEP"  Lab Results  Component Value Date   WBC 12.3 (H) 07/20/2022   NEUTROABS 9.7 (H) 07/20/2022   HGB 9.3 (L) 07/20/2022   HCT 31.0 (L) 07/20/2022   MCV 100.3 (H) 07/20/2022   PLT 427 (H) 07/20/2022      Chemistry      Component Value Date/Time   NA 133 (L) 07/20/2022 0929   NA 142 08/16/2013 1028   K 4.0 07/20/2022 0929   K 4.5 08/16/2013 1028   CL 98 07/20/2022 0929   CL 106 08/16/2013 1028   CO2 24 07/20/2022 0929   CO2 27 08/16/2013 1028   BUN 15 07/20/2022 0929   BUN 21 (H) 08/16/2013 1028   CREATININE 1.39 (H) 07/20/2022 0929   CREATININE 1.42 (H) 09/07/2014 0954      Component Value Date/Time   CALCIUM 9.3 07/20/2022 0929   CALCIUM 9.3 09/07/2014 0954   ALKPHOS 68 07/20/2022 0929   ALKPHOS 61 08/16/2013 1028   AST 23 07/20/2022 0929   AST 27 08/16/2013 1028   ALT 7 07/20/2022 0929   ALT 15 08/16/2013 1028   BILITOT 0.5 07/20/2022 0929   BILITOT 0.3 08/16/2013 1028       RADIOGRAPHIC STUDIES: I have personally reviewed the radiological images as listed and agreed with the findings in the report. No results found.   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  Kappa lambda light chain ratio-rising; Lamda light chain- 329. NOV 2023-skeletal survey no lytic lesions.  Worsening renal insufficiency; GFR 20s; and also worsening anemia hemoglobin 9.8; iron saturation 20%.  Currently on daratumumab subcu with plan to add revlimid with cycle #2. Dara cycle 1; day 1 on 12/07; currently on HOLD-see below  # Continue to HOLD DARA cycle  #1-day 8 today. Labs today reviewed;  given acute issues below [recent COVID/pneumonia/declining performance status/vertebral compression fractures]. JAN 2024- M proetin 0.3; Kappa=71 [OCT 2023- BL-0.7;  BL- 329]; reviewed the myeloma panel is better just as 1 treatment.  Recommend holding any therapy at this time.  Will repeat myeloma panel in 1 month.   # Continue to HOLD dara for now; Recent COVID infection/secondary pneumonia -immunocompromised status/second of multiple myeloma/dara SQ injections- HOLD dara/Revlimid script at this time.   # Discussed  the role of Zometa to decrease skeletal related events [pain; fractures; need for radiation; hypercalcemia].  Discussed the potential side effects including but not limited to-infusion reaction; hypocalcemia; and Osteo-necrosis of jaw. S/p dental evaluation- clearance with Zometa.  Calcium 9.4.  Proceed with Zometa.  Discussed regarding infusion reaction from Zometa.  Recommend Tylenol/Benadryl as needed.   #  DEC 29th, 23rd -Nonocclusive thrombus in one of the posterior tibial veins of the right calf- continue eliquis for total of 3 months.  Given the need for upcoming surgery-HOLD Eliquis 3 days prior to surgery; and start 2 days after the surgery.   # Compression fractures- on brace  [Dr.Jenkins; [place]] rib fractures- on hydrocodone 1 pill q 8 hrs prn; Tyelenol prn; Tinazidine BID.  On Zometa q monthly.  Continue calcium plus vitamin D.  # Dysphagia/weight loss- -Status post EGD/ [FEB 2024][[facility]]-abnormal motility noted; ?  Distal esophagus spasm noted question related to opioids versus others.  Recommend manometric studies; awaiting coming off narcotics.   On zyprexa 5 mg/day   # CKD stage III- IVcreatinine-creatinine GFR-32-[Dr.Singh] stable;  continue vit D  # RA- [on Plaquenil]- stable  # Anxiety/nausea- prescribed/refilled  xanax 0.25 mg qhs [further refills to PCP]-continue Zofran as needed. Will increase zyprexa to 10 mg/day.   # Vaccination:  flu shot s/p;  Covid booster; RSV- discussed   #DISPOSITION:  # Zometa # follow up in 4  week-  MD;  labs-cbc/cmp;MM panel; K/l light chains; iron studies;ferritin; zometa-- Dr.B  Cc; Dr.Hande/ Dr.Singh      Cammie Sickle, MD 07/20/2022 11:34 AM

## 2022-07-20 NOTE — Patient Instructions (Signed)

## 2022-07-20 NOTE — Assessment & Plan Note (Addendum)
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  Kappa lambda light chain ratio-rising; Lamda light chain- 329. NOV 2023-skeletal survey no lytic lesions.  Worsening renal insufficiency; GFR 20s; and also worsening anemia hemoglobin 9.8; iron saturation 20%.  Currently on daratumumab subcu with plan to add revlimid with cycle #2. Dara cycle 1; day 1 on 12/07; currently on HOLD-see below  # Continue to HOLD DARA cycle  #1-day 8 today. Labs today reviewed;  given acute issues below [recent COVID/pneumonia/declining performance status/vertebral compression fractures]. JAN 2024- M proetin 0.3; Kappa=71 [OCT 2023- BL-0.7;  BL- 329]; reviewed the myeloma panel is better just as 1 treatment.  Recommend holding any therapy at this time.  Will repeat myeloma panel in 1 month.   # Continue to HOLD dara for now; Recent COVID infection/secondary pneumonia -immunocompromised status/second of multiple myeloma/dara SQ injections- HOLD dara/Revlimid script at this time.   # Discussed the role of Zometa to decrease skeletal related events [pain; fractures; need for radiation; hypercalcemia].  Discussed the potential side effects including but not limited to-infusion reaction; hypocalcemia; and Osteo-necrosis of jaw. S/p dental evaluation- clearance with Zometa.  Calcium 9.4.  Proceed with Zometa.  Discussed regarding infusion reaction from Zometa.  Recommend Tylenol/Benadryl as needed.   #  DEC 29th, 23rd -Nonocclusive thrombus in one of the posterior tibial veins of the right calf- continue eliquis for total of 3 months.  Given the need for upcoming surgery-HOLD Eliquis 3 days prior to surgery; and start 2 days after the surgery.   # Compression fractures- on brace [Dr.Jenkins; [place]] rib fractures- on hydrocodone 1 pill q 8 hrs prn;  Tyelenol prn; Tinazidine BID.  On Zometa q monthly.  Continue calcium plus vitamin D.  # Dysphagia/weight loss- -Status post EGD/ [FEB 2024][[facility]]-abnormal motility noted; ?  Distal esophagus spasm noted question related to opioids versus others.  Recommend manometric studies; awaiting coming off narcotics.   On zyprexa 5 mg/day   # CKD stage III- IVcreatinine-creatinine GFR-32-[Dr.Singh] stable;  continue vit D  # RA- [on Plaquenil]- stable  # Anxiety/nausea- prescribed/refilled  xanax 0.25 mg qhs [further refills to PCP]-continue Zofran as needed. Will increase zyprexa to 10 mg/day.   # Vaccination:  flu shot s/p;  Covid booster; RSV- discussed   #DISPOSITION:  # Zometa # follow up in 4  week-  MD;  labs-cbc/cmp;MM panel; K/l light chains; iron studies;ferritin; zometa-- Dr.B  Cc; Dr.Hande/ Dr.Singh

## 2022-07-21 LAB — KAPPA/LAMBDA LIGHT CHAINS
Kappa free light chain: 61.2 mg/L — ABNORMAL HIGH (ref 3.3–19.4)
Kappa, lambda light chain ratio: 1.87 — ABNORMAL HIGH (ref 0.26–1.65)
Lambda free light chains: 32.8 mg/L — ABNORMAL HIGH (ref 5.7–26.3)

## 2022-07-27 NOTE — Pre-Procedure Instructions (Signed)
Surgical Instructions    Your procedure is scheduled on July 29, 2022.  Report to [facility] Main Entrance "A" at 2:00 P.M., then check in with the Admitting office.  Call this number if you have problems the morning of surgery:  [phone]  If you have any questions prior to your surgery date call [phone]: Open Monday-Friday 8am-4pm If you experience any cold or flu symptoms such as cough, fever, chills, shortness of breath, etc. between now and your scheduled surgery, please notify us at the above number.     Remember:  Do not eat after midnight the night before your surgery  You may drink clear liquids until 1:00 PM the afternoon of your surgery.   Clear liquids allowed are: Water, Non-Citrus Juices (without pulp), Carbonated Beverages, Clear Tea, Black Coffee Only (NO MILK, CREAM OR POWDERED CREAMER of any kind), and Gatorade.     Take these medicines the morning of surgery with A SIP OF WATER:  acyclovir (ZOVIRAX)   allopurinol (ZYLOPRIM)   atenolol (TENORMIN)   cetirizine (ZYRTEC)   docusate sodium (COLACE)   DULoxetine (CYMBALTA)   lansoprazole (PREVACID)   levothyroxine (SYNTHROID)   oxybutynin (DITROPAN-XL)   MYRBETRIQ    May take these medicines IF NEEDED:  acetaminophen (TYLENOL)   ALPRAZolam (XANAX)   HYDROcodone-acetaminophen (NORCO/VICODIN)    STOP taking your apixaban (ELIQUIS) three days prior to surgery. Your last dose of this medication will be March 16th.   As of today, STOP taking any Aspirin (unless otherwise instructed by your surgeon) Aleve, Naproxen, Ibuprofen, Motrin, Advil, Goody's, BC's, all herbal medications, fish oil, and all vitamins.   Continue to take your Potassium supplement up until the day before surgery. DO NOT take any the morning of surgery.                     Do NOT Smoke (Tobacco/Vaping) for 24 hours prior to your procedure.  If you use a CPAP at night, you may bring your mask/headgear for your overnight stay.    Contacts, glasses, piercing's, hearing aid's, dentures or partials may not be worn into surgery, please bring cases for these belongings.    For patients admitted to the hospital, discharge time will be determined by your treatment team.   Patients discharged the day of surgery will not be allowed to drive home, and someone needs to stay with them for 24 hours.  SURGICAL WAITING ROOM VISITATION Patients having surgery or a procedure may have no more than 2 support people in the waiting area - these visitors may rotate.   Children under the age of 40 must have an adult with them who is not the patient. If the patient needs to stay at the hospital during part of their recovery, the visitor guidelines for inpatient rooms apply. Pre-op nurse will coordinate an appropriate time for 1 support person to accompany patient in pre-op.  This support person may not rotate.   Please refer to the [facility] website for the visitor guidelines for Inpatients (after your surgery is over and you are in a regular room).    Special instructions:   [place]- Preparing For Surgery  Before surgery, you can play an important role. Because skin is not sterile, your skin needs to be as free of germs as possible. You can reduce the number of germs on your skin by washing with CHG (chlorahexidine gluconate) Soap before surgery.  CHG is an antiseptic cleaner which kills germs and bonds with the  skin to continue killing germs even after washing.    Oral Hygiene is also important to reduce your risk of infection.  Remember - BRUSH YOUR TEETH THE MORNING OF SURGERY WITH YOUR REGULAR TOOTHPASTE  Please do not use if you have an allergy to CHG or antibacterial soaps. If your skin becomes reddened/irritated stop using the CHG.  Do not shave (including legs and underarms) for at least 48 hours prior to first CHG shower. It is OK to shave your face.  Please follow these instructions carefully.   Shower the NIGHT BEFORE  SURGERY and the MORNING OF SURGERY  If you chose to wash your hair, wash your hair first as usual with your normal shampoo.  After you shampoo, rinse your hair and body thoroughly to remove the shampoo.  Use CHG Soap as you would any other liquid soap. You can apply CHG directly to the skin and wash gently with a scrungie or a clean washcloth.   Apply the CHG Soap to your body ONLY FROM THE NECK DOWN.  Do not use on open wounds or open sores. Avoid contact with your eyes, ears, mouth and genitals (private parts). Wash Face and genitals (private parts)  with your normal soap.   Wash thoroughly, paying special attention to the area where your surgery will be performed.  Thoroughly rinse your body with warm water from the neck down.  DO NOT shower/wash with your normal soap after using and rinsing off the CHG Soap.  Pat yourself dry with a CLEAN TOWEL.  Wear CLEAN PAJAMAS to bed the night before surgery  Place CLEAN SHEETS on your bed the night before your surgery  DO NOT SLEEP WITH PETS.   Day of Surgery: Take a shower with CHG soap. Do not wear jewelry or makeup Do not wear lotions, powders, perfumes/colognes, or deodorant. Do not shave 48 hours prior to surgery.  Men may shave face and neck. Do not bring valuables to the hospital.  [facility] is not responsible for any belongings or valuables. Do not wear nail polish, gel polish, artificial nails, or any other type of covering on natural nails (fingers and toes) If you have artificial nails or gel coating that need to be removed by a nail salon, please have this removed prior to surgery. Artificial nails or gel coating may interfere with anesthesia's ability to adequately monitor your vital signs.  Wear Clean/Comfortable clothing the morning of surgery Remember to brush your teeth WITH YOUR REGULAR TOOTHPASTE.   Please read over the following fact sheets that you were given.    If you received a COVID test during your  pre-op visit  it is requested that you wear a mask when out in public, stay away from anyone that may not be feeling well and notify your surgeon if you develop symptoms. If you have been in contact with anyone that has tested positive in the last 10 days please notify you surgeon.

## 2022-07-28 ENCOUNTER — Encounter (HOSPITAL_COMMUNITY): Payer: Self-pay

## 2022-07-28 ENCOUNTER — Other Ambulatory Visit: Payer: Self-pay

## 2022-07-28 ENCOUNTER — Encounter (HOSPITAL_COMMUNITY)
Admission: RE | Admit: 2022-07-28 | Discharge: 2022-07-28 | Disposition: A | Discharge status: Home / Self Care | Payer: Medicare Other | Source: Ambulatory Visit | Attending: Neurosurgery | Admitting: Neurosurgery

## 2022-07-28 VITALS — BP 133/60 | HR 76 | Temp 98.0°F | Resp 19 | Ht 65.0 in | Wt 135.0 lb

## 2022-07-28 DIAGNOSIS — N183 Chronic kidney disease, stage 3 unspecified: Secondary | ICD-10-CM | POA: Insufficient documentation

## 2022-07-28 DIAGNOSIS — D649 Anemia, unspecified: Secondary | ICD-10-CM | POA: Diagnosis not present

## 2022-07-28 DIAGNOSIS — R1310 Dysphagia, unspecified: Secondary | ICD-10-CM | POA: Insufficient documentation

## 2022-07-28 DIAGNOSIS — Z86718 Personal history of other venous thrombosis and embolism: Secondary | ICD-10-CM | POA: Insufficient documentation

## 2022-07-28 DIAGNOSIS — Z01812 Encounter for preprocedural laboratory examination: Secondary | ICD-10-CM | POA: Insufficient documentation

## 2022-07-28 DIAGNOSIS — I083 Combined rheumatic disorders of mitral, aortic and tricuspid valves: Secondary | ICD-10-CM | POA: Diagnosis not present

## 2022-07-28 DIAGNOSIS — M069 Rheumatoid arthritis, unspecified: Secondary | ICD-10-CM | POA: Insufficient documentation

## 2022-07-28 DIAGNOSIS — Z7901 Long term (current) use of anticoagulants: Secondary | ICD-10-CM | POA: Insufficient documentation

## 2022-07-28 DIAGNOSIS — Z8579 Personal history of other malignant neoplasms of lymphoid, hematopoietic and related tissues: Secondary | ICD-10-CM | POA: Diagnosis not present

## 2022-07-28 DIAGNOSIS — E871 Hypo-osmolality and hyponatremia: Secondary | ICD-10-CM | POA: Diagnosis not present

## 2022-07-28 DIAGNOSIS — R9431 Abnormal electrocardiogram [ECG] [EKG]: Secondary | ICD-10-CM | POA: Diagnosis not present

## 2022-07-28 DIAGNOSIS — Z01818 Encounter for other preprocedural examination: Secondary | ICD-10-CM

## 2022-07-28 HISTORY — DX: Polyneuropathy, unspecified: G62.9

## 2022-07-28 LAB — MULTIPLE MYELOMA PANEL, SERUM
Albumin SerPl Elph-Mcnc: 3 g/dL (ref 2.9–4.4)
Albumin/Glob SerPl: 1 (ref 0.7–1.7)
Alpha 1: 0.5 g/dL — ABNORMAL HIGH (ref 0.0–0.4)
Alpha2 Glob SerPl Elph-Mcnc: 1.2 g/dL — ABNORMAL HIGH (ref 0.4–1.0)
B-Globulin SerPl Elph-Mcnc: 0.7 g/dL (ref 0.7–1.3)
Gamma Glob SerPl Elph-Mcnc: 0.8 g/dL (ref 0.4–1.8)
Globulin, Total: 3.3 g/dL (ref 2.2–3.9)
IgA: 64 mg/dL (ref 64–422)
IgG (Immunoglobin G), Serum: 816 mg/dL (ref 586–1602)
IgM (Immunoglobulin M), Srm: 29 mg/dL (ref 26–217)
M Protein SerPl Elph-Mcnc: 0.2 g/dL — ABNORMAL HIGH
Total Protein ELP: 6.3 g/dL (ref 6.0–8.5)

## 2022-07-28 LAB — SURGICAL PCR SCREEN
MRSA, PCR: NEGATIVE
Staphylococcus aureus: NEGATIVE

## 2022-07-28 NOTE — Progress Notes (Signed)
PCP - Dr. [name] Cardiologist - Dr. Isaias Cowman (Pt sees more for familial cardiac hx than personal cardiac hx)  PPM/ICD - Denies Device Orders - n/a Rep Notified - n/a  Chest x-ray - 05/09/2022 EKG - 05/07/2022 Stress Test - 06/03/2018 - results requested ECHO - 05/07/2022 Cardiac Cath - Denies  Sleep Study - Denies CPAP - n/a  No DM  Last dose of GLP1 agonist- n/a GLP1 instructions: n/a  Blood Thinner Instructions: Hold Eliqius for 3 days. Last dose March 16th. Pt takes this for PE during recent hospitalization December 2023 for COVID PNA Aspirin Instructions: n/a  ERAS Protcol - Clear liquids until 1300 afternoon of surgery PRE-SURGERY Ensure or G2- n/a  COVID TEST- n/a   Anesthesia review: Yes. Stress test results requested. Pt had COVID PNA December 27th with hospitalization. PE developed during that time. She also has multiple myeloma.   Patient denies shortness of breath, fever, cough and chest pain at PAT appointment. Pt denies any respiratory illness/infection in the last two months.   All instructions explained to the patient, with a verbal understanding of the material. Patient agrees to go over the instructions while at home for a better understanding. Patient also instructed to self quarantine after being tested for COVID-19. The opportunity to ask questions was provided.

## 2022-07-28 NOTE — Progress Notes (Signed)
Anesthesia Chart Review:  82 year old female with history significant for multiple myeloma s/p bone marrow transplantation, rheumatoid arthritis, CKD 3/4.  Recent admission 05/06/2022 through 05/11/2022 for acute respiratory failure with hypoxia secondary to pneumonia with possible component of new diastolic CHF.  Patient initially required supplemental oxygen but was weaned off prior to discharge.  She was noted to have right leg DVT and discharged on Eliquis.  She had minimal troponin elevation felt secondary to acute diastolic CHF with preserved EF 60 to 65% and grade 1 DD on echo.  Cardiology consulted and recommended continuing atenolol 50 mg daily as well as diuresis.  She was also discovered to have acute compression deformity of T9 as well as stable remote compression deformity of T7 and T12.  Outpatient neurosurgery follow-up arranged.  History of prior benign cardiac evaluation in 2020 at [facility] with normal stress echocardiogram.  Results not available in Care Everywhere, however, in cardiology note from Dr. Saralyn Pilar dated 08/28/2019 he states, "The patient underwent a stress echocardiogram on 06/03/2018. She exercised for 6:18 minutes on a Bruce protocol without chest pain or ECG changes. At baseline, 2D echocardiogram revealed normal left ventricular function with LVEF greater than 55%. At peak exercise, there was appropriate augmentation of all myocardial segments. There was mild aortic, mitral, and tricuspid valve insufficiency."  Last seen by hematology/oncology Dr. Rogue Bussing 07/20/2022.  She is noted to be maintained on daratumumab SQ treatments.  However, therapy currently on hold because of recent mission for COVID-pneumonia/deconditioning as well as vertebral compression fractures.  She is also noted to have some difficulty swallowing.  EGD on 07/17/2022 showed a motility issue as there was a pill noted in the esophagus as well as food still in the stomach.  Patient was advised by Dr.  Rogue Bussing to hold Eliquis 3 days prior to surgery and restart 2 days after.  CMP and CBC from 07/20/2022 reviewed, creatinine mildly elevated 1.39 consistent with history of CKD, mild hyponatremia sodium 133, moderate anemia with hemoglobin 9.3 (appears near baseline).  EKG 05/06/2022: NSR.  Rate 82.  Moderate voltage criteria for LVH, may be normal variant.  Prolonged QT (QTcB 500).  No significant change.  TTE 05/07/2022:  1. Left ventricular ejection fraction, by estimation, is 60 to 65%. The  left ventricle has normal function. The left ventricle has no regional  wall motion abnormalities. Left ventricular diastolic parameters are  consistent with Grade I diastolic  dysfunction (impaired relaxation).   2. Right ventricular systolic function is normal. The right ventricular  size is normal.   3. The mitral valve is normal in structure. Mild mitral valve  regurgitation. No evidence of mitral stenosis.   4. Tricuspid valve regurgitation is mild to moderate.   5. The aortic valve is normal in structure. Aortic valve regurgitation is  mild to moderate. No aortic stenosis is present.   6. The inferior vena cava is normal in size with greater than 50%  respiratory variability, suggesting right atrial pressure of 3 mmHg.     Wynonia Musty [facility] Stay [facility]/Anesthesiology Phone [phone] 07/28/2022 11:09 AM

## 2022-07-28 NOTE — Anesthesia Preprocedure Evaluation (Signed)
Anesthesia Evaluation    Reviewed: Allergy & Precautions, Patient's Chart, lab work & pertinent test results  History of Anesthesia Complications Negative for: history of anesthetic complications  Airway Mallampati: II  TM Distance: >3 FB Neck ROM: Full    Dental  (+) Lower Dentures, Upper Dentures   Pulmonary   Hx TB 2011    Pulmonary exam normal        Cardiovascular + DVT  Normal cardiovascular exam+ Valvular Problems/Murmurs AI    '23 TTE - EF 60 to 65%. Grade I diastolic dysfunction (impaired relaxation). Mild mitral valve regurgitation. Tricuspid valve regurgitation is mild to moderate. Aortic valve regurgitation is mild to moderate.     Neuro/Psych  PSYCHIATRIC DISORDERS Anxiety Depression     Vertigo   Neuromuscular disease    GI/Hepatic Neg liver ROS,GERD  Medicated and Controlled,,  Endo/Other  Hypothyroidism    Renal/GU CRFRenal disease     Musculoskeletal  (+) Arthritis , Osteoarthritis and Rheumatoid disorders,   Gout    Abdominal   Peds  Hematology  (+) Blood dyscrasia, anemia  On eliquis Multiple myeloma S/p bone marrow transplant    Anesthesia Other Findings   Reproductive/Obstetrics                              Anesthesia Physical Anesthesia Plan  ASA: 3  Anesthesia Plan: General   Post-op Pain Management: Tylenol PO (pre-op)*   Induction: Intravenous  PONV Risk Score and Plan: 3 and Treatment may vary due to age or medical condition, Ondansetron and Propofol infusion  Airway Management Planned: Oral ETT  Additional Equipment: None  Intra-op Plan:   Post-operative Plan: Extubation in OR  Informed Consent: I have reviewed the patients History and Physical, chart, labs and discussed the procedure including the risks, benefits and alternatives for the proposed anesthesia with the patient or authorized representative who has indicated his/her  understanding and acceptance.     Dental advisory given  Plan Discussed with: CRNA and Anesthesiologist  Anesthesia Plan Comments: (PAT note by Karoline Caldwell, PA-C: 82 year old female with history significant for multiple myeloma s/p bone marrow transplantation, rheumatoid arthritis, CKD 3/4.  Recent admission 05/06/2022 through 05/11/2022 for acute respiratory failure with hypoxia secondary to pneumonia with possible component of new diastolic CHF.  Patient initially required supplemental oxygen but was weaned off prior to discharge.  She was noted to have right leg DVT and discharged on Eliquis.  She had minimal troponin elevation felt secondary to acute diastolic CHF with preserved EF 60 to 65% and grade 1 DD on echo.  Cardiology consulted and recommended continuing atenolol 50 mg daily as well as diuresis.  She was also discovered to have acute compression deformity of T9 as well as stable remote compression deformity of T7 and T12.  Outpatient neurosurgery follow-up arranged.  History of prior benign cardiac evaluation in 2020 at [facility] with normal stress echocardiogram.  Results not available in Care Everywhere, however, in cardiology note from Dr. Saralyn Pilar dated 08/28/2019 he states, "The patient underwent a stress echocardiogram on 06/03/2018. She exercised for 6:18 minutes on a Bruce protocol without chest pain or ECG changes. At baseline, 2D echocardiogram revealed normal left ventricular function with LVEF greater than 55%. At peak exercise, there was appropriate augmentation of all myocardial segments. There was mild aortic, mitral, and tricuspid valve insufficiency."  Last seen by hematology/oncology Dr. Rogue Bussing 07/20/2022.  She is noted to be maintained on daratumumab SQ treatments.  However, therapy currently on hold because of recent mission for COVID-pneumonia/deconditioning as well as vertebral compression fractures.  She is also noted to have some difficulty swallowing.  EGD on 07/17/2022  showed a motility issue as there was a pill noted in the esophagus as well as food still in the stomach.  Patient was advised by Dr. Rogue Bussing to hold Eliquis 3 days prior to surgery and restart 2 days after.  CMP and CBC from 07/20/2022 reviewed, creatinine mildly elevated 1.39 consistent with history of CKD, mild hyponatremia sodium 133, moderate anemia with hemoglobin 9.3 (appears near baseline).  EKG 05/06/2022: NSR.  Rate 82.  Moderate voltage criteria for LVH, may be normal variant.  Prolonged QT (QTcB 500).  No significant change.  TTE 05/07/2022:  1. Left ventricular ejection fraction, by estimation, is 60 to 65%. The  left ventricle has normal function. The left ventricle has no regional  wall motion abnormalities. Left ventricular diastolic parameters are  consistent with Grade I diastolic  dysfunction (impaired relaxation).   2. Right ventricular systolic function is normal. The right ventricular  size is normal.   3. The mitral valve is normal in structure. Mild mitral valve  regurgitation. No evidence of mitral stenosis.   4. Tricuspid valve regurgitation is mild to moderate.   5. The aortic valve is normal in structure. Aortic valve regurgitation is  mild to moderate. No aortic stenosis is present.   6. The inferior vena cava is normal in size with greater than 50%  respiratory variability, suggesting right atrial pressure of 3 mmHg.    )         Anesthesia Quick Evaluation

## 2022-07-29 ENCOUNTER — Other Ambulatory Visit: Payer: Self-pay

## 2022-07-29 ENCOUNTER — Ambulatory Visit (HOSPITAL_COMMUNITY): Payer: Medicare Other

## 2022-07-29 ENCOUNTER — Ambulatory Visit (HOSPITAL_COMMUNITY)
Admission: RE | Admit: 2022-07-29 | Discharge: 2022-07-30 | Disposition: A | Discharge status: Home / Self Care | Payer: Medicare Other | Attending: Neurosurgery | Admitting: Neurosurgery

## 2022-07-29 ENCOUNTER — Encounter (HOSPITAL_COMMUNITY)
Admission: RE | Disposition: A | Discharge status: Home / Self Care | Payer: Self-pay | Source: Home / Self Care | Attending: Neurosurgery

## 2022-07-29 ENCOUNTER — Ambulatory Visit (HOSPITAL_COMMUNITY): Payer: Medicare Other | Admitting: Physician Assistant

## 2022-07-29 ENCOUNTER — Ambulatory Visit (HOSPITAL_BASED_OUTPATIENT_CLINIC_OR_DEPARTMENT_OTHER): Payer: Medicare Other | Admitting: Anesthesiology

## 2022-07-29 ENCOUNTER — Encounter (HOSPITAL_COMMUNITY): Payer: Self-pay | Admitting: Neurosurgery

## 2022-07-29 DIAGNOSIS — C9000 Multiple myeloma not having achieved remission: Secondary | ICD-10-CM | POA: Diagnosis not present

## 2022-07-29 DIAGNOSIS — M4854XA Collapsed vertebra, not elsewhere classified, thoracic region, initial encounter for fracture: Secondary | ICD-10-CM | POA: Diagnosis present

## 2022-07-29 DIAGNOSIS — E039 Hypothyroidism, unspecified: Secondary | ICD-10-CM | POA: Insufficient documentation

## 2022-07-29 DIAGNOSIS — N189 Chronic kidney disease, unspecified: Secondary | ICD-10-CM

## 2022-07-29 DIAGNOSIS — D631 Anemia in chronic kidney disease: Secondary | ICD-10-CM

## 2022-07-29 DIAGNOSIS — F418 Other specified anxiety disorders: Secondary | ICD-10-CM | POA: Diagnosis not present

## 2022-07-29 DIAGNOSIS — D638 Anemia in other chronic diseases classified elsewhere: Secondary | ICD-10-CM

## 2022-07-29 DIAGNOSIS — M8448XG Pathological fracture, other site, subsequent encounter for fracture with delayed healing: Secondary | ICD-10-CM

## 2022-07-29 DIAGNOSIS — S22000G Wedge compression fracture of unspecified thoracic vertebra, subsequent encounter for fracture with delayed healing: Secondary | ICD-10-CM

## 2022-07-29 DIAGNOSIS — M8008XA Age-related osteoporosis with current pathological fracture, vertebra(e), initial encounter for fracture: Secondary | ICD-10-CM | POA: Insufficient documentation

## 2022-07-29 DIAGNOSIS — M4624 Osteomyelitis of vertebra, thoracic region: Secondary | ICD-10-CM | POA: Diagnosis not present

## 2022-07-29 DIAGNOSIS — M109 Gout, unspecified: Secondary | ICD-10-CM | POA: Diagnosis not present

## 2022-07-29 DIAGNOSIS — K219 Gastro-esophageal reflux disease without esophagitis: Secondary | ICD-10-CM | POA: Insufficient documentation

## 2022-07-29 DIAGNOSIS — M1990 Unspecified osteoarthritis, unspecified site: Secondary | ICD-10-CM | POA: Insufficient documentation

## 2022-07-29 HISTORY — PX: KYPHOPLASTY: SHX5884

## 2022-07-29 SURGERY — KYPHOPLASTY
Anesthesia: General

## 2022-07-29 MED ORDER — CHLORHEXIDINE GLUCONATE CLOTH 2 % EX PADS: 6.0000 | MEDICATED_PAD | Freq: Once | CUTANEOUS | Status: DC

## 2022-07-29 MED ORDER — HYDROXYCHLOROQUINE SULFATE 200 MG PO TABS
200.0000 mg | ORAL_TABLET | Freq: Two times a day (BID) | ORAL | Status: DC
  Administered 2022-07-29 – 2022-07-30 (×2): 200 mg via ORAL
  Filled 2022-07-29 (×3): qty 1

## 2022-07-29 MED ORDER — DEXAMETHASONE SODIUM PHOSPHATE 10 MG/ML IJ SOLN
INTRAMUSCULAR | Status: DC | PRN
  Administered 2022-07-29: 5 mg via INTRAVENOUS

## 2022-07-29 MED ORDER — SUCCINYLCHOLINE CHLORIDE 200 MG/10ML IV SOSY
PREFILLED_SYRINGE | INTRAVENOUS | Status: DC | PRN
  Administered 2022-07-29: 100 mg via INTRAVENOUS

## 2022-07-29 MED ORDER — LEVOTHYROXINE SODIUM 112 MCG PO TABS
112.0000 ug | ORAL_TABLET | Freq: Every day | ORAL | Status: DC
  Administered 2022-07-30: 112 ug via ORAL
  Filled 2022-07-29: qty 1

## 2022-07-29 MED ORDER — POTASSIUM 99 MG PO TABS: 1.0000 | ORAL_TABLET | Freq: Every day | ORAL | Status: DC

## 2022-07-29 MED ORDER — LACTATED RINGERS IV SOLN: INTRAVENOUS | Status: DC

## 2022-07-29 MED ORDER — BUPIVACAINE-EPINEPHRINE (PF) 0.25% -1:200000 IJ SOLN
INTRAMUSCULAR | Status: AC
  Filled 2022-07-29: qty 30

## 2022-07-29 MED ORDER — ACETAMINOPHEN 650 MG RE SUPP: 650.0000 mg | RECTAL | Status: DC | PRN

## 2022-07-29 MED ORDER — CEFAZOLIN SODIUM-DEXTROSE 2-4 GM/100ML-% IV SOLN
2.0000 g | Freq: Three times a day (TID) | INTRAVENOUS | Status: AC
  Administered 2022-07-30 (×2): 2 g via INTRAVENOUS
  Filled 2022-07-29 (×2): qty 100

## 2022-07-29 MED ORDER — PROPOFOL 10 MG/ML IV BOLUS
INTRAVENOUS | Status: DC | PRN
  Administered 2022-07-29: 80 mg via INTRAVENOUS

## 2022-07-29 MED ORDER — OXYCODONE HCL 5 MG PO TABS: 10.0000 mg | ORAL_TABLET | ORAL | Status: DC | PRN

## 2022-07-29 MED ORDER — ONDANSETRON HCL 4 MG PO TABS: 4.0000 mg | ORAL_TABLET | Freq: Four times a day (QID) | ORAL | Status: DC | PRN

## 2022-07-29 MED ORDER — LIDOCAINE 2% (20 MG/ML) 5 ML SYRINGE
INTRAMUSCULAR | Status: DC | PRN
  Administered 2022-07-29: 60 mg via INTRAVENOUS

## 2022-07-29 MED ORDER — MORPHINE SULFATE (PF) 4 MG/ML IV SOLN: 4.0000 mg | INTRAVENOUS | Status: DC | PRN

## 2022-07-29 MED ORDER — FENTANYL CITRATE (PF) 250 MCG/5ML IJ SOLN
INTRAMUSCULAR | Status: AC
  Filled 2022-07-29: qty 5

## 2022-07-29 MED ORDER — ROPINIROLE HCL 1 MG PO TABS
0.5000 mg | ORAL_TABLET | Freq: Every day | ORAL | Status: DC
  Administered 2022-07-29: 0.5 mg via ORAL
  Filled 2022-07-29: qty 1

## 2022-07-29 MED ORDER — CHLORHEXIDINE GLUCONATE 0.12 % MT SOLN
15.0000 mL | OROMUCOSAL | Status: AC
  Administered 2022-07-29: 15 mL via OROMUCOSAL
  Filled 2022-07-29: qty 15

## 2022-07-29 MED ORDER — TIZANIDINE HCL 4 MG PO TABS
2.0000 mg | ORAL_TABLET | Freq: Two times a day (BID) | ORAL | Status: DC
  Administered 2022-07-29 – 2022-07-30 (×2): 2 mg via ORAL
  Filled 2022-07-29 (×2): qty 1

## 2022-07-29 MED ORDER — SODIUM CHLORIDE 0.9 % IV SOLN: 250.0000 mL | INTRAVENOUS | Status: DC

## 2022-07-29 MED ORDER — ATENOLOL 25 MG PO TABS
25.0000 mg | ORAL_TABLET | Freq: Every day | ORAL | Status: DC
  Administered 2022-07-30: 25 mg via ORAL
  Filled 2022-07-29: qty 1

## 2022-07-29 MED ORDER — CYCLOBENZAPRINE HCL 10 MG PO TABS: 10.0000 mg | ORAL_TABLET | Freq: Three times a day (TID) | ORAL | Status: DC | PRN

## 2022-07-29 MED ORDER — ALLOPURINOL 300 MG PO TABS
300.0000 mg | ORAL_TABLET | Freq: Every day | ORAL | Status: DC
  Administered 2022-07-30: 300 mg via ORAL
  Filled 2022-07-29: qty 1

## 2022-07-29 MED ORDER — OXYCODONE HCL 5 MG PO TABS
5.0000 mg | ORAL_TABLET | ORAL | Status: DC | PRN
  Administered 2022-07-29: 5 mg via ORAL
  Filled 2022-07-29: qty 1

## 2022-07-29 MED ORDER — DOCUSATE SODIUM 100 MG PO CAPS
100.0000 mg | ORAL_CAPSULE | Freq: Two times a day (BID) | ORAL | Status: DC
  Administered 2022-07-29 – 2022-07-30 (×2): 100 mg via ORAL
  Filled 2022-07-29 (×2): qty 1

## 2022-07-29 MED ORDER — BACITRACIN ZINC 500 UNIT/GM EX OINT
TOPICAL_OINTMENT | CUTANEOUS | Status: DC | PRN
  Administered 2022-07-29: 1 via TOPICAL

## 2022-07-29 MED ORDER — FENTANYL CITRATE (PF) 250 MCG/5ML IJ SOLN
INTRAMUSCULAR | Status: DC | PRN
  Administered 2022-07-29: 50 ug via INTRAVENOUS

## 2022-07-29 MED ORDER — PANTOPRAZOLE SODIUM 40 MG PO TBEC
40.0000 mg | DELAYED_RELEASE_TABLET | Freq: Every day | ORAL | Status: DC
  Administered 2022-07-30: 40 mg via ORAL
  Filled 2022-07-29: qty 1

## 2022-07-29 MED ORDER — ONDANSETRON HCL 4 MG/2ML IJ SOLN: 4.0000 mg | Freq: Four times a day (QID) | INTRAMUSCULAR | Status: DC | PRN

## 2022-07-29 MED ORDER — ZOLPIDEM TARTRATE 5 MG PO TABS: 5.0000 mg | ORAL_TABLET | Freq: Every evening | ORAL | Status: DC | PRN

## 2022-07-29 MED ORDER — 0.9 % SODIUM CHLORIDE (POUR BTL) OPTIME
TOPICAL | Status: DC | PRN
  Administered 2022-07-29: 1000 mL

## 2022-07-29 MED ORDER — PHENOL 1.4 % MT LIQD: 1.0000 | OROMUCOSAL | Status: DC | PRN

## 2022-07-29 MED ORDER — BACITRACIN ZINC 500 UNIT/GM EX OINT
TOPICAL_OINTMENT | CUTANEOUS | Status: AC
  Filled 2022-07-29: qty 28.35

## 2022-07-29 MED ORDER — MENTHOL 3 MG MT LOZG: 1.0000 | LOZENGE | OROMUCOSAL | Status: DC | PRN

## 2022-07-29 MED ORDER — ACETAMINOPHEN 325 MG PO TABS: 650.0000 mg | ORAL_TABLET | ORAL | Status: DC | PRN

## 2022-07-29 MED ORDER — MIRABEGRON ER 50 MG PO TB24
50.0000 mg | ORAL_TABLET | Freq: Every day | ORAL | Status: DC
  Administered 2022-07-30: 50 mg via ORAL
  Filled 2022-07-29: qty 1

## 2022-07-29 MED ORDER — SODIUM CHLORIDE 0.9% FLUSH: 3.0000 mL | Freq: Two times a day (BID) | INTRAVENOUS | Status: DC

## 2022-07-29 MED ORDER — SODIUM CHLORIDE 0.9% FLUSH: 3.0000 mL | INTRAVENOUS | Status: DC | PRN

## 2022-07-29 MED ORDER — ACETAMINOPHEN 500 MG PO TABS
1000.0000 mg | ORAL_TABLET | Freq: Four times a day (QID) | ORAL | Status: DC
  Administered 2022-07-29 – 2022-07-30 (×2): 1000 mg via ORAL
  Filled 2022-07-29 (×2): qty 2

## 2022-07-29 MED ORDER — OXYBUTYNIN CHLORIDE ER 10 MG PO TB24
10.0000 mg | ORAL_TABLET | Freq: Every day | ORAL | Status: DC
  Administered 2022-07-30: 10 mg via ORAL
  Filled 2022-07-29: qty 1

## 2022-07-29 MED ORDER — IOPAMIDOL (ISOVUE-300) INJECTION 61%
INTRAVENOUS | Status: DC | PRN
  Administered 2022-07-29: 300 mL

## 2022-07-29 MED ORDER — DULOXETINE HCL 20 MG PO CPEP
20.0000 mg | ORAL_CAPSULE | Freq: Two times a day (BID) | ORAL | Status: DC
  Administered 2022-07-29 – 2022-07-30 (×2): 20 mg via ORAL
  Filled 2022-07-29 (×3): qty 1

## 2022-07-29 MED ORDER — HYDROCODONE-ACETAMINOPHEN 5-325 MG PO TABS: 1.0000 | ORAL_TABLET | ORAL | Status: DC | PRN

## 2022-07-29 MED ORDER — ALBUTEROL SULFATE HFA 108 (90 BASE) MCG/ACT IN AERS: 2.0000 | INHALATION_SPRAY | RESPIRATORY_TRACT | Status: DC | PRN

## 2022-07-29 MED ORDER — BUPIVACAINE-EPINEPHRINE 0.5% -1:200000 IJ SOLN
INTRAMUSCULAR | Status: DC | PRN
  Administered 2022-07-29: 10 mL

## 2022-07-29 MED ORDER — OLANZAPINE 10 MG PO TABS
10.0000 mg | ORAL_TABLET | Freq: Every day | ORAL | Status: DC
  Administered 2022-07-29: 10 mg via ORAL
  Filled 2022-07-29 (×2): qty 1

## 2022-07-29 MED ORDER — CEFAZOLIN SODIUM-DEXTROSE 2-4 GM/100ML-% IV SOLN
2.0000 g | INTRAVENOUS | Status: AC
  Administered 2022-07-29: 2 g via INTRAVENOUS
  Filled 2022-07-29: qty 100

## 2022-07-29 MED ORDER — BISACODYL 10 MG RE SUPP: 10.0000 mg | Freq: Every day | RECTAL | Status: DC | PRN

## 2022-07-29 SURGICAL SUPPLY — 48 items
APL SKNCLS STERI-STRIP NONHPOA (GAUZE/BANDAGES/DRESSINGS) ×1
BAG COUNTER SPONGE SURGICOUNT (BAG) ×1 IMPLANT
BAG SPNG CNTER NS LX DISP (BAG) ×1
BENZOIN TINCTURE PRP APPL 2/3 (GAUZE/BANDAGES/DRESSINGS) ×1 IMPLANT
BLADE CLIPPER SURG (BLADE) IMPLANT
BLADE SURG 15 STRL LF DISP TIS (BLADE) ×1 IMPLANT
BLADE SURG 15 STRL SS (BLADE) ×1
CEMENT KYPHON C01A KIT/MIXER (Cement) IMPLANT
DEVICE BIOPSY BONE KYPHX (INSTRUMENTS) IMPLANT
DRAPE C-ARM 42X72 X-RAY (DRAPES) ×1 IMPLANT
DRAPE HALF SHEET 40X57 (DRAPES) ×1 IMPLANT
DRAPE INCISE IOBAN 66X45 STRL (DRAPES) ×1 IMPLANT
DRAPE LAPAROTOMY 100X72X124 (DRAPES) ×1 IMPLANT
DRAPE SURG 17X23 STRL (DRAPES) ×4 IMPLANT
DRAPE WARM FLUID 44X44 (DRAPES) ×1 IMPLANT
DRSG OPSITE POSTOP 4X6 (GAUZE/BANDAGES/DRESSINGS) IMPLANT
DRSG TELFA 3X8 NADH STRL (GAUZE/BANDAGES/DRESSINGS) IMPLANT
GAUZE 4X4 16PLY ~~LOC~~+RFID DBL (SPONGE) ×1 IMPLANT
GAUZE SPONGE 4X4 12PLY STRL (GAUZE/BANDAGES/DRESSINGS) ×1 IMPLANT
GLOVE BIO SURGEON STRL SZ8 (GLOVE) ×1 IMPLANT
GLOVE BIO SURGEON STRL SZ8.5 (GLOVE) ×1 IMPLANT
GLOVE EXAM NITRILE XL STR (GLOVE) IMPLANT
GOWN STRL REUS W/ TWL LRG LVL3 (GOWN DISPOSABLE) IMPLANT
GOWN STRL REUS W/ TWL XL LVL3 (GOWN DISPOSABLE) IMPLANT
GOWN STRL REUS W/TWL LRG LVL3 (GOWN DISPOSABLE)
GOWN STRL REUS W/TWL XL LVL3 (GOWN DISPOSABLE)
INTRODUCER DEVICE OSTEO LEVEL (INTRODUCER) IMPLANT
KIT BASIN OR (CUSTOM PROCEDURE TRAY) ×1 IMPLANT
KIT POSITION SURG JACKSON T1 (MISCELLANEOUS) IMPLANT
KIT TURNOVER KIT B (KITS) ×1 IMPLANT
NDL HYPO 22X1.5 SAFETY MO (MISCELLANEOUS) IMPLANT
NDL SPNL 22GX3.5 QUINCKE BK (NEEDLE) IMPLANT
NEEDLE HYPO 22GX1.5 SAFETY (NEEDLE) ×1 IMPLANT
NEEDLE HYPO 22X1.5 SAFETY MO (MISCELLANEOUS) ×1 IMPLANT
NEEDLE SPNL 22GX3.5 QUINCKE BK (NEEDLE) ×1 IMPLANT
NS IRRIG 1000ML POUR BTL (IV SOLUTION) ×1 IMPLANT
PACK EENT II TURBAN DRAPE (CUSTOM PROCEDURE TRAY) ×1 IMPLANT
PAD ARMBOARD 7.5X6 YLW CONV (MISCELLANEOUS) ×3 IMPLANT
SOL ELECTROSURG ANTI STICK (MISCELLANEOUS) ×1
SOLUTION ELECTROSURG ANTI STCK (MISCELLANEOUS) ×1 IMPLANT
SPECIMEN JAR SMALL (MISCELLANEOUS) IMPLANT
STRIP CLOSURE SKIN 1/2X4 (GAUZE/BANDAGES/DRESSINGS) ×1 IMPLANT
SUT VIC AB 3-0 SH 8-18 (SUTURE) ×1 IMPLANT
SYR CONTROL 10ML LL (SYRINGE) ×1 IMPLANT
TAMP BONE INFLATABLE 10/3 (BALLOONS) IMPLANT
TOWEL GREEN STERILE (TOWEL DISPOSABLE) ×1 IMPLANT
TOWEL GREEN STERILE FF (TOWEL DISPOSABLE) ×1 IMPLANT
TRAY KYPHOPAK 15/3 ONESTEP 1ST (MISCELLANEOUS) IMPLANT

## 2022-07-29 NOTE — Anesthesia Procedure Notes (Signed)
Procedure Name: Intubation Date/Time: 07/29/2022 5:09 PM  Performed by: Elvin So, CRNAPre-anesthesia Checklist: Patient identified, Emergency Drugs available, Suction available and Patient being monitored Patient Re-evaluated:Patient Re-evaluated prior to induction Oxygen Delivery Method: Circle System Utilized Preoxygenation: Pre-oxygenation with 100% oxygen Induction Type: IV induction Ventilation: Mask ventilation without difficulty Laryngoscope Size: Mac and 3 Grade View: Grade I Tube type: Oral Tube size: 7.0 mm Number of attempts: 1 Airway Equipment and Method: Stylet and Oral airway Placement Confirmation: ETT inserted through vocal cords under direct vision, positive ETCO2 and breath sounds checked- equal and bilateral Secured at: 21 cm Tube secured with: Tape Dental Injury: Teeth and Oropharynx as per pre-operative assessment

## 2022-07-29 NOTE — Transfer of Care (Signed)
Immediate Anesthesia Transfer of Care Note  Patient: KIMARI NOUR  Procedure(s) Performed: KYPHOPLASTY THORACIC EIGHT, THORACIC NINE, THORACIC TEN  Patient Location: PACU  Anesthesia Type:General  Level of Consciousness: awake and patient cooperative  Airway & Oxygen Therapy: Patient Spontanous Breathing and Patient connected to face mask oxygen  Post-op Assessment: Report given to RN, Post -op Vital signs reviewed and stable, and Patient moving all extremities  Post vital signs: Reviewed and stable  Last Vitals:  Vitals Value Taken Time  BP 157/69 07/29/22 1847  Temp    Pulse 69 07/29/22 1847  Resp    SpO2 98 % 07/29/22 1847  Vitals shown include unvalidated device data.  Last Pain:  Vitals:   07/29/22 1411  TempSrc:   PainSc: 8       Patients Stated Pain Goal: 5 ([id number])  Complications: No notable events documented.

## 2022-07-29 NOTE — H&P (Signed)
Subjective: The patient is an 82 year old white female with a history of myeloma who developed back pain.  She was diagnosed with thoracic compression fractures.  We discussed the various treatment options with her.  She has decided proceed with surgery.  Past Medical History:  Diagnosis Date   Acute respiratory failure with hypoxia ([place]) 05/07/2022   Allergic rhinitis    Allergy    Anemia    Anemia    Barrett's esophagus    Blood dyscrasia    multiple myloma remission   Cancer ([place])    Change in bowel habits    Compression fracture 2013   Degenerative disc disease, lumbar    Dysrhythmia    palpitations   Esophageal reflux    Family history of adverse reaction to anesthesia    nausea -mom   GERD (gastroesophageal reflux disease)    Gout    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[place])    Heart palpitations    Hyperlipidemia    Hypothyroidism    Inflammatory polyarthropathy (HCC)    Lumbago    Lumbar radiculitis    Lumbar stenosis with neurogenic claudication    Multiple myeloma (HCC)    Multiple myeloma (HCC)    Neuropathy    Bilateral Feet   Osteopenia    Osteoporosis    Other dysphagia    Palpitations    Pneumonia 04/2022   R/T COVID   Positive PPD    Renal insufficiency    Rheumatoid arthritis (HCC)    Rheumatoid arthritis (HCC)    Seasonal allergies    Tuberculosis 2011   Vertigo     Past Surgical History:  Procedure Laterality Date   ABDOMINAL HYSTERECTOMY     APPENDECTOMY     BACK SURGERY     BREAST EXCISIONAL BIOPSY Bilateral    neg   CATARACT EXTRACTION Bilateral    CHOLECYSTECTOMY     COLONOSCOPY WITH PROPOFOL N/A 11/12/2016   Procedure: COLONOSCOPY WITH PROPOFOL;  Surgeon: Manya Silvas, MD;  Location: [facility] ENDOSCOPY;  Service: Endoscopy;  Laterality: N/A;   ESOPHAGOGASTRODUODENOSCOPY (EGD) WITH PROPOFOL N/A 11/12/2016   Procedure: ESOPHAGOGASTRODUODENOSCOPY (EGD) WITH PROPOFOL;  Surgeon: Manya Silvas, MD;  Location: [facility] ENDOSCOPY;  Service:  Endoscopy;  Laterality: N/A;   ESOPHAGOGASTRODUODENOSCOPY (EGD) WITH PROPOFOL N/A 07/17/2022   Procedure: ESOPHAGOGASTRODUODENOSCOPY (EGD) WITH PROPOFOL;  Surgeon: Lesly Rubenstein, MD;  Location: [facility] ENDOSCOPY;  Service: Endoscopy;  Laterality: N/A;   FOOT SURGERY     LUMBAR LAMINECTOMY/DECOMPRESSION MICRODISCECTOMY Left 06/18/2016   Procedure: LAMINECTOMY FOR FACET/SYNOVIAL CYST LUMBAR THREE - LUMBAR FOUR LEFT;  Surgeon: Newman Pies, MD;  Location: [place];  Service: Neurosurgery;  Laterality: Left;  LAMINECTOMY FOR FACET/SYNOVIAL CYST LUMBAR THREE - LUMBAR FOUR LEFT    Allergies  Allergen Reactions   Isoniazid Other (See Comments)    Blisters     Social History   Tobacco Use   Smoking status: Never   Smokeless tobacco: Never  Substance Use Topics   Alcohol use: No    Family History  Problem Relation Age of Onset   Hypertension Mother    Heart attack Mother    Rheum arthritis Mother    Osteoarthritis Mother    Cancer Father    Hypertension Father    Stroke Father    Breast cancer Maternal Grandmother 40   Osteoarthritis Other    Rheum arthritis Other    Prior to Admission medications   Medication Sig Start Date End Date Taking? Authorizing Provider  allopurinol (ZYLOPRIM) 300 MG  tablet Take 300 mg by mouth daily. 09/14/14  Yes [provider]  apixaban (ELIQUIS) 5 MG TABS tablet Take 1 tablet (5 mg total) by mouth 2 (two) times daily. 06/04/22  Yes Cammie Sickle, MD  Ascorbic Acid (VITAMIN C) 1000 MG tablet Take 1,000 mg by mouth daily.   Yes [provider]  atenolol (TENORMIN) 50 MG tablet Take 0.5 tablets (25 mg total) by mouth daily. Patient taking differently: Take 50 mg by mouth daily. 09/05/18  Yes Ghimire, Henreitta Leber, MD  CALCIUM MAGNESIUM ZINC PO Take 1 tablet by mouth 2 (two) times daily.   Yes [provider]  Cholecalciferol (VITAMIN D-3) 125 MCG (5000 UT) TABS Take 5,000 Units by mouth daily.   Yes [provider]   DULoxetine (CYMBALTA) 20 MG capsule Take 20 mg by mouth 2 (two) times daily. 10/02/20  Yes [provider]  hydroxychloroquine (PLAQUENIL) 200 MG tablet Take 200 mg by mouth See admin instructions. Patient currently takes 200 mg twice daily Monday through Friday and 200 mg once daily Saturday and Sunday 10/25/18  Yes [provider]  lansoprazole (PREVACID) 30 MG capsule Take 1 capsule (30 mg total) by mouth 2 (two) times daily before a meal. 06/12/22  Yes Cammie Sickle, MD  levothyroxine (SYNTHROID) 112 MCG tablet Take 112 mcg by mouth daily before breakfast. 01/16/22  Yes [provider]  MYRBETRIQ 50 MG TB24 tablet Take 50 mg by mouth daily.   Yes [provider]  OLANZapine (ZYPREXA) 10 MG tablet Take 1 tablet (10 mg total) by mouth at bedtime. 07/20/22  Yes Cammie Sickle, MD  oxybutynin (DITROPAN-XL) 10 MG 24 hr tablet Take 1 tablet (10 mg total) by mouth daily. 10/21/20  Yes MacDiarmid, Nicki Reaper, MD  Potassium 99 MG TABS Take 1 tablet by mouth daily.   Yes [provider]  rOPINIRole (REQUIP) 0.5 MG tablet Take 0.5 mg by mouth at bedtime. 10/02/20  Yes [provider]  tiZANidine (ZANAFLEX) 2 MG tablet Take 2 mg by mouth 2 (two) times daily. 04/22/22  Yes [provider]  vitamin B-12 (CYANOCOBALAMIN) 1000 MCG tablet Take 1,000 mcg by mouth daily. 01/03/20  Yes [provider]  zinc gluconate 50 MG tablet Take 50 mg by mouth daily.   Yes [provider]  acyclovir (ZOVIRAX) 400 MG tablet Take 1 tablet (400 mg total) by mouth 2 (two) times daily. Patient not taking: Reported on 07/28/2022 04/10/22   Cammie Sickle, MD  albuterol (VENTOLIN HFA) 108 (90 Base) MCG/ACT inhaler Inhale 2 puffs into the lungs every 4 (four) hours as needed for wheezing or shortness of breath. Patient not taking: Reported on 06/19/2022 04/27/22   Sharen Hones, MD  ALPRAZolam Duanne Moron) 0.25 MG tablet TAKE 1 TABLET(0.25 MG) BY MOUTH AT  BEDTIME AS NEEDED FOR ANXIETY Patient not taking: Reported on 07/28/2022 04/24/22   Cammie Sickle, MD  benzonatate (TESSALON) 200 MG capsule Take 1 capsule (200 mg total) by mouth 3 (three) times daily as needed for cough. Patient not taking: Reported on 05/21/2022 04/24/22   Verlon Au, NP  chlorpheniramine-HYDROcodone (TUSSIONEX) 10-8 MG/5ML Take 5 mLs by mouth every 12 (twelve) hours as needed (for cough unrelieved by other medications). May cause drowsiness. Patient not taking: Reported on 06/19/2022 04/24/22   Verlon Au, NP  furosemide (LASIX) 40 MG tablet Take 1 tablet (40 mg total) by mouth daily. Patient not taking: Reported on 07/28/2022 05/11/22   Ezekiel Slocumb, DO  guaiFENesin (MUCINEX) 600 MG 12 hr tablet Take 1 tablet (600 mg total) by mouth 2 (two) times daily. Patient not taking: Reported on 05/21/2022 05/10/22   Nicole Kindred A, DO  HYDROcodone-acetaminophen (NORCO/VICODIN) 5-325 MG tablet Take 1 tablet by mouth every 8 (eight) hours as needed for moderate pain. Patient not taking: Reported on 07/28/2022 07/02/22   Cammie Sickle, MD  Ipratropium-Albuterol (COMBIVENT) 20-100 MCG/ACT AERS respimat Inhale 1 puff into the lungs every 6 (six) hours as needed for wheezing. Patient not taking: Reported on 06/19/2022 05/10/22   Nicole Kindred A, DO  lenalidomide (REVLIMID) 10 MG capsule Take 1 capsule (10 mg total) by mouth daily. Take for 21 days, then hold for 7 days. Repeat every 28 days. Patient not taking: Reported on 05/21/2022 05/19/22   Cammie Sickle, MD  Spacer/Aero-Holding Josiah Lobo DEVI 1 each by Does not apply route as needed. 05/10/22   Ezekiel Slocumb, DO     Review of Systems  Positive ROS: As above  All other systems have been reviewed and were otherwise negative with the exception of those mentioned in the HPI and as above.  Objective: Vital signs in last 24 hours: Temp:  [98.2 F (36.8 C)] 98.2 F (36.8 C) (03/20 1403) Pulse Rate:   [79] 79 (03/20 1403) Resp:  [18] 18 (03/20 1403) BP: (154)/(72) 154/72 (03/20 1403) SpO2:  [95 %] 95 % (03/20 1403) Weight:  [60.3 kg] 60.3 kg (03/20 1403) Estimated body mass index is 22.13 kg/m as calculated from the following:   Height as of this encounter: 5\' 5"  (1.651 m).   Weight as of this encounter: 60.3 kg.   General Appearance: Alert Head: Normocephalic, without obvious abnormality, atraumatic Eyes: PERRL, conjunctiva/corneas clear, EOM's intact,    Ears: Normal  Throat: Normal  Neck: Supple, Back: unremarkable Lungs: Clear to auscultation bilaterally, respirations unlabored Heart: Regular rate and rhythm, no murmur, rub or gallop Abdomen: Soft, non-tender Extremities: Extremities normal, atraumatic, no cyanosis or edema Skin: unremarkable  NEUROLOGIC:   Mental status: alert and oriented,Motor Exam - grossly normal Sensory Exam - grossly normal Reflexes:  Coordination - grossly normal Gait - grossly normal Balance - grossly normal Cranial Nerves: I: smell Not tested  II: visual acuity  OS: Normal  OD: Normal   II: visual fields Full to confrontation  II: pupils Equal, round, reactive to light  III,VII: ptosis None  III,IV,VI: extraocular muscles  Full ROM  V: mastication Normal  V: facial light touch sensation  Normal  V,VII: corneal reflex  Present  VII: facial muscle function - upper  Normal  VII: facial muscle function - lower Normal  VIII: hearing Not tested  IX: soft palate elevation  Normal  IX,X: gag reflex Present  XI: trapezius strength  5/5  XI: sternocleidomastoid strength 5/5  XI: neck flexion strength  5/5  XII: tongue strength  Normal    Data Review Lab Results  Component Value Date   WBC 12.3 (H) 07/20/2022   HGB 9.3 (L) 07/20/2022   HCT 31.0 (L) 07/20/2022   MCV 100.3 (H) 07/20/2022   PLT 427 (H) 07/20/2022   Lab Results  Component Value Date   NA 133 (L) 07/20/2022   K 4.0 07/20/2022   CL 98 07/20/2022   CO2 24 07/20/2022    BUN 15 07/20/2022   CREATININE 1.39 (H) 07/20/2022   GLUCOSE 110 (H) 07/20/2022   Lab Results  Component Value Date   INR 1.0 05/09/2022    Assessment/Plan: T8, T9  and T10 compression fracture, thoracic spine pain, multiple myeloma: I have discussed the situation with the patient.  We discussed the various treatment options including a T8, T9 and T10 kyphoplasty.  I described that surgery to her.  I have shown her surgical models.  I have given her a surgical pamphlet.  We have discussed the risk, benefits, alternatives, expected postop course, and likelihood of achieving our goals with surgery.  I have answered all her questions.  She has decided proceed with surgery.   Ophelia Charter 07/29/2022 4:11 PM

## 2022-07-29 NOTE — Op Note (Signed)
Brief history: The patient is an 82 year old white female with a history of myeloma who presented with T8, T9 and T10 compression fracture.  She failed medical management and was worked up with a thoracic MRI which demonstrated the fractures.  I discussed the various treatment options.  She has decided to proceed with kyphoplasty's.  Preop diagnosis: Pathologic thoracic compression fracture, thoracic spine pain, delayed healing  Postop diagnosis: The same  Procedure: T8, T9 and T10 kyphoplasty; left T10 vertebral body biopsy  Surgeon: Dr. Earle Gell  Assistant: None  Anesthesia: General tracheal  Estimated blood loss: 75 cc  Specimens: Left T10 vertebral body biopsy  Drains: None  Complications: None  Description of procedure: The patient was brought to the operating room by the anesthesia team.  General endotracheal anesthesia was induced.  She was turned to the prone position on the [place] table.  Her thoracolumbar region was then prepared with Betadine scrub and Betadine solution.  Sterile drapes were applied.  I injected the area to be incised with Marcaine with epinephrine solution.  I made incisions over the bilateral T8, T9 and T10 pedicles with a 15 blade scalpel.  Under biplanar fluoroscopy I cannulated the bilateral T8, T9 and T10 pedicles with a Jamshidi needle.  I attempted to biopsy T9 vertebral body but could not get a good specimen.  I obtained a right T10 vertebral body biopsy.  I then drilled through the cannula, removed the drill, inserted the balloon, inflated the balloon, deflated the balloon and injected cement into the T8, T9 and T10 vertebral bodies bilaterally via the pedicles under fluoroscopic guidance.  I then removed the cannulas.  I then reapproximated the patient's subcutaneous tissue with interrupted 3-0 Vicryl suture.  We reapproximated the skin with Steri-Strips and benzoin.  The wound was then coated with bacitracin ointment.  A sterile dressing was applied.   The drapes were removed.  By report all sponge, instrument, and needle counts were correct at the end of this case.

## 2022-07-30 ENCOUNTER — Encounter (HOSPITAL_COMMUNITY): Payer: Self-pay | Admitting: Neurosurgery

## 2022-07-30 DIAGNOSIS — M8008XA Age-related osteoporosis with current pathological fracture, vertebra(e), initial encounter for fracture: Secondary | ICD-10-CM | POA: Diagnosis not present

## 2022-07-30 MED ORDER — DOCUSATE SODIUM 100 MG PO CAPS: 100.0000 mg | ORAL_CAPSULE | Freq: Two times a day (BID) | ORAL | 0 refills | Status: AC

## 2022-07-30 MED ORDER — OXYCODONE-ACETAMINOPHEN 5-325 MG PO TABS: 1.0000 | ORAL_TABLET | ORAL | 0 refills | Status: DC | PRN

## 2022-07-30 MED ORDER — OXYCODONE-ACETAMINOPHEN 5-325 MG PO TABS: 1.0000 | ORAL_TABLET | ORAL | Status: DC | PRN

## 2022-07-30 NOTE — Evaluation (Signed)
Physical Therapy Evaluation  Patient Details Name: Sydney Gillespie MRN: [id number] DOB: 11-Mar-1941 Today's Date: 07/30/2022  History of Present Illness  Pt is an 82 y/o female who presents s/p T8-T10 kyphoplasty on 07/29/2022. PMH significant for CA, prior compression fx 2013, gout, bone marrow transplant, hypothyroidism, multiple myeloma, osteoporosis, RA, TB 2011, vertigo, lumbar lami 2018.   Clinical Impression  Pt admitted with above diagnosis. At the time of PT eval, pt was able to demonstrate transfers and ambulation with gross min guard assist and no AD. Pt was educated on precautions, positioning recommendations, appropriate activity progression, and car transfer. Pt currently with functional limitations due to the deficits listed below (see PT Problem List). Pt will benefit from skilled PT to increase their independence and safety with mobility to allow discharge to the venue listed below.         Recommendations for follow up therapy are one component of a multi-disciplinary discharge planning process, led by the attending physician.  Recommendations may be updated based on patient status, additional functional criteria and insurance authorization.  Follow Up Recommendations No PT follow up      Assistance Recommended at Discharge Intermittent Supervision/Assistance  Patient can return home with the following  A little help with walking and/or transfers;A little help with bathing/dressing/bathroom;Assistance with cooking/housework;Assist for transportation;Help with stairs or ramp for entrance    Equipment Recommendations None recommended by PT  Recommendations for Other Services       Functional Status Assessment Patient has had a recent decline in their functional status and demonstrates the ability to make significant improvements in function in a reasonable and predictable amount of time.     Precautions / Restrictions Precautions Precautions: Fall;Back Precaution  Booklet Issued: Yes (comment) Precaution Comments: Reviewed handout and pt was cued for precautions during functional mobility. Poor carryover Restrictions Weight Bearing Restrictions: No      Mobility  Bed Mobility Overal bed mobility: Needs Assistance Bed Mobility: Rolling, Sidelying to Sit Rolling: Supervision Sidelying to sit: Supervision       General bed mobility comments: Increased time to transition fully to EOB. HOB flat and rails lowered to simulate home environment.    Transfers Overall transfer level: Needs assistance Equipment used: None Transfers: Sit to/from Stand Sit to Stand: Min guard           General transfer comment: Close guard for safety as pt powered up to full stand. Pt appears mildly unsteady initially however able to recover without assistance.    Ambulation/Gait Ambulation/Gait assistance: Min guard Gait Distance (Feet): 300 Feet Assistive device: None Gait Pattern/deviations: Step-through pattern, Decreased stride length, Drifts right/left Gait velocity: Decreased Gait velocity interpretation: 1.31 - 2.62 ft/sec, indicative of limited community ambulator   General Gait Details: Pt drifting R and L throughout gait training. States this is her baseline. No overt LOB noted. Pt took x2 standing rest breaks towards end of gait training and was dyspneic. Recovered quickly with rest.  Stairs Stairs: Yes Stairs assistance: Min guard Stair Management: Two rails, Step to pattern, Forwards Number of Stairs: 10 General stair comments: VC's for sequencing and general safety.  Wheelchair Mobility    Modified Rankin (Stroke Patients Only)       Balance Overall balance assessment: Needs assistance Sitting-balance support: Feet supported, No upper extremity supported Sitting balance-Leahy Scale: Fair     Standing balance support: No upper extremity supported, Reliant on assistive device for balance Standing balance-Leahy Scale: Fair  Pertinent Vitals/Pain Pain Assessment Pain Assessment: Faces Faces Pain Scale: Hurts little more Pain Location: Back Pain Descriptors / Indicators: Operative site guarding Pain Intervention(s): Limited activity within patient's tolerance, Monitored during session, Repositioned    Home Living Family/patient expects to be discharged to:: Private residence Living Arrangements: Other relatives Available Help at Discharge: Family;Available PRN/intermittently (granddaughter works during the days) Type of Home: House Home Access: Stairs to enter Entrance Stairs-Rails: Right;Left;Can reach both Technical brewer of Steps: 5   Home Layout: One level Home Equipment: Conservation officer, nature (2 wheels)      Prior Function Prior Level of Function : Independent/Modified Independent             Mobility Comments: Intermittently utilizing the RW for support.       Hand Dominance   Dominant Hand: Right    Extremity/Trunk Assessment   Upper Extremity Assessment Upper Extremity Assessment: Overall WFL for tasks assessed    Lower Extremity Assessment Lower Extremity Assessment: Generalized weakness    Cervical / Trunk Assessment Cervical / Trunk Assessment: Kyphotic;Back Surgery  Communication   Communication: No difficulties  Cognition Arousal/Alertness: Awake/alert Behavior During Therapy: WFL for tasks assessed/performed Overall Cognitive Status: Within Functional Limits for tasks assessed                                 General Comments: Poor carryover of precautions, requires increased cues        General Comments      Exercises     Assessment/Plan    PT Assessment Patient needs continued PT services  PT Problem List Decreased strength;Decreased activity tolerance;Decreased balance;Decreased mobility;Decreased knowledge of use of DME;Decreased safety awareness;Decreased knowledge of precautions;Pain       PT  Treatment Interventions DME instruction;Gait training;Stair training;Functional mobility training;Therapeutic activities;Therapeutic exercise;Balance training;Patient/family education    PT Goals (Current goals can be found in the Care Plan section)  Acute Rehab PT Goals Patient Stated Goal: Home today PT Goal Formulation: With patient Time For Goal Achievement: 08/06/22 Potential to Achieve Goals: Good    Frequency Min 5X/week     Co-evaluation               AM-PAC PT "6 Clicks" Mobility  Outcome Measure Help needed turning from your back to your side while in a flat bed without using bedrails?: A Little Help needed moving from lying on your back to sitting on the side of a flat bed without using bedrails?: A Little Help needed moving to and from a bed to a chair (including a wheelchair)?: A Little Help needed standing up from a chair using your arms (e.g., wheelchair or bedside chair)?: A Little Help needed to walk in hospital room?: A Little Help needed climbing 3-5 steps with a railing? : A Little 6 Click Score: 18    End of Session Equipment Utilized During Treatment: Gait belt Activity Tolerance: Patient tolerated treatment well Patient left: in chair;with call bell/phone within reach Nurse Communication: Mobility status PT Visit Diagnosis: Unsteadiness on feet (R26.81);Pain Pain - part of body:  (back)    Time: [id number] PT Time Calculation (min) (ACUTE ONLY): 31 min   Charges:   PT Evaluation $PT Eval Low Complexity: 1 Low PT Treatments $Gait Training: 8-22 mins        Rolinda Roan, PT, DPT Acute Rehabilitation Services Secure Chat Preferred Office: [phone]   Thelma Comp 07/30/2022, 2:13 PM

## 2022-07-30 NOTE — Discharge Summary (Signed)
Physician Discharge Summary  Patient ID: Sydney Gillespie MRN: [id number] DOB/AGE: [birth year] 82 y.o.  Admit date: 07/29/2022 Discharge date: 07/30/2022  Admission Diagnoses: Thoracic compression fracture, thoracic spine pain  Discharge Diagnoses: The same Principal Problem:   Thoracic compression fracture, with delayed healing, subsequent encounter   Discharged Condition: good  Hospital Course: I performed a T8, T9 and T10 kyphoplasty and vertebral body biopsy on the patient on 07/29/2022.  The surgery went well.  The patient's postoperative course was unremarkable.  Postoperative day #1 the patient requested discharge to home.  She was given verbal and written discharge instructions.  All questions were answered.  Her pathology was pending at the time of her discharge.  Consults: PT, care management Significant Diagnostic Studies: T10 vertebral body biopsy Treatments: T8, T9 and T10 kyphoplasty and T10 vertebral body biopsy Discharge Exam: Blood pressure (!) 149/77, pulse 78, temperature 98.4 F (36.9 C), temperature source Oral, resp. rate 16, height 5\' 5"  (1.651 m), weight 60.3 kg, SpO2 97 %. The patient is alert and pleasant.  She looks well.  Her strength is normal.  Disposition: Home  Discharge Instructions     Call MD for:  difficulty breathing, headache or visual disturbances   Complete by: As directed    Call MD for:  extreme fatigue   Complete by: As directed    Call MD for:  hives   Complete by: As directed    Call MD for:  persistant dizziness or light-headedness   Complete by: As directed    Call MD for:  persistant nausea and vomiting   Complete by: As directed    Call MD for:  redness, tenderness, or signs of infection (pain, swelling, redness, odor or green/yellow discharge around incision site)   Complete by: As directed    Call MD for:  severe uncontrolled pain   Complete by: As directed    Call MD for:  temperature >100.4   Complete by: As directed     Diet - low sodium heart healthy   Complete by: As directed    Discharge instructions   Complete by: As directed    Call [phone] for a followup appointment. Take a stool softener while you are using pain medications.   Driving Restrictions   Complete by: As directed    Do not drive for 2 weeks.   Increase activity slowly   Complete by: As directed    Lifting restrictions   Complete by: As directed    Do not lift more than 5 pounds. No excessive bending or twisting.   May shower / Bathe   Complete by: As directed    Remove the dressing for 3 days after surgery.  You may shower, but leave the incision alone.   Remove dressing in 48 hours   Complete by: As directed       Allergies as of 07/30/2022       Reactions   Isoniazid Other (See Comments)   Blisters         Medication List     STOP taking these medications    HYDROcodone-acetaminophen 5-325 MG tablet Commonly known as: NORCO/VICODIN       TAKE these medications    acyclovir 400 MG tablet Commonly known as: ZOVIRAX Take 1 tablet (400 mg total) by mouth 2 (two) times daily.   albuterol 108 (90 Base) MCG/ACT inhaler Commonly known as: VENTOLIN HFA Inhale 2 puffs into the lungs every 4 (four) hours as needed for wheezing or shortness  of breath.   allopurinol 300 MG tablet Commonly known as: ZYLOPRIM Take 300 mg by mouth daily.   ALPRAZolam 0.25 MG tablet Commonly known as: XANAX TAKE 1 TABLET(0.25 MG) BY MOUTH AT BEDTIME AS NEEDED FOR ANXIETY   apixaban 5 MG Tabs tablet Commonly known as: Eliquis Take 1 tablet (5 mg total) by mouth 2 (two) times daily.   atenolol 50 MG tablet Commonly known as: TENORMIN Take 0.5 tablets (25 mg total) by mouth daily. What changed: how much to take   benzonatate 200 MG capsule Commonly known as: TESSALON Take 1 capsule (200 mg total) by mouth 3 (three) times daily as needed for cough.   CALCIUM MAGNESIUM ZINC PO Take 1 tablet by mouth 2 (two) times daily.    chlorpheniramine-HYDROcodone 10-8 MG/5ML Commonly known as: TUSSIONEX Take 5 mLs by mouth every 12 (twelve) hours as needed (for cough unrelieved by other medications). May cause drowsiness.   cyanocobalamin 1000 MCG tablet Commonly known as: VITAMIN B12 Take 1,000 mcg by mouth daily.   docusate sodium 100 MG capsule Commonly known as: COLACE Take 1 capsule (100 mg total) by mouth 2 (two) times daily.   DULoxetine 20 MG capsule Commonly known as: CYMBALTA Take 20 mg by mouth 2 (two) times daily.   furosemide 40 MG tablet Commonly known as: LASIX Take 1 tablet (40 mg total) by mouth daily.   guaiFENesin 600 MG 12 hr tablet Commonly known as: MUCINEX Take 1 tablet (600 mg total) by mouth 2 (two) times daily.   hydroxychloroquine 200 MG tablet Commonly known as: PLAQUENIL Take 200 mg by mouth See admin instructions. Patient currently takes 200 mg twice daily Monday through Friday and 200 mg once daily Saturday and Sunday   Ipratropium-Albuterol 20-100 MCG/ACT Aers respimat Commonly known as: COMBIVENT Inhale 1 puff into the lungs every 6 (six) hours as needed for wheezing.   lansoprazole 30 MG capsule Commonly known as: PREVACID Take 1 capsule (30 mg total) by mouth 2 (two) times daily before a meal.   lenalidomide 10 MG capsule Commonly known as: REVLIMID Take 1 capsule (10 mg total) by mouth daily. Take for 21 days, then hold for 7 days. Repeat every 28 days.   levothyroxine 112 MCG tablet Commonly known as: SYNTHROID Take 112 mcg by mouth daily before breakfast.   Myrbetriq 50 MG Tb24 tablet Generic drug: mirabegron ER Take 50 mg by mouth daily.   OLANZapine 10 MG tablet Commonly known as: ZYPREXA Take 1 tablet (10 mg total) by mouth at bedtime.   oxybutynin 10 MG 24 hr tablet Commonly known as: DITROPAN-XL Take 1 tablet (10 mg total) by mouth daily.   oxyCODONE-acetaminophen 5-325 MG tablet Commonly known as: PERCOCET/ROXICET Take 1 tablet by mouth every 4  (four) hours as needed for moderate pain.   Potassium 99 MG Tabs Take 1 tablet by mouth daily.   rOPINIRole 0.5 MG tablet Commonly known as: REQUIP Take 0.5 mg by mouth at bedtime.   Spacer/Aero-Holding Dorise Bullion 1 each by Does not apply route as needed.   tiZANidine 2 MG tablet Commonly known as: ZANAFLEX Take 2 mg by mouth 2 (two) times daily.   vitamin C 1000 MG tablet Take 1,000 mg by mouth daily.   Vitamin D-3 125 MCG (5000 UT) Tabs Take 5,000 Units by mouth daily.   zinc gluconate 50 MG tablet Take 50 mg by mouth daily.         Signed: Ophelia Charter 07/30/2022, 11:26 AM

## 2022-07-30 NOTE — Plan of Care (Signed)
Pt doing well. Pt and family given D/C instructions with verbal understanding. Rx's were sent to the pharmacy by MD. Pt's incision is clean and with no sign of infection. Pt's IV was removed prior to D/C. Pt D/C'd home via wheelchair per MD order. Pt is stable @ D/C and has no other needs at this time. Holli Humbles, RN

## 2022-07-30 NOTE — Anesthesia Postprocedure Evaluation (Signed)
Anesthesia Post Note  Patient: Sydney Gillespie  Procedure(s) Performed: KYPHOPLASTY THORACIC EIGHT, THORACIC NINE, THORACIC TEN     Patient location during evaluation: PACU Anesthesia Type: General Level of consciousness: awake Pain management: pain level controlled Vital Signs Assessment: post-procedure vital signs reviewed and stable Respiratory status: spontaneous breathing, nonlabored ventilation and respiratory function stable Cardiovascular status: blood pressure returned to baseline and stable Postop Assessment: no apparent nausea or vomiting Anesthetic complications: no   No notable events documented.  Last Vitals:  Vitals:   07/29/22 1956 07/29/22 2331  BP: (!) 182/82 131/63  Pulse: 76 72  Resp: 18 18  Temp: 36.6 C 36.6 C  SpO2: 98% 92%    Last Pain:  Vitals:   07/29/22 2331  TempSrc: Oral  PainSc:                  Karyl Kinnier Marrisa Kimber

## 2022-08-03 ENCOUNTER — Other Ambulatory Visit: Payer: Self-pay | Admitting: Internal Medicine

## 2022-08-03 LAB — SURGICAL PATHOLOGY

## 2022-08-05 ENCOUNTER — Observation Stay: Payer: Medicare Other

## 2022-08-05 ENCOUNTER — Emergency Department: Payer: Medicare Other

## 2022-08-05 ENCOUNTER — Encounter: Payer: Self-pay | Admitting: Family Medicine

## 2022-08-05 ENCOUNTER — Other Ambulatory Visit: Payer: Self-pay

## 2022-08-05 ENCOUNTER — Inpatient Hospital Stay
Admission: EM | Admit: 2022-08-05 | Discharge: 2022-08-12 | DRG: 177 | Disposition: A | Discharge status: Home Health | Payer: Medicare Other | Attending: Internal Medicine | Admitting: Internal Medicine

## 2022-08-05 DIAGNOSIS — R7881 Bacteremia: Secondary | ICD-10-CM | POA: Diagnosis present

## 2022-08-05 DIAGNOSIS — Z8611 Personal history of tuberculosis: Secondary | ICD-10-CM

## 2022-08-05 DIAGNOSIS — K219 Gastro-esophageal reflux disease without esophagitis: Secondary | ICD-10-CM | POA: Diagnosis present

## 2022-08-05 DIAGNOSIS — M4620 Osteomyelitis of vertebra, site unspecified: Secondary | ICD-10-CM

## 2022-08-05 DIAGNOSIS — Z9049 Acquired absence of other specified parts of digestive tract: Secondary | ICD-10-CM

## 2022-08-05 DIAGNOSIS — Z90722 Acquired absence of ovaries, bilateral: Secondary | ICD-10-CM

## 2022-08-05 DIAGNOSIS — I824Y9 Acute embolism and thrombosis of unspecified deep veins of unspecified proximal lower extremity: Secondary | ICD-10-CM

## 2022-08-05 DIAGNOSIS — M109 Gout, unspecified: Secondary | ICD-10-CM | POA: Diagnosis present

## 2022-08-05 DIAGNOSIS — J690 Pneumonitis due to inhalation of food and vomit: Principal | ICD-10-CM | POA: Diagnosis present

## 2022-08-05 DIAGNOSIS — Z9079 Acquired absence of other genital organ(s): Secondary | ICD-10-CM

## 2022-08-05 DIAGNOSIS — Z1152 Encounter for screening for COVID-19: Secondary | ICD-10-CM

## 2022-08-05 DIAGNOSIS — I2510 Atherosclerotic heart disease of native coronary artery without angina pectoris: Secondary | ICD-10-CM | POA: Diagnosis present

## 2022-08-05 DIAGNOSIS — K2270 Barrett's esophagus without dysplasia: Secondary | ICD-10-CM | POA: Diagnosis present

## 2022-08-05 DIAGNOSIS — B9561 Methicillin susceptible Staphylococcus aureus infection as the cause of diseases classified elsewhere: Secondary | ICD-10-CM | POA: Diagnosis present

## 2022-08-05 DIAGNOSIS — Z8261 Family history of arthritis: Secondary | ICD-10-CM

## 2022-08-05 DIAGNOSIS — J9601 Acute respiratory failure with hypoxia: Secondary | ICD-10-CM | POA: Diagnosis present

## 2022-08-05 DIAGNOSIS — D631 Anemia in chronic kidney disease: Secondary | ICD-10-CM | POA: Diagnosis present

## 2022-08-05 DIAGNOSIS — E039 Hypothyroidism, unspecified: Secondary | ICD-10-CM | POA: Diagnosis present

## 2022-08-05 DIAGNOSIS — R0603 Acute respiratory distress: Secondary | ICD-10-CM

## 2022-08-05 DIAGNOSIS — J309 Allergic rhinitis, unspecified: Secondary | ICD-10-CM | POA: Diagnosis present

## 2022-08-05 DIAGNOSIS — M069 Rheumatoid arthritis, unspecified: Secondary | ICD-10-CM | POA: Diagnosis present

## 2022-08-05 DIAGNOSIS — Z86718 Personal history of other venous thrombosis and embolism: Secondary | ICD-10-CM

## 2022-08-05 DIAGNOSIS — S22000G Wedge compression fracture of unspecified thoracic vertebra, subsequent encounter for fracture with delayed healing: Secondary | ICD-10-CM

## 2022-08-05 DIAGNOSIS — Z90710 Acquired absence of both cervix and uterus: Secondary | ICD-10-CM

## 2022-08-05 DIAGNOSIS — I129 Hypertensive chronic kidney disease with stage 1 through stage 4 chronic kidney disease, or unspecified chronic kidney disease: Secondary | ICD-10-CM | POA: Diagnosis present

## 2022-08-05 DIAGNOSIS — M4854XG Collapsed vertebra, not elsewhere classified, thoracic region, subsequent encounter for fracture with delayed healing: Secondary | ICD-10-CM | POA: Diagnosis present

## 2022-08-05 DIAGNOSIS — R0602 Shortness of breath: Secondary | ICD-10-CM

## 2022-08-05 DIAGNOSIS — Z79899 Other long term (current) drug therapy: Secondary | ICD-10-CM

## 2022-08-05 DIAGNOSIS — M4854XA Collapsed vertebra, not elsewhere classified, thoracic region, initial encounter for fracture: Secondary | ICD-10-CM | POA: Diagnosis present

## 2022-08-05 DIAGNOSIS — R109 Unspecified abdominal pain: Secondary | ICD-10-CM | POA: Diagnosis present

## 2022-08-05 DIAGNOSIS — I82461 Acute embolism and thrombosis of right calf muscular vein: Secondary | ICD-10-CM | POA: Diagnosis present

## 2022-08-05 DIAGNOSIS — Z888 Allergy status to other drugs, medicaments and biological substances status: Secondary | ICD-10-CM

## 2022-08-05 DIAGNOSIS — D509 Iron deficiency anemia, unspecified: Secondary | ICD-10-CM | POA: Diagnosis present

## 2022-08-05 DIAGNOSIS — M810 Age-related osteoporosis without current pathological fracture: Secondary | ICD-10-CM | POA: Diagnosis present

## 2022-08-05 DIAGNOSIS — J189 Pneumonia, unspecified organism: Secondary | ICD-10-CM | POA: Diagnosis present

## 2022-08-05 DIAGNOSIS — Z8249 Family history of ischemic heart disease and other diseases of the circulatory system: Secondary | ICD-10-CM

## 2022-08-05 DIAGNOSIS — N184 Chronic kidney disease, stage 4 (severe): Secondary | ICD-10-CM | POA: Diagnosis present

## 2022-08-05 DIAGNOSIS — R1319 Other dysphagia: Secondary | ICD-10-CM | POA: Diagnosis present

## 2022-08-05 DIAGNOSIS — Z9484 Stem cells transplant status: Secondary | ICD-10-CM

## 2022-08-05 DIAGNOSIS — M4644 Discitis, unspecified, thoracic region: Secondary | ICD-10-CM | POA: Diagnosis present

## 2022-08-05 DIAGNOSIS — Z8579 Personal history of other malignant neoplasms of lymphoid, hematopoietic and related tissues: Secondary | ICD-10-CM

## 2022-08-05 DIAGNOSIS — Z8701 Personal history of pneumonia (recurrent): Secondary | ICD-10-CM

## 2022-08-05 DIAGNOSIS — Z9889 Other specified postprocedural states: Secondary | ICD-10-CM

## 2022-08-05 DIAGNOSIS — N1832 Chronic kidney disease, stage 3b: Secondary | ICD-10-CM | POA: Diagnosis present

## 2022-08-05 DIAGNOSIS — Z7901 Long term (current) use of anticoagulants: Secondary | ICD-10-CM

## 2022-08-05 DIAGNOSIS — Z8616 Personal history of COVID-19: Secondary | ICD-10-CM

## 2022-08-05 DIAGNOSIS — I700 Atherosclerosis of aorta: Secondary | ICD-10-CM | POA: Diagnosis present

## 2022-08-05 DIAGNOSIS — M4624 Osteomyelitis of vertebra, thoracic region: Secondary | ICD-10-CM | POA: Diagnosis present

## 2022-08-05 DIAGNOSIS — E785 Hyperlipidemia, unspecified: Secondary | ICD-10-CM | POA: Diagnosis present

## 2022-08-05 LAB — CBC
HCT: 30.7 % — ABNORMAL LOW (ref 36.0–46.0)
Hemoglobin: 9.1 g/dL — ABNORMAL LOW (ref 12.0–15.0)
MCH: 29.7 pg (ref 26.0–34.0)
MCHC: 29.6 g/dL — ABNORMAL LOW (ref 30.0–36.0)
MCV: 100.3 fL — ABNORMAL HIGH (ref 80.0–100.0)
Platelets: 452 10*3/uL — ABNORMAL HIGH (ref 150–400)
RBC: 3.06 MIL/uL — ABNORMAL LOW (ref 3.87–5.11)
RDW: 16.7 % — ABNORMAL HIGH (ref 11.5–15.5)
WBC: 12.9 10*3/uL — ABNORMAL HIGH (ref 4.0–10.5)
nRBC: 0 % (ref 0.0–0.2)

## 2022-08-05 LAB — BASIC METABOLIC PANEL
Anion gap: 10 (ref 5–15)
BUN: 13 mg/dL (ref 8–23)
CO2: 24 mmol/L (ref 22–32)
Calcium: 9 mg/dL (ref 8.9–10.3)
Chloride: 103 mmol/L (ref 98–111)
Creatinine, Ser: 1 mg/dL (ref 0.44–1.00)
GFR, Estimated: 56 mL/min — ABNORMAL LOW (ref >60)
Glucose, Bld: 109 mg/dL — ABNORMAL HIGH (ref 70–99)
Potassium: 3.6 mmol/L (ref 3.5–5.1)
Sodium: 137 mmol/L (ref 135–145)

## 2022-08-05 LAB — RETICULOCYTES
Immature Retic Fract: 28.6 % — ABNORMAL HIGH (ref 2.3–15.9)
RBC.: 3.06 MIL/uL — ABNORMAL LOW (ref 3.87–5.11)
Retic Count, Absolute: 63.6 10*3/uL (ref 19.0–186.0)
Retic Ct Pct: 2.1 % (ref 0.4–3.1)

## 2022-08-05 LAB — BLOOD GAS, ARTERIAL
Acid-base deficit: 2.6 mmol/L — ABNORMAL HIGH (ref 0.0–2.0)
Bicarbonate: 23.7 mmol/L (ref 20.0–28.0)
O2 Content: 4 L/min
O2 Saturation: 96.9 %
Patient temperature: 37
pCO2 arterial: 46 mmHg (ref 32–48)
pH, Arterial: 7.32 — ABNORMAL LOW (ref 7.35–7.45)
pO2, Arterial: 98 mmHg (ref 83–108)

## 2022-08-05 LAB — IRON AND TIBC
Iron: 23 ug/dL — ABNORMAL LOW (ref 28–170)
Saturation Ratios: 10 % — ABNORMAL LOW (ref 10.4–31.8)
TIBC: 221 ug/dL — ABNORMAL LOW (ref 250–450)
UIBC: 198 ug/dL

## 2022-08-05 LAB — FOLATE: Folate: 9.1 ng/mL (ref >5.9)

## 2022-08-05 LAB — PROTIME-INR
INR: 1.5 — ABNORMAL HIGH (ref 0.8–1.2)
Prothrombin Time: 17.9 seconds — ABNORMAL HIGH (ref 11.4–15.2)

## 2022-08-05 LAB — TROPONIN I (HIGH SENSITIVITY)
Troponin I (High Sensitivity): 19 ng/L — ABNORMAL HIGH (ref <18)
Troponin I (High Sensitivity): 17 ng/L (ref <18)

## 2022-08-05 LAB — VITAMIN B12: Vitamin B-12: 1221 pg/mL — ABNORMAL HIGH (ref 180–914)

## 2022-08-05 LAB — FERRITIN: Ferritin: 304 ng/mL (ref 11–307)

## 2022-08-05 MED ORDER — MORPHINE SULFATE (PF) 2 MG/ML IV SOLN
2.0000 mg | INTRAVENOUS | Status: DC | PRN
  Administered 2022-08-05: 2 mg via INTRAVENOUS
  Filled 2022-08-05: qty 1

## 2022-08-05 MED ORDER — ONDANSETRON HCL 4 MG/2ML IJ SOLN: 4.0000 mg | Freq: Four times a day (QID) | INTRAMUSCULAR | Status: DC | PRN

## 2022-08-05 MED ORDER — SODIUM CHLORIDE 0.9 % IV SOLN
500.0000 mg | Freq: Once | INTRAVENOUS | Status: DC
  Administered 2022-08-05: 500 mg via INTRAVENOUS
  Filled 2022-08-05: qty 5

## 2022-08-05 MED ORDER — SODIUM CHLORIDE 0.9 % IV SOLN: 500.0000 mg | INTRAVENOUS | Status: DC

## 2022-08-05 MED ORDER — IPRATROPIUM-ALBUTEROL 0.5-2.5 (3) MG/3ML IN SOLN
3.0000 mL | RESPIRATORY_TRACT | Status: DC
  Administered 2022-08-05: 3 mL via RESPIRATORY_TRACT
  Filled 2022-08-05: qty 3

## 2022-08-05 MED ORDER — IOHEXOL 350 MG/ML SOLN
75.0000 mL | Freq: Once | INTRAVENOUS | Status: AC | PRN
  Administered 2022-08-05: 75 mL via INTRAVENOUS

## 2022-08-05 MED ORDER — METHYLPREDNISOLONE SODIUM SUCC 125 MG IJ SOLR
125.0000 mg | INTRAMUSCULAR | Status: DC
  Administered 2022-08-05: 125 mg via INTRAVENOUS
  Filled 2022-08-05: qty 2

## 2022-08-05 MED ORDER — ALPRAZOLAM 0.5 MG PO TABS
0.5000 mg | ORAL_TABLET | Freq: Once | ORAL | Status: AC
  Administered 2022-08-06: 0.5 mg via ORAL
  Filled 2022-08-05: qty 1

## 2022-08-05 MED ORDER — MORPHINE SULFATE (PF) 4 MG/ML IV SOLN
4.0000 mg | Freq: Once | INTRAVENOUS | Status: AC
  Administered 2022-08-05: 4 mg via INTRAVENOUS
  Filled 2022-08-05: qty 1

## 2022-08-05 MED ORDER — SODIUM CHLORIDE 0.9 % IV SOLN: 2.0000 g | INTRAVENOUS | Status: DC

## 2022-08-05 MED ORDER — ENOXAPARIN SODIUM 100 MG/ML IJ SOSY
1.5000 mg/kg | PREFILLED_SYRINGE | INTRAMUSCULAR | Status: DC
  Administered 2022-08-05: 90 mg via SUBCUTANEOUS
  Filled 2022-08-05: qty 0.9

## 2022-08-05 MED ORDER — IPRATROPIUM-ALBUTEROL 0.5-2.5 (3) MG/3ML IN SOLN
3.0000 mL | Freq: Four times a day (QID) | RESPIRATORY_TRACT | Status: DC
  Administered 2022-08-06 (×2): 3 mL via RESPIRATORY_TRACT
  Filled 2022-08-05 (×2): qty 3

## 2022-08-05 MED ORDER — SODIUM CHLORIDE 0.9 % IV SOLN: INTRAVENOUS | Status: DC

## 2022-08-05 MED ORDER — IPRATROPIUM-ALBUTEROL 0.5-2.5 (3) MG/3ML IN SOLN
3.0000 mL | RESPIRATORY_TRACT | Status: DC | PRN
  Administered 2022-08-05 – 2022-08-09 (×3): 3 mL via RESPIRATORY_TRACT
  Filled 2022-08-05 (×3): qty 3

## 2022-08-05 MED ORDER — METRONIDAZOLE 500 MG/100ML IV SOLN
500.0000 mg | Freq: Two times a day (BID) | INTRAVENOUS | Status: DC
  Administered 2022-08-06: 500 mg via INTRAVENOUS
  Filled 2022-08-05: qty 100

## 2022-08-05 MED ORDER — METRONIDAZOLE 500 MG/100ML IV SOLN
500.0000 mg | Freq: Once | INTRAVENOUS | Status: AC
  Administered 2022-08-05: 500 mg via INTRAVENOUS
  Filled 2022-08-05: qty 100

## 2022-08-05 MED ORDER — ONDANSETRON HCL 4 MG PO TABS: 4.0000 mg | ORAL_TABLET | Freq: Four times a day (QID) | ORAL | Status: DC | PRN

## 2022-08-05 MED ORDER — SODIUM CHLORIDE 0.9 % IV SOLN
1.0000 g | Freq: Once | INTRAVENOUS | Status: AC
  Administered 2022-08-05: 1 g via INTRAVENOUS
  Filled 2022-08-05: qty 10

## 2022-08-05 MED ORDER — SODIUM CHLORIDE 0.9 % IV SOLN
500.0000 mg | INTRAVENOUS | Status: DC
  Filled 2022-08-05: qty 5

## 2022-08-05 MED ORDER — LORAZEPAM 2 MG/ML IJ SOLN
0.5000 mg | Freq: Once | INTRAMUSCULAR | Status: AC
  Administered 2022-08-05: 0.5 mg via INTRAVENOUS
  Filled 2022-08-05: qty 1

## 2022-08-05 NOTE — ED Provider Notes (Signed)
[facility] Provider Note   Event Date/Time   First MD Initiated Contact with Patient 08/05/22 [phone]     (approximate) History  Shortness of Breath  HPI Sydney Gillespie is a 82 y.o. female with a stated past medical history of recent pneumonia who presents complaining of shortness of breath and the sensation that she cannot get a good deep breath in.  Patient states that she initially thought this was an anxiety attack and took Xanax prior to arrival with minimal improvement in her shortness of breath.  Patient is concerned as she states she feels similar to when she has had pneumonia in the past and presents in order to be evaluated. ROS: Patient currently denies any vision changes, tinnitus, difficulty speaking, facial droop, sore throat, abdominal pain, nausea/vomiting/diarrhea, dysuria, or weakness/numbness/paresthesias in any extremity   Physical Exam  Triage Vital Signs: ED Triage Vitals  Enc Vitals Group     BP 08/05/22 0804 (!) 153/83     Pulse Rate 08/05/22 0804 81     Resp 08/05/22 0804 20     Temp 08/05/22 0804 98.2 F (36.8 C)     Temp src --      SpO2 08/05/22 0804 94 %     Weight 08/05/22 0805 132 lb (59.9 kg)     Height 08/05/22 0805 5\' 5"  (1.651 m)     Head Circumference --      Peak Flow --      Pain Score 08/05/22 0805 7     Pain Loc --      Pain Edu? --      Excl. in [place]? --    Most recent vital signs: Vitals:   08/06/22 0209 08/06/22 0417  BP:  (!) 142/68  Pulse:  76  Resp:  20  Temp:  97.6 F (36.4 C)  SpO2: 100% 100%   General: Awake, oriented x4. CV:  Good peripheral perfusion.  Resp:  Mildly increased effort.  Clear to auscultation bilaterally Abd:  No distention.  Other:  Elderly Caucasian female laying in bed in mild respiratory distress ED Results / Procedures / Treatments  Labs (all labs ordered are listed, but only abnormal results are displayed) Labs Reviewed  BLOOD CULTURE ID PANEL (REFLEXED) - BCID2 -  Abnormal; Notable for the following components:      Result Value   Staphylococcus species DETECTED (*)    Staphylococcus aureus (BCID) DETECTED (*)    All other components within normal limits  BASIC METABOLIC PANEL - Abnormal; Notable for the following components:   Glucose, Bld 109 (*)    GFR, Estimated 56 (*)    All other components within normal limits  CBC - Abnormal; Notable for the following components:   WBC 12.9 (*)    RBC 3.06 (*)    Hemoglobin 9.1 (*)    HCT 30.7 (*)    MCV 100.3 (*)    MCHC 29.6 (*)    RDW 16.7 (*)    Platelets 452 (*)    All other components within normal limits  VITAMIN B12 - Abnormal; Notable for the following components:   Vitamin B-12 1,221 (*)    All other components within normal limits  IRON AND TIBC - Abnormal; Notable for the following components:   Iron 23 (*)    TIBC 221 (*)    Saturation Ratios 10 (*)    All other components within normal limits  RETICULOCYTES - Abnormal; Notable for the following components:  RBC. 3.06 (*)    Immature Retic Fract 28.6 (*)    All other components within normal limits  PROTIME-INR - Abnormal; Notable for the following components:   Prothrombin Time 17.9 (*)    INR 1.5 (*)    All other components within normal limits  BLOOD GAS, ARTERIAL - Abnormal; Notable for the following components:   pH, Arterial 7.32 (*)    Acid-base deficit 2.6 (*)    All other components within normal limits  TROPONIN I (HIGH SENSITIVITY) - Abnormal; Notable for the following components:   Troponin I (High Sensitivity) 19 (*)    All other components within normal limits  CULTURE, BLOOD (ROUTINE X 2)  CULTURE, BLOOD (ROUTINE X 2)  RESPIRATORY PANEL BY PCR  EXPECTORATED SPUTUM ASSESSMENT W GRAM STAIN, RFLX TO RESP C  FOLATE  FERRITIN  STREP PNEUMONIAE URINARY ANTIGEN  LEGIONELLA PNEUMOPHILA SEROGP 1 UR AG  CBC  COMPREHENSIVE METABOLIC PANEL  TROPONIN I (HIGH SENSITIVITY)   EKG ED ECG REPORT I, Naaman Plummer, the  attending physician, personally viewed and interpreted this ECG. Date: 08/05/2022 EKG Time: 0801 Rate: 81 Rhythm: normal sinus rhythm QRS Axis: normal Intervals: normal ST/T Wave abnormalities: normal Narrative Interpretation: no evidence of acute ischemia RADIOLOGY ED MD interpretation: 2 view chest x-ray interpreted by me and shows right greater than left basilar opacities with increased compared to the prior concerning for infection  CT angiography of the chest interpreted independently by me and shows no evidence of pulmonary embolism however there is moderate right and small left pleural effusions with associated groundglass opacities with tree-in-bud in the left lower lobe suspicion for possible aspiration -Agree with radiology assessment Official radiology report(s): US Venous Img Lower Bilateral (DVT)  Result Date: 08/05/2022 CLINICAL DATA:  Lower extremity weakness EXAM: BILATERAL LOWER EXTREMITY VENOUS DOPPLER ULTRASOUND TECHNIQUE: Gray-scale sonography with compression, as well as color and duplex ultrasound, were performed to evaluate the deep venous system(s) from the level of the common femoral vein through the popliteal and proximal calf veins. COMPARISON:  None Available. FINDINGS: VENOUS Nonocclusive thrombus in the right posterior tibial vein. Remaining venous structures of both lower extremities demonstrate no evidence of DVT. OTHER None. Limitations: none IMPRESSION: 1. Nonocclusive calf vein DVT involving the right posterior tibial vein. No other deep vein thrombosis is demonstrated in either lower extremity. These results will be called to the ordering clinician or representative by the Radiologist Assistant, and communication documented in the PACS or [employer]. Electronically Signed   By: Van Clines M.D.   On: 08/05/2022 15:28   CT Angio Chest PE W/Cm &/Or Wo Cm  Result Date: 08/05/2022 CLINICAL DATA:  Pulmonary embolism (PE) suspected, high prob. Shortness  of breath. EXAM: CT ANGIOGRAPHY CHEST WITH CONTRAST TECHNIQUE: Multidetector CT imaging of the chest was performed using the standard protocol during bolus administration of intravenous contrast. Multiplanar CT image reconstructions and MIPs were obtained to evaluate the vascular anatomy. RADIATION DOSE REDUCTION: This exam was performed according to the departmental dose-optimization program which includes automated exposure control, adjustment of the mA and/or kV according to patient size and/or use of iterative reconstruction technique. CONTRAST:  28mL OMNIPAQUE IOHEXOL 350 MG/ML SOLN COMPARISON:  CTA chest 05/06/2022. FINDINGS: Cardiovascular: Satisfactory opacification of the pulmonary arteries to the segmental level. No evidence of pulmonary embolism. Streak artifact from vertebral augmentation cement in the lower lobes. Normal heart size. No pericardial effusion. Atherosclerotic calcifications of the aorta and coronary arteries. Mediastinum/Nodes: No enlarged mediastinal, hilar, or axillary  lymph nodes. Thyroid gland, trachea, and esophagus demonstrate no significant findings. Lungs/Pleura: Moderate right and small left pleural effusions with adjacent atelectasis in the lower lobes. Ground-glass and tree-in-bud opacities in the left lower lobe, suspicious for aspiration or infection. No consolidation. No pneumothorax. Upper Abdomen: No acute abnormality. Musculoskeletal: Interval vertebral augmentation at T8, T9 and T10. Unchanged degenerative Schmorl's node in the T7 superior endplate. Unchanged mild compression deformity at T12. Partially visualized changes from prior ACDF. Review of the MIP images confirms the above findings. IMPRESSION: 1. No evidence of pulmonary embolism. 2. Moderate right and small left pleural effusions with adjacent atelectasis in the lower lobes. 3. Ground-glass and tree-in-bud opacities in the left lower lobe, suspicious for aspiration or infection. 4. Aortic Atherosclerosis  (ICD10-I70.0).  Coronary atherosclerosis. Electronically Signed   By: Emmit Alexanders M.D.   On: 08/05/2022 09:51   DG Chest 2 View  Result Date: 08/05/2022 CLINICAL DATA:  Shortness of breath EXAM: CHEST - 2 VIEW COMPARISON:  Chest x-ray dated May 09, 2022 FINDINGS: Cardiac and mediastinal contours are within normal limits. Elevation of the right hemidiaphragm. Right-greater-than-left basilar opacities, increased when compared with the prior no large pleural effusion or evidence of pneumothorax. IMPRESSION: Right-greater-than-left basilar opacities, increased when compared with the prior, concerning for infection or aspiration. Electronically Signed   By: Yetta Glassman M.D.   On: 08/05/2022 08:36   PROCEDURES: Critical Care performed: No .1-3 Lead EKG Interpretation  Performed by: Naaman Plummer, MD Authorized by: Naaman Plummer, MD     Interpretation: normal     ECG rate:  71   ECG rate assessment: normal     Rhythm: sinus rhythm     Ectopy: none     Conduction: normal    MEDICATIONS ORDERED IN ED: Medications  0.9 %  sodium chloride infusion ( Intravenous Infusion Verify 08/06/22 0604)  ondansetron (ZOFRAN) tablet 4 mg (has no administration in time range)    Or  ondansetron (ZOFRAN) injection 4 mg (has no administration in time range)  morphine (PF) 2 MG/ML injection 2 mg (2 mg Intravenous Given 08/05/22 1608)  ALPRAZolam (XANAX) tablet 0.5 mg ( Oral Canceled Entry 08/05/22 2139)  ipratropium-albuterol (DUONEB) 0.5-2.5 (3) MG/3ML nebulizer solution 3 mL (3 mLs Nebulization Given 08/05/22 1740)  methylPREDNISolone sodium succinate (SOLU-MEDROL) 125 mg/2 mL injection 125 mg (125 mg Intravenous Given 08/05/22 1741)  ipratropium-albuterol (DUONEB) 0.5-2.5 (3) MG/3ML nebulizer solution 3 mL (3 mLs Nebulization Given 08/06/22 0209)  ceFAZolin (ANCEF) IVPB 2g/100 mL premix (has no administration in time range)  enoxaparin (LOVENOX) injection 60 mg (has no administration in time range)   iohexol (OMNIPAQUE) 350 MG/ML injection 75 mL (75 mLs Intravenous Contrast Given 08/05/22 0916)  cefTRIAXone (ROCEPHIN) 1 g in sodium chloride 0.9 % 100 mL IVPB (0 g Intravenous Stopped 08/05/22 1133)  metroNIDAZOLE (FLAGYL) IVPB 500 mg (0 mg Intravenous Stopped 08/05/22 1235)  LORazepam (ATIVAN) injection 0.5 mg (0.5 mg Intravenous Given 08/05/22 1620)  morphine (PF) 4 MG/ML injection 4 mg (4 mg Intravenous Given 08/05/22 1741)  ceFAZolin (ANCEF) IVPB 2g/100 mL premix (0 g Intravenous Stopped 08/06/22 0602)   IMPRESSION / MDM / ASSESSMENT AND PLAN / ED COURSE  I reviewed the triage vital signs and the nursing notes.                             The patient is on the cardiac monitor to evaluate for evidence of arrhythmia and/or significant  heart rate changes. Patient's presentation is most consistent with acute presentation with potential threat to life or bodily function. Presents with shortness of breath, cough, and malaise concerning for pneumonia.  DDx: PE, COPD exacerbation, Pneumothorax, TB, Atypical ACS, Esophageal Rupture, Toxic Exposure, Foreign Body Airway Obstruction.  Workup: CXR CBC, CMP, lactate, troponin  Given History, Exam, and Workup presentation most consistent with pneumonia.  Findings: Left lower lobe pneumonia  Tx: Ceftriaxone 1g IV Azithromycin 500mg  IV Flagyl Reassessment: As patient is continuing to require supplemental oxygenation for acute respiratory distress, patient will require admission to the internal medicine service for further evaluation and management  Disposition: Admit   FINAL CLINICAL IMPRESSION(S) / ED DIAGNOSES   Final diagnoses:  SOB (shortness of breath)  Pneumonia of left lower lobe due to infectious organism  Respiratory distress   Rx / DC Orders   ED Discharge Orders     None      Note:  This document was prepared using Dragon voice recognition software and may include unintentional dictation errors.   Naaman Plummer,  MD 08/06/22 [phone]

## 2022-08-05 NOTE — Assessment & Plan Note (Signed)
Restart synthroid pending clearance by SLP

## 2022-08-05 NOTE — ED Notes (Signed)
Pt placed on 2 lpm O2 via [place] for comfort. MD notified of potential need for CTA. IV placed and labs obtained. Pt transported to x-ray

## 2022-08-05 NOTE — Assessment & Plan Note (Signed)
New onset resp failure w/ hypoxia w/ noted LLL PNA concerning for possible aspiration event  WBC 13  Started on rocephin, azithromycin, flagyl in ER  Will continue  De-escalate as appropriate  Covid, flu, rsv negative  Check blood, resp culture  Urine strep and legionella Expanded resp panel  Follow

## 2022-08-05 NOTE — Progress Notes (Signed)
Critical result paged to Dr. Ernestina Patches. Awaiting new orders.

## 2022-08-05 NOTE — Assessment & Plan Note (Signed)
S/p T8, T9 and T10 kyphoplasty and vertebral body biopsy 07/29/2022 by Dr. Arnoldo Morale w/ NSG  Appears stable  Pain control  NSG c/s as clinically indicated.

## 2022-08-05 NOTE — Assessment & Plan Note (Signed)
New O2 requirement in setting of LLL PNA on CT concerning for aspiration PNA No evidence of PE  IV rocephin and azithromycin for infectious coverage  Covid, flu, rsv pending  Cont supplemental O2

## 2022-08-05 NOTE — Assessment & Plan Note (Signed)
Hgb 9 today which appears to be near baseline  MCV 100  Will check anemia panel

## 2022-08-05 NOTE — Assessment & Plan Note (Signed)
Cr 1 today w/ baseline Cr 1-1.5  Hold nephrotoxic agents

## 2022-08-05 NOTE — Progress Notes (Signed)
Rapid called due to patient's acute change. Complains of SOB at rest, restless, anxious.

## 2022-08-05 NOTE — Assessment & Plan Note (Signed)
On eliquis  Will transition to lovenox pending speech eval in setting of aspiration PNA

## 2022-08-05 NOTE — ED Triage Notes (Addendum)
As per pt, pt woke up with shortness of breath. Pt denies COPD or other breathing complications. Pt denies using oxygen at home. As per duaghter, daughter thought it could be anxiety and gave pt xanax prior to arrival. Pt states it feels like she can't get a deep breath.  Daughter states pt was taken off blood thinner for KP last week.

## 2022-08-05 NOTE — H&P (Addendum)
History and Physical    Patient: Sydney Gillespie [id number] DOB: 13-Jul-1940 DOA: 08/05/2022 DOS: the patient was seen and examined on 08/05/2022 PCP: [name], MD  Patient coming from: Home  Chief Complaint:  Chief Complaint  Patient presents with   Shortness of Breath   HPI: Sydney Gillespie is a 82 y.o. female with medical history significant of multiple medical issues including Barrett's esophagus, compression fracture status post kyphoplasty March 20, hyperlipidemia, hypothyroidism, gout, stage IV CKD, DVT on Eliquis presenting with acute respiratory failure with hypoxia and aspiration pneumonia.  Patient noted to have been admitted recently for thoracic compression fractures with noted T8, T9 and T10 kyphoplasty and vertebral body biopsy by Dr. Arnoldo Morale on March 20.  Patient says overall stable pain since hospitalization.  Patient with significant increase shortness of breath over the past 12 to 24 hours.  Most predominant since this morning.  Positive nausea but no reported vomiting.  No chest pain or abdominal pain.  No hemiparesis or confusion.  Has had worsening weakness and fatigue from that point.  Positive decreased p.o. intake.  Some remote history of dysphagia in the past per the grand daughter.  O2 sats were dropping in to the upper 80s to low 90s at home. Presented to the ER afebrile, hemodynamically stable.  Requiring 2 L nasal cannula to keep O2 sats greater than 90%.  White count 13, hemoglobin 9.1, platelets 452, troponin 19.  Creatinine 1.  CTA of the chest negative for PE, noted groundglass tree-in-bud opacities in the left lower lobe suspicious for aspiration or infection.  Started on Rocephin, azithromycin and Flagyl in the ER. Review of Systems: As mentioned in the history of present illness. All other systems reviewed and are negative. Past Medical History:  Diagnosis Date   Acute respiratory failure with hypoxia ([place]) 05/07/2022   Allergic rhinitis     Allergy    Anemia    Anemia    Barrett's esophagus    Blood dyscrasia    multiple myloma remission   Cancer ([place])    Change in bowel habits    Compression fracture 2013   Degenerative disc disease, lumbar    Dysrhythmia    palpitations   Esophageal reflux    Family history of adverse reaction to anesthesia    nausea -mom   GERD (gastroesophageal reflux disease)    Gout    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[place])    Heart palpitations    Hyperlipidemia    Hypothyroidism    Inflammatory polyarthropathy (HCC)    Lumbago    Lumbar radiculitis    Lumbar stenosis with neurogenic claudication    Multiple myeloma (HCC)    Multiple myeloma (HCC)    Neuropathy    Bilateral Feet   Osteopenia    Osteoporosis    Other dysphagia    Palpitations    Pneumonia 04/2022   R/T COVID   Positive PPD    Renal insufficiency    Rheumatoid arthritis (HCC)    Rheumatoid arthritis (HCC)    Seasonal allergies    Tuberculosis 2011   Vertigo    Past Surgical History:  Procedure Laterality Date   ABDOMINAL HYSTERECTOMY     APPENDECTOMY     BACK SURGERY     BREAST EXCISIONAL BIOPSY Bilateral    neg   CATARACT EXTRACTION Bilateral    CHOLECYSTECTOMY     COLONOSCOPY WITH PROPOFOL N/A 11/12/2016   Procedure: COLONOSCOPY WITH PROPOFOL;  Surgeon: Manya Silvas, MD;  Location:  [facility];  Service: Endoscopy;  Laterality: N/A;   ESOPHAGOGASTRODUODENOSCOPY (EGD) WITH PROPOFOL N/A 11/12/2016   Procedure: ESOPHAGOGASTRODUODENOSCOPY (EGD) WITH PROPOFOL;  Surgeon: Manya Silvas, MD;  Location: [facility] ENDOSCOPY;  Service: Endoscopy;  Laterality: N/A;   ESOPHAGOGASTRODUODENOSCOPY (EGD) WITH PROPOFOL N/A 07/17/2022   Procedure: ESOPHAGOGASTRODUODENOSCOPY (EGD) WITH PROPOFOL;  Surgeon: Lesly Rubenstein, MD;  Location: [facility] ENDOSCOPY;  Service: Endoscopy;  Laterality: N/A;   FOOT SURGERY     KYPHOPLASTY N/A 07/29/2022   Procedure: KYPHOPLASTY THORACIC EIGHT, THORACIC NINE, THORACIC TEN;  Surgeon:  Newman Pies, MD;  Location: [place];  Service: Neurosurgery;  Laterality: N/A;  3C   LUMBAR LAMINECTOMY/DECOMPRESSION MICRODISCECTOMY Left 06/18/2016   Procedure: LAMINECTOMY FOR FACET/SYNOVIAL CYST LUMBAR THREE - LUMBAR FOUR LEFT;  Surgeon: Newman Pies, MD;  Location: [place];  Service: Neurosurgery;  Laterality: Left;  LAMINECTOMY FOR FACET/SYNOVIAL CYST LUMBAR THREE - LUMBAR FOUR LEFT   Social History:  reports that she has never smoked. She has never used smokeless tobacco. She reports that she does not drink alcohol and does not use drugs.  Allergies  Allergen Reactions   Isoniazid Other (See Comments)    Blisters     Family History  Problem Relation Age of Onset   Hypertension Mother    Heart attack Mother    Rheum arthritis Mother    Osteoarthritis Mother    Cancer Father    Hypertension Father    Stroke Father    Breast cancer Maternal Grandmother 45   Osteoarthritis Other    Rheum arthritis Other     Prior to Admission medications   Medication Sig Start Date End Date Taking? Authorizing Provider  acyclovir (ZOVIRAX) 400 MG tablet Take 1 tablet (400 mg total) by mouth 2 (two) times daily. Patient not taking: Reported on 07/28/2022 04/10/22   Cammie Sickle, MD  albuterol (VENTOLIN HFA) 108 (90 Base) MCG/ACT inhaler Inhale 2 puffs into the lungs every 4 (four) hours as needed for wheezing or shortness of breath. Patient not taking: Reported on 06/19/2022 04/27/22   Sharen Hones, MD  allopurinol (ZYLOPRIM) 300 MG tablet Take 300 mg by mouth daily. 09/14/14   [provider]  ALPRAZolam (XANAX) 0.25 MG tablet TAKE 1 TABLET(0.25 MG) BY MOUTH AT BEDTIME AS NEEDED FOR ANXIETY Patient not taking: Reported on 07/28/2022 04/24/22   Cammie Sickle, MD  Ascorbic Acid (VITAMIN C) 1000 MG tablet Take 1,000 mg by mouth daily.    [provider]  atenolol (TENORMIN) 50 MG tablet Take 0.5 tablets (25 mg total) by mouth daily. Patient taking differently: Take  50 mg by mouth daily. 09/05/18   Ghimire, Henreitta Leber, MD  benzonatate (TESSALON) 200 MG capsule Take 1 capsule (200 mg total) by mouth 3 (three) times daily as needed for cough. Patient not taking: Reported on 05/21/2022 04/24/22   Verlon Au, NP  CALCIUM MAGNESIUM ZINC PO Take 1 tablet by mouth 2 (two) times daily.    [provider]  chlorpheniramine-HYDROcodone (TUSSIONEX) 10-8 MG/5ML Take 5 mLs by mouth every 12 (twelve) hours as needed (for cough unrelieved by other medications). May cause drowsiness. Patient not taking: Reported on 06/19/2022 04/24/22   Verlon Au, NP  Cholecalciferol (VITAMIN D-3) 125 MCG (5000 UT) TABS Take 5,000 Units by mouth daily.    [provider]  docusate sodium (COLACE) 100 MG capsule Take 1 capsule (100 mg total) by mouth 2 (two) times daily. 07/30/22   Newman Pies, MD  DULoxetine (CYMBALTA) 20 MG  capsule Take 20 mg by mouth 2 (two) times daily. 10/02/20   [provider]  ELIQUIS 5 MG TABS tablet TAKE 1 TABLET(5 MG) BY MOUTH TWICE DAILY 08/03/22   Cammie Sickle, MD  furosemide (LASIX) 40 MG tablet Take 1 tablet (40 mg total) by mouth daily. Patient not taking: Reported on 07/28/2022 05/11/22   Ezekiel Slocumb, DO  guaiFENesin (MUCINEX) 600 MG 12 hr tablet Take 1 tablet (600 mg total) by mouth 2 (two) times daily. Patient not taking: Reported on 05/21/2022 05/10/22   Nicole Kindred A, DO  hydroxychloroquine (PLAQUENIL) 200 MG tablet Take 200 mg by mouth See admin instructions. Patient currently takes 200 mg twice daily Monday through Friday and 200 mg once daily Saturday and Sunday 10/25/18   [provider]  Ipratropium-Albuterol (COMBIVENT) 20-100 MCG/ACT AERS respimat Inhale 1 puff into the lungs every 6 (six) hours as needed for wheezing. Patient not taking: Reported on 06/19/2022 05/10/22   Nicole Kindred A, DO  lansoprazole (PREVACID) 30 MG capsule Take 1 capsule (30 mg total) by mouth 2 (two) times daily before  a meal. 06/12/22   Cammie Sickle, MD  lenalidomide (REVLIMID) 10 MG capsule Take 1 capsule (10 mg total) by mouth daily. Take for 21 days, then hold for 7 days. Repeat every 28 days. Patient not taking: Reported on 05/21/2022 05/19/22   Cammie Sickle, MD  levothyroxine (SYNTHROID) 112 MCG tablet Take 112 mcg by mouth daily before breakfast. 01/16/22   [provider]  MYRBETRIQ 50 MG TB24 tablet Take 50 mg by mouth daily.    [provider]  OLANZapine (ZYPREXA) 10 MG tablet Take 1 tablet (10 mg total) by mouth at bedtime. 07/20/22   Cammie Sickle, MD  oxybutynin (DITROPAN-XL) 10 MG 24 hr tablet Take 1 tablet (10 mg total) by mouth daily. 10/21/20   Bjorn Loser, MD  oxyCODONE-acetaminophen (PERCOCET/ROXICET) 5-325 MG tablet Take 1 tablet by mouth every 4 (four) hours as needed for moderate pain. 07/30/22   Newman Pies, MD  Potassium 99 MG TABS Take 1 tablet by mouth daily.    [provider]  rOPINIRole (REQUIP) 0.5 MG tablet Take 0.5 mg by mouth at bedtime. 10/02/20   [provider]  Spacer/Aero-Holding Josiah Lobo DEVI 1 each by Does not apply route as needed. 05/10/22   Ezekiel Slocumb, DO  tiZANidine (ZANAFLEX) 2 MG tablet Take 2 mg by mouth 2 (two) times daily. 04/22/22   [provider]  vitamin B-12 (CYANOCOBALAMIN) 1000 MCG tablet Take 1,000 mcg by mouth daily. 01/03/20   [provider]  zinc gluconate 50 MG tablet Take 50 mg by mouth daily.    [provider]    Physical Exam: Vitals:   08/05/22 1030 08/05/22 1100 08/05/22 1200 08/05/22 1201  BP: (!) 165/81 (!) 160/78 (!) 171/88   Pulse: 77 74 77   Resp: 17 16 (!) 21   Temp:    98.7 F (37.1 C)  TempSrc:    Oral  SpO2: 99% 100% 100%   Weight:      Height:       Physical Exam Constitutional:      Appearance: She is normal weight.  HENT:     Head: Normocephalic.     Nose: Nose normal.  Eyes:     Pupils: Pupils are equal, round, and  reactive to light.  Cardiovascular:     Rate and Rhythm: Normal rate and regular rhythm.  Pulmonary:  Effort: Pulmonary effort is normal.  Abdominal:     General: Bowel sounds are normal.  Musculoskeletal:        General: Normal range of motion.  Skin:    General: Skin is warm.  Neurological:     General: No focal deficit present.  Psychiatric:        Mood and Affect: Mood normal.     Data Reviewed:  There are no new results to review at this time. CT Angio Chest PE W/Cm &/Or Wo Cm CLINICAL DATA:  Pulmonary embolism (PE) suspected, high prob. Shortness of breath.  EXAM: CT ANGIOGRAPHY CHEST WITH CONTRAST  TECHNIQUE: Multidetector CT imaging of the chest was performed using the standard protocol during bolus administration of intravenous contrast. Multiplanar CT image reconstructions and MIPs were obtained to evaluate the vascular anatomy.  RADIATION DOSE REDUCTION: This exam was performed according to the departmental dose-optimization program which includes automated exposure control, adjustment of the mA and/or kV according to patient size and/or use of iterative reconstruction technique.  CONTRAST:  74mL OMNIPAQUE IOHEXOL 350 MG/ML SOLN  COMPARISON:  CTA chest 05/06/2022.  FINDINGS: Cardiovascular: Satisfactory opacification of the pulmonary arteries to the segmental level. No evidence of pulmonary embolism. Streak artifact from vertebral augmentation cement in the lower lobes. Normal heart size. No pericardial effusion. Atherosclerotic calcifications of the aorta and coronary arteries.  Mediastinum/Nodes: No enlarged mediastinal, hilar, or axillary lymph nodes. Thyroid gland, trachea, and esophagus demonstrate no significant findings.  Lungs/Pleura: Moderate right and small left pleural effusions with adjacent atelectasis in the lower lobes. Ground-glass and tree-in-bud opacities in the left lower lobe, suspicious for aspiration or infection. No  consolidation. No pneumothorax.  Upper Abdomen: No acute abnormality.  Musculoskeletal: Interval vertebral augmentation at T8, T9 and T10. Unchanged degenerative Schmorl's node in the T7 superior endplate. Unchanged mild compression deformity at T12. Partially visualized changes from prior ACDF.  Review of the MIP images confirms the above findings.  IMPRESSION: 1. No evidence of pulmonary embolism. 2. Moderate right and small left pleural effusions with adjacent atelectasis in the lower lobes. 3. Ground-glass and tree-in-bud opacities in the left lower lobe, suspicious for aspiration or infection. 4. Aortic Atherosclerosis (ICD10-I70.0).  Coronary atherosclerosis.  Electronically Signed   By: Emmit Alexanders M.D.   On: 08/05/2022 09:51 DG Chest 2 View CLINICAL DATA:  Shortness of breath  EXAM: CHEST - 2 VIEW  COMPARISON:  Chest x-ray dated May 09, 2022  FINDINGS: Cardiac and mediastinal contours are within normal limits. Elevation of the right hemidiaphragm. Right-greater-than-left basilar opacities, increased when compared with the prior no large pleural effusion or evidence of pneumothorax.  IMPRESSION: Right-greater-than-left basilar opacities, increased when compared with the prior, concerning for infection or aspiration.  Electronically Signed   By: Yetta Glassman M.D.   On: 08/05/2022 08:36  Lab Results  Component Value Date   WBC 12.9 (H) 08/05/2022   HGB 9.1 (L) 08/05/2022   HCT 30.7 (L) 08/05/2022   MCV 100.3 (H) 08/05/2022   PLT 452 (H) [id number]   Last metabolic panel Lab Results  Component Value Date   GLUCOSE 109 (H) 08/05/2022   NA 137 08/05/2022   K 3.6 08/05/2022   CL 103 08/05/2022   CO2 24 08/05/2022   BUN 13 08/05/2022   CREATININE 1.00 08/05/2022   GFRNONAA 56 (L) 08/05/2022   CALCIUM 9.0 08/05/2022   PHOS 3.0 05/10/2022   PROT 7.2 07/20/2022   ALBUMIN 3.4 (L) 07/20/2022   LABGLOB 3.3 07/20/2022  AGRATIO 1.1 12/11/2015    BILITOT 0.5 07/20/2022   ALKPHOS 68 07/20/2022   AST 23 07/20/2022   ALT 7 07/20/2022   ANIONGAP 10 08/05/2022    Assessment and Plan: * Pneumonia New onset resp failure w/ hypoxia w/ noted LLL PNA concerning for possible aspiration event  WBC 13  Started on rocephin, azithromycin, flagyl in ER  Will continue  De-escalate as appropriate  Covid, flu, rsv negative  Check blood, resp culture  Urine strep and legionella Expanded resp panel  Follow     Acute respiratory failure with hypoxia ([place]) New O2 requirement in setting of LLL PNA on CT concerning for aspiration PNA No evidence of PE  IV rocephin, flagyl and azithromycin for infectious coverage  Covid, flu, rsv pending  Cont supplemental O2     CKD (chronic kidney disease), stage IV (HCC) Cr 1 today w/ baseline Cr 1-1.5  Hold nephrotoxic agents    Gout Restart allopurinol pending clearance by SLP    Hypothyroidism Restart synthroid pending clearance by SLP   Iron deficiency anemia Hgb 9 today which appears to be near baseline  MCV 100  Will check anemia panel    Thoracic compression fracture, with delayed healing, subsequent encounter S/p T8, T9 and T10 kyphoplasty and vertebral body biopsy 07/29/2022 by Dr. Arnoldo Morale w/ NSG  Appears stable  Pain control  NSG c/s as clinically indicated.    Acute deep vein thrombosis (DVT) of calf muscle vein of right lower extremity (HCC) On eliquis  Will transition to lovenox pending speech eval in setting of aspiration PNA        Advance Care Planning:   Code Status: Full Code   Consults: None   Family Communication: Granddaughter at the bedside   Severity of Illness: The appropriate patient status for this patient is OBSERVATION. Observation status is judged to be reasonable and necessary in order to provide the required intensity of service to ensure the patient's safety. The patient's presenting symptoms, physical exam findings, and initial radiographic  and laboratory data in the context of their medical condition is felt to place them at decreased risk for further clinical deterioration. Furthermore, it is anticipated that the patient will be medically stable for discharge from the hospital within 2 midnights of admission.   Author: Deneise Lever, MD 08/05/2022 12:31 PM  For on call review [email or web address].

## 2022-08-05 NOTE — Significant Event (Signed)
Rapid Response Event Note   Reason for Call :  Shortness of breath  Initial Focused Assessment:  Rapid response RN arrived in patient's room with patient sitting up in bed surrounded by [facility] staff. Patient alert but restless, kept saying she could not breathe and asking staff to increase her oxygen even though her oxygen saturations 100% on 4L nasal cannula. Slight upper airway wheezes noted but lungs clear to auscultation. BP elevated at 169/121, HR 95 regular, RR 22 but appeared unlabored.   Per patient, patient had similar incident last week and took her home xanax and felt better within 30 minutes.  Interventions:  Patient given IV morphine and IV ativan per orders to help with anxiety and work of breathing. ABG ordered by MD.  Plan of Care:  Patient to remain on [place] for now. 2C  to reach back out to rapid response team if needed.  Event Summary:   MD Notified: Dr. Ernestina Patches Call Time: 16:05 Arrival Time: 16:07 End Time: 16:25  Stephanie Acre, RN  Rechecked on patient at 17:39 and Dr. Ernestina Patches at bedside with patient and visitor. Patient appeared much more calm and relaxed at this time. No needs for rapid response at that time.

## 2022-08-05 NOTE — Consult Note (Signed)
ANTICOAGULATION CONSULT NOTE - Initial Consult  Pharmacy Consult for Enoxparin Indication: DVT treatment  Allergies  Allergen Reactions   Isoniazid Other (See Comments)    Blisters     Patient Measurements: Height: 5\' 5"  (165.1 cm) Weight: 59.9 kg (132 lb) IBW/kg (Calculated) : 57 kg  Vital Signs: Temp: 98.7 F (37.1 C) (03/27 1201) Temp Source: Oral (03/27 1201) BP: 171/88 (03/27 1200) Pulse Rate: 77 (03/27 1200)  Labs: Recent Labs    08/05/22 0818 08/05/22 1100  HGB 9.1*  --   HCT 30.7*  --   PLT 452*  --   CREATININE 1.00  --   TROPONINIHS 19* 17    Estimated Creatinine Clearance: 39 mL/min (by C-G formula based on SCr of 1 mg/dL).   Medical History: Past Medical History:  Diagnosis Date   Acute respiratory failure with hypoxia ([place]) 05/07/2022   Allergic rhinitis    Allergy    Anemia    Anemia    Barrett's esophagus    Blood dyscrasia    multiple myloma remission   Cancer ([place])    Change in bowel habits    Compression fracture 2013   Degenerative disc disease, lumbar    Dysrhythmia    palpitations   Esophageal reflux    Family history of adverse reaction to anesthesia    nausea -mom   GERD (gastroesophageal reflux disease)    Gout    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[place])    Heart palpitations    Hyperlipidemia    Hypothyroidism    Inflammatory polyarthropathy (HCC)    Lumbago    Lumbar radiculitis    Lumbar stenosis with neurogenic claudication    Multiple myeloma (HCC)    Multiple myeloma (HCC)    Neuropathy    Bilateral Feet   Osteopenia    Osteoporosis    Other dysphagia    Palpitations    Pneumonia 04/2022   R/T COVID   Positive PPD    Renal insufficiency    Rheumatoid arthritis (HCC)    Rheumatoid arthritis (HCC)    Seasonal allergies    Tuberculosis 2011   Vertigo     Medications:  Scheduled:  Infusions:   sodium chloride     azithromycin (ZITHROMAX) 500 mg in sodium chloride 0.9 % 250 mL IVPB 500 mg (08/05/22 1236)    [START ON 08/06/2022] azithromycin     [START ON 08/06/2022] cefTRIAXone (ROCEPHIN)  IV     PRN: ondansetron **OR** ondansetron (ZOFRAN) IV  Assessment: Sydney Gillespie is a 82 y.o. female presenting with hypoxia and aspiration pneumonia. PMH significant for Barrett's esophagus, compression fracture s/p kyphoplasty (07/29/22), HLD, hypothyroidism, gout, CKD4, DVT (05/10/22) on Eliquis. Patient was on [facility] PTA per chart review. Last dose of Eliqus is not yet recorded. Patient will start enoxaparin for continuation of DVT treatment while inpatient pending speech evaluation in the setting of aspiration pneumonia. Pharmacy has been consulted to manage enoxaparin.    Baseline Labs: SCr 1.00 Hgb 9.1, Hct 30.7, Plt 452  Baseline PT/INR ordered  Goal of Therapy:  Heparin level 0.3-0.7 units/ml Monitor platelets by anticoagulation protocol: Yes   Plan:  Start lovenox 90 mg (1.5 mg/kg) SQ Q24H (BMI <30) Check CBC at least every 3 days Continue to monitor renal function while on enoxaparin  Gretel Acre, PharmD PGY1 Pharmacy Resident 08/05/2022 1:07 PM

## 2022-08-05 NOTE — Assessment & Plan Note (Signed)
New onset resp failure w/ hypoxia w/ noted LLL PNA concerning for possible aspiration event  WBC 13  Started on rocephin, azithromycin, flagyl in ER  Will continue rocephin and azithromycin  Covid, flu, rsv negative  Check blood, resp culture  Urine strep and legionella Expanded resp panel  Follow

## 2022-08-05 NOTE — Assessment & Plan Note (Signed)
New O2 requirement in setting of LLL PNA on CT concerning for aspiration PNA No evidence of PE  IV rocephin, flagyl and azithromycin for infectious coverage  Covid, flu, rsv pending  Cont supplemental O2

## 2022-08-05 NOTE — Progress Notes (Addendum)
Worsening resp status w/ concern for anxiety.  Per nursing, pt reports feeling anxious- had similar episode last week that improved w/ xanax.  Will order dose of xanax x 1 Prn IV morphine for pain  Noted recent CTA negative for PE as well as recent LE u/s + DVT on treatment dose lovenox  Discussed w/ nursing.  Ok to monitor for now  Reassess if there is any clinical decompensation.  ------------------------------------------------------------------- Pt evaluated at the bedside w/ noted audible wheezing  Discussed w/ granddaughter at the bedside  No known hx/o asthma/obstructive lung disease, though required duonebs during last admission  Will give IV solumedrol x 1 for obstructive lung disease component of resp status vs. Expanded treatment for PNA.  Prn duonebs  Will otherwise monitor resp status closely Granddaughter is agreeable to plan and expressed understanding  Plan of care also discussed w/ nursing at the bedside.

## 2022-08-05 NOTE — Assessment & Plan Note (Signed)
Restart allopurinol pending clearance by SLP

## 2022-08-05 NOTE — ED Notes (Signed)
ED TO INPATIENT HANDOFF REPORT  ED Nurse Name and Phone #: Vernice Jefferson [phone]  S Name/Age/Gender [name] 82 y.o. female Room/Bed: ED07A/ED07A  Code Status   Code Status: Full Code  Home/SNF/Other Home Patient oriented to: self, place, time, and situation Is this baseline? Yes   Triage Complete: Triage complete  Chief Complaint Pneumonia [J18.9]  Triage Note As per pt, pt woke up with shortness of breath. Pt denies COPD or other breathing complications. Pt denies using oxygen at home. As per duaghter, daughter thought it could be anxiety and gave pt xanax prior to arrival. Pt states it feels like she can't get a deep breath.  Daughter states pt was taken off blood thinner for KP last week.    Allergies Allergies  Allergen Reactions   Isoniazid Other (See Comments)    Blisters     Level of Care/Admitting Diagnosis ED Disposition     ED Disposition  Admit   Condition  --   Comment  Hospital Area: [place] [[id number]]  Level of Care: Med-Surg [16]  Covid Evaluation: Confirmed COVID Negative  Diagnosis: Pneumonia [227785]  Admitting Physician: Deneise Lever [[id number]]  Attending Physician: Deneise Lever [[id number]]          B Medical/Surgery History Past Medical History:  Diagnosis Date   Acute respiratory failure with hypoxia ([place]) 05/07/2022   Allergic rhinitis    Allergy    Anemia    Anemia    Barrett's esophagus    Blood dyscrasia    multiple myloma remission   Cancer ([place])    Change in bowel habits    Compression fracture 2013   Degenerative disc disease, lumbar    Dysrhythmia    palpitations   Esophageal reflux    Family history of adverse reaction to anesthesia    nausea -mom   GERD (gastroesophageal reflux disease)    Gout    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[place])    Heart palpitations    Hyperlipidemia    Hypothyroidism    Inflammatory polyarthropathy (HCC)    Lumbago    Lumbar radiculitis    Lumbar stenosis  with neurogenic claudication    Multiple myeloma (HCC)    Multiple myeloma (HCC)    Neuropathy    Bilateral Feet   Osteopenia    Osteoporosis    Other dysphagia    Palpitations    Pneumonia 04/2022   R/T COVID   Positive PPD    Renal insufficiency    Rheumatoid arthritis (HCC)    Rheumatoid arthritis (HCC)    Seasonal allergies    Tuberculosis 2011   Vertigo    Past Surgical History:  Procedure Laterality Date   ABDOMINAL HYSTERECTOMY     APPENDECTOMY     BACK SURGERY     BREAST EXCISIONAL BIOPSY Bilateral    neg   CATARACT EXTRACTION Bilateral    CHOLECYSTECTOMY     COLONOSCOPY WITH PROPOFOL N/A 11/12/2016   Procedure: COLONOSCOPY WITH PROPOFOL;  Surgeon: Manya Silvas, MD;  [facility];  Service: Endoscopy;  Laterality: N/A;   ESOPHAGOGASTRODUODENOSCOPY (EGD) WITH PROPOFOL N/A 11/12/2016   Procedure: ESOPHAGOGASTRODUODENOSCOPY (EGD) WITH PROPOFOL;  Surgeon: Manya Silvas, MD;  Location: [facility] ENDOSCOPY;  Service: Endoscopy;  Laterality: N/A;   ESOPHAGOGASTRODUODENOSCOPY (EGD) WITH PROPOFOL N/A 07/17/2022   Procedure: ESOPHAGOGASTRODUODENOSCOPY (EGD) WITH PROPOFOL;  Surgeon: Lesly Rubenstein, MD;  Location: [facility] ENDOSCOPY;  Service: Endoscopy;  Laterality: N/A;   FOOT SURGERY  KYPHOPLASTY N/A 07/29/2022   Procedure: KYPHOPLASTY THORACIC EIGHT, THORACIC NINE, THORACIC TEN;  Surgeon: Newman Pies, MD;  Location: [place];  Service: Neurosurgery;  Laterality: N/A;  3C   LUMBAR LAMINECTOMY/DECOMPRESSION MICRODISCECTOMY Left 06/18/2016   Procedure: LAMINECTOMY FOR FACET/SYNOVIAL CYST LUMBAR THREE - LUMBAR FOUR LEFT;  Surgeon: Newman Pies, MD;  Location: [place];  Service: Neurosurgery;  Laterality: Left;  LAMINECTOMY FOR FACET/SYNOVIAL CYST LUMBAR THREE - LUMBAR FOUR LEFT     A IV Location/Drains/Wounds Patient Lines/Drains/Airways Status     Active Line/Drains/Airways     Name Placement date Placement time Site Days   Peripheral IV 08/05/22 20  G 1" Left Antecubital 08/05/22  0827  Antecubital  less than 1   Peripheral IV 08/05/22 22 G 1" Anterior;Left Forearm 08/05/22  1101  Forearm  less than 1   Peripheral IV 08/05/22 22 G 1" Anterior;Right Forearm 08/05/22  1118  Forearm  less than 1            Intake/Output Last 24 hours No intake or output data in the 24 hours ending 08/05/22 1226  Labs/Imaging Results for orders placed or performed during the hospital encounter of 08/05/22 (from the past 48 hour(s))  Basic metabolic panel     Status: Abnormal   Collection Time: 08/05/22  8:18 AM  Result Value Ref Range   Sodium 137 135 - 145 mmol/L   Potassium 3.6 3.5 - 5.1 mmol/L   Chloride 103 98 - 111 mmol/L   CO2 24 22 - 32 mmol/L   Glucose, Bld 109 (H) 70 - 99 mg/dL    Comment: Glucose reference range applies only to samples taken after fasting for at least 8 hours.   BUN 13 8 - 23 mg/dL   Creatinine, Ser 1.00 0.44 - 1.00 mg/dL   Calcium 9.0 8.9 - 10.3 mg/dL   GFR, Estimated 56 (L) >60 mL/min    Comment: (NOTE) Calculated using the CKD-EPI Creatinine Equation (2021)    Anion gap 10 5 - 15    Comment: Performed at [facility], [address] [place], [place] [address]  CBC     Status: Abnormal   Collection Time: 08/05/22  8:18 AM  Result Value Ref Range   WBC 12.9 (H) 4.0 - 10.5 K/uL   RBC 3.06 (L) 3.87 - 5.11 MIL/uL   Hemoglobin 9.1 (L) 12.0 - 15.0 g/dL   HCT 30.7 (L) 36.0 - 46.0 %   MCV 100.3 (H) 80.0 - 100.0 fL   MCH 29.7 26.0 - 34.0 pg   MCHC 29.6 (L) 30.0 - 36.0 g/dL   RDW 16.7 (H) 11.5 - 15.5 %   Platelets 452 (H) 150 - 400 K/uL   nRBC 0.0 0.0 - 0.2 %    Comment: Performed at [facility], [place], [place] [address]  Troponin I (High Sensitivity)     Status: Abnormal   Collection Time: 08/05/22  8:18 AM  Result Value Ref Range   Troponin I (High Sensitivity) 19 (H) <18 ng/L    Comment: (NOTE) Elevated high sensitivity troponin I (hsTnI) values and significant   changes across serial measurements may suggest ACS but many other  chronic and acute conditions are known to elevate hsTnI results.  Refer to the "Links" section for chest pain algorithms and additional  guidance. Performed at [facility], [address], [place], [place] [address]   Troponin I (High Sensitivity)     Status: None   Collection Time: 08/05/22  11:00 AM  Result Value Ref Range   Troponin I (High Sensitivity) 17 <18 ng/L    Comment: (NOTE) Elevated high sensitivity troponin I (hsTnI) values and significant  changes across serial measurements may suggest ACS but many other  chronic and acute conditions are known to elevate hsTnI results.  Refer to the "Links" section for chest pain algorithms and additional  guidance. Performed at [facility], [address] [place], [place] [address]    CT Angio Chest PE W/Cm &/Or Wo Cm  Result Date: 08/05/2022 CLINICAL DATA:  Pulmonary embolism (PE) suspected, high prob. Shortness of breath. EXAM: CT ANGIOGRAPHY CHEST WITH CONTRAST TECHNIQUE: Multidetector CT imaging of the chest was performed using the standard protocol during bolus administration of intravenous contrast. Multiplanar CT image reconstructions and MIPs were obtained to evaluate the vascular anatomy. RADIATION DOSE REDUCTION: This exam was performed according to the departmental dose-optimization program which includes automated exposure control, adjustment of the mA and/or kV according to patient size and/or use of iterative reconstruction technique. CONTRAST:  49mL OMNIPAQUE IOHEXOL 350 MG/ML SOLN COMPARISON:  CTA chest 05/06/2022. FINDINGS: Cardiovascular: Satisfactory opacification of the pulmonary arteries to the segmental level. No evidence of pulmonary embolism. Streak artifact from vertebral augmentation cement in the lower lobes. Normal heart size. No pericardial effusion. Atherosclerotic calcifications of the aorta and coronary arteries.  Mediastinum/Nodes: No enlarged mediastinal, hilar, or axillary lymph nodes. Thyroid gland, trachea, and esophagus demonstrate no significant findings. Lungs/Pleura: Moderate right and small left pleural effusions with adjacent atelectasis in the lower lobes. Ground-glass and tree-in-bud opacities in the left lower lobe, suspicious for aspiration or infection. No consolidation. No pneumothorax. Upper Abdomen: No acute abnormality. Musculoskeletal: Interval vertebral augmentation at T8, T9 and T10. Unchanged degenerative Schmorl's node in the T7 superior endplate. Unchanged mild compression deformity at T12. Partially visualized changes from prior ACDF. Review of the MIP images confirms the above findings. IMPRESSION: 1. No evidence of pulmonary embolism. 2. Moderate right and small left pleural effusions with adjacent atelectasis in the lower lobes. 3. Ground-glass and tree-in-bud opacities in the left lower lobe, suspicious for aspiration or infection. 4. Aortic Atherosclerosis (ICD10-I70.0).  Coronary atherosclerosis. Electronically Signed   By: Emmit Alexanders M.D.   On: 08/05/2022 09:51   DG Chest 2 View  Result Date: 08/05/2022 CLINICAL DATA:  Shortness of breath EXAM: CHEST - 2 VIEW COMPARISON:  Chest x-ray dated May 09, 2022 FINDINGS: Cardiac and mediastinal contours are within normal limits. Elevation of the right hemidiaphragm. Right-greater-than-left basilar opacities, increased when compared with the prior no large pleural effusion or evidence of pneumothorax. IMPRESSION: Right-greater-than-left basilar opacities, increased when compared with the prior, concerning for infection or aspiration. Electronically Signed   By: Yetta Glassman M.D.   On: 08/05/2022 08:36    Pending Labs Unresulted Labs (From admission, onward)     Start     Ordered   08/06/22 0500  CBC  Tomorrow morning,   STAT        08/05/22 1215   08/06/22 0500  Comprehensive metabolic panel  Tomorrow morning,   STAT         08/05/22 1215   08/05/22 1227  Vitamin B12  (Anemia Panel (PNL))  Once,   R        08/05/22 1226   08/05/22 1227  Folate  (Anemia Panel (PNL))  Once,   R        08/05/22 1226   08/05/22 1227  Iron and TIBC  (Anemia Panel (PNL))  Once,   R        08/05/22 1226   08/05/22 1227  Ferritin  (Anemia Panel (PNL))  Once,   R        08/05/22 1226   08/05/22 1227  Reticulocytes  (Anemia Panel (PNL))  Once,   R        08/05/22 1226   08/05/22 1213  Strep pneumoniae urinary antigen  (COPD / Pneumonia / Cellulitis / Lower Extremity Wound)  Once,   URGENT        08/05/22 1215   08/05/22 1213  Legionella Pneumophila Serogp 1 Ur Ag  (COPD / Pneumonia / Cellulitis / Lower Extremity Wound)  Once,   URGENT        08/05/22 1215   08/05/22 1213  Expectorated Sputum Assessment w Gram Stain, Rflx to Resp Cult  (COPD / Pneumonia / Cellulitis / Lower Extremity Wound)  Once,   URGENT        08/05/22 1215   08/05/22 1030  Culture, blood (routine x 2)  BLOOD CULTURE X 2,   STAT      08/05/22 1032            Vitals/Pain Today's Vitals   08/05/22 1030 08/05/22 1100 08/05/22 1200 08/05/22 1201  BP: (!) 165/81 (!) 160/78 (!) 171/88   Pulse: 77 74 77   Resp: 17 16 (!) 21   Temp:    98.7 F (37.1 C)  TempSrc:    Oral  SpO2: 99% 100% 100%   Weight:      Height:      PainSc:        Isolation Precautions No active isolations  Medications Medications  azithromycin (ZITHROMAX) 500 mg in sodium chloride 0.9 % 250 mL IVPB (has no administration in time range)  metroNIDAZOLE (FLAGYL) IVPB 500 mg (500 mg Intravenous New Bag/Given 08/05/22 1136)  cefTRIAXone (ROCEPHIN) 2 g in sodium chloride 0.9 % 100 mL IVPB (has no administration in time range)  azithromycin (ZITHROMAX) 500 mg in sodium chloride 0.9 % 250 mL IVPB (has no administration in time range)  0.9 %  sodium chloride infusion (has no administration in time range)  ondansetron (ZOFRAN) tablet 4 mg (has no administration in time range)    Or   ondansetron (ZOFRAN) injection 4 mg (has no administration in time range)  iohexol (OMNIPAQUE) 350 MG/ML injection 75 mL (75 mLs Intravenous Contrast Given 08/05/22 0916)  cefTRIAXone (ROCEPHIN) 1 g in sodium chloride 0.9 % 100 mL IVPB (0 g Intravenous Stopped 08/05/22 1133)    Mobility walks with person assist     Focused Assessments Pulmonary Assessment Handoff:  Lung sounds: L Breath Sounds: Clear R Breath Sounds: Clear O2 Device: Nasal Cannula O2 Flow Rate (L/min): 2 L/min    R Recommendations: See Admitting Provider Note  Report given to:   Additional Notes:

## 2022-08-06 ENCOUNTER — Encounter: Payer: Self-pay | Admitting: Internal Medicine

## 2022-08-06 DIAGNOSIS — I700 Atherosclerosis of aorta: Secondary | ICD-10-CM | POA: Diagnosis present

## 2022-08-06 DIAGNOSIS — D649 Anemia, unspecified: Secondary | ICD-10-CM

## 2022-08-06 DIAGNOSIS — J309 Allergic rhinitis, unspecified: Secondary | ICD-10-CM | POA: Diagnosis present

## 2022-08-06 DIAGNOSIS — Z9889 Other specified postprocedural states: Secondary | ICD-10-CM

## 2022-08-06 DIAGNOSIS — M069 Rheumatoid arthritis, unspecified: Secondary | ICD-10-CM | POA: Diagnosis present

## 2022-08-06 DIAGNOSIS — R1319 Other dysphagia: Secondary | ICD-10-CM | POA: Diagnosis present

## 2022-08-06 DIAGNOSIS — M4854XA Collapsed vertebra, not elsewhere classified, thoracic region, initial encounter for fracture: Secondary | ICD-10-CM | POA: Diagnosis present

## 2022-08-06 DIAGNOSIS — J690 Pneumonitis due to inhalation of food and vomit: Secondary | ICD-10-CM | POA: Diagnosis present

## 2022-08-06 DIAGNOSIS — S22000G Wedge compression fracture of unspecified thoracic vertebra, subsequent encounter for fracture with delayed healing: Secondary | ICD-10-CM | POA: Diagnosis not present

## 2022-08-06 DIAGNOSIS — R7881 Bacteremia: Secondary | ICD-10-CM | POA: Diagnosis not present

## 2022-08-06 DIAGNOSIS — R109 Unspecified abdominal pain: Secondary | ICD-10-CM | POA: Diagnosis present

## 2022-08-06 DIAGNOSIS — N184 Chronic kidney disease, stage 4 (severe): Secondary | ICD-10-CM | POA: Diagnosis not present

## 2022-08-06 DIAGNOSIS — D509 Iron deficiency anemia, unspecified: Secondary | ICD-10-CM | POA: Diagnosis present

## 2022-08-06 DIAGNOSIS — J189 Pneumonia, unspecified organism: Secondary | ICD-10-CM | POA: Diagnosis present

## 2022-08-06 DIAGNOSIS — B9561 Methicillin susceptible Staphylococcus aureus infection as the cause of diseases classified elsewhere: Secondary | ICD-10-CM | POA: Diagnosis not present

## 2022-08-06 DIAGNOSIS — E039 Hypothyroidism, unspecified: Secondary | ICD-10-CM | POA: Diagnosis present

## 2022-08-06 DIAGNOSIS — M4854XG Collapsed vertebra, not elsewhere classified, thoracic region, subsequent encounter for fracture with delayed healing: Secondary | ICD-10-CM | POA: Diagnosis present

## 2022-08-06 DIAGNOSIS — J90 Pleural effusion, not elsewhere classified: Secondary | ICD-10-CM | POA: Diagnosis not present

## 2022-08-06 DIAGNOSIS — I82461 Acute embolism and thrombosis of right calf muscular vein: Secondary | ICD-10-CM | POA: Diagnosis present

## 2022-08-06 DIAGNOSIS — Z9484 Stem cells transplant status: Secondary | ICD-10-CM | POA: Diagnosis not present

## 2022-08-06 DIAGNOSIS — I129 Hypertensive chronic kidney disease with stage 1 through stage 4 chronic kidney disease, or unspecified chronic kidney disease: Secondary | ICD-10-CM | POA: Diagnosis present

## 2022-08-06 DIAGNOSIS — M4644 Discitis, unspecified, thoracic region: Secondary | ICD-10-CM | POA: Diagnosis not present

## 2022-08-06 DIAGNOSIS — E785 Hyperlipidemia, unspecified: Secondary | ICD-10-CM | POA: Diagnosis present

## 2022-08-06 DIAGNOSIS — C9000 Multiple myeloma not having achieved remission: Secondary | ICD-10-CM | POA: Diagnosis not present

## 2022-08-06 DIAGNOSIS — Z8616 Personal history of COVID-19: Secondary | ICD-10-CM | POA: Diagnosis not present

## 2022-08-06 DIAGNOSIS — Z1152 Encounter for screening for COVID-19: Secondary | ICD-10-CM | POA: Diagnosis not present

## 2022-08-06 DIAGNOSIS — J9601 Acute respiratory failure with hypoxia: Secondary | ICD-10-CM | POA: Diagnosis not present

## 2022-08-06 DIAGNOSIS — M810 Age-related osteoporosis without current pathological fracture: Secondary | ICD-10-CM | POA: Diagnosis present

## 2022-08-06 DIAGNOSIS — D631 Anemia in chronic kidney disease: Secondary | ICD-10-CM | POA: Diagnosis present

## 2022-08-06 DIAGNOSIS — M109 Gout, unspecified: Secondary | ICD-10-CM | POA: Diagnosis present

## 2022-08-06 DIAGNOSIS — K219 Gastro-esophageal reflux disease without esophagitis: Secondary | ICD-10-CM | POA: Diagnosis present

## 2022-08-06 DIAGNOSIS — K2270 Barrett's esophagus without dysplasia: Secondary | ICD-10-CM | POA: Diagnosis present

## 2022-08-06 DIAGNOSIS — M4624 Osteomyelitis of vertebra, thoracic region: Secondary | ICD-10-CM | POA: Diagnosis not present

## 2022-08-06 DIAGNOSIS — M4620 Osteomyelitis of vertebra, site unspecified: Secondary | ICD-10-CM | POA: Diagnosis not present

## 2022-08-06 DIAGNOSIS — N1832 Chronic kidney disease, stage 3b: Secondary | ICD-10-CM | POA: Diagnosis present

## 2022-08-06 LAB — BLOOD CULTURE ID PANEL (REFLEXED) - BCID2

## 2022-08-06 LAB — CBC
HCT: 26.9 % — ABNORMAL LOW (ref 36.0–46.0)
Hemoglobin: 8 g/dL — ABNORMAL LOW (ref 12.0–15.0)
MCH: 29.7 pg (ref 26.0–34.0)
MCHC: 29.7 g/dL — ABNORMAL LOW (ref 30.0–36.0)
MCV: 100 fL (ref 80.0–100.0)
Platelets: 346 10*3/uL (ref 150–400)
RBC: 2.69 MIL/uL — ABNORMAL LOW (ref 3.87–5.11)
RDW: 16.7 % — ABNORMAL HIGH (ref 11.5–15.5)
WBC: 11.7 10*3/uL — ABNORMAL HIGH (ref 4.0–10.5)
nRBC: 0 % (ref 0.0–0.2)

## 2022-08-06 LAB — RESPIRATORY PANEL BY PCR

## 2022-08-06 LAB — COMPREHENSIVE METABOLIC PANEL
ALT: 5 U/L (ref 0–44)
AST: 25 U/L (ref 15–41)
Albumin: 2.4 g/dL — ABNORMAL LOW (ref 3.5–5.0)
Alkaline Phosphatase: 51 U/L (ref 38–126)
Anion gap: 11 (ref 5–15)
BUN: 17 mg/dL (ref 8–23)
CO2: 22 mmol/L (ref 22–32)
Calcium: 7.8 mg/dL — ABNORMAL LOW (ref 8.9–10.3)
Chloride: 102 mmol/L (ref 98–111)
Creatinine, Ser: 1.07 mg/dL — ABNORMAL HIGH (ref 0.44–1.00)
GFR, Estimated: 52 mL/min — ABNORMAL LOW (ref >60)
Glucose, Bld: 122 mg/dL — ABNORMAL HIGH (ref 70–99)
Potassium: 4.5 mmol/L (ref 3.5–5.1)
Sodium: 135 mmol/L (ref 135–145)
Total Bilirubin: 0.5 mg/dL (ref 0.3–1.2)
Total Protein: 5.7 g/dL — ABNORMAL LOW (ref 6.5–8.1)

## 2022-08-06 LAB — SARS CORONAVIRUS 2 BY RT PCR: SARS Coronavirus 2 by RT PCR: NEGATIVE

## 2022-08-06 LAB — PROCALCITONIN: Procalcitonin: <0.1 ng/mL

## 2022-08-06 LAB — STREP PNEUMONIAE URINARY ANTIGEN: Strep Pneumo Urinary Antigen: NEGATIVE

## 2022-08-06 MED ORDER — ENOXAPARIN SODIUM 60 MG/0.6ML IJ SOSY
1.0000 mg/kg | PREFILLED_SYRINGE | Freq: Two times a day (BID) | INTRAMUSCULAR | Status: DC
  Administered 2022-08-06: 60 mg via SUBCUTANEOUS
  Filled 2022-08-06: qty 0.6

## 2022-08-06 MED ORDER — PREDNISONE 20 MG PO TABS
40.0000 mg | ORAL_TABLET | Freq: Every day | ORAL | Status: DC
  Administered 2022-08-06 – 2022-08-07 (×2): 40 mg via ORAL
  Filled 2022-08-06 (×2): qty 2

## 2022-08-06 MED ORDER — IPRATROPIUM-ALBUTEROL 0.5-2.5 (3) MG/3ML IN SOLN
3.0000 mL | Freq: Four times a day (QID) | RESPIRATORY_TRACT | Status: DC
  Administered 2022-08-06 – 2022-08-07 (×3): 3 mL via RESPIRATORY_TRACT
  Filled 2022-08-06 (×3): qty 3

## 2022-08-06 MED ORDER — SODIUM CHLORIDE 0.9 % IV SOLN
500.0000 mg | INTRAVENOUS | Status: DC
  Administered 2022-08-06 – 2022-08-07 (×2): 500 mg via INTRAVENOUS
  Filled 2022-08-06: qty 5
  Filled 2022-08-06: qty 500

## 2022-08-06 MED ORDER — CEFAZOLIN SODIUM-DEXTROSE 2-4 GM/100ML-% IV SOLN
2.0000 g | Freq: Two times a day (BID) | INTRAVENOUS | Status: DC
  Administered 2022-08-06 – 2022-08-07 (×3): 2 g via INTRAVENOUS
  Filled 2022-08-06 (×4): qty 100

## 2022-08-06 MED ORDER — IPRATROPIUM-ALBUTEROL 0.5-2.5 (3) MG/3ML IN SOLN: 3.0000 mL | Freq: Two times a day (BID) | RESPIRATORY_TRACT | Status: DC

## 2022-08-06 MED ORDER — CEFAZOLIN SODIUM-DEXTROSE 2-4 GM/100ML-% IV SOLN
2.0000 g | Freq: Three times a day (TID) | INTRAVENOUS | Status: AC
  Administered 2022-08-06: 2 g via INTRAVENOUS
  Filled 2022-08-06: qty 100

## 2022-08-06 MED ORDER — CEFAZOLIN SODIUM-DEXTROSE 2-4 GM/100ML-% IV SOLN
2.0000 g | Freq: Three times a day (TID) | INTRAVENOUS | Status: DC
  Filled 2022-08-06: qty 100

## 2022-08-06 MED ORDER — ALPRAZOLAM 0.25 MG PO TABS
0.2500 mg | ORAL_TABLET | Freq: Three times a day (TID) | ORAL | Status: DC | PRN
  Administered 2022-08-06 – 2022-08-11 (×9): 0.25 mg via ORAL
  Filled 2022-08-06 (×9): qty 1

## 2022-08-06 MED ORDER — SODIUM CHLORIDE 0.9 % IV SOLN
1.0000 g | INTRAVENOUS | Status: DC
  Administered 2022-08-06: 1 g via INTRAVENOUS
  Filled 2022-08-06: qty 1

## 2022-08-06 NOTE — Progress Notes (Signed)
  PROGRESS NOTE    Sydney Gillespie  [id number] DOB: 11-24-1940 DOA: 08/05/2022 PCP: [name], MD  [id number]  LOS: 1 day   Brief hospital course:   Assessment & Plan: Sydney Gillespie is a 82 y.o. female with medical history significant of multiple medical issues including Barrett's esophagus, compression fracture status post kyphoplasty March 20, hyperlipidemia, hypothyroidism, gout, stage IV CKD, DVT on Eliquis presenting with acute respiratory failure with hypoxia and aspiration pneumonia.  Patient noted to have been admitted recently for thoracic compression fractures with noted T8, T9 and T10 kyphoplasty and vertebral body biopsy by Dr. Arnoldo Morale on March 20.  Patient says overall stable pain since hospitalization.  Patient with significant increase shortness of breath over the past 12 to 24 hours.    Some remote history of dysphagia in the past per the grand daughter. O2 sats were dropping in to the upper 80s to low 90s at home.    * Pneumonia New onset resp failure w/ hypoxia w/ noted LLL PNA concerning for possible aspiration event  Started on rocephin, azithromycin, flagyl in ER  --covid and RVP neg --cont ceftriaxone and azithromycin --SLP eval cleared for diet  Acute respiratory failure with hypoxia (HCC) New O2 requirement in setting of LLL PNA on CT concerning for aspiration PNA.  No evidence of PE.  Respiratory distress may also be exacerbated by anxiety. --Continue supplemental O2 to keep sats >=90%, wean as tolerated --Xanax PRN while inpatient  CKD 3b Hx of CKD 4, now improved to 3b  Gout --cont allopurinol  Hypothyroidism --cont Synthroid  Iron deficiency anemia check anemia panel   Thoracic compression fracture, with delayed healing, subsequent encounter S/p T8, T9 and T10 kyphoplasty and vertebral body biopsy 07/29/2022 by Dr. Arnoldo Morale w/ NSG  Appears stable  Pain control  NSG c/s as clinically indicated.   Deep vein thrombosis (DVT)   --[place] found "Nonocclusive calf vein DVT involving the right posterior tibial vein", which is similar to finding back on 04/25/2022   DVT prophylaxis: Lovenox SQ Code Status: Full code  Family Communication:  Level of care: Med-Surg Dispo:   The patient is from: home Anticipated d/c is to: home Anticipated d/c date is: 2-3 days   Subjective and Interval History:  Pt reported breathing improved today.   Objective: Vitals:   08/06/22 1945 08/07/22 0426 08/07/22 0732 08/07/22 0737  BP: 127/65 130/80 (!) 143/83   Pulse: 90 76 79   Resp: 20 18 18    Temp: 98.6 F (37 C) 98.7 F (37.1 C) (!) 97.5 F (36.4 C)   TempSrc:  Oral    SpO2: 100% 100% 100% 98%  Weight:      Height:        Intake/Output Summary (Last 24 hours) at 08/07/2022 [id number] Last data filed at 08/07/2022 0448 Gross per 24 hour  Intake 450 ml  Output 300 ml  Net 150 ml   Filed Weights   08/05/22 0805  Weight: 59.9 kg    Examination:   Constitutional: NAD, AAOx3 HEENT: conjunctivae and lids normal, EOMI CV: No cyanosis.   RESP: normal respiratory effort, on 2L Neuro: II - XII grossly intact.   Psych: Normal mood and affect.  Appropriate judgement and reason   Data Reviewed: I have personally reviewed labs and imaging studies  Time spent: 50 minutes  Enzo Bi, MD [facility] If 7PM-7AM, please contact night-coverage 08/07/2022, 8:22 AM

## 2022-08-06 NOTE — Consult Note (Signed)
NAME: Sydney Gillespie  DOB: 01-12-1941  MRN: [id number]  Date/Time: 08/06/2022 4:07 PM  Dr.Lai MSSA bacteremia Subjective:  ? Sydney Gillespie is a 82 y.o. 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 Pt was in bed when she developed sudden tightness lower chest and abdomen like a cramp- she could not breath deeply and called her granddaughter who called EMS Pt had undergone kyphoplasty T8-T9 T10 on 07/29/22. No discharge or worsening pain at the site In the ED vitals  08/05/22  BP 141/72 !  Temp 97.6 F (36.4 C)  Pulse Rate 81  Resp 18  SpO2 100 %  O2 Flow Rate (L/min) 4 L/min (S) [1]    Latest Reference Range & Units 08/05/22  WBC 4.0 - 10.5 K/uL 12.9 (H)  Hemoglobin 12.0 - 15.0 g/dL 9.1 (L)  HCT 36.0 - 46.0 % 30.7 (L)  Platelets 150 - 400 K/uL 452 (H)  Creatinine 0.44 - 1.00 mg/dL 1.00  CTA r/o PE [place] leg rt side non occlusive thrombus of rt post tibial vein CXR was concerning for b/l basilar opacities Covid, resp panel neg She was started on IV ceftriaxone and azithromycin I am seeing the patient as blood culture 2/2 positive for MSSA  Past Medical History:  Diagnosis Date   Acute respiratory failure with hypoxia ([facility]) 05/07/2022   Allergic rhinitis    Allergy    Anemia    Anemia    Barrett's esophagus    Blood dyscrasia    multiple myloma remission   Cancer (HCC)    Change in bowel habits    Compression fracture 2013   Degenerative disc disease, lumbar    Dysrhythmia    palpitations   Esophageal reflux    Family history of adverse reaction to anesthesia    nausea -mom   GERD (gastroesophageal reflux disease)    Gout    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[place])    Heart  palpitations    Hyperlipidemia    Hypothyroidism    Inflammatory polyarthropathy (HCC)    Lumbago    Lumbar radiculitis    Lumbar stenosis with neurogenic claudication    Multiple myeloma (HCC)    Multiple myeloma (HCC)    Neuropathy    Bilateral Feet   Osteopenia    Osteoporosis    Other dysphagia    Palpitations    Pneumonia 04/2022   R/T COVID   Positive PPD    Renal insufficiency    Rheumatoid arthritis ([facility])    Rheumatoid arthritis ([place])    Seasonal allergies    Tuberculosis 2011   Vertigo    Treated for positive PPD Past Surgical History:  Procedure Laterality Date   ABDOMINAL HYSTERECTOMY     APPENDECTOMY     BACK SURGERY     BREAST EXCISIONAL BIOPSY Bilateral    neg   CATARACT EXTRACTION Bilateral    CHOLECYSTECTOMY     COLONOSCOPY WITH PROPOFOL N/A 11/12/2016   Procedure: COLONOSCOPY WITH PROPOFOL;  Surgeon: Manya Silvas, MD;  Location: [facility] ENDOSCOPY;  Service: Endoscopy;  Laterality: N/A;   ESOPHAGOGASTRODUODENOSCOPY (EGD) WITH PROPOFOL N/A 11/12/2016   Procedure: ESOPHAGOGASTRODUODENOSCOPY (EGD) WITH PROPOFOL;  Surgeon: Manya Silvas, MD;  Location: [facility] ENDOSCOPY;  Service: Endoscopy;  Laterality: N/A;   ESOPHAGOGASTRODUODENOSCOPY (EGD)  WITH PROPOFOL N/A 07/17/2022   Procedure: ESOPHAGOGASTRODUODENOSCOPY (EGD) WITH PROPOFOL;  Surgeon: Lesly Rubenstein, MD;  Location: [facility] ENDOSCOPY;  Service: Endoscopy;  Laterality: N/A;   FOOT SURGERY     KYPHOPLASTY N/A 07/29/2022   Procedure: KYPHOPLASTY THORACIC EIGHT, THORACIC NINE, THORACIC TEN;  Surgeon: Newman Pies, MD;  Location: [place];  Service: Neurosurgery;  Laterality: N/A;  3C   LUMBAR LAMINECTOMY/DECOMPRESSION MICRODISCECTOMY Left 06/18/2016   Procedure: LAMINECTOMY FOR FACET/SYNOVIAL CYST LUMBAR THREE - LUMBAR FOUR LEFT;  Surgeon: Newman Pies, MD;  Location: [place];  Service: Neurosurgery;  Laterality: Left;  LAMINECTOMY FOR FACET/SYNOVIAL CYST LUMBAR THREE - LUMBAR FOUR LEFT    Social  History   Socioeconomic History   Marital status: Widowed    Spouse name: Not on file   Number of children: Not on file   Years of education: Not on file   Highest education level: Not on file  Occupational History   Not on file  Tobacco Use   Smoking status: Never   Smokeless tobacco: Never  Vaping Use   Vaping Use: Never used  Substance and Sexual Activity   Alcohol use: No   Drug use: No   Sexual activity: Yes  Other Topics Concern   Not on file  Social History Narrative   Not on file   Social Determinants of Health   Financial Resource Strain: Not on file  Food Insecurity: No Food Insecurity (08/05/2022)   Hunger Vital Sign    Worried About Running Out of Food in the Last Year: Never true    Ran Out of Food in the Last Year: Never true  Transportation Needs: No Transportation Needs (08/05/2022)   PRAPARE - Hydrologist (Medical): No    Lack of Transportation (Non-Medical): No  Physical Activity: Not on file  Stress: Not on file  Social Connections: Not on file  Intimate Partner Violence: Not At Risk (08/05/2022)   Humiliation, Afraid, Rape, and Kick questionnaire    Fear of Current or Ex-Partner: No    Emotionally Abused: No    Physically Abused: No    Sexually Abused: No    Family History  Problem Relation Age of Onset   Hypertension Mother    Heart attack Mother    Rheum arthritis Mother    Osteoarthritis Mother    Cancer Father    Hypertension Father    Stroke Father    Breast cancer Maternal Grandmother 40   Osteoarthritis Other    Rheum arthritis Other    Allergies  Allergen Reactions   Isoniazid Other (See Comments)    Blisters    I? Current Facility-Administered Medications  Medication Dose Route Frequency Provider Last Rate Last Admin   ALPRAZolam Duanne Moron) tablet 0.25 mg  0.25 mg Oral TID PRN Enzo Bi, MD       azithromycin (ZITHROMAX) 500 mg in sodium chloride 0.9 % 250 mL IVPB  500 mg Intravenous Q24H Wynelle Cleveland, RPH   Stopped at 08/06/22 1347   cefTRIAXone (ROCEPHIN) 1 g in sodium chloride 0.9 % 100 mL IVPB  1 g Intravenous Q24H Wynelle Cleveland, RPH   Stopped at 08/06/22 1548   enoxaparin (LOVENOX) injection 60 mg  1 mg/kg Subcutaneous Q12H Wynelle Cleveland, RPH       ipratropium-albuterol (DUONEB) 0.5-2.5 (3) MG/3ML nebulizer solution 3 mL  3 mL Nebulization Q2H PRN Deneise Lever, MD   3 mL at 08/05/22 1740   ipratropium-albuterol (DUONEB) 0.5-2.5 (3)  MG/3ML nebulizer solution 3 mL  3 mL Nebulization QID Enzo Bi, MD   3 mL at 08/06/22 1532   ondansetron (ZOFRAN) tablet 4 mg  4 mg Oral Q6H PRN Deneise Lever, MD       Or   ondansetron [facility]) injection 4 mg  4 mg Intravenous Q6H PRN Deneise Lever, MD       predniSONE (DELTASONE) tablet 40 mg  40 mg Oral Q breakfast Wynelle Cleveland, RPH   40 mg at 08/06/22 1550     Abtx:  Anti-infectives (From admission, onward)    Start     Dose/Rate Route Frequency Ordered Stop   08/06/22 1300  azithromycin (ZITHROMAX) 500 mg in sodium chloride 0.9 % 250 mL IVPB  Status:  Discontinued        500 mg 250 mL/hr over 60 Minutes Intravenous Every 24 hours 08/05/22 1423 08/06/22 0315   08/06/22 1300  ceFAZolin (ANCEF) IVPB 2g/100 mL premix  Status:  Discontinued        2 g 200 mL/hr over 30 Minutes Intravenous Every 8 hours 08/06/22 0710 08/06/22 0836   08/06/22 1300  cefTRIAXone (ROCEPHIN) 1 g in sodium chloride 0.9 % 100 mL IVPB        1 g 200 mL/hr over 30 Minutes Intravenous Every 24 hours 08/06/22 0836     08/06/22 1200  azithromycin (ZITHROMAX) 500 mg in sodium chloride 0.9 % 250 mL IVPB        500 mg 250 mL/hr over 60 Minutes Intravenous Every 24 hours 08/06/22 0836     08/06/22 1100  azithromycin (ZITHROMAX) 500 mg in sodium chloride 0.9 % 250 mL IVPB  Status:  Discontinued        500 mg 250 mL/hr over 60 Minutes Intravenous Every 24 hours 08/05/22 1215 08/05/22 1325   08/06/22 1000  cefTRIAXone (ROCEPHIN) 2 g in sodium  chloride 0.9 % 100 mL IVPB  Status:  Discontinued        2 g 200 mL/hr over 30 Minutes Intravenous Every 24 hours 08/05/22 1325 08/06/22 0316   08/06/22 0700  cefTRIAXone (ROCEPHIN) 2 g in sodium chloride 0.9 % 100 mL IVPB  Status:  Discontinued        2 g 200 mL/hr over 30 Minutes Intravenous Every 24 hours 08/05/22 1215 08/05/22 1325   08/06/22 0415  ceFAZolin (ANCEF) IVPB 2g/100 mL premix        2 g 200 mL/hr over 30 Minutes Intravenous Every 8 hours 08/06/22 0317 08/06/22 0602   08/06/22 0000  metroNIDAZOLE (FLAGYL) IVPB 500 mg  Status:  Discontinued        500 mg 100 mL/hr over 60 Minutes Intravenous Every 12 hours 08/05/22 2225 08/06/22 0316   08/05/22 1500  azithromycin (ZITHROMAX) 500 mg in sodium chloride 0.9 % 250 mL IVPB  Status:  Discontinued        500 mg 250 mL/hr over 60 Minutes Intravenous Every 24 hours 08/05/22 1325 08/05/22 1423   08/05/22 1045  cefTRIAXone (ROCEPHIN) 1 g in sodium chloride 0.9 % 100 mL IVPB        1 g 200 mL/hr over 30 Minutes Intravenous  Once 08/05/22 1032 08/05/22 1133   08/05/22 1045  azithromycin (ZITHROMAX) 500 mg in sodium chloride 0.9 % 250 mL IVPB  Status:  Discontinued        500 mg 250 mL/hr over 60 Minutes Intravenous  Once 08/05/22 1032 08/05/22 1357   08/05/22 1045  metroNIDAZOLE (FLAGYL) IVPB  500 mg        500 mg 100 mL/hr over 60 Minutes Intravenous  Once 08/05/22 1032 08/05/22 1235       REVIEW OF SYSTEMS:  Const: negative fever, negative chills, negative weight loss Eyes: negative diplopia or visual changes, negative eye pain ENT: negative coryza, negative sore throat Resp: as above Cards: negative for chest pain, palpitations, lower extremity edema GU: negative for frequency, dysuria and hematuria GI: Negative for abdominal pain, diarrhea, bleeding, constipation Skin: negative for rash and pruritus Heme:  easy bruising  MS: weakness Back pain Neurolo:negative for headaches, dizziness, vertigo, memory problems  Psych:  negative for feelings of anxiety, depression  Endocrine: negative for thyroid, diabetes Allergy/Immunology- isoniazid- blister Objective:  VITALS:  BP 132/66 (BP Location: Right Arm)   Pulse 96   Temp 98.1 F (36.7 C)   Resp 18   Ht 5\' 5"  (1.651 m)   Wt 59.9 kg   SpO2 100%   BMI 21.97 kg/m   PHYSICAL EXAM:  General: Alert, cooperative, no distress, appears stated age.  Head: Normocephalic, without obvious abnormality, atraumatic. Eyes: Conjunctivae clear, anicteric sclerae. Pupils are equal ENT Nares normal. No drainage or sinus tenderness. Lips, mucosa, and tongue normal. No Thrush Neck: Supple, symmetrical, no adenopathy, thyroid: non tender no carotid bruit and no JVD. Back: thoracic spine site of kyphoplasty covered with steristrips Lungs: decreased air entry rt base Heart: Regular rate and rhythm, no murmur, rub or gallop. Abdomen: Soft, non-tender,not distended. Bowel sounds normal. No masses Extremities: bruisng over geet Skin: No rashes or lesions. Or bruising Lymph: Cervical, supraclavicular normal. Neurologic: Grossly non-focal Pertinent Labs Lab Results CBC    Latest Ref Rng & Units 08/06/2022    7:14 AM 08/05/2022    8:18 AM 07/20/2022    9:29 AM  CBC  WBC 4.0 - 10.5 K/uL 11.7  12.9  12.3   Hemoglobin 12.0 - 15.0 g/dL 8.0  9.1  9.3   Hematocrit 36.0 - 46.0 % 26.9  30.7  31.0   Platelets 150 - 400 K/uL 346  452  427          Latest Ref Rng & Units 08/06/2022    7:14 AM 08/05/2022    8:18 AM 07/20/2022    9:29 AM  CMP  Glucose 70 - 99 mg/dL 122  109  110   BUN 8 - 23 mg/dL 17  13  15    Creatinine 0.44 - 1.00 mg/dL 1.07  1.00  1.39   Sodium 135 - 145 mmol/L 135  137  133   Potassium 3.5 - 5.1 mmol/L 4.5  3.6  4.0   Chloride 98 - 111 mmol/L 102  103  98   CO2 22 - 32 mmol/L 22  24  24    Calcium 8.9 - 10.3 mg/dL 7.8  9.0  9.3   Total Protein 6.5 - 8.1 g/dL 5.7   7.2   Total Bilirubin 0.3 - 1.2 mg/dL 0.5   0.5   Alkaline Phos 38 - 126 U/L 51   68   AST  15 - 41 U/L 25   23   ALT 0 - 44 U/L 5   7       Microbiology: Recent Results (from the past 240 hour(s))  Surgical pcr screen     Status: None   Collection Time: 07/28/22  8:13 AM   Specimen: Nasal Mucosa; Nasal Swab  Result Value Ref Range Status   MRSA, PCR NEGATIVE NEGATIVE Final  Staphylococcus aureus NEGATIVE NEGATIVE Final    Comment: (NOTE) The Xpert SA Assay (FDA approved for NASAL specimens in patients 28 years of age and older), is one component of a comprehensive surveillance program. It is not intended to diagnose infection nor to guide or monitor treatment. Performed at [facility], [place] [address], [place], [place] [address]   Culture, blood (routine x 2)     Status: None (Preliminary result)   Collection Time: 08/05/22 11:00 AM   Specimen: BLOOD  Result Value Ref Range Status   Specimen Description BLOOD  LEFT FORE ARM  Final   Special Requests NONE  Final   Culture  Setup Time   Final    GRAM POSITIVE COCCI IN BOTH AEROBIC AND ANAEROBIC BOTTLES Organism ID to follow CRITICAL RESULT CALLED TO, READ BACK BY AND VERIFIED WITH: JASON [email or web address]  08/06/22 RH Performed at [facility], [address] [place], [place] [address]    Culture GRAM POSITIVE COCCI  Final   Report Status PENDING  Incomplete  Blood Culture ID Panel (Reflexed)     Status: Abnormal   Collection Time: 08/05/22 11:00 AM  Result Value Ref Range Status   Enterococcus faecalis NOT DETECTED NOT DETECTED Final   Enterococcus Faecium NOT DETECTED NOT DETECTED Final   Listeria monocytogenes NOT DETECTED NOT DETECTED Final   Staphylococcus species DETECTED (A) NOT DETECTED Final    Comment: CRITICAL RESULT CALLED TO, READ BACK BY AND VERIFIED WITH: JASON [email or web address]  08/06/22 RH    Staphylococcus aureus (BCID) DETECTED (A) NOT DETECTED Final    Comment: CRITICAL RESULT CALLED TO, READ BACK BY AND VERIFIED WITH: JASON [email or web address]  08/06/22 RH    Staphylococcus epidermidis NOT  DETECTED NOT DETECTED Final   Staphylococcus lugdunensis NOT DETECTED NOT DETECTED Final   Streptococcus species NOT DETECTED NOT DETECTED Final   Streptococcus agalactiae NOT DETECTED NOT DETECTED Final   Streptococcus pneumoniae NOT DETECTED NOT DETECTED Final   Streptococcus pyogenes NOT DETECTED NOT DETECTED Final   A.calcoaceticus-baumannii NOT DETECTED NOT DETECTED Final   Bacteroides fragilis NOT DETECTED NOT DETECTED Final   Enterobacterales NOT DETECTED NOT DETECTED Final   Enterobacter cloacae complex NOT DETECTED NOT DETECTED Final   Escherichia coli NOT DETECTED NOT DETECTED Final   Klebsiella aerogenes NOT DETECTED NOT DETECTED Final   Klebsiella oxytoca NOT DETECTED NOT DETECTED Final   Klebsiella pneumoniae NOT DETECTED NOT DETECTED Final   Proteus species NOT DETECTED NOT DETECTED Final   Salmonella species NOT DETECTED NOT DETECTED Final   Serratia marcescens NOT DETECTED NOT DETECTED Final   Haemophilus influenzae NOT DETECTED NOT DETECTED Final   Neisseria meningitidis NOT DETECTED NOT DETECTED Final   Pseudomonas aeruginosa NOT DETECTED NOT DETECTED Final   Stenotrophomonas maltophilia NOT DETECTED NOT DETECTED Final   Candida albicans NOT DETECTED NOT DETECTED Final   Candida auris NOT DETECTED NOT DETECTED Final   Candida glabrata NOT DETECTED NOT DETECTED Final   Candida krusei NOT DETECTED NOT DETECTED Final   Candida parapsilosis NOT DETECTED NOT DETECTED Final   Candida tropicalis NOT DETECTED NOT DETECTED Final   Cryptococcus neoformans/gattii NOT DETECTED NOT DETECTED Final   Meth resistant mecA/C and MREJ NOT DETECTED NOT DETECTED Final    Comment: Performed at [facility], [address] [place], [place] [address]  Culture, blood (routine x 2)     Status: None (Preliminary result)   Collection Time: 08/05/22 11:18 AM   Specimen: BLOOD RIGHT FOREARM  Result Value Ref Range Status  Specimen Description   Final    BLOOD RIGHT  FOREARM Performed at [facility], [place] [address], [place], [place] [address]    Special Requests   Final    BOTTLES DRAWN AEROBIC AND ANAEROBIC Blood Culture adequate volume   Culture  Setup Time   Final    GRAM POSITIVE COCCI ANAEROBIC BOTTLE ONLY CRITICAL VALUE NOTED.  VALUE IS CONSISTENT WITH PREVIOUSLY REPORTED AND CALLED VALUE. Performed at [facility], [address] [place], [place] [address]    Culture [facility] POSITIVE COCCI  Final   Report Status PENDING  Incomplete  Respiratory (~20 pathogens) panel by PCR     Status: None   Collection Time: 08/05/22 11:14 PM   Specimen: Nasopharyngeal Swab; Respiratory  Result Value Ref Range Status   Adenovirus NOT DETECTED NOT DETECTED Final   Coronavirus 229E NOT DETECTED NOT DETECTED Final    Comment: (NOTE) The Coronavirus on the Respiratory Panel, DOES NOT test for the novel  Coronavirus (2019 nCoV)    Coronavirus HKU1 NOT DETECTED NOT DETECTED Final   Coronavirus NL63 NOT DETECTED NOT DETECTED Final   Coronavirus OC43 NOT DETECTED NOT DETECTED Final   Metapneumovirus NOT DETECTED NOT DETECTED Final   Rhinovirus / Enterovirus NOT DETECTED NOT DETECTED Final   Influenza A NOT DETECTED NOT DETECTED Final   Influenza B NOT DETECTED NOT DETECTED Final   Parainfluenza Virus 1 NOT DETECTED NOT DETECTED Final   Parainfluenza Virus 2 NOT DETECTED NOT DETECTED Final   Parainfluenza Virus 3 NOT DETECTED NOT DETECTED Final   Parainfluenza Virus 4 NOT DETECTED NOT DETECTED Final   Respiratory Syncytial Virus NOT DETECTED NOT DETECTED Final   Bordetella pertussis NOT DETECTED NOT DETECTED Final   Bordetella Parapertussis NOT DETECTED NOT DETECTED Final   Chlamydophila pneumoniae NOT DETECTED NOT DETECTED Final   Mycoplasma pneumoniae NOT DETECTED NOT DETECTED Final    Comment: Performed at [facility], [address] [place], [place] [address]  SARS Coronavirus 2 by RT PCR (hospital order, performed in [facility]  hospital lab) *cepheid single result test* Anterior Nasal Swab     Status: None   Collection Time: 08/06/22 11:51 AM   Specimen: Anterior Nasal Swab  Result Value Ref Range Status   SARS Coronavirus 2 by RT PCR NEGATIVE NEGATIVE Final    Comment: (NOTE) SARS-CoV-2 target nucleic acids are NOT DETECTED.  The SARS-CoV-2 RNA is generally detectable in upper and lower respiratory specimens during the acute phase of infection. The lowest concentration of SARS-CoV-2 viral copies this assay can detect is 250 copies / mL. A negative result does not preclude SARS-CoV-2 infection and should not be used as the sole basis for treatment or other patient management decisions.  A negative result may occur with improper specimen collection / handling, submission of specimen other than nasopharyngeal swab, presence of viral mutation(s) within the areas targeted by this assay, and inadequate number of viral copies (<250 copies / mL). A negative result must be combined with clinical observations, patient history, and epidemiological information.  Fact Sheet for Patients:   https://www.patel.info/  Fact Sheet for Healthcare Providers: https://hall.com/  This test is not yet approved or  cleared by the [place] FDA and has been authorized for detection and/or diagnosis of SARS-CoV-2 by FDA under an Emergency Use Authorization (EUA).  This EUA will remain in effect (meaning this test can be used) for the duration of the COVID-19 declaration under Section 564(b)(1) of the Act, 21  U.S.C. section 360bbb-3(b)(1), unless the authorization is terminated or revoked sooner.  Performed at [facility], [address] [place], [place] [address]     IMAGING RESULTS:  I have personally reviewed the films ?rt pleural effusion Tree in bud appearance left lower lobe  Impression/Recommendation ?pt presenting with acute SOB  MSSA bacteremia Need to  look for source Need to repeat blood culture Need 2 d echo Will need TEE to look for vegetation if 2 d echo unrevealing Will  need MRI Thoracic spine Recommend rt thoracentesis and sending for cell count, chemistry and culture start cefazolin DC ceftriaxone Can Dc zithromax  Esophageal dysmotility with risk for aspiration'  GGO and tree in bud left lower lung concerning for aspiration pneumoniits Procal < 0.10 so doubt it is pneumonia  Rheumatoid arthritis on plaquenil  Multiple myeloma s/p SCT Chemo on hold since she had covid Dec 2023   Recent kyphoplasty for Thoracic spine compression fractures  Anemia  CKD ? ___________________________________________________ Discussed with patient in detail Note:  This document was prepared using Dragon voice recognition software and may include unintentional dictation errors.

## 2022-08-06 NOTE — Evaluation (Signed)
Clinical/Bedside Swallow Evaluation Patient Details  Name: Sydney Gillespie MRN: [id number] Date of Birth: 24-Nov-1940  Today's Date: 08/06/2022 Time: SLP Start Time (ACUTE ONLY): [id number] SLP Stop Time (ACUTE ONLY): 1200 SLP Time Calculation (min) (ACUTE ONLY): 40 min  Past Medical History:  Past Medical History:  Diagnosis Date   Acute respiratory failure with hypoxia ([place]) 05/07/2022   Allergic rhinitis    Allergy    Anemia    Anemia    Barrett's esophagus    Blood dyscrasia    multiple myloma remission   Cancer ([place])    Change in bowel habits    Compression fracture 2013   Degenerative disc disease, lumbar    Dysrhythmia    palpitations   Esophageal reflux    Family history of adverse reaction to anesthesia    nausea -mom   GERD (gastroesophageal reflux disease)    Gout    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[place])    Heart palpitations    Hyperlipidemia    Hypothyroidism    Inflammatory polyarthropathy (HCC)    Lumbago    Lumbar radiculitis    Lumbar stenosis with neurogenic claudication    Multiple myeloma (HCC)    Multiple myeloma (HCC)    Neuropathy    Bilateral Feet   Osteopenia    Osteoporosis    Other dysphagia    Palpitations    Pneumonia 04/2022   R/T COVID   Positive PPD    Renal insufficiency    Rheumatoid arthritis (HCC)    Rheumatoid arthritis (HCC)    Seasonal allergies    Tuberculosis 2011   Vertigo    Past Surgical History:  Past Surgical History:  Procedure Laterality Date   ABDOMINAL HYSTERECTOMY     APPENDECTOMY     BACK SURGERY     BREAST EXCISIONAL BIOPSY Bilateral    neg   CATARACT EXTRACTION Bilateral    CHOLECYSTECTOMY     COLONOSCOPY WITH PROPOFOL N/A 11/12/2016   Procedure: COLONOSCOPY WITH PROPOFOL;  Surgeon: Manya Silvas, MD;  Location: [facility] ENDOSCOPY;  Service: Endoscopy;  Laterality: N/A;   ESOPHAGOGASTRODUODENOSCOPY (EGD) WITH PROPOFOL N/A 11/12/2016   Procedure: ESOPHAGOGASTRODUODENOSCOPY (EGD) WITH PROPOFOL;  Surgeon:  Manya Silvas, MD;  Location: [facility] ENDOSCOPY;  Service: Endoscopy;  Laterality: N/A;   ESOPHAGOGASTRODUODENOSCOPY (EGD) WITH PROPOFOL N/A 07/17/2022   Procedure: ESOPHAGOGASTRODUODENOSCOPY (EGD) WITH PROPOFOL;  Surgeon: Lesly Rubenstein, MD;  Location: [facility] ENDOSCOPY;  Service: Endoscopy;  Laterality: N/A;   FOOT SURGERY     KYPHOPLASTY N/A 07/29/2022   Procedure: KYPHOPLASTY THORACIC EIGHT, THORACIC NINE, THORACIC TEN;  Surgeon: Newman Pies, MD;  Location: [facility];  Service: Neurosurgery;  Laterality: N/A;  3C   LUMBAR LAMINECTOMY/DECOMPRESSION MICRODISCECTOMY Left 06/18/2016   Procedure: LAMINECTOMY FOR FACET/SYNOVIAL CYST LUMBAR THREE - LUMBAR FOUR LEFT;  Surgeon: Newman Pies, MD;  Location: [facility];  Service: Neurosurgery;  Laterality: Left;  LAMINECTOMY FOR FACET/SYNOVIAL CYST LUMBAR THREE - LUMBAR FOUR LEFT   HPI:  82yo female admitted 08/05/22 with SOB, acute respiratory failure with hypoxia and aspiration PNA. CXR - Right-greater-than-left basilar opacities, increased when compared  with the prior, concerning for infection or aspiration. PMH: Barrett's esophagus, GERD, dysphagia, RA, PNA/COVID, compression fx s/p kyphoplasty, HLD, hypothyroidism, gout, CKD4, DVT, cancer (multiple myeloma remission), DDD.    Assessment / Plan / Recommendation  Clinical Impression  Pt seen at bedside for assessment of swallow function and safety, as well as identification of appropriateness for PO intake and diet recommendations. RN provided consent for  assessment and PO intake. Pt had a friend present, and gave permission for her to stay during eval. Pt was awake, alert, requesting water/food per RN. Pt was seated upright during all po intake. CN exam unremarkable.Voice quality is clear. Cough is weak because it causes her pain. Pt has upper and lower dentures in place. She reports they are ill fitting due to 40 pound weight loss. She was encouraged to use adhesive to improve fit. Pt appears to  tolerate ice chips, thin liquid, puree, and solid textures without overt s/s aspiration. She is cautious, taking small bites and sips at a slow rate. Pt reports globus sensation, and  confirms Barrett's esophagus and GERD. At this time, recommend regular diet and thin liquids, meds as tolerated. SLP provided extensive education throughout this evaluation, and posted safe swallow precautions including information discussed, including esophageal precautions. Pt and friend were receptive to education. Medical team updated regarding SLP results and recommendations. SLP will continue to follow acutely for ongoing education, and assessment of diet tolerance. SLP Visit Diagnosis: Dysphagia, unspecified (R13.10)    Aspiration Risk  Mild aspiration risk    Diet Recommendation Regular;Thin liquid   Liquid Administration via: Cup;Straw Medication Administration:  (as tolerated) Supervision: Patient able to self feed Compensations: Minimize environmental distractions;Slow rate;Small sips/bites;Follow solids with liquid Postural Changes: Remain upright for at least 30 minutes after po intake;Seated upright at 90 degrees    Other  Recommendations Oral Care Recommendations: Oral care BID    Recommendations for follow up therapy are one component of a multi-disciplinary discharge planning process, led by the attending physician.  Recommendations may be updated based on patient status, additional functional criteria and insurance authorization.  Follow up Recommendations Follow physician's recommendations for discharge plan and follow up therapies      Assistance Recommended at Discharge    Functional Status Assessment Patient has not had a recent decline in their functional status  Frequency and Duration min 1 x/week  1 week;2 weeks       Prognosis Prognosis for improved oropharyngeal function: Good      Swallow Study   General Date of Onset: 08/05/22 HPI: 81yo female admitted 08/05/22 with SOB,  acute respiratory failure with hypoxia and aspiration PNA. CXR - Right-greater-than-left basilar opacities, increased when compared  with the prior, concerning for infection or aspiration. PMH: Barrett's esophagus, GERD, dysphagia, RA, PNA/COVID, compression fx s/p kyphoplasty, HLD, hypothyroidism, gout, CKD4, DVT, cancer (multiple myeloma remission), DDD. Type of Study: Bedside Swallow Evaluation Previous Swallow Assessment: BSE 04/26/2022 - reg/thin, no follow up Diet Prior to this Study: NPO Temperature Spikes Noted: No Respiratory Status: Nasal cannula History of Recent Intubation: No Behavior/Cognition: Alert;Cooperative;Pleasant mood Oral Cavity Assessment: Within Functional Limits Oral Cavity - Dentition: Dentures, top;Dentures, bottom Vision: Functional for self-feeding Self-Feeding Abilities: Able to feed self Patient Positioning: Upright in bed Baseline Vocal Quality: Normal Volitional Cough: Weak Volitional Swallow: Able to elicit    Oral/Motor/Sensory Function Overall Oral Motor/Sensory Function: Within functional limits   Ice Chips Ice chips: Within functional limits Presentation: Spoon   Thin Liquid Thin Liquid: Within functional limits Presentation: Cup;Straw;Self Fed    Nectar Thick Nectar Thick Liquid: Not tested   Honey Thick Honey Thick Liquid: Not tested   Puree Puree: Within functional limits Presentation: Self Fed;Spoon   Solid     Solid: Within functional limits Presentation: Elberon B. Quentin Ore, [facility], [place] Speech Language Pathologist  Shonna Chock 08/06/2022,12:19 PM

## 2022-08-06 NOTE — Consult Note (Signed)
Pharmacy Antibiotic Note  Sydney Gillespie is a 82 y.o. female admitted on 08/05/2022 with SOB. 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 Originally started on ceftriaxone +azithromycin for CAP. Blood cultures positive (3/4) for MSSA bacteremia.  Pharmacy has been consulted for cefazolin dosing.  Plan: Initiate cefazolin 2 gram Q12H based on renal function  Height: 5\' 5"  (165.1 cm) Weight: 59.9 kg (132 lb) IBW/kg (Calculated) : 57  Temp (24hrs), Avg:98 F (36.7 C), Min:97.6 F (36.4 C), Max:98.6 F (37 C)  Recent Labs  Lab 08/05/22 0818 08/06/22 0714  WBC 12.9* 11.7*  CREATININE 1.00 1.07*    Estimated Creatinine Clearance: 36.5 mL/min (A) (by C-G formula based on SCr of 1.07 mg/dL (H)).    Allergies  Allergen Reactions   Isoniazid Other (See Comments)    Blisters     Antimicrobials this admission: 3/27-3/28 ceftriaxone >>  3/27-3/28 metronidazole >>  3/28 cefazolin >>   Dose adjustments this admission:   Microbiology results: 3/27 BCx: 3/4: MSSA 3/27 Sputum: sent    Thank you for allowing pharmacy to be a part of this patient's care.  Dorothe Pea, PharmD, BCPS Clinical Pharmacist   08/06/2022 10:09 PM

## 2022-08-06 NOTE — TOC Initial Note (Signed)
Transition of Care [facility]) - Initial/Assessment Note    Patient Details  Name: UTE WILLHELM MRN: [id number] Date of Birth: 1941-04-20  Transition of Care [facility]) CM/SW Contact:    Candie Chroman, LCSW Phone Number: 08/06/2022, 3:49 PM  Clinical Narrative:   Readmission prevention screen complete. Patient on airborne isolation precautions so called her in the room, introduced role, and explained that discharge planning would be discussed. PCP is Dr. Ginette Pitman. Granddaughter drives her to appointments. Pharmacy is [facility] in [place]. No issues obtaining medications. Patient lives with her granddaughter. No home health prior to admission. She has a RW and shower chair at home. Patient is not on oxygen at home but currently on 3 L. Will follow for this potential need as well. No further concerns. CSW encouraged patient to contact CSW as needed. CSW will continue to follow patient for support and facilitate return home once stable. Granddaughter will transport her home at discharge.                 Barriers to Discharge: Continued Medical Work up   Patient Goals and CMS Choice            Expected Discharge Plan and Services     Post Acute Care Choice: NA Living arrangements for the past 2 months: Single Family Home                                      Prior Living Arrangements/Services Living arrangements for the past 2 months: Single Family Home Lives with:: Relatives Patient language and need for interpreter reviewed:: Yes Do you feel safe going back to the place where you live?: Yes      Need for Family Participation in Patient Care: Yes (Comment) Care giver support system in place?: Yes (comment) Current home services: DME Criminal Activity/Legal Involvement Pertinent to Current Situation/Hospitalization: No - Comment as needed  Activities of Daily Living Home Assistive Devices/Equipment: Walker (specify type), Raised toilet seat with rails, Eyeglasses ADL Screening  (condition at time of admission) Patient's cognitive ability adequate to safely complete daily activities?: Yes Is the patient deaf or have difficulty hearing?: Yes Does the patient have difficulty seeing, even when wearing glasses/contacts?: Yes Does the patient have difficulty concentrating, remembering, or making decisions?: No Patient able to express need for assistance with ADLs?: Yes Does the patient have difficulty dressing or bathing?: No Independently performs ADLs?: No Communication: Independent Dressing (OT): Needs assistance Is this a change from baseline?: Pre-admission baseline Grooming: Independent Feeding: Independent Bathing: Independent Toileting: Needs assistance Is this a change from baseline?: Change from baseline, expected to last <3 days In/Out Bed: Needs assistance Is this a change from baseline?: Change from baseline, expected to last <3 days Walks in Home: Needs assistance Is this a change from baseline?: Change from baseline, expected to last <3 days Does the patient have difficulty walking or climbing stairs?: Yes Weakness of Legs: Both Weakness of Arms/Hands: Both  Permission Sought/Granted                  Emotional Assessment Appearance:: Appears stated age Attitude/Demeanor/Rapport: Engaged, Gracious Affect (typically observed): Accepting, Appropriate, Calm, Pleasant Orientation: : Oriented to Self, Oriented to Place, Oriented to  Time, Oriented to Situation Alcohol / Substance Use: Not Applicable Psych Involvement: No (comment)  Admission diagnosis:  Pneumonia [J18.9] CAP (community acquired pneumonia) [J18.9] Patient Active Problem List   Diagnosis Date  Noted   CAP (community acquired pneumonia) 08/06/2022   Pneumonia 08/05/2022   Thoracic compression fracture, with delayed healing, subsequent encounter 07/29/2022   Acute deep vein thrombosis (DVT) of calf muscle vein of right lower extremity ([place]) 05/10/2022   Compression fracture of  body of thoracic vertebra ([place]) 05/07/2022   Pneumonia involving right lung 05/07/2022   Acute respiratory failure with hypoxia ([place]) 05/07/2022   Elevated troponin 05/07/2022   Anemia of chronic kidney failure 04/27/2022   Esophageal stenosis 04/26/2022   COVID-19 virus infection 04/25/2022   Multiple myeloma ([place]) 04/25/2022   Hypothyroidism 04/25/2022   Depression with anxiety 04/25/2022   Rheumatoid arthritis ([place]) 04/25/2022   Iron deficiency anemia 04/25/2022   CKD (chronic kidney disease), stage IV ([place]) 04/25/2022   Syncope 09/01/2018   Pneumonia due to COVID-19 virus 09/01/2018   Anemia due to stage 3 chronic kidney disease ([place]) 12/28/2016   Lumbar radiculopathy 06/18/2016   Fatigue 01/29/2016   Low serum vitamin D 01/29/2016   Multiple myeloma not having achieved remission ([place]) 12/18/2015   Spondylolisthesis of lumbar region 11/25/2015   Soft tissue lesion of shoulder region 09/11/2015   Acid reflux 09/11/2015   Lumbar and sacral osteoarthritis 07/22/2015   Degeneration of intervertebral disc of lumbar region 10/18/2013   Neuritis or radiculitis due to rupture of lumbar intervertebral disc 10/18/2013   Degenerative arthritis of lumbar spine 10/18/2013   Allergic rhinitis 09/27/2013   HLD (hyperlipidemia) 09/27/2013   Awareness of heartbeats 09/27/2013   Gout 09/21/2013   Abnormal result of Mantoux test 09/21/2013   Impaired renal function 09/21/2013   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   OP (osteoporosis) [id number]   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[place]) 09/23/2011   PCP:  [name], MD Pharmacy:   [facility] [place], [place] - [place] [address] AT [address] [place] [address] Phone: [phone] Fax: [place] [address] Phone: [phone] Fax: [phone]  [facility], [place] - [address] [place] [address] Phone: [phone] Fax: [phone]     Social Determinants of Health (SDOH) Social History: SDOH Screenings   Food Insecurity: No Food Insecurity (08/05/2022)  Housing: Low Risk  (08/05/2022)  Transportation Needs: No Transportation Needs (08/05/2022)  Utilities: Not At Risk (08/05/2022)  Tobacco Use: Low Risk  (08/05/2022)   SDOH Interventions:     Readmission Risk Interventions    08/06/2022    3:47 PM 05/10/2022    2:56 PM  Readmission Risk Prevention Plan  Transportation Screening Complete Complete  Medication Review ([place]) Complete Complete  PCP or Specialist appointment within 3-5 days of discharge Complete Complete  HRI or [place]  Complete  SW Recovery Care/Counseling Consult Complete Complete  Palliative Care Screening Not Applicable Not [place] Not Applicable Not Applicable

## 2022-08-06 NOTE — Progress Notes (Signed)
PHARMACY - PHYSICIAN COMMUNICATION CRITICAL VALUE ALERT - BLOOD CULTURE IDENTIFICATION (BCID)  Sydney Gillespie is an 82 y.o. female who presented to [facility] on 08/05/2022 with a chief complaint of PNA  Assessment:  Staph aureus in 1 of 4 bottles, anaerobic , no resistance detected  (include suspected source if known)  Name of physician (or Provider) Contacted: Sharion Settler, NP  Current antibiotics: Ceftriaxone, metronidazole, azithromycin   Changes to prescribed antibiotics recommended:  Recommendations accepted by provider  D/C current abx and start cefazolin 2 gm IV Q8H.   Results for orders placed or performed during the hospital encounter of 08/05/22  Blood Culture ID Panel (Reflexed) (Collected: 08/05/2022 11:00 AM)  Result Value Ref Range   Enterococcus faecalis NOT DETECTED NOT DETECTED   Enterococcus Faecium NOT DETECTED NOT DETECTED   Listeria monocytogenes NOT DETECTED NOT DETECTED   Staphylococcus species DETECTED (A) NOT DETECTED   Staphylococcus aureus (BCID) DETECTED (A) NOT DETECTED   Staphylococcus epidermidis NOT DETECTED NOT DETECTED   Staphylococcus lugdunensis NOT DETECTED NOT DETECTED   Streptococcus species NOT DETECTED NOT DETECTED   Streptococcus agalactiae NOT DETECTED NOT DETECTED   Streptococcus pneumoniae NOT DETECTED NOT DETECTED   Streptococcus pyogenes NOT DETECTED NOT DETECTED   A.calcoaceticus-baumannii NOT DETECTED NOT DETECTED   Bacteroides fragilis NOT DETECTED NOT DETECTED   Enterobacterales NOT DETECTED NOT DETECTED   Enterobacter cloacae complex NOT DETECTED NOT DETECTED   Escherichia coli NOT DETECTED NOT DETECTED   Klebsiella aerogenes NOT DETECTED NOT DETECTED   Klebsiella oxytoca NOT DETECTED NOT DETECTED   Klebsiella pneumoniae NOT DETECTED NOT DETECTED   Proteus species NOT DETECTED NOT DETECTED   Salmonella species NOT DETECTED NOT DETECTED   Serratia marcescens NOT DETECTED NOT DETECTED   Haemophilus influenzae NOT  DETECTED NOT DETECTED   Neisseria meningitidis NOT DETECTED NOT DETECTED   Pseudomonas aeruginosa NOT DETECTED NOT DETECTED   Stenotrophomonas maltophilia NOT DETECTED NOT DETECTED   Candida albicans NOT DETECTED NOT DETECTED   Candida auris NOT DETECTED NOT DETECTED   Candida glabrata NOT DETECTED NOT DETECTED   Candida krusei NOT DETECTED NOT DETECTED   Candida parapsilosis NOT DETECTED NOT DETECTED   Candida tropicalis NOT DETECTED NOT DETECTED   Cryptococcus neoformans/gattii NOT DETECTED NOT DETECTED   Meth resistant mecA/C and MREJ NOT DETECTED NOT DETECTED    Shankar Silber D 08/06/2022  3:18 AM

## 2022-08-06 NOTE — Consult Note (Signed)
[place] for Enoxparin Indication: DVT Treatment  Allergies  Allergen Reactions   Isoniazid Other (See Comments)    Blisters     Patient Measurements: Height: 5\' 5"  (165.1 cm) Weight: 59.9 kg (132 lb) IBW/kg (Calculated) : 57 kg  Vital Signs: Temp: 97.6 F (36.4 C) (03/28 0417) Temp Source: Oral (03/28 0417) BP: 142/68 (03/28 0417) Pulse Rate: 76 (03/28 0417)  Labs: Recent Labs    08/05/22 0818 08/05/22 1100 08/05/22 1339  HGB 9.1*  --   --   HCT 30.7*  --   --   PLT 452*  --   --   LABPROT  --   --  17.9*  INR  --   --  1.5*  CREATININE 1.00  --   --   TROPONINIHS 19* 17  --      Estimated Creatinine Clearance: 39 mL/min (by C-G formula based on SCr of 1 mg/dL).   Medical History: Past Medical History:  Diagnosis Date   Acute respiratory failure with hypoxia ([place]) 05/07/2022   Allergic rhinitis    Allergy    Anemia    Anemia    Barrett's esophagus    Blood dyscrasia    multiple myloma remission   Cancer (HCC)    Change in bowel habits    Compression fracture 2013   Degenerative disc disease, lumbar    Dysrhythmia    palpitations   Esophageal reflux    Family history of adverse reaction to anesthesia    nausea -mom   GERD (gastroesophageal reflux disease)    Gout    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[place])    Heart palpitations    Hyperlipidemia    Hypothyroidism    Inflammatory polyarthropathy (HCC)    Lumbago    Lumbar radiculitis    Lumbar stenosis with neurogenic claudication    Multiple myeloma (HCC)    Multiple myeloma (HCC)    Neuropathy    Bilateral Feet   Osteopenia    Osteoporosis    Other dysphagia    Palpitations    Pneumonia 04/2022   R/T COVID   Positive PPD    Renal insufficiency    Rheumatoid arthritis (HCC)    Rheumatoid arthritis (HCC)    Seasonal allergies    Tuberculosis 2011   Vertigo     Medications:  Scheduled:   ALPRAZolam  0.5 mg Oral Once   enoxaparin (LOVENOX)  injection  1.5 mg/kg Subcutaneous Q24H   ipratropium-albuterol  3 mL Nebulization Q6H   methylPREDNISolone (SOLU-MEDROL) injection  125 mg Intravenous Q24H   Infusions:   sodium chloride 75 mL/hr at 08/06/22 0604   PRN: ipratropium-albuterol, morphine injection, ondansetron **OR** ondansetron (ZOFRAN) IV  Assessment: Sydney Gillespie is a 82 y.o. female presenting with hypoxia and aspiration pneumonia. PMH significant for Barrett's esophagus, compression fracture s/p kyphoplasty (07/29/22), HLD, hypothyroidism, gout, CKD4, DVT (05/10/22) on Eliquis. Patient was on [facility] PTA per chart review. Last dose of Eliqus is not yet recorded. Patient will start enoxaparin for continuation of DVT treatment while inpatient pending speech evaluation in the setting of aspiration pneumonia. Pharmacy has been consulted to manage Lovenox for DVT.   Baseline Labs: SCr 1.00 Hgb 9.1, Hct 30.7, Plt 452  Baseline PT/INR ordered  Goal of Therapy:  Monitor platelets by anticoagulation protocol: Yes   Plan:  Adjust Lovenox 1mg /kg Q12Hr Check CBC at least every 3 days Continue to monitor renal function while on enoxaparin   Glean Salvo, PharmD,  BCPS Clinical Pharmacist  08/06/2022 7:37 AM

## 2022-08-07 ENCOUNTER — Inpatient Hospital Stay: Payer: Medicare Other

## 2022-08-07 DIAGNOSIS — J9601 Acute respiratory failure with hypoxia: Secondary | ICD-10-CM | POA: Diagnosis not present

## 2022-08-07 DIAGNOSIS — B9561 Methicillin susceptible Staphylococcus aureus infection as the cause of diseases classified elsewhere: Secondary | ICD-10-CM

## 2022-08-07 DIAGNOSIS — R7881 Bacteremia: Secondary | ICD-10-CM

## 2022-08-07 DIAGNOSIS — N184 Chronic kidney disease, stage 4 (severe): Secondary | ICD-10-CM | POA: Diagnosis not present

## 2022-08-07 DIAGNOSIS — S22000G Wedge compression fracture of unspecified thoracic vertebra, subsequent encounter for fracture with delayed healing: Secondary | ICD-10-CM

## 2022-08-07 LAB — BASIC METABOLIC PANEL
Anion gap: 9 (ref 5–15)
BUN: 25 mg/dL — ABNORMAL HIGH (ref 8–23)
CO2: 22 mmol/L (ref 22–32)
Calcium: 7.5 mg/dL — ABNORMAL LOW (ref 8.9–10.3)
Chloride: 101 mmol/L (ref 98–111)
Creatinine, Ser: 1.3 mg/dL — ABNORMAL HIGH (ref 0.44–1.00)
GFR, Estimated: 41 mL/min — ABNORMAL LOW (ref >60)
Glucose, Bld: 136 mg/dL — ABNORMAL HIGH (ref 70–99)
Potassium: 4.6 mmol/L (ref 3.5–5.1)
Sodium: 132 mmol/L — ABNORMAL LOW (ref 135–145)

## 2022-08-07 LAB — PROTEIN, PLEURAL OR PERITONEAL FLUID: Total protein, fluid: <3 g/dL

## 2022-08-07 LAB — LACTATE DEHYDROGENASE, PLEURAL OR PERITONEAL FLUID: LD, Fluid: 88 U/L — ABNORMAL HIGH (ref 3–23)

## 2022-08-07 LAB — CBC
HCT: 24.1 % — ABNORMAL LOW (ref 36.0–46.0)
Hemoglobin: 7.2 g/dL — ABNORMAL LOW (ref 12.0–15.0)
MCH: 29.6 pg (ref 26.0–34.0)
MCHC: 29.9 g/dL — ABNORMAL LOW (ref 30.0–36.0)
MCV: 99.2 fL (ref 80.0–100.0)
Platelets: 325 10*3/uL (ref 150–400)
RBC: 2.43 MIL/uL — ABNORMAL LOW (ref 3.87–5.11)
RDW: 16.7 % — ABNORMAL HIGH (ref 11.5–15.5)
WBC: 12.8 10*3/uL — ABNORMAL HIGH (ref 4.0–10.5)
nRBC: 0 % (ref 0.0–0.2)

## 2022-08-07 LAB — BODY FLUID CELL COUNT WITH DIFFERENTIAL
Lymphs, Fluid: 61 %
Monocyte-Macrophage-Serous Fluid: 3 %
Neutrophil Count, Fluid: 36 %
Total Nucleated Cell Count, Fluid: 638 cu mm

## 2022-08-07 LAB — MAGNESIUM: Magnesium: 1.8 mg/dL (ref 1.7–2.4)

## 2022-08-07 LAB — LEGIONELLA PNEUMOPHILA SEROGP 1 UR AG: L. pneumophila Serogp 1 Ur Ag: NEGATIVE

## 2022-08-07 LAB — GLUCOSE, PLEURAL OR PERITONEAL FLUID: Glucose, Fluid: 121 mg/dL

## 2022-08-07 MED ORDER — ALLOPURINOL 100 MG PO TABS
300.0000 mg | ORAL_TABLET | Freq: Every day | ORAL | Status: DC
  Administered 2022-08-07 – 2022-08-12 (×5): 300 mg via ORAL
  Filled 2022-08-07 (×6): qty 3

## 2022-08-07 MED ORDER — OXYBUTYNIN CHLORIDE ER 10 MG PO TB24
10.0000 mg | ORAL_TABLET | Freq: Every day | ORAL | Status: DC
  Administered 2022-08-07 – 2022-08-12 (×5): 10 mg via ORAL
  Filled 2022-08-07 (×6): qty 1

## 2022-08-07 MED ORDER — PANTOPRAZOLE SODIUM 40 MG PO TBEC
40.0000 mg | DELAYED_RELEASE_TABLET | Freq: Two times a day (BID) | ORAL | Status: DC
  Administered 2022-08-07 – 2022-08-12 (×9): 40 mg via ORAL
  Filled 2022-08-07 (×10): qty 1

## 2022-08-07 MED ORDER — GADOBUTROL 1 MMOL/ML IV SOLN
6.0000 mL | Freq: Once | INTRAVENOUS | Status: AC | PRN
  Administered 2022-08-07: 6 mL via INTRAVENOUS

## 2022-08-07 MED ORDER — DOCUSATE SODIUM 100 MG PO CAPS
100.0000 mg | ORAL_CAPSULE | Freq: Two times a day (BID) | ORAL | Status: DC
  Administered 2022-08-07 – 2022-08-12 (×9): 100 mg via ORAL
  Filled 2022-08-07 (×10): qty 1

## 2022-08-07 MED ORDER — ATENOLOL 50 MG PO TABS
50.0000 mg | ORAL_TABLET | Freq: Every day | ORAL | Status: DC
  Administered 2022-08-07 – 2022-08-12 (×6): 50 mg via ORAL
  Filled 2022-08-07 (×6): qty 1

## 2022-08-07 MED ORDER — MIRABEGRON ER 50 MG PO TB24
50.0000 mg | ORAL_TABLET | Freq: Every day | ORAL | Status: DC
  Administered 2022-08-07 – 2022-08-12 (×5): 50 mg via ORAL
  Filled 2022-08-07 (×6): qty 1

## 2022-08-07 MED ORDER — OLANZAPINE 10 MG PO TABS
10.0000 mg | ORAL_TABLET | Freq: Every day | ORAL | Status: DC
  Administered 2022-08-07 – 2022-08-11 (×5): 10 mg via ORAL
  Filled 2022-08-07 (×5): qty 1

## 2022-08-07 MED ORDER — LIDOCAINE HCL (PF) 1 % IJ SOLN
8.0000 mL | Freq: Once | INTRAMUSCULAR | Status: AC
  Administered 2022-08-07: 8 mL via INTRADERMAL

## 2022-08-07 MED ORDER — ROPINIROLE HCL 1 MG PO TABS
0.5000 mg | ORAL_TABLET | Freq: Every day | ORAL | Status: DC
  Administered 2022-08-07 – 2022-08-11 (×5): 0.5 mg via ORAL
  Filled 2022-08-07 (×5): qty 1

## 2022-08-07 MED ORDER — LEVOTHYROXINE SODIUM 112 MCG PO TABS
112.0000 ug | ORAL_TABLET | Freq: Every day | ORAL | Status: DC
  Administered 2022-08-08 – 2022-08-12 (×5): 112 ug via ORAL
  Filled 2022-08-07 (×5): qty 1

## 2022-08-07 MED ORDER — APIXABAN 5 MG PO TABS
5.0000 mg | ORAL_TABLET | Freq: Two times a day (BID) | ORAL | Status: DC
  Administered 2022-08-07 – 2022-08-12 (×10): 5 mg via ORAL
  Filled 2022-08-07 (×11): qty 1

## 2022-08-07 MED ORDER — DULOXETINE HCL 20 MG PO CPEP
20.0000 mg | ORAL_CAPSULE | Freq: Two times a day (BID) | ORAL | Status: DC
  Administered 2022-08-07 – 2022-08-12 (×10): 20 mg via ORAL
  Filled 2022-08-07 (×11): qty 1

## 2022-08-07 MED ORDER — TIZANIDINE HCL 2 MG PO TABS
2.0000 mg | ORAL_TABLET | Freq: Two times a day (BID) | ORAL | Status: DC
  Administered 2022-08-07 – 2022-08-10 (×6): 2 mg via ORAL
  Filled 2022-08-07 (×8): qty 1

## 2022-08-07 NOTE — Progress Notes (Signed)
Speech Language Pathology Treatment: Dysphagia  Patient Details Name: Sydney Gillespie MRN: [id number] DOB: 11-Apr-1941 Today's Date: 08/07/2022 Time: [id number] SLP Time Calculation (min) (ACUTE ONLY): 40 min  Assessment / Plan / Recommendation Clinical Impression  Pt seen for ongoing assessment of swallowing and toleration of diet at this Lunch meal today. No family present. Pt A/O; engaged easily and was able to describe her Chronic Esophageal phase Dysmotility.   Afebrile, WBC slightly elevated.    OF NOTE: Pt strongly endorses s/s of Esophageal phase Dysmotility and episodes of Regurgitation/REFLUX at home. She stated she has to cough the food "back up when it gets stuck here" -- pointing to her mid-sternum area. Noted recent EGD: pt has KNOWN Significant Esophageal phase Dysmotility w/ EGD(07/17/22): "Abnormal motility was noted in the esophagus. The cricopharyngeus was abnormal. There is a decrease in motility of the esophageal body. The distal esophagus/ lower esophageal sphincter is spastic".  Strongly recommend f/u w/ GI post D/C for Ed w/ Family and pt on the chronic nature of Barrett's Esophagus(dx'd per chart) and the presentation of the EGD; and best foods moving forward for her(NOT meats and breads!!). Potential Dietician f/u also.  Pt appears at reduced risk for aspiration from an oropharyngeal phase standpoint following general aspiration precautions. HOWEVER, pt has a baseline presentation of Esophageal phase Dysmotility and REFLUX w/ episodes of Regurgitation.  ANY Dysmotility or Regurgitation of Reflux material can increase risk for aspiration of the Reflux material during Retrograde flow thus impact Pulmonary status. Pt described issues of globus, dry throat clearing/cough, and regurgitation of "stuck foods".     Pt appears to present w/ functional oropharyngeal phase swallowing w/ po trials at lunch meal/dessert -- pt consumed trials of thin liquids via CUP(no straw d/t air  swallowed), puree and moistened cookie followed by sip of thin liquid(coffee).  No overt oropharyngeal phase dysphagia appreciated during oral intake of trials; no neuromuscular deficits appreciated.  No immediate, overt clinical s/s of aspiration noted; clear vocal quality b/t trials, no decline in pulmonary status, no coughing. Oral phase appeared [facility] for bolus management and timely A-P transfer/clearing of material. Mastication appropriate/timely.    Recommend a fairly Regular diet for choices BUT chopped/cut foods, moistened well; thin liquids via Cup. General aspiration precautions. STRICT REFLUX PRECAUTIONS at meals. Rest Breaks during meals/oral intake to allow for Esophageal clearing. Strongly recommended to lessen eating of problematic foods to lessent chance for Regurgitation. HOB elevated at night when sleeping.   Recommend pt f/u w/ GI for management/education of her Esophageal phase Dysmotility including Barrett's Esophagus. Discussion and handouts given on REFLUX precautions provided by ST services.  No further ST services indicated in setting of functional oropharyngeal swallowing; education completed. MD to reconsult ST services if any new needs while admitted. NSG updated. Pt appreciative of Education information.      HPI HPI: Pt is a 82yo female admitted 08/05/22 with SOB, acute respiratory failure with hypoxia and aspiration PNA. Pt reported episodes of regurgitation "if the food gets stuck" -- pointing to her mid-sternum area. See recent EGD 07/2022 indicating Esophageal phase Dysmotility and spastic distal esophagus/ lower esophageal sphincter.  CXR - right-greater-than-left basilar opacities, increased when compared  with the prior, concerning for infection or aspiration. PMH: Barrett's Esophagus, GERD, esophageal phase dysphagia, RA, PNA/COVID, compression fx s/p kyphoplasty, HLD, hypothyroidism, gout, CKD4, DVT, cancer (multiple myeloma), DDD.  OF NOTE: pt had a recent EGD 07/2022  stating it was "stretched" then but that she "shouldn't have any more endoscopies"  but instead a "motility test".  Recommend f/u w/ GI for further education and management of Esophageal phase Dysmotility.      SLP Plan  All goals met;Consult other service (comment) (GI f/u for management; Dietician f/u for support)      Recommendations for follow up therapy are one component of a multi-disciplinary discharge planning process, led by the attending physician.  Recommendations may be updated based on patient status, additional functional criteria and insurance authorization.    Recommendations  Diet recommendations: Regular;Dysphagia 3 (mechanical soft);Thin liquid (chopped foods moistened well) Liquids provided via: Cup Medication Administration: Whole meds with puree Supervision: Patient able to self feed (setup) Compensations: Minimize environmental distractions;Slow rate;Small sips/bites;Follow solids with liquid Postural Changes and/or Swallow Maneuvers: Out of bed for meals;Seated upright 90 degrees;Upright 30-60 min after meal (REFLUX precautions)                 (GI f/u for management; Dietician f/u for support) Oral care BID;Oral care before and after PO;Patient independent with oral care (setup)   Set up Supervision/Assistance Dysphagia, unspecified (R13.10) (Esophageal phase Dysmotility - known)     All goals met;Consult other service (comment) (GI f/u for management; Dietician f/u for support)       Orinda Kenner, [place], [place]; [place] [phone] (ascom) Sharvi Mooneyhan  08/07/2022, 5:23 PM

## 2022-08-07 NOTE — Progress Notes (Signed)
Date of Admission:  08/05/2022      ID: Sydney Gillespie is a 82 y.o. female  Principal Problem:   Pneumonia Active Problems:   Gout   Hypothyroidism   Iron deficiency anemia   CKD (chronic kidney disease), stage IV (HCC)   Acute respiratory failure with hypoxia (HCC)   Acute deep vein thrombosis (DVT) of calf muscle vein of right lower extremity (HCC)   Thoracic compression fracture, with delayed healing, subsequent encounter   CAP (community acquired pneumonia)    Subjective: Underwent thoracentesis today and 700cc removed from rt side Feels much better No tightness in chest Able to breath better  Medications:   allopurinol  300 mg Oral Daily   apixaban  5 mg Oral BID   atenolol  50 mg Oral Daily   DULoxetine  20 mg Oral BID   [START ON 08/08/2022] levothyroxine  112 mcg Oral Q0600   mirabegron ER  50 mg Oral Daily   OLANZapine  10 mg Oral QHS   oxybutynin  10 mg Oral Daily   pantoprazole  40 mg Oral BID AC   rOPINIRole  0.5 mg Oral QHS   tiZANidine  2 mg Oral BID    Objective: Vital signs in last 24 hours: Patient Vitals for the past 24 hrs:  BP Temp Temp src Pulse Resp SpO2  08/07/22 0737 -- -- -- -- -- 98 %  08/07/22 0732 (!) 143/83 (!) 97.5 F (36.4 C) -- 79 18 100 %  08/07/22 0426 130/80 98.7 F (37.1 C) Oral 76 18 100 %  08/06/22 1945 127/65 98.6 F (37 C) -- 90 20 100 %  08/06/22 1554 132/66 98.1 F (36.7 C) -- 96 18 100 %       PHYSICAL EXAM:  General: Alert, cooperative, no distress, appears stated age.  Head: Normocephalic, without obvious abnormality, atraumatic. Eyes: Conjunctivae clear, anicteric sclerae. Pupils are equal ENT Nares normal. No drainage or sinus tenderness. Lips, mucosa, and tongue normal. No Thrush Neck: Supple, symmetrical, no adenopathy, thyroid: non tender no carotid bruit and no JVD. Back: thoracic gibbus Lungs: b/la ir entry Decreased rt base Heart: s1s2 Abdomen: Soft, non-tender,not distended. Bowel sounds  normal. No masses Extremities: atraumatic, no cyanosis. No edema. No clubbing Skin: No rashes or lesions. Or bruising Lymph: Cervical, supraclavicular normal. Neurologic: Grossly non-focal  Lab Results    Latest Ref Rng & Units 08/07/2022    4:22 AM 08/06/2022    7:14 AM 08/05/2022    8:18 AM  CBC  WBC 4.0 - 10.5 K/uL 12.8  11.7  12.9   Hemoglobin 12.0 - 15.0 g/dL 7.2  8.0  9.1   Hematocrit 36.0 - 46.0 % 24.1  26.9  30.7   Platelets 150 - 400 K/uL 325  346  452        Latest Ref Rng & Units 08/07/2022    4:22 AM 08/06/2022    7:14 AM 08/05/2022    8:18 AM  CMP  Glucose 70 - 99 mg/dL 136  122  109   BUN 8 - 23 mg/dL 25  17  13    Creatinine 0.44 - 1.00 mg/dL 1.30  1.07  1.00   Sodium 135 - 145 mmol/L 132  135  137   Potassium 3.5 - 5.1 mmol/L 4.6  4.5  3.6   Chloride 98 - 111 mmol/L 101  102  103   CO2 22 - 32 mmol/L 22  22  24    Calcium 8.9 - 10.3  mg/dL 7.5  7.8  9.0   Total Protein 6.5 - 8.1 g/dL  5.7    Total Bilirubin 0.3 - 1.2 mg/dL  0.5    Alkaline Phos 38 - 126 U/L  51    AST 15 - 41 U/L  25    ALT 0 - 44 U/L  5        Microbiology: 08/05/22 2/2 sets MSSA 3/29 BC sent Studies/Results: US Venous Img Lower Bilateral (DVT)  Result Date: 08/05/2022 CLINICAL DATA:  Lower extremity weakness EXAM: BILATERAL LOWER EXTREMITY VENOUS DOPPLER ULTRASOUND TECHNIQUE: Gray-scale sonography with compression, as well as color and duplex ultrasound, were performed to evaluate the deep venous system(s) from the level of the common femoral vein through the popliteal and proximal calf veins. COMPARISON:  None Available. FINDINGS: VENOUS Nonocclusive thrombus in the right posterior tibial vein. Remaining venous structures of both lower extremities demonstrate no evidence of DVT. OTHER None. Limitations: none IMPRESSION: 1. Nonocclusive calf vein DVT involving the right posterior tibial vein. No other deep vein thrombosis is demonstrated in either lower extremity. These results will be called to  the ordering clinician or representative by the Radiologist Assistant, and communication documented in the PACS or [employer]. Electronically Signed   By: Van Clines M.D.   On: 08/05/2022 15:28     Assessment/Plan: ?pt presenting with acute SOB   MSSA bacteremia Need to look for source Will repeat blood culture today Need 2 d echo Will need TEE to look for vegetation if 2 d echo unrevealing Will  need MRI Thoracic spine  rt thoracentesis done and sent for tests On cefazolin will need for likely 6 weeks   Esophageal dysmotility with risk for aspiration'   GGO and tree in bud left lower lung concerning for aspiration pneumoniits Procal < 0.10 so doubt it is pneumonia   Rheumatoid arthritis on plaquenil   Multiple myeloma s/p SCT Chemo on hold since she had covid Dec 2023     Recent kyphoplasty for Thoracic spine compression fractures   Anemia   CKD- need to check with Dr.Singh/ Dr.Kolluru whether PICC can be placed on discharge  Discussed with patient and hospitalist Im anot on call this weekend- [facility] covering by phone for urgent issues

## 2022-08-07 NOTE — Consult Note (Signed)
Pharmacy Antibiotic Note  Sydney Gillespie is a 82 y.o. female admitted on 08/05/2022 with SOB. 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 Originally started on ceftriaxone +azithromycin for CAP. Blood cultures positive (3/4) for MSSA bacteremia.  Pharmacy has been consulted for cefazolin dosing.  CrCl borderline and Scr trending up.  Plan: Continue cefazolin IV 2 grams Q12Hr   Height: 5\' 5"  (165.1 cm) Weight: 59.9 kg (132 lb) IBW/kg (Calculated) : 57  Temp (24hrs), Avg:98.3 F (36.8 C), Min:97.8 F (36.6 C), Max:98.7 F (37.1 C)  Recent Labs  Lab 08/05/22 0818 08/06/22 0714 08/07/22 0422  WBC 12.9* 11.7* 12.8*  CREATININE 1.00 1.07* 1.30*     Estimated Creatinine Clearance: 30 mL/min (A) (by C-G formula based on SCr of 1.3 mg/dL (H)).    Allergies  Allergen Reactions   Isoniazid Other (See Comments)    Blisters     Antimicrobials this admission: 3/27-3/28 ceftriaxone >>  3/27-3/28 metronidazole >>  3/28 cefazolin >>   Dose adjustments this admission:   Microbiology results: 3/27 BCx: 3/4 MSSA  Thank you for allowing pharmacy to be a part of this patient's care.  Wynelle Cleveland, PharmD, BCPS Clinical Pharmacist   08/07/2022 7:30 AM

## 2022-08-07 NOTE — Procedures (Signed)
PROCEDURE SUMMARY:  Successful US guided diagnostic and therapeutic right thoracentesis. Yielded 700 cc of hazy, amber fluid. Pt tolerated procedure well. No immediate complications.  Specimen was sent for labs. CXR ordered.  EBL < 1 mL  Tyson Alias, AGNP 08/07/2022 4:17 PM

## 2022-08-07 NOTE — Progress Notes (Signed)
PROGRESS NOTE    Sydney Gillespie  [id number] DOB: [birth year] DOA: 08/05/2022 PCP: [name], MD  [id number]  LOS: 1 day   Brief hospital course:   Assessment & Plan: Sydney Gillespie is a 82 y.o. female with medical history significant of multiple medical issues including Barrett's esophagus, compression fracture status post kyphoplasty March 20, hyperlipidemia, hypothyroidism, gout, stage IV CKD, DVT on Eliquis presenting with acute respiratory failure with hypoxia and aspiration pneumonia.  Patient noted to have been admitted recently for thoracic compression fractures with noted T8, T9 and T10 kyphoplasty and vertebral body biopsy by Dr. Arnoldo Morale on March 20.  Patient says overall stable pain since hospitalization.  Patient with significant increase shortness of breath over the past 12 to 24 hours.    Some remote history of dysphagia in the past per the grand daughter. O2 sats were dropping in to the upper 80s to low 90s at home.    MSSA bacteremia  --ID consulted --Echo, repeat blood cx, MRI thoracic spine, right thoracentesis with fluid studies --switch abx to cefazolin, per ID  * Pneumonia New onset resp failure w/ hypoxia w/ noted LLL PNA concerning for possible aspiration event  Started on rocephin, azithromycin, flagyl in ER  --covid and RVP neg --SLP eval cleared for diet --switch abx to cefazolin, per ID  Acute respiratory failure with hypoxia (HCC) New O2 requirement in setting of LLL PNA on CT concerning for aspiration PNA.  No evidence of PE.  Respiratory distress may also be exacerbated by anxiety. --weaned down to RA today --Xanax PRN while inpatient  CKD 3b Hx of CKD 4, now improved to 3b  Gout --cont allopurinol  Hypothyroidism --cont Synthroid  Iron deficiency anemia --anemia workup showed iron def  Thoracic compression fracture, with delayed healing, subsequent encounter S/p T8, T9 and T10 kyphoplasty and vertebral body biopsy  07/29/2022 by Dr. Arnoldo Morale w/ NSG  Appears stable  --MRI thoracic spine  Deep vein thrombosis (DVT)  --[place] found "Nonocclusive calf vein DVT involving the right posterior tibial vein", which is similar to finding back on 04/25/2022 --cont home Eliquis   DVT prophylaxis: Lovenox SQ Code Status: Full code  Family Communication: granddaughter updated on the phone today Level of care: Med-Surg Dispo:   The patient is from: home Anticipated d/c is to: home Anticipated d/c date is: >3 days   Subjective and Interval History:  Pt was weaned down to room air this morning.  Breathing improved after pt released some gas.     Objective: Vitals:   08/07/22 0732 08/07/22 0737 08/07/22 1549 08/07/22 1605  BP: (!) 143/83  (!) 150/78   Pulse: 79  94 92  Resp: 18  18 18   Temp: (!) 97.5 F (36.4 C)     TempSrc:      SpO2: 100% 98% 96% 99%  Weight:      Height:        Intake/Output Summary (Last 24 hours) at 08/07/2022 1937 Last data filed at 08/07/2022 1927 Gross per 24 hour  Intake 580 ml  Output --  Net 580 ml   Filed Weights   08/05/22 0805  Weight: 59.9 kg    Examination:   Constitutional: NAD, AAOx3 HEENT: conjunctivae and lids normal, EOMI CV: No cyanosis.   RESP: normal respiratory effort, on RA Neuro: II - XII grossly intact.   Psych: Normal mood and affect.  Appropriate judgement and reason   Data Reviewed: I have personally reviewed labs and imaging studies  Time  spent: 50 minutes  Enzo Bi, MD [facility] If 7PM-7AM, please contact night-coverage 08/07/2022, 7:37 PM

## 2022-08-08 DIAGNOSIS — B9561 Methicillin susceptible Staphylococcus aureus infection as the cause of diseases classified elsewhere: Secondary | ICD-10-CM | POA: Diagnosis not present

## 2022-08-08 DIAGNOSIS — R7881 Bacteremia: Secondary | ICD-10-CM | POA: Diagnosis not present

## 2022-08-08 LAB — CBC
HCT: 22.8 % — ABNORMAL LOW (ref 36.0–46.0)
Hemoglobin: 6.9 g/dL — ABNORMAL LOW (ref 12.0–15.0)
MCH: 29.9 pg (ref 26.0–34.0)
MCHC: 30.3 g/dL (ref 30.0–36.0)
MCV: 98.7 fL (ref 80.0–100.0)
Platelets: 292 10*3/uL (ref 150–400)
RBC: 2.31 MIL/uL — ABNORMAL LOW (ref 3.87–5.11)
RDW: 16.9 % — ABNORMAL HIGH (ref 11.5–15.5)
WBC: 10.8 10*3/uL — ABNORMAL HIGH (ref 4.0–10.5)
nRBC: 0 % (ref 0.0–0.2)

## 2022-08-08 LAB — BASIC METABOLIC PANEL
Anion gap: 12 (ref 5–15)
BUN: 30 mg/dL — ABNORMAL HIGH (ref 8–23)
CO2: 22 mmol/L (ref 22–32)
Calcium: 8 mg/dL — ABNORMAL LOW (ref 8.9–10.3)
Chloride: 101 mmol/L (ref 98–111)
Creatinine, Ser: 1.14 mg/dL — ABNORMAL HIGH (ref 0.44–1.00)
GFR, Estimated: 48 mL/min — ABNORMAL LOW (ref >60)
Glucose, Bld: 140 mg/dL — ABNORMAL HIGH (ref 70–99)
Potassium: 4.6 mmol/L (ref 3.5–5.1)
Sodium: 135 mmol/L (ref 135–145)

## 2022-08-08 LAB — PREPARE RBC (CROSSMATCH)

## 2022-08-08 LAB — MAGNESIUM: Magnesium: 1.9 mg/dL (ref 1.7–2.4)

## 2022-08-08 MED ORDER — CEFAZOLIN SODIUM-DEXTROSE 2-4 GM/100ML-% IV SOLN
2.0000 g | Freq: Three times a day (TID) | INTRAVENOUS | Status: DC
  Administered 2022-08-08 – 2022-08-12 (×12): 2 g via INTRAVENOUS
  Filled 2022-08-08 (×13): qty 100

## 2022-08-08 MED ORDER — SODIUM CHLORIDE 0.9% IV SOLUTION: Freq: Once | INTRAVENOUS | Status: AC

## 2022-08-08 NOTE — Consult Note (Signed)
Pharmacy Antibiotic Note  Sydney Gillespie is a 82 y.o. female admitted on 08/05/2022 with SOB. 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 Originally started on ceftriaxone +azithromycin for CAP. Blood cultures positive (3/4) for MSSA bacteremia.  Pharmacy has been consulted for cefazolin dosing.  Plan: adjust cefazolin to 2 grams IV every 8 hours   Height: 5\' 5"  (165.1 cm) Weight: 59.9 kg (132 lb) IBW/kg (Calculated) : 57  Temp (24hrs), Avg:98.1 F (36.7 C), Min:97.8 F (36.6 C), Max:98.5 F (36.9 C)  Recent Labs  Lab 08/05/22 0818 08/06/22 0714 08/07/22 0422 08/08/22 0251  WBC 12.9* 11.7* 12.8* 10.8*  CREATININE 1.00 1.07* 1.30* 1.14*     Estimated Creatinine Clearance: 34.2 mL/min (A) (by C-G formula based on SCr of 1.14 mg/dL (H)).    Allergies  Allergen Reactions   Isoniazid Other (See Comments)    Blisters     Antimicrobials this admission: 3/27 ceftriaxone >> 03/28 3/27 metronidazole >> 03/28 3/28 cefazolin >>   Microbiology results: 3/27 BCx: 3/4 MSSA  Thank you for allowing pharmacy to be a part of this patient's care.  Dallie Piles, PharmD, BCPS Clinical Pharmacist   08/08/2022 8:21 AM

## 2022-08-08 NOTE — Progress Notes (Signed)
PROGRESS NOTE    Sydney Gillespie  [id number] DOB: January 01, 1941 DOA: 08/05/2022 PCP: [name], MD  [id number]  LOS: 2 days   Brief hospital course:   Assessment & Plan: Sydney Gillespie is a 82 y.o. female with medical history significant of multiple medical issues including Barrett's esophagus, compression fracture status post kyphoplasty March 20, hyperlipidemia, hypothyroidism, gout, stage IV CKD, DVT on Eliquis presenting with acute respiratory failure with hypoxia and aspiration pneumonia.  Patient noted to have been admitted recently for thoracic compression fractures with noted T8, T9 and T10 kyphoplasty and vertebral body biopsy by Dr. Arnoldo Morale on March 20.  Patient says overall stable pain since hospitalization.  Patient with significant increase shortness of breath over the past 12 to 24 hours.    Some remote history of dysphagia in the past per the grand daughter. O2 sats were dropping in to the upper 80s to low 90s at home.    MSSA bacteremia  --ID consulted --Echo, repeat blood cx, MRI thoracic spine, right thoracentesis with fluid studies --cont cefazolin  * Pneumonia New onset resp failure w/ hypoxia w/ noted LLL PNA concerning for possible aspiration event  Started on rocephin, azithromycin, flagyl in ER then switched to cefazolin, per ID --covid and RVP neg --SLP eval cleared for diet  Acute respiratory failure with hypoxia (HCC) New O2 requirement in setting of LLL PNA on CT concerning for aspiration PNA.  No evidence of PE.  Respiratory distress may also be exacerbated by anxiety. --weaned down to RA  --Xanax PRN while inpatient  CKD 3b Hx of CKD 4, now improved to 3b  Gout --cont allopurinol  Hypothyroidism --cont Synthroid  Iron deficiency anemia --anemia workup showed iron def --transfused 1u pRBC (irradiated and leukocyte reduced) today for Hgb 6.9  Thoracic compression fracture, with delayed healing, subsequent encounter S/p T8, T9  and T10 kyphoplasty and vertebral body biopsy 07/29/2022 by Dr. 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  Small amount of ventral epidural enhancing material"  Deep vein thrombosis (DVT)  --[place] found "Nonocclusive calf vein DVT involving the right posterior tibial vein", which is similar to finding back on 04/25/2022 --cont home Eliquis   DVT prophylaxis: Lovenox SQ Code Status: Full code  Family Communication:  Level of care: Med-Surg Dispo:   The patient is from: home Anticipated d/c is to: home Anticipated d/c date is: >3 days   Subjective and Interval History:  Pt reported feeling more short of breath due to eating too much and expanding her stomach.    Hgb down to 6.9 this morning, transfused 1u pRBC (irradiated and leukocyte reduced).     Objective: Vitals:   08/08/22 0803 08/08/22 1338 08/08/22 1618 08/08/22 1646  BP: (!) 154/79  (!) 150/79 (!) 154/78  Pulse: 72  82 84  Resp: 18  18 18   Temp: 97.8 F (36.6 C)  98.2 F (36.8 C) 98.1 F (36.7 C)  TempSrc: Oral  Oral   SpO2: 100% 98% 97% 99%  Weight:      Height:        Intake/Output Summary (Last 24 hours) at 08/08/2022 1934 Last data filed at 08/08/2022 1920 Gross per 24 hour  Intake 823.83 ml  Output --  Net 823.83 ml   Filed Weights   08/05/22 0805  Weight: 59.9 kg    Examination:   Constitutional: NAD, AAOx3 HEENT: conjunctivae and lids normal, EOMI CV: No cyanosis.   RESP: normal  respiratory effort, on 2L Neuro: II - XII grossly intact.   Psych: Normal mood and affect.  Appropriate judgement and reason   Data Reviewed: I have personally reviewed labs and imaging studies  Time spent: 50 minutes  Enzo Bi, MD [facility] If 7PM-7AM, please contact night-coverage 08/08/2022, 7:34 PM

## 2022-08-09 ENCOUNTER — Inpatient Hospital Stay (HOSPITAL_COMMUNITY)
Admit: 2022-08-09 | Discharge: 2022-08-09 | Disposition: A | Discharge status: Home / Self Care | Payer: Medicare Other | Attending: Hospitalist | Admitting: Hospitalist

## 2022-08-09 DIAGNOSIS — R7881 Bacteremia: Secondary | ICD-10-CM

## 2022-08-09 DIAGNOSIS — J189 Pneumonia, unspecified organism: Secondary | ICD-10-CM | POA: Diagnosis not present

## 2022-08-09 LAB — CULTURE, BLOOD (ROUTINE X 2): Special Requests: ADEQUATE

## 2022-08-09 LAB — ECHOCARDIOGRAM COMPLETE
AR max vel: 1.38 cm2
AV Area VTI: 1.35 cm2
AV Area mean vel: 1.34 cm2
AV Mean grad: 6.3 mmHg
AV Peak grad: 12.1 mmHg
Ao pk vel: 1.74 m/s
Area-P 1/2: 7.59 cm2
Calc EF: 40.6 %
Height: 65 in
MV M vel: 6.26 m/s
MV Peak grad: 156.8 mmHg
MV VTI: 1.37 cm2
P 1/2 time: 297 msec
S' Lateral: 3 cm
Single Plane A2C EF: 48.1 %
Single Plane A4C EF: 40.7 %
Weight: 2112 oz

## 2022-08-09 LAB — CBC
HCT: 28.4 % — ABNORMAL LOW (ref 36.0–46.0)
Hemoglobin: 8.9 g/dL — ABNORMAL LOW (ref 12.0–15.0)
MCH: 30.4 pg (ref 26.0–34.0)
MCHC: 31.3 g/dL (ref 30.0–36.0)
MCV: 96.9 fL (ref 80.0–100.0)
Platelets: 294 10*3/uL (ref 150–400)
RBC: 2.93 MIL/uL — ABNORMAL LOW (ref 3.87–5.11)
RDW: 17.2 % — ABNORMAL HIGH (ref 11.5–15.5)
WBC: 12.1 10*3/uL — ABNORMAL HIGH (ref 4.0–10.5)
nRBC: 0.2 % (ref 0.0–0.2)

## 2022-08-09 LAB — BASIC METABOLIC PANEL
Anion gap: 8 (ref 5–15)
BUN: 27 mg/dL — ABNORMAL HIGH (ref 8–23)
CO2: 23 mmol/L (ref 22–32)
Calcium: 8.4 mg/dL — ABNORMAL LOW (ref 8.9–10.3)
Chloride: 105 mmol/L (ref 98–111)
Creatinine, Ser: 1.16 mg/dL — ABNORMAL HIGH (ref 0.44–1.00)
GFR, Estimated: 47 mL/min — ABNORMAL LOW (ref >60)
Glucose, Bld: 93 mg/dL (ref 70–99)
Potassium: 4 mmol/L (ref 3.5–5.1)
Sodium: 136 mmol/L (ref 135–145)

## 2022-08-09 LAB — MAGNESIUM: Magnesium: 1.8 mg/dL (ref 1.7–2.4)

## 2022-08-09 MED ORDER — METHOCARBAMOL 1000 MG/10ML IJ SOLN
500.0000 mg | Freq: Three times a day (TID) | INTRAVENOUS | Status: DC | PRN
  Administered 2022-08-09 – 2022-08-12 (×2): 500 mg via INTRAVENOUS
  Filled 2022-08-09: qty 500
  Filled 2022-08-09: qty 5

## 2022-08-09 NOTE — Progress Notes (Signed)
PROGRESS NOTE    Sydney Gillespie  [id number] DOB: February 22, 1941 DOA: 08/05/2022 PCP: Sydney Harrier, MD  [id number]  LOS: 3 days   Brief hospital course:   Assessment & Plan: Sydney Gillespie is a 82 y.o. female with medical history significant of multiple medical issues including Barrett's esophagus, compression fracture status post kyphoplasty March 20, hyperlipidemia, hypothyroidism, gout, stage IV CKD, DVT on Eliquis presenting with acute respiratory failure with hypoxia and aspiration pneumonia.  Patient noted to have been admitted recently for thoracic compression fractures with noted T8, T9 and T10 kyphoplasty and vertebral body biopsy by Dr. Arnoldo Morale on March 20.  Patient says overall stable pain since hospitalization.  Patient with significant increase shortness of breath over the past 12 to 24 hours.    Some remote history of dysphagia in the past per the grand daughter. O2 sats were dropping in to the upper 80s to low 90s at home.    MSSA bacteremia  --ID consulted --Echo, repeat blood cx, MRI thoracic spine, right thoracentesis with fluid studies --cont cefazolin  * Pneumonia New onset resp failure w/ hypoxia w/ noted LLL PNA concerning for possible aspiration event  Started on rocephin, azithromycin, flagyl in ER then switched to cefazolin, per ID --covid and RVP neg --SLP eval cleared for diet  Acute respiratory failure with hypoxia (HCC) New O2 requirement in setting of LLL PNA on CT concerning for aspiration PNA.  No evidence of PE.  Respiratory distress may also be exacerbated by anxiety. --Continue supplemental O2 to keep sats >=90%, wean as tolerated --Xanax PRN while inpatient  CKD 3b Hx of CKD 4, now improved to 3b  Gout --cont allopurinol  Hypothyroidism --cont Synthroid  Iron deficiency anemia --anemia workup showed iron def --transfused 1u pRBC (irradiated and leukocyte reduced) for Hgb 6.9 --monitor Hgb and transfuse to keep Hgb  >7  Thoracic compression fracture, with delayed healing, subsequent encounter S/p T8, T9 and T10 kyphoplasty and vertebral body biopsy 07/29/2022 by Dr. 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  Small amount of ventral epidural enhancing material"  Deep vein thrombosis (DVT)  --[place] found "Nonocclusive calf vein DVT involving the right posterior tibial vein", which is similar to finding back on 04/25/2022 --cont home Eliquis   DVT prophylaxis: Lovenox SQ Code Status: Full code  Family Communication:  Level of care: Med-Surg Dispo:   The patient is from: home Anticipated d/c is to: home Anticipated d/c date is: undetermined   Subjective and Interval History:  Pt complained of cramping from abdominal muscle when she moves a certain way.  Pain led to dyspnea which lead to anxiety.   Objective: Vitals:   08/08/22 1646 08/08/22 1939 08/09/22 0400 08/09/22 0942  BP: (!) 154/78 (!) 166/89 (!) 167/91 (!) 159/82  Pulse: 84 99 92 97  Resp: 18 20 16    Temp: 98.1 F (36.7 C) 98.3 F (36.8 C) 98 F (36.7 C) 99.1 F (37.3 C)  TempSrc:  Oral Oral Oral  SpO2: 99% 93% 96% 99%  Weight:      Height:        Intake/Output Summary (Last 24 hours) at 08/09/2022 1754 Last data filed at 08/09/2022 1052 Gross per 24 hour  Intake 832 ml  Output 0 ml  Net 832 ml   Filed Weights   08/05/22 0805  Weight: 59.9 kg    Examination:   Constitutional: NAD, AAOx3 HEENT: conjunctivae and lids normal, EOMI CV: No  cyanosis.   RESP: normal respiratory effort, on 2L Neuro: II - XII grossly intact.   Psych: Normal mood and affect.  Appropriate judgement and reason   Data Reviewed: I have personally reviewed labs and imaging studies  Time spent: 35 minutes  Enzo Bi, MD [facility] If 7PM-7AM, please contact night-coverage 08/09/2022, 5:54 PM

## 2022-08-09 NOTE — Progress Notes (Signed)
  Echocardiogram 2D Echocardiogram has been performed.  Sydney Gillespie 08/09/2022, 9:27 AM

## 2022-08-10 DIAGNOSIS — R7881 Bacteremia: Secondary | ICD-10-CM | POA: Diagnosis not present

## 2022-08-10 DIAGNOSIS — M4620 Osteomyelitis of vertebra, site unspecified: Secondary | ICD-10-CM | POA: Diagnosis not present

## 2022-08-10 DIAGNOSIS — M4644 Discitis, unspecified, thoracic region: Secondary | ICD-10-CM

## 2022-08-10 DIAGNOSIS — S22000G Wedge compression fracture of unspecified thoracic vertebra, subsequent encounter for fracture with delayed healing: Secondary | ICD-10-CM | POA: Diagnosis not present

## 2022-08-10 DIAGNOSIS — B9561 Methicillin susceptible Staphylococcus aureus infection as the cause of diseases classified elsewhere: Secondary | ICD-10-CM | POA: Diagnosis not present

## 2022-08-10 LAB — MAGNESIUM: Magnesium: 1.8 mg/dL (ref 1.7–2.4)

## 2022-08-10 LAB — CBC
HCT: 26.7 % — ABNORMAL LOW (ref 36.0–46.0)
Hemoglobin: 8.2 g/dL — ABNORMAL LOW (ref 12.0–15.0)
MCH: 30 pg (ref 26.0–34.0)
MCHC: 30.7 g/dL (ref 30.0–36.0)
MCV: 97.8 fL (ref 80.0–100.0)
Platelets: 265 10*3/uL (ref 150–400)
RBC: 2.73 MIL/uL — ABNORMAL LOW (ref 3.87–5.11)
RDW: 17.2 % — ABNORMAL HIGH (ref 11.5–15.5)
WBC: 8.3 10*3/uL (ref 4.0–10.5)
nRBC: 0 % (ref 0.0–0.2)

## 2022-08-10 LAB — TYPE AND SCREEN
ABO/RH(D): O POS
Antibody Screen: NEGATIVE
Unit division: 0

## 2022-08-10 LAB — BASIC METABOLIC PANEL
Anion gap: 6 (ref 5–15)
BUN: 22 mg/dL (ref 8–23)
CO2: 26 mmol/L (ref 22–32)
Calcium: 8.4 mg/dL — ABNORMAL LOW (ref 8.9–10.3)
Chloride: 105 mmol/L (ref 98–111)
Creatinine, Ser: 1.02 mg/dL — ABNORMAL HIGH (ref 0.44–1.00)
GFR, Estimated: 55 mL/min — ABNORMAL LOW (ref >60)
Glucose, Bld: 97 mg/dL (ref 70–99)
Potassium: 4.1 mmol/L (ref 3.5–5.1)
Sodium: 137 mmol/L (ref 135–145)

## 2022-08-10 LAB — PATHOLOGIST SMEAR REVIEW

## 2022-08-10 LAB — BPAM RBC
Blood Product Expiration Date: 202404192359
ISSUE DATE / TIME: 202403301627
Unit Type and Rh: 5100

## 2022-08-10 MED ORDER — METHOCARBAMOL 500 MG PO TABS
500.0000 mg | ORAL_TABLET | Freq: Two times a day (BID) | ORAL | Status: DC
  Administered 2022-08-10 – 2022-08-12 (×4): 500 mg via ORAL
  Filled 2022-08-10 (×4): qty 1

## 2022-08-10 MED ORDER — HYDROCODONE-ACETAMINOPHEN 5-325 MG PO TABS
1.0000 | ORAL_TABLET | ORAL | Status: DC | PRN
  Administered 2022-08-10 – 2022-08-12 (×2): 1 via ORAL
  Filled 2022-08-10 (×2): qty 1

## 2022-08-10 NOTE — TOC Progression Note (Signed)
Transition of Care [facility]) - Progression Note    Patient Details  Name: Sydney Gillespie MRN: [id number] Date of Birth: Dec 11, 1940  Transition of Care [facility]) CM/SW Contact  Beverly Sessions, RN Phone Number: 08/10/2022, 9:36 AM  Clinical Narrative:     Patient remains on acute O2     Barriers to Discharge: Continued Medical Work up  Expected Discharge Plan and Services     Post Acute Care Choice: NA Living arrangements for the past 2 months: Single Family Home                                       Social Determinants of Health (SDOH) Interventions SDOH Screenings   Food Insecurity: No Food Insecurity (08/05/2022)  Housing: Low Risk  (08/05/2022)  Transportation Needs: No Transportation Needs (08/05/2022)  Utilities: Not At Risk (08/05/2022)  Tobacco Use: Low Risk  (08/05/2022)    Readmission Risk Interventions    08/06/2022    3:47 PM 05/10/2022    2:56 PM  Readmission Risk Prevention Plan  Transportation Screening Complete Complete  Medication Review ([place]) Complete Complete  PCP or Specialist appointment within 3-5 days of discharge Complete Complete  HRI or [place]  Complete  SW Recovery Care/Counseling Consult Complete Complete  Palliative Care Screening Not Applicable Not [place] Not Applicable Not Applicable

## 2022-08-10 NOTE — Progress Notes (Signed)
Date of Admission:  08/05/2022      ID: Sydney Gillespie is a 82 y.o. female  Principal Problem:   Pneumonia Active Problems:   Gout   Hypothyroidism   Iron deficiency anemia   CKD (chronic kidney disease), stage IV   Acute respiratory failure with hypoxia   Acute deep vein thrombosis (DVT) of calf muscle vein of right lower extremity   Thoracic compression fracture, with delayed healing, subsequent encounter   CAP (community acquired pneumonia)   MSSA bacteremia    Subjective: Patient doing better Some pain at the thoracic spine No fever  Medications:   allopurinol  300 mg Oral Daily   apixaban  5 mg Oral BID   atenolol  50 mg Oral Daily   docusate sodium  100 mg Oral BID   DULoxetine  20 mg Oral BID   levothyroxine  112 mcg Oral Q0600   mirabegron ER  50 mg Oral Daily   OLANZapine  10 mg Oral QHS   oxybutynin  10 mg Oral Daily   pantoprazole  40 mg Oral BID AC   rOPINIRole  0.5 mg Oral QHS   tiZANidine  2 mg Oral BID    Objective: Vital signs in last 24 hours: Patient Vitals for the past 24 hrs:  BP Temp Temp src Pulse Resp SpO2  08/10/22 1453 (!) 160/80 98.3 F (36.8 C) -- 82 20 100 %  08/10/22 0809 (!) 159/81 97.9 F (36.6 C) Oral 75 18 100 %  08/10/22 0502 (!) 144/85 98.2 F (36.8 C) Oral 79 16 100 %  08/09/22 2023 (!) 165/85 99.2 F (37.3 C) Oral 85 20 100 %       PHYSICAL EXAM:  General: Alert, cooperative, no distress, appears stated age.  Head: Normocephalic, without obvious abnormality, atraumatic. Eyes: Conjunctivae clear, anicteric sclerae. Pupils are equal ENT Nares normal. No drainage or sinus tenderness. Lips, mucosa, and tongue normal. No Thrush Neck: Supple, symmetrical, no adenopathy, thyroid: non tender no carotid bruit and no JVD. Back: thoracic gibbus Lungs: b/la ir entry Decreased rt base Heart: s1s2 Abdomen: Soft, non-tender,not distended. Bowel sounds normal. No masses Extremities: atraumatic, no cyanosis. No edema. No  clubbing Skin: No rashes or lesions. Or bruising Lymph: Cervical, supraclavicular normal. Neurologic: Grossly non-focal  Lab Results    Latest Ref Rng & Units 08/10/2022    5:28 AM 08/09/2022    3:07 AM 08/08/2022    2:51 AM  CBC  WBC 4.0 - 10.5 K/uL 8.3  12.1  10.8   Hemoglobin 12.0 - 15.0 g/dL 8.2  8.9  6.9   Hematocrit 36.0 - 46.0 % 26.7  28.4  22.8   Platelets 150 - 400 K/uL 265  294  292        Latest Ref Rng & Units 08/10/2022    5:28 AM 08/09/2022    3:07 AM 08/08/2022    2:51 AM  CMP  Glucose 70 - 99 mg/dL 97  93  140   BUN 8 - 23 mg/dL 22  27  30    Creatinine 0.44 - 1.00 mg/dL 1.02  1.16  1.14   Sodium 135 - 145 mmol/L 137  136  135   Potassium 3.5 - 5.1 mmol/L 4.1  4.0  4.6   Chloride 98 - 111 mmol/L 105  105  101   CO2 22 - 32 mmol/L 26  23  22    Calcium 8.9 - 10.3 mg/dL 8.4  8.4  8.0  Microbiology: 08/05/22 2/2 sets MSSA 3/29 BC sent Studies/Results: ECHOCARDIOGRAM COMPLETE  Result Date: 08/09/2022    ECHOCARDIOGRAM REPORT   Patient Name:   Sydney Gillespie Date of Exam: 08/09/2022 Medical Rec #:  [id number]          Height:       65.0 in Accession #:    [id number]         Weight:       132.0 lb Date of Birth:  02/16/1941           BSA:          1.658 m Patient Age:    22 years           BP:           167/91 mmHg Patient Gender: F                  HR:           102 bpm. Exam Location:  [facility] Procedure: 2D Echo Indications:     Bacteremia R78.81  History:         Patient has prior history of Echocardiogram examinations, most                  recent 05/07/2022.  Sonographer:     Kathlen Brunswick RDCS Referring Phys:  [id number] [place] Diagnosing Phys: Carlyle Dolly MD  Sonographer Comments: Suboptimal subcostal window. Image acquisition challenging due to respiratory motion. IMPRESSIONS  1. Left ventricular ejection fraction, by estimation, is 35 to 40%. The left ventricle has moderately decreased function. The left ventricle demonstrates global hypokinesis. There is mild  left ventricular hypertrophy. Left ventricular diastolic parameters are indeterminate. Elevated left atrial pressure.  2. Right ventricular systolic function is normal. The right ventricular size is normal.  3. Left atrial size was severely dilated.  4. The mitral valve is abnormal. Moderate mitral valve regurgitation. No evidence of mitral stenosis.  5. The tricuspid valve is abnormal.  6. The aortic valve is tricuspid. There is mild calcification of the aortic valve. There is mild thickening of the aortic valve. Aortic valve regurgitation is moderate. No aortic stenosis is present. FINDINGS  Left Ventricle: Left ventricular ejection fraction, by estimation, is 35 to 40%. The left ventricle has moderately decreased function. The left ventricle demonstrates global hypokinesis. The left ventricular internal cavity size was normal in size. There is mild left ventricular hypertrophy. Left ventricular diastolic parameters are indeterminate. Elevated left atrial pressure. Right Ventricle: Unable to estimated PASP, IVC poorly visualized. The right ventricular size is normal. Right vetricular wall thickness was not well visualized. Right ventricular systolic function is normal. Left Atrium: Left atrial size was severely dilated. Right Atrium: Right atrial size was normal in size. Pericardium: The pericardium was not well visualized. Mitral Valve: The mitral valve is abnormal. Mild mitral annular calcification. Moderate mitral valve regurgitation. No evidence of mitral valve stenosis. MV peak gradient, 10.2 mmHg. The mean mitral valve gradient is 5.0 mmHg. Tricuspid Valve: The tricuspid valve is abnormal. Tricuspid valve regurgitation is mild . No evidence of tricuspid stenosis. Aortic Valve: The aortic valve is tricuspid. There is mild calcification of the aortic valve. There is mild thickening of the aortic valve. There is mild aortic valve annular calcification. Aortic valve regurgitation is moderate. Aortic  regurgitation PHT  measures 297 msec. No aortic stenosis is present. Aortic valve mean gradient measures 6.3 mmHg. Aortic valve peak gradient measures 12.1 mmHg. Aortic valve area, by VTI measures  1.35 cm. Pulmonic Valve: The pulmonic valve was not well visualized. Pulmonic valve regurgitation is not visualized. No evidence of pulmonic stenosis. Aorta: The aortic root is normal in size and structure. Venous: The inferior vena cava was not well visualized. IAS/Shunts: The interatrial septum was not well visualized.  LEFT VENTRICLE PLAX 2D LVIDd:         3.60 cm      Diastology LVIDs:         3.00 cm      LV e' medial:    5.55 cm/s LV PW:         1.10 cm      LV E/e' medial:  20.0 LV IVS:        1.10 cm      LV e' lateral:   6.31 cm/s LVOT diam:     1.90 cm      LV E/e' lateral: 17.6 LV SV:         43 LV SV Index:   26 LVOT Area:     2.84 cm  LV Volumes (MOD) LV vol d, MOD A2C: 66.3 ml LV vol d, MOD A4C: 104.0 ml LV vol s, MOD A2C: 34.4 ml LV vol s, MOD A4C: 61.7 ml LV SV MOD A2C:     31.9 ml LV SV MOD A4C:     104.0 ml LV SV MOD BP:      34.2 ml RIGHT VENTRICLE RV Basal diam:  2.00 cm RV S prime:     18.40 cm/s TAPSE (M-mode): 1.6 cm LEFT ATRIUM             Index        RIGHT ATRIUM          Index LA diam:        5.40 cm 3.26 cm/m   RA Area:     8.97 cm LA Vol (A2C):   92.6 ml 55.85 ml/m  RA Volume:   14.70 ml 8.87 ml/m LA Vol (A4C):   89.6 ml 54.04 ml/m LA Biplane Vol: 95.0 ml 57.30 ml/m  AORTIC VALVE                     PULMONIC VALVE AV Area (Vmax):    1.38 cm      PV Vmax:        1.03 m/s AV Area (Vmean):   1.34 cm      PV Peak grad:   4.2 mmHg AV Area (VTI):     1.35 cm      RVOT Peak grad: 4 mmHg AV Vmax:           173.84 cm/s AV Vmean:          120.488 cm/s AV VTI:            0.321 m AV Peak Grad:      12.1 mmHg AV Mean Grad:      6.3 mmHg LVOT Vmax:         84.90 cm/s LVOT Vmean:        56.900 cm/s LVOT VTI:          0.153 m LVOT/AV VTI ratio: 0.48 AI PHT:            297 msec  AORTA Ao Root diam:  3.20 cm Ao Asc diam:  2.90 cm MITRAL VALVE                TRICUSPID VALVE MV Area (PHT): 7.59 cm  TR Peak grad:   34.1 mmHg MV Area VTI:   1.37 cm     TR Vmax:        292.00 cm/s MV Peak grad:  10.2 mmHg MV Mean grad:  5.0 mmHg     SHUNTS MV Vmax:       1.60 m/s     Systemic VTI:  0.15 m MV Vmean:      103.0 cm/s   Systemic Diam: 1.90 cm MV Decel Time: 100 msec MR Peak grad: 156.8 mmHg MR Mean grad: 105.0 mmHg MR Vmax:      626.00 cm/s MR Vmean:     483.0 cm/s MV E velocity: 111.00 cm/s MV A velocity: 142.00 cm/s MV E/A ratio:  0.78 Carlyle Dolly MD Electronically signed by Carlyle Dolly MD Signature Date/Time: 08/09/2022/10:45:10 AM    Final      Feb 2024   2023    Assessment/Plan: ?pt presenting with acute SOB   MSSA bacteremia 07/29/22 underwent kyphoplasty of T8-T9-T10 for compression fracture and a bone biopsy  was done /pathology of the bone was acute osteomyelitis MRI done 3/29 shows discitis T8-T9 and they referenced MRI from 2/29 which had features of discitis and epidural phlegmon Repeat Blood culture negative 2 d echo no vegetation No need to get TEE as the management will not change as she will need 6 weeks of IV antibiotic On cefazolin will need for 6-8 weeks   Esophageal dysmotility with risk for aspiration'   GGO and tree in bud left lower lung concerning for aspiration pneumoniits Procal < 0.10 so doubt it is pneumonia   Rheumatoid arthritis on plaquenil   Multiple myeloma s/p SCT Chemo on hold since she had covid Dec 2023     Recent kyphoplasty for Thoracic spine compression fractures   Anemia   CKD- need to check with Dr.Singh/ Dr.Kolluru whether PICC can be placed on discharge  Discussed with patient and hospitalist Patient states her Medicare does not have medication coverage and she wants to know ahead of time how much  co-pay for home IV antibiotics.  She would prefer to come to the day surgery to get IV antibiotics.  Cefazolin is twice a day to 3  times a day injection and that would not be a possibility.  Will ask infusion company to check the cost of cefazolin.

## 2022-08-10 NOTE — TOC Progression Note (Signed)
Transition of Care [facility]) - Progression Note    Patient Details  Name: Sydney Gillespie MRN: [id number] Date of Birth: 08/13/1940  Transition of Care [facility]) CM/SW Contact  Beverly Sessions, RN Phone Number: 08/10/2022, 12:42 PM  Clinical Narrative:      Notified by Corene Cornea with [facility] that patient acute with PT   Barriers to Discharge: Continued Medical Work up  Expected Discharge Plan and Services     Post Acute Care Choice: NA Living arrangements for the past 2 months: Single Family Home                                       Social Determinants of Health (SDOH) Interventions SDOH Screenings   Food Insecurity: No Food Insecurity (08/05/2022)  Housing: Low Risk  (08/05/2022)  Transportation Needs: No Transportation Needs (08/05/2022)  Utilities: Not At Risk (08/05/2022)  Tobacco Use: Low Risk  (08/05/2022)    Readmission Risk Interventions    08/06/2022    3:47 PM 05/10/2022    2:56 PM  Readmission Risk Prevention Plan  Transportation Screening Complete Complete  Medication Review ([place]) Complete Complete  PCP or Specialist appointment within 3-5 days of discharge Complete Complete  HRI or [place]  Complete  SW Recovery Care/Counseling Consult Complete Complete  Palliative Care Screening Not Applicable Not [place] Not Applicable Not Applicable

## 2022-08-10 NOTE — Progress Notes (Signed)
PROGRESS NOTE    Sydney Gillespie  [id number] DOB: 01/27/1941 DOA: 08/05/2022 PCP: [name], MD  213A/213A-AA  LOS: 4 days   Brief hospital course:   Assessment & Plan: Sydney Gillespie is a 82 y.o. female with medical history significant of multiple medical issues including Barrett's esophagus, compression fracture status post kyphoplasty March 20, hyperlipidemia, hypothyroidism, gout, stage IV CKD, DVT on Eliquis presenting with acute respiratory failure with hypoxia and aspiration pneumonia.  Patient noted to have been admitted recently for thoracic compression fractures with noted T8, T9 and T10 kyphoplasty and vertebral body biopsy by Dr. Arnoldo Morale on March 20.  Patient says overall stable pain since hospitalization.  Patient with significant increase shortness of breath over the past 12 to 24 hours.    Some remote history of dysphagia in the past per the grand daughter. O2 sats were dropping in to the upper 80s to low 90s at home.    MSSA bacteremia  --ID consulted --Echo no vegetation.  No need for TEE since need to give long-term abx anyways for osteo --repeat blood cx neg --cont cefazolin  * Pneumonia New onset resp failure w/ hypoxia w/ noted LLL PNA concerning for possible aspiration event  Started on rocephin, azithromycin, flagyl in ER then switched to cefazolin, per ID --covid and RVP neg --SLP eval cleared for diet  Acute respiratory failure with hypoxia (HCC) New O2 requirement in setting of LLL PNA on CT concerning for aspiration PNA.  No evidence of PE.  Respiratory distress may also be exacerbated by anxiety. --Continue supplemental O2 to keep sats >=90%, wean as tolerated --Xanax PRN while inpatient  CKD 3b Hx of CKD 4, now improved to 3b  Gout --cont allopurinol  Hypothyroidism --cont Synthroid  Iron deficiency anemia --anemia workup showed iron def --transfused 1u pRBC (irradiated and leukocyte reduced) for Hgb 6.9 --monitor Hgb and  transfuse to keep Hgb >7  Thoracic compression fracture, with delayed healing, subsequent encounter S/p T8, T9 and T10 kyphoplasty and vertebral body biopsy 07/29/2022 by Dr. 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  Small amount of ventral epidural enhancing material"  Deep vein thrombosis (DVT)  --[place] found "Nonocclusive calf vein DVT involving the right posterior tibial vein", which is similar to finding back on 04/25/2022 --cont home Eliquis  Abdominal cramping --IV robaxin worked better. --change zanaflex to robaxin --IV robaxin PRN   DVT prophylaxis: Lovenox SQ Code Status: Full code  Family Communication: granddaughter updated at bedside today Level of care: Med-Surg Dispo:   The patient is from: home Anticipated d/c is to: home Anticipated d/c date is: 1-2 days   Subjective and Interval History:  Pt reported IV robaxin helped with abdominal cramping.  Complained of back pain today.   Objective: Vitals:   08/10/22 0502 08/10/22 0809 08/10/22 1453 08/10/22 2034  BP: (!) 144/85 (!) 159/81 (!) 160/80 (!) 155/80  Pulse: 79 75 82 84  Resp: 16 18 20 16   Temp: 98.2 F (36.8 C) 97.9 F (36.6 C) 98.3 F (36.8 C) 98.9 F (37.2 C)  TempSrc: Oral Oral  Oral  SpO2: 100% 100% 100% 96%  Weight:      Height:        Intake/Output Summary (Last 24 hours) at 08/10/2022 2154 Last data filed at 08/10/2022 0143 Gross per 24 hour  Intake 155.22 ml  Output 0 ml  Net 155.22 ml   Filed Weights   08/05/22 0805  Weight: 59.9 kg    Examination:   Constitutional: NAD, AAOx3 HEENT: conjunctivae and lids normal, EOMI CV: No cyanosis.   RESP: normal respiratory effort, on RA Neuro: II - XII grossly intact.   Psych: Normal mood and affect.  Appropriate judgement and reason   Data Reviewed: I have personally reviewed labs and imaging studies  Time spent: 35 minutes  Enzo Bi, MD [facility] If 7PM-7AM, please contact night-coverage 08/10/2022, 9:54 PM

## 2022-08-10 NOTE — Care Management Important Message (Signed)
Important Message  Patient Details  Name: Sydney Gillespie MRN: [id number] Date of Birth: Apr 29, 1941   Medicare Important Message Given:  Yes     Dannette Barbara 08/10/2022, 11:54 AM

## 2022-08-11 ENCOUNTER — Other Ambulatory Visit: Payer: Self-pay

## 2022-08-11 DIAGNOSIS — R7881 Bacteremia: Secondary | ICD-10-CM | POA: Diagnosis not present

## 2022-08-11 DIAGNOSIS — B9561 Methicillin susceptible Staphylococcus aureus infection as the cause of diseases classified elsewhere: Secondary | ICD-10-CM | POA: Diagnosis not present

## 2022-08-11 LAB — C-REACTIVE PROTEIN: CRP: 5.5 mg/dL — ABNORMAL HIGH (ref <1.0)

## 2022-08-11 LAB — BODY FLUID CULTURE W GRAM STAIN
Culture: NO GROWTH
Gram Stain: NONE SEEN

## 2022-08-11 LAB — SEDIMENTATION RATE: Sed Rate: 44 mm/hr — ABNORMAL HIGH (ref 0–30)

## 2022-08-11 MED ORDER — SODIUM CHLORIDE 0.9% FLUSH
10.0000 mL | Freq: Two times a day (BID) | INTRAVENOUS | Status: DC
  Administered 2022-08-11 – 2022-08-12 (×2): 10 mL

## 2022-08-11 MED ORDER — POLYSACCHARIDE IRON COMPLEX 150 MG PO CAPS
150.0000 mg | ORAL_CAPSULE | Freq: Every day | ORAL | Status: DC
  Administered 2022-08-12: 150 mg via ORAL
  Filled 2022-08-11: qty 1

## 2022-08-11 MED ORDER — POLYETHYLENE GLYCOL 3350 17 G PO PACK
34.0000 g | PACK | ORAL | Status: AC
  Administered 2022-08-11 (×5): 34 g via ORAL
  Filled 2022-08-11 (×5): qty 2

## 2022-08-11 MED ORDER — CHLORHEXIDINE GLUCONATE CLOTH 2 % EX PADS
6.0000 | MEDICATED_PAD | Freq: Every day | CUTANEOUS | Status: DC
  Administered 2022-08-11 – 2022-08-12 (×2): 6 via TOPICAL

## 2022-08-11 MED ORDER — SODIUM CHLORIDE 0.9% FLUSH: 10.0000 mL | INTRAVENOUS | Status: DC | PRN

## 2022-08-11 MED ORDER — BISACODYL 10 MG RE SUPP
10.0000 mg | Freq: Every day | RECTAL | Status: DC | PRN
  Administered 2022-08-11: 10 mg via RECTAL
  Filled 2022-08-11: qty 1

## 2022-08-11 NOTE — Treatment Plan (Signed)
Diagnosis: MSSA bacteremia and Thoracic diskitis and osteomyelitis Baseline Creatinine 1.02    Allergies  Allergen Reactions   Isoniazid Other (See Comments)    Blisters     OPAT Orders Discharge antibiotics: Cefazolin 2 grams IV every 8 hours Duration: 6 weeks End Date: 09/17/22  [facility] Care Per Protocol:  Labs weekly while on IV antibiotics: _X_ CBC with differential  _X_ CMP X__ CRP _X_ ESR   _X_ Please pull PIC at completion of IV antibiotics  Fax weekly lab results  promptly to [phone]  Clinic Follow Up Appt: 09/17/22 at 10.30 AM   Call [phone] with any questions

## 2022-08-11 NOTE — TOC Progression Note (Signed)
Transition of Care [facility]) - Progression Note    Patient Details  Name: Sydney Gillespie MRN: [id number] Date of Birth: [birth year]  Transition [facility]) CM/SW Contact  Beverly Sessions, RN Phone Number: 08/11/2022, 2:37 PM  Clinical Narrative:     Per MD patient will require IV antibiotics at discharge Referral made to [facility] with [facility]. Notified Corene Cornea with [place]     Barriers to Discharge: Continued Medical Work up  Expected Discharge Plan and Services     Post Acute Care Choice: NA Living arrangements for the past 2 months: Single Family Home                                       Social Determinants of Health (SDOH) Interventions SDOH Screenings   Food Insecurity: No Food Insecurity (08/05/2022)  Housing: Low Risk  (08/05/2022)  Transportation Needs: No Transportation Needs (08/05/2022)  Utilities: Not At Risk (08/05/2022)  Tobacco Use: Low Risk  (08/05/2022)    Readmission Risk Interventions    08/06/2022    3:47 PM 05/10/2022    2:56 PM  Readmission Risk Prevention Plan  Transportation Screening Complete Complete  Medication Review ([place]) Complete Complete  PCP or Specialist appointment within 3-5 days of discharge Complete Complete  HRI or [place]  Complete  SW Recovery Care/Counseling Consult Complete Complete  Palliative Care Screening Not Applicable Not [place] Not Applicable Not Applicable

## 2022-08-11 NOTE — Progress Notes (Signed)
Peripherally Inserted Central Catheter Placement  The IV Nurse has discussed with the patient and/or persons authorized to consent for the patient, the purpose of this procedure and the potential benefits and risks involved with this procedure.  The benefits include less needle sticks, lab draws from the catheter, and the patient may be discharged home with the catheter. Risks include, but not limited to, infection, bleeding, blood clot (thrombus formation), and puncture of an artery; nerve damage and irregular heartbeat and possibility to perform a PICC exchange if needed/ordered by physician.  Alternatives to this procedure were also discussed.  Bard Power PICC patient education guide, fact sheet on infection prevention and patient information card has been provided to patient /or left at bedside.    PICC Placement Documentation  PICC Single Lumen 08/11/22 Right Brachial 37 cm 0 cm (Active)  Indication for Insertion or Continuance of Line Home intravenous therapies (PICC only) 08/11/22 1557  Exposed Catheter (cm) 0 cm 08/11/22 1557  Site Assessment Clean, Dry, Intact 08/11/22 1557  Line Status Flushed;Blood return noted;Saline locked 08/11/22 1557  Dressing Type Transparent 08/11/22 1557  Dressing Status Antimicrobial disc in place 08/11/22 [place] Not Applicable [id number]  Line Care Connections checked and tightened 08/11/22 1557  Line Adjustment (NICU/IV Team Only) No 08/11/22 1557  Dressing Intervention New dressing 08/11/22 1557  Dressing Change Due 08/18/22 08/11/22 1557       Scotty Court 08/11/2022, 4:00 PM

## 2022-08-11 NOTE — Progress Notes (Signed)
PROGRESS NOTE    Sydney Gillespie  [id number] DOB: 08-29-1940 DOA: 08/05/2022 PCP: [name], MD  [id number]/[id number]-AA  LOS: 5 days   Brief hospital course:   Assessment & Plan: Sydney Gillespie is a 82 y.o. female with medical history significant of Barrett's esophagus, compression fracture status post kyphoplasty March 20, hypothyroidism, gout, CKD, DVT on Eliquis presenting with acute respiratory failure with hypoxia and aspiration pneumonia.    Patient noted to have been admitted recently for thoracic compression fractures with noted T8, T9 and T10 kyphoplasty and vertebral body biopsy by Dr. Arnoldo Morale on March 20.  Patient says overall stable pain since hospitalization.  Patient with significant increase shortness of breath over the past 12 to 24 hours PTA.  Some remote history of dysphagia in the past per the grand daughter. O2 sats were dropping in to the upper 80s to low 90s at home.    MSSA bacteremia  Thoracic diskitis and osteomyelitis  --MRI on 07/01/22 had already shown "discitis and osteomyelitis at T8-9 and T9-10".   Biopsy taken during kyphoplasty on 07/29/22 returned pos for acute and chronic osteomyelitis. --ID consulted --Echo no vegetation.  No need for TEE since need to give long-term abx anyways for osteo.  Repeat MRI showed "Persistent findings of T8-9-10 diskitis-osteomyelitis. Small amount of ventral epidural enhancing material." --repeat blood cx neg Plan: --place PICC line today --cont cefazolin, will discharge home with IV cefazolin for total of 6 weeks, End Date: 09/17/22. --ID Clinic Follow Up Appt: 09/17/22 at 10.30 AM   * Pneumonia New onset resp failure w/ hypoxia w/ noted LLL PNA concerning for possible aspiration event  Started on rocephin, azithromycin, flagyl in ER then switched to cefazolin, per ID --covid and RVP neg --SLP eval cleared for diet  Acute respiratory failure with hypoxia (HCC) New O2 requirement in setting of LLL PNA on CT  concerning for aspiration PNA.  No evidence of PE.  Respiratory distress may also be exacerbated by anxiety. --now on room air --Xanax PRN while inpatient  CKD 3b Hx of CKD 4, now improved to 3b  Gout --cont allopurinol  Hypothyroidism --cont Synthroid  Iron deficiency anemia --anemia workup showed iron def --transfused 1u pRBC (irradiated and leukocyte reduced) for Hgb 6.9 --did not give IV iron due to bacteremia --start oral iron supplement  Thoracic compression fracture, with delayed healing, subsequent encounter S/p T8, T9 and T10 kyphoplasty and vertebral body biopsy 07/29/2022 by Dr. Arnoldo Morale w/ NSG  Appears stable, pain improved --Norco PRN  Deep vein thrombosis (DVT)  --[place] found "Nonocclusive calf vein DVT involving the right posterior tibial vein", which is similar to finding back on 04/25/2022 --cont home Eliquis  Abdominal cramping --IV robaxin worked better. --changed zanaflex to robaxin BID --IV robaxin PRN   DVT prophylaxis: Lovenox SQ Code Status: Full code  Family Communication: Level of care: Med-Surg Dispo:   The patient is from: home Anticipated d/c is to: home Anticipated d/c date is: tomorrow, after home IV abx set up, and teaching done with granddaughter.   Subjective and Interval History:  Back pain improved today.  Pt reported no BM since presentation.   Objective: Vitals:   08/10/22 2034 08/11/22 0535 08/11/22 0721 08/11/22 1509  BP: (!) 155/80 (!) 153/75 (!) 146/77 (!) 152/77  Pulse: 84 81 81 87  Resp: 16 16 20 20   Temp: 98.9 F (37.2 C) 98.3 F (36.8 C) 98.5 F (36.9 C) 98 F (36.7 C)  TempSrc: Oral Oral    SpO2:  96% 94% 94% 98%  Weight:      Height:        Intake/Output Summary (Last 24 hours) at 08/11/2022 2109 Last data filed at 08/11/2022 1926 Gross per 24 hour  Intake 1200 ml  Output 0 ml  Net 1200 ml   Filed Weights   08/05/22 0805  Weight: 59.9 kg    Examination:   Constitutional: NAD, AAOx3 HEENT: conjunctivae  and lids normal, EOMI CV: No cyanosis.   RESP: normal respiratory effort, on RA Neuro: II - XII grossly intact.   Psych: Normal mood and affect.  Appropriate judgement and reason   Data Reviewed: I have personally reviewed labs and imaging studies  Time spent: 50 minutes  Enzo Bi, MD [facility] If 7PM-7AM, please contact night-coverage 08/11/2022, 9:09 PM

## 2022-08-11 NOTE — Consult Note (Signed)
PHARMACY CONSULT NOTE FOR:  OUTPATIENT  PARENTERAL ANTIBIOTIC THERAPY (OPAT)  Indication: MSSA bacteremia and Thoracic diskitis and osteomyelitis  Regimen: Cefazolin 2 gram IV Q8H x 6 weeks End date: 09/17/2022  IV antibiotic discharge orders are pended. To discharging provider:  please sign these orders via discharge navigator,  Select New Orders & click on the button choice - Manage This Unsigned Work.     Thank you for allowing pharmacy to be a part of this patient's care.  Dorothe Pea, PharmD, BCPS Clinical Pharmacist   08/11/2022, 9:20 PM

## 2022-08-12 DIAGNOSIS — M4624 Osteomyelitis of vertebra, thoracic region: Secondary | ICD-10-CM

## 2022-08-12 DIAGNOSIS — B9561 Methicillin susceptible Staphylococcus aureus infection as the cause of diseases classified elsewhere: Secondary | ICD-10-CM | POA: Diagnosis not present

## 2022-08-12 DIAGNOSIS — M4644 Discitis, unspecified, thoracic region: Secondary | ICD-10-CM | POA: Diagnosis not present

## 2022-08-12 DIAGNOSIS — R7881 Bacteremia: Secondary | ICD-10-CM | POA: Diagnosis not present

## 2022-08-12 LAB — CULTURE, BLOOD (ROUTINE X 2)
Culture: NO GROWTH
Culture: NO GROWTH
Special Requests: ADEQUATE

## 2022-08-12 LAB — BASIC METABOLIC PANEL
Anion gap: 7 (ref 5–15)
BUN: 17 mg/dL (ref 8–23)
CO2: 24 mmol/L (ref 22–32)
Calcium: 8.5 mg/dL — ABNORMAL LOW (ref 8.9–10.3)
Chloride: 105 mmol/L (ref 98–111)
Creatinine, Ser: 1.04 mg/dL — ABNORMAL HIGH (ref 0.44–1.00)
GFR, Estimated: 54 mL/min — ABNORMAL LOW (ref >60)
Glucose, Bld: 112 mg/dL — ABNORMAL HIGH (ref 70–99)
Potassium: 3.8 mmol/L (ref 3.5–5.1)
Sodium: 136 mmol/L (ref 135–145)

## 2022-08-12 LAB — MAGNESIUM: Magnesium: 1.5 mg/dL — ABNORMAL LOW (ref 1.7–2.4)

## 2022-08-12 LAB — CBC
HCT: 29.3 % — ABNORMAL LOW (ref 36.0–46.0)
Hemoglobin: 9.3 g/dL — ABNORMAL LOW (ref 12.0–15.0)
MCH: 30.3 pg (ref 26.0–34.0)
MCHC: 31.7 g/dL (ref 30.0–36.0)
MCV: 95.4 fL (ref 80.0–100.0)
Platelets: 291 10*3/uL (ref 150–400)
RBC: 3.07 MIL/uL — ABNORMAL LOW (ref 3.87–5.11)
RDW: 16.5 % — ABNORMAL HIGH (ref 11.5–15.5)
WBC: 10.8 10*3/uL — ABNORMAL HIGH (ref 4.0–10.5)
nRBC: 0 % (ref 0.0–0.2)

## 2022-08-12 MED ORDER — MAGNESIUM SULFATE 2 GM/50ML IV SOLN
2.0000 g | Freq: Once | INTRAVENOUS | Status: AC
  Administered 2022-08-12: 2 g via INTRAVENOUS
  Filled 2022-08-12: qty 50

## 2022-08-12 MED ORDER — SENNOSIDES-DOCUSATE SODIUM 8.6-50 MG PO TABS
1.0000 | ORAL_TABLET | Freq: Two times a day (BID) | ORAL | Status: DC
  Administered 2022-08-12: 1 via ORAL
  Filled 2022-08-12: qty 1

## 2022-08-12 MED ORDER — GUAIFENESIN-DM 100-10 MG/5ML PO SYRP
5.0000 mL | ORAL_SOLUTION | ORAL | Status: DC | PRN
  Administered 2022-08-12: 5 mL via ORAL
  Filled 2022-08-12: qty 10

## 2022-08-12 MED ORDER — SENNOSIDES-DOCUSATE SODIUM 8.6-50 MG PO TABS: 1.0000 | ORAL_TABLET | Freq: Two times a day (BID) | ORAL | 0 refills | Status: AC

## 2022-08-12 MED ORDER — HYDROCODONE-ACETAMINOPHEN 5-325 MG PO TABS: 1.0000 | ORAL_TABLET | ORAL | 0 refills | Status: DC | PRN

## 2022-08-12 MED ORDER — CEFAZOLIN IV (FOR PTA / DISCHARGE USE ONLY)
2.0000 g | Freq: Three times a day (TID) | INTRAVENOUS | 0 refills | Status: DC
Start: 2022-08-12 — End: 2022-08-17

## 2022-08-12 MED ORDER — POLYSACCHARIDE IRON COMPLEX 150 MG PO CAPS: 150.0000 mg | ORAL_CAPSULE | Freq: Every day | ORAL | 1 refills | Status: AC

## 2022-08-12 MED ORDER — TIZANIDINE HCL 2 MG PO TABS: 2.0000 mg | ORAL_TABLET | Freq: Two times a day (BID) | ORAL | 0 refills | Status: DC | PRN

## 2022-08-12 MED ORDER — ATENOLOL 50 MG PO TABS: 50.0000 mg | ORAL_TABLET | Freq: Every day | ORAL | 1 refills | Status: DC

## 2022-08-12 NOTE — Progress Notes (Signed)
Grand daughter at bedside Pt doing better No chest tightness Back pain better  O/e awake and alert Patient Vitals for the past 24 hrs:  BP Temp Temp src Pulse Resp SpO2  08/12/22 0845 (!) 160/83 98.2 F (36.8 C) -- 95 18 96 %  08/12/22 0600 (!) 161/86 98.3 F (36.8 C) Oral 95 15 93 %  08/11/22 2114 (!) 166/84 98.7 F (37.1 C) Oral 91 17 96 %   Chest b/l air entry- decreased bases Hss1s2 Abd soft CNS non focal Rt PICC  Labs    Latest Ref Rng & Units 08/12/2022    4:59 AM 08/10/2022    5:28 AM 08/09/2022    3:07 AM  CBC  WBC 4.0 - 10.5 K/uL 10.8  8.3  12.1   Hemoglobin 12.0 - 15.0 g/dL 9.3  8.2  8.9   Hematocrit 36.0 - 46.0 % 29.3  26.7  28.4   Platelets 150 - 400 K/uL 291  265  294        Latest Ref Rng & Units 08/12/2022    4:59 AM 08/10/2022    5:28 AM 08/09/2022    3:07 AM  CMP  Glucose 70 - 99 mg/dL 112  97  93   BUN 8 - 23 mg/dL 17  22  27    Creatinine 0.44 - 1.00 mg/dL 1.04  1.02  1.16   Sodium 135 - 145 mmol/L 136  137  136   Potassium 3.5 - 5.1 mmol/L 3.8  4.1  4.0   Chloride 98 - 111 mmol/L 105  105  105   CO2 22 - 32 mmol/L 24  26  23    Calcium 8.9 - 10.3 mg/dL 8.5  8.4  8.4     Micro BC - MSSA  Impression/recommendation Staph aureus bacteremia Discitis and osteomyelitis T8-T10 On cefazolin for 6 weeks and will likely need PO following that PICC line in place Granddaughter has been taught to administer antibiotics at home Weekly labs I will follow her as OP  Multiple myeloma Followed by oncology  Discussed the management in great detail with patient and granddaughter

## 2022-08-12 NOTE — Progress Notes (Signed)
[name] to be D/C'd Home per MD order.  Discussed prescriptions and follow up appointments with the patient. Prescriptions given to patient, medication list explained in detail. Pt verbalized understanding.  Allergies as of 08/12/2022       Reactions   Isoniazid Other (See Comments)   Blisters         Medication List     STOP taking these medications    furosemide 40 MG tablet Commonly known as: LASIX   lenalidomide 10 MG capsule Commonly known as: REVLIMID   oxyCODONE-acetaminophen 5-325 MG tablet Commonly known as: PERCOCET/ROXICET       TAKE these medications    allopurinol 300 MG tablet Commonly known as: ZYLOPRIM Take 300 mg by mouth daily.   ALPRAZolam 0.25 MG tablet Commonly known as: XANAX TAKE 1 TABLET(0.25 MG) BY MOUTH AT BEDTIME AS NEEDED FOR ANXIETY What changed: See the new instructions.   atenolol 50 MG tablet Commonly known as: TENORMIN Take 1 tablet (50 mg total) by mouth daily. Start taking on: August 13, 2022   CALCIUM MAGNESIUM ZINC PO Take 1 tablet by mouth 2 (two) times daily.   ceFAZolin  IVPB Commonly known as: ANCEF Inject 2 g into the vein every 8 (eight) hours. Indication:  MSSA bacteremia and Thoracic diskitis and osteomyelitis First Dose: No Last Day of Therapy:  09/17/2022 Labs - Once weekly:  CBC/D and CMP, ESR, CRP Method of administration: IV Push Method of administration may be changed at the discretion of home infusion pharmacist based upon assessment of the patient and/or caregiver's ability to self-administer the medication ordered.  Please pull PIC at completion of IV antibiotics  Fax weekly lab results  promptly to [phone]   cyanocobalamin 1000 MCG tablet Commonly known as: VITAMIN B12 Take 1,000 mcg by mouth daily.   docusate sodium 100 MG capsule Commonly known as: COLACE Take 1 capsule (100 mg total) by mouth 2 (two) times daily.   DULoxetine 20 MG capsule Commonly known as: CYMBALTA Take 20 mg  by mouth 2 (two) times daily.   Eliquis 5 MG Tabs tablet Generic drug: apixaban TAKE 1 TABLET(5 MG) BY MOUTH TWICE DAILY What changed: See the new instructions.   HYDROcodone-acetaminophen 5-325 MG tablet Commonly known as: NORCO/VICODIN Take 1 tablet by mouth every 4 (four) hours as needed for moderate pain or severe pain.   hydroxychloroquine 200 MG tablet Commonly known as: PLAQUENIL Take 200 mg by mouth See admin instructions. Patient currently takes 200 mg twice daily Monday through Friday and 200 mg once daily Saturday and Sunday.   Ipratropium-Albuterol 20-100 MCG/ACT Aers respimat Commonly known as: COMBIVENT Inhale 1 puff into the lungs every 6 (six) hours as needed for wheezing.   iron polysaccharides 150 MG capsule Commonly known as: NIFEREX Take 1 capsule (150 mg total) by mouth daily. Start taking on: August 13, 2022   lansoprazole 30 MG capsule Commonly known as: PREVACID Take 1 capsule (30 mg total) by mouth 2 (two) times daily before a meal.   levothyroxine 112 MCG tablet Commonly known as: SYNTHROID Take 112 mcg by mouth daily before breakfast.   Myrbetriq 50 MG Tb24 tablet Generic drug: mirabegron ER Take 50 mg by mouth daily.   OLANZapine 10 MG tablet Commonly known as: ZYPREXA Take 1 tablet (10 mg total) by mouth at bedtime.   oxybutynin 10 MG 24 hr tablet Commonly known as: DITROPAN-XL Take 1 tablet (10 mg total) by mouth daily.   Potassium 99 MG Tabs Take  1 tablet by mouth daily.   rOPINIRole 0.5 MG tablet Commonly known as: REQUIP Take 0.5 mg by mouth at bedtime.   senna-docusate 8.6-50 MG tablet Commonly known as: Senokot-S Take 1 tablet by mouth 2 (two) times daily. Hold if having loose or frequent stools.   Spacer/Aero-Holding Dorise Bullion 1 each by Does not apply route as needed.   tiZANidine 2 MG tablet Commonly known as: ZANAFLEX Take 1 tablet (2 mg total) by mouth 2 (two) times daily as needed for muscle spasms. What changed:   when to take this reasons to take this   vitamin C 1000 MG tablet Take 1,000 mg by mouth daily.   Vitamin D-3 125 MCG (5000 UT) Tabs Take 5,000 Units by mouth daily.   zinc gluconate 50 MG tablet Take 50 mg by mouth daily.               Discharge Care Instructions  (From admission, onward)           Start     Ordered   08/12/22 0000  Change dressing on IV access line weekly and PRN  (Home infusion instructions - [facility] )        08/12/22 0830            Vitals:   08/12/22 0600 08/12/22 0845  BP: (!) 161/86 (!) 160/83  Pulse: 95 95  Resp: 15 18  Temp: 98.3 F (36.8 C) 98.2 F (36.8 C)  SpO2: 93% 96%    Skin clean, dry and intact without evidence of skin break down, no evidence of skin tears noted. IV catheter discontinued intact. Site without signs and symptoms of complications. Dressing and pressure applied. Pt denies pain at this time. No complaints noted.  An After Visit Summary was printed and given to the patient. Patient escorted via [place], and D/C home via private auto.  Sydney Gillespie

## 2022-08-12 NOTE — TOC Transition Note (Signed)
Transition of Care [facility]) - CM/SW Discharge Note   Patient Details  Name: [name] MRN: [id number] Date of Birth: [birth year]  [facility] CM/SW Contact:  Beverly Sessions, RN Phone Number: 08/12/2022, 2:24 PM   Clinical Narrative:     Patient to discharge today Per Pam with [employer] teaching was completed with patient and granddaughter.  Granddaughter to administer 10pm at home tonight.  Corene Cornea with [facility] notified of discharge   Final next level of care: Home/Self Care Barriers to Discharge: Continued Medical Work up   Patient Goals and CMS Choice      Discharge Placement                         Discharge Plan and Services Additional resources added to the After Visit Summary for       Post Acute Care Choice: NA                               Social Determinants of Health (SDOH) Interventions SDOH Screenings   Food Insecurity: No Food Insecurity (08/05/2022)  Housing: [place]  (08/05/2022)  Transportation Needs: No Transportation Needs (08/05/2022)  Utilities: Not At Risk (08/05/2022)  Tobacco Use: Low Risk  (08/05/2022)     Readmission Risk Interventions    08/06/2022    3:47 PM 05/10/2022    2:56 PM  Readmission Risk Prevention Plan  Transportation Screening Complete Complete  Medication Review ([place]) Complete Complete  PCP or Specialist appointment within 3-5 days of discharge Complete Complete  HRI or [place]  Complete  SW Recovery Care/Counseling Consult Complete Complete  Palliative Care Screening Not Applicable Not [place] Not Applicable Not Applicable

## 2022-08-12 NOTE — Discharge Summary (Addendum)
Physician Discharge Summary   Patient: Sydney Gillespie MRN: [id number] DOB: [birth year]  Admit date:     08/05/2022  Discharge date: 08/14/22  Discharge Physician: Pennie Banter   PCP: Barbette Reichmann, MD   Recommendations at discharge:   Follow up with Infectious Disease 09/17/22 at 10.30 AM  Follow up with Primary Care in 1-2 weeks Per ID orders, obtain weekly labs (CBC w diff, CMP, CRP, ESR) and fax results to [phone] Check Mg level with next labs -- PCP to follow up   Discharge Diagnoses: Principal Problem:   Pneumonia Active Problems:   CKD (chronic kidney disease), stage IV   Gout   Hypothyroidism   Iron deficiency anemia   Acute deep vein thrombosis (DVT) of calf muscle vein of right lower extremity   Thoracic compression fracture, with delayed healing, subsequent encounter   CAP (community acquired pneumonia)   MSSA bacteremia   Discitis of thoracic region   Vertebral osteomyelitis  Resolved Problems:   Acute respiratory failure with hypoxia  Hospital Course:  Sydney Gillespie is a 82 y.o. female with medical history significant of Barrett's esophagus, compression fracture status post kyphoplasty March 20, hypothyroidism, gout, CKD, DVT on Eliquis presenting with acute respiratory failure with hypoxia and aspiration pneumonia.     Patient noted to have been admitted recently for thoracic compression fractures with noted T8, T9 and T10 kyphoplasty and vertebral body biopsy by Dr. Lovell Sheehan on March 20.  Patient says overall stable pain since hospitalization.  Patient with significant increase shortness of breath over the past 12 to 24 hours PTA.   Some remote history of dysphagia in the past per the grand daughter. O2 sats were dropping in to the upper 80s to low 90s at home.    Assessment and Plan:  MSSA bacteremia  Thoracic diskitis and osteomyelitis  MRI on 07/01/22 had already shown "discitis and osteomyelitis at T8-9 and T9-10".    Biopsy taken  during kyphoplasty on 07/29/22 returned pos for acute and chronic osteomyelitis. --ID consulted --Echo no vegetation.  No need for TEE since need to give long-term abx anyways for osteo.   Repeat MRI showed "Persistent findings of T8-9-10 diskitis-osteomyelitis. Small amount of ventral epidural enhancing material." --repeat blood cx neg --PICC line placed --cont IV cefazolin for total of 6 weeks, End Date: 09/17/22. --ID Clinic Follow Up Appt: 09/17/22 at 10.30 AM    * Pneumonia, suspected aspiration New onset resp failure w/ hypoxia w/ noted LLL PNA concerning for possible aspiration event  Started on rocephin, azithromycin, flagyl in ER then switched to cefazolin, per ID --covid and RVP neg --SLP eval cleared for diet   Acute respiratory failure with hypoxia - resolved New O2 requirement in setting of LLL PNA on CT concerning for aspiration PNA.  No evidence of PE.  Respiratory distress may also be exacerbated by anxiety. --now on room air --Xanax PRN while inpatient   CKD 3b Hx of CKD 4, now improved to 3b   Gout --cont allopurinol   Hypothyroidism --cont Synthroid   Iron deficiency anemia --anemia workup showed iron def --transfused 1u pRBC (irradiated and leukocyte reduced) for Hgb 6.9 --did not give IV iron due to bacteremia --started oral iron supplement --Hbg stable and improved to 9.3   Thoracic compression fracture, with delayed healing, subsequent encounter S/p T8, T9 and T10 kyphoplasty and vertebral body biopsy 07/29/2022 by Dr. Lovell Sheehan w/ NSG  Appears stable, pain improved --Norco PRN   Deep vein thrombosis (DVT)  --[place]  found "Nonocclusive calf vein DVT involving the right posterior tibial vein", which is similar to finding back on 04/25/2022 --cont home Eliquis   Abdominal cramping --IV robaxin worked better. --changed zanaflex to robaxin BID --IV robaxin PRN       Consultants: ID, radiology Procedures performed: Echo   Disposition: Home health  Diet  recommendation:  Regular diet  DISCHARGE MEDICATION: Allergies as of 08/12/2022       Reactions   Isoniazid Other (See Comments)   Blisters         Medication List     STOP taking these medications    furosemide 40 MG tablet Commonly known as: LASIX   lenalidomide 10 MG capsule Commonly known as: REVLIMID   oxyCODONE-acetaminophen 5-325 MG tablet Commonly known as: PERCOCET/ROXICET       TAKE these medications    allopurinol 300 MG tablet Commonly known as: ZYLOPRIM Take 300 mg by mouth daily.   ALPRAZolam 0.25 MG tablet Commonly known as: XANAX TAKE 1 TABLET(0.25 MG) BY MOUTH AT BEDTIME AS NEEDED FOR ANXIETY What changed: See the new instructions.   atenolol 50 MG tablet Commonly known as: TENORMIN Take 1 tablet (50 mg total) by mouth daily.   CALCIUM MAGNESIUM ZINC PO Take 1 tablet by mouth 2 (two) times daily.   ceFAZolin  IVPB Commonly known as: ANCEF Inject 2 g into the vein every 8 (eight) hours. Indication:  MSSA bacteremia and Thoracic diskitis and osteomyelitis First Dose: No Last Day of Therapy:  09/17/2022 Labs - Once weekly:  CBC/D and CMP, ESR, CRP Method of administration: IV Push Method of administration may be changed at the discretion of home infusion pharmacist based upon assessment of the patient and/or caregiver's ability to self-administer the medication ordered.  Please pull PIC at completion of IV antibiotics  Fax weekly lab results  promptly to [phone]   cyanocobalamin 1000 MCG tablet Commonly known as: VITAMIN B12 Take 1,000 mcg by mouth daily.   docusate sodium 100 MG capsule Commonly known as: COLACE Take 1 capsule (100 mg total) by mouth 2 (two) times daily.   DULoxetine 20 MG capsule Commonly known as: CYMBALTA Take 20 mg by mouth 2 (two) times daily.   Eliquis 5 MG Tabs tablet Generic drug: apixaban TAKE 1 TABLET(5 MG) BY MOUTH TWICE DAILY What changed: See the new instructions.   HYDROcodone-acetaminophen  5-325 MG tablet Commonly known as: NORCO/VICODIN Take 1 tablet by mouth every 4 (four) hours as needed for moderate pain or severe pain.   hydroxychloroquine 200 MG tablet Commonly known as: PLAQUENIL Take 200 mg by mouth See admin instructions. Patient currently takes 200 mg twice daily Monday through Friday and 200 mg once daily Saturday and Sunday.   Ipratropium-Albuterol 20-100 MCG/ACT Aers respimat Commonly known as: COMBIVENT Inhale 1 puff into the lungs every 6 (six) hours as needed for wheezing.   iron polysaccharides 150 MG capsule Commonly known as: NIFEREX Take 1 capsule (150 mg total) by mouth daily.   lansoprazole 30 MG capsule Commonly known as: PREVACID Take 1 capsule (30 mg total) by mouth 2 (two) times daily before a meal.   levothyroxine 112 MCG tablet Commonly known as: SYNTHROID Take 112 mcg by mouth daily before breakfast.   Myrbetriq 50 MG Tb24 tablet Generic drug: mirabegron ER Take 50 mg by mouth daily.   OLANZapine 10 MG tablet Commonly known as: ZYPREXA Take 1 tablet (10 mg total) by mouth at bedtime.   oxybutynin 10 MG 24 hr tablet  Commonly known as: DITROPAN-XL Take 1 tablet (10 mg total) by mouth daily.   Potassium 99 MG Tabs Take 1 tablet by mouth daily.   rOPINIRole 0.5 MG tablet Commonly known as: REQUIP Take 0.5 mg by mouth at bedtime.   senna-docusate 8.6-50 MG tablet Commonly known as: Senokot-S Take 1 tablet by mouth 2 (two) times daily. Hold if having loose or frequent stools.   Spacer/Aero-Holding Rudean Curt 1 each by Does not apply route as needed.   tiZANidine 2 MG tablet Commonly known as: ZANAFLEX Take 1 tablet (2 mg total) by mouth 2 (two) times daily as needed for muscle spasms. What changed:  when to take this reasons to take this   vitamin C 1000 MG tablet Take 1,000 mg by mouth daily.   Vitamin D-3 125 MCG (5000 UT) Tabs Take 5,000 Units by mouth daily.   zinc gluconate 50 MG tablet Take 50 mg by mouth  daily.               Discharge Care Instructions  (From admission, onward)           Start     Ordered   08/12/22 0000  Change dressing on IV access line weekly and PRN  (Home infusion instructions - [facility] )        08/12/22 0830            Discharge Exam: Filed Weights   08/05/22 0805  Weight: 59.9 kg   General exam: awake, alert, no acute distress HEENT: moist mucus membranes, hearing grossly normal  Respiratory system: CTAB, no wheezes, rales or rhonchi, normal respiratory effort. Cardiovascular system: normal S1/S2, RRR, no pedal edema.   Gastrointestinal system: soft, NT, ND, no HSM felt, +bowel sounds. Central nervous system: A&O x4. no gross focal neurologic deficits, normal speech Extremities: moves all , no edema, normal tone Skin: dry, intact, normal temperature Psychiatry: normal mood, congruent affect, judgement and insight appear normal   Condition at discharge: stable  The results of significant diagnostics from this hospitalization (including imaging, microbiology, ancillary and laboratory) are listed below for reference.   Imaging Studies: [place] EKG SITE RITE  Result Date: 08/11/2022 If Site Rite image not attached, placement could not be confirmed due to current cardiac rhythm.  ECHOCARDIOGRAM COMPLETE  Result Date: 08/09/2022    ECHOCARDIOGRAM REPORT   Patient Name:   ALLANAH WOOLBRIGHT Date of Exam: 08/09/2022 Medical Rec #:  [id number]          Height:       65.0 in Accession #:    [id number]         Weight:       132.0 lb Date of Birth:  Nov 24, 1940           BSA:          1.658 m Patient Age:    82 years           BP:           167/91 mmHg Patient Gender: F                  HR:           102 bpm. Exam Location:  [facility] Procedure: 2D Echo Indications:     Bacteremia R78.81  History:         Patient has prior history of Echocardiogram examinations, most  recent 05/07/2022.  Sonographer:     Overton Mam RDCS Referring  Phys:  [id number] Inetta Fermo LAI Diagnosing Phys: Dina Rich MD  Sonographer Comments: Suboptimal subcostal window. Image acquisition challenging due to respiratory motion. IMPRESSIONS  1. Left ventricular ejection fraction, by estimation, is 35 to 40%. The left ventricle has moderately decreased function. The left ventricle demonstrates global hypokinesis. There is mild left ventricular hypertrophy. Left ventricular diastolic parameters are indeterminate. Elevated left atrial pressure.  2. Right ventricular systolic function is normal. The right ventricular size is normal.  3. Left atrial size was severely dilated.  4. The mitral valve is abnormal. Moderate mitral valve regurgitation. No evidence of mitral stenosis.  5. The tricuspid valve is abnormal.  6. The aortic valve is tricuspid. There is mild calcification of the aortic valve. There is mild thickening of the aortic valve. Aortic valve regurgitation is moderate. No aortic stenosis is present. FINDINGS  Left Ventricle: Left ventricular ejection fraction, by estimation, is 35 to 40%. The left ventricle has moderately decreased function. The left ventricle demonstrates global hypokinesis. The left ventricular internal cavity size was normal in size. There is mild left ventricular hypertrophy. Left ventricular diastolic parameters are indeterminate. Elevated left atrial pressure. Right Ventricle: Unable to estimated PASP, IVC poorly visualized. The right ventricular size is normal. Right vetricular wall thickness was not well visualized. Right ventricular systolic function is normal. Left Atrium: Left atrial size was severely dilated. Right Atrium: Right atrial size was normal in size. Pericardium: The pericardium was not well visualized. Mitral Valve: The mitral valve is abnormal. Mild mitral annular calcification. Moderate mitral valve regurgitation. No evidence of mitral valve stenosis. MV peak gradient, 10.2 mmHg. The mean mitral valve gradient is 5.0 mmHg.  Tricuspid Valve: The tricuspid valve is abnormal. Tricuspid valve regurgitation is mild . No evidence of tricuspid stenosis. Aortic Valve: The aortic valve is tricuspid. There is mild calcification of the aortic valve. There is mild thickening of the aortic valve. There is mild aortic valve annular calcification. Aortic valve regurgitation is moderate. Aortic regurgitation PHT  measures 297 msec. No aortic stenosis is present. Aortic valve mean gradient measures 6.3 mmHg. Aortic valve peak gradient measures 12.1 mmHg. Aortic valve area, by VTI measures 1.35 cm. Pulmonic Valve: The pulmonic valve was not well visualized. Pulmonic valve regurgitation is not visualized. No evidence of pulmonic stenosis. Aorta: The aortic root is normal in size and structure. Venous: The inferior vena cava was not well visualized. IAS/Shunts: The interatrial septum was not well visualized.  LEFT VENTRICLE PLAX 2D LVIDd:         3.60 cm      Diastology LVIDs:         3.00 cm      LV e' medial:    5.55 cm/s LV PW:         1.10 cm      LV E/e' medial:  20.0 LV IVS:        1.10 cm      LV e' lateral:   6.31 cm/s LVOT diam:     1.90 cm      LV E/e' lateral: 17.6 LV SV:         43 LV SV Index:   26 LVOT Area:     2.84 cm  LV Volumes (MOD) LV vol d, MOD A2C: 66.3 ml LV vol d, MOD A4C: 104.0 ml LV vol s, MOD A2C: 34.4 ml LV vol s, MOD A4C: 61.7 ml LV SV MOD A2C:  31.9 ml LV SV MOD A4C:     104.0 ml LV SV MOD BP:      34.2 ml RIGHT VENTRICLE RV Basal diam:  2.00 cm RV S prime:     18.40 cm/s TAPSE (M-mode): 1.6 cm LEFT ATRIUM             Index        RIGHT ATRIUM          Index LA diam:        5.40 cm 3.26 cm/m   RA Area:     8.97 cm LA Vol (A2C):   92.6 ml 55.85 ml/m  RA Volume:   14.70 ml 8.87 ml/m LA Vol (A4C):   89.6 ml 54.04 ml/m LA Biplane Vol: 95.0 ml 57.30 ml/m  AORTIC VALVE                     PULMONIC VALVE AV Area (Vmax):    1.38 cm      PV Vmax:        1.03 m/s AV Area (Vmean):   1.34 cm      PV Peak grad:   4.2 mmHg AV  Area (VTI):     1.35 cm      RVOT Peak grad: 4 mmHg AV Vmax:           173.84 cm/s AV Vmean:          120.488 cm/s AV VTI:            0.321 m AV Peak Grad:      12.1 mmHg AV Mean Grad:      6.3 mmHg LVOT Vmax:         84.90 cm/s LVOT Vmean:        56.900 cm/s LVOT VTI:          0.153 m LVOT/AV VTI ratio: 0.48 AI PHT:            297 msec  AORTA Ao Root diam: 3.20 cm Ao Asc diam:  2.90 cm MITRAL VALVE                TRICUSPID VALVE MV Area (PHT): 7.59 cm     TR Peak grad:   34.1 mmHg MV Area VTI:   1.37 cm     TR Vmax:        292.00 cm/s MV Peak grad:  10.2 mmHg MV Mean grad:  5.0 mmHg     SHUNTS MV Vmax:       1.60 m/s     Systemic VTI:  0.15 m MV Vmean:      103.0 cm/s   Systemic Diam: 1.90 cm MV Decel Time: 100 msec MR Peak grad: 156.8 mmHg MR Mean grad: 105.0 mmHg MR Vmax:      626.00 cm/s MR Vmean:     483.0 cm/s MV E velocity: 111.00 cm/s MV A velocity: 142.00 cm/s MV E/A ratio:  0.78 Dina Rich MD Electronically signed by Dina Rich MD Signature Date/Time: 08/09/2022/10:45:10 AM    Final    MR THORACIC SPINE W WO CONTRAST  Result Date: 08/07/2022 CLINICAL DATA:  Staphylococcal bacteremia with recent spine procedure. EXAM: MRI THORACIC WITHOUT AND WITH CONTRAST TECHNIQUE: Multiplanar and multiecho pulse sequences of the thoracic spine were obtained without and with intravenous contrast. CONTRAST:  6mL GADAVIST GADOBUTROL 1 MMOL/ML IV SOLN COMPARISON:  None Available. FINDINGS: Alignment: There is increased kyphosis at the midthoracic spine, centered at T9. Vertebrae: Status post  augmentation at T8, T9 and T10. Height loss of approximately 75% at T9 is unchanged. No progressive height loss at T8 or T10. However, abnormal marrow signal at T8 and T10 has worsened. There is persistent abnormal signal within the disc space. Cord: Normal signal and morphology. There is a thin crescent of epidural contrast enhancement along the ventral margin of the spinal canal, which may be prominent venous plexus.  Paraspinal and other soft tissues: Negative Disc levels: There is moderate spinal canal stenosis at the T9 level. IMPRESSION: 1. Status post augmentation at T8, T9 and T10 without progression of height loss. 2. 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 Small amount of ventral epidural enhancing material may be dilated venous plexus or small epidural abscess/phlegmon. 3. Moderate T9 spinal canal stenosis. Electronically Signed   By: Deatra Robinson M.D.   On: 08/07/2022 22:34   DG Chest Port 1 View  Result Date: 08/07/2022 CLINICAL DATA:  Status post right thoracentesis EXAM: PORTABLE CHEST 1 VIEW COMPARISON:  Chest radiograph dated 08/05/2022 FINDINGS: Low lung volumes. Mild right interstitial opacities. Bibasilar patchy opacities. Decreased trace right pleural effusion. Pneumothorax. Similar cardiomediastinal silhouette. Cervical spinal fixation hardware appears intact. Multilevel vertebral augmentation. Unchanged right lateral rib fractures. Right upper quadrant surgical clips. IMPRESSION: 1. Decreased trace right pleural effusion. No pneumothorax. 2. Low lung volumes with mild right interstitial opacities and bibasilar patchy opacities. Electronically Signed   By: Agustin Cree M.D.   On: 08/07/2022 16:26   US THORACENTESIS ASP PLEURAL SPACE W/IMG GUIDE  Result Date: 08/07/2022 INDICATION: History of stage IV CKD admitted for acute respiratory failure with hypoxia and aspiration pneumonia. Patient found to have moderate right small left pleural effusion. Request received for diagnostic and therapeutic right thoracentesis. EXAM: ULTRASOUND GUIDED DIAGNOSTIC AND THERAPEUTIC RIGHT THORACENTESIS MEDICATIONS: 10 mL 1 % lidocaine COMPLICATIONS: None immediate. PROCEDURE: An ultrasound guided thoracentesis was thoroughly discussed with the patient and questions answered. The benefits, risks, alternatives and complications were also discussed. The patient understands and wishes to proceed with the  procedure. Written consent was obtained. Ultrasound was performed to localize and mark an adequate pocket of fluid in the right chest. The area was then prepped and draped in the normal sterile fashion. 1% Lidocaine was used for local anesthesia. Under ultrasound guidance a 6 Fr Safe-T-Centesis catheter was introduced. Thoracentesis was performed. The catheter was removed and a dressing applied. FINDINGS: A total of approximately 700 cc of hazy, amber fluid was removed. Samples were sent to the laboratory as requested by the clinical team. IMPRESSION: Successful ultrasound guided right thoracentesis yielding 700 cc of pleural fluid. Read by: Alex Gardener, AGNP-BC Electronically Signed   By: Olive Bass M.D.   On: 08/07/2022 16:20   US Venous Img Lower Bilateral (DVT)  Result Date: 08/05/2022 CLINICAL DATA:  Lower extremity weakness EXAM: BILATERAL LOWER EXTREMITY VENOUS DOPPLER ULTRASOUND TECHNIQUE: Gray-scale sonography with compression, as well as color and duplex ultrasound, were performed to evaluate the deep venous system(s) from the level of the common femoral vein through the popliteal and proximal calf veins. COMPARISON:  None Available. FINDINGS: VENOUS Nonocclusive thrombus in the right posterior tibial vein. Remaining venous structures of both lower extremities demonstrate no evidence of DVT. OTHER None. Limitations: none IMPRESSION: 1. Nonocclusive calf vein DVT involving the right posterior tibial vein. No other deep vein thrombosis is demonstrated in either lower extremity. These results will be called to the ordering clinician or representative by the Radiologist Assistant, and communication documented in the PACS or [facility]. Electronically Signed   By: Gaylyn Rong M.D.   On: 08/05/2022 15:28   CT Angio Chest PE W/Cm &/Or Wo Cm  Result Date: 08/05/2022 CLINICAL DATA:  Pulmonary embolism (PE) suspected, high prob. Shortness of breath. EXAM: CT ANGIOGRAPHY CHEST WITH CONTRAST  TECHNIQUE: Multidetector CT imaging of the chest was performed using the standard protocol during bolus administration of intravenous contrast. Multiplanar CT image reconstructions and MIPs were obtained to evaluate the vascular anatomy. RADIATION DOSE REDUCTION: This exam was performed according to the departmental dose-optimization program which includes automated exposure control, adjustment of the mA and/or kV according to patient size and/or use of iterative reconstruction technique. CONTRAST:  75mL OMNIPAQUE IOHEXOL 350 MG/ML SOLN COMPARISON:  CTA chest 05/06/2022. FINDINGS: Cardiovascular: Satisfactory opacification of the pulmonary arteries to the segmental level. No evidence of pulmonary embolism. Streak artifact from vertebral augmentation cement in the lower lobes. Normal heart size. No pericardial effusion. Atherosclerotic calcifications of the aorta and coronary arteries. Mediastinum/Nodes: No enlarged mediastinal, hilar, or axillary lymph nodes. Thyroid gland, trachea, and esophagus demonstrate no significant findings. Lungs/Pleura: Moderate right and small left pleural effusions with adjacent atelectasis in the lower lobes. Ground-glass and tree-in-bud opacities in the left lower lobe, suspicious for aspiration or infection. No consolidation. No pneumothorax. Upper Abdomen: No acute abnormality. Musculoskeletal: Interval vertebral augmentation at T8, T9 and T10. Unchanged degenerative Schmorl's node in the T7 superior endplate. Unchanged mild compression deformity at T12. Partially visualized changes from prior ACDF. Review of the MIP images confirms the above findings. IMPRESSION: 1. No evidence of pulmonary embolism. 2. Moderate right and small left pleural effusions with adjacent atelectasis in the lower lobes. 3. Ground-glass and tree-in-bud opacities in the left lower lobe, suspicious for aspiration or infection. 4. Aortic Atherosclerosis (ICD10-I70.0).  Coronary atherosclerosis. Electronically  Signed   By: Orvan Falconer M.D.   On: 08/05/2022 09:51   DG Chest 2 View  Result Date: 08/05/2022 CLINICAL DATA:  Shortness of breath EXAM: CHEST - 2 VIEW COMPARISON:  Chest x-ray dated May 09, 2022 FINDINGS: Cardiac and mediastinal contours are within normal limits. Elevation of the right hemidiaphragm. Right-greater-than-left basilar opacities, increased when compared with the prior no large pleural effusion or evidence of pneumothorax. IMPRESSION: Right-greater-than-left basilar opacities, increased when compared with the prior, concerning for infection or aspiration. Electronically Signed   By: Allegra Lai M.D.   On: 08/05/2022 08:36   DG Thoracic Spine 2 View  Result Date: 07/29/2022 CLINICAL DATA:  Fluoroscopic assistance for kyphoplasty EXAM: THORACIC SPINE 2 VIEWS COMPARISON:  06/16/2022 FINDINGS: Fluoroscopic images show evidence of vertebroplasty in multiple lower thoracic vertebral bodies. Fluoroscopy time was 2 minutes and 40 seconds. Radiation dose 64.45 mGy. IMPRESSION: Fluoroscopic assistance was provided for kyphoplasty in thoracic spine. Electronically Signed   By: Ernie Avena M.D.   On: 07/29/2022 20:55   DG C-Arm 1-60 Min-No Report  Result Date: 07/29/2022 Fluoroscopy was utilized by the requesting physician.  No radiographic interpretation.   DG C-Arm 1-60 Min-No Report  Result Date: 07/29/2022 Fluoroscopy was utilized by the requesting physician.  No radiographic interpretation.    Microbiology: Results for orders placed or performed during the hospital encounter of 08/05/22  Culture, blood (routine x 2)     Status: Abnormal   Collection Time: 08/05/22 11:00 AM   Specimen: BLOOD  Result Value Ref Range Status   Specimen Description   Final    BLOOD  LEFT FORE ARM  Performed at [facility], [address]  [place], [place] [address]    Special Requests   Final    NONE Performed at [facility], [address]  [place], [place] [address]    Culture  Setup Time   Final    GRAM POSITIVE COCCI IN BOTH AEROBIC AND ANAEROBIC BOTTLES CRITICAL RESULT CALLED TO, READ BACK BY AND VERIFIED WITH: [email or web address]  08/06/22 RH Performed at [facility], [address], [place], [place] [address]    Culture STAPHYLOCOCCUS AUREUS (A)  Final   Report Status 08/09/2022 FINAL  Final   Organism ID, Bacteria STAPHYLOCOCCUS AUREUS  Final      Susceptibility   Staphylococcus aureus - MIC*    CIPROFLOXACIN <=0.5 SENSITIVE Sensitive     ERYTHROMYCIN <=0.25 SENSITIVE Sensitive     GENTAMICIN <=0.5 SENSITIVE Sensitive     OXACILLIN 0.5 SENSITIVE Sensitive     TETRACYCLINE <=1 SENSITIVE Sensitive     VANCOMYCIN <=0.5 SENSITIVE Sensitive     TRIMETH/SULFA <=10 SENSITIVE Sensitive     CLINDAMYCIN <=0.25 SENSITIVE Sensitive     RIFAMPIN <=0.5 SENSITIVE Sensitive     Inducible Clindamycin NEGATIVE Sensitive     * STAPHYLOCOCCUS AUREUS  Blood Culture ID Panel (Reflexed)     Status: Abnormal   Collection Time: 08/05/22 11:00 AM  Result Value Ref Range Status   Enterococcus faecalis NOT DETECTED NOT DETECTED Final   Enterococcus Faecium NOT DETECTED NOT DETECTED Final   Listeria monocytogenes NOT DETECTED NOT DETECTED Final   Staphylococcus species DETECTED (A) NOT DETECTED Final    Comment: CRITICAL RESULT CALLED TO, READ BACK BY AND VERIFIED WITH: JASON ROBBINS@[id number]  08/06/22 RH    Staphylococcus aureus (BCID) DETECTED (A) NOT DETECTED Final    Comment: CRITICAL RESULT CALLED TO, READ BACK BY AND VERIFIED WITH: JASON ROBBINS@[id number]  08/06/22 RH    Staphylococcus epidermidis NOT DETECTED NOT DETECTED Final   Staphylococcus lugdunensis NOT DETECTED NOT DETECTED Final   Streptococcus species NOT DETECTED NOT DETECTED Final   Streptococcus agalactiae NOT DETECTED NOT DETECTED Final   Streptococcus pneumoniae NOT DETECTED NOT DETECTED Final   Streptococcus pyogenes NOT DETECTED NOT DETECTED Final    A.calcoaceticus-baumannii NOT DETECTED NOT DETECTED Final   Bacteroides fragilis NOT DETECTED NOT DETECTED Final   Enterobacterales NOT DETECTED NOT DETECTED Final   Enterobacter cloacae complex NOT DETECTED NOT DETECTED Final   Escherichia coli NOT DETECTED NOT DETECTED Final   Klebsiella aerogenes NOT DETECTED NOT DETECTED Final   Klebsiella oxytoca NOT DETECTED NOT DETECTED Final   Klebsiella pneumoniae NOT DETECTED NOT DETECTED Final   Proteus species NOT DETECTED NOT DETECTED Final   Salmonella species NOT DETECTED NOT DETECTED Final   Serratia marcescens NOT DETECTED NOT DETECTED Final   Haemophilus influenzae NOT DETECTED NOT DETECTED Final   Neisseria meningitidis NOT DETECTED NOT DETECTED Final   Pseudomonas aeruginosa NOT DETECTED NOT DETECTED Final   Stenotrophomonas maltophilia NOT DETECTED NOT DETECTED Final   Candida albicans NOT DETECTED NOT DETECTED Final   Candida auris NOT DETECTED NOT DETECTED Final   Candida glabrata NOT DETECTED NOT DETECTED Final   Candida krusei NOT DETECTED NOT DETECTED Final   Candida parapsilosis NOT DETECTED NOT DETECTED Final   Candida tropicalis NOT DETECTED NOT DETECTED Final   Cryptococcus neoformans/gattii NOT DETECTED NOT DETECTED Final   Meth resistant mecA/C and MREJ NOT DETECTED NOT DETECTED Final    Comment: Performed at [facility], [address] [place], [place] [address]  Culture, blood (routine x 2)  Status: Abnormal   Collection Time: 08/05/22 11:18 AM   Specimen: BLOOD RIGHT FOREARM  Result Value Ref Range Status   Specimen Description BLOOD RIGHT FOREARM  Final   Special Requests   Final    BOTTLES DRAWN AEROBIC AND ANAEROBIC Blood Culture adequate volume   Culture  Setup Time   Final    GRAM POSITIVE COCCI IN BOTH AEROBIC AND ANAEROBIC BOTTLES CRITICAL VALUE NOTED.  VALUE IS CONSISTENT WITH PREVIOUSLY REPORTED AND CALLED VALUE.    Culture (A)  Final    STAPHYLOCOCCUS AUREUS SUSCEPTIBILITIES PERFORMED ON  PREVIOUS CULTURE WITHIN THE LAST 5 DAYS.    Report Status 08/09/2022 FINAL  Final  Respiratory (~20 pathogens) panel by PCR     Status: None   Collection Time: 08/05/22 11:14 PM   Specimen: Nasopharyngeal Swab; Respiratory  Result Value Ref Range Status   Adenovirus NOT DETECTED NOT DETECTED Final   Coronavirus 229E NOT DETECTED NOT DETECTED Final    Comment: (NOTE) The Coronavirus on the Respiratory Panel, DOES NOT test for the novel  Coronavirus (2019 nCoV)    Coronavirus HKU1 NOT DETECTED NOT DETECTED Final   Coronavirus NL63 NOT DETECTED NOT DETECTED Final   Coronavirus OC43 NOT DETECTED NOT DETECTED Final   Metapneumovirus NOT DETECTED NOT DETECTED Final   Rhinovirus / Enterovirus NOT DETECTED NOT DETECTED Final   Influenza A NOT DETECTED NOT DETECTED Final   Influenza B NOT DETECTED NOT DETECTED Final   Parainfluenza Virus 1 NOT DETECTED NOT DETECTED Final   Parainfluenza Virus 2 NOT DETECTED NOT DETECTED Final   Parainfluenza Virus 3 NOT DETECTED NOT DETECTED Final   Parainfluenza Virus 4 NOT DETECTED NOT DETECTED Final   Respiratory Syncytial Virus NOT DETECTED NOT DETECTED Final   Bordetella pertussis NOT DETECTED NOT DETECTED Final   Bordetella Parapertussis NOT DETECTED NOT DETECTED Final   Chlamydophila pneumoniae NOT DETECTED NOT DETECTED Final   Mycoplasma pneumoniae NOT DETECTED NOT DETECTED Final    Comment: Performed at [facility], [address] [place], [place] [address]  SARS Coronavirus 2 by RT PCR (hospital order, performed in [facility] hospital lab) *cepheid single result test* Anterior Nasal Swab     Status: None   Collection Time: 08/06/22 11:51 AM   Specimen: Anterior Nasal Swab  Result Value Ref Range Status   SARS Coronavirus 2 by RT PCR NEGATIVE NEGATIVE Final    Comment: (NOTE) SARS-CoV-2 target nucleic acids are NOT DETECTED.  The SARS-CoV-2 RNA is generally detectable in upper and lower respiratory specimens during the acute phase of  infection. The lowest concentration of SARS-CoV-2 viral copies this assay can detect is 250 copies / mL. A negative result does not preclude SARS-CoV-2 infection and should not be used as the sole basis for treatment or other patient management decisions.  A negative result may occur with improper specimen collection / handling, submission of specimen other than nasopharyngeal swab, presence of viral mutation(s) within the areas targeted by this assay, and inadequate number of viral copies (<250 copies / mL). A negative result must be combined with clinical observations, patient history, and epidemiological information.  Fact Sheet for Patients:   RoadLapTop.co.za  Fact Sheet for Healthcare Providers: http://kim-miller.com/  This test is not yet approved or  cleared by the [place] FDA and has been authorized for detection and/or diagnosis of SARS-CoV-2 by FDA under an Emergency Use Authorization (EUA).  This EUA will remain in effect (meaning this test can be used) for the duration  of the COVID-19 declaration under Section 564(b)(1) of the Act, 21 U.S.C. section 360bbb-3(b)(1), unless the authorization is terminated or revoked sooner.  Performed at [facility], [address] [place], [place] [address]   Culture, blood (Routine X 2) w Reflex to ID Panel     Status: None   Collection Time: 08/07/22  3:19 PM   Specimen: BLOOD  Result Value Ref Range Status   Specimen Description BLOOD BLOOD RIGHT ARM  Final   Special Requests   Final    BOTTLES DRAWN AEROBIC AND ANAEROBIC Blood Culture adequate volume   Culture   Final    NO GROWTH 5 DAYS Performed at [facility], [address] [place], [place] [address]    Report Status 08/12/2022 FINAL  Final  Body fluid culture w Gram Stain     Status: None   Collection Time: 08/07/22  4:00 PM   Specimen: PATH Cytology Pleural fluid  Result Value Ref Range Status    Specimen Description   Final    PLEURAL Performed at [facility], [address], [place], [place] [address]    Special Requests   Final    NONE Performed at [facility], [address] [place], [place] [address]    Gram Stain NO ORGANISMS SEEN NO WBC SEEN   Final   Culture   Final    NO GROWTH 3 DAYS Performed at [facility], [address] [place], [place] [address]    Report Status 08/11/2022 FINAL  Final  Culture, blood (Routine X 2) w Reflex to ID Panel     Status: None   Collection Time: 08/07/22  4:43 PM   Specimen: BLOOD  Result Value Ref Range Status   Specimen Description BLOOD BLOOD RIGHT WRIST  Final   Special Requests   Final    BOTTLES DRAWN AEROBIC AND ANAEROBIC Blood Culture results may not be optimal due to an excessive volume of blood received in culture bottles   Culture   Final    NO GROWTH 5 DAYS Performed at [facility], [address] [place], [place] [address]    Report Status 08/12/2022 FINAL  Final    Labs: CBC: Recent Labs  Lab 08/08/22 0251 08/09/22 0307 08/10/22 0528 08/12/22 0459  WBC 10.8* 12.1* 8.3 10.8*  HGB 6.9* 8.9* 8.2* 9.3*  HCT 22.8* 28.4* 26.7* 29.3*  MCV 98.7 96.9 97.8 95.4  PLT 292 294 265 291   Basic Metabolic Panel: Recent Labs  Lab 08/08/22 0251 08/09/22 0307 08/10/22 0528 08/12/22 0459  NA 135 136 137 136  K 4.6 4.0 4.1 3.8  CL 101 105 105 105  CO2 22 23 26 24   GLUCOSE 140* 93 97 112*  BUN 30* 27* 22 17  CREATININE 1.14* 1.16* 1.02* 1.04*  CALCIUM 8.0* 8.4* 8.4* 8.5*  MG 1.9 1.8 1.8 1.5*   Liver Function Tests: No results for input(s): "AST", "ALT", "ALKPHOS", "BILITOT", "PROT", "ALBUMIN" in the last 168 hours.  CBG: No results for input(s): "GLUCAP" in the last 168 hours.  Discharge time spent: greater than 30 minutes.  Signed: Pennie Banter, DO [facility] 08/14/2022

## 2022-08-14 ENCOUNTER — Encounter: Payer: Self-pay | Admitting: Hospitalist

## 2022-08-17 ENCOUNTER — Ambulatory Visit
Admission: RE | Admit: 2022-08-17 | Discharge: 2022-08-17 | Disposition: A | Discharge status: Home / Self Care | Payer: Medicare Other | Source: Ambulatory Visit | Attending: Internal Medicine | Admitting: Internal Medicine

## 2022-08-17 ENCOUNTER — Inpatient Hospital Stay (HOSPITAL_BASED_OUTPATIENT_CLINIC_OR_DEPARTMENT_OTHER): Payer: Medicare Other | Admitting: Internal Medicine

## 2022-08-17 ENCOUNTER — Encounter: Payer: Self-pay | Admitting: Internal Medicine

## 2022-08-17 ENCOUNTER — Inpatient Hospital Stay: Payer: Medicare Other

## 2022-08-17 ENCOUNTER — Inpatient Hospital Stay: Payer: Medicare Other | Attending: Internal Medicine

## 2022-08-17 VITALS — BP 121/98 | HR 70 | Resp 18 | Ht 65.0 in | Wt 123.0 lb

## 2022-08-17 DIAGNOSIS — R5383 Other fatigue: Secondary | ICD-10-CM | POA: Insufficient documentation

## 2022-08-17 DIAGNOSIS — D649 Anemia, unspecified: Secondary | ICD-10-CM | POA: Diagnosis not present

## 2022-08-17 DIAGNOSIS — R0602 Shortness of breath: Secondary | ICD-10-CM | POA: Insufficient documentation

## 2022-08-17 DIAGNOSIS — Z1231 Encounter for screening mammogram for malignant neoplasm of breast: Secondary | ICD-10-CM | POA: Insufficient documentation

## 2022-08-17 DIAGNOSIS — C9002 Multiple myeloma in relapse: Secondary | ICD-10-CM | POA: Insufficient documentation

## 2022-08-17 DIAGNOSIS — F419 Anxiety disorder, unspecified: Secondary | ICD-10-CM | POA: Insufficient documentation

## 2022-08-17 DIAGNOSIS — M549 Dorsalgia, unspecified: Secondary | ICD-10-CM | POA: Diagnosis not present

## 2022-08-17 DIAGNOSIS — R1310 Dysphagia, unspecified: Secondary | ICD-10-CM | POA: Insufficient documentation

## 2022-08-17 DIAGNOSIS — C9000 Multiple myeloma not having achieved remission: Secondary | ICD-10-CM

## 2022-08-17 DIAGNOSIS — N183 Chronic kidney disease, stage 3 unspecified: Secondary | ICD-10-CM | POA: Insufficient documentation

## 2022-08-17 DIAGNOSIS — Z7901 Long term (current) use of anticoagulants: Secondary | ICD-10-CM | POA: Diagnosis not present

## 2022-08-17 DIAGNOSIS — Z9484 Stem cells transplant status: Secondary | ICD-10-CM | POA: Insufficient documentation

## 2022-08-17 DIAGNOSIS — Z9481 Bone marrow transplant status: Secondary | ICD-10-CM | POA: Diagnosis not present

## 2022-08-17 DIAGNOSIS — R110 Nausea: Secondary | ICD-10-CM | POA: Diagnosis not present

## 2022-08-17 DIAGNOSIS — M791 Myalgia, unspecified site: Secondary | ICD-10-CM | POA: Diagnosis not present

## 2022-08-17 DIAGNOSIS — Z79899 Other long term (current) drug therapy: Secondary | ICD-10-CM | POA: Diagnosis not present

## 2022-08-17 LAB — CMP (CANCER CENTER ONLY)
ALT: <5 U/L (ref 0–44)
AST: 31 U/L (ref 15–41)
Albumin: 3.2 g/dL — ABNORMAL LOW (ref 3.5–5.0)
Alkaline Phosphatase: 79 U/L (ref 38–126)
Anion gap: 11 (ref 5–15)
BUN: 19 mg/dL (ref 8–23)
CO2: 20 mmol/L — ABNORMAL LOW (ref 22–32)
Calcium: 9 mg/dL (ref 8.9–10.3)
Chloride: 101 mmol/L (ref 98–111)
Creatinine: 1.3 mg/dL — ABNORMAL HIGH (ref 0.44–1.00)
GFR, Estimated: 41 mL/min — ABNORMAL LOW (ref >60)
Glucose, Bld: 82 mg/dL (ref 70–99)
Potassium: 4.2 mmol/L (ref 3.5–5.1)
Sodium: 132 mmol/L — ABNORMAL LOW (ref 135–145)
Total Bilirubin: 0.5 mg/dL (ref 0.3–1.2)
Total Protein: 7 g/dL (ref 6.5–8.1)

## 2022-08-17 LAB — CBC WITH DIFFERENTIAL (CANCER CENTER ONLY)
Abs Immature Granulocytes: 0.07 10*3/uL (ref 0.00–0.07)
Basophils Absolute: 0.1 10*3/uL (ref 0.0–0.1)
Basophils Relative: 1 %
Eosinophils Absolute: 0.1 10*3/uL (ref 0.0–0.5)
Eosinophils Relative: 1 %
HCT: 35 % — ABNORMAL LOW (ref 36.0–46.0)
Hemoglobin: 10.9 g/dL — ABNORMAL LOW (ref 12.0–15.0)
Immature Granulocytes: 1 %
Lymphocytes Relative: 11 %
Lymphs Abs: 1 10*3/uL (ref 0.7–4.0)
MCH: 30.5 pg (ref 26.0–34.0)
MCHC: 31.1 g/dL (ref 30.0–36.0)
MCV: 98 fL (ref 80.0–100.0)
Monocytes Absolute: 0.7 10*3/uL (ref 0.1–1.0)
Monocytes Relative: 8 %
Neutro Abs: 7 10*3/uL (ref 1.7–7.7)
Neutrophils Relative %: 78 %
Platelet Count: 313 10*3/uL (ref 150–400)
RBC: 3.57 MIL/uL — ABNORMAL LOW (ref 3.87–5.11)
RDW: 16.2 % — ABNORMAL HIGH (ref 11.5–15.5)
WBC Count: 8.9 10*3/uL (ref 4.0–10.5)
nRBC: 0 % (ref 0.0–0.2)

## 2022-08-17 LAB — IRON AND TIBC
Iron: 54 ug/dL (ref 28–170)
Saturation Ratios: 28 % (ref 10.4–31.8)
TIBC: 195 ug/dL — ABNORMAL LOW (ref 250–450)
UIBC: 141 ug/dL

## 2022-08-17 LAB — FERRITIN: Ferritin: 377 ng/mL — ABNORMAL HIGH (ref 11–307)

## 2022-08-17 MED ORDER — SERTRALINE HCL 50 MG PO TABS: 50.0000 mg | ORAL_TABLET | Freq: Every day | ORAL | 3 refills | Status: AC

## 2022-08-17 NOTE — Assessment & Plan Note (Addendum)
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  Kappa lambda light chain ratio-rising; Lamda light chain- 329.   Currently on daratumumab subcu with plan to add revlimid with cycle #2. Dara cycle 1; day 1 on 12/07; currently on HOLD-see below  # Continue to HOLD DARA cycle  #1-day 8 today. Labs today reviewed;  given acute issues below [recent COVID/pneumonia/declining performance status/vertebral compression fractures/pneumonia/vertebral osteomyelitis] FEB 2024- M proetin 0.3; Kappa=61[OCT 2023- BL-0.7;  BL- 329]; reviewed the myeloma panel is better just as 1 treatment.  Recommend holding any therapy at this time-given acute issues see below [pneumonia; osteomyelitis vertebral disc].  Will repeat myeloma panel in 1 month.   # Thoracic discitis/vertebral osteomyelitis- [s/p ID evaluation]- s/p PICC line-antibiotic until May 9th.  Monitor closely.  Hold off any chemotherapy at this time.  # Discussed the role of Zometa to decrease skeletal related events [pain; fractures; need for radiation; hypercalcemia].  HOLD zometa for now.  #  DEC 29th, 23rd -Nonocclusive thrombus in one of the posterior tibial veins of the right calf- continue eliquis for now.    # Compression fractures- [Dr.Jenkins; [place]] rib fractures- on hydrocodone 1 pill q 8 hrs prn; Tyelenol prn; Tinazidine BID.  On Zometa; hold for now.  See above.   # Dysphagia/weight loss- -Status post EGD/ [FEB 2024][[facility]]-abnormal motility noted; ?  Distal esophagus spasm noted question related to opioids versus others.  Recommend manometric studies; awaiting coming off narcotics.   On zyprexa 5 mg/day- stable.   # CKD stage III- IVcreatinine-creatinine GFR-32-[Dr.Singh] stable;  continue vit D  # RA- [on Plaquenil]- stable;   # Anxiety/nausea- prescribed/refilled  xanax 0.25 mg qhs; added zoloft-continue Zofran as needed; continue  zyprexa to 10 mg/day. stable;   # Vaccination:  flu shot s/p;  Covid booster; RSV- discussed   #DISPOSITION:  # HOLD Zometa # follow up in 6 week-  MD;  labs-cbc/cmp;MM panel; K/l light chains;  zometa-- Dr.B  Cc; Dr.Hande/ Dr.Singh

## 2022-08-17 NOTE — Progress Notes (Signed)
[facility] OFFICE PROGRESS NOTE  Patient Care Team: Barbette Reichmann, MD as PCP - General (Internal Medicine) Earna Coder, MD as Consulting Physician (Hematology and Oncology)  SUMMARY OF ONCOLOGIC HISTORY:  Oncology History Overview Note  # 2012- MULTIPLE MYELOMA IgG Kappa Light chain FISH del13; s/p AUTO- BMT [MAY 2013; Dr.Gabriel; [facility]] AUG 2017- M-PROTEIN NEG; K/L= 1.49/N; NO MAINTENANCE THERAPY  # OCT 2021- RELAPSED;  BMBx- plasma cells arranged in small clusters and aggregates [10% of total marrow cellularity]  Overall, the features are worrisome for early relapse of the patient's previously diagnosed plasma cell neoplasm.  No evidence of organ dysfunction; continue close surveillance.  # NOV 2023- NOV 2023- REPEAT BONE MARROW-plasma cells cell 10-20%.  Kappa lambda light chain ratio-rising; Lamda light chain- 329. NOV 2023-skeletal survey no lytic lesions.  Worsening renal insufficiency; GFR 20s; and also worsening anemia hemoglobin 9.8; iron saturation 20%.   # DEC 7th, 2023- DARA SQ+ Dex 20 mg; REV- hold cycle #1.   # CKD [~ creat 1.35- 1.7] ; Dr.Singh  # 2012- LEFT KIDNEY ? 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 benign   Multiple myeloma not having achieved remission  12/18/2015 Initial Diagnosis   Multiple myeloma in remission (HCC)   04/16/2022 -  Chemotherapy   Patient is on Treatment Plan : MYELOMA RELAPSED REFRACTORY Daratumumab SQ + Lenalidomide + Dexamethasone (DaraRd) q28d      INTERVAL HISTORY: Ambulating in a wheel chair.  Accompanied by her granddaughter.   This is a pleasant 82  year-old female patient with above history of relapsed multiple Myeloma [DEC 2023]-currently on daratumumab SQ treatments is here for follow-up.  Patient has not started Revlimid and has not received yet.  Patient received daratumumab cycle number 1 day about  on 12/07.  However, therapy currently on hold because of admission for  COVID-pneumonia/deconditioning; vertebral compression fractures/difficulty swallowing.  In the interim patient was again admitted to hospital for vertebral discitis/osteomyelitis.  Patient currently s/p evaluation with ID; and also on IV antibiotics/PICC line.  Patient appetite is improving.  Patient has noted to have improvement of her back pain.  Not taking any round-the-clock pain medication.  Complains of anxiety.  She is on Xanax 2.5 mg at nighttime.  Starting Cymbalta 20 mg twice daily.  Review of Systems  Constitutional:  Positive for malaise/fatigue. Negative for diaphoresis and weight loss.  HENT:  Negative for nosebleeds and sore throat.   Eyes:  Negative for double vision.  Respiratory:  Positive for shortness of breath. Negative for hemoptysis and wheezing.   Cardiovascular:  Negative for chest pain, palpitations, orthopnea and leg swelling.  Gastrointestinal:  Positive for nausea. Negative for abdominal pain, blood in stool, constipation, diarrhea, heartburn, melena and vomiting.  Genitourinary:  Negative for dysuria, frequency and urgency.  Musculoskeletal:  Positive for back pain, joint pain and myalgias.  Skin: Negative.  Negative for itching and rash.  Neurological:  Positive for tingling. Negative for dizziness, focal weakness, weakness and headaches.  Endo/Heme/Allergies:  Does not bruise/bleed easily.  Psychiatric/Behavioral:  Negative for depression. The patient is not nervous/anxious and does not have insomnia.     ALLERGIES:  is allergic to isoniazid.  MEDICATIONS:  Current Outpatient Medications  Medication Sig Dispense Refill   allopurinol (ZYLOPRIM) 300 MG tablet Take 300 mg by mouth daily.     ALPRAZolam (XANAX) 0.25 MG tablet TAKE 1 TABLET(0.25 MG) BY MOUTH AT BEDTIME AS NEEDED FOR ANXIETY (Patient taking differently: Take 0.25  mg by mouth as needed. Once daily for anxiety or sleep) 30 tablet 1   Ascorbic Acid (VITAMIN C) 1000 MG tablet Take 1,000 mg by mouth  daily.     atenolol (TENORMIN) 50 MG tablet Take 1 tablet (50 mg total) by mouth daily. 30 tablet 1   CALCIUM MAGNESIUM ZINC PO Take 1 tablet by mouth 2 (two) times daily.     ceFAZolin (ANCEF) 10 g injection      Cholecalciferol (VITAMIN D-3) 125 MCG (5000 UT) TABS Take 5,000 Units by mouth daily.     docusate sodium (COLACE) 100 MG capsule Take 1 capsule (100 mg total) by mouth 2 (two) times daily. 30 capsule 0   DULoxetine (CYMBALTA) 20 MG capsule Take 20 mg by mouth 2 (two) times daily.     ELIQUIS 5 MG TABS tablet TAKE 1 TABLET(5 MG) BY MOUTH TWICE DAILY (Patient taking differently: Take 5 mg by mouth 2 (two) times daily.) 60 tablet 1   hydroxychloroquine (PLAQUENIL) 200 MG tablet Take 200 mg by mouth See admin instructions. Patient currently takes 200 mg twice daily Monday through Friday and 200 mg once daily Saturday and Sunday.     Ipratropium-Albuterol (COMBIVENT) 20-100 MCG/ACT AERS respimat Inhale 1 puff into the lungs every 6 (six) hours as needed for wheezing. 4 g 0   iron polysaccharides (NIFEREX) 150 MG capsule Take 1 capsule (150 mg total) by mouth daily. 30 capsule 1   lansoprazole (PREVACID) 30 MG capsule Take 1 capsule (30 mg total) by mouth 2 (two) times daily before a meal. 60 capsule 2   levothyroxine (SYNTHROID) 112 MCG tablet Take 112 mcg by mouth daily before breakfast.     MYRBETRIQ 50 MG TB24 tablet Take 50 mg by mouth daily.     OLANZapine (ZYPREXA) 10 MG tablet Take 1 tablet (10 mg total) by mouth at bedtime. 30 tablet 3   oxybutynin (DITROPAN-XL) 10 MG 24 hr tablet Take 1 tablet (10 mg total) by mouth daily. 30 tablet 11   Potassium 99 MG TABS Take 1 tablet by mouth daily.     rOPINIRole (REQUIP) 0.5 MG tablet Take 0.5 mg by mouth at bedtime.     senna-docusate (SENOKOT-S) 8.6-50 MG tablet Take 1 tablet by mouth 2 (two) times daily. Hold if having loose or frequent stools. 30 tablet 0   sertraline (ZOLOFT) 50 MG tablet Take 1 tablet (50 mg total) by mouth daily. 30  tablet 3   Spacer/Aero-Holding Chambers DEVI 1 each by Does not apply route as needed. 1 each 0   tiZANidine (ZANAFLEX) 2 MG tablet Take 1 tablet (2 mg total) by mouth 2 (two) times daily as needed for muscle spasms. 30 tablet 0   vitamin B-12 (CYANOCOBALAMIN) 1000 MCG tablet Take 1,000 mcg by mouth daily.     zinc gluconate 50 MG tablet Take 50 mg by mouth daily.     HYDROcodone-acetaminophen (NORCO/VICODIN) 5-325 MG tablet Take 1 tablet by mouth every 4 (four) hours as needed for moderate pain or severe pain. (Patient not taking: Reported on 08/17/2022) 30 tablet 0   No current facility-administered medications for this visit.    PHYSICAL EXAMINATION: ECOG PERFORMANCE STATUS: 0 - Asymptomatic  BP (!) 121/98 (BP Location: Left Arm, Patient Position: Sitting)   Pulse 70   Resp 18   Ht 5\' 5"  (1.651 m)   Wt 123 lb (55.8 kg)   SpO2 100%   BMI 20.47 kg/m   Filed Weights   08/17/22 1125 08/17/22  1134  Weight: 130 lb (59 kg) 123 lb (55.8 kg)     Patient appears weak.   Physical Exam HENT:     Head: Normocephalic and atraumatic.     Mouth/Throat:     Pharynx: No oropharyngeal exudate.  Eyes:     Pupils: Pupils are equal, round, and reactive to light.  Cardiovascular:     Rate and Rhythm: Normal rate and regular rhythm.  Pulmonary:     Effort: No respiratory distress.     Breath sounds: No wheezing.  Abdominal:     General: Bowel sounds are normal. There is no distension.     Palpations: Abdomen is soft. There is no mass.     Tenderness: There is no abdominal tenderness. There is no guarding or rebound.  Musculoskeletal:        General: No tenderness. Normal range of motion.     Cervical back: Normal range of motion and neck supple.  Skin:    General: Skin is warm.  Neurological:     Mental Status: She is alert and oriented to person, place, and time.  Psychiatric:        Mood and Affect: Affect normal.     LABORATORY DATA:  I have reviewed the data as listed     Component Value Date/Time   NA 132 (L) 08/17/2022 1031   NA 142 08/16/2013 1028   K 4.2 08/17/2022 1031   K 4.5 08/16/2013 1028   CL 101 08/17/2022 1031   CL 106 08/16/2013 1028   CO2 20 (L) 08/17/2022 1031   CO2 27 08/16/2013 1028   GLUCOSE 82 08/17/2022 1031   GLUCOSE 99 08/16/2013 1028   BUN 19 08/17/2022 1031   BUN 21 (H) 08/16/2013 1028   CREATININE 1.30 (H) 08/17/2022 1031   CREATININE 1.42 (H) 09/07/2014 0954   CALCIUM 9.0 08/17/2022 1031   CALCIUM 9.3 09/07/2014 0954   PROT 7.0 08/17/2022 1031   PROT 7.4 08/16/2013 1028   ALBUMIN 3.2 (L) 08/17/2022 1031   ALBUMIN 3.9 08/16/2013 1028   AST 31 08/17/2022 1031   ALT <5 08/17/2022 1031   ALT 15 08/16/2013 1028   ALKPHOS 79 08/17/2022 1031   ALKPHOS 61 08/16/2013 1028   BILITOT 0.5 08/17/2022 1031   GFRNONAA 41 (L) 08/17/2022 1031   GFRNONAA 36 (L) 09/07/2014 0954   GFRAA 19 (L) 02/09/2020 1001   GFRAA 42 (L) 09/07/2014 0954    No results found for: "SPEP", "UPEP"  Lab Results  Component Value Date   WBC 8.9 08/17/2022   NEUTROABS 7.0 08/17/2022   HGB 10.9 (L) 08/17/2022   HCT 35.0 (L) 08/17/2022   MCV 98.0 08/17/2022   PLT 313 08/17/2022      Chemistry      Component Value Date/Time   NA 132 (L) 08/17/2022 1031   NA 142 08/16/2013 1028   K 4.2 08/17/2022 1031   K 4.5 08/16/2013 1028   CL 101 08/17/2022 1031   CL 106 08/16/2013 1028   CO2 20 (L) 08/17/2022 1031   CO2 27 08/16/2013 1028   BUN 19 08/17/2022 1031   BUN 21 (H) 08/16/2013 1028   CREATININE 1.30 (H) 08/17/2022 1031   CREATININE 1.42 (H) 09/07/2014 0954      Component Value Date/Time   CALCIUM 9.0 08/17/2022 1031   CALCIUM 9.3 09/07/2014 0954   ALKPHOS 79 08/17/2022 1031   ALKPHOS 61 08/16/2013 1028   AST 31 08/17/2022 1031   ALT <5 08/17/2022 1031  ALT 15 08/16/2013 1028   BILITOT 0.5 08/17/2022 1031       RADIOGRAPHIC STUDIES: I have personally reviewed the radiological images as listed and agreed with the findings in the  report. No results found.   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  Kappa lambda light chain ratio-rising; Lamda light chain- 329.   Currently on daratumumab subcu with plan to add revlimid with cycle #2. Dara cycle 1; day 1 on 12/07; currently on HOLD-see below  # Continue to HOLD DARA cycle  #1-day 8 today. Labs today reviewed;  given acute issues below [recent COVID/pneumonia/declining performance status/vertebral compression fractures/pneumonia/vertebral osteomyelitis] FEB 2024- M proetin 0.3; Kappa=61[OCT 2023- BL-0.7;  BL- 329]; reviewed the myeloma panel is better just as 1 treatment.  Recommend holding any therapy at this time-given acute issues see below [pneumonia; osteomyelitis vertebral disc].  Will repeat myeloma panel in 1 month.   # Thoracic discitis/vertebral osteomyelitis- [s/p ID evaluation]- s/p PICC line-antibiotic until May 9th.  Monitor closely.  Hold off any chemotherapy at this time.  # Discussed the role of Zometa to decrease skeletal related events [pain; fractures; need for radiation; hypercalcemia].  HOLD zometa for now.  #  DEC 29th, 23rd -Nonocclusive thrombus in one of the posterior tibial veins of the right calf- continue eliquis for now.    # Compression fractures- [Dr.Jenkins; [place]] rib fractures- on hydrocodone 1 pill q 8 hrs prn; Tyelenol prn; Tinazidine BID.  On Zometa; hold for now.  See above.   # Dysphagia/weight loss- -Status post EGD/ [FEB 2024][[facility]]-abnormal motility noted; ?  Distal esophagus spasm noted question related to opioids versus others.  Recommend manometric studies; awaiting coming off narcotics.   On zyprexa 5 mg/day- stable.   # CKD stage III- IVcreatinine-creatinine GFR-32-[Dr.Singh] stable;  continue vit D  # RA- [on Plaquenil]- stable;    # Anxiety/nausea- prescribed/refilled  xanax 0.25 mg qhs; added zoloft-continue Zofran as needed; continue zyprexa to 10 mg/day. stable;   # Vaccination:  flu shot s/p;  Covid booster; RSV- discussed   #DISPOSITION:  # HOLD Zometa # follow up in 6 week-  MD;  labs-cbc/cmp;MM panel; K/l light chains;  zometa-- Dr.B  Cc; Dr.Hande/ Dr.Singh      Earna Coder, MD 08/17/2022 2:27 PM

## 2022-08-17 NOTE — Progress Notes (Signed)
No appetite, esophagus/swallowing problems, sore stomach muscles, granddaughter reports that patient has become extremely fidgety and anxious.

## 2022-08-18 LAB — KAPPA/LAMBDA LIGHT CHAINS
Kappa free light chain: 80.9 mg/L — ABNORMAL HIGH (ref 3.3–19.4)
Kappa, lambda light chain ratio: 1.9 — ABNORMAL HIGH (ref 0.26–1.65)
Lambda free light chains: 42.6 mg/L — ABNORMAL HIGH (ref 5.7–26.3)

## 2022-08-20 LAB — MULTIPLE MYELOMA PANEL, SERUM
Albumin SerPl Elph-Mcnc: 3.2 g/dL (ref 2.9–4.4)
Albumin/Glob SerPl: 1 (ref 0.7–1.7)
Alpha 1: 0.5 g/dL — ABNORMAL HIGH (ref 0.0–0.4)
Alpha2 Glob SerPl Elph-Mcnc: 1 g/dL (ref 0.4–1.0)
B-Globulin SerPl Elph-Mcnc: 0.8 g/dL (ref 0.7–1.3)
Gamma Glob SerPl Elph-Mcnc: 1 g/dL (ref 0.4–1.8)
Globulin, Total: 3.4 g/dL (ref 2.2–3.9)
IgA: 95 mg/dL (ref 64–422)
IgG (Immunoglobin G), Serum: 1062 mg/dL (ref 586–1602)
IgM (Immunoglobulin M), Srm: 33 mg/dL (ref 26–217)
M Protein SerPl Elph-Mcnc: 0.3 g/dL — ABNORMAL HIGH
Total Protein ELP: 6.6 g/dL (ref 6.0–8.5)

## 2022-08-28 ENCOUNTER — Telehealth: Payer: Self-pay | Admitting: Pharmacist

## 2022-08-28 NOTE — Telephone Encounter (Signed)
Spoke with the patient over the phone to let her know about the changes. Pam with HH made aware to change dose to twice daily. Thanks! - Marchelle Folks

## 2022-08-28 NOTE — Telephone Encounter (Signed)
Received faxed labs from 4/16 today - Scr up from 1.3 (and ~1 at baseline) to 1.78 today. CrCl ~21 ml/min. Dosing adjustment recommended for this CrCl (between 10-30 ml/min) is 1-2g every 12 hours. Happy to call [facility] for dose adjustment and/or recommending fluids.   Please advise. Thanks!  Margarite Gouge, PharmD, CPP, BCIDP, AAHIVP Clinical Pharmacist Practitioner Infectious Diseases Clinical Pharmacist [facility]

## 2022-09-01 ENCOUNTER — Emergency Department: Payer: Medicare Other

## 2022-09-01 ENCOUNTER — Inpatient Hospital Stay
Admission: EM | Admit: 2022-09-01 | Discharge: 2022-09-07 | DRG: 683 | Disposition: A | Discharge status: Skilled Nursing Facility | Payer: Medicare Other | Attending: Internal Medicine | Admitting: Internal Medicine

## 2022-09-01 ENCOUNTER — Inpatient Hospital Stay: Payer: Medicare Other

## 2022-09-01 ENCOUNTER — Other Ambulatory Visit: Payer: Self-pay

## 2022-09-01 ENCOUNTER — Inpatient Hospital Stay
Admit: 2022-09-01 | Discharge: 2022-09-01 | Disposition: A | Discharge status: Home / Self Care | Payer: Medicare Other | Attending: Internal Medicine | Admitting: Internal Medicine

## 2022-09-01 DIAGNOSIS — K224 Dyskinesia of esophagus: Secondary | ICD-10-CM | POA: Diagnosis present

## 2022-09-01 DIAGNOSIS — W19XXXA Unspecified fall, initial encounter: Secondary | ICD-10-CM | POA: Diagnosis present

## 2022-09-01 DIAGNOSIS — E871 Hypo-osmolality and hyponatremia: Secondary | ICD-10-CM | POA: Diagnosis present

## 2022-09-01 DIAGNOSIS — Z79899 Other long term (current) drug therapy: Secondary | ICD-10-CM | POA: Diagnosis not present

## 2022-09-01 DIAGNOSIS — D638 Anemia in other chronic diseases classified elsewhere: Secondary | ICD-10-CM | POA: Diagnosis not present

## 2022-09-01 DIAGNOSIS — Z9841 Cataract extraction status, right eye: Secondary | ICD-10-CM

## 2022-09-01 DIAGNOSIS — R296 Repeated falls: Secondary | ICD-10-CM | POA: Diagnosis present

## 2022-09-01 DIAGNOSIS — N1832 Chronic kidney disease, stage 3b: Secondary | ICD-10-CM | POA: Diagnosis present

## 2022-09-01 DIAGNOSIS — T68XXXA Hypothermia, initial encounter: Secondary | ICD-10-CM

## 2022-09-01 DIAGNOSIS — I951 Orthostatic hypotension: Secondary | ICD-10-CM

## 2022-09-01 DIAGNOSIS — K2270 Barrett's esophagus without dysplasia: Secondary | ICD-10-CM | POA: Diagnosis present

## 2022-09-01 DIAGNOSIS — R531 Weakness: Secondary | ICD-10-CM

## 2022-09-01 DIAGNOSIS — E039 Hypothyroidism, unspecified: Secondary | ICD-10-CM | POA: Diagnosis not present

## 2022-09-01 DIAGNOSIS — Z66 Do not resuscitate: Secondary | ICD-10-CM | POA: Diagnosis present

## 2022-09-01 DIAGNOSIS — Z8261 Family history of arthritis: Secondary | ICD-10-CM

## 2022-09-01 DIAGNOSIS — R55 Syncope and collapse: Secondary | ICD-10-CM

## 2022-09-01 DIAGNOSIS — N179 Acute kidney failure, unspecified: Secondary | ICD-10-CM | POA: Diagnosis not present

## 2022-09-01 DIAGNOSIS — E872 Acidosis, unspecified: Secondary | ICD-10-CM | POA: Diagnosis not present

## 2022-09-01 DIAGNOSIS — E860 Dehydration: Secondary | ICD-10-CM | POA: Diagnosis present

## 2022-09-01 DIAGNOSIS — Z9481 Bone marrow transplant status: Secondary | ICD-10-CM

## 2022-09-01 DIAGNOSIS — D631 Anemia in chronic kidney disease: Secondary | ICD-10-CM | POA: Diagnosis present

## 2022-09-01 DIAGNOSIS — C9000 Multiple myeloma not having achieved remission: Secondary | ICD-10-CM

## 2022-09-01 DIAGNOSIS — N189 Chronic kidney disease, unspecified: Secondary | ICD-10-CM | POA: Insufficient documentation

## 2022-09-01 DIAGNOSIS — R6510 Systemic inflammatory response syndrome (SIRS) of non-infectious origin without acute organ dysfunction: Secondary | ICD-10-CM

## 2022-09-01 DIAGNOSIS — Z515 Encounter for palliative care: Secondary | ICD-10-CM | POA: Diagnosis not present

## 2022-09-01 DIAGNOSIS — M4624 Osteomyelitis of vertebra, thoracic region: Secondary | ICD-10-CM | POA: Diagnosis not present

## 2022-09-01 DIAGNOSIS — B9561 Methicillin susceptible Staphylococcus aureus infection as the cause of diseases classified elsewhere: Secondary | ICD-10-CM | POA: Diagnosis not present

## 2022-09-01 DIAGNOSIS — R5383 Other fatigue: Secondary | ICD-10-CM | POA: Diagnosis present

## 2022-09-01 DIAGNOSIS — R002 Palpitations: Secondary | ICD-10-CM | POA: Diagnosis not present

## 2022-09-01 DIAGNOSIS — Z9842 Cataract extraction status, left eye: Secondary | ICD-10-CM

## 2022-09-01 DIAGNOSIS — Z8616 Personal history of COVID-19: Secondary | ICD-10-CM | POA: Diagnosis not present

## 2022-09-01 DIAGNOSIS — E162 Hypoglycemia, unspecified: Secondary | ICD-10-CM | POA: Diagnosis not present

## 2022-09-01 DIAGNOSIS — Z9484 Stem cells transplant status: Secondary | ICD-10-CM | POA: Diagnosis not present

## 2022-09-01 DIAGNOSIS — Z86718 Personal history of other venous thrombosis and embolism: Secondary | ICD-10-CM

## 2022-09-01 DIAGNOSIS — Z79890 Hormone replacement therapy (postmenopausal): Secondary | ICD-10-CM

## 2022-09-01 DIAGNOSIS — I129 Hypertensive chronic kidney disease with stage 1 through stage 4 chronic kidney disease, or unspecified chronic kidney disease: Secondary | ICD-10-CM | POA: Diagnosis present

## 2022-09-01 DIAGNOSIS — F419 Anxiety disorder, unspecified: Secondary | ICD-10-CM | POA: Diagnosis present

## 2022-09-01 DIAGNOSIS — K219 Gastro-esophageal reflux disease without esophagitis: Secondary | ICD-10-CM | POA: Diagnosis present

## 2022-09-01 DIAGNOSIS — R4182 Altered mental status, unspecified: Secondary | ICD-10-CM | POA: Diagnosis present

## 2022-09-01 DIAGNOSIS — Z888 Allergy status to other drugs, medicaments and biological substances status: Secondary | ICD-10-CM

## 2022-09-01 DIAGNOSIS — Z7189 Other specified counseling: Secondary | ICD-10-CM | POA: Diagnosis not present

## 2022-09-01 DIAGNOSIS — Z8615 Personal history of latent tuberculosis infection: Secondary | ICD-10-CM

## 2022-09-01 DIAGNOSIS — M069 Rheumatoid arthritis, unspecified: Secondary | ICD-10-CM | POA: Diagnosis not present

## 2022-09-01 DIAGNOSIS — Z8249 Family history of ischemic heart disease and other diseases of the circulatory system: Secondary | ICD-10-CM | POA: Diagnosis not present

## 2022-09-01 DIAGNOSIS — Z9049 Acquired absence of other specified parts of digestive tract: Secondary | ICD-10-CM

## 2022-09-01 DIAGNOSIS — E8721 Acute metabolic acidosis: Secondary | ICD-10-CM | POA: Diagnosis present

## 2022-09-01 DIAGNOSIS — R7881 Bacteremia: Secondary | ICD-10-CM | POA: Diagnosis not present

## 2022-09-01 DIAGNOSIS — M4644 Discitis, unspecified, thoracic region: Secondary | ICD-10-CM | POA: Diagnosis not present

## 2022-09-01 DIAGNOSIS — Z7985 Long-term (current) use of injectable non-insulin antidiabetic drugs: Secondary | ICD-10-CM

## 2022-09-01 DIAGNOSIS — E785 Hyperlipidemia, unspecified: Secondary | ICD-10-CM | POA: Diagnosis present

## 2022-09-01 DIAGNOSIS — Z803 Family history of malignant neoplasm of breast: Secondary | ICD-10-CM

## 2022-09-01 DIAGNOSIS — Z823 Family history of stroke: Secondary | ICD-10-CM

## 2022-09-01 DIAGNOSIS — M810 Age-related osteoporosis without current pathological fracture: Secondary | ICD-10-CM | POA: Diagnosis present

## 2022-09-01 DIAGNOSIS — Z7901 Long term (current) use of anticoagulants: Secondary | ICD-10-CM

## 2022-09-01 DIAGNOSIS — G629 Polyneuropathy, unspecified: Secondary | ICD-10-CM | POA: Diagnosis present

## 2022-09-01 DIAGNOSIS — Z90710 Acquired absence of both cervix and uterus: Secondary | ICD-10-CM

## 2022-09-01 LAB — LACTIC ACID, PLASMA
Lactic Acid, Venous: 0.7 mmol/L (ref 0.5–1.9)
Lactic Acid, Venous: 0.7 mmol/L (ref 0.5–1.9)

## 2022-09-01 LAB — URINALYSIS, ROUTINE W REFLEX MICROSCOPIC
Bilirubin Urine: NEGATIVE
Glucose, UA: NEGATIVE mg/dL
Hgb urine dipstick: NEGATIVE
Ketones, ur: 5 mg/dL — AB
Nitrite: NEGATIVE
Protein, ur: 100 mg/dL — AB
Specific Gravity, Urine: 1.023 (ref 1.005–1.030)
pH: 5 (ref 5.0–8.0)

## 2022-09-01 LAB — BASIC METABOLIC PANEL
Anion gap: 14 (ref 5–15)
BUN: 53 mg/dL — ABNORMAL HIGH (ref 8–23)
CO2: 16 mmol/L — ABNORMAL LOW (ref 22–32)
Calcium: 9.3 mg/dL (ref 8.9–10.3)
Chloride: 100 mmol/L (ref 98–111)
Creatinine, Ser: 2.23 mg/dL — ABNORMAL HIGH (ref 0.44–1.00)
GFR, Estimated: 21 mL/min — ABNORMAL LOW (ref >60)
Glucose, Bld: 79 mg/dL (ref 70–99)
Potassium: 4.8 mmol/L (ref 3.5–5.1)
Sodium: 130 mmol/L — ABNORMAL LOW (ref 135–145)

## 2022-09-01 LAB — PROCALCITONIN: Procalcitonin: <0.1 ng/mL

## 2022-09-01 LAB — CBC
HCT: 29.4 % — ABNORMAL LOW (ref 36.0–46.0)
Hemoglobin: 9.2 g/dL — ABNORMAL LOW (ref 12.0–15.0)
MCH: 30.4 pg (ref 26.0–34.0)
MCHC: 31.3 g/dL (ref 30.0–36.0)
MCV: 97 fL (ref 80.0–100.0)
Platelets: 267 10*3/uL (ref 150–400)
RBC: 3.03 MIL/uL — ABNORMAL LOW (ref 3.87–5.11)
RDW: 18.2 % — ABNORMAL HIGH (ref 11.5–15.5)
WBC: 12.2 10*3/uL — ABNORMAL HIGH (ref 4.0–10.5)
nRBC: 0 % (ref 0.0–0.2)

## 2022-09-01 LAB — RESP PANEL BY RT-PCR (RSV, FLU A&B, COVID)  RVPGX2
Influenza A by PCR: NEGATIVE
Influenza B by PCR: NEGATIVE
Resp Syncytial Virus by PCR: NEGATIVE
SARS Coronavirus 2 by RT PCR: NEGATIVE

## 2022-09-01 LAB — BRAIN NATRIURETIC PEPTIDE: B Natriuretic Peptide: 394.4 pg/mL — ABNORMAL HIGH (ref 0.0–100.0)

## 2022-09-01 LAB — T4, FREE: Free T4: 0.61 ng/dL (ref 0.61–1.12)

## 2022-09-01 LAB — TSH: TSH: 36.712 u[IU]/mL — ABNORMAL HIGH (ref 0.350–4.500)

## 2022-09-01 LAB — TROPONIN I (HIGH SENSITIVITY): Troponin I (High Sensitivity): 10 ng/L (ref <18)

## 2022-09-01 MED ORDER — LEVOTHYROXINE SODIUM 112 MCG PO TABS
112.0000 ug | ORAL_TABLET | Freq: Every day | ORAL | Status: DC
  Administered 2022-09-02: 112 ug via ORAL
  Filled 2022-09-01: qty 1

## 2022-09-01 MED ORDER — HYDROXYCHLOROQUINE SULFATE 200 MG PO TABS
200.0000 mg | ORAL_TABLET | Freq: Every day | ORAL | Status: DC
  Administered 2022-09-03 – 2022-09-07 (×5): 200 mg via ORAL
  Filled 2022-09-01 (×6): qty 1

## 2022-09-01 MED ORDER — SODIUM CHLORIDE 0.9 % IV SOLN: Freq: Once | INTRAVENOUS | Status: AC

## 2022-09-01 MED ORDER — VANCOMYCIN HCL 1250 MG/250ML IV SOLN
1250.0000 mg | Freq: Once | INTRAVENOUS | Status: AC
  Administered 2022-09-01: 1250 mg via INTRAVENOUS
  Filled 2022-09-01 (×2): qty 250

## 2022-09-01 MED ORDER — ACETAMINOPHEN 325 MG PO TABS: 650.0000 mg | ORAL_TABLET | Freq: Four times a day (QID) | ORAL | Status: DC | PRN

## 2022-09-01 MED ORDER — APIXABAN 5 MG PO TABS
5.0000 mg | ORAL_TABLET | Freq: Two times a day (BID) | ORAL | Status: DC
  Administered 2022-09-01 – 2022-09-07 (×11): 5 mg via ORAL
  Filled 2022-09-01 (×12): qty 1

## 2022-09-01 MED ORDER — OXYBUTYNIN CHLORIDE ER 10 MG PO TB24
10.0000 mg | ORAL_TABLET | Freq: Every day | ORAL | Status: DC
  Filled 2022-09-01: qty 1

## 2022-09-01 MED ORDER — ORAL CARE MOUTH RINSE: 15.0000 mL | OROMUCOSAL | Status: DC | PRN

## 2022-09-01 MED ORDER — CEFAZOLIN SODIUM-DEXTROSE 2-4 GM/100ML-% IV SOLN
2.0000 g | Freq: Two times a day (BID) | INTRAVENOUS | Status: DC
  Administered 2022-09-01 – 2022-09-07 (×12): 2 g via INTRAVENOUS
  Filled 2022-09-01 (×13): qty 100

## 2022-09-01 MED ORDER — MIRABEGRON ER 50 MG PO TB24: 50.0000 mg | ORAL_TABLET | Freq: Every day | ORAL | Status: DC

## 2022-09-01 MED ORDER — LACTATED RINGERS IV SOLN: INTRAVENOUS | Status: DC

## 2022-09-01 MED ORDER — VANCOMYCIN HCL IN DEXTROSE 1-5 GM/200ML-% IV SOLN
1000.0000 mg | Freq: Once | INTRAVENOUS | Status: DC
  Filled 2022-09-01: qty 200

## 2022-09-01 MED ORDER — ONDANSETRON HCL 4 MG PO TABS: 4.0000 mg | ORAL_TABLET | Freq: Four times a day (QID) | ORAL | Status: DC | PRN

## 2022-09-01 MED ORDER — OLANZAPINE 10 MG PO TABS
10.0000 mg | ORAL_TABLET | Freq: Every day | ORAL | Status: DC
  Administered 2022-09-01: 10 mg via ORAL
  Filled 2022-09-01: qty 1

## 2022-09-01 MED ORDER — IPRATROPIUM-ALBUTEROL 0.5-2.5 (3) MG/3ML IN SOLN: 3.0000 mL | Freq: Four times a day (QID) | RESPIRATORY_TRACT | Status: DC | PRN

## 2022-09-01 MED ORDER — ALPRAZOLAM 0.25 MG PO TABS
0.2500 mg | ORAL_TABLET | Freq: Every day | ORAL | Status: DC | PRN
  Administered 2022-09-01: 0.25 mg via ORAL
  Filled 2022-09-01: qty 1

## 2022-09-01 MED ORDER — ACETAMINOPHEN 650 MG RE SUPP: 650.0000 mg | Freq: Four times a day (QID) | RECTAL | Status: DC | PRN

## 2022-09-01 MED ORDER — PANTOPRAZOLE SODIUM 20 MG PO TBEC
20.0000 mg | DELAYED_RELEASE_TABLET | Freq: Every day | ORAL | Status: DC
  Administered 2022-09-03 – 2022-09-07 (×5): 20 mg via ORAL
  Filled 2022-09-01 (×6): qty 1

## 2022-09-01 MED ORDER — ROPINIROLE HCL 0.25 MG PO TABS
0.5000 mg | ORAL_TABLET | Freq: Every day | ORAL | Status: DC
  Administered 2022-09-01: 0.5 mg via ORAL
  Filled 2022-09-01: qty 2

## 2022-09-01 MED ORDER — HYDROXYCHLOROQUINE SULFATE 200 MG PO TABS
200.0000 mg | ORAL_TABLET | ORAL | Status: DC
  Administered 2022-09-01 – 2022-09-04 (×4): 200 mg via ORAL
  Filled 2022-09-01 (×4): qty 1

## 2022-09-01 MED ORDER — SODIUM CHLORIDE 0.9 % IV BOLUS
1000.0000 mL | Freq: Once | INTRAVENOUS | Status: AC
  Administered 2022-09-01: 1000 mL via INTRAVENOUS

## 2022-09-01 MED ORDER — CHLORHEXIDINE GLUCONATE CLOTH 2 % EX PADS
6.0000 | MEDICATED_PAD | Freq: Every day | CUTANEOUS | Status: DC
  Administered 2022-09-02 – 2022-09-03 (×2): 6 via TOPICAL

## 2022-09-01 MED ORDER — VANCOMYCIN VARIABLE DOSE PER UNSTABLE RENAL FUNCTION (PHARMACIST DOSING): Status: DC

## 2022-09-01 MED ORDER — POLYSACCHARIDE IRON COMPLEX 150 MG PO CAPS
150.0000 mg | ORAL_CAPSULE | Freq: Every day | ORAL | Status: DC
  Administered 2022-09-03 – 2022-09-07 (×5): 150 mg via ORAL
  Filled 2022-09-01 (×6): qty 1

## 2022-09-01 MED ORDER — DULOXETINE HCL 20 MG PO CPEP
20.0000 mg | ORAL_CAPSULE | Freq: Two times a day (BID) | ORAL | Status: DC
  Filled 2022-09-01: qty 1

## 2022-09-01 MED ORDER — ONDANSETRON HCL 4 MG/2ML IJ SOLN: 4.0000 mg | Freq: Four times a day (QID) | INTRAMUSCULAR | Status: DC | PRN

## 2022-09-01 MED ORDER — POLYETHYLENE GLYCOL 3350 17 G PO PACK: 17.0000 g | PACK | Freq: Every day | ORAL | Status: DC | PRN

## 2022-09-01 MED ORDER — SODIUM CHLORIDE 0.9% FLUSH
3.0000 mL | Freq: Two times a day (BID) | INTRAVENOUS | Status: DC
  Administered 2022-09-01 – 2022-09-07 (×12): 3 mL via INTRAVENOUS

## 2022-09-01 MED ORDER — METRONIDAZOLE 500 MG/100ML IV SOLN
500.0000 mg | Freq: Once | INTRAVENOUS | Status: AC
  Administered 2022-09-01: 500 mg via INTRAVENOUS
  Filled 2022-09-01: qty 100

## 2022-09-01 MED ORDER — SERTRALINE HCL 50 MG PO TABS: 50.0000 mg | ORAL_TABLET | Freq: Every day | ORAL | Status: DC

## 2022-09-01 MED ORDER — LACTATED RINGERS IV BOLUS
500.0000 mL | Freq: Once | INTRAVENOUS | Status: AC
  Administered 2022-09-01: 500 mL via INTRAVENOUS

## 2022-09-01 NOTE — Assessment & Plan Note (Addendum)
TSH elevated at 36.7.  Increased dose of levothyroxine to 137 mcg.

## 2022-09-01 NOTE — Assessment & Plan Note (Addendum)
Continue cefazolin as per ID through 09/17/2022.

## 2022-09-01 NOTE — Sepsis Progress Note (Signed)
Code Sepsis protocol being monitored by eLink. 

## 2022-09-01 NOTE — Consult Note (Signed)
NAME: Sydney Gillespie  DOB: [birth year]  MRN: [id number]  Date/Time: 09/01/2022 10:07 PM  REQUESTING PROVIDER Subjective:  REASON FOR CONSULT:  ? Sydney Gillespie is a 82 y.o. 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 She was sent home on IV cefazolin Q8 until 09/17/22 Presents by EMS after a fall and possible syncopal episode Pt as per grand daughter was doing okay at baseline till yesterday- she has had low BP and orthostasis noted by her nurse for the past couple of times Today the nurse went to her house and found her on the floor and couldn't get her up and called EMS. They found her on the floor and she told them that was trying to get something from the kitchen and felt dizzy and fell backwards- she was awake and alert and She initially did not want to to come to ED In the ED vitals  09/01/22 13:00  BP 97/48 !  Temp  91  Pulse Rate 48 (L)  Resp 19  SpO2 100 %    Latest Reference Range & Units 09/01/22 13:00  WBC 4.0 - 10.5 K/uL 12.2 (H)  Hemoglobin 12.0 - 15.0 g/dL 9.2 (L)  HCT 47.8 - 29.5 % 29.4 (L)  Platelets 150 - 400 K/uL 267  Creatinine 0.44 - 1.00 mg/dL 6.21 (H)  Lactate and procal normal Was given vanco Pt has been taking alprozalam, zyprexa, sertraline Past Medical History:  Diagnosis Date   Acute respiratory failure with hypoxia 05/07/2022   Acute respiratory failure with hypoxia 05/07/2022   Allergic rhinitis    Allergy    Anemia    Anemia     Barrett's esophagus    Blood dyscrasia    multiple myloma remission   Cancer    Change in bowel habits    Compression fracture 2013   Degenerative disc disease, lumbar    Dysrhythmia    palpitations   Esophageal reflux    Family history of adverse reaction to anesthesia    nausea -mom   GERD (gastroesophageal reflux disease)    Gout    H/O bone marrow transplant    Heart palpitations    Hyperlipidemia    Hypothyroidism    Inflammatory polyarthropathy    Lumbago    Lumbar radiculitis    Lumbar stenosis with neurogenic claudication    Multiple myeloma    Multiple myeloma    Neuropathy    Bilateral Feet   Osteopenia    Osteoporosis    Other dysphagia    Palpitations    Pneumonia 04/2022   R/T COVID   Positive PPD    Renal insufficiency    Rheumatoid arthritis    Rheumatoid arthritis    Seasonal allergies    Tuberculosis 2011   Vertigo     Past Surgical History:  Procedure Laterality Date   ABDOMINAL HYSTERECTOMY     APPENDECTOMY     BACK SURGERY     BREAST EXCISIONAL BIOPSY Bilateral    neg   CATARACT EXTRACTION Bilateral    CHOLECYSTECTOMY  COLONOSCOPY WITH PROPOFOL N/A 11/12/2016   Procedure: COLONOSCOPY WITH PROPOFOL;  Surgeon: Scot Jun, MD;  Location: [facility] ENDOSCOPY;  Service: Endoscopy;  Laterality: N/A;   ESOPHAGOGASTRODUODENOSCOPY (EGD) WITH PROPOFOL N/A 11/12/2016   Procedure: ESOPHAGOGASTRODUODENOSCOPY (EGD) WITH PROPOFOL;  Surgeon: Scot Jun, MD;  Location: [facility] ENDOSCOPY;  Service: Endoscopy;  Laterality: N/A;   ESOPHAGOGASTRODUODENOSCOPY (EGD) WITH PROPOFOL N/A 07/17/2022   Procedure: ESOPHAGOGASTRODUODENOSCOPY (EGD) WITH PROPOFOL;  Surgeon: Regis Bill, MD;  Location: [facility] ENDOSCOPY;  Service: Endoscopy;  Laterality: N/A;   FOOT SURGERY     KYPHOPLASTY N/A 07/29/2022   Procedure: KYPHOPLASTY THORACIC EIGHT, THORACIC NINE, THORACIC TEN;  Surgeon: Tressie Stalker, MD;  Location: [facility] OR;  Service: Neurosurgery;  Laterality:  N/A;  3C   LUMBAR LAMINECTOMY/DECOMPRESSION MICRODISCECTOMY Left 06/18/2016   Procedure: LAMINECTOMY FOR FACET/SYNOVIAL CYST LUMBAR THREE - LUMBAR FOUR LEFT;  Surgeon: Tressie Stalker, MD;  Location: [facility] OR;  Service: Neurosurgery;  Laterality: Left;  LAMINECTOMY FOR FACET/SYNOVIAL CYST LUMBAR THREE - LUMBAR FOUR LEFT    Social History   Socioeconomic History   Marital status: Widowed    Spouse name: Not on file   Number of children: Not on file   Years of education: Not on file   Highest education level: Not on file  Occupational History   Not on file  Tobacco Use   Smoking status: Never   Smokeless tobacco: Never  Vaping Use   Vaping Use: Never used  Substance and Sexual Activity   Alcohol use: No   Drug use: No   Sexual activity: Yes  Other Topics Concern   Not on file  Social History Narrative   Not on file   Social Determinants of Health   Financial Resource Strain: Not on file  Food Insecurity: No Food Insecurity (08/05/2022)   Hunger Vital Sign    Worried About Running Out of Food in the Last Year: Never true    Ran Out of Food in the Last Year: Never true  Transportation Needs: No Transportation Needs (08/05/2022)   PRAPARE - Administrator, Civil Service (Medical): No    Lack of Transportation (Non-Medical): No  Physical Activity: Not on file  Stress: Not on file  Social Connections: Not on file  Intimate Partner Violence: Not At Risk (08/05/2022)   Humiliation, Afraid, Rape, and Kick questionnaire    Fear of Current or Ex-Partner: No    Emotionally Abused: No    Physically Abused: No    Sexually Abused: No    Family History  Problem Relation Age of Onset   Hypertension Mother    Heart attack Mother    Rheum arthritis Mother    Osteoarthritis Mother    Cancer Father    Hypertension Father    Stroke Father    Breast cancer Maternal Grandmother 40   Osteoarthritis Other    Rheum arthritis Other    Allergies  Allergen Reactions   Isoniazid  Other (See Comments)    Blisters    I? Current Facility-Administered Medications  Medication Dose Route Frequency Provider Last Rate Last Admin   acetaminophen (TYLENOL) tablet 650 mg  650 mg Oral Q6H PRN Verdene Lennert, MD       Or   acetaminophen (TYLENOL) suppository 650 mg  650 mg Rectal Q6H PRN Verdene Lennert, MD       ALPRAZolam Prudy Feeler) tablet 0.25 mg  0.25 mg Oral Daily PRN Verdene Lennert, MD   0.25 mg at 09/01/22 2101   apixaban (ELIQUIS)  tablet 5 mg  5 mg Oral BID Verdene Lennert, MD   5 mg at 09/01/22 2101   ceFAZolin (ANCEF) IVPB 2g/100 mL premix  2 g Intravenous Q12H Verdene Lennert, MD 200 mL/hr at 09/01/22 2100 2 g at 09/01/22 2100   [START ON 09/02/2022] DULoxetine (CYMBALTA) DR capsule 20 mg  20 mg Oral BID Verdene Lennert, MD       Melene Muller ON 09/02/2022] hydroxychloroquine (PLAQUENIL) tablet 200 mg  200 mg Oral Daily Verdene Lennert, MD       hydroxychloroquine (PLAQUENIL) tablet 200 mg  200 mg Oral Once per day on Mon Tue Wed Thu Fri Sharen Hones, RPH   200 mg at 09/01/22 2101   ipratropium-albuterol (DUONEB) 0.5-2.5 (3) MG/3ML nebulizer solution 3 mL  3 mL Nebulization Q6H PRN Verdene Lennert, MD       [START ON 09/02/2022] iron polysaccharides (NIFEREX) capsule 150 mg  150 mg Oral Daily Verdene Lennert, MD       lactated ringers infusion   Intravenous Continuous Verdene Lennert, MD 75 mL/hr at 09/01/22 1842 New Bag at 09/01/22 1842   [START ON 09/02/2022] levothyroxine (SYNTHROID) tablet 112 mcg  112 mcg Oral Q0600 Verdene Lennert, MD       [START ON 09/03/2022] mirabegron ER (MYRBETRIQ) tablet 50 mg  50 mg Oral Daily Verdene Lennert, MD       OLANZapine (ZYPREXA) tablet 10 mg  10 mg Oral QHS Verdene Lennert, MD   10 mg at 09/01/22 2101   ondansetron (ZOFRAN) tablet 4 mg  4 mg Oral Q6H PRN Verdene Lennert, MD       Or   ondansetron (ZOFRAN) injection 4 mg  4 mg Intravenous Q6H PRN Verdene Lennert, MD       [START ON 09/02/2022] oxybutynin (DITROPAN-XL) 24 hr tablet 10 mg   10 mg Oral Daily Verdene Lennert, MD       Melene Muller ON 09/02/2022] pantoprazole (PROTONIX) EC tablet 20 mg  20 mg Oral Daily Verdene Lennert, MD       polyethylene glycol (MIRALAX / GLYCOLAX) packet 17 g  17 g Oral Daily PRN Verdene Lennert, MD       rOPINIRole (REQUIP) tablet 0.5 mg  0.5 mg Oral QHS Verdene Lennert, MD   0.5 mg at 09/01/22 2101   [START ON 09/02/2022] sertraline (ZOLOFT) tablet 50 mg  50 mg Oral Daily Verdene Lennert, MD       sodium chloride flush (NS) 0.9 % injection 3 mL  3 mL Intravenous Q12H Verdene Lennert, MD   3 mL at 09/01/22 2101   vancomycin variable dose per unstable renal function (pharmacist dosing)   Does not apply See admin instructions Verdene Lennert, MD         Abtx:  Anti-infectives (From admission, onward)    Start     Dose/Rate Route Frequency Ordered Stop   09/02/22 1000  hydroxychloroquine (PLAQUENIL) tablet 200 mg       Note to Pharmacy: Patient currently takes 200 mg twice daily Monday through Friday and 200 mg once daily Saturday and Sunday.     200 mg Oral Daily 09/01/22 1643     09/01/22 2200  ceFAZolin (ANCEF) IVPB 2g/100 mL premix        2 g 200 mL/hr over 30 Minutes Intravenous Every 12 hours 09/01/22 1615     09/01/22 2200  hydroxychloroquine (PLAQUENIL) tablet 200 mg        20 0 mg Oral Once per day on Mon Tue Wed Thu Fri  09/01/22 1742     09/01/22 1800  vancomycin (VANCOREADY) IVPB 1250 mg/250 mL        1,250 mg 166.7 mL/hr over 90 Minutes Intravenous  Once 09/01/22 1642 09/01/22 2016   09/01/22 1645  vancomycin variable dose per unstable renal function (pharmacist dosing)         Does not apply See admin instructions 09/01/22 1645     09/01/22 1515  metroNIDAZOLE (FLAGYL) IVPB 500 mg        500 mg 100 mL/hr over 60 Minutes Intravenous  Once 09/01/22 1514 09/01/22 1723   09/01/22 1515  vancomycin (VANCOCIN) IVPB 1000 mg/200 mL premix  Status:  Discontinued        1,000 mg 200 mL/hr over 60 Minutes Intravenous  Once 09/01/22 1514 09/01/22  1642       REVIEW OF SYSTEMS:  Const: negative fever, negative chills, negative weight loss Eyes: negative diplopia or visual changes, negative eye pain ENT: negative coryza, negative sore throat Resp: negative cough, hemoptysis, dyspnea Cards: negative for chest pain, palpitations, lower extremity edema GU: negative for frequency, dysuria and hematuria GI: Negative for abdominal pain, diarrhea, bleeding, constipation Skin: negative for rash and pruritus Heme:  easy bruising a MS: weakness legs Back pain much better Neurolo:, dizziness,  Psych:  anxiety, depression  Endocrine: negative for thyroid, diabetes Allergy/Immunology- isoniazid ? Pertinent Positives include : Objective:  VITALS:  BP (!) 103/49 (BP Location: Left Arm)   Pulse (!) 58   Temp (!) 96 F (35.6 C) (Rectal)   Resp 18   Wt 55 kg   SpO2 100%   BMI 20.18 kg/m  LDA PICC PHYSICAL EXAM:  General: Alert, cooperative, no distress, appears stated age. frail Head: Normocephalic, without obvious abnormality, atraumatic. Eyes: Conjunctivae clear, anicteric sclerae. Pupils are equal ENT Nares normal. No drainage or sinus tenderness. Lips, mucosa, and tongue normal. No Thrush Neck: Supple, symmetrical, no adenopathy, thyroid: non tender no carotid bruit and no JVD. Back: No CVA tenderness. Lungs: Clear to auscultation bilaterally. No Wheezing or Rhonchi. No rales. Heart: Regular rate and rhythm, no murmur, rub or gallop. Abdomen: Soft, non-tender,not distended. Bowel sounds normal. No masses Extremities: atraumatic, no cyanosis. No edema. No clubbing Skin:  bruising ++ Lymph: Cervical, supraclavicular normal. Neurologic: Grossly non-focal Pertinent Labs Lab Results CBC    Component Value Date/Time   WBC 12.2 (H) 09/01/2022 1300   RBC 3.03 (L) 09/01/2022 1300   HGB 9.2 (L) 09/01/2022 1300   HGB 10.9 (L) 08/17/2022 1031   HGB 12.5 09/07/2014 0954   HCT 29.4 (L) 09/01/2022 1300   HCT 38.1 09/07/2014  0954   PLT 267 09/01/2022 1300   PLT 313 08/17/2022 1031   PLT 159 09/07/2014 0954   MCV 97.0 09/01/2022 1300   MCV 94 09/07/2014 0954   MCH 30.4 09/01/2022 1300   MCHC 31.3 09/01/2022 1300   RDW 18.2 (H) 09/01/2022 1300   RDW 13.8 09/07/2014 0954   LYMPHSABS 1.0 08/17/2022 1031   LYMPHSABS 1.0 09/07/2014 0954   MONOABS 0.7 08/17/2022 1031   MONOABS 0.5 09/07/2014 0954   EOSABS 0.1 08/17/2022 1031   EOSABS 0.2 09/07/2014 0954   BASOSABS 0.1 08/17/2022 1031   BASOSABS 0.0 09/07/2014 0954       Latest Ref Rng & Units 09/01/2022    1:00 PM 08/17/2022   10:31 AM 08/12/2022    4:59 AM  CMP  Glucose 70 - 99 mg/dL 79  82  161   BUN 8 - 23  mg/dL 53  19  17   Creatinine 0.44 - 1.00 mg/dL 1.61  0.96  0.45   Sodium 135 - 145 mmol/L 130  132  136   Potassium 3.5 - 5.1 mmol/L 4.8  4.2  3.8   Chloride 98 - 111 mmol/L 100  101  105   CO2 22 - 32 mmol/L 16  20  24    Calcium 8.9 - 10.3 mg/dL 9.3  9.0  8.5   Total Protein 6.5 - 8.1 g/dL  7.0    Total Bilirubin 0.3 - 1.2 mg/dL  0.5    Alkaline Phos 38 - 126 U/L  79    AST 15 - 41 U/L  31    ALT 0 - 44 U/L  <5        Microbiology: Recent Results (from the past 240 hour(s))  Resp panel by RT-PCR (RSV, Flu A&B, Covid) Anterior Nasal Swab     Status: None   Collection Time: 09/01/22  3:12 PM   Specimen: Anterior Nasal Swab  Result Value Ref Range Status   SARS Coronavirus 2 by RT PCR NEGATIVE NEGATIVE Final    Comment: (NOTE) SARS-CoV-2 target nucleic acids are NOT DETECTED.  The SARS-CoV-2 RNA is generally detectable in upper respiratory specimens during the acute phase of infection. The lowest concentration of SARS-CoV-2 viral copies this assay can detect is 138 copies/mL. A negative result does not preclude SARS-Cov-2 infection and should not be used as the sole basis for treatment or other patient management decisions. A negative result may occur with  improper specimen collection/handling, submission of specimen other than  nasopharyngeal swab, presence of viral mutation(s) within the areas targeted by this assay, and inadequate number of viral copies(<138 copies/mL). A negative result must be combined with clinical observations, patient history, and epidemiological information. The expected result is Negative.  Fact Sheet for Patients:  BloggerCourse.com  Fact Sheet for Healthcare Providers:  SeriousBroker.it  This test is no t yet approved or cleared by the [place] FDA and  has been authorized for detection and/or diagnosis of SARS-CoV-2 by FDA under an Emergency Use Authorization (EUA). This EUA will remain  in effect (meaning this test can be used) for the duration of the COVID-19 declaration under Section 564(b)(1) of the Act, 21 U.S.C.section 360bbb-3(b)(1), unless the authorization is terminated  or revoked sooner.       Influenza A by PCR NEGATIVE NEGATIVE Final   Influenza B by PCR NEGATIVE NEGATIVE Final    Comment: (NOTE) The Xpert Xpress SARS-CoV-2/FLU/RSV plus assay is intended as an aid in the diagnosis of influenza from Nasopharyngeal swab specimens and should not be used as a sole basis for treatment. Nasal washings and aspirates are unacceptable for Xpert Xpress SARS-CoV-2/FLU/RSV testing.  Fact Sheet for Patients: BloggerCourse.com  Fact Sheet for Healthcare Providers: SeriousBroker.it  This test is not yet approved or cleared by the [place] FDA and has been authorized for detection and/or diagnosis of SARS-CoV-2 by FDA under an Emergency Use Authorization (EUA). This EUA will remain in effect (meaning this test can be used) for the duration of the COVID-19 declaration under Section 564(b)(1) of the Act, 21 U.S.C. section 360bbb-3(b)(1), unless the authorization is terminated or revoked.     Resp Syncytial Virus by PCR NEGATIVE NEGATIVE Final    Comment:  (NOTE) Fact Sheet for Patients: BloggerCourse.com  Fact Sheet for Healthcare Providers: SeriousBroker.it  This test is not yet approved or cleared by the [place] FDA and has been  authorized for detection and/or diagnosis of SARS-CoV-2 by FDA under an Emergency Use Authorization (EUA). This EUA will remain in effect (meaning this test can be used) for the duration of the COVID-19 declaration under Section 564(b)(1) of the Act, 21 U.S.C. section 360bbb-3(b)(1), unless the authorization is terminated or revoked.  Performed at [facility], [address] [place], [place] [address]     IMAGING RESULTS: CT head No acute findings CXR no infiltrate I have personally reviewed the films ? Impression/Recommendation ?  Fall,  Syncope or presyncope-Postural hypotension Likely combination of Medication ( xanax, zyprexia, zoloft) + dehydration due to poor appetite Also has hypothermia-  R/o adrenal insufficiency Check TSh Normal lactate and procal Less likely sepsis Will not give any more vanco  MSSA bacteremia 07/29/22 underwent kyphoplasty of T8-T9-T10 for compression fracture and a bone biopsy done during  kyphoplasty/pathology of the bone was acute osteomyelitis MRI done 3/29 shows discitis T8-T9 and they referenced MRI from 2/29 which had features of discitis and epidural phlegmon Repeat Blood culture negative 2 d echo no vegetation No need to get TEE as the management will not change as she will need 6 weeks of IV antibiotic On cefazolin until 09/17/22 ( 6 weeks)- may do Po antibiotic after that   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 ___________________________________________________ [facility] physician will follow from tomorrow 09/02/22 Note:  This document was prepared using Dragon voice recognition software and may include unintentional dictation errors.

## 2022-09-01 NOTE — H&P (Addendum)
History and Physical    Patient: Sydney Gillespie ZOX:[id number] DOB: 1940/06/22 DOA: 09/01/2022 DOS: the patient was seen and examined on 09/01/2022 PCP: Barbette Reichmann, MD  Patient coming from: Home  Chief Complaint:  Chief Complaint  Patient presents with   Near Syncope   HPI: Sydney Gillespie is a 82 y.o. female with medical history significant of recent MSSA bacteremia on cefazolin, multiple myeloma, CKD stage IIIb, compression fracture s/p kyphoplasty, hypertension, hyperlipidemia, rheumatoid arthritis, who presents to the ED due to near syncope.  Sydney Gillespie states that she had been up and walking around this morning when she reached up to grab something from a cabinet and started seeing spots.  She subsequently lost consciousness.  Sydney Gillespie denies knowingly hitting her head.  A home health aide found her on the ground and EMS was called.  Patient's granddaughter at bedside states that over the last several weeks, patient has had intermittent dizziness that has been difficult for them to discern from her usual vertigo.  She denies any prior loss of consciousness over the last few weeks.  Sydney Gillespie also endorses frequent falls but states that it is usually due to bilateral lower extremity weakness that is chronic and unchanged.  She denies any known fevers, chills, abdominal pain, diarrhea, shortness of breath, cough, chest pain or palpitations.  She notes one-time episode of nausea with vomiting yesterday with no further episodes of nausea afterwards.  Per chart review, patient was recently admitted for MSSA bacteremia and discharged on cefazolin.  ED course: On arrival to the ED, patient was hypotensive at 97/48 with heart rate of 48.  She was saturating at 100% on room air.  Rectal temperature severely low at 91.  Initial workup notable for WBC of 12.2, hemoglobin 9.2, potassium 4.8, BUN 53, creatinine 2.23 with GFR of 21.  Troponin negative at 10.  Chest x-ray pending.   Patient started on vancomycin, IV fluids and TR[facility] contacted for admission.  Review of Systems: As mentioned in the history of present illness. All other systems reviewed and are negative.  Past Medical History:  Diagnosis Date   Acute respiratory failure with hypoxia 05/07/2022   Acute respiratory failure with hypoxia 05/07/2022   Allergic rhinitis    Allergy    Anemia    Anemia    Barrett's esophagus    Blood dyscrasia    multiple myloma remission   Cancer    Change in bowel habits    Compression fracture 2013   Degenerative disc disease, lumbar    Dysrhythmia    palpitations   Esophageal reflux    Family history of adverse reaction to anesthesia    nausea -mom   GERD (gastroesophageal reflux disease)    Gout    H/O bone marrow transplant    Heart palpitations    Hyperlipidemia    Hypothyroidism    Inflammatory polyarthropathy    Lumbago    Lumbar radiculitis    Lumbar stenosis with neurogenic claudication    Multiple myeloma    Multiple myeloma    Neuropathy    Bilateral Feet   Osteopenia    Osteoporosis    Other dysphagia    Palpitations    Pneumonia 04/2022   R/T COVID   Positive PPD    Renal insufficiency    Rheumatoid arthritis    Rheumatoid arthritis    Seasonal allergies    Tuberculosis 2011   Vertigo    Past Surgical History:  Procedure Laterality Date  ABDOMINAL HYSTERECTOMY     APPENDECTOMY     BACK SURGERY     BREAST EXCISIONAL BIOPSY Bilateral    neg   CATARACT EXTRACTION Bilateral    CHOLECYSTECTOMY     COLONOSCOPY WITH PROPOFOL N/A 11/12/2016   Procedure: COLONOSCOPY WITH PROPOFOL;  Surgeon: Scot Jun, MD;  Location[facility] ENDOSCOPY;  Service: Endoscopy;  Laterality: N/A;   ESOPHAGOGASTRODUODENOSCOPY (EGD) WITH PROPOFOL N/A 11/12/2016   Procedure: ESOPHAGOGASTRODUODENOSCOPY (EGD) WITH PROPOFOL;  Surgeon: Scot Jun, MD;  Location: [facility] ENDOSCOPY;  Service: Endoscopy;  Laterality: N/A;   ESOPHAGOGASTRODUODENOSCOPY (EGD) WITH  PROPOFOL N/A 07/17/2022   Procedure: ESOPHAGOGASTRODUODENOSCOPY (EGD) WITH PROPOFOL;  Surgeon: Regis Bill, MD;  Location: [facility] ENDOSCOPY;  Service: Endoscopy;  Laterality: N/A;   FOOT SURGERY     KYPHOPLASTY N/A 07/29/2022   Procedure: KYPHOPLASTY THORACIC EIGHT, THORACIC NINE, THORACIC TEN;  Surgeon: Tressie Stalker, MD;  Location: [facility] OR;  Service: Neurosurgery;  Laterality: N/A;  3C   LUMBAR LAMINECTOMY/DECOMPRESSION MICRODISCECTOMY Left 06/18/2016   Procedure: LAMINECTOMY FOR FACET/SYNOVIAL CYST LUMBAR THREE - LUMBAR FOUR LEFT;  Surgeon: Tressie Stalker, MD;  Location: [facility] OR;  Service: Neurosurgery;  Laterality: Left;  LAMINECTOMY FOR FACET/SYNOVIAL CYST LUMBAR THREE - LUMBAR FOUR LEFT   Social History:  reports that she has never smoked. She has never used smokeless tobacco. She reports that she does not drink alcohol and does not use drugs.  Allergies  Allergen Reactions   Isoniazid Other (See Comments)    Blisters     Family History  Problem Relation Age of Onset   Hypertension Mother    Heart attack Mother    Rheum arthritis Mother    Osteoarthritis Mother    Cancer Father    Hypertension Father    Stroke Father    Breast cancer Maternal Grandmother 40   Osteoarthritis Other    Rheum arthritis Other     Prior to Admission medications   Medication Sig Start Date End Date Taking? Authorizing Provider  allopurinol (ZYLOPRIM) 300 MG tablet Take 300 mg by mouth daily. 09/14/14   [provider]  ALPRAZolam (XANAX) 0.25 MG tablet TAKE 1 TABLET(0.25 MG) BY MOUTH AT BEDTIME AS NEEDED FOR ANXIETY Patient taking differently: Take 0.25 mg by mouth as needed. Once daily for anxiety or sleep 04/24/22   Earna Coder, MD  Ascorbic Acid (VITAMIN C) 1000 MG tablet Take 1,000 mg by mouth daily.    [provider]  atenolol (TENORMIN) 50 MG tablet Take 1 tablet (50 mg total) by mouth daily. 08/13/22   Esaw Grandchild A, DO  CALCIUM MAGNESIUM ZINC PO Take 1  tablet by mouth 2 (two) times daily.    [provider]  ceFAZolin (ANCEF) 10 g injection  08/12/22   [provider]  Cholecalciferol (VITAMIN D-3) 125 MCG (5000 UT) TABS Take 5,000 Units by mouth daily.    [provider]  docusate sodium (COLACE) 100 MG capsule Take 1 capsule (100 mg total) by mouth 2 (two) times daily. 07/30/22   Tressie Stalker, MD  DULoxetine (CYMBALTA) 20 MG capsule Take 20 mg by mouth 2 (two) times daily. 10/02/20   [provider]  ELIQUIS 5 MG TABS tablet TAKE 1 TABLET(5 MG) BY MOUTH TWICE DAILY Patient taking differently: Take 5 mg by mouth 2 (two) times daily. 08/03/22   Earna Coder, MD  HYDROcodone-acetaminophen (NORCO/VICODIN) 5-325 MG tablet Take 1 tablet by mouth every 4 (four) hours as needed for moderate pain or severe pain.  Patient not taking: Reported on 08/17/2022 08/12/22   Esaw Grandchild A, DO  hydroxychloroquine (PLAQUENIL) 200 MG tablet Take 200 mg by mouth See admin instructions. Patient currently takes 200 mg twice daily Monday through Friday and 200 mg once daily Saturday and Sunday. 10/25/18   [provider]  Ipratropium-Albuterol (COMBIVENT) 20-100 MCG/ACT AERS respimat Inhale 1 puff into the lungs every 6 (six) hours as needed for wheezing. 05/10/22   Pennie Banter, DO  iron polysaccharides (NIFEREX) 150 MG capsule Take 1 capsule (150 mg total) by mouth daily. 08/13/22   Pennie Banter, DO  lansoprazole (PREVACID) 30 MG capsule Take 1 capsule (30 mg total) by mouth 2 (two) times daily before a meal. 06/12/22   Earna Coder, MD  levothyroxine (SYNTHROID) 112 MCG tablet Take 112 mcg by mouth daily before breakfast. 01/16/22   [provider]  MYRBETRIQ 50 MG TB24 tablet Take 50 mg by mouth daily.    [provider]  OLANZapine (ZYPREXA) 10 MG tablet Take 1 tablet (10 mg total) by mouth at bedtime. 07/20/22   Earna Coder, MD  oxybutynin (DITROPAN-XL) 10 MG 24 hr tablet  Take 1 tablet (10 mg total) by mouth daily. 10/21/20   Alfredo Martinez, MD  Potassium 99 MG TABS Take 1 tablet by mouth daily.    [provider]  rOPINIRole (REQUIP) 0.5 MG tablet Take 0.5 mg by mouth at bedtime. 10/02/20   [provider]  senna-docusate (SENOKOT-S) 8.6-50 MG tablet Take 1 tablet by mouth 2 (two) times daily. Hold if having loose or frequent stools. 08/12/22   Pennie Banter, DO  sertraline (ZOLOFT) 50 MG tablet Take 1 tablet (50 mg total) by mouth daily. 08/17/22   Earna Coder, MD  Spacer/Aero-Holding Deretha Emory DEVI 1 each by Does not apply route as needed. 05/10/22   Pennie Banter, DO  tiZANidine (ZANAFLEX) 2 MG tablet Take 1 tablet (2 mg total) by mouth 2 (two) times daily as needed for muscle spasms. 08/12/22   Pennie Banter, DO  vitamin B-12 (CYANOCOBALAMIN) 1000 MCG tablet Take 1,000 mcg by mouth daily. 01/03/20   [provider]  zinc gluconate 50 MG tablet Take 50 mg by mouth daily.    [provider]    Physical Exam: Vitals:   09/01/22 1615 09/01/22 1630 09/01/22 1724 09/01/22 1939  BP:  (!) 120/53 (!) 130/46 (!) 103/49  Pulse: (!) 47 (!) 51 (!) 48 (!) 58  Resp: 13 13 18 18   Temp: (!) 91.8 F (33.2 C) (!) 92 F (33.3 C) (!) 92.2 F (33.4 C) (!) 96 F (35.6 C)  TempSrc:   Axillary Rectal  SpO2: 100% (!) 85% 99% 100%  Weight:       Physical Exam Vitals and nursing note reviewed.  Constitutional:      General: She is not in acute distress.    Appearance: She is normal weight.  HENT:     Head: Normocephalic and atraumatic.     Mouth/Throat:     Mouth: Mucous membranes are dry.     Pharynx: Oropharynx is clear. No oropharyngeal exudate or posterior oropharyngeal erythema.  Eyes:     General: No scleral icterus.    Extraocular Movements: Extraocular movements intact.     Conjunctiva/sclera: Conjunctivae normal.  Cardiovascular:     Rate and Rhythm: Regular rhythm. Bradycardia present.     Heart sounds:  No murmur heard.    No gallop.  Pulmonary:  Effort: Pulmonary effort is normal. No respiratory distress.     Breath sounds: Normal breath sounds. No wheezing, rhonchi or rales.  Abdominal:     General: Bowel sounds are normal. There is no distension.     Palpations: Abdomen is soft.     Tenderness: There is no abdominal tenderness. There is no guarding.  Musculoskeletal:     Cervical back: Neck supple.     Right lower leg: No edema.     Left lower leg: No edema.  Skin:    General: Skin is warm and dry.     Findings: Bruising (Bruising throughout various stages of healing) present.  Neurological:     Mental Status: She is alert and oriented to person, place, and time.     Comments:  Patient is alert and oriented.  No dysarthria or facial asymmetry.   5 out of 5 strength bilateral upper extremities 3 out of 5 strength of bilateral lower extremities  Psychiatric:        Mood and Affect: Mood normal.        Behavior: Behavior normal.    Data Reviewed: CBC with WBC of 12.2, hemoglobin 9.2, MCV 97 and platelets of 267 BMP with sodium of 130, potassium 4.8, bicarb 16, anion gap of 14, BUN 53, creatinine 2.23 with GFR of 21 Troponin negative at 10 Urinalysis with ketonuria, moderate leukocytes, proteinuria, rare bacteria but notably increased squamous epithelial cells.  EKG personally reviewed.  Sinus rhythm with a rate of 49.  Flattening of T waves noted.  US RENAL  Result Date: 09/01/2022 CLINICAL DATA:  [id number] AKI (acute kidney injury) [id number] EXAM: RENAL / URINARY TRACT ULTRASOUND COMPLETE COMPARISON:  11/04/2017 FINDINGS: Right Kidney: Renal measurements: 9.6 x 4.5 x 4.6 cm = volume: 102 mL. Parenchyma echogenic compared to adjacent liver. No focal lesion or hydronephrosis. Left Kidney: Renal measurements: 8.2 x 4.2 x 3.7 cm = volume: 67 mL. No hydronephrosis. Exophytic 3.1 x 2.7 x 2.9 cm cyst from lower pole as before. Bladder: Incompletely distended.  Bilateral ureteral jets  demonstrated. Other: Bilateral pleural effusions incidentally noted. IMPRESSION: 1. No hydronephrosis. 2. Echogenic renal parenchyma suggesting medical renal disease. 3. 3.1 cm left renal cyst. 4. Bilateral pleural effusions. Electronically Signed   By: Corlis Leak M.D.   On: 09/01/2022 18:35   DG Chest Portable 1 View  Result Date: 09/01/2022 CLINICAL DATA:  Recent pneumonia EXAM: PORTABLE CHEST 1 VIEW COMPARISON:  08/07/2022 FINDINGS: Previously noted bibasilar and right lung opacities are improved, with some residual opacities in the mid to lower right lung and lower left lung, which are somewhat linear and may reflect atelectasis or infection. Unchanged cardiac and mediastinal contours. Aortic atherosclerosis. No pleural effusion or pneumothorax. No acute osseous abnormality. Prior ACDF. IMPRESSION: Previously noted bibasilar and right lung opacities are improved, with some residual opacities in the mid to lower right lung and lower left lung, which may reflect atelectasis and/or infection. Electronically Signed   By: Wiliam Ke M.D.   On: 09/01/2022 15:26   CT HEAD WO CONTRAST ( )  Result Date: 09/01/2022 CLINICAL DATA:  Trauma EXAM: CT HEAD WITHOUT CONTRAST TECHNIQUE: Contiguous axial images were obtained from the base of the skull through the vertex without intravenous contrast. RADIATION DOSE REDUCTION: This exam was performed according to the departmental dose-optimization program which includes automated exposure control, adjustment of the mA and/or kV according to patient size and/or use of iterative reconstruction technique. COMPARISON:  None Available. FINDINGS: Brain: No evidence of acute  infarction, hemorrhage, hydrocephalus, extra-axial collection or mass lesion/mass effect. Sequela of moderate chronic microvascular ischemic change. Vascular: No hyperdense vessel or unexpected calcification. Skull: Normal. Negative for fracture or focal lesion. Sinuses/Orbits: No mastoid or middle ear  effusion. Paranasal sinuses are notable for frothy secretions in the bilateral sphenoid sinuses, which can be seen in the setting sinusitis. Bilateral lens replacement. Orbits are otherwise unremarkable. Other: None. IMPRESSION: 1. No acute intracranial abnormality. 2. Frothy secretions in the bilateral sphenoid sinuses can be seen in the setting of acute sinusitis. Electronically Signed   By: Lorenza Cambridge M.D.   On: 09/01/2022 14:36    Results are pending, will review when available.  Assessment and Plan:  * Syncope Patient is presenting with sudden onset syncope this morning when reaching up to grab a bowl in the setting of intermittent dizziness over the last 2 weeks.  No focal neurological deficits on examination to suggest central process.  Given recent echocardiogram with reduced EF of 35-40% that is new, I am concerned for primary cardiac etiology.  Differential also includes orthostatic in the setting of known orthostasis recently in the setting of poor p.o. intake.  Troponin is negative.  Patient is currently on Eliquis, so low suspicion for PE.  - Telemetry monitoring - Echocardiogram ordered to verify reduced EF.  If remains low, could consider cardiology consultation - TSH, BNP and cortisol pending - IV fluids - Orthostatic vital signs  SIRS (systemic inflammatory response syndrome) Presenting with hypothermia, hypotension, and elevated WBC with no clear source of infection.  Urinalysis has bacteria, however unclean catch and no urinary symptoms.  One-time episode of vomiting yesterday but no other abdominal symptoms, no respiratory symptoms.  No new back pain or rashes.  Given patient has a PICC line in for MSSA bacteremia, will obtain blood cultures.   - Blood cultures pending - Broaden antimicrobial therapy to include vancomycin - Continue home cefazolin with dosage adjustment for AKI (End date 09/17/2022) - Narrow pending blood culture results - IR consulted given recent  bacteremia.  - TSH and cortisol as noted above as may be contributory to hypothermia and bradycardia.  AKI (acute kidney injury) Patient has a history of CKD stage a with a history of gradually worsening creatinine.  Per chart review, creatinine was found to be elevated at 1.78 on 4/16.  Currently 2.23.  Potentially due to poor p.o. intake,  - Avoid home nephrotoxic agents - Strict in and out - Daily BMP - Renal ultrasound - IV fluids  History of DVT (deep vein thrombosis) During admission for COVID-19 in December 2023, patient was found to have a nonocclusive thrombus in the posterior tibial vein of the right calf.  Doppler in March 2024 demonstrated persistent DVT presence.  - Continue home Eliquis  MSSA bacteremia Please see above for management of SIRS.  For now continue home cefazolin with renal adjustment  Rheumatoid arthritis - Continue home Plaquenil  Hypothyroidism - Continue home levothyroxine  Multiple myeloma Previously treated with daratumumab with plans to start Revlimid, but had to be placed on hold due to multiple admissions since December 2023.  - Continue outpatient follow-up with oncology  Palpitations - Restart home atenolol tomorrow if blood pressure remains stable and heart rate improves with resolution of hypothermia  Advance Care Planning:   Code Status: Full Code verified by patient with her granddaughter at bedside.  They would like to have further goals of care discussion though and palliative consultation is requested.  Consults: Palliative care  Family  Communication: Patient's granddaughter updated at bedside  Severity of Illness: The appropriate patient status for this patient is INPATIENT. Inpatient status is judged to be reasonable and necessary in order to provide the required intensity of service to ensure the patient's safety. The patient's presenting symptoms, physical exam findings, and initial radiographic and laboratory data in the  context of their chronic comorbidities is felt to place them at high risk for further clinical deterioration. Furthermore, it is not anticipated that the patient will be medically stable for discharge from the hospital within 2 midnights of admission.   * I certify that at the point of admission it is my clinical judgment that the patient will require inpatient hospital care spanning beyond 2 midnights from the point of admission due to high intensity of service, high risk for further deterioration and high frequency of surveillance required.*  Author: Verdene Lennert, MD 09/01/2022 8:49 PM  For on call review [email or web address].

## 2022-09-01 NOTE — Consult Note (Signed)
Pharmacy Antibiotic Note  Sydney Gillespie is a 82 y.o. female admitted on 09/01/2022 with bacteremia and sepsis.  Pharmacy has been consulted for vancomycin dosing. Pt has a hx of MSSA bacteremia. On ancef until 09/17/22.   Plan: Pt's Scr is elevated from baseline. Will order vancomycin 1250 mg x 1 loading dose. Will f/u with Scr in the AM and if still elevated and if plan is to continue vancomycin will order a random level and dose by levels.   Continue ancef 2 g q12H. Renally adjusted.   Weight: 55 kg (121 lb 4.1 oz)  Temp (24hrs), Avg:91.6 F (33.1 C), Min:91.6 F (33.1 C), Max:91.7 F (33.2 C)  Recent Labs  Lab 09/01/22 1300 09/01/22 1547  WBC 12.2*  --   CREATININE 2.23*  --   LATICACIDVEN  --  0.7    Estimated Creatinine Clearance: 16.9 mL/min (A) (by C-G formula based on SCr of 2.23 mg/dL (H)).    Allergies  Allergen Reactions   Isoniazid Other (See Comments)    Blisters     Antimicrobials this admission: PTA on ancef 2 g q8H 4/23 vancomycin >>   Dose adjustments this admission: None  Microbiology results: None.   Thank you for allowing pharmacy to be a part of this patient's care.  Ronnald Ramp, PharmD, BCPS 09/01/2022 4:41 PM

## 2022-09-01 NOTE — Assessment & Plan Note (Addendum)
Acute kidney injury on CKD stage IIIb.  Will continue gentle IV fluid hydration.  Creatinine 2.23 on presentation.  Creatinine down to 1.71.  Baseline creatinine about 1.3.

## 2022-09-01 NOTE — ED Provider Notes (Signed)
[facility] Provider Note    Event Date/Time   First MD Initiated Contact with Patient 09/01/22 1359     (approximate)  History   Chief Complaint: Near Syncope  HPI  Sydney Gillespie is a 82 y.o. 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  According to the daughter over the past week or so the patient has deteriorated at home has become more weak and has had several falls.  Describes more of a generalized weakness.  Patient is on Eliquis for prior blood clot.  Patient denies hitting her head, denies any chest pain shortness of breath.  Denies any fever cough or congestion.  Physical Exam   Triage Vital Signs: ED Triage Vitals  Enc Vitals Group     BP 09/01/22 1300 (!) 97/48     Pulse Rate 09/01/22 1300 (!) 48     Resp 09/01/22 1300 19     Temp --      Temp src --      SpO2 09/01/22 1300 100 %     Weight --      Height --      Head Circumference --      Peak Flow --      Pain Score 09/01/22 1259 0     Pain Loc --      Pain Edu? --      Excl. in GC? --     Most recent vital signs: Vitals:   09/01/22 1300  BP: (!) 97/48  Pulse: (!) 48  Resp: 19  SpO2: 100%    General: Awake, no distress.  CV:  Good peripheral perfusion.  Regular rate and rhythm  Resp:  Normal effort.  Equal breath sounds bilaterally.  Abd:  No distention.  Soft, nontender.  No rebound or guarding. Other:  Right upper extremity PICC line   ED Results / Procedures / Treatments   EKG  EKG viewed and interpreted by myself shows sinus bradycardia at 49 bpm with a narrow QRS, left axis deviation, largely normal intervals with nonspecific ST changes.  RADIOLOGY  I have reviewed and interpreted the CT head images.  No large bleed seen on my evaluation. Radiology is read the CT scan is  negative for acute intracranial abnormality.   MEDICATIONS ORDERED IN ED: Medications  0.9 %  sodium chloride infusion (has no administration in time range)     IMPRESSION / MDM / ASSESSMENT AND PLAN / ED COURSE  I reviewed the triage vital signs and the nursing notes.  Patient's presentation is most consistent with acute presentation with potential threat to life or bodily function.  Patient presents to the emergency department for generalized weakness and several falls/near syncopal episodes over the past 1 week.  Patient is currently on IV antibiotics for spinal osteomyelitis via PICC line at home.  I reviewed the patient's discharge summary from 08/12/2022 patient had discitis/osteomyelitis diagnosed by MRI 07/01/2022.  Blood cultures have been negative.  Patient found to have pneumonia, continued on IV antibiotics (cefazolin).  Anemia determined to be iron deficiency induced.  Nonocclusive DVT currently being treated with Eliquis.  Patient's workup in the emergency department today shows a creatinine of 2.23 from a baseline closer to 1.1.  Patient's weakness could very likely be due to her acute renal insufficiency and dehydration.  Will start [facility]  patient on normal saline infusion.  CBC shows no significant findings besides anemia which appears largely unchanged from historical values.  Troponin reassuringly is negative.  Given the patient's falls on Eliquis we will obtain a CT scan of the head which is negative.  EKG somewhat bradycardic around 50 bpm but no concerning findings on EKG.  Given the patient's worsening weakness over the past 1 week with 3 falls and acute kidney insufficiency we will admit to the hospital service.  Patient and daughter are agreeable to this plan.  Patient is rectal temperature has resulted at 91 degrees.  Given the patient's slight leukocytosis with known infection on chronic antibiotics with worsening weakness now with hypothermia meeting sepsis criteria we will  start the patient on broader antibiotics in addition to the IV cefazolin that the patient has been on we will also add Flagyl and vancomycin.  We will send blood cultures, urine culture.  We will continue to IV hydrate.  Patient to be admitted to the hospitalist service.  CRITICAL CARE Performed by: Minna Antis   Total critical care time: 30 minutes  Critical care time was exclusive of separately billable procedures and treating other patients.  Critical care was necessary to treat or prevent imminent or life-threatening deterioration.  Critical care was time spent personally by me on the following activities: development of treatment plan with patient and/or surrogate as well as nursing, discussions with consultants, evaluation of patient's response to treatment, examination of patient, obtaining history from patient or surrogate, ordering and performing treatments and interventions, ordering and review of laboratory studies, ordering and review of radiographic studies, pulse oximetry and re-evaluation of patient's condition.   FINAL CLINICAL IMPRESSION(S) / ED DIAGNOSES   Sepsis Weakness Acute kidney injury    Note:  This document was prepared using Dragon voice recognition software and may include unintentional dictation errors.   Minna Antis, MD 09/01/22 [phone]

## 2022-09-01 NOTE — ED Notes (Signed)
Dr. Lenard Lance notified of pt's rectal temp is 91.7, baer hugger applied. Rectal temp probe inserted.

## 2022-09-01 NOTE — Assessment & Plan Note (Addendum)
Previously treated with daratumumab with plans to start Revlimid, but had to be placed on hold due to multiple admissions since December 2023.  - Continue outpatient follow-up with oncology

## 2022-09-01 NOTE — Sepsis Progress Note (Signed)
Notified bedside nurse of need to draw and administer antibiotics, lactic acid, blood cultures, and fluid bolus.   

## 2022-09-01 NOTE — Assessment & Plan Note (Signed)
-   Continue home Plaquenil 

## 2022-09-01 NOTE — ED Triage Notes (Signed)
Pt comes via EMs from home with c/o loc. Pt denies hitting head. Pt is on thinners.  BP-80 at first  Pt states dizziness when standing and better when sitting down.  114/55 last BP reading Pt is on rate controlled meds, HR 48 100% RA PICC line in right arm CBG-87  Pt receiving antibiotics for staph infection.

## 2022-09-01 NOTE — Progress Notes (Signed)
OT Cancellation Note  Patient Details Name: Sydney Gillespie MRN: [id number] DOB: 1940/12/28   Cancelled Treatment:    Reason Eval/Treat Not Completed: Medical issues which prohibited therapy. Consult received, chart reviewed. Pt noted with HR in 40's. Additional work up pending. Will hold OT evaluation at this time and re-attempt at later date/time as medically appropriate.   Arman Filter., MPH, MS, OTR/L ascom [phone] 09/01/22, 4:23 PM

## 2022-09-01 NOTE — Assessment & Plan Note (Signed)
During admission for COVID-19 in December 2023, patient was found to have a nonocclusive thrombus in the posterior tibial vein of the right calf.  Doppler in March 2024 demonstrated persistent DVT presence.  - Continue home Eliquis

## 2022-09-01 NOTE — Assessment & Plan Note (Addendum)
Secondary to orthostatic hypotension 

## 2022-09-01 NOTE — Code Documentation (Signed)
CODE SEPSIS - PHARMACY COMMUNICATION  **Broad Spectrum Antibiotics should be administered within 1 hour of Sepsis diagnosis**  Time Code Sepsis Called/Page Received: 1513  Antibiotics Ordered: cefazolin, metronidazole, vancomycin  Time of 1st antibiotic administration: 1618  Additional action taken by pharmacy: N/A    Manfred Shirts ,PharmD Clinical Pharmacist  09/01/2022  5:01 PM

## 2022-09-01 NOTE — Assessment & Plan Note (Addendum)
Presenting with hypothermia, hypotension, and elevated WBC.  Continue cefazolin as per ID.  So far blood cultures negative for 5 days.

## 2022-09-01 NOTE — Assessment & Plan Note (Addendum)
Continue to monitor

## 2022-09-02 ENCOUNTER — Inpatient Hospital Stay: Payer: Medicare Other

## 2022-09-02 DIAGNOSIS — M4644 Discitis, unspecified, thoracic region: Secondary | ICD-10-CM | POA: Diagnosis not present

## 2022-09-02 DIAGNOSIS — Z86718 Personal history of other venous thrombosis and embolism: Secondary | ICD-10-CM

## 2022-09-02 DIAGNOSIS — D638 Anemia in other chronic diseases classified elsewhere: Secondary | ICD-10-CM | POA: Insufficient documentation

## 2022-09-02 DIAGNOSIS — N179 Acute kidney failure, unspecified: Secondary | ICD-10-CM | POA: Diagnosis not present

## 2022-09-02 DIAGNOSIS — M4624 Osteomyelitis of vertebra, thoracic region: Secondary | ICD-10-CM | POA: Diagnosis not present

## 2022-09-02 DIAGNOSIS — B9561 Methicillin susceptible Staphylococcus aureus infection as the cause of diseases classified elsewhere: Secondary | ICD-10-CM | POA: Diagnosis not present

## 2022-09-02 DIAGNOSIS — R55 Syncope and collapse: Secondary | ICD-10-CM | POA: Diagnosis not present

## 2022-09-02 DIAGNOSIS — Z79899 Other long term (current) drug therapy: Secondary | ICD-10-CM | POA: Diagnosis not present

## 2022-09-02 DIAGNOSIS — R5383 Other fatigue: Secondary | ICD-10-CM

## 2022-09-02 DIAGNOSIS — R002 Palpitations: Secondary | ICD-10-CM

## 2022-09-02 DIAGNOSIS — R7881 Bacteremia: Secondary | ICD-10-CM | POA: Diagnosis not present

## 2022-09-02 LAB — CBC WITH DIFFERENTIAL/PLATELET
Abs Immature Granulocytes: 0.23 10*3/uL — ABNORMAL HIGH (ref 0.00–0.07)
Basophils Absolute: 0 10*3/uL (ref 0.0–0.1)
Basophils Relative: 0 %
Eosinophils Absolute: 0 10*3/uL (ref 0.0–0.5)
Eosinophils Relative: 0 %
HCT: 25.4 % — ABNORMAL LOW (ref 36.0–46.0)
Hemoglobin: 7.9 g/dL — ABNORMAL LOW (ref 12.0–15.0)
Immature Granulocytes: 2 %
Lymphocytes Relative: 16 %
Lymphs Abs: 1.9 10*3/uL (ref 0.7–4.0)
MCH: 30 pg (ref 26.0–34.0)
MCHC: 31.1 g/dL (ref 30.0–36.0)
MCV: 96.6 fL (ref 80.0–100.0)
Monocytes Absolute: 1 10*3/uL (ref 0.1–1.0)
Monocytes Relative: 8 %
Neutro Abs: 9.1 10*3/uL — ABNORMAL HIGH (ref 1.7–7.7)
Neutrophils Relative %: 74 %
Platelets: 240 10*3/uL (ref 150–400)
RBC: 2.63 MIL/uL — ABNORMAL LOW (ref 3.87–5.11)
RDW: 18.5 % — ABNORMAL HIGH (ref 11.5–15.5)
WBC: 12.2 10*3/uL — ABNORMAL HIGH (ref 4.0–10.5)
nRBC: 0 % (ref 0.0–0.2)

## 2022-09-02 LAB — BASIC METABOLIC PANEL
Anion gap: 14 (ref 5–15)
BUN: 50 mg/dL — ABNORMAL HIGH (ref 8–23)
CO2: 14 mmol/L — ABNORMAL LOW (ref 22–32)
Calcium: 8.5 mg/dL — ABNORMAL LOW (ref 8.9–10.3)
Chloride: 104 mmol/L (ref 98–111)
Creatinine, Ser: 2.13 mg/dL — ABNORMAL HIGH (ref 0.44–1.00)
GFR, Estimated: 23 mL/min — ABNORMAL LOW (ref >60)
Glucose, Bld: 52 mg/dL — ABNORMAL LOW (ref 70–99)
Potassium: 4.9 mmol/L (ref 3.5–5.1)
Sodium: 132 mmol/L — ABNORMAL LOW (ref 135–145)

## 2022-09-02 LAB — ECHOCARDIOGRAM LIMITED
Area-P 1/2: 2.95 cm2
P 1/2 time: 572 msec
S' Lateral: 3 cm
Weight: 1940.05 oz

## 2022-09-02 LAB — CULTURE, BLOOD (ROUTINE X 2)

## 2022-09-02 LAB — CREATININE, URINE, RANDOM: Creatinine, Urine: 98 mg/dL

## 2022-09-02 LAB — CORTISOL: Cortisol, Plasma: 19.3 ug/dL

## 2022-09-02 LAB — GLUCOSE, CAPILLARY
Glucose-Capillary: 44 mg/dL — CL (ref 70–99)
Glucose-Capillary: 56 mg/dL — ABNORMAL LOW (ref 70–99)
Glucose-Capillary: 95 mg/dL (ref 70–99)
Glucose-Capillary: 94 mg/dL (ref 70–99)
Glucose-Capillary: 148 mg/dL — ABNORMAL HIGH (ref 70–99)

## 2022-09-02 LAB — SODIUM, URINE, RANDOM: Sodium, Ur: 61 mmol/L

## 2022-09-02 LAB — CORTISOL-AM, BLOOD: Cortisol - AM: 20.8 ug/dL (ref 6.7–22.6)

## 2022-09-02 MED ORDER — SODIUM CHLORIDE 0.9% FLUSH
10.0000 mL | Freq: Two times a day (BID) | INTRAVENOUS | Status: DC
  Administered 2022-09-02 – 2022-09-07 (×10): 10 mL

## 2022-09-02 MED ORDER — POLYETHYLENE GLYCOL 3350 17 G PO PACK
17.0000 g | PACK | Freq: Every day | ORAL | Status: DC
  Administered 2022-09-02 – 2022-09-07 (×6): 17 g via ORAL
  Filled 2022-09-02 (×6): qty 1

## 2022-09-02 MED ORDER — LACTATED RINGERS IV BOLUS
500.0000 mL | Freq: Once | INTRAVENOUS | Status: AC
  Administered 2022-09-02: 500 mL via INTRAVENOUS

## 2022-09-02 MED ORDER — OLANZAPINE 5 MG PO TABS
5.0000 mg | ORAL_TABLET | Freq: Every day | ORAL | Status: DC
  Administered 2022-09-02: 5 mg via ORAL
  Filled 2022-09-02: qty 1

## 2022-09-02 MED ORDER — ROPINIROLE HCL 0.25 MG PO TABS
0.2500 mg | ORAL_TABLET | Freq: Every day | ORAL | Status: DC
  Administered 2022-09-02 – 2022-09-06 (×5): 0.25 mg via ORAL
  Filled 2022-09-02 (×5): qty 1

## 2022-09-02 MED ORDER — SERTRALINE HCL 50 MG PO TABS
50.0000 mg | ORAL_TABLET | Freq: Every evening | ORAL | Status: DC
  Administered 2022-09-03 – 2022-09-06 (×4): 50 mg via ORAL
  Filled 2022-09-02 (×4): qty 1

## 2022-09-02 MED ORDER — LEVOTHYROXINE SODIUM 50 MCG PO TABS
125.0000 ug | ORAL_TABLET | Freq: Every day | ORAL | Status: DC
  Administered 2022-09-03 – 2022-09-04 (×2): 125 ug via ORAL
  Filled 2022-09-02 (×2): qty 1

## 2022-09-02 MED ORDER — DEXTROSE-NACL 5-0.9 % IV SOLN: INTRAVENOUS | Status: DC

## 2022-09-02 MED ORDER — GLUCOSE 40 % PO GEL
1.0000 | Freq: Once | ORAL | Status: AC
  Administered 2022-09-02: 31 g via ORAL
  Filled 2022-09-02: qty 1.21

## 2022-09-02 NOTE — Progress Notes (Signed)
OT Cancellation Note  Patient Details Name: Sydney Gillespie MRN: [id number] DOB: 07/21/1940   Cancelled Treatment:    Reason Eval/Treat Not Completed: Fatigue/lethargy limiting ability to participate;Other (comment). OT order received, chart reviewed. Upon initial attempt, pt lethargic, does not wake to verbal/tactile stimuli. Re-attempted later in the AM and nsg indicates pt still lethargic, suggests hold at this time. Will hold and re-attempt at a later date/time as available and pt medically appropriate for OT services.   Rockney Ghee, M.S., OTR/L 09/02/22, 11:05 AM

## 2022-09-02 NOTE — Assessment & Plan Note (Addendum)
With ferritin up at 377.  Responded to blood transfusion for hemoglobin of 7.2.  Last hemoglobin 9.3.

## 2022-09-02 NOTE — Evaluation (Signed)
Physical Therapy Evaluation Patient Details Name: Sydney Gillespie MRN: [id number] DOB: 08/26/1940 Today's Date: 09/02/2022  History of Present Illness  82 year old female past medical history of recent MSSA bacteremia on cefazolin, multiple myeloma, chronic kidney disease stage IIIb, compression fracture status post kyphoplasty, hypertension, hyperlipidemia, rheumatoid arthritis presented with near syncope.   Clinical Impression  Patient received seated on side of bed with OT and family in room. Patient assisted to bedside commode with +2 mod assist. She has poor balance, but was able to stand at edge of bed x3 reps with assistance. Patient lethargic. She requires min A to return to supine from sitting. Patient will continue to benefit from skilled PT to improve functional independence, safety and strength.      Recommendations for follow up therapy are one component of a multi-disciplinary discharge planning process, led by the attending physician.  Recommendations may be updated based on patient status, additional functional criteria and insurance authorization.  Follow Up Recommendations Can patient physically be transported by private vehicle: No     Assistance Recommended at Discharge Frequent or constant Supervision/Assistance  Patient can return home with the following  A lot of help with walking and/or transfers;A lot of help with bathing/dressing/bathroom    Equipment Recommendations None recommended by PT (TBD)  Recommendations for Other Services       Functional Status Assessment Patient has had a recent decline in their functional status and demonstrates the ability to make significant improvements in function in a reasonable and predictable amount of time.     Precautions / Restrictions Precautions Precautions: Fall Restrictions Weight Bearing Restrictions: No      Mobility  Bed Mobility Overal bed mobility: Needs Assistance Bed Mobility: Supine to Sit, Sit to  Supine     Supine to sit: Mod assist Sit to supine: Mod assist        Transfers Overall transfer level: Needs assistance Equipment used: 2 person hand held assist Transfers: Sit to/from Stand, Bed to chair/wheelchair/BSC Sit to Stand: Mod assist, +2 physical assistance Stand pivot transfers: Mod assist, +2 physical assistance, From elevated surface         General transfer comment: STS x 3 reps    Ambulation/Gait               General Gait Details: unable/unsafe at this time  Stairs            Wheelchair Mobility    Modified Rankin (Stroke Patients Only)       Balance Overall balance assessment: Needs assistance Sitting-balance support: Feet supported Sitting balance-Leahy Scale: Fair     Standing balance support: Bilateral upper extremity supported, During functional activity, Reliant on assistive device for balance Standing balance-Leahy Scale: Poor                               Pertinent Vitals/Pain      Home Living Family/patient expects to be discharged to:: Private residence Living Arrangements: Other relatives Engineer, building services) Available Help at Discharge: Family;Available PRN/intermittently Type of Home: House Home Access: Stairs to enter Entrance Stairs-Rails: Right;Left;Can reach both Entrance Stairs-Number of Steps: 5   Home Layout: One level Home Equipment: Shower seat - built in;Rollator (4 wheels)      Prior Function Prior Level of Function : Independent/Modified Independent             Mobility Comments: Uses 4WW for mobility, 7 falls in last month, per family primarily due  to her vertigo. ADLs Comments: Requires assistance from family for LB dressing, bathing (sponge bathes), and IADL managent.     Hand Dominance   Dominant Hand: Right    Extremity/Trunk Assessment   Upper Extremity Assessment Upper Extremity Assessment: Defer to OT evaluation    Lower Extremity Assessment Lower Extremity  Assessment: Generalized weakness    Cervical / Trunk Assessment Cervical / Trunk Assessment: Other exceptions Cervical / Trunk Exceptions: T8-10 kypho 07/29/22  Communication   Communication: No difficulties  Cognition Arousal/Alertness: Lethargic Behavior During Therapy: Flat affect Overall Cognitive Status: Impaired/Different from baseline Area of Impairment: Safety/judgement, Awareness, Problem solving                             Problem Solving: Slow processing, Requires verbal cues, Requires tactile cues, Decreased initiation General Comments: Patient is generally quite lethargic in the am per granddaughter. PM is better for her        General Comments      Exercises     Assessment/Plan    PT Assessment Patient needs continued PT services  PT Problem List Decreased strength;Decreased activity tolerance;Decreased balance;Decreased mobility;Decreased safety awareness;Decreased cognition       PT Treatment Interventions DME instruction;Gait training;Stair training;Functional mobility training;Therapeutic activities;Patient/family education;Therapeutic exercise;Balance training;Neuromuscular re-education;Cognitive remediation    PT Goals (Current goals can be found in the Care Plan section)  Acute Rehab PT Goals Patient Stated Goal: none stated PT Goal Formulation: Patient unable to participate in goal setting Time For Goal Achievement: 09/08/22    Frequency Min 3X/week     Co-evaluation               AM-PAC PT "6 Clicks" Mobility  Outcome Measure Help needed turning from your back to your side while in a flat bed without using bedrails?: A Lot Help needed moving from lying on your back to sitting on the side of a flat bed without using bedrails?: A Lot Help needed moving to and from a bed to a chair (including a wheelchair)?: A Lot Help needed standing up from a chair using your arms (e.g., wheelchair or bedside chair)?: A Lot Help needed to walk  in hospital room?: Total Help needed climbing 3-5 steps with a railing? : Total 6 Click Score: 10    End of Session   Activity Tolerance: Patient limited by fatigue Patient left: in bed;with call bell/phone within reach;with bed alarm set;with family/visitor present Nurse Communication: Mobility status PT Visit Diagnosis: Muscle weakness (generalized) (M62.81);Other abnormalities of gait and mobility (R26.89);Difficulty in walking, not elsewhere classified (R26.2);Unsteadiness on feet (R26.81);Repeated falls (R29.6)    Time: 1500-1508 PT Time Calculation (min) (ACUTE ONLY): 8 min   Charges:   PT Evaluation $PT Eval Moderate Complexity: 1 Mod          .kc

## 2022-09-02 NOTE — Plan of Care (Signed)
GOC consult noted. Patient has been evaluated by SLP, and has worked with PT/OT this afternoon. She is sleeping soundly at this time. Will rettempt tomorrow and reach out to granddaughter.

## 2022-09-02 NOTE — Hospital Course (Addendum)
82 year old female past medical history of recent MSSA bacteremia on cefazolin, multiple myeloma, chronic kidney disease stage IIIb, compression fracture status post kyphoplasty, hypertension, hyperlipidemia, rheumatoid arthritis presented with near syncope.  Sydney Gillespie states that she had been up and walking around this morning when she reached up to grab something from a cabinet and started seeing spots. She subsequently lost consciousness. Sydney Gillespie denies knowingly hitting her head. A home health aide found her on the ground and EMS was called. Patient's granddaughter at bedside states that over the last several weeks, patient has had intermittent dizziness that has been difficult for them to discern from her usual vertigo. She denies any prior loss of consciousness over the last few weeks. Sydney Gillespie also endorses frequent falls but states that it is usually due to bilateral lower extremity weakness that is chronic and unchanged. She denies any known fevers, chills, abdominal pain, diarrhea, shortness of breath, cough, chest pain or palpitations. She notes one-time episode of nausea with vomiting yesterday with no further episodes of nausea afterwards.   4/24.  Patient lethargic this morning.  MRI of the brain negative for acute intracranial abnormality 4/25.  Patient's mental status much improved.  With hemoglobin dropping down to 7.2 we will give a unit of packed red blood cells.  Continue hydration for acute kidney injury.  Sodium bicarb tablets added. 4/26.  Hemoglobin responded to blood transfusion with hemoglobin up at 9.1.  Sodium bicarb still low at 16 and will increase sodium bicarb tablets to 3 times daily dosing.  Patient orthostatic today so midodrine added.  Patient again had low sugar this morning. 4/27.  CO2 down to 13.  Change IV fluids over to sodium bicarb drip.  Discontinue bicarb tablets.  Metabolic acidosis could be secondary to acute kidney injury and then treatment with  saline. 4/28.  CO2 up to 17 and creatinine down to 1.33.  Continue bicarb drip today. 4/29.  CO2 up to 24 and creatinine down to 1.2.  Hemoglobin 8.5.  Patient feeling better.  Stable for discharge out to rehab.  Bicarbonate drip discontinued early morning.

## 2022-09-02 NOTE — Progress Notes (Incomplete)
[facility] Infectious Diseases Follow Up Note  Patient Identification: Patient Name: Sydney Gillespie MRN: [id number] Admit Date: 09/01/2022  1:53 PM Age: 82 y.o.Today's Date: 09/02/2022  Reason for Visit: bacteremia   Principal Problem:   Lethargy Active Problems:   Palpitations   Syncope   Multiple myeloma   Hypothyroidism   Rheumatoid arthritis   MSSA bacteremia   AKI (acute kidney injury)   SIRS (systemic inflammatory response syndrome)   History of DVT (deep vein thrombosis)   Anemia of chronic disease   Current Antibiotics:  Cefazolin   Lines/Hardwares: bladder stimulator + per grand daughter   Interval Events: afebrile, MRI brain has been ordered due to lethargy to r/o CVA due to lethargy thiis morning ( noted to have low BG).    Assessment 82 y.o. 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   Fall/Syncope - thought to be orthostatic, medication induced, dehydration vs combination  Lethargy - she was relatively more awake, alert and oriented on my exam this afternoon per grand daughter at bedside. MRI brain 4/24 negative for acute abnormality  MSSA bacteremia complicated with thoracic discitis and osteomyelitis - TEE was not done due to plan for 6 weeks IV course   Recommendations Continue IV cefazolin as planned until 09/17/22 Patient has an  appt on 5/9 at 10: 30 am with Dr Rivka Safer, can assess for need for PO switch at that visit.  Monitor CBC and CMP  Management of syncope per primary  ID wil so for now, please recall if needed.   Rest of the management as per the primary team. Thank you for the consult. Please page with pertinent questions or concerns.  ______________________________________________________________________ Subjective patient seen and examined at the bedside. Son and grand daughter at bedside.   Past Medical History:  Diagnosis Date   Acute respiratory failure with hypoxia 05/07/2022   Acute respiratory failure with hypoxia 05/07/2022   Allergic rhinitis    Allergy    Anemia    Anemia    Barrett's esophagus    Blood dyscrasia    multiple myloma remission   Cancer    Change in bowel habits    Compression fracture 2013   Degenerative disc disease, lumbar    Dysrhythmia    palpitations   Esophageal reflux    Family history of adverse reaction to anesthesia    nausea -mom   GERD (gastroesophageal reflux disease)    Gout    H/O bone marrow transplant    Heart palpitations    Hyperlipidemia    Hypothyroidism    Inflammatory polyarthropathy    Lumbago    Lumbar radiculitis    Lumbar stenosis with neurogenic claudication    Multiple myeloma    Multiple myeloma    Neuropathy    Bilateral Feet   Osteopenia    Osteoporosis    Other dysphagia    Palpitations    Pneumonia 04/2022   R/T COVID   Positive PPD    Renal insufficiency  Rheumatoid arthritis    Rheumatoid arthritis    Seasonal allergies    Tuberculosis 2011   Vertigo    Past Surgical History:  Procedure Laterality Date   ABDOMINAL HYSTERECTOMY     APPENDECTOMY     BACK SURGERY     BREAST EXCISIONAL BIOPSY Bilateral    neg   CATARACT EXTRACTION Bilateral    CHOLECYSTECTOMY     COLONOSCOPY WITH PROPOFOL N/A 11/12/2016   Procedure: COLONOSCOPY WITH PROPOFOL;  Surgeon: Scot Jun, MD;  Location: [facility] ENDOSCOPY;   Service: Endoscopy;  Laterality: N/A;   ESOPHAGOGASTRODUODENOSCOPY (EGD) WITH PROPOFOL N/A 11/12/2016   Procedure: ESOPHAGOGASTRODUODENOSCOPY (EGD) WITH PROPOFOL;  Surgeon: Scot Jun, MD;  Location: [facility] ENDOSCOPY;  Service: Endoscopy;  Laterality: N/A;   ESOPHAGOGASTRODUODENOSCOPY (EGD) WITH PROPOFOL N/A 07/17/2022   Procedure: ESOPHAGOGASTRODUODENOSCOPY (EGD) WITH PROPOFOL;  Surgeon: Regis Bill, MD;  Location: [facility] ENDOSCOPY;  Service: Endoscopy;  Laterality: N/A;   FOOT SURGERY     KYPHOPLASTY N/A 07/29/2022   Procedure: KYPHOPLASTY THORACIC EIGHT, THORACIC NINE, THORACIC TEN;  Surgeon: Tressie Stalker, MD;  Location: [facility] OR;  Service: Neurosurgery;  Laterality: N/A;  3C   LUMBAR LAMINECTOMY/DECOMPRESSION MICRODISCECTOMY Left 06/18/2016   Procedure: LAMINECTOMY FOR FACET/SYNOVIAL CYST LUMBAR THREE - LUMBAR FOUR LEFT;  Surgeon: Tressie Stalker, MD;  Location: [facility] OR;  Service: Neurosurgery;  Laterality: Left;  LAMINECTOMY FOR FACET/SYNOVIAL CYST LUMBAR THREE - LUMBAR FOUR LEFT   Vitals BP (!) 120/59 (BP Location: Left Arm)   Pulse 70   Temp 98.3 F (36.8 C) (Oral)   Resp 16   Wt 60.1 kg   SpO2 99%   BMI 22.05 kg/m     Physical Exam Constitutional:  elderly female sitting in the bed and not in distress    Comments: appears stated age  Cardiovascular:     Rate and Rhythm: Normal rate and regular rhythm.     Heart sounds: s1 and s2  Pulmonary:     Effort: Pulmonary effort is normal.    Comments: Normal breath sounds   Abdominal:     Palpations: Abdomen is soft.     Tenderness: non distended and non tender   Musculoskeletal:        General: No swelling or tenderness in peripheral joints or signs of septic joint   Skin:    Comments: chronic healed scattered wounds in lower legs b/l, bruises in b/l feet. RT arm PICC OK, site of previous kyphoplasty with no concerns  Neurological:     General: awake, alert, oriented to name, place, person , follows commands but  unclear events ending up needing admission  Psychiatric:        Mood and Affect: Mood normal.   Pertinent Microbiology Results for orders placed or performed during the hospital encounter of 09/01/22  Resp panel by RT-PCR (RSV, Flu A&B, Covid) Anterior Nasal Swab     Status: None   Collection Time: 09/01/22  3:12 PM   Specimen: Anterior Nasal Swab  Result Value Ref Range Status   SARS Coronavirus 2 by RT PCR NEGATIVE NEGATIVE Final    Comment: (NOTE) SARS-CoV-2 target nucleic acids are NOT DETECTED.  The SARS-CoV-2 RNA is generally detectable in upper respiratory specimens during the acute phase of infection. The lowest concentration of SARS-CoV-2 viral copies this assay can detect is 138 copies/mL. A negative result does not preclude SARS-Cov-2 infection and should not be used as the sole basis for treatment or other patient management decisions. A negative result may occur  with  improper specimen collection/handling, submission of specimen other than nasopharyngeal swab, presence of viral mutation(s) within the areas targeted by this assay, and inadequate number of viral copies(<138 copies/mL). A negative result must be combined with clinical observations, patient history, and epidemiological information. The expected result is Negative.  Fact Sheet for Patients:  BloggerCourse.com  Fact Sheet for Healthcare Providers:  SeriousBroker.it  This test is no t yet approved or cleared by the [place] FDA and  has been authorized for detection and/or diagnosis of SARS-CoV-2 by FDA under an Emergency Use Authorization (EUA). This EUA will remain  in effect (meaning this test can be used) for the duration of the COVID-19 declaration under Section 564(b)(1) of the Act, 21 U.S.C.section 360bbb-3(b)(1), unless the authorization is terminated  or revoked sooner.       Influenza A by PCR NEGATIVE NEGATIVE Final   Influenza B by  PCR NEGATIVE NEGATIVE Final    Comment: (NOTE) The Xpert Xpress SARS-CoV-2/FLU/RSV plus assay is intended as an aid in the diagnosis of influenza from Nasopharyngeal swab specimens and should not be used as a sole basis for treatment. Nasal washings and aspirates are unacceptable for Xpert Xpress SARS-CoV-2/FLU/RSV testing.  Fact Sheet for Patients: BloggerCourse.com  Fact Sheet for Healthcare Providers: SeriousBroker.it  This test is not yet approved or cleared by the [place] FDA and has been authorized for detection and/or diagnosis of SARS-CoV-2 by FDA under an Emergency Use Authorization (EUA). This EUA will remain in effect (meaning this test can be used) for the duration of the COVID-19 declaration under Section 564(b)(1) of the Act, 21 U.S.C. section 360bbb-3(b)(1), unless the authorization is terminated or revoked.     Resp Syncytial Virus by PCR NEGATIVE NEGATIVE Final    Comment: (NOTE) Fact Sheet for Patients: BloggerCourse.com  Fact Sheet for Healthcare Providers: SeriousBroker.it  This test is not yet approved or cleared by the [place] FDA and has been authorized for detection and/or diagnosis of SARS-CoV-2 by FDA under an Emergency Use Authorization (EUA). This EUA will remain in effect (meaning this test can be used) for the duration of the COVID-19 declaration under Section 564(b)(1) of the Act, 21 U.S.C. section 360bbb-3(b)(1), unless the authorization is terminated or revoked.  Performed at [facility], [address] [place], [place] [address]   Blood Culture (routine x 2)     Status: None (Preliminary result)   Collection Time: 09/01/22  3:47 PM   Specimen: BLOOD  Result Value Ref Range Status   Specimen Description BLOOD LEFT ANTECUBITAL  Final   Special Requests   Final    BOTTLES DRAWN AEROBIC AND ANAEROBIC Blood Culture  results may not be optimal due to an excessive volume of blood received in culture bottles   Culture   Final    NO GROWTH < 24 HOURS Performed at [facility], [address] [place], [place] [address]    Report Status PENDING  Incomplete  Blood Culture (routine x 2)     Status: None (Preliminary result)   Collection Time: 09/01/22  3:47 PM   Specimen: BLOOD  Result Value Ref Range Status   Specimen Description BLOOD BLOOD LEFT FOREARM  Final   Special Requests   Final    BOTTLES DRAWN AEROBIC AND ANAEROBIC Blood Culture results may not be optimal due to an inadequate volume of blood received in culture bottles   Culture   Final    NO GROWTH < 24 HOURS Performed at [facility]  [facility], [address], [place], [place] [address]    Report Status PENDING  Incomplete   Pertinent Lab.    Latest Ref Rng & Units 09/02/2022    5:15 AM 09/01/2022    1:00 PM 08/17/2022   10:31 AM  CBC  WBC 4.0 - 10.5 K/uL 12.2  12.2  8.9   Hemoglobin 12.0 - 15.0 g/dL 7.9  9.2  60.4   Hematocrit 36.0 - 46.0 % 25.4  29.4  35.0   Platelets 150 - 400 K/uL 240  267  313       Latest Ref Rng & Units 09/02/2022    5:15 AM 09/01/2022    1:00 PM 08/17/2022   10:31 AM  CMP  Glucose 70 - 99 mg/dL 52  79  82   BUN 8 - 23 mg/dL 50  53  19   Creatinine 0.44 - 1.00 mg/dL 5.40  9.81  1.91   Sodium 135 - 145 mmol/L 132  130  132   Potassium 3.5 - 5.1 mmol/L 4.9  4.8  4.2   Chloride 98 - 111 mmol/L 104  100  101   CO2 22 - 32 mmol/L 14  16  20    Calcium 8.9 - 10.3 mg/dL 8.5  9.3  9.0   Total Protein 6.5 - 8.1 g/dL   7.0   Total Bilirubin 0.3 - 1.2 mg/dL   0.5   Alkaline Phos 38 - 126 U/L   79   AST 15 - 41 U/L   31   ALT 0 - 44 U/L   <5      Pertinent Imaging today Plain films and CT images have been personally visualized and interpreted; radiology reports have been reviewed. Decision making incorporated into the Impression /   ECHOCARDIOGRAM LIMITED  Result Date: 09/02/2022    ECHOCARDIOGRAM  LIMITED REPORT   Patient Name:   Sydney Gillespie Date of Exam: 09/01/2022 Medical Rec #:  [id number]          Height:       65.0 in Accession #:    [id number]         Weight:       121.3 lb Date of Birth:  [birth year]           BSA:          1.599 m Patient Age:    82 years           BP:           97/48 mmHg Patient Gender: F                  HR:           64 bpm. Exam Location:  [facility] Procedure: 2D Echo, Cardiac Doppler, Limited Echo and Limited Color Doppler Indications:     R55 Syncope  History:         Patient has prior history of Echocardiogram examinations, most                  recent 08/09/2022. Arrythmias:Palpitations; Risk                  Factors:Dyslipidemia.  Sonographer:     Daphine Deutscher RDCS Referring Phys:  [id number] Verdene Lennert Diagnosing Phys: Alwyn Pea MD IMPRESSIONS  1. Left ventricular ejection fraction, by estimation, is 50 to 55%. The left ventricle has low normal function. The left ventricle demonstrates regional wall motion abnormalities (see scoring diagram/findings for description).  Left ventricular diastolic  parameters were normal.  2. Right ventricular systolic function is normal. The right ventricular size is normal.  3. Left atrial size was mildly dilated.  4. A small pericardial effusion is present.  5. The mitral valve is normal in structure. Trivial mitral valve regurgitation.  6. The aortic valve is calcified. Aortic valve regurgitation is mild to moderate. Aortic valve sclerosis/calcification is present, without any evidence of aortic stenosis. FINDINGS  Left Ventricle: Left ventricular ejection fraction, by estimation, is 50 to 55%. The left ventricle has low normal function. The left ventricle demonstrates regional wall motion abnormalities. The left ventricular internal cavity size was normal in size. There is no left ventricular hypertrophy. Left ventricular diastolic parameters were normal. Right Ventricle: The right ventricular size is normal. No increase in  right ventricular wall thickness. Right ventricular systolic function is normal. Left Atrium: Left atrial size was mildly dilated. Right Atrium: Right atrial size was not assessed. Pericardium: A small pericardial effusion is present. Mitral Valve: The mitral valve is normal in structure. Trivial mitral valve regurgitation. Tricuspid Valve: The tricuspid valve is normal in structure. Tricuspid valve regurgitation is mild. Aortic Valve: The aortic valve is calcified. Aortic valve regurgitation is mild to moderate. Aortic regurgitation PHT measures 572 msec. Aortic valve sclerosis/calcification is present, without any evidence of aortic stenosis. Pulmonic Valve: The pulmonic valve was normal in structure. Pulmonic valve regurgitation is not visualized. Aorta: The ascending aorta was not well visualized. IAS/Shunts: The interatrial septum was not well visualized. Additional Comments: There is no pleural effusion.  LEFT VENTRICLE PLAX 2D LVIDd:         4.10 cm Diastology LVIDs:         3.00 cm LV e' medial:    6.14 cm/s LV PW:         1.00 cm LV E/e' medial:  10.7 LV IVS:        1.00 cm LV e' lateral:   8.22 cm/s                        LV E/e' lateral: 8.0  RIGHT VENTRICLE             IVC RV S prime:     13.40 cm/s  IVC diam: 1.10 cm TAPSE (M-mode): 1.8 cm LEFT ATRIUM         Index LA diam:    4.60 cm 2.88 cm/m  AORTIC VALVE AI PHT:      572 msec  AORTA Ao Root diam: 3.40 cm MITRAL VALVE               TRICUSPID VALVE MV Area (PHT): 2.95 cm    TR Peak grad:   14.7 mmHg MV Decel Time: 257 msec    TR Vmax:        192.00 cm/s MV E velocity: 65.50 cm/s MV A velocity: 90.70 cm/s MV E/A ratio:  0.72 Dwayne D Callwood MD Electronically signed by Alwyn Pea MD Signature Date/Time: 09/02/2022/7:27:49 AM    Final    DG Chest Port 1 View  Result Date: 09/02/2022 CLINICAL DATA:  PICC placement EXAM: PORTABLE CHEST 1 VIEW COMPARISON:  Yesterday FINDINGS: Right upper extremity PICC with tip at the SVC. Cardiomegaly.  Streaky opacity in the bilateral lungs. No visible effusion or pneumothorax. Chronic cardiomegaly. Multilevel cement augmentation in the thoracic spine. Gas distended upper abdominal colon. IMPRESSION: 1. Right upper extremity PICC with tip at the SVC. 2. Cardiomegaly and  borderline vascular congestion. Atelectatic type opacity in the bilateral lungs. Electronically Signed   By: Tiburcio Pea M.D.   On: 09/02/2022 04:34   US RENAL  Result Date: 09/01/2022 CLINICAL DATA:  [id number] AKI (acute kidney injury) [id number] EXAM: RENAL / URINARY TRACT ULTRASOUND COMPLETE COMPARISON:  11/04/2017 FINDINGS: Right Kidney: Renal measurements: 9.6 x 4.5 x 4.6 cm = volume: 102 mL. Parenchyma echogenic compared to adjacent liver. No focal lesion or hydronephrosis. Left Kidney: Renal measurements: 8.2 x 4.2 x 3.7 cm = volume: 67 mL. No hydronephrosis. Exophytic 3.1 x 2.7 x 2.9 cm cyst from lower pole as before. Bladder: Incompletely distended.  Bilateral ureteral jets demonstrated. Other: Bilateral pleural effusions incidentally noted. IMPRESSION: 1. No hydronephrosis. 2. Echogenic renal parenchyma suggesting medical renal disease. 3. 3.1 cm left renal cyst. 4. Bilateral pleural effusions. Electronically Signed   By: Corlis Leak M.D.   On: 09/01/2022 18:35   DG Chest Portable 1 View  Result Date: 09/01/2022 CLINICAL DATA:  Recent pneumonia EXAM: PORTABLE CHEST 1 VIEW COMPARISON:  08/07/2022 FINDINGS: Previously noted bibasilar and right lung opacities are improved, with some residual opacities in the mid to lower right lung and lower left lung, which are somewhat linear and may reflect atelectasis or infection. Unchanged cardiac and mediastinal contours. Aortic atherosclerosis. No pleural effusion or pneumothorax. No acute osseous abnormality. Prior ACDF. IMPRESSION: Previously noted bibasilar and right lung opacities are improved, with some residual opacities in the mid to lower right lung and lower left lung, which may reflect  atelectasis and/or infection. Electronically Signed   By: Wiliam Ke M.D.   On: 09/01/2022 15:26   CT HEAD WO CONTRAST ( )  Result Date: 09/01/2022 CLINICAL DATA:  Trauma EXAM: CT HEAD WITHOUT CONTRAST TECHNIQUE: Contiguous axial images were obtained from the base of the skull through the vertex without intravenous contrast. RADIATION DOSE REDUCTION: This exam was performed according to the departmental dose-optimization program which includes automated exposure control, adjustment of the mA and/or kV according to patient size and/or use of iterative reconstruction technique. COMPARISON:  None Available. FINDINGS: Brain: No evidence of acute infarction, hemorrhage, hydrocephalus, extra-axial collection or mass lesion/mass effect. Sequela of moderate chronic microvascular ischemic change. Vascular: No hyperdense vessel or unexpected calcification. Skull: Normal. Negative for fracture or focal lesion. Sinuses/Orbits: No mastoid or middle ear effusion. Paranasal sinuses are notable for frothy secretions in the bilateral sphenoid sinuses, which can be seen in the setting sinusitis. Bilateral lens replacement. Orbits are otherwise unremarkable. Other: None. IMPRESSION: 1. No acute intracranial abnormality. 2. Frothy secretions in the bilateral sphenoid sinuses can be seen in the setting of acute sinusitis. Electronically Signed   By: Lorenza Cambridge M.D.   On: 09/01/2022 14:36   MM 3D SCREEN BREAST BILATERAL  Result Date: 08/18/2022 CLINICAL DATA:  Screening. EXAM: DIGITAL SCREENING BILATERAL MAMMOGRAM WITH TOMOSYNTHESIS AND CAD TECHNIQUE: Bilateral screening digital craniocaudal and mediolateral oblique mammograms were obtained. Bilateral screening digital breast tomosynthesis was performed. The images were evaluated with computer-aided detection. COMPARISON:  Previous exam(s). ACR Breast Density Category b: There are scattered areas of fibroglandular density. FINDINGS: There are no findings suspicious for  malignancy. IMPRESSION: No mammographic evidence of malignancy. A result letter of this screening mammogram will be mailed directly to the patient. RECOMMENDATION: Screening mammogram in one year. (Code:SM-B-01Y) BI-RADS CATEGORY  1: Negative. Electronically Signed   By: Frederico Hamman M.D.   On: 08/18/2022 15:10   [place] EKG SITE RITE  Result Date: 08/11/2022 If Site Rite image not  attached, placement could not be confirmed due to current cardiac rhythm.  ECHOCARDIOGRAM COMPLETE  Result Date: 08/09/2022    ECHOCARDIOGRAM REPORT   Patient Name:   Sydney Gillespie Date of Exam: 08/09/2022 Medical Rec #:  [id number]          Height:       65.0 in Accession #:    [id number]         Weight:       132.0 lb Date of Birth:  1941/01/21           BSA:          1.658 m Patient Age:    82 years           BP:           167/91 mmHg Patient Gender: F                  HR:           102 bpm. Exam Location:  [facility] Procedure: 2D Echo Indications:     Bacteremia R78.81  History:         Patient has prior history of Echocardiogram examinations, most                  recent 05/07/2022.  Sonographer:     Overton Mam RDCS Referring Phys:  [id number] Inetta Fermo LAI Diagnosing Phys: Dina Rich MD  Sonographer Comments: Suboptimal subcostal window. Image acquisition challenging due to respiratory motion. IMPRESSIONS  1. Left ventricular ejection fraction, by estimation, is 35 to 40%. The left ventricle has moderately decreased function. The left ventricle demonstrates global hypokinesis. There is mild left ventricular hypertrophy. Left ventricular diastolic parameters are indeterminate. Elevated left atrial pressure.  2. Right ventricular systolic function is normal. The right ventricular size is normal.  3. Left atrial size was severely dilated.  4. The mitral valve is abnormal. Moderate mitral valve regurgitation. No evidence of mitral stenosis.  5. The tricuspid valve is abnormal.  6. The aortic valve is tricuspid. There is mild  calcification of the aortic valve. There is mild thickening of the aortic valve. Aortic valve regurgitation is moderate. No aortic stenosis is present. FINDINGS  Left Ventricle: Left ventricular ejection fraction, by estimation, is 35 to 40%. The left ventricle has moderately decreased function. The left ventricle demonstrates global hypokinesis. The left ventricular internal cavity size was normal in size. There is mild left ventricular hypertrophy. Left ventricular diastolic parameters are indeterminate. Elevated left atrial pressure. Right Ventricle: Unable to estimated PASP, IVC poorly visualized. The right ventricular size is normal. Right vetricular wall thickness was not well visualized. Right ventricular systolic function is normal. Left Atrium: Left atrial size was severely dilated. Right Atrium: Right atrial size was normal in size. Pericardium: The pericardium was not well visualized. Mitral Valve: The mitral valve is abnormal. Mild mitral annular calcification. Moderate mitral valve regurgitation. No evidence of mitral valve stenosis. MV peak gradient, 10.2 mmHg. The mean mitral valve gradient is 5.0 mmHg. Tricuspid Valve: The tricuspid valve is abnormal. Tricuspid valve regurgitation is mild . No evidence of tricuspid stenosis. Aortic Valve: The aortic valve is tricuspid. There is mild calcification of the aortic valve. There is mild thickening of the aortic valve. There is mild aortic valve annular calcification. Aortic valve regurgitation is moderate. Aortic regurgitation PHT  measures 297 msec. No aortic stenosis is present. Aortic valve mean gradient measures 6.3 mmHg. Aortic valve peak gradient measures 12.1 mmHg. Aortic valve area, by  VTI measures 1.35 cm. Pulmonic Valve: The pulmonic valve was not well visualized. Pulmonic valve regurgitation is not visualized. No evidence of pulmonic stenosis. Aorta: The aortic root is normal in size and structure. Venous: The inferior vena cava was not well  visualized. IAS/Shunts: The interatrial septum was not well visualized.  LEFT VENTRICLE PLAX 2D LVIDd:         3.60 cm      Diastology LVIDs:         3.00 cm      LV e' medial:    5.55 cm/s LV PW:         1.10 cm      LV E/e' medial:  20.0 LV IVS:        1.10 cm      LV e' lateral:   6.31 cm/s LVOT diam:     1.90 cm      LV E/e' lateral: 17.6 LV SV:         43 LV SV Index:   26 LVOT Area:     2.84 cm  LV Volumes (MOD) LV vol d, MOD A2C: 66.3 ml LV vol d, MOD A4C: 104.0 ml LV vol s, MOD A2C: 34.4 ml LV vol s, MOD A4C: 61.7 ml LV SV MOD A2C:     31.9 ml LV SV MOD A4C:     104.0 ml LV SV MOD BP:      34.2 ml RIGHT VENTRICLE RV Basal diam:  2.00 cm RV S prime:     18.40 cm/s TAPSE (M-mode): 1.6 cm LEFT ATRIUM             Index        RIGHT ATRIUM          Index LA diam:        5.40 cm 3.26 cm/m   RA Area:     8.97 cm LA Vol (A2C):   92.6 ml 55.85 ml/m  RA Volume:   14.70 ml 8.87 ml/m LA Vol (A4C):   89.6 ml 54.04 ml/m LA Biplane Vol: 95.0 ml 57.30 ml/m  AORTIC VALVE                     PULMONIC VALVE AV Area (Vmax):    1.38 cm      PV Vmax:        1.03 m/s AV Area (Vmean):   1.34 cm      PV Peak grad:   4.2 mmHg AV Area (VTI):     1.35 cm      RVOT Peak grad: 4 mmHg AV Vmax:           173.84 cm/s AV Vmean:          120.488 cm/s AV VTI:            0.321 m AV Peak Grad:      12.1 mmHg AV Mean Grad:      6.3 mmHg LVOT Vmax:         84.90 cm/s LVOT Vmean:        56.900 cm/s LVOT VTI:          0.153 m LVOT/AV VTI ratio: 0.48 AI PHT:            297 msec  AORTA Ao Root diam: 3.20 cm Ao Asc diam:  2.90 cm MITRAL VALVE                TRICUSPID VALVE MV Area (PHT): 7.59  cm     TR Peak grad:   34.1 mmHg MV Area VTI:   1.37 cm     TR Vmax:        292.00 cm/s MV Peak grad:  10.2 mmHg MV Mean grad:  5.0 mmHg     SHUNTS MV Vmax:       1.60 m/s     Systemic VTI:  0.15 m MV Vmean:      103.0 cm/s   Systemic Diam: 1.90 cm MV Decel Time: 100 msec MR Peak grad: 156.8 mmHg MR Mean grad: 105.0 mmHg MR Vmax:      626.00 cm/s MR  Vmean:     483.0 cm/s MV E velocity: 111.00 cm/s MV A velocity: 142.00 cm/s MV E/A ratio:  0.78 Dina Rich MD Electronically signed by Dina Rich MD Signature Date/Time: 08/09/2022/10:45:10 AM    Final    MR THORACIC SPINE W WO CONTRAST  Result Date: 08/07/2022 CLINICAL DATA:  Staphylococcal bacteremia with recent spine procedure. EXAM: MRI THORACIC WITHOUT AND WITH CONTRAST TECHNIQUE: Multiplanar and multiecho pulse sequences of the thoracic spine were obtained without and with intravenous contrast. CONTRAST:  6mL GADAVIST GADOBUTROL 1 MMOL/ML IV SOLN COMPARISON:  None Available. FINDINGS: Alignment: There is increased kyphosis at the midthoracic spine, centered at T9. Vertebrae: Status post augmentation at T8, T9 and T10. Height loss of approximately 75% at T9 is unchanged. No progressive height loss at T8 or T10. However, abnormal marrow signal at T8 and T10 has worsened. There is persistent abnormal signal within the disc space. Cord: Normal signal and morphology. There is a thin crescent of epidural contrast enhancement along the ventral margin of the spinal canal, which may be prominent venous plexus. Paraspinal and other soft tissues: Negative Disc levels: There is moderate spinal canal stenosis at the T9 level. IMPRESSION: 1. Status post augmentation at T8, T9 and T10 without progression of height loss. 2. 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 Small amount of ventral epidural enhancing material may be dilated venous plexus or small epidural abscess/phlegmon. 3. Moderate T9 spinal canal stenosis. Electronically Signed   By: Deatra Robinson M.D.   On: 08/07/2022 22:34   DG Chest Port 1 View  Result Date: 08/07/2022 CLINICAL DATA:  Status post right thoracentesis EXAM: PORTABLE CHEST 1 VIEW COMPARISON:  Chest radiograph dated 08/05/2022 FINDINGS: Low lung volumes. Mild right interstitial opacities. Bibasilar patchy opacities. Decreased trace right pleural effusion. Pneumothorax.  Similar cardiomediastinal silhouette. Cervical spinal fixation hardware appears intact. Multilevel vertebral augmentation. Unchanged right lateral rib fractures. Right upper quadrant surgical clips. IMPRESSION: 1. Decreased trace right pleural effusion. No pneumothorax. 2. Low lung volumes with mild right interstitial opacities and bibasilar patchy opacities. Electronically Signed   By: Agustin Cree M.D.   On: 08/07/2022 16:26   US THORACENTESIS ASP PLEURAL SPACE W/IMG GUIDE  Result Date: 08/07/2022 INDICATION: History of stage IV CKD admitted for acute respiratory failure with hypoxia and aspiration pneumonia. Patient found to have moderate right small left pleural effusion. Request received for diagnostic and therapeutic right thoracentesis. EXAM: ULTRASOUND GUIDED DIAGNOSTIC AND THERAPEUTIC RIGHT THORACENTESIS MEDICATIONS: 10 mL 1 % lidocaine COMPLICATIONS: None immediate. PROCEDURE: An ultrasound guided thoracentesis was thoroughly discussed with the patient and questions answered. The benefits, risks, alternatives and complications were also discussed. The patient understands and wishes to proceed with the procedure. Written consent was obtained. Ultrasound was performed to localize and mark an adequate pocket of fluid in the right chest. The area was then prepped and draped in  the normal sterile fashion. 1% Lidocaine was used for local anesthesia. Under ultrasound guidance a 6 Fr Safe-T-Centesis catheter was introduced. Thoracentesis was performed. The catheter was removed and a dressing applied. FINDINGS: A total of approximately 700 cc of hazy, amber fluid was removed. Samples were sent to the laboratory as requested by the clinical team. IMPRESSION: Successful ultrasound guided right thoracentesis yielding 700 cc of pleural fluid. Read by: Alex Gardener, AGNP-BC Electronically Signed   By: Olive Bass M.D.   On: 08/07/2022 16:20   US Venous Img Lower Bilateral (DVT)  Result Date: 08/05/2022 CLINICAL  DATA:  Lower extremity weakness EXAM: BILATERAL LOWER EXTREMITY VENOUS DOPPLER ULTRASOUND TECHNIQUE: Gray-scale sonography with compression, as well as color and duplex ultrasound, were performed to evaluate the deep venous system(s) from the level of the common femoral vein through the popliteal and proximal calf veins. COMPARISON:  None Available. FINDINGS: VENOUS Nonocclusive thrombus in the right posterior tibial vein. Remaining venous structures of both lower extremities demonstrate no evidence of DVT. OTHER None. Limitations: none IMPRESSION: 1. Nonocclusive calf vein DVT involving the right posterior tibial vein. No other deep vein thrombosis is demonstrated in either lower extremity. These results will be called to the ordering clinician or representative by the Radiologist Assistant, and communication documented in the PACS or [employer]. Electronically Signed   By: Gaylyn Rong M.D.   On: 08/05/2022 15:28   CT Angio Chest PE W/Cm &/Or Wo Cm  Result Date: 08/05/2022 CLINICAL DATA:  Pulmonary embolism (PE) suspected, high prob. Shortness of breath. EXAM: CT ANGIOGRAPHY CHEST WITH CONTRAST TECHNIQUE: Multidetector CT imaging of the chest was performed using the standard protocol during bolus administration of intravenous contrast. Multiplanar CT image reconstructions and MIPs were obtained to evaluate the vascular anatomy. RADIATION DOSE REDUCTION: This exam was performed according to the departmental dose-optimization program which includes automated exposure control, adjustment of the mA and/or kV according to patient size and/or use of iterative reconstruction technique. CONTRAST:  75mL OMNIPAQUE IOHEXOL 350 MG/ML SOLN COMPARISON:  CTA chest 05/06/2022. FINDINGS: Cardiovascular: Satisfactory opacification of the pulmonary arteries to the segmental level. No evidence of pulmonary embolism. Streak artifact from vertebral augmentation cement in the lower lobes. Normal heart size. No  pericardial effusion. Atherosclerotic calcifications of the aorta and coronary arteries. Mediastinum/Nodes: No enlarged mediastinal, hilar, or axillary lymph nodes. Thyroid gland, trachea, and esophagus demonstrate no significant findings. Lungs/Pleura: Moderate right and small left pleural effusions with adjacent atelectasis in the lower lobes. Ground-glass and tree-in-bud opacities in the left lower lobe, suspicious for aspiration or infection. No consolidation. No pneumothorax. Upper Abdomen: No acute abnormality. Musculoskeletal: Interval vertebral augmentation at T8, T9 and T10. Unchanged degenerative Schmorl's node in the T7 superior endplate. Unchanged mild compression deformity at T12. Partially visualized changes from prior ACDF. Review of the MIP images confirms the above findings. IMPRESSION: 1. No evidence of pulmonary embolism. 2. Moderate right and small left pleural effusions with adjacent atelectasis in the lower lobes. 3. Ground-glass and tree-in-bud opacities in the left lower lobe, suspicious for aspiration or infection. 4. Aortic Atherosclerosis (ICD10-I70.0).  Coronary atherosclerosis. Electronically Signed   By: Orvan Falconer M.D.   On: 08/05/2022 09:51   DG Chest 2 View  Result Date: 08/05/2022 CLINICAL DATA:  Shortness of breath EXAM: CHEST - 2 VIEW COMPARISON:  Chest x-ray dated May 09, 2022 FINDINGS: Cardiac and mediastinal contours are within normal limits. Elevation of the right hemidiaphragm. Right-greater-than-left basilar opacities, increased when compared with the prior no large  pleural effusion or evidence of pneumothorax. IMPRESSION: Right-greater-than-left basilar opacities, increased when compared with the prior, concerning for infection or aspiration. Electronically Signed   By: Allegra Lai M.D.   On: 08/05/2022 08:36    I have personally spent at least 50 minutes involved in face-to-face and non-face-to-face activities for this patient on the day of the visit.  Professional time spent includes the following activities: Preparing to see the patient (review of tests), Obtaining and/or reviewing separately obtained history (admission/discharge record), Performing a medically appropriate examination and/or evaluation , Ordering medications/tests/procedures, referring and communicating with other health care professionals, Documenting clinical information in the EMR, Independently interpreting results (not separately reported), Communicating results to the patient/family/caregiver, Counseling and educating the patient/family/caregiver and Care coordination (not separately reported).   Electronically signed by:   Odette Fraction, MD Infectious Disease Physician [facility] Pager: [phone]

## 2022-09-02 NOTE — Progress Notes (Addendum)
Progress Note   Patient: Sydney Gillespie:[id number] DOB: 1941-04-29 DOA: 09/01/2022     1 DOS: the patient was seen and examined on 09/02/2022   Brief hospital course: 82 year old female past medical history of recent MSSA bacteremia on cefazolin, multiple myeloma, chronic kidney disease stage IIIb, compression fracture status post kyphoplasty, hypertension, hyperlipidemia, rheumatoid arthritis presented with near syncope.  Sydney Gillespie states that she had been up and walking around this morning when she reached up to grab something from a cabinet and started seeing spots. She subsequently lost consciousness. Sydney Gillespie denies knowingly hitting her head. A home health aide found her on the ground and EMS was called. Patient's granddaughter at bedside states that over the last several weeks, patient has had intermittent dizziness that has been difficult for them to discern from her usual vertigo. She denies any prior loss of consciousness over the last few weeks. Sydney Gillespie also endorses frequent falls but states that it is usually due to bilateral lower extremity weakness that is chronic and unchanged. She denies any known fevers, chills, abdominal pain, diarrhea, shortness of breath, cough, chest pain or palpitations. She notes one-time episode of nausea with vomiting yesterday with no further episodes of nausea afterwards.   4/24.  Patient lethargic this morning.  MRI of the brain ordered to rule out stroke.  Assessment and Plan: * Lethargy Will get MRI of the brain to rule out stroke.  Hold Ditropan and Myrbetriq.  Decrease dose of Requip and Zyprexa.  Syncope Check orthostatic vital signs when able to do so.  AKI (acute kidney injury) Gentle IV fluid hydration.  MSSA bacteremia Continue cefazolin as per ID.  SIRS (systemic inflammatory response syndrome) Presenting with hypothermia, hypotension, and elevated WBC.  Continue cefazolin as per ID.  So far blood cultures negative for  less than 24 hours  Anemia of chronic disease With ferritin up at 377.  Hemoglobin down to 7.9 with IV fluid hydration.  May end up needing a blood transfusion during the hospital course will get a type and cross tomorrow and recheck hemoglobin.  History of DVT (deep vein thrombosis) During admission for COVID-19 in December 2023, patient was found to have a nonocclusive thrombus in the posterior tibial vein of the right calf.  Doppler in March 2024 demonstrated persistent DVT presence.  - Continue home Eliquis  Rheumatoid arthritis - Continue home Plaquenil  Hypothyroidism TSH elevated at 36.7.  Increased dose of levothyroxine.  Multiple myeloma Previously treated with daratumumab with plans to start Revlimid, but had to be placed on hold due to multiple admissions since December 2023.  - Continue outpatient follow-up with oncology  Palpitations Continue to monitor.        Subjective: Patient lethargic this morning.  I was able to wake her up with sternal rub she answered 1 question and then went right back to sleep.  Patient had a low sugar early morning.  Nursing staff stated she was more awake this afternoon.  Physical Exam: Vitals:   09/01/22 2327 09/02/22 0222 09/02/22 0500 09/02/22 0749  BP: (!) 90/38 (!) 114/44  (!) 120/59  Pulse: 74 92  70  Resp: Temp: 99.7 F (37.6 C) 100 F (37.8 C)  98.3 F (36.8 C)  TempSrc: Oral Oral  Oral  SpO2: 98% 97%  99%  Weight:   60.1 kg    Physical Exam HENT:     Head: Normocephalic.     Mouth/Throat:     Comments:  Unable to look into mouth. Eyes:     General: Lids are normal.     Conjunctiva/sclera: Conjunctivae normal.  Cardiovascular:     Rate and Rhythm: Normal rate and regular rhythm.     Heart sounds: Normal heart sounds, S1 normal and S2 normal.  Pulmonary:     Breath sounds: Normal breath sounds. No decreased breath sounds, wheezing, rhonchi or rales.  Abdominal:     Palpations: Abdomen is soft.      Tenderness: There is no abdominal tenderness.  Musculoskeletal:     Right lower leg: No swelling.     Left lower leg: No swelling.  Skin:    General: Skin is warm.     Findings: No rash.  Neurological:     Mental Status: She is lethargic.     Data Reviewed: A.m. cortisol 20.8, sodium 132, BUN 50, creatinine 2.13, CO2 14, BNP 394, procalcitonin negative, ferritin 377, hemoglobin 7.9, white blood cell count 12.2, platelet count 240  Family Communication: Spoke with the patient's granddaughter on the phone  Disposition: Status is: Inpatient Remains inpatient appropriate because: Patient very lethargic this morning.  Will get an MRI of the brain to rule out stroke.  Patient also had a low sugar this morning will start on D5 normal saline.  Patient also has acute kidney injury.  Planned Discharge Destination: Home with Home Health    Time spent: 28 minutes  Author: Alford Highland, MD 09/02/2022 1:13 PM  For on call review [email or web address].

## 2022-09-02 NOTE — Evaluation (Signed)
Occupational Therapy Evaluation Patient Details Name: Sydney Gillespie MRN: [id number] DOB: April 21, 1941 Today's Date: 09/02/2022   History of Present Illness 82 year old female past medical history of recent MSSA bacteremia on cefazolin, multiple myeloma, chronic kidney disease stage IIIb, compression fracture status post kyphoplasty, hypertension, hyperlipidemia, rheumatoid arthritis presented with near syncope.   Clinical Impression   Sydney Gillespie was seen for OT evaluation this date. Prior to hospital admission, pt was residing at home with her granddaughter available to provide intermittent assist with ADL management. Per pt/granddaughter pt requires assistance to put on her clothes in the morning and to complete sponge baths. Pt granddaughter reports significant concerns with pt's mobility at baseline and endorses pt has had at least 7 falls in the last month.  Pt presents to acute OT demonstrating impaired ADL performance and functional mobility 2/2 decreased cognition, generalized weakness, decreased balance, and decreased safety awareness (See OT problem list for additional functional deficits). Pt currently requires +2 MOD A for STS/SPT to/from [facility] as well as MOD A for bed mobility.  Pt would benefit from skilled OT services to address noted impairments and functional limitations (see below for any additional details) in order to maximize safety and independence while minimizing falls risk and caregiver burden. Anticipate the need for ongoing OT services upon acute hospital DC.         Recommendations for follow up therapy are one component of a multi-disciplinary discharge planning process, led by the attending physician.  Recommendations may be updated based on patient status, additional functional criteria and insurance authorization.   Assistance Recommended at Discharge Frequent or constant Supervision/Assistance  Patient can return home with the following Assistance with  cooking/housework;Two people to help with walking and/or transfers;A lot of help with bathing/dressing/bathroom;Assist for transportation;Help with stairs or ramp for entrance    Functional Status Assessment  Patient has had a recent decline in their functional status and demonstrates the ability to make significant improvements in function in a reasonable and predictable amount of time.  Equipment Recommendations  None recommended by OT (Pt has necessary equipment.)    Recommendations for Other Services       Precautions / Restrictions Precautions Precautions: Fall Restrictions Weight Bearing Restrictions: No      Mobility Bed Mobility Overal bed mobility: Needs Assistance Bed Mobility: Supine to Sit, Sit to Supine     Supine to sit: Mod assist Sit to supine: Mod assist        Transfers Overall transfer level: Needs assistance Equipment used: 2 person hand held assist Transfers: Sit to/from Stand, Bed to chair/wheelchair/BSC Sit to Stand: Mod assist, +2 physical assistance Stand pivot transfers: Mod assist, +2 physical assistance, From elevated surface                Balance Overall balance assessment: Needs assistance Sitting-balance support: Feet supported Sitting balance-Leahy Scale: Fair     Standing balance support: Bilateral upper extremity supported, During functional activity, Reliant on assistive device for balance Standing balance-Leahy Scale: Poor Standing balance comment: Requires support to maintain standing.                           ADL either performed or assessed with clinical judgement   ADL Overall ADL's : Needs assistance/impaired                         Toilet Transfer: +2 for physical assistance;Moderate assistance;BSC/3in1   ToiletingTeacher, music  and Hygiene: Set up;Supervision/safety       Functional mobility during ADLs: Moderate assistance;Cueing for safety General ADL Comments: MOD A (2  person hand held assist) to STS from EOB and BSC.     Vision Ability to See in Adequate Light: 1 Impaired Additional Comments: Has had contact stuck in her eye for the last 4 days. RN Aware.     Perception     Praxis      Pertinent Vitals/Pain Pain Assessment Pain Assessment: No/denies pain     Hand Dominance Right   Extremity/Trunk Assessment Upper Extremity Assessment Upper Extremity Assessment: Generalized weakness   Lower Extremity Assessment Lower Extremity Assessment: Generalized weakness   Cervical / Trunk Assessment Cervical / Trunk Assessment: Other exceptions Cervical / Trunk Exceptions: T8-10 kypho 07/29/22   Communication Communication Communication: No difficulties   Cognition Arousal/Alertness: Lethargic   Overall Cognitive Status: Impaired/Different from baseline Area of Impairment: Safety/judgement, Awareness, Problem solving                         Safety/Judgement: Decreased awareness of safety, Decreased awareness of deficits Awareness: Intellectual Problem Solving: Slow processing, Requires verbal cues, Requires tactile cues, Decreased initiation General Comments: Patient is generally quite lethargic in the am per granddaughter. PM is better for her.     General Comments       Exercises Other Exercises Other Exercises: Pt/family at bedside educated on role of OT in acute setting, safety, falls prevention strategies for home and hospital, and DC recs.   Shoulder Instructions      Home Living Family/patient expects to be discharged to:: Private residence Living Arrangements: Other relatives Engineer, building services) Available Help at Discharge: Family;Available PRN/intermittently Type of Home: House Home Access: Stairs to enter [employer] of Steps: 5 Entrance Stairs-Rails: Right;Left;Can reach both Home Layout: One level     Bathroom Shower/Tub: Producer, television/film/video: Standard     Home Equipment: Shower seat -  built in;Rollator (4 wheels);Adaptive equipment Adaptive Equipment: Reacher Additional Comments: Has adaptive equipment for kitchen, uses barstool to sit while preparing meals.      Prior Functioning/Environment Prior Level of Function : Independent/Modified Independent             Mobility Comments: Uses 4WW for mobility, 7 falls in last month, per family primarily due to her vertigo. Has been getting HH PT weekly. ADLs Comments: Requires assistance from family for LB dressing, bathing (sponge bathes), and IADL managent.        OT Problem List: Decreased strength;Decreased coordination;Decreased range of motion;Decreased activity tolerance;Decreased safety awareness;Impaired balance (sitting and/or standing);Decreased knowledge of use of DME or AE;Decreased cognition      OT Treatment/Interventions: Self-care/ADL training;Therapeutic activities;DME and/or AE instruction;Patient/family education;Balance training;Energy conservation    OT Goals(Current goals can be found in the care plan section) Acute Rehab OT Goals Patient Stated Goal: To go home OT Goal Formulation: With patient Time For Goal Achievement: 09/16/22 Potential to Achieve Goals: Good  OT Frequency: Min 2X/week    Co-evaluation              AM-PAC OT "6 Clicks" Daily Activity     Outcome Measure Help from another person eating meals?: A Little Help from another person taking care of personal grooming?: A Little Help from another person toileting, which includes using toliet, bedpan, or urinal?: A Lot Help from another person bathing (including washing, rinsing, drying)?: A Lot Help from another person to put on  and taking off regular upper body clothing?: A Little Help from another person to put on and taking off regular lower body clothing?: A Lot 6 Click Score: 15   End of Session Equipment Utilized During Treatment: Gait belt;Rolling walker (2 wheels)  Activity Tolerance: Patient tolerated treatment  well Patient left: in bed;with call bell/phone within reach;with family/visitor present  OT Visit Diagnosis: Other abnormalities of gait and mobility (R26.89)                Time: [id number] OT Time Calculation (min): 42 min Charges:  OT General Charges $OT Visit: 1 Visit OT Evaluation $OT Eval Moderate Complexity: 1 Mod OT Treatments $Self Care/Home Management : 23-37 mins  Rockney Ghee, M.S., OTR/L 09/02/22, 3:34 PM

## 2022-09-02 NOTE — Progress Notes (Signed)
PT Cancellation Note  Patient Details Name: Sydney Gillespie MRN: [id number] DOB: 1940/05/23   Cancelled Treatment:    Reason Eval/Treat Not Completed: Fatigue/lethargy limiting ability to participate Patient too lethargic at this time. Will re-attempt PT evaluation later today as time allows.   [name] 09/02/2022, 9:35 AM

## 2022-09-02 NOTE — Evaluation (Signed)
Clinical/Bedside Swallow Evaluation Patient Details  Name: Sydney Gillespie MRN: [id number] Date of Birth: 1940-12-11  Today's Date: 09/02/2022 Time: SLP Start Time (ACUTE ONLY): 1405 SLP Stop Time (ACUTE ONLY): 1450 SLP Time Calculation (min) (ACUTE ONLY): 45 min  Past Medical History:  Past Medical History:  Diagnosis Date   Acute respiratory failure with hypoxia 05/07/2022   Acute respiratory failure with hypoxia 05/07/2022   Allergic rhinitis    Allergy    Anemia    Anemia    Barrett's esophagus    Blood dyscrasia    multiple myloma remission   Cancer    Change in bowel habits    Compression fracture 2013   Degenerative disc disease, lumbar    Dysrhythmia    palpitations   Esophageal reflux    Family history of adverse reaction to anesthesia    nausea -mom   GERD (gastroesophageal reflux disease)    Gout    H/O bone marrow transplant    Heart palpitations    Hyperlipidemia    Hypothyroidism    Inflammatory polyarthropathy    Lumbago    Lumbar radiculitis    Lumbar stenosis with neurogenic claudication    Multiple myeloma    Multiple myeloma    Neuropathy    Bilateral Feet   Osteopenia    Osteoporosis    Other dysphagia    Palpitations    Pneumonia 04/2022   R/T COVID   Positive PPD    Renal insufficiency    Rheumatoid arthritis    Rheumatoid arthritis    Seasonal allergies    Tuberculosis 2011   Vertigo    Past Surgical History:  Past Surgical History:  Procedure Laterality Date   ABDOMINAL HYSTERECTOMY     APPENDECTOMY     BACK SURGERY     BREAST EXCISIONAL BIOPSY Bilateral    neg   CATARACT EXTRACTION Bilateral    CHOLECYSTECTOMY     COLONOSCOPY WITH PROPOFOL N/A 11/12/2016   Procedure: COLONOSCOPY WITH PROPOFOL;  Surgeon: Scot Jun, MD;  Location: [facility] ENDOSCOPY;  Service: Endoscopy;  Laterality: N/A;   ESOPHAGOGASTRODUODENOSCOPY (EGD) WITH PROPOFOL N/A 11/12/2016   Procedure: ESOPHAGOGASTRODUODENOSCOPY (EGD) WITH PROPOFOL;   Surgeon: Scot Jun, MD;  Location: [facility] ENDOSCOPY;  Service: Endoscopy;  Laterality: N/A;   ESOPHAGOGASTRODUODENOSCOPY (EGD) WITH PROPOFOL N/A 07/17/2022   Procedure: ESOPHAGOGASTRODUODENOSCOPY (EGD) WITH PROPOFOL;  Surgeon: Regis Bill, MD;  Location: [facility] ENDOSCOPY;  Service: Endoscopy;  Laterality: N/A;   FOOT SURGERY     KYPHOPLASTY N/A 07/29/2022   Procedure: KYPHOPLASTY THORACIC EIGHT, THORACIC NINE, THORACIC TEN;  Surgeon: Tressie Stalker, MD;  Location: [facility] OR;  Service: Neurosurgery;  Laterality: N/A;  3C   LUMBAR LAMINECTOMY/DECOMPRESSION MICRODISCECTOMY Left 06/18/2016   Procedure: LAMINECTOMY FOR FACET/SYNOVIAL CYST LUMBAR THREE - LUMBAR FOUR LEFT;  Surgeon: Tressie Stalker, MD;  Location: [facility] OR;  Service: Neurosurgery;  Laterality: Left;  LAMINECTOMY FOR FACET/SYNOVIAL CYST LUMBAR THREE - LUMBAR FOUR LEFT   HPI:  Pt is a 82 y.o. female with medical history significant of recent MSSA bacteremia on cefazolin, multiple myeloma, CKD stage IIIb, compression fracture s/p kyphoplasty, hypertension, hyperlipidemia, rheumatoid arthritis, who presents to the ED due to near syncope. Per chart, pt stated episode of Nausea with Vomiting yesterday with no further episodes of nausea afterwards.  She was recently admitted 08/05/22 with SOB, acute respiratory failure with hypoxia.  Pt reported episodes of regurgitation then; "food gets stuck" -- pointing to her mid-sternum area. Recent EGD 07/2022 indicating Esophageal phase Dysmotility  and spastic distal esophagus/lower esophageal sphincter; Hiatal Hernia.    PMH: Barrett's Esophagus, GERD, esophageal phase dysphagia, RA, PNA/COVID, compression fx s/p kyphoplasty, HLD, hypothyroidism, gout, CKD4, DVT, cancer (multiple myeloma), DDD.   CXR this admit: Cardiomegaly and borderline vascular congestion. Atelectatic type  opacity in the bilateral lungs - previously noted bibasilar and right lung opacities are improved, with some residual opacities in  the mid to lower right lung and  lower left lung,.     OF NOTE FROM LAST ADMIT/NOTE: pt had a recent EGD 07/2022 stating it was "stretched" then but that she "shouldn't have any more endoscopies" but instead a "motility test".  Recommendation was given to f/u w/ GI for further education and management of Esophageal phase Dysmotility last admit but Family has been unable to get pt to a GI appt per their report.    Assessment / Plan / Recommendation  Clinical Impression   Pt seen for BSE today. Pt awake and sitting upright in bed w/ GrandDtr assisting pt w/ feeding. Husband arrived. Pt verbalized and answered basic questions re: self; min slower responses intermittently. NSG reported lethargy this morning but more alertness as the day has gone on. Noted pt's low blood sugars, low body temp at admit. Pt is known to this service.  Pt on RA, afebrile.   OF NOTE: Pt has a Chronic h/o Esophageal phase Dysmotility and strongly endorses s/s of Esophageal phase Dysmotility w/ episodes of Regurgitation/REFLUX at home. She stated she has to cough the food "back up when it gets stuck here" -- pointing to her mid-sternum area. Noted recent EGD: pt has KNOWN Significant Esophageal phase Dysmotility w/ EGD(07/17/22): "Abnormal motility was noted in the esophagus. The cricopharyngeus was abnormal. There is a decrease in motility of the esophageal body. The distal esophagus/ lower esophageal sphincter is spastic". Hiatal Hernia and Barrett's Esophagus dx'd.   Pt appears at reduced risk for aspiration from an oropharyngeal phase standpoint following general aspiration precautions. HOWEVER, pt has a Baseline presentation of Esophageal phase Dysmotility and REFLUX w/ episodes of Regurgitation.  ANY Dysmotility or Regurgitation of Reflux material can increase risk for aspiration of the Reflux material during Retrograde flow thus impact Pulmonary status. Pt described issues of globus, dry throat clearing/cough, and regurgitation of  "stuck foods" as her Baseline.     Pt appears to present w/ functional oropharyngeal phase swallowing w/ po trials of her lunch meal and hot chocolate via cup/other drinks via straw w/ this SLP and her Granddaughter(who assisted w/ feeding some of the meal). Pt consumed trials of thin liquids, puree and moistened soft solids w/ No overt oropharyngeal phase dysphagia appreciated during oral intake of trials; no neuromuscular deficits appreciated.  No overt clinical s/s of aspiration noted; clear vocal quality b/t trials, no decline in pulmonary status, no coughing. Oral phase appeared [facility] for bolus management and timely A-P transfer/clearing of material. Mastication appropriate/timely.  OM Exam appeared [facility] for bolus management and oral clearing. Pt helped to feed self by holding Cup when drinking; feeding self tsps of applesauce.   Recommend a more Mech soft diet consistency/fairly Regular diet for choices -- BUT cut/chopped foods, moistened well; thin liquids -- less straw use to decrease air swallowed and less Carbonated drinks during meals. General aspiration precautions. STRICT REFLUX PRECAUTIONS at meals. Rest Breaks during meals/oral intake to allow for Esophageal clearing. Strongly recommended to lessen eating of problematic foods to lessent chance for Regurgitation. HOB elevated/sitting up post meals, at night when sleeping. Pills given  in Puree for safer swallowing.    Recommend pt f/u w/ GI for management/Education of her Esophageal phase Dysmotility including Barrett's Esophagus. Discussion on REFLUX precautions provided. Encouraged f/u w/ Dietician for nutrition support; foods options she does best with. Granddaughter agreed.  No further ST services indicated in setting of functional oropharyngeal swallowing; education completed. MD to reconsult ST services if any new needs while admitted. NSG updated. Pt appreciative of Education information. SLP Visit Diagnosis: Dysphagia, unspecified (R13.10)  (Esophageal phase Dysmotility - known)    Aspiration Risk  Mild aspiration risk;Risk for inadequate nutrition/hydration (from an Esophageal phase standpoint - regurgitation)    Diet Recommendation   a more Mech soft diet consistency/fairly Regular diet for choices -- BUT cut/chopped foods, moistened well; thin liquids -- less straw use to decrease air swallowed and less Carbonated drinks during meals. General aspiration precautions. STRICT REFLUX PRECAUTIONS at meals. Rest Breaks during meals/oral intake to allow for Esophageal clearing. Strongly recommended to lessen eating of problematic foods to lessent chance for Regurgitation. HOB elevated/sitting up post meals.   Medication Administration: Whole meds with puree -- recommended previously   Other  Recommendations Recommended Consults: Consider GI evaluation;Consider esophageal assessment (Dietician f/u; Palliative Care f/u for education on Chronic conditions) Oral Care Recommendations: Oral care BID;Oral care before and after PO;Patient independent with oral care (setup)    Recommendations for follow up therapy are one component of a multi-disciplinary discharge planning process, led by the attending physician.  Recommendations may be updated based on patient status, additional functional criteria and insurance authorization.  Follow up Recommendations No SLP follow up      Assistance Recommended at Discharge  intermittent  Functional Status Assessment Patient has not had a recent decline in their functional status  Frequency and Duration  (n/a)   (n/a)       Prognosis Prognosis for improved oropharyngeal function: Fair Barriers to Reach Goals:  (-Good) Barriers/Prognosis Comment: BASELINE dx'd dx'd Barrett's esophagus and GERD; reports of Regurgitation and potential aspiration of reflux material      Swallow Study   General Date of Onset: 09/01/22 HPI: Pt is a 82 y.o. female with medical history significant of recent MSSA  bacteremia on cefazolin, multiple myeloma, CKD stage IIIb, compression fracture s/p kyphoplasty, hypertension, hyperlipidemia, rheumatoid arthritis, who presents to the ED due to near syncope.  She was recently admitted 08/05/22 with SOB, acute respiratory failure with hypoxia. Pt reported episodes of regurgitation then; "food gets stuck" -- pointing to her mid-sternum area. Recent EGD 07/2022 indicating Esophageal phase Dysmotility and spastic distal esophagus/ lower esophageal sphincter.   PMH: Barrett's Esophagus, GERD, esophageal phase dysphagia, RA, PNA/COVID, compression fx s/p kyphoplasty, HLD, hypothyroidism, gout, CKD4, DVT, cancer (multiple myeloma), DDD.   CXR this admit: Cardiomegaly and borderline vascular congestion. Atelectatic type  opacity in the bilateral lungs - previously noted bibasilar and right lung opacities are improved, with some residual opacities in the mid to lower right lung and  lower left lung,.    OF NOTE: pt had a recent EGD 07/2022 stating it was "stretched" then but that she "shouldn't have any more endoscopies" but instead a "motility test".  Recommend was given to f/u w/ GI for further education and management of Esophageal phase Dysmotility last admit but Family has been unable to get pt to a GI appt per their report. Type of Study: Bedside Swallow Evaluation Previous Swallow Assessment: BSE 04/26/2022 - reg/thin, no follow up; BSE and tx 3/28-29/2024 - mech soft/regular diet/thins. GI f/u.  Diet Prior to this Study: Dysphagia 3 (mechanical soft);Thin liquids (Level 0) Temperature Spikes Noted: No (wbc 12.2) Respiratory Status: Room air History of Recent Intubation: No Behavior/Cognition: Alert;Cooperative;Pleasant mood;Distractible;Requires cueing Oral Cavity Assessment: Within Functional Limits Oral Care Completed by SLP: Recent completion by staff Oral Cavity - Dentition: Dentures, top;Dentures, bottom Vision: Functional for self-feeding Self-Feeding Abilities: Able  to feed self;Needs assist;Needs set up (weakness) Patient Positioning: Upright in bed Baseline Vocal Quality: Normal;Low vocal intensity Volitional Cough: Strong Volitional Swallow: Able to elicit    Oral/Motor/Sensory Function Overall Oral Motor/Sensory Function: Within functional limits   Ice Chips Ice chips: Not tested   Thin Liquid Thin Liquid: Within functional limits Presentation: Cup;Self Fed;Straw (~6-7 trials via each method)    Nectar Thick Nectar Thick Liquid: Not tested   Honey Thick Honey Thick Liquid: Not tested   Puree Puree: Within functional limits Presentation: Self Fed;Spoon (6+ trials)   Solid     Solid: Within functional limits (soft foods) Presentation: Spoon (Dtr supported - 4-5 trials)        Jerilynn Som, MS, CCC-SLP Speech Language Pathologist Rehab Services; [facility] [place] [phone] (ascom) Janelys Glassner 09/02/2022,3:17 PM

## 2022-09-02 NOTE — Progress Notes (Addendum)
Hypoglycemic Event  CBG: 44  Treatment: 8 oz juice/soda  Symptoms: None  Follow-up CBG: Time:0612 CBG Result:56  Possible Reasons for Event: Inadequate meal intake  Comments/MD notified:Katy Foust, NP    Chistian Kasler Blanche C Janalyn Higby

## 2022-09-02 NOTE — Assessment & Plan Note (Addendum)
Much improved.  Continue to hold Ditropan and Myrbetriq.  Decrease dose of Requip.

## 2022-09-03 DIAGNOSIS — D638 Anemia in other chronic diseases classified elsewhere: Secondary | ICD-10-CM

## 2022-09-03 DIAGNOSIS — E871 Hypo-osmolality and hyponatremia: Secondary | ICD-10-CM | POA: Insufficient documentation

## 2022-09-03 DIAGNOSIS — Z7189 Other specified counseling: Secondary | ICD-10-CM

## 2022-09-03 DIAGNOSIS — R7881 Bacteremia: Secondary | ICD-10-CM | POA: Diagnosis not present

## 2022-09-03 DIAGNOSIS — R5383 Other fatigue: Secondary | ICD-10-CM | POA: Diagnosis not present

## 2022-09-03 DIAGNOSIS — E162 Hypoglycemia, unspecified: Secondary | ICD-10-CM

## 2022-09-03 DIAGNOSIS — N179 Acute kidney failure, unspecified: Secondary | ICD-10-CM | POA: Diagnosis not present

## 2022-09-03 DIAGNOSIS — R55 Syncope and collapse: Secondary | ICD-10-CM | POA: Diagnosis not present

## 2022-09-03 DIAGNOSIS — N189 Chronic kidney disease, unspecified: Secondary | ICD-10-CM

## 2022-09-03 LAB — BASIC METABOLIC PANEL
Anion gap: 12 (ref 5–15)
BUN: 49 mg/dL — ABNORMAL HIGH (ref 8–23)
CO2: 16 mmol/L — ABNORMAL LOW (ref 22–32)
Calcium: 8.2 mg/dL — ABNORMAL LOW (ref 8.9–10.3)
Chloride: 103 mmol/L (ref 98–111)
Creatinine, Ser: 1.98 mg/dL — ABNORMAL HIGH (ref 0.44–1.00)
GFR, Estimated: 25 mL/min — ABNORMAL LOW (ref >60)
Glucose, Bld: 75 mg/dL (ref 70–99)
Potassium: 4.2 mmol/L (ref 3.5–5.1)
Sodium: 131 mmol/L — ABNORMAL LOW (ref 135–145)

## 2022-09-03 LAB — CBC
HCT: 23.2 % — ABNORMAL LOW (ref 36.0–46.0)
Hemoglobin: 7.2 g/dL — ABNORMAL LOW (ref 12.0–15.0)
MCH: 29.9 pg (ref 26.0–34.0)
MCHC: 31 g/dL (ref 30.0–36.0)
MCV: 96.3 fL (ref 80.0–100.0)
Platelets: 211 10*3/uL (ref 150–400)
RBC: 2.41 MIL/uL — ABNORMAL LOW (ref 3.87–5.11)
RDW: 18.5 % — ABNORMAL HIGH (ref 11.5–15.5)
WBC: 11 10*3/uL — ABNORMAL HIGH (ref 4.0–10.5)
nRBC: 0 % (ref 0.0–0.2)

## 2022-09-03 LAB — GLUCOSE, CAPILLARY
Glucose-Capillary: 66 mg/dL — ABNORMAL LOW (ref 70–99)
Glucose-Capillary: 82 mg/dL (ref 70–99)
Glucose-Capillary: 96 mg/dL (ref 70–99)

## 2022-09-03 LAB — PREPARE RBC (CROSSMATCH)

## 2022-09-03 LAB — CULTURE, BLOOD (ROUTINE X 2)

## 2022-09-03 LAB — TYPE AND SCREEN
ABO/RH(D): O POS
Unit division: 0

## 2022-09-03 MED ORDER — OLANZAPINE 10 MG PO TABS
10.0000 mg | ORAL_TABLET | Freq: Every day | ORAL | Status: DC
  Administered 2022-09-03 – 2022-09-06 (×4): 10 mg via ORAL
  Filled 2022-09-03 (×4): qty 1

## 2022-09-03 MED ORDER — SODIUM CHLORIDE 0.9% IV SOLUTION: Freq: Once | INTRAVENOUS | Status: AC

## 2022-09-03 MED ORDER — SODIUM BICARBONATE 650 MG PO TABS
650.0000 mg | ORAL_TABLET | Freq: Two times a day (BID) | ORAL | Status: DC
  Administered 2022-09-03 – 2022-09-04 (×2): 650 mg via ORAL
  Filled 2022-09-03 (×2): qty 1

## 2022-09-03 MED ORDER — ACETAMINOPHEN 325 MG PO TABS
650.0000 mg | ORAL_TABLET | Freq: Once | ORAL | Status: AC
  Administered 2022-09-03: 650 mg via ORAL
  Filled 2022-09-03: qty 2

## 2022-09-03 NOTE — Progress Notes (Signed)
PHARMACY CONSULT NOTE FOR:  OUTPATIENT  PARENTERAL ANTIBIOTIC THERAPY (OPAT) Patient on OPAT prior to admission, re-entering information  Indication: MSSA bacteremia and discitis Regimen: Cefazolin 2gm IV q12h End date: 09/17/2022  Please pull PIC at completion of IV antibiotics Weekly labs: CBC/diff, CMP, CRP, ESR Fax weekly lab results  promptly to [phone]  IV antibiotic discharge orders are pended. To discharging provider:  please sign these orders via discharge navigator,  Select New Orders & click on the button choice - Manage This Unsigned Work.     Thank you for allowing pharmacy to be a part of this patient's care.  Juliette Alcide, PharmD, BCPS, BCIDP Work Cell: [phone] 09/03/2022 8:27 AM

## 2022-09-03 NOTE — Assessment & Plan Note (Addendum)
With mental status improved hopefully she will eat better.  Advised she must eat even if she is not hungry or if it does not taste well.  Currently on D5.

## 2022-09-03 NOTE — Assessment & Plan Note (Addendum)
Last sodium normal range 

## 2022-09-03 NOTE — Progress Notes (Addendum)
Progress Note   Patient: Sydney Gillespie ZOX:[id number] DOB: [birth year] DOA: 09/01/2022     2 DOS: the patient was seen and examined on 09/03/2022   Brief hospital course: 82 year old female past medical history of recent MSSA bacteremia on cefazolin, multiple myeloma, chronic kidney disease stage IIIb, compression fracture status post kyphoplasty, hypertension, hyperlipidemia, rheumatoid arthritis presented with near syncope.  Mrs. Sydney Gillespie states that she had been up and walking around this morning when she reached up to grab something from a cabinet and started seeing spots. She subsequently lost consciousness. Mrs. Sydney Gillespie denies knowingly hitting her head. A home health aide found her on the ground and EMS was called. Patient's granddaughter at bedside states that over the last several weeks, patient has had intermittent dizziness that has been difficult for them to discern from her usual vertigo. She denies any prior loss of consciousness over the last few weeks. Mrs. Sydney Gillespie also endorses frequent falls but states that it is usually due to bilateral lower extremity weakness that is chronic and unchanged. She denies any known fevers, chills, abdominal pain, diarrhea, shortness of breath, cough, chest pain or palpitations. She notes one-time episode of nausea with vomiting yesterday with no further episodes of nausea afterwards.   4/24.  Patient lethargic this morning.  MRI of the brain negative for acute intracranial abnormality 4/25.  Patient's mental status much improved.  With hemoglobin dropping down to 7.2 we will give a unit of packed red blood cells.  Continue hydration for acute kidney injury.  Assessment and Plan: * Acute kidney injury superimposed on CKD Acute kidney injury on CKD stage IIIb.  Will continue gentle IV fluid hydration but will also give blood today.  Creatinine 2.23 on presentation.  Creatinine down to 1.98.  Baseline creatinine about 1.3.  Lethargy Much improved.   Continue to hold Ditropan and Myrbetriq.  Decreased dose of Requip and Zyprexa.  Syncope Check orthostatic vital signs when able to do so.  MSSA bacteremia Continue cefazolin as per ID.  SIRS (systemic inflammatory response syndrome) Presenting with hypothermia, hypotension, and elevated WBC.  Continue cefazolin as per ID.  So far blood cultures negative for 2 days.  Hypoglycemia With mental status improved hopefully she will eat better.  Hyponatremia Sodium 131.  Continue to monitor.  Anemia of chronic disease With ferritin up at 377.  Hemoglobin down to 7.2 with IV fluid hydration.  Transfusing 1 unit of packed red blood cells today.  History of DVT (deep vein thrombosis) During admission for COVID-19 in December 2023, patient was found to have a nonocclusive thrombus in the posterior tibial vein of the right calf.  Doppler in March 2024 demonstrated persistent DVT presence.  - Continue home Eliquis  Rheumatoid arthritis - Continue home Plaquenil  Hypothyroidism TSH elevated at 36.7.  Increased dose of levothyroxine.  Multiple myeloma Previously treated with daratumumab with plans to start Revlimid, but had to be placed on hold due to multiple admissions since December 2023.  - Continue outpatient follow-up with oncology  Palpitations Continue to monitor.        Subjective: Patient more awake today.  Able to follow commands.  Poor appetite.  Admitted with near syncope and acute kidney injury.  Physical Exam: Vitals:   09/03/22 0801 09/03/22 1147 09/03/22 1202 09/03/22 1332  BP: (!) 107/50 125/63 130/62 127/61  Pulse: 62 64 66 66  Resp: Temp: 97.6 F (36.4 C) 97.6 F (36.4 C) 97.8 F (36.6 C) 98 F (36.7  C)  TempSrc: Axillary Oral Oral Oral  SpO2: 100% 100% 100% 100%  Weight:       Physical Exam HENT:     Head: Normocephalic.     Mouth/Throat:     Pharynx: No oropharyngeal exudate.  Eyes:     General: Lids are normal.      Conjunctiva/sclera: Conjunctivae normal.  Cardiovascular:     Rate and Rhythm: Normal rate and regular rhythm.     Heart sounds: Normal heart sounds, S1 normal and S2 normal.  Pulmonary:     Breath sounds: Normal breath sounds. No decreased breath sounds, wheezing, rhonchi or rales.  Abdominal:     Palpations: Abdomen is soft.     Tenderness: There is no abdominal tenderness.  Musculoskeletal:     Right lower leg: No swelling.     Left lower leg: No swelling.  Skin:    General: Skin is warm.     Findings: No rash.  Neurological:     Mental Status: She is alert.     Comments: Able to straight leg raise     Data Reviewed: Creatinine 1.98 sodium 131, hemoglobin 7.2, white blood cell count 1.0, platelet count 211  Family Communication: Spoke with granddaughter on the phone  Disposition: Status is: Inpatient Remains inpatient appropriate because: Transfusing 1 unit of packed red blood cells today, continue hydration for acute kidney injury  Planned Discharge Destination: Rehab    Time spent: 28 minutes  Author: Alford Highland, MD 09/03/2022 2:26 PM  For on call review [email or web address].

## 2022-09-03 NOTE — [place] FL2 (Signed)
MEDICAID FL2 LEVEL OF CARE FORM     IDENTIFICATION  Patient Name: Sydney Gillespie Birthdate: 1940-11-16 Sex: female Admission Date (Current Location): 09/01/2022  [facility] and [place] Number:  Chiropodist and Address:         Provider Number: [id number]  Attending Physician Name and Address:  Alford Highland, MD  Relative Name and Phone Number:       Current Level of Care: Hospital Recommended Level of Care: Skilled Nursing Facility Prior Approval Number:    Date Approved/Denied:   PASRR Number: [id number]  Discharge Plan: ICF    Current Diagnoses: Patient Active Problem List   Diagnosis Date Noted   Hyponatremia 09/03/2022   Hypoglycemia 09/03/2022   Anemia of chronic disease 09/02/2022   Acute kidney injury superimposed on CKD 09/01/2022   SIRS (systemic inflammatory response syndrome) 09/01/2022   History of DVT (deep vein thrombosis) 09/01/2022   Discitis of thoracic region 08/10/2022   Vertebral osteomyelitis 08/10/2022   MSSA bacteremia 08/07/2022   CAP (community acquired pneumonia) 08/06/2022   Pneumonia 08/05/2022   Thoracic compression fracture, with delayed healing, subsequent encounter 07/29/2022   Acute deep vein thrombosis (DVT) of calf muscle vein of right lower extremity 05/10/2022   Compression fracture of body of thoracic vertebra 05/07/2022   Pneumonia involving right lung 05/07/2022   Elevated troponin 05/07/2022   Anemia of chronic kidney failure 04/27/2022   Esophageal stenosis 04/26/2022   COVID-19 virus infection 04/25/2022   Multiple myeloma 04/25/2022   Hypothyroidism 04/25/2022   Depression with anxiety 04/25/2022   Rheumatoid arthritis 04/25/2022   Iron deficiency anemia 04/25/2022   CKD (chronic kidney disease), stage IV 04/25/2022   Hyperparathyroidism due to renal insufficiency 01/29/2020   Neuropathy 10/11/2019   Restless leg syndrome 10/11/2019   Syncope 09/01/2018   Pneumonia due to COVID-19 virus  09/01/2018   Anemia due to stage 3 chronic kidney disease (HCC) 12/28/2016   Lumbar radiculopathy 06/18/2016   Lethargy 01/29/2016   Low serum vitamin D 01/29/2016   Vitamin D deficiency, unspecified 01/29/2016   Multiple myeloma not having achieved remission 12/18/2015   Spondylolisthesis of lumbar region 11/25/2015   Soft tissue lesion of shoulder region 09/11/2015   Acid reflux 09/11/2015   Lumbar and sacral osteoarthritis 07/22/2015   Degeneration of intervertebral disc of lumbar region 10/18/2013   Neuritis or radiculitis due to rupture of lumbar intervertebral disc 10/18/2013   Degenerative arthritis of lumbar spine 10/18/2013   Allergic rhinitis 09/27/2013   HLD (hyperlipidemia) 09/27/2013   Palpitations 09/27/2013   Gout 09/21/2013   Abnormal result of Mantoux test 09/21/2013   Impaired renal function 09/21/2013   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   OP (osteoporosis) 10/13/2011   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    Orientation RESPIRATION BLADDER Height & Weight     Self, Time, Situation, Place  Normal Incontinent Weight: 64 kg Height:     BEHAVIORAL SYMPTOMS/MOOD NEUROLOGICAL BOWEL NUTRITION STATUS      Continent Diet (dys 3)  AMBULATORY STATUS COMMUNICATION OF NEEDS Skin   Extensive Assist Verbally Normal                       Personal Care Assistance Level of Assistance              Functional Limitations Info             SPECIAL CARE FACTORS FREQUENCY  PT (By licensed PT), OT (By licensed OT)  Contractures Contractures Info: Not present    Additional Factors Info  Code Status, Allergies Code Status Info: full Allergies Info: isoniazid           Current Medications (09/03/2022):  This is the current hospital active medication list Current Facility-Administered Medications  Medication Dose Route Frequency Provider Last Rate Last Admin   acetaminophen (TYLENOL) tablet 650 mg  650 mg Oral Q6H PRN  Verdene Lennert, MD       Or   acetaminophen (TYLENOL) suppository 650 mg  650 mg Rectal Q6H PRN Verdene Lennert, MD       ALPRAZolam Prudy Feeler) tablet 0.25 mg  0.25 mg Oral Daily PRN Verdene Lennert, MD   0.25 mg at 09/01/22 2101   apixaban (ELIQUIS) tablet 5 mg  5 mg Oral BID Verdene Lennert, MD   5 mg at 09/03/22 9528   ceFAZolin (ANCEF) IVPB 2g/100 mL premix  2 g Intravenous Q12H Verdene Lennert, MD 200 mL/hr at 09/03/22 1112 2 g at 09/03/22 1112   Chlorhexidine Gluconate Cloth 2 % PADS 6 each  6 each Topical Daily Verdene Lennert, MD   6 each at 09/03/22 0833   dextrose 5 %-0.9 % sodium chloride infusion   Intravenous Continuous Alford Highland, MD 50 mL/hr at 09/03/22 4132 Infusion Verify at 09/03/22 4401   hydroxychloroquine (PLAQUENIL) tablet 200 mg  200 mg Oral Daily Verdene Lennert, MD   200 mg at 09/03/22 0272   hydroxychloroquine (PLAQUENIL) tablet 200 mg  200 mg Oral Once per day on Mon Tue Wed Thu Fri Sharen Hones, RPH   200 mg at 09/02/22 2117   ipratropium-albuterol (DUONEB) 0.5-2.5 (3) MG/3ML nebulizer solution 3 mL  3 mL Nebulization Q6H PRN Verdene Lennert, MD       iron polysaccharides (NIFEREX) capsule 150 mg  150 mg Oral Daily Verdene Lennert, MD   150 mg at 09/03/22 5366   levothyroxine (SYNTHROID) tablet 125 mcg  125 mcg Oral Q0600 Alford Highland, MD   125 mcg at 09/03/22 0518   OLANZapine (ZYPREXA) tablet 5 mg  5 mg Oral QHS Wieting, Richard, MD   5 mg at 09/02/22 2117   ondansetron (ZOFRAN) tablet 4 mg  4 mg Oral Q6H PRN Verdene Lennert, MD       Or   ondansetron (ZOFRAN) injection 4 mg  4 mg Intravenous Q6H PRN Verdene Lennert, MD       Oral care mouth rinse  15 mL Mouth Rinse PRN Verdene Lennert, MD       pantoprazole (PROTONIX) EC tablet 20 mg  20 mg Oral Daily Verdene Lennert, MD   20 mg at 09/03/22 4403   polyethylene glycol (MIRALAX / GLYCOLAX) packet 17 g  17 g Oral Daily Alford Highland, MD   17 g at 09/03/22 0831   rOPINIRole (REQUIP) tablet 0.25 mg  0.25  mg Oral QHS Wieting, Richard, MD   0.25 mg at 09/02/22 2117   sertraline (ZOLOFT) tablet 50 mg  50 mg Oral QPM Wieting, Richard, MD       sodium chloride flush (NS) 0.9 % injection 10-40 mL  10-40 mL Intracatheter Q12H Wieting, Richard, MD   10 mL at 09/03/22 0833   sodium chloride flush (NS) 0.9 % injection 3 mL  3 mL Intravenous Q12H Verdene Lennert, MD   3 mL at 09/03/22 4742     Discharge Medications: Please see discharge summary for a list of discharge medications.  Relevant Imaging Results:  Relevant Lab Results:   Additional Information ss  [id number]  Chapman Fitch, RN

## 2022-09-03 NOTE — Progress Notes (Signed)
Checked on patient, patient will follow-up with me outpatient as planned. GB  No charge.

## 2022-09-03 NOTE — Consult Note (Signed)
Consultation Note Date: 09/03/2022   Patient Name: Sydney Gillespie  DOB: Aug 22, 1940  MRN: [id number]  Age / Sex: 82 y.o., female  PCP: Barbette Reichmann, MD Referring Physician: Alford Highland, MD  Reason for Consultation: Establishing goals of care  HPI/Patient Profile: 82 year old female past medical history of recent MSSA bacteremia on cefazolin, multiple myeloma, chronic kidney disease stage IIIb, compression fracture status post kyphoplasty, hypertension, hyperlipidemia, rheumatoid arthritis presented with near syncope.   [name] states that she had been up and walking around this morning when she reached up to grab something from a cabinet and started seeing spots. She subsequently lost consciousness. [name] denies knowingly hitting her head. A home health aide found her on the ground and EMS was called. Patient's granddaughter at bedside states that over the last several weeks, patient has had intermittent dizziness that has been difficult for them to discern from her usual vertigo. She denies any prior loss of consciousness over the last few weeks. [name] also endorses frequent falls but states that it is usually due to bilateral lower extremity weakness that is chronic and unchanged. She denies any known fevers, chills, abdominal pain, diarrhea, shortness of breath, cough, chest pain or palpitations. She notes one-time episode of nausea with vomiting yesterday with no further episodes of nausea afterwards.   Clinical Assessment and Goals of Care:  Notes and labs reviewed.  In to see patient.  Patient is currently resting in bed at this time with her granddaughter sitting at bedside who is currently on the phone.  Patient states that I will need to speak with her granddaughter for any information.   Later called into room to speak to patient's granddaughter when she was available.  She  discusses that she and her grandmother live together and there is no other family assistance.  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  States that her grandmother's behavior at length is very different from what it has been prior to this admission.  Discussed reasons for this.  Granddaughter discusses discharge planning, and discusses questions.  Plans for family meeting tomorrow morning at bedside at 10:30 AM.  SUMMARY OF RECOMMENDATIONS   Plans for family meeting tomorrow morning at bedside at 10:30 AM.   Prognosis:  Unable to determine      Primary Diagnoses: Present on Admission:  Syncope  MSSA bacteremia  Hypothyroidism  Rheumatoid arthritis  Multiple myeloma  Palpitations  Lethargy   I have reviewed the medical record, interviewed the patient and family, and examined the patient. The following aspects are pertinent.  Past Medical History:  Diagnosis Date   Acute respiratory failure with hypoxia 05/07/2022   Acute respiratory failure with hypoxia 05/07/2022   Allergic rhinitis    Allergy    Anemia    Anemia    Barrett's esophagus    Blood dyscrasia    multiple myloma remission   Cancer    Change in bowel habits    Compression fracture 2013   Degenerative  disc disease, lumbar    Dysrhythmia    palpitations   Esophageal reflux    Family history of adverse reaction to anesthesia    nausea -mom   GERD (gastroesophageal reflux disease)    Gout    H/O bone marrow transplant    Heart palpitations    Hyperlipidemia    Hypothyroidism    Inflammatory polyarthropathy    Lumbago    Lumbar radiculitis    Lumbar stenosis with neurogenic claudication    Multiple myeloma    Multiple myeloma    Neuropathy    Bilateral Feet   Osteopenia    Osteoporosis    Other dysphagia    Palpitations    Pneumonia 04/2022   R/T COVID   Positive PPD    Renal insufficiency    Rheumatoid arthritis    Rheumatoid arthritis    Seasonal allergies     Tuberculosis 2011   Vertigo    Social History   Socioeconomic History   Marital status: Widowed    Spouse name: Not on file   Number of children: Not on file   Years of education: Not on file   Highest education level: Not on file  Occupational History   Not on file  Tobacco Use   Smoking status: Never   Smokeless tobacco: Never  Vaping Use   Vaping Use: Never used  Substance and Sexual Activity   Alcohol use: No   Drug use: No   Sexual activity: Yes  Other Topics Concern   Not on file  Social History Narrative   Not on file   Social Determinants of Health   Financial Resource Strain: Not on file  Food Insecurity: No Food Insecurity (08/05/2022)   Hunger Vital Sign    Worried About Running Out of Food in the Last Year: Never true    Ran Out of Food in the Last Year: Never true  Transportation Needs: No Transportation Needs (08/05/2022)   PRAPARE - Administrator, Civil Service (Medical): No    Lack of Transportation (Non-Medical): No  Physical Activity: Not on file  Stress: Not on file  Social Connections: Not on file   Family History  Problem Relation Age of Onset   Hypertension Mother    Heart attack Mother    Rheum arthritis Mother    Osteoarthritis Mother    Cancer Father    Hypertension Father    Stroke Father    Breast cancer Maternal Grandmother 40   Osteoarthritis Other    Rheum arthritis Other    Scheduled Meds:  apixaban  5 mg Oral BID   Chlorhexidine Gluconate Cloth  6 each Topical Daily   hydroxychloroquine  200 mg Oral Daily   hydroxychloroquine  200 mg Oral Once per day on Mon Tue Wed Thu Fri   iron polysaccharides  150 mg Oral Daily   levothyroxine  125 mcg Oral Q0600   OLANZapine  10 mg Oral QHS   pantoprazole  20 mg Oral Daily   polyethylene glycol  17 g Oral Daily   rOPINIRole  0.25 mg Oral QHS   sertraline  50 mg Oral QPM   sodium bicarbonate  650 mg Oral BID   sodium chloride flush  10-40 mL Intracatheter Q12H   sodium  chloride flush  3 mL Intravenous Q12H   Continuous Infusions:   ceFAZolin (ANCEF) IV 2 g (09/03/22 1112)   dextrose 5 % and 0.9% NaCl 50 mL/hr at 09/03/22 0822   PRN Meds:.acetaminophen **  OR** acetaminophen, ALPRAZolam, ipratropium-albuterol, ondansetron **OR** ondansetron (ZOFRAN) IV, mouth rinse Medications Prior to Admission:  Prior to Admission medications   Medication Sig Start Date End Date Taking? Authorizing Provider  allopurinol (ZYLOPRIM) 300 MG tablet Take 300 mg by mouth daily. 09/14/14  Yes [provider]  ALPRAZolam (XANAX) 0.25 MG tablet TAKE 1 TABLET(0.25 MG) BY MOUTH AT BEDTIME AS NEEDED FOR ANXIETY Patient taking differently: Take 0.25 mg by mouth as needed. Once daily for anxiety or sleep 04/24/22  Yes Earna Coder, MD  Ascorbic Acid (VITAMIN C) 1000 MG tablet Take 1,000 mg by mouth daily.   Yes [provider]  CALCIUM MAGNESIUM ZINC PO Take 1 tablet by mouth 2 (two) times daily. 1 am, 2 hs   Yes [provider]  ceFAZolin (ANCEF) 10 g injection  08/12/22  Yes [provider]  Cholecalciferol (VITAMIN D-3) 125 MCG (5000 UT) TABS Take 5,000 Units by mouth daily.   Yes [provider]  docusate sodium (COLACE) 100 MG capsule Take 1 capsule (100 mg total) by mouth 2 (two) times daily. 07/30/22  Yes Tressie Stalker, MD  DULoxetine (CYMBALTA) 20 MG capsule Take 20 mg by mouth 2 (two) times daily. 10/02/20  Yes [provider]  ELIQUIS 5 MG TABS tablet TAKE 1 TABLET(5 MG) BY MOUTH TWICE DAILY Patient taking differently: Take 5 mg by mouth 2 (two) times daily. 08/03/22  Yes Earna Coder, MD  hydroxychloroquine (PLAQUENIL) 200 MG tablet Take 200 mg by mouth See admin instructions. Patient currently takes 200 mg twice daily Monday through Friday and 200 mg once daily Saturday and Sunday. 10/25/18  Yes [provider]  Ipratropium-Albuterol (COMBIVENT) 20-100 MCG/ACT AERS respimat Inhale 1 puff into the lungs  every 6 (six) hours as needed for wheezing. 05/10/22  Yes Esaw Grandchild A, DO  iron polysaccharides (NIFEREX) 150 MG capsule Take 1 capsule (150 mg total) by mouth daily. 08/13/22  Yes Esaw Grandchild A, DO  lansoprazole (PREVACID) 30 MG capsule Take 1 capsule (30 mg total) by mouth 2 (two) times daily before a meal. 06/12/22  Yes Earna Coder, MD  levothyroxine (SYNTHROID) 112 MCG tablet Take 112 mcg by mouth daily before breakfast. 01/16/22  Yes [provider]  MYRBETRIQ 50 MG TB24 tablet Take 50 mg by mouth daily.   Yes [provider]  nystatin-triamcinolone (MYCOLOG II) cream Apply 1 Application topically 2 (two) times daily as needed (itching). 08/24/22  Yes [provider]  oxybutynin (DITROPAN-XL) 10 MG 24 hr tablet Take 1 tablet (10 mg total) by mouth daily. 10/21/20  Yes MacDiarmid, Lorin Picket, MD  Potassium 99 MG TABS Take 1 tablet by mouth daily.   Yes [provider]  senna-docusate (SENOKOT-S) 8.6-50 MG tablet Take 1 tablet by mouth 2 (two) times daily. Hold if having loose or frequent stools. Patient taking differently: Take 1 tablet by mouth 2 (two) times daily as needed for mild constipation. Hold if having loose or frequent stools. 08/12/22  Yes Esaw Grandchild A, DO  sertraline (ZOLOFT) 50 MG tablet Take 1 tablet (50 mg total) by mouth daily. 08/17/22  Yes Earna Coder, MD  Spacer/Aero-Holding Deretha Emory DEVI 1 each by Does not apply route as needed. 05/10/22  Yes Esaw Grandchild A, DO  tiZANidine (ZANAFLEX) 2 MG tablet Take 1 tablet (2 mg total) by mouth 2 (two) times daily as needed for muscle spasms. 08/12/22  Yes Pennie Banter, DO  triamterene-hydrochlorothiazide (MAXZIDE-25) 37.5-25 MG tablet Take 1 tablet by  mouth daily.   Yes [provider]  vitamin B-12 (CYANOCOBALAMIN) 1000 MCG tablet Take 1,000 mcg by mouth daily. 01/03/20  Yes [provider]  zinc gluconate 50 MG tablet Take 50 mg by mouth daily.   Yes [provider]  atenolol (TENORMIN) 50 MG tablet Take 1 tablet (50 mg total) by mouth daily. Patient taking differently: Take 50 mg by mouth at bedtime. 08/13/22   Esaw Grandchild A, DO  OLANZapine (ZYPREXA) 10 MG tablet Take 1 tablet (10 mg total) by mouth at bedtime. 07/20/22   Earna Coder, MD  rOPINIRole (REQUIP) 0.5 MG tablet Take 0.5 mg by mouth at bedtime. 10/02/20   [provider]   Allergies  Allergen Reactions   Isoniazid Other (See Comments)    Blisters    Review of Systems  All other systems reviewed and are negative.   Physical Exam Pulmonary:     Effort: Pulmonary effort is normal.  Neurological:     Mental Status: She is alert.     Vital Signs: BP 129/62 (BP Location: Left Arm)   Pulse 65   Temp 97.9 F (36.6 C)   Resp 18   Wt 64 kg   SpO2 100%   BMI 23.48 kg/m  Pain Scale: 0-10   Pain Score: 0-No pain   SpO2: SpO2: 100 % O2 Device:SpO2: 100 % O2 Flow Rate: .   IO: Intake/output summary:  Intake/Output Summary (Last 24 hours) at 09/03/2022 1613 Last data filed at 09/03/2022 1330 Gross per 24 hour  Intake 2068.38 ml  Output --  Net 2068.38 ml    LBM:   Baseline Weight: Weight: 55 kg Most recent weight: Weight: 64 kg       Signed by: Morton Stall, NP   Please contact Palliative Medicine Team phone at [phone] for questions and concerns.  For individual provider: See Loretha Stapler

## 2022-09-03 NOTE — Progress Notes (Signed)
Physical Therapy Treatment Patient Details Name: Sydney Gillespie MRN: [id number] DOB: 12-04-1940 Today's Date: 09/03/2022   History of Present Illness 82 year old female past medical history of recent MSSA bacteremia on cefazolin, multiple myeloma, chronic kidney disease stage IIIb, compression fracture status post kyphoplasty, hypertension, hyperlipidemia, rheumatoid arthritis presented with near syncope.    PT Comments    Patient received in bed, granddaughter had just arrived and woke her up. Patient requires encouragement to participate this session. She needs min A to sit up in bed and min/mod A to stand. Patient is able to walk around room with RW and min A. Poor safety awareness needing multimodal cues for safety. Patient will continue to benefit from skilled PT to improve strength, endurance and safety with mobility.     Recommendations for follow up therapy are one component of a multi-disciplinary discharge planning process, led by the attending physician.  Recommendations may be updated based on patient status, additional functional criteria and insurance authorization.  Follow Up Recommendations  Can patient physically be transported by private vehicle: No    Assistance Recommended at Discharge Frequent or constant Supervision/Assistance  Patient can return home with the following A lot of help with walking and/or transfers;A lot of help with bathing/dressing/bathroom   Equipment Recommendations  None recommended by PT    Recommendations for Other Services       Precautions / Restrictions Precautions Precautions: Fall Restrictions Weight Bearing Restrictions: No     Mobility  Bed Mobility Overal bed mobility: Needs Assistance Bed Mobility: Supine to Sit     Supine to sit: Min assist Sit to supine: Supervision        Transfers Overall transfer level: Needs assistance Equipment used: Rolling walker (2 wheels) Transfers: Sit to/from Stand Sit to Stand: Mod  assist           General transfer comment: cues for safe hand placement.    Ambulation/Gait Ambulation/Gait assistance: Min assist Gait Distance (Feet): 25 Feet Assistive device: Rolling walker (2 wheels) Gait Pattern/deviations: Step-through pattern, Decreased step length - right, Decreased step length - left Gait velocity: almost too fast at times.     General Gait Details: Patient ambulated around bed to [facility] then to door and back to bed with RW. Needs min A for safety and cues for safety. Poor awareness.   Stairs             Wheelchair Mobility    Modified Rankin (Stroke Patients Only)       Balance Overall balance assessment: Needs assistance Sitting-balance support: Feet supported Sitting balance-Leahy Scale: Good     Standing balance support: Bilateral upper extremity supported, During functional activity, Reliant on assistive device for balance Standing balance-Leahy Scale: Poor Standing balance comment: Requires Rw and external assist for safety                            Cognition Arousal/Alertness: Lethargic Behavior During Therapy: WFL for tasks assessed/performed Overall Cognitive Status: Impaired/Different from baseline Area of Impairment: Safety/judgement, Awareness, Problem solving, Memory                     Memory: Decreased short-term memory   Safety/Judgement: Decreased awareness of deficits, Decreased awareness of safety Awareness: Intellectual Problem Solving: Slow processing, Requires verbal cues, Requires tactile cues, Decreased initiation General Comments: Patient is generally quite lethargic in the am per granddaughter. PM is better for her.  Exercises      General Comments        Pertinent Vitals/Pain Pain Assessment Pain Assessment: No/denies pain    Home Living                          Prior Function            PT Goals (current goals can now be found in the care plan  section) Acute Rehab PT Goals Patient Stated Goal: to get stronger PT Goal Formulation: With family Time For Goal Achievement: 09/08/22 Progress towards PT goals: Progressing toward goals    Frequency    Min 3X/week      PT Plan Current plan remains appropriate    Co-evaluation              AM-PAC PT "6 Clicks" Mobility   Outcome Measure  Help needed turning from your back to your side while in a flat bed without using bedrails?: A Little Help needed moving from lying on your back to sitting on the side of a flat bed without using bedrails?: A Lot Help needed moving to and from a bed to a chair (including a wheelchair)?: A Little Help needed standing up from a chair using your arms (e.g., wheelchair or bedside chair)?: A Lot Help needed to walk in hospital room?: A Little Help needed climbing 3-5 steps with a railing? : Total 6 Click Score: 14    End of Session Equipment Utilized During Treatment: Gait belt Activity Tolerance: Patient limited by fatigue Patient left: in bed;with call bell/phone within reach;with bed alarm set;with family/visitor present Nurse Communication: Mobility status PT Visit Diagnosis: Muscle weakness (generalized) (M62.81);Other abnormalities of gait and mobility (R26.89);Difficulty in walking, not elsewhere classified (R26.2);Unsteadiness on feet (R26.81);Repeated falls (R29.6)     Time: 4403-4742 PT Time Calculation (min) (ACUTE ONLY): 26 min  Charges:  $Gait Training: 23-37 mins                     Akia Montalban, PT, GCS 09/03/22,2:27 PM

## 2022-09-03 NOTE — TOC Initial Note (Signed)
Transition of Care [facility]) - Initial/Assessment Note    Patient Details  Name: Sydney Gillespie MRN: [id number] Date of Birth: 1940/06/17  Transition of Care [facility]) CM/SW Contact:    Chapman Fitch, RN Phone Number: 09/03/2022, 3:48 PM  Clinical Narrative:                  Admitted for: lethargy Admitted from: home with granddaughter [name] PCP: Marcello Fennel Pharmacy: [facility]  Current home health/prior home health/DME: RW and shower seat  Patient on IV Cefazolin till 5/9  Active with [facility] notified Barbara Cower of admission  Therapy recommending SNF. Patient and granddaughter in agreement.  Does not want [facility] obtained Fl2 sent for signature Bed search initiated  Granddaughter would like assistance with private pay caregivers in the home after STR.  Agreeable to referral to [facility]. Referral made to [facility] with [facility]   Expected Discharge Plan: Skilled Nursing Facility Barriers to Discharge: Continued Medical Work up   Patient Goals and CMS Choice            Expected Discharge Plan and Services       Living arrangements for the past 2 months: Single Family Home                                      Prior Living Arrangements/Services Living arrangements for the past 2 months: Single Family Home Lives with:: Relatives                   Activities of Daily Living      Permission Sought/Granted                  Emotional Assessment              Admission diagnosis:  Syncope [R55] Weakness [R53.1] AKI (acute kidney injury) [N17.9] Near syncope [R55] Hypothermia, initial encounter [T68.XXXA] Patient Active Problem List   Diagnosis Date Noted   Hyponatremia 09/03/2022   Hypoglycemia 09/03/2022   Anemia of chronic disease 09/02/2022   Acute kidney injury superimposed on CKD 09/01/2022   SIRS (systemic inflammatory response syndrome) 09/01/2022   History of DVT (deep vein  thrombosis) 09/01/2022   Discitis of thoracic region 08/10/2022   Vertebral osteomyelitis 08/10/2022   MSSA bacteremia 08/07/2022   CAP (community acquired pneumonia) 08/06/2022   Pneumonia 08/05/2022   Thoracic compression fracture, with delayed healing, subsequent encounter 07/29/2022   Acute deep vein thrombosis (DVT) of calf muscle vein of right lower extremity 05/10/2022   Compression fracture of body of thoracic vertebra 05/07/2022   Pneumonia involving right lung 05/07/2022   Elevated troponin 05/07/2022   Anemia of chronic kidney failure 04/27/2022   Esophageal stenosis 04/26/2022   COVID-19 virus infection 04/25/2022   Multiple myeloma 04/25/2022   Hypothyroidism 04/25/2022   Depression with anxiety 04/25/2022   Rheumatoid arthritis 04/25/2022   Iron deficiency anemia 04/25/2022   CKD (chronic kidney disease), stage IV 04/25/2022   Hyperparathyroidism due to renal insufficiency 01/29/2020   Neuropathy 10/11/2019   Restless leg syndrome 10/11/2019   Syncope 09/01/2018   Pneumonia due to COVID-19 virus 09/01/2018   Anemia due to stage 3 chronic kidney disease (HCC) 12/28/2016   Lumbar radiculopathy 06/18/2016   Lethargy 01/29/2016   Low serum vitamin D 01/29/2016   Vitamin D deficiency, unspecified 01/29/2016   Multiple myeloma not having achieved remission  12/18/2015   Spondylolisthesis of lumbar region 11/25/2015   Soft tissue lesion of shoulder region 09/11/2015   Acid reflux 09/11/2015   Lumbar and sacral osteoarthritis 07/22/2015   Degeneration of intervertebral disc of lumbar region 10/18/2013   Neuritis or radiculitis due to rupture of lumbar intervertebral disc 10/18/2013   Degenerative arthritis of lumbar spine 10/18/2013   Allergic rhinitis 09/27/2013   HLD (hyperlipidemia) 09/27/2013   Palpitations 09/27/2013   Gout 09/21/2013   Abnormal result of Mantoux test 09/21/2013   Impaired renal function 09/21/2013   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   OP  (osteoporosis) 10/13/2011   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   PCP:  Barbette Reichmann, MD Pharmacy:   [facility] [phone] Dan Humphreys, [place] - [address] AT [facility] [address] [place] [address] Phone: [phone] Fax: [phone]  [facility] [address] [place] [address] Phone: [phone] Fax: [phone]  [facility], [place] - [address] [address] [place] [address] Phone: [phone] Fax: [phone]     Social Determinants of Health (SDOH) Social History: SDOH Screenings   Food Insecurity: No Food Insecurity (08/05/2022)  Housing: Low Risk  (08/05/2022)  Transportation Needs: No Transportation Needs (08/05/2022)  Utilities: Not At Risk (08/05/2022)  Tobacco Use: Low Risk  (08/17/2022)   SDOH Interventions:     Readmission Risk Interventions    09/03/2022    3:44 PM 08/06/2022    3:47 PM 05/10/2022    2:56 PM  Readmission Risk Prevention Plan  Transportation Screening Complete Complete Complete  Medication Review (RN Care Manager) Complete Complete Complete  PCP or Specialist appointment within 3-5 days of discharge  Complete Complete  HRI or Home Care Consult Complete  Complete  SW Recovery Care/Counseling Consult Complete Complete Complete  Palliative Care Screening Complete Not Applicable Not Applicable  Skilled Nursing Facility Complete Not Applicable Not Applicable

## 2022-09-04 ENCOUNTER — Encounter: Payer: Self-pay | Admitting: Internal Medicine

## 2022-09-04 DIAGNOSIS — N179 Acute kidney failure, unspecified: Secondary | ICD-10-CM | POA: Diagnosis not present

## 2022-09-04 DIAGNOSIS — N189 Chronic kidney disease, unspecified: Secondary | ICD-10-CM | POA: Diagnosis not present

## 2022-09-04 DIAGNOSIS — I951 Orthostatic hypotension: Secondary | ICD-10-CM | POA: Diagnosis not present

## 2022-09-04 DIAGNOSIS — Z7189 Other specified counseling: Secondary | ICD-10-CM | POA: Diagnosis not present

## 2022-09-04 DIAGNOSIS — E872 Acidosis, unspecified: Secondary | ICD-10-CM | POA: Diagnosis not present

## 2022-09-04 DIAGNOSIS — E162 Hypoglycemia, unspecified: Secondary | ICD-10-CM

## 2022-09-04 DIAGNOSIS — R7881 Bacteremia: Secondary | ICD-10-CM | POA: Diagnosis not present

## 2022-09-04 DIAGNOSIS — E871 Hypo-osmolality and hyponatremia: Secondary | ICD-10-CM

## 2022-09-04 LAB — BASIC METABOLIC PANEL
Anion gap: 14 (ref 5–15)
BUN: 42 mg/dL — ABNORMAL HIGH (ref 8–23)
CO2: 16 mmol/L — ABNORMAL LOW (ref 22–32)
Calcium: 8.1 mg/dL — ABNORMAL LOW (ref 8.9–10.3)
Chloride: 105 mmol/L (ref 98–111)
Creatinine, Ser: 1.71 mg/dL — ABNORMAL HIGH (ref 0.44–1.00)
GFR, Estimated: 30 mL/min — ABNORMAL LOW (ref >60)
Glucose, Bld: 64 mg/dL — ABNORMAL LOW (ref 70–99)
Potassium: 4.1 mmol/L (ref 3.5–5.1)
Sodium: 135 mmol/L (ref 135–145)

## 2022-09-04 LAB — CBC
HCT: 28.2 % — ABNORMAL LOW (ref 36.0–46.0)
Hemoglobin: 9.1 g/dL — ABNORMAL LOW (ref 12.0–15.0)
MCH: 30.3 pg (ref 26.0–34.0)
MCHC: 32.3 g/dL (ref 30.0–36.0)
MCV: 94 fL (ref 80.0–100.0)
Platelets: 214 10*3/uL (ref 150–400)
RBC: 3 MIL/uL — ABNORMAL LOW (ref 3.87–5.11)
RDW: 18.1 % — ABNORMAL HIGH (ref 11.5–15.5)
WBC: 10.2 10*3/uL (ref 4.0–10.5)
nRBC: 0.2 % (ref 0.0–0.2)

## 2022-09-04 LAB — BPAM RBC
Blood Product Expiration Date: 202405232359
ISSUE DATE / TIME: 202404251136
Unit Type and Rh: 5100

## 2022-09-04 LAB — GLUCOSE, CAPILLARY
Glucose-Capillary: 58 mg/dL — ABNORMAL LOW (ref 70–99)
Glucose-Capillary: 62 mg/dL — ABNORMAL LOW (ref 70–99)
Glucose-Capillary: 63 mg/dL — ABNORMAL LOW (ref 70–99)
Glucose-Capillary: 123 mg/dL — ABNORMAL HIGH (ref 70–99)
Glucose-Capillary: 78 mg/dL (ref 70–99)

## 2022-09-04 LAB — TYPE AND SCREEN: Antibody Screen: NEGATIVE

## 2022-09-04 MED ORDER — ENSURE ENLIVE PO LIQD
237.0000 mL | Freq: Three times a day (TID) | ORAL | Status: DC
  Administered 2022-09-04 – 2022-09-07 (×9): 237 mL via ORAL

## 2022-09-04 MED ORDER — ADULT MULTIVITAMIN W/MINERALS CH
1.0000 | ORAL_TABLET | Freq: Every day | ORAL | Status: DC
  Administered 2022-09-05 – 2022-09-07 (×3): 1 via ORAL
  Filled 2022-09-04 (×3): qty 1

## 2022-09-04 MED ORDER — DEXTROSE 50 % IV SOLN
12.5000 g | Freq: Once | INTRAVENOUS | Status: AC
  Administered 2022-09-04: 12.5 g via INTRAVENOUS

## 2022-09-04 MED ORDER — LEVOTHYROXINE SODIUM 137 MCG PO TABS
137.0000 ug | ORAL_TABLET | Freq: Every day | ORAL | Status: DC
  Administered 2022-09-05 – 2022-09-07 (×3): 137 ug via ORAL
  Filled 2022-09-04 (×3): qty 1

## 2022-09-04 MED ORDER — MIDODRINE HCL 5 MG PO TABS
5.0000 mg | ORAL_TABLET | Freq: Three times a day (TID) | ORAL | Status: DC
  Administered 2022-09-04 – 2022-09-07 (×10): 5 mg via ORAL
  Filled 2022-09-04 (×10): qty 1

## 2022-09-04 MED ORDER — DEXTROSE 50 % IV SOLN
INTRAVENOUS | Status: AC
  Filled 2022-09-04: qty 50

## 2022-09-04 MED ORDER — SODIUM BICARBONATE 650 MG PO TABS
650.0000 mg | ORAL_TABLET | Freq: Three times a day (TID) | ORAL | Status: DC
  Administered 2022-09-04 (×2): 650 mg via ORAL
  Filled 2022-09-04 (×2): qty 1

## 2022-09-04 MED ORDER — CHLORHEXIDINE GLUCONATE CLOTH 2 % EX PADS
6.0000 | MEDICATED_PAD | Freq: Every day | CUTANEOUS | Status: DC
  Administered 2022-09-04 – 2022-09-07 (×3): 6 via TOPICAL

## 2022-09-04 NOTE — Progress Notes (Addendum)
Daily Progress Note   Patient Name: Sydney Gillespie       Date: 09/04/2022 DOB: [birth year]  Age: 82 y.o. MRN#: [id number] Attending Physician: Alford Highland, MD Primary Care Physician: Barbette Reichmann, MD Admit Date: 09/01/2022  Reason for Consultation/Follow-up: Establishing goals of care  Subjective: Notes and labs reviewed. In to see patient, and granddaughter is at bedside along with a close friend. She is able to tell me her name, location, year, president, and why she is here.   They discuss her decline since December both physically and psychologically. She discusses being widowed for 2 years, her pet dying, and another family member dying as well. She states she is not hungry at baseline, and has difficulty swallowing due to diagnosed esophageal dysmotility. Granddaughter lives with her. Patient has a son who lives 2 hours away. There are other grandchildren.    We discussed her diagnosis, prognosis, GOC, EOL wishes disposition and options.  Created space and opportunity for patient  to explore thoughts and feelings regarding current medical information.   A detailed discussion was had today regarding advanced directives.  Concepts specific to code status, artifical feeding and hydration, IV antibiotics and rehospitalization were discussed.  The difference between an aggressive medical intervention path and a comfort care path was discussed.  Values and goals of care important to patient and family were attempted to be elicited.  Discussed limitations of medical interventions to prolong quality of life in some situations and discussed the concept of human mortality.  They will consider thoughts on code status and other decisions; they state they need time to process. They would  like to consider initiating PACE.  Patient and granddaughter would like AD completed and patient would want granddaughter to be HPOA.   Granddaughter followed me out of room. She discusses dynamics and her concerns and fears. Discussed hospice.    Length of Stay: 3  Current Medications: Scheduled Meds:   apixaban  5 mg Oral BID   Chlorhexidine Gluconate Cloth  6 each Topical Q0600   dextrose       feeding supplement  237 mL Oral TID BM   hydroxychloroquine  200 mg Oral Daily   hydroxychloroquine  200 mg Oral Once per day on Mon Tue Wed Thu Fri   iron polysaccharides  150 mg Oral Daily  levothyroxine  125 mcg Oral Q0600   midodrine  5 mg Oral TID WC   OLANZapine  10 mg Oral QHS   pantoprazole  20 mg Oral Daily   polyethylene glycol  17 g Oral Daily   rOPINIRole  0.25 mg Oral QHS   sertraline  50 mg Oral QPM   sodium bicarbonate  650 mg Oral TID   sodium chloride flush  10-40 mL Intracatheter Q12H   sodium chloride flush  3 mL Intravenous Q12H    Continuous Infusions:   ceFAZolin (ANCEF) IV 2 g (09/04/22 0857)   dextrose 5 % and 0.9% NaCl 50 mL/hr at 09/04/22 0229    PRN Meds: acetaminophen **OR** acetaminophen, ALPRAZolam, dextrose, ipratropium-albuterol, ondansetron **OR** ondansetron (ZOFRAN) IV, mouth rinse  Physical Exam Pulmonary:     Effort: Pulmonary effort is normal.  Neurological:     Mental Status: She is alert.             Vital Signs: BP (!) 89/60 (BP Location: Right Arm) Comment: Primary RN aware.  Pulse 74   Temp 98.3 F (36.8 C) (Oral)   Resp 20   Wt 65.5 kg   SpO2 94%   BMI 24.03 kg/m  SpO2: SpO2: 94 % O2 Device: O2 Device: Room Air O2 Flow Rate:    Intake/output summary:  Intake/Output Summary (Last 24 hours) at 09/04/2022 1422 Last data filed at 09/04/2022 1350 Gross per 24 hour  Intake 898 ml  Output --  Net 898 ml   LBM: Last BM Date : 09/01/22 Baseline Weight: Weight: 55 kg Most recent weight: Weight: 65.5 kg    Patient Active  Problem List   Diagnosis Date Noted   Hyponatremia 09/03/2022   Hypoglycemia 09/03/2022   Anemia of chronic disease 09/02/2022   Acute kidney injury superimposed on CKD (HCC) 09/01/2022   SIRS (systemic inflammatory response syndrome) (HCC) 09/01/2022   History of DVT (deep vein thrombosis) 09/01/2022   Discitis of thoracic region 08/10/2022   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   MSSA bacteremia 08/07/2022   CAP (community acquired pneumonia) 08/06/2022   Pneumonia 08/05/2022   Thoracic compression fracture, with delayed healing, subsequent encounter 07/29/2022   Acute deep vein thrombosis (DVT) of calf muscle vein of right lower extremity (HCC) 05/10/2022   Compression fracture of body of thoracic vertebra (HCC) 05/07/2022   Pneumonia involving right lung 05/07/2022   Elevated troponin 05/07/2022   Anemia of chronic kidney failure 04/27/2022   Esophageal stenosis 04/26/2022   COVID-19 virus infection 04/25/2022   Multiple myeloma (HCC) 04/25/2022   Hypothyroidism 04/25/2022   Depression with anxiety 04/25/2022   Rheumatoid arthritis (HCC) 04/25/2022   Iron deficiency anemia 04/25/2022   CKD (chronic kidney disease), stage IV (HCC) 04/25/2022   Hyperparathyroidism due to renal insufficiency (HCC) 01/29/2020   Neuropathy 10/11/2019   Restless leg syndrome 10/11/2019   Syncope 09/01/2018   Pneumonia due to COVID-19 virus 09/01/2018   Anemia due to stage 3 chronic kidney disease (HCC) 12/28/2016   Lumbar radiculopathy 06/18/2016   Lethargy 01/29/2016   Low serum vitamin D 01/29/2016   Vitamin D deficiency, unspecified 01/29/2016   Multiple myeloma not having achieved remission (HCC) 12/18/2015   Spondylolisthesis of lumbar region 11/25/2015   Soft tissue lesion of shoulder region 09/11/2015   Acid reflux 09/11/2015   Lumbar and sacral osteoarthritis 07/22/2015   Degeneration of intervertebral disc of lumbar region 10/18/2013   Neuritis or radiculitis due to rupture of  lumbar intervertebral disc 10/18/2013   Degenerative  arthritis of lumbar spine 10/18/2013   Allergic rhinitis 09/27/2013   HLD (hyperlipidemia) 09/27/2013   Palpitations 09/27/2013   Gout 09/21/2013   Abnormal result of Mantoux test 09/21/2013   Impaired renal function 09/21/2013   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   OP (osteoporosis) 10/13/2011   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    Palliative Care Assessment & Plan    Recommendations/Plan: Continue current care.   Patient and granddaughter discussing code status and other decisions.    Code Status:    Code Status Orders  (From admission, onward)           Start     Ordered   09/01/22 1559  Full code  Continuous       Question:  By:  Answer:  Consent: discussion documented in EHR   09/01/22 1601           Code Status History     Date Active Date Inactive Code Status Order ID Comments User Context   08/05/2022 1215 08/12/2022 2059 Full Code [id number]  Floydene Flock, MD ED   07/29/2022 1947 07/30/2022 1705 Full Code [id number]  Tressie Stalker, MD Inpatient   05/07/2022 0118 05/10/2022 2236 Full Code [id number]  Andris Baumann, MD ED   04/25/2022 0929 04/27/2022 1727 Full Code [id number]  Lorretta Harp, MD ED   09/01/2018 1541 09/05/2018 1821 Full Code [id number]  Lorin Glass, MD Inpatient   06/18/2016 1532 06/19/2016 1452 Full Code [id number]  Tressie Stalker, MD Inpatient   11/25/2015 1305 11/27/2015 2137 Full Code [id number]  Tressie Stalker, MD Inpatient       Prognosis:  Poor overall   Thank you for allowing the Palliative Medicine Team to assist in the care of this patient.    Morton Stall, NP  Please contact Palliative Medicine Team phone at [phone] for questions and concerns.

## 2022-09-04 NOTE — Progress Notes (Signed)
Progress Note   Patient: Sydney Gillespie ZOX:[id number] DOB: 1941-03-18 DOA: 09/01/2022     3 DOS: the patient was seen and examined on 09/04/2022   Brief hospital course: 82 year old female past medical history of recent MSSA bacteremia on cefazolin, multiple myeloma, chronic kidney disease stage IIIb, compression fracture status post kyphoplasty, hypertension, hyperlipidemia, rheumatoid arthritis presented with near syncope.  Sydney Gillespie states that she had been up and walking around this morning when she reached up to grab something from a cabinet and started seeing spots. She subsequently lost consciousness. Sydney Gillespie denies knowingly hitting her head. A home health aide found her on the ground and EMS was called. Patient's granddaughter at bedside states that over the last several weeks, patient has had intermittent dizziness that has been difficult for them to discern from her usual vertigo. She denies any prior loss of consciousness over the last few weeks. Sydney Gillespie also endorses frequent falls but states that it is usually due to bilateral lower extremity weakness that is chronic and unchanged. She denies any known fevers, chills, abdominal pain, diarrhea, shortness of breath, cough, chest pain or palpitations. She notes one-time episode of nausea with vomiting yesterday with no further episodes of nausea afterwards.   4/24.  Patient lethargic this morning.  MRI of the brain negative for acute intracranial abnormality 4/25.  Patient's mental status much improved.  With hemoglobin dropping down to 7.2 we will give a unit of packed red blood cells.  Continue hydration for acute kidney injury.  Sodium bicarb tablets added. 4/26.  Hemoglobin responded to blood transfusion with hemoglobin up at 9.1.  Sodium bicarb still low at 16 and will increase sodium bicarb tablets to 3 times daily dosing.  Patient orthostatic today so midodrine added.  Patient again had low sugar this morning.  Assessment  and Plan: * Acute kidney injury superimposed on CKD (HCC) Acute kidney injury on CKD stage IIIb.  Will continue gentle IV fluid hydration.  Creatinine 2.23 on presentation.  Creatinine down to 1.71.  Baseline creatinine about 1.3.  Metabolic acidosis Likely secondary to acute kidney injury.  Continue sodium bicarb tablets and increase to 3 times daily.  Orthostatic hypotension Start midodrine.  Continue IV fluids.  Hypoglycemia With mental status improved hopefully she will eat better.  Advised she must eat even if she is not hungry or if it does not taste well.  Currently on D5.  MSSA bacteremia Continue cefazolin as per ID.  SIRS (systemic inflammatory response syndrome) (HCC) Presenting with hypothermia, hypotension, and elevated WBC.  Continue cefazolin as per ID.  So far blood cultures negative for 2 days.  Lethargy Much improved.  Continue to hold Ditropan and Myrbetriq.  Decrease dose of Requip.  Syncope Secondary to orthostatic hypotension.  Hyponatremia Last sodium normal range  Anemia of chronic disease With ferritin up at 377.  Responded to blood transfusion for hemoglobin of 7.2.  Last hemoglobin 9.1.  History of DVT (deep vein thrombosis) During admission for COVID-19 in December 2023, patient was found to have a nonocclusive thrombus in the posterior tibial vein of the right calf.  Doppler in March 2024 demonstrated persistent DVT presence.  - Continue home Eliquis  Rheumatoid arthritis (HCC) - Continue home Plaquenil  Hypothyroidism TSH elevated at 36.7.  Increased dose of levothyroxine to 137 mcg.  Multiple myeloma (HCC) Previously treated with daratumumab with plans to start Revlimid, but had to be placed on hold due to multiple admissions since December 2023.  - Continue outpatient  follow-up with oncology  Palpitations Continue to monitor.        Subjective: Patient orthostatic today.  Patient also had a low sugar again this morning.  Patient  still with poor appetite.  Admitted with near syncope, acute kidney injury.  Physical Exam: Vitals:   09/04/22 0918 09/04/22 0920 09/04/22 0921 09/04/22 1423  BP: 108/72 (!) 89/60 (!) 89/60 128/66  Pulse: 78 74 74 70  Resp:    16  Temp:    98 F (36.7 C)  TempSrc:      SpO2: 98% 94% 94% 100%  Weight:       Physical Exam HENT:     Head: Normocephalic.     Mouth/Throat:     Pharynx: No oropharyngeal exudate.  Eyes:     General: Lids are normal.     Conjunctiva/sclera: Conjunctivae normal.  Cardiovascular:     Rate and Rhythm: Normal rate and regular rhythm.     Heart sounds: Normal heart sounds, S1 normal and S2 normal.  Pulmonary:     Breath sounds: Normal breath sounds. No decreased breath sounds, wheezing, rhonchi or rales.  Abdominal:     Palpations: Abdomen is soft.     Tenderness: There is no abdominal tenderness.  Musculoskeletal:     Right lower leg: No swelling.     Left lower leg: No swelling.  Skin:    General: Skin is warm.     Findings: No rash.  Neurological:     Mental Status: She is alert.     Comments: Able to straight leg raise     Data Reviewed: Hemoglobin 9.1, white blood cell count 10.2, creatinine 1.71, glucose 64, sodium 135  Family Communication: Spoke with granddaughter at the bedside  Disposition: Status is: Inpatient Remains inpatient appropriate because: Patient needs to increase appetite even though things do not taste well.  Still giving IV fluids for acute kidney injury and sodium bicarb for metabolic acidosis, on D5 for hypoglycemia.  Today she is orthostatic and midodrine started.  Planned Discharge Destination: Rehab    Time spent: 28 minutes  Author: Alford Highland, MD 09/04/2022 2:52 PM  For on call review [email or web address].

## 2022-09-04 NOTE — Progress Notes (Signed)
Physical Therapy Treatment Patient Details Name: Sydney Gillespie MRN: [id number] DOB: 29-Dec-1940 Today's Date: 09/04/2022   History of Present Illness 82 year old female past medical history of recent MSSA bacteremia on cefazolin, multiple myeloma, chronic kidney disease stage IIIb, compression fracture status post kyphoplasty, hypertension, hyperlipidemia, rheumatoid arthritis presented with near syncope.    PT Comments    Patient received sitting up in bed. She is alert, agrees to PT. She is mod I with bed mobility. Transfers with min A. She is able to ambulate 20 feet with RW and min A. Patient fatigues quickly and then becomes less safe with mobility. Poorly controlled stand to sit on bed.  Patient will continue to benefit from skilled PT to improve activity tolerance, safety and independence.      Recommendations for follow up therapy are one component of a multi-disciplinary discharge planning process, led by the attending physician.  Recommendations may be updated based on patient status, additional functional criteria and insurance authorization.  Follow Up Recommendations  Can patient physically be transported by private vehicle: Yes    Assistance Recommended at Discharge Frequent or constant Supervision/Assistance  Patient can return home with the following A little help with walking and/or transfers;A little help with bathing/dressing/bathroom;Assist for transportation;Help with stairs or ramp for entrance;Assistance with cooking/housework;Direct supervision/assist for medications management   Equipment Recommendations  None recommended by PT    Recommendations for Other Services       Precautions / Restrictions Precautions Precautions: Fall Restrictions Weight Bearing Restrictions: No     Mobility  Bed Mobility Overal bed mobility: Modified Independent Bed Mobility: Supine to Sit     Supine to sit: Modified independent (Device/Increase time)     General bed  mobility comments: increased time and effort needed    Transfers Overall transfer level: Needs assistance Equipment used: None, 1 person hand held assist Transfers: Sit to/from Stand, Bed to chair/wheelchair/BSC Sit to Stand: Min assist Stand pivot transfers: Min assist         General transfer comment: cues for safe hand placement.    Ambulation/Gait Ambulation/Gait assistance: Min assist Gait Distance (Feet): 20 Feet Assistive device: Rolling walker (2 wheels) Gait Pattern/deviations: Step-through pattern, Decreased step length - right, Decreased step length - left Gait velocity: almost too fast at times.     General Gait Details: patient ambulated to door and back. She can get fatigued quickly and then becomes less safe with mobility. Poorly controlled descent to sitting on bed.   Stairs             Wheelchair Mobility    Modified Rankin (Stroke Patients Only)       Balance Overall balance assessment: Needs assistance Sitting-balance support: Feet supported Sitting balance-Leahy Scale: Good     Standing balance support: Bilateral upper extremity supported, During functional activity, Reliant on assistive device for balance Standing balance-Leahy Scale: Fair Standing balance comment: Requires Rw and external assist for safety                            Cognition Arousal/Alertness: Awake/alert Behavior During Therapy: WFL for tasks assessed/performed Overall Cognitive Status: Within Functional Limits for tasks assessed Area of Impairment: Safety/judgement, Awareness, Problem solving, Memory                     Memory: Decreased short-term memory   Safety/Judgement: Decreased awareness of safety Awareness: Intellectual Problem Solving: Slow processing, Requires verbal cues, Requires tactile cues,  Decreased initiation          Exercises      General Comments        Pertinent Vitals/Pain Pain Assessment Pain Assessment:  Faces Faces Pain Scale: Hurts a little bit Pain Location: L side Pain Descriptors / Indicators: Discomfort Pain Intervention(s): Monitored during session    Home Living                          Prior Function            PT Goals (current goals can now be found in the care plan section) Acute Rehab PT Goals Patient Stated Goal: to get stronger PT Goal Formulation: With patient Time For Goal Achievement: 09/08/22 Progress towards PT goals: Progressing toward goals    Frequency    Min 3X/week      PT Plan Current plan remains appropriate    Co-evaluation              AM-PAC PT "6 Clicks" Mobility   Outcome Measure  Help needed turning from your back to your side while in a flat bed without using bedrails?: A Little Help needed moving from lying on your back to sitting on the side of a flat bed without using bedrails?: A Little Help needed moving to and from a bed to a chair (including a wheelchair)?: A Little Help needed standing up from a chair using your arms (e.g., wheelchair or bedside chair)?: A Little Help needed to walk in hospital room?: A Lot Help needed climbing 3-5 steps with a railing? : Total 6 Click Score: 15    End of Session Equipment Utilized During Treatment: Gait belt Activity Tolerance: Patient limited by fatigue Patient left: Other (comment) (seated on side of bed with family and OT present) Nurse Communication: Mobility status PT Visit Diagnosis: Muscle weakness (generalized) (M62.81);Other abnormalities of gait and mobility (R26.89);Difficulty in walking, not elsewhere classified (R26.2);Unsteadiness on feet (R26.81);Repeated falls (R29.6)     Time: 1610-9604 PT Time Calculation (min) (ACUTE ONLY): 20 min  Charges:  $Therapeutic Activity: 8-22 mins                     Nettie Cromwell, PT, GCS 09/04/22,2:14 PM

## 2022-09-04 NOTE — Progress Notes (Signed)
   09/04/22 1600  Spiritual Encounters  Type of Visit Initial  Care provided to: Patient  Referral source Nurse (RN/NT/LPN)  Reason for visit Advance directives  OnCall Visit Yes   Chaplain responded to nurse consult. Patient would like to change HCPOA. AD paperwork is complete but Chaplain could not locate a notary and witnesses for signature. Chaplain will pass to on call Chaplain. Probably will not be able to complete until Monday morning when notary is available. Please page Chaplain services when ready to sign.

## 2022-09-04 NOTE — Assessment & Plan Note (Addendum)
Continue midodrine.  Continue to check orthostatic vital signs at rehab.

## 2022-09-04 NOTE — Progress Notes (Signed)
Pt's blood sugar was 58, pt is asymptomatic, oral intake given. Rechecked at 63. IV Dextrose 505 12.5g administered. Post blood sugar 128.

## 2022-09-04 NOTE — Progress Notes (Signed)
Initial Nutrition Assessment  DOCUMENTATION CODES:   Not applicable  INTERVENTION:   Ensure Enlive po TID, each supplement provides 350 kcal and 20 grams of protein.  Magic cup TID with meals, each supplement provides 290 kcal and 9 grams of protein  MVI po daily   Snacks (8pm)  Pt at high refeed risk; recommend monitor potassium, magnesium and phosphorus labs daily until stable  NUTRITION DIAGNOSIS:   Inadequate oral intake related to acute illness as evidenced by per patient/family report.  GOAL:   Patient will meet greater than or equal to 90% of their needs  MONITOR:   PO intake, Supplement acceptance, Labs, Weight trends, I & O's, Skin  REASON FOR ASSESSMENT:   Consult Assessment of nutrition requirement/status  ASSESSMENT:   82 year old female with a past medical history of recent MSSA bacteremia on cefazolin, multiple myeloma, DVT, barretts esophagus with stenosis, gout, IDA, anxiety, depression, GERD, chronic kidney disease stage IIIb, compression fracture status post kyphoplasty, hypertension, hyperlipidemia, hypothyroidism and rheumatoid arthritis who is admitted with bacteremia, SIRS, AKI and AMS.  Met with pt in room today. Pt reports that her appetite and oral intake have been decreasing for years but reports poor oral intake pta and in hospital. Pt reports that she did eat some breakfast and lunch today; pt is documented to be eating <50% of meals. Pt reports that she does not drink supplements at home but reports that she drinks "gallons" of chocolate milk. RD discussed with pt the importance of adequate nutrition needed to preserve lean muscle. Pt is willing to drink chocolate Ensure and eat Magic Cups in hospital. RD will add supplements and MVI to help pt meet her estimated needs. Pt is at high refeed risk. Per chart, pt appears to be down ~15lbs(11%) over the past month; this is significant. Pt is up ~20lbs since admission.   Medications reviewed and  include: synthroid, iron polysaccharides, protonix, miralax, Na bicarbonate, cefazolin, NaCl w/ 5% dextrose @50ml /hr   Labs reviewed: K 4.1 wnl, BUN 42(H), creat 1.71(H) Hgb 9.1(L), Hct 28.2(L) Cbgs- 78, 123, 63, 62, 58 x 24 hrs   NUTRITION - FOCUSED PHYSICAL EXAM:  Flowsheet Row Most Recent Value  Orbital Region No depletion  Upper Arm Region Mild depletion  Thoracic and Lumbar Region No depletion  Buccal Region No depletion  Temple Region No depletion  Clavicle Bone Region No depletion  Clavicle and Acromion Bone Region No depletion  Scapular Bone Region No depletion  Dorsal Hand Mild depletion  Patellar Region Mild depletion  Anterior Thigh Region Mild depletion  Posterior Calf Region Mild depletion  Edema (RD Assessment) None  Hair Reviewed  Eyes Reviewed  Mouth Reviewed  Skin Reviewed  Nails Reviewed   Diet Order:   Diet Order             Diet regular Fluid consistency: Thin  Diet effective now                  EDUCATION NEEDS:   Education needs have been addressed  Skin:  Skin Assessment: Reviewed RN Assessment (ecchymosis)  Last BM:  4/23  Height:   Ht Readings from Last 1 Encounters:  08/17/22 5\' 5"  (1.651 m)    Weight:   Wt Readings from Last 1 Encounters:  09/04/22 65.5 kg    Ideal Body Weight:  56.8 kg  BMI:  Body mass index is 24.03 kg/m.  Estimated Nutritional Needs:   Kcal:  1500-1700kcal/day  Protein:  75-85g/day  Fluid:  1.5-1.7L/day  Gracemarie Skeet MS, RD, LDN Please refer to [facility] for RD and/or RD on-call/weekend/after hours pager  

## 2022-09-04 NOTE — Care Management Important Message (Signed)
Important Message  Patient Details  Name: Sydney Gillespie MRN: [id number] Date of Birth: [birth year]   Medicare Important Message Given:  Yes     Johnell Comings 09/04/2022, 11:57 AM

## 2022-09-04 NOTE — Progress Notes (Signed)
Occupational Therapy Treatment Patient Details Name: Sydney Gillespie MRN: [id number] DOB: 1940-06-15 Today's Date: 09/04/2022   History of present illness 82 year old female past medical history of recent MSSA bacteremia on cefazolin, multiple myeloma, chronic kidney disease stage IIIb, compression fracture status post kyphoplasty, hypertension, hyperlipidemia, rheumatoid arthritis presented with near syncope.   OT comments  Pt received seated EOB with PT in room at the end of their session. Appearing alert; willing to work with OT on grooming at sink. T/f MIN A with RW and walked to sink where OT had positioned BSC by the sink for pt to have seated rest break; pt noted to have urinary incontinence on bed while seated, so bedsheets changed and underwear changed during session. See flowsheet below for further details of session. Left supine in bed, bed alarm on, with all needs in reach.  Patient will benefit from continued OT while in acute care.    Recommendations for follow up therapy are one component of a multi-disciplinary discharge planning process, led by the attending physician.  Recommendations may be updated based on patient status, additional functional criteria and insurance authorization.    Assistance Recommended at Discharge Frequent or constant Supervision/Assistance  Patient can return home with the following  Assistance with cooking/housework;A lot of help with bathing/dressing/bathroom;Assist for transportation;Help with stairs or ramp for entrance;A lot of help with walking and/or transfers   Equipment Recommendations  None recommended by OT    Recommendations for Other Services      Precautions / Restrictions Precautions Precautions: Fall Restrictions Weight Bearing Restrictions: No       Mobility Bed Mobility Overal bed mobility: Needs Assistance Bed Mobility: Sit to Supine       Sit to supine: Min guard (and cues for how to position body in the bed)    General bed mobility comments: increased time and effort needed    Transfers Overall transfer level: Needs assistance Equipment used: Rolling walker (2 wheels) Transfers: Sit to/from Stand Sit to Stand: Min assist           General transfer comment: cues for hand placement     Balance Overall balance assessment: Needs assistance Sitting-balance support: Feet supported Sitting balance-Leahy Scale: Good     Standing balance support: Bilateral upper extremity supported, Reliant on assistive device for balance Standing balance-Leahy Scale: Fair Standing balance comment: Requires Rw and external assist for safety                           ADL either performed or assessed with clinical judgement   ADL Overall ADL's : Needs assistance/impaired     Grooming: Set up;Sitting Grooming Details (indicate cue type and reason): sitting on BSC in front of mirror. Pt declined to brush teeth/dentures stating she does that at night time.         Upper Body Dressing : Minimal assistance Upper Body Dressing Details (indicate cue type and reason): OT assisted (IV in place) for changing gown today Lower Body Dressing: Moderate assistance Lower Body Dressing Details (indicate cue type and reason): OT assisted with threading underwear (pt almost able to do it in figure four, but mesh underwear stuck on socks). Pt impulsively letting go of RW to pull up underwear with OT providing MIN A for standing balance. Toilet Transfer: Min guard;Minimal assistance;BSC/3in1;Rolling walker (2 wheels) Toilet Transfer Details (indicate cue type and reason): pt abandoning RW prior to transfer, turning a full 270 degrees in front of toilet,  MIN A for balance/safety, then sitting on BSC. Pt had urinary incontinence at EOB at beginning of session despite having just been to toilet with PT in prior session a few minutes ago.   Toileting - Clothing Manipulation Details (indicate cue type and reason): MIN A  in standing for clothing manipulation. Appears that pt did not have further urination in BSC.            Extremity/Trunk Assessment Upper Extremity Assessment Upper Extremity Assessment: Generalized weakness (poor activity tolerance)   Lower Extremity Assessment Lower Extremity Assessment: Defer to PT evaluation        Vision       Perception     Praxis      Cognition Arousal/Alertness: Awake/alert Behavior During Therapy: Impulsive Overall Cognitive Status:  (did not review baseline cognition with family at bedside, but pt with decreased memory, decreased safety/judgement, increased impulsivity today.)                                          Exercises      Shoulder Instructions       General Comments Granddaughter in room during session and helpful with changing bedsheets, as pt is impulsive and needing close supervision at all times due to impulsively leaning forward to adjust clothing or trying to stand before OT was ready.    Pertinent Vitals/ Pain       Pain Assessment Pain Assessment: No/denies pain (no pain in seated or with movement; back pain with supine position)  Home Living                                          Prior Functioning/Environment              Frequency  Min 2X/week        Progress Toward Goals  OT Goals(current goals can now be found in the care plan section)  Progress towards OT goals: Progressing toward goals  Acute Rehab OT Goals Patient Stated Goal: Get better OT Goal Formulation: With patient/family Time For Goal Achievement: 09/16/22 Potential to Achieve Goals: Good ADL Goals Pt Will Perform Grooming: sitting;with modified independence Pt Will Perform Lower Body Dressing: sit to/from stand;with supervision;with set-up Pt Will Transfer to Toilet: bedside commode;with set-up;with supervision;stand pivot transfer Pt Will Perform Toileting - Clothing Manipulation and hygiene: sit  to/from stand;with supervision;with set-up  Plan Discharge plan remains appropriate    Co-evaluation                 AM-PAC OT "6 Clicks" Daily Activity     Outcome Measure   Help from another person eating meals?: None Help from another person taking care of personal grooming?: A Little Help from another person toileting, which includes using toliet, bedpan, or urinal?: A Lot Help from another person bathing (including washing, rinsing, drying)?: A Lot Help from another person to put on and taking off regular upper body clothing?: A Little Help from another person to put on and taking off regular lower body clothing?: A Lot 6 Click Score: 16    End of Session Equipment Utilized During Treatment: Rolling walker (2 wheels)  OT Visit Diagnosis: Other abnormalities of gait and mobility (R26.89)   Activity Tolerance Patient tolerated treatment well (fatigued at end of session)  Patient Left in bed;with call bell/phone within reach;with bed alarm set;with family/visitor present   Nurse Communication Mobility status        Time: 1349-1413 OT Time Calculation (min): 24 min  Charges: OT General Charges $OT Visit: 1 Visit OT Treatments $Self Care/Home Management : 23-37 mins  Linward Foster, MS, OTR/L   Alvester Morin 09/04/2022, 2:30 PM

## 2022-09-04 NOTE — TOC Progression Note (Signed)
Transition of Care [facility]) - Progression Note    Patient Details  Name: Sydney Gillespie MRN: [id number] Date of Birth: 07-26-1940  Transition of Care [facility]) CM/SW Contact  Margarito Liner, LCSW Phone Number: 09/04/2022, 2:21 PM  Clinical Narrative:   Reviewed bed offers with granddaughter at bedside. She is leaning towards [facility] but would like to tour first. Palliative NP mentioned [facility]. Granddaughter confirmed interest. Filled out referral form and faxed to [facility].   Expected Discharge Plan: Skilled Nursing Facility Barriers to Discharge: Continued Medical Work up  Expected Discharge Plan and Services       Living arrangements for the past 2 months: Single Family Home                                       Social Determinants of Health (SDOH) Interventions SDOH Screenings   Food Insecurity: No Food Insecurity (08/05/2022)  Housing: Low Risk  (08/05/2022)  Transportation Needs: No Transportation Needs (08/05/2022)  Utilities: Not At Risk (08/05/2022)  Tobacco Use: Low Risk  (08/17/2022)    Readmission Risk Interventions    09/03/2022    3:44 PM 08/06/2022    3:47 PM 05/10/2022    2:56 PM  Readmission Risk Prevention Plan  Transportation Screening Complete Complete Complete  Medication Review Oceanographer) Complete Complete Complete  PCP or Specialist appointment within 3-5 days of discharge  Complete Complete  HRI or Home Care Consult Complete  Complete  SW Recovery Care/Counseling Consult Complete Complete Complete  Palliative Care Screening Complete Not Applicable Not Applicable  Skilled Nursing Facility Complete Not Applicable Not Applicable

## 2022-09-04 NOTE — Assessment & Plan Note (Signed)
Likely secondary to acute kidney injury.  Potentially worsened by treatment with saline.  Switch to sodium bicarb drip today.  Will check VBG tomorrow morning.

## 2022-09-05 DIAGNOSIS — R7881 Bacteremia: Secondary | ICD-10-CM | POA: Diagnosis not present

## 2022-09-05 DIAGNOSIS — N179 Acute kidney failure, unspecified: Secondary | ICD-10-CM | POA: Diagnosis not present

## 2022-09-05 DIAGNOSIS — E872 Acidosis, unspecified: Secondary | ICD-10-CM | POA: Diagnosis not present

## 2022-09-05 DIAGNOSIS — Z515 Encounter for palliative care: Secondary | ICD-10-CM

## 2022-09-05 DIAGNOSIS — R531 Weakness: Secondary | ICD-10-CM | POA: Diagnosis not present

## 2022-09-05 DIAGNOSIS — I951 Orthostatic hypotension: Secondary | ICD-10-CM | POA: Diagnosis not present

## 2022-09-05 DIAGNOSIS — Z7189 Other specified counseling: Secondary | ICD-10-CM | POA: Diagnosis not present

## 2022-09-05 DIAGNOSIS — R55 Syncope and collapse: Secondary | ICD-10-CM | POA: Diagnosis not present

## 2022-09-05 LAB — CBC
HCT: 28.6 % — ABNORMAL LOW (ref 36.0–46.0)
Hemoglobin: 9.3 g/dL — ABNORMAL LOW (ref 12.0–15.0)
MCH: 30.2 pg (ref 26.0–34.0)
MCHC: 32.5 g/dL (ref 30.0–36.0)
MCV: 92.9 fL (ref 80.0–100.0)
Platelets: 210 10*3/uL (ref 150–400)
RBC: 3.08 MIL/uL — ABNORMAL LOW (ref 3.87–5.11)
RDW: 18.3 % — ABNORMAL HIGH (ref 11.5–15.5)
WBC: 10.3 10*3/uL (ref 4.0–10.5)
nRBC: 0 % (ref 0.0–0.2)

## 2022-09-05 LAB — BASIC METABOLIC PANEL
Anion gap: 14 (ref 5–15)
BUN: 37 mg/dL — ABNORMAL HIGH (ref 8–23)
CO2: 13 mmol/L — ABNORMAL LOW (ref 22–32)
Calcium: 7.9 mg/dL — ABNORMAL LOW (ref 8.9–10.3)
Chloride: 107 mmol/L (ref 98–111)
Creatinine, Ser: 1.47 mg/dL — ABNORMAL HIGH (ref 0.44–1.00)
GFR, Estimated: 35 mL/min — ABNORMAL LOW (ref >60)
Glucose, Bld: 95 mg/dL (ref 70–99)
Potassium: 3.9 mmol/L (ref 3.5–5.1)
Sodium: 134 mmol/L — ABNORMAL LOW (ref 135–145)

## 2022-09-05 LAB — GLUCOSE, CAPILLARY
Glucose-Capillary: 78 mg/dL (ref 70–99)
Glucose-Capillary: 78 mg/dL (ref 70–99)

## 2022-09-05 LAB — CULTURE, BLOOD (ROUTINE X 2): Culture: NO GROWTH

## 2022-09-05 MED ORDER — SODIUM BICARBONATE 8.4 % IV SOLN
INTRAVENOUS | Status: DC
  Filled 2022-09-05 (×2): qty 1000
  Filled 2022-09-05 (×2): qty 150
  Filled 2022-09-05: qty 1000

## 2022-09-05 NOTE — Progress Notes (Signed)
Daily Progress Note   Patient Name: Sydney Gillespie       Date: 09/05/2022 DOB: 1940/10/14  Age: 82 y.o. MRN#: [id number] Attending Physician: Alford Highland, MD Primary Care Physician: Barbette Reichmann, MD Admit Date: 09/01/2022  Reason for Consultation/Follow-up: Establishing goals of care  HPI/Brief Hospital Review: Sydney Gillespie is an 82 year old female with a past medical history of recent MSSA bacteremia, osteomyelitis/discitis, multiple myeloma, CKD 3b, compression fractures s/p kyphoplasty, HTN, HLD and rheumatoid arthritis present from home with near syncopal episode.  Reports that she had been up walking around morning of admission and went to grab something from cabinet overhead and started seeing spots. Reports losing consciousness but denies head trauma. She was found by a home health aid who called for EMS. Apparently Sydney Gillespie has been experiencing intermittent dizziness for the last several weeks with frequent falls which is complicated by chronic lower extremity weakness.  Admitted for AKI superimposed on CKD, metabolic acidosis and orthostatic hypotension with frequent falls and SIRS  AMS and lethargy have been of concern this admission with ongoing improvements each day MRI (-)  Palliative Medicine consulted for assisting with goals of care conversations.  Subjective: Extensive chart review has been completed prior to meeting patient including labs, vital signs, imaging, progress notes, orders, and available advanced directive documents from current and previous encounters.    Visited with Sydney Gillespie at her bedside. Struggled with eating her breakfast this morning but at a later visit, granddaughter-Sydney Gillespie brought lunch from outside hospital and Sydney Gillespie was enjoying  her soup and chicken. Denies acute pain or discomfort. Reports a restful night.  Sydney Gillespie-granddaughter at bedside. [name] spoke and reviewed had 4/26 with Crystal, NP from PMT team. [name] shares the conversation was helpful and she was able to reflect on issues and concerns addressed overnight. She and Ms. Char had a chance to discuss GOC and AD documentation. They have proceeded with completing AD documents and are awaiting notary services from chaplain-likely Monday.  Clarification surrounding difference between Code Status and Living Will documentation. Education provided to Sydney Gillespie and [name] on Code Status-Full Code versus DNR. Both Sydney Gillespie and [name] expressed their understanding and desire for DNR. Form completed, copied to be scanned into record and original copy placed into chart.  Discussed and reviewed MOST form as discharge  disposition likely to be SNF.  MOST form as outlined below: -DNR -Limited Additional Interventions -Antibiotics if indicated -IV fluids if indicated -No feeding tube  Form completed, copied to be scanned into medical record, original copy placed in chart  [name] shares her goal is to recover and regain her strength at rehab and improve her diet as she can tolerate. She wishes to make a trip to [place] to be able to visit with her grandchildren and celebrate her sons birthday.  Addressed all questions and concerns. PMT to remain available for ongoing needs and support.  Objective:  Physical Exam Constitutional:      General: She is not in acute distress. Cardiovascular:     Pulses: Normal pulses.     Heart sounds: No murmur heard. Pulmonary:     Effort: Pulmonary effort is normal.  Abdominal:     General: There is distension.     Palpations: Abdomen is soft.  Skin:    General: Skin is warm and dry.     Findings: Bruising present.  Neurological:     Mental Status: She is alert.     Motor: Weakness present.             Vital  Signs: BP 122/79 (BP Location: Left Arm)   Pulse 68   Temp 98.1 F (36.7 C) (Oral)   Resp 16   Wt 60.8 kg   SpO2 100%   BMI 22.31 kg/m  SpO2: SpO2: 100 % O2 Device: O2 Device: Room Air O2 Flow Rate:     Palliative Care Assessment & Plan   Assessment/Recommendation/Plan  DNR/DNI MOST form completed Continue current plan of care Anticipate d/c to SNF PMT available for ongoing needs and support  Care plan was discussed with primary team.  Thank you for allowing the Palliative Medicine Team to assist in the care of this patient.  Total time:  50 minutes  Time spent includes: Detailed review of medical records (labs, imaging, vital signs), medically appropriate exam (mental status, respiratory, cardiac, skin), discussed with treatment team, counseling and educating patient, family and staff, documenting clinical information, medication management and coordination of care.  [name], DNP, AGNP-C Palliative Medicine   Please contact Palliative Medicine Team phone at [phone] for questions and concerns.

## 2022-09-05 NOTE — Progress Notes (Signed)
Progress Note   Patient: Sydney Gillespie ZOX:[id number] DOB: 1940-06-11 DOA: 09/01/2022     4 DOS: the patient was seen and examined on 09/05/2022   Brief hospital course: 82 year old female past medical history of recent MSSA bacteremia on cefazolin, multiple myeloma, chronic kidney disease stage IIIb, compression fracture status post kyphoplasty, hypertension, hyperlipidemia, rheumatoid arthritis presented with near syncope.  Mrs. Sydney Gillespie states that she had been up and walking around this morning when she reached up to grab something from a cabinet and started seeing spots. She subsequently lost consciousness. Mrs. Sydney Gillespie denies knowingly hitting her head. A home health aide found her on the ground and EMS was called. Patient's granddaughter at bedside states that over the last several weeks, patient has had intermittent dizziness that has been difficult for them to discern from her usual vertigo. She denies any prior loss of consciousness over the last few weeks. Mrs. Sydney Gillespie also endorses frequent falls but states that it is usually due to bilateral lower extremity weakness that is chronic and unchanged. She denies any known fevers, chills, abdominal pain, diarrhea, shortness of breath, cough, chest pain or palpitations. She notes one-time episode of nausea with vomiting yesterday with no further episodes of nausea afterwards.   4/24.  Patient lethargic this morning.  MRI of the brain negative for acute intracranial abnormality 4/25.  Patient's mental status much improved.  With hemoglobin dropping down to 7.2 we will give a unit of packed red blood cells.  Continue hydration for acute kidney injury.  Sodium bicarb tablets added. 4/26.  Hemoglobin responded to blood transfusion with hemoglobin up at 9.1.  Sodium bicarb still low at 16 and will increase sodium bicarb tablets to 3 times daily dosing.  Patient orthostatic today so midodrine added.  Patient again had low sugar this morning. 4/27.  CO2  down to 13.  Change IV fluids over to sodium bicarb drip.  Discontinue bicarb tablets.  Metabolic acidosis could be secondary to acute kidney injury and then treatment with saline.  Assessment and Plan: * Acute kidney injury superimposed on CKD (HCC) Acute kidney injury on CKD stage IIIb.  Will continue gentle IV fluid hydration.  Creatinine 2.23 on presentation.  Creatinine down to 1.47.  Baseline creatinine about 1.3.  Metabolic acidosis Likely secondary to acute kidney injury.  Potentially worsened by treatment with saline.  Switch to sodium bicarb drip today.  Will check VBG tomorrow morning.  Orthostatic hypotension Continue midodrine.  Continue IV fluids.  Continue to check orthostatic vital signs.  Hypoglycemia As per granddaughter patient eating better  MSSA bacteremia Continue cefazolin as per ID.  SIRS (systemic inflammatory response syndrome) (HCC) Presenting with hypothermia, hypotension, and elevated WBC.  Continue cefazolin as per ID.  So far blood cultures negative for 4 days.  Lethargy Much improved.  Continue to hold Ditropan and Myrbetriq.  Decrease dose of Requip.  Syncope Secondary to orthostatic hypotension.  Hyponatremia Last sodium 134  Anemia of chronic disease With ferritin up at 377.  Responded to blood transfusion for hemoglobin of 7.2.  Last hemoglobin 9.3.  History of DVT (deep vein thrombosis) During admission for COVID-19 in December 2023, patient was found to have a nonocclusive thrombus in the posterior tibial vein of the right calf.  Doppler in March 2024 demonstrated persistent DVT presence.  - Continue home Eliquis  Rheumatoid arthritis (HCC) - Continue home Plaquenil  Hypothyroidism TSH elevated at 36.7.  Increased dose of levothyroxine to 137 mcg.  Multiple myeloma (HCC) Previously treated with  daratumumab with plans to start Revlimid, but had to be placed on hold due to multiple admissions since December 2023.  - Continue  outpatient follow-up with oncology  Palpitations Continue to monitor.        Subjective: Patient eating a little bit better as per granddaughter.  Patient scared to get up and walk because she feels dizzy if she stands.  Physical Exam: Vitals:   09/04/22 1938 09/05/22 0428 09/05/22 0439 09/05/22 0741  BP: 131/62 123/64  122/79  Pulse: 69 70  68  Resp: 16 16  16   Temp: 97.6 F (36.4 C) 98.4 F (36.9 C)  98.1 F (36.7 C)  TempSrc: Oral   Oral  SpO2: 100% 100%  100%  Weight:   60.8 kg    Physical Exam HENT:     Head: Normocephalic.     Mouth/Throat:     Pharynx: No oropharyngeal exudate.  Eyes:     General: Lids are normal.     Conjunctiva/sclera: Conjunctivae normal.  Cardiovascular:     Rate and Rhythm: Normal rate and regular rhythm.     Heart sounds: Normal heart sounds, S1 normal and S2 normal.  Pulmonary:     Breath sounds: Normal breath sounds. No decreased breath sounds, wheezing, rhonchi or rales.  Abdominal:     Palpations: Abdomen is soft.     Tenderness: There is no abdominal tenderness.  Musculoskeletal:     Right lower leg: No swelling.     Left lower leg: No swelling.  Skin:    General: Skin is warm.     Findings: No rash.  Neurological:     Mental Status: She is alert.     Comments: Able to straight leg raise     Data Reviewed: CO2 down to 13.  Creatinine down to 0.47, hemoglobin 9.3  Family Communication: Updated granddaughter on phone  Disposition: Status is: Inpatient Remains inpatient appropriate because: Would like to improve acidosis prior to discharge out to rehab.  Planned Discharge Destination: Rehab    Time spent: 27 minutes  Author: Alford Highland, MD 09/05/2022 12:30 PM  For on call review [email or web address].

## 2022-09-05 NOTE — Progress Notes (Signed)
Mobility Specialist - Progress Note     09/05/22 1609  Mobility  Activity Ambulated with assistance in hallway;Dangled on edge of bed  Level of Assistance Minimal assist, patient does 75% or more  Assistive Device Front wheel walker  Distance Ambulated (ft) 40 ft  Range of Motion/Exercises Active  Activity Response Tolerated well  Mobility Referral Yes  $Mobility charge 1 Mobility   Pt resting in bed on RA upon entry. Pt STS and ambulates to hallway around NS for half a lap. Pt returned to bed and left with needs in reach and bed alarm activated.   Johnathan Hausen Mobility Specialist 09/05/22, 4:38 PM

## 2022-09-05 NOTE — Progress Notes (Signed)
Mobility Specialist - Progress Note      09/05/22 1638  Mobility  Activity Ambulated with assistance in room;Transferred to/from [facility]  Level of Assistance Minimal assist, patient does 75% or more  Assistive Device Front wheel walker  Distance Ambulated (ft) 4 ft  Range of Motion/Exercises Active  Activity Response Tolerated well  Mobility Referral Yes  $Mobility charge 1 Mobility   Pt resting in bed on RA upon entry. Pt STS and ambulates to [facility] and the foot of bed with RW. Pt returned to bed and left with needs in reach and bed alarm activated. Pt granddaughter present at bedside.   Johnathan Hausen Mobility Specialist 09/05/22, 4:41 PM

## 2022-09-06 DIAGNOSIS — I951 Orthostatic hypotension: Secondary | ICD-10-CM | POA: Diagnosis not present

## 2022-09-06 DIAGNOSIS — N179 Acute kidney failure, unspecified: Secondary | ICD-10-CM | POA: Diagnosis not present

## 2022-09-06 DIAGNOSIS — R7881 Bacteremia: Secondary | ICD-10-CM | POA: Diagnosis not present

## 2022-09-06 DIAGNOSIS — E872 Acidosis, unspecified: Secondary | ICD-10-CM | POA: Diagnosis not present

## 2022-09-06 LAB — GLUCOSE, CAPILLARY: Glucose-Capillary: 81 mg/dL (ref 70–99)

## 2022-09-06 LAB — BASIC METABOLIC PANEL
Anion gap: 13 (ref 5–15)
BUN: 31 mg/dL — ABNORMAL HIGH (ref 8–23)
CO2: 17 mmol/L — ABNORMAL LOW (ref 22–32)
Calcium: 7.8 mg/dL — ABNORMAL LOW (ref 8.9–10.3)
Chloride: 104 mmol/L (ref 98–111)
Creatinine, Ser: 1.33 mg/dL — ABNORMAL HIGH (ref 0.44–1.00)
GFR, Estimated: 40 mL/min — ABNORMAL LOW (ref >60)
Glucose, Bld: 82 mg/dL (ref 70–99)
Potassium: 3.9 mmol/L (ref 3.5–5.1)
Sodium: 134 mmol/L — ABNORMAL LOW (ref 135–145)

## 2022-09-06 LAB — BLOOD GAS, VENOUS
Acid-base deficit: 5.4 mmol/L — ABNORMAL HIGH (ref 0.0–2.0)
Bicarbonate: 20 mmol/L (ref 20.0–28.0)
O2 Saturation: 64.3 %
Patient temperature: 37
pCO2, Ven: 38 mmHg — ABNORMAL LOW (ref 44–60)
pH, Ven: 7.33 (ref 7.25–7.43)
pO2, Ven: 35 mmHg (ref 32–45)

## 2022-09-06 LAB — CULTURE, BLOOD (ROUTINE X 2): Culture: NO GROWTH

## 2022-09-06 LAB — HEMOGLOBIN: Hemoglobin: 9.1 g/dL — ABNORMAL LOW (ref 12.0–15.0)

## 2022-09-06 NOTE — Plan of Care (Signed)
Pt A&Ox4 Afebrile on RA with no respiratory distress noted. No pain issues this shift. Pt continues on ABX with no adverse reactions noted. Sodium Bicarb continues with no complications noted.  IV clean, dry and intact. All safety precautions maintained, bed in low position, call bell within reach, bed in low position. Pt resting in bed at this time. Will continue with plan of care. Will cont with POC  Problem: Education: Goal: Knowledge of condition and prescribed therapy will improve 09/06/2022 1146 by Oneita Kras, RN Outcome: Progressing 09/06/2022 1146 by Oneita Kras, RN Outcome: Progressing   Problem: Cardiac: Goal: Will achieve and/or maintain adequate cardiac output 09/06/2022 1146 by Oneita Kras, RN Outcome: Progressing 09/06/2022 1146 by Oneita Kras, RN Outcome: Progressing   Problem: Physical Regulation: Goal: Complications related to the disease process, condition or treatment will be avoided or minimized 09/06/2022 1146 by Oneita Kras, RN Outcome: Progressing 09/06/2022 1146 by Oneita Kras, RN Outcome: Progressing   Problem: Education: Goal: Knowledge of General Education information will improve Description: Including pain rating scale, medication(s)/side effects and non-pharmacologic comfort measures 09/06/2022 1146 by Oneita Kras, RN Outcome: Progressing 09/06/2022 1146 by Oneita Kras, RN Outcome: Progressing   Problem: Health Behavior/Discharge Planning: Goal: Ability to manage health-related needs will improve 09/06/2022 1146 by Oneita Kras, RN Outcome: Progressing 09/06/2022 1146 by Oneita Kras, RN Outcome: Progressing   Problem: Clinical Measurements: Goal: Ability to maintain clinical measurements within normal limits will improve 09/06/2022 1146 by Oneita Kras, RN Outcome: Progressing 09/06/2022 1146 by Oneita Kras, RN Outcome: Progressing Goal:  Will remain free from infection 09/06/2022 1146 by Oneita Kras, RN Outcome: Progressing 09/06/2022 1146 by Oneita Kras, RN Outcome: Progressing Goal: Diagnostic test results will improve 09/06/2022 1146 by Oneita Kras, RN Outcome: Progressing 09/06/2022 1146 by Oneita Kras, RN Outcome: Progressing Goal: Respiratory complications will improve 09/06/2022 1146 by Oneita Kras, RN Outcome: Progressing 09/06/2022 1146 by Oneita Kras, RN Outcome: Progressing Goal: Cardiovascular complication will be avoided 09/06/2022 1146 by Oneita Kras, RN Outcome: Progressing 09/06/2022 1146 by Oneita Kras, RN Outcome: Progressing   Problem: Activity: Goal: Risk for activity intolerance will decrease 09/06/2022 1146 by Oneita Kras, RN Outcome: Progressing 09/06/2022 1146 by Oneita Kras, RN Outcome: Progressing   Problem: Nutrition: Goal: Adequate nutrition will be maintained 09/06/2022 1146 by Oneita Kras, RN Outcome: Progressing 09/06/2022 1146 by Oneita Kras, RN Outcome: Progressing   Problem: Coping: Goal: Level of anxiety will decrease 09/06/2022 1146 by Oneita Kras, RN Outcome: Progressing 09/06/2022 1146 by Oneita Kras, RN Outcome: Progressing   Problem: Elimination: Goal: Will not experience complications related to bowel motility 09/06/2022 1146 by Oneita Kras, RN Outcome: Progressing 09/06/2022 1146 by Oneita Kras, RN Outcome: Progressing Goal: Will not experience complications related to urinary retention 09/06/2022 1146 by Oneita Kras, RN Outcome: Progressing 09/06/2022 1146 by Oneita Kras, RN Outcome: Progressing   Problem: Pain Managment: Goal: General experience of comfort will improve 09/06/2022 1146 by Oneita Kras, RN Outcome: Progressing 09/06/2022 1146 by Oneita Kras, RN Outcome:  Progressing   Problem: Safety: Goal: Ability to remain free from injury will improve 09/06/2022 1146 by Oneita Kras, RN Outcome: Progressing 09/06/2022 1146 by Oneita Kras, RN Outcome: Progressing   Problem: Skin Integrity: Goal: Risk for impaired skin integrity will decrease 09/06/2022 1146 by Oneita Kras, RN Outcome: Progressing 09/06/2022 1146  by Oneita Kras, RN Outcome: Progressing

## 2022-09-06 NOTE — TOC Progression Note (Addendum)
Transition of Care [facility]) - Progression Note    Patient Details  Name: Sydney Gillespie MRN: [id number] Date of Birth: Feb 04, 1941  Transition of Care [facility]) CM/SW Contact  Carmina Miller, LCSWA Phone Number: 09/06/2022, 9:43 AM  Clinical Narrative:    Update: 4:08 pm-Granddaughter chooses [facility].  CSW spoke with pt's granddaughter [name], she states she plans on visiting [facility] and [facility] by tomorrow and is hopeful for a decision by Tuesday. [name] states she is very overwhelmed with the decisions that need to be made but is working through everything. TOC will follow up with [name] on Tuesday.   Expected Discharge Plan: Skilled Nursing Facility Barriers to Discharge: Continued Medical Work up  Expected Discharge Plan and Services       Living arrangements for the past 2 months: Single Family Home                                       Social Determinants of Health (SDOH) Interventions SDOH Screenings   Food Insecurity: No Food Insecurity (09/04/2022)  Housing: Low Risk  (09/04/2022)  Transportation Needs: No Transportation Needs (08/05/2022)  Utilities: Not At Risk (08/05/2022)  Tobacco Use: Low Risk  (09/04/2022)    Readmission Risk Interventions    09/03/2022    3:44 PM 08/06/2022    3:47 PM 05/10/2022    2:56 PM  Readmission Risk Prevention Plan  Transportation Screening Complete Complete Complete  Medication Review Oceanographer) Complete Complete Complete  PCP or Specialist appointment within 3-5 days of discharge  Complete Complete  HRI or Home Care Consult Complete  Complete  SW Recovery Care/Counseling Consult Complete Complete Complete  Palliative Care Screening Complete Not Applicable Not Applicable  Skilled Nursing Facility Complete Not Applicable Not Applicable

## 2022-09-06 NOTE — Progress Notes (Signed)
Ms. Gamboa resting comfortably on assessment. No family at bedside. Met with family 4/27 encouraged to contact PMT for any needs as they arise. No acute palliative needs at this time. TOC assisting with discharge disposition.  No Charge.  [name], DNP, AGNP-C Palliative Medicine  Please call Palliative Medicine team phone with any questions [phone]. For individual providers please see AMION.

## 2022-09-06 NOTE — Progress Notes (Signed)
Progress Note   Patient: Sydney Gillespie VZD:[id number] DOB: 1940/12/30 DOA: 09/01/2022     5 DOS: the patient was seen and examined on 09/06/2022   Brief hospital course: 82 year old female past medical history of recent MSSA bacteremia on cefazolin, multiple myeloma, chronic kidney disease stage IIIb, compression fracture status post kyphoplasty, hypertension, hyperlipidemia, rheumatoid arthritis presented with near syncope.  Sydney Gillespie states that she had been up and walking around this morning when she reached up to grab something from a cabinet and started seeing spots. She subsequently lost consciousness. Sydney Gillespie denies knowingly hitting her head. A home health aide found her on the ground and EMS was called. Patient's granddaughter at bedside states that over the last several weeks, patient has had intermittent dizziness that has been difficult for them to discern from her usual vertigo. She denies any prior loss of consciousness over the last few weeks. Sydney Gillespie also endorses frequent falls but states that it is usually due to bilateral lower extremity weakness that is chronic and unchanged. She denies any known fevers, chills, abdominal pain, diarrhea, shortness of breath, cough, chest pain or palpitations. She notes one-time episode of nausea with vomiting yesterday with no further episodes of nausea afterwards.   4/24.  Patient lethargic this morning.  MRI of the brain negative for acute intracranial abnormality 4/25.  Patient's mental status much improved.  With hemoglobin dropping down to 7.2 we will give a unit of packed red blood cells.  Continue hydration for acute kidney injury.  Sodium bicarb tablets added. 4/26.  Hemoglobin responded to blood transfusion with hemoglobin up at 9.1.  Sodium bicarb still low at 16 and will increase sodium bicarb tablets to 3 times daily dosing.  Patient orthostatic today so midodrine added.  Patient again had low sugar this morning. 4/27.  CO2  down to 13.  Change IV fluids over to sodium bicarb drip.  Discontinue bicarb tablets.  Metabolic acidosis could be secondary to acute kidney injury and then treatment with saline. 4/28.  CO2 up to 17 and creatinine down to 1.33.  Continue bicarb drip today.  Assessment and Plan: * Acute kidney injury superimposed on CKD (HCC) Acute kidney injury on CKD stage IIIb.  Will continue gentle IV fluid hydration.  Creatinine 2.23 on presentation.  Creatinine down to 1.33.  Baseline creatinine about 1.3.  Metabolic acidosis Likely secondary to acute kidney injury.  Potentially worsened by treatment with saline.  Continue sodium bicarb drip today.  VBG with slightly lower pH at 7.33.  Left bicarb on the VBG was 20.  Orthostatic hypotension Continue midodrine.  Continue IV fluids.  Continue to check orthostatic vital signs.  Hypoglycemia As per granddaughter patient eating better  MSSA bacteremia Continue cefazolin as per ID.  SIRS (systemic inflammatory response syndrome) (HCC) Presenting with hypothermia, hypotension, and elevated WBC.  Continue cefazolin as per ID.  So far blood cultures negative for 5 days.  Lethargy Much improved.  Continue to hold Ditropan and Myrbetriq.  Decrease dose of Requip.  Syncope Secondary to orthostatic hypotension.  Hyponatremia Last sodium 134  Anemia of chronic disease With ferritin up at 377.  Responded to blood transfusion for hemoglobin of 7.2.  Last hemoglobin 9.1.  History of DVT (deep vein thrombosis) During admission for COVID-19 in December 2023, patient was found to have a nonocclusive thrombus in the posterior tibial vein of the right calf.  Doppler in March 2024 demonstrated persistent DVT presence.  - Continue home Eliquis  Rheumatoid arthritis (HCC) -  Continue home Plaquenil  Hypothyroidism TSH elevated at 36.7.  Increased dose of levothyroxine to 137 mcg.  Multiple myeloma (HCC) Previously treated with daratumumab with plans to  start Revlimid, but had to be placed on hold due to multiple admissions since December 2023.  - Continue outpatient follow-up with oncology  Palpitations Continue to monitor.        Subjective: Patient feeling better.  She states she is eating better.  Admitted with acute kidney injury on CKD and metabolic acidosis and orthostatic hypotension.  Patient's granddaughter concerned about some confusion.  Physical Exam: Vitals:   09/05/22 1918 09/06/22 0417 09/06/22 0447 09/06/22 0805  BP: 129/66  (!) 125/55 126/81  Pulse: 72  69 69  Resp: 16  16 16   Temp: 98.1 F (36.7 C)  97.8 F (36.6 C) (!) 97.4 F (36.3 C)  TempSrc: Oral  Oral   SpO2: 100%  100% 100%  Weight:  60.8 kg     Physical Exam HENT:     Head: Normocephalic.     Mouth/Throat:     Pharynx: No oropharyngeal exudate.  Eyes:     General: Lids are normal.     Conjunctiva/sclera: Conjunctivae normal.  Cardiovascular:     Rate and Rhythm: Normal rate and regular rhythm.     Heart sounds: Normal heart sounds, S1 normal and S2 normal.  Pulmonary:     Breath sounds: Normal breath sounds. No decreased breath sounds, wheezing, rhonchi or rales.  Abdominal:     Palpations: Abdomen is soft.     Tenderness: There is no abdominal tenderness.  Musculoskeletal:     Right lower leg: No swelling.     Left lower leg: No swelling.  Skin:    General: Skin is warm.     Findings: No rash.  Neurological:     Mental Status: She is alert.     Comments: Able to straight leg raise     Data Reviewed: Sodium 134, CO2 17, creatinine 1.33, hemoglobin 9.1  Family Communication: Spoke with granddaughter on the phone  Disposition: Status is: Inpatient Remains inpatient appropriate because: Will need rehab.  Continue bicarb drip today to see if we can get CO2 a little bit higher.  Planned Discharge Destination: Rehab    Time spent: 27 minutes  Author: Alford Highland, MD 09/06/2022 2:36 PM  For on call review  [email or web address].

## 2022-09-07 DIAGNOSIS — R7881 Bacteremia: Secondary | ICD-10-CM | POA: Diagnosis not present

## 2022-09-07 DIAGNOSIS — N179 Acute kidney failure, unspecified: Secondary | ICD-10-CM | POA: Diagnosis not present

## 2022-09-07 DIAGNOSIS — I951 Orthostatic hypotension: Secondary | ICD-10-CM | POA: Diagnosis not present

## 2022-09-07 DIAGNOSIS — E872 Acidosis, unspecified: Secondary | ICD-10-CM | POA: Diagnosis not present

## 2022-09-07 LAB — BASIC METABOLIC PANEL
Anion gap: 12 (ref 5–15)
BUN: 30 mg/dL — ABNORMAL HIGH (ref 8–23)
CO2: 24 mmol/L (ref 22–32)
Calcium: 7.8 mg/dL — ABNORMAL LOW (ref 8.9–10.3)
Chloride: 100 mmol/L (ref 98–111)
Creatinine, Ser: 1.2 mg/dL — ABNORMAL HIGH (ref 0.44–1.00)
GFR, Estimated: 45 mL/min — ABNORMAL LOW (ref >60)
Glucose, Bld: 84 mg/dL (ref 70–99)
Potassium: 3.4 mmol/L — ABNORMAL LOW (ref 3.5–5.1)
Sodium: 136 mmol/L (ref 135–145)

## 2022-09-07 LAB — HEMOGLOBIN: Hemoglobin: 8.5 g/dL — ABNORMAL LOW (ref 12.0–15.0)

## 2022-09-07 LAB — GLUCOSE, CAPILLARY: Glucose-Capillary: 81 mg/dL (ref 70–99)

## 2022-09-07 MED ORDER — ROPINIROLE HCL 0.25 MG PO TABS: 0.2500 mg | ORAL_TABLET | Freq: Every day | ORAL | 0 refills | Status: AC

## 2022-09-07 MED ORDER — ADULT MULTIVITAMIN W/MINERALS CH: 1.0000 | ORAL_TABLET | Freq: Every day | ORAL | 0 refills | Status: AC

## 2022-09-07 MED ORDER — LEVOTHYROXINE SODIUM 137 MCG PO TABS: 137.0000 ug | ORAL_TABLET | Freq: Every day | ORAL | 0 refills | Status: AC

## 2022-09-07 MED ORDER — POLYETHYLENE GLYCOL 3350 17 G PO PACK: 17.0000 g | PACK | Freq: Every day | ORAL | 0 refills | Status: AC

## 2022-09-07 MED ORDER — ALPRAZOLAM 0.25 MG PO TABS: 0.2500 mg | ORAL_TABLET | Freq: Two times a day (BID) | ORAL | 0 refills | Status: DC | PRN

## 2022-09-07 MED ORDER — ACETAMINOPHEN 325 MG PO TABS: 650.0000 mg | ORAL_TABLET | Freq: Four times a day (QID) | ORAL | Status: AC | PRN

## 2022-09-07 MED ORDER — SODIUM CHLORIDE 0.9% FLUSH: 3.0000 mL | Freq: Two times a day (BID) | INTRAVENOUS | 0 refills | Status: DC

## 2022-09-07 MED ORDER — POTASSIUM CHLORIDE CRYS ER 20 MEQ PO TBCR
40.0000 meq | EXTENDED_RELEASE_TABLET | Freq: Once | ORAL | Status: AC
  Administered 2022-09-07: 40 meq via ORAL
  Filled 2022-09-07: qty 2

## 2022-09-07 MED ORDER — MIDODRINE HCL 5 MG PO TABS: 5.0000 mg | ORAL_TABLET | Freq: Three times a day (TID) | ORAL | 0 refills | Status: AC

## 2022-09-07 MED ORDER — ENSURE ENLIVE PO LIQD: 237.0000 mL | Freq: Three times a day (TID) | ORAL | 0 refills | Status: AC

## 2022-09-07 MED ORDER — OLANZAPINE 10 MG PO TABS: 10.0000 mg | ORAL_TABLET | Freq: Every day | ORAL | 0 refills | Status: AC

## 2022-09-07 MED ORDER — CEFAZOLIN IV (FOR PTA / DISCHARGE USE ONLY)
2.0000 g | Freq: Two times a day (BID) | INTRAVENOUS | 0 refills | Status: DC
Start: 2022-09-07 — End: 2022-09-23

## 2022-09-07 NOTE — Care Management Important Message (Signed)
Important Message  Patient Details  Name: Sydney Gillespie MRN: [id number] Date of Birth: [birth year]   Medicare Important Message Given:  Yes     Johnell Comings 09/07/2022, 11:49 AM

## 2022-09-07 NOTE — Progress Notes (Signed)
   09/07/22 1400  Spiritual Encounters  Type of Visit Initial  Care provided to: Pt and family  Conversation partners present during encounter Nurse  Referral source Patient request  Reason for visit Advance directives  OnCall Visit Yes  Spiritual Framework  Presenting Themes Other (comment)  Community/Connection Family  Patient Stress Factors Not reviewed  Family Stress Factors Not reviewed  Interventions  Spiritual Care Interventions Made Established relationship of care and support;Compassionate presence  Spiritual Care Plan  Spiritual Care Issues Still Outstanding Chaplain will continue to follow   Called to complete AD could not find a notary at the time will revisit to close out Advance Directive

## 2022-09-07 NOTE — Discharge Summary (Signed)
Physician Discharge Summary   Sydney Gillespie: Sydney Gillespie MRN: [id number] DOB: [birth year]  Admit date:     09/01/2022  Discharge date: 09/07/22  Discharge Physician: Alford Highland   PCP: Barbette Reichmann, MD   Recommendations at discharge:   Follow-up at Dr. At facility 1 day PICC line care.  Removal of PICC line after completion of antibiotics on 09/17/2022.  Discharge Diagnoses: Principal Problem:   Acute kidney injury superimposed on CKD (HCC) Active Problems:   Metabolic acidosis   Orthostatic hypotension   Hypoglycemia   MSSA bacteremia   SIRS (systemic inflammatory response syndrome) (HCC)   Lethargy   Syncope   Palpitations   Multiple myeloma (HCC)   Hypothyroidism   Rheumatoid arthritis (HCC)   History of DVT (deep vein thrombosis)   Anemia of chronic disease   Hyponatremia    Hospital Course: 82 year old female past medical history of recent MSSA bacteremia on cefazolin, multiple myeloma, chronic kidney disease stage IIIb, compression fracture status post kyphoplasty, hypertension, hyperlipidemia, rheumatoid arthritis presented with near syncope.  Sydney Gillespie states that she had been up and walking around this morning when she reached up to grab something from a cabinet and started seeing spots. She subsequently lost consciousness. Sydney Gillespie denies knowingly hitting her head. A home health aide found her on the ground and EMS was called. Sydney Gillespie's granddaughter at bedside states that over the last several weeks, Sydney Gillespie has had intermittent dizziness that has been difficult for them to discern from her usual vertigo. She denies any prior loss of consciousness over the last few weeks. Sydney Gillespie also endorses frequent falls but states that it is usually due to bilateral lower extremity weakness that is chronic and unchanged. She denies any known fevers, chills, abdominal pain, diarrhea, shortness of breath, cough, chest pain or palpitations. She notes one-time episode  of nausea with vomiting yesterday with no further episodes of nausea afterwards.   4/24.  Sydney Gillespie lethargic this morning.  MRI of the brain negative for acute intracranial abnormality 4/25.  Sydney Gillespie's mental status much improved.  With hemoglobin dropping down to 7.2 we will give a unit of packed red blood cells.  Continue hydration for acute kidney injury.  Sodium bicarb tablets added. 4/26.  Hemoglobin responded to blood transfusion with hemoglobin up at 9.1.  Sodium bicarb still low at 16 and will increase sodium bicarb tablets to 3 times daily dosing.  Sydney Gillespie orthostatic today so midodrine added.  Sydney Gillespie again had low sugar this morning. 4/27.  CO2 down to 13.  Change IV fluids over to sodium bicarb drip.  Discontinue bicarb tablets.  Metabolic acidosis could be secondary to acute kidney injury and then treatment with saline. 4/28.  CO2 up to 17 and creatinine down to 1.33.  Continue bicarb drip today. 4/29.  CO2 up to 24 and creatinine down to 1.2.  Hemoglobin 8.5.  Sydney Gillespie feeling better.  Stable for discharge out to rehab.  Bicarbonate drip discontinued early morning.  Assessment and Plan: * Acute kidney injury superimposed on CKD (HCC) Acute kidney injury on CKD stage IIIb.  Will continue gentle IV fluid hydration.  Creatinine 2.23 on presentation.  Creatinine down to 1.20.  Creatinine better than baseline.  Metabolic acidosis Likely secondary to acute kidney injury.  Potentially worsened by treatment with saline.  CO2 up to 24 on chemistry today.  Discontinue bicarb drip.  Orthostatic hypotension Continue midodrine.  Continue to check orthostatic vital signs at rehab.  Hypoglycemia As per granddaughter Sydney Gillespie eating better.  Resolved  after eating better.  MSSA bacteremia Continue cefazolin as per ID through 09/17/2022.  SIRS (systemic inflammatory response syndrome) (HCC) Presenting with hypothermia, hypotension, and elevated WBC.  Continue cefazolin as per ID.  So far blood  cultures negative for 5 days.  Lethargy Much improved.  Continue to hold Ditropan and Myrbetriq.  Decrease dose of Requip.  Syncope Secondary to orthostatic hypotension.  Hyponatremia Last sodium 134  Anemia of chronic disease With ferritin up at 377.  Responded to blood transfusion for hemoglobin of 7.2.  Last hemoglobin 8.5.  Stopping IV fluids today will also help out hemoglobin.  History of DVT (deep vein thrombosis) During admission for COVID-19 in December 2023, Sydney Gillespie was found to have a nonocclusive thrombus in the posterior tibial vein of the right calf.  Doppler in March 2024 demonstrated persistent DVT presence.  - Continue home Eliquis  Rheumatoid arthritis (HCC) - Continue home Plaquenil  Hypothyroidism TSH elevated at 36.7.  Increased dose of levothyroxine to 137 mcg.  Multiple myeloma (HCC) Previously treated with daratumumab with plans to start Revlimid, but had to be placed on hold due to multiple admissions since December 2023.  - Continue outpatient follow-up with oncology  Palpitations Continue to monitor.         Consultants: Infectious disease Procedures performed: None Disposition: Rehabilitation facility Diet recommendation:  Regular diet DISCHARGE MEDICATION: Allergies as of 09/07/2022       Reactions   Isoniazid Other (See Comments)   Blisters         Medication List     STOP taking these medications    atenolol 50 MG tablet Commonly known as: TENORMIN   ceFAZolin 10 g injection Commonly known as: ANCEF Replaced by: ceFAZolin  IVPB   DULoxetine 20 MG capsule Commonly known as: CYMBALTA   Myrbetriq 50 MG Tb24 tablet Generic drug: mirabegron ER   oxybutynin 10 MG 24 hr tablet Commonly known as: DITROPAN-XL   tiZANidine 2 MG tablet Commonly known as: ZANAFLEX   triamterene-hydrochlorothiazide 37.5-25 MG tablet Commonly known as: MAXZIDE-25       TAKE these medications    acetaminophen 325 MG tablet Commonly  known as: TYLENOL Take 2 tablets (650 mg total) by mouth every 6 (six) hours as needed for mild pain (or Fever >/= 101).   allopurinol 300 MG tablet Commonly known as: ZYLOPRIM Take 300 mg by mouth daily.   ALPRAZolam 0.25 MG tablet Commonly known as: XANAX Take 1 tablet (0.25 mg total) by mouth 2 (two) times daily as needed. Once daily for anxiety or sleep What changed: See the new instructions.   CALCIUM MAGNESIUM ZINC PO Take 1 tablet by mouth 2 (two) times daily. 1 am, 2 hs   ceFAZolin  IVPB Commonly known as: ANCEF Inject 2 g into the vein every 12 (twelve) hours for 13 days. Indication:  MSSA bacteremia and discitis First Dose: Yes Last Day of Therapy:  09/17/2022 Labs - Once weekly:  CBC/D, CMP, ESR and CRP Please pull PIC at completion of IV antibiotics Fax weekly lab results  promptly to [phone] Method of administration: IV Push Method of administration may be changed at the discretion of home infusion pharmacist based upon assessment of the Sydney Gillespie and/or caregiver's ability to self-administer the medication ordered. Replaces: ceFAZolin 10 g injection   cyanocobalamin 1000 MCG tablet Commonly known as: VITAMIN B12 Take 1,000 mcg by mouth daily.   docusate sodium 100 MG capsule Commonly known as: COLACE Take 1 capsule (100 mg total) by mouth  2 (two) times daily.   Eliquis 5 MG Tabs tablet Generic drug: apixaban TAKE 1 TABLET(5 MG) BY MOUTH TWICE DAILY What changed: See the new instructions.   feeding supplement Liqd Take 237 mLs by mouth 3 (three) times daily between meals.   hydroxychloroquine 200 MG tablet Commonly known as: PLAQUENIL Take 200 mg by mouth See admin instructions. Sydney Gillespie currently takes 200 mg twice daily Monday through Friday and 200 mg once daily Saturday and Sunday.   Ipratropium-Albuterol 20-100 MCG/ACT Aers respimat Commonly known as: COMBIVENT Inhale 1 puff into the lungs every 6 (six) hours as needed for wheezing.   iron  polysaccharides 150 MG capsule Commonly known as: NIFEREX Take 1 capsule (150 mg total) by mouth daily.   lansoprazole 30 MG capsule Commonly known as: PREVACID Take 1 capsule (30 mg total) by mouth 2 (two) times daily before a meal.   levothyroxine 137 MCG tablet Commonly known as: SYNTHROID Take 1 tablet (137 mcg total) by mouth daily at 6 (six) AM. Start taking on: September 08, 2022 What changed:  medication strength how much to take when to take this   midodrine 5 MG tablet Commonly known as: PROAMATINE Take 1 tablet (5 mg total) by mouth 3 (three) times daily with meals.   multivitamin with minerals Tabs tablet Take 1 tablet by mouth daily. Start taking on: September 08, 2022   nystatin-triamcinolone cream Commonly known as: MYCOLOG II Apply 1 Application topically 2 (two) times daily as needed (itching).   OLANZapine 10 MG tablet Commonly known as: ZYPREXA Take 1 tablet (10 mg total) by mouth at bedtime.   polyethylene glycol 17 g packet Commonly known as: MIRALAX / GLYCOLAX Take 17 g by mouth daily. Start taking on: September 08, 2022   Potassium 99 MG Tabs Take 1 tablet by mouth daily.   rOPINIRole 0.25 MG tablet Commonly known as: REQUIP Take 1 tablet (0.25 mg total) by mouth at bedtime. What changed:  medication strength how much to take   senna-docusate 8.6-50 MG tablet Commonly known as: Senokot-S Take 1 tablet by mouth 2 (two) times daily. Hold if having loose or frequent stools. What changed:  when to take this reasons to take this   sertraline 50 MG tablet Commonly known as: ZOLOFT Take 1 tablet (50 mg total) by mouth daily.   sodium chloride flush 0.9 % Soln Commonly known as: NS Inject 3 mLs into the vein every 12 (twelve) hours.   Spacer/Aero-Holding Chambers Devi 1 each by Does not apply route as needed.   vitamin C 1000 MG tablet Take 1,000 mg by mouth daily.   Vitamin D-3 125 MCG (5000 UT) Tabs Take 5,000 Units by mouth daily.   zinc  gluconate 50 MG tablet Take 50 mg by mouth daily.               Discharge Care Instructions  (From admission, onward)           Start     Ordered   09/07/22 0000  Change dressing on IV access line weekly and PRN  (Home infusion instructions - [facility] )        04 /29/24 1104            Follow-up Information     doctor at facility Follow up in 1 day(s).                 Discharge Exam: Filed Weights   09/05/22 0439 09/06/22 0417 09/07/22 0500  Weight:  60.8 kg 60.8 kg 66.3 kg   Physical Exam HENT:     Head: Normocephalic.     Mouth/Throat:     Pharynx: No oropharyngeal exudate.  Eyes:     General: Lids are normal.     Conjunctiva/sclera: Conjunctivae normal.  Cardiovascular:     Rate and Rhythm: Normal rate and regular rhythm.     Heart sounds: Normal heart sounds, S1 normal and S2 normal.  Pulmonary:     Breath sounds: Normal breath sounds. No decreased breath sounds, wheezing, rhonchi or rales.  Abdominal:     Palpations: Abdomen is soft.     Tenderness: There is no abdominal tenderness.  Musculoskeletal:     Right lower leg: No swelling.     Left lower leg: No swelling.  Skin:    General: Skin is warm.     Findings: No rash.  Neurological:     Mental Status: She is alert.     Comments: Able to straight leg raise      Condition at discharge: stable  The results of significant diagnostics from this hospitalization (including imaging, microbiology, ancillary and laboratory) are listed below for reference.   Imaging Studies: MR BRAIN WO CONTRAST  Result Date: 09/03/2022 CLINICAL DATA:  Initial evaluation for mental status change, unknown cause. EXAM: MRI HEAD WITHOUT CONTRAST TECHNIQUE: Multiplanar, multiecho pulse sequences of the brain and surrounding structures were obtained without intravenous contrast. COMPARISON:  Prior CT from 09/01/2022. FINDINGS: Brain: Cerebral volume within normal limits for age. Patchy T2/FLAIR  hyperintensity involving the periventricular deep white matter both cerebral hemispheres, consistent with chronic small vessel ischemic disease, mild for age. No evidence for acute or subacute infarct. Gray-white matter differentiation maintained. No areas of chronic cortical infarction. No acute or chronic intracranial blood products. No mass lesion, midline shift or mass effect. No hydrocephalus or extra-axial fluid collection. Pituitary gland and suprasellar region within normal limits. Vascular: Major intracranial vascular flow voids are maintained. Mild torcular inversion noted. Skull and upper cervical spine: Craniocervical junction within normal limits. Bone marrow signal intensity normal. No scalp soft tissue abnormality. Sinuses/Orbits: Prior bilateral ocular lens replacement. Paranasal sinuses are largely clear. No significant mastoid effusion. Other: None. IMPRESSION: 1. No acute intracranial abnormality. 2. Mild chronic microvascular ischemic disease for age. Electronically Signed   By: Rise Mu M.D.   On: 09/03/2022 01:52   ECHOCARDIOGRAM LIMITED  Result Date: 09/02/2022    ECHOCARDIOGRAM LIMITED REPORT   Sydney Gillespie Name:   KATURAH KARAPETIAN Date of Exam: 09/01/2022 Medical Rec #:  [id number]          Height:       65.0 in Accession #:    [id number]         Weight:       121.3 lb Date of Birth:  [birth year]           BSA:          1.599 m Sydney Gillespie Age:    82 years           BP:           97/48 mmHg Sydney Gillespie Gender: F                  HR:           64 bpm. Exam Location:  [facility] Procedure: 2D Echo, Cardiac Doppler, Limited Echo and Limited Color Doppler Indications:     R55 Syncope  History:  Sydney Gillespie has prior history of Echocardiogram examinations, most                  recent 08/09/2022. Arrythmias:Palpitations; Risk                  Factors:Dyslipidemia.  Sonographer:     Daphine Deutscher RDCS Referring Phys:  [id number] Verdene Lennert Diagnosing Phys: Alwyn Pea MD IMPRESSIONS   1. Left ventricular ejection fraction, by estimation, is 50 to 55%. The left ventricle has low normal function. The left ventricle demonstrates regional wall motion abnormalities (see scoring diagram/findings for description). Left ventricular diastolic  parameters were normal.  2. Right ventricular systolic function is normal. The right ventricular size is normal.  3. Left atrial size was mildly dilated.  4. A small pericardial effusion is present.  5. The mitral valve is normal in structure. Trivial mitral valve regurgitation.  6. The aortic valve is calcified. Aortic valve regurgitation is mild to moderate. Aortic valve sclerosis/calcification is present, without any evidence of aortic stenosis. FINDINGS  Left Ventricle: Left ventricular ejection fraction, by estimation, is 50 to 55%. The left ventricle has low normal function. The left ventricle demonstrates regional wall motion abnormalities. The left ventricular internal cavity size was normal in size. There is no left ventricular hypertrophy. Left ventricular diastolic parameters were normal. Right Ventricle: The right ventricular size is normal. No increase in right ventricular wall thickness. Right ventricular systolic function is normal. Left Atrium: Left atrial size was mildly dilated. Right Atrium: Right atrial size was not assessed. Pericardium: A small pericardial effusion is present. Mitral Valve: The mitral valve is normal in structure. Trivial mitral valve regurgitation. Tricuspid Valve: The tricuspid valve is normal in structure. Tricuspid valve regurgitation is mild. Aortic Valve: The aortic valve is calcified. Aortic valve regurgitation is mild to moderate. Aortic regurgitation PHT measures 572 msec. Aortic valve sclerosis/calcification is present, without any evidence of aortic stenosis. Pulmonic Valve: The pulmonic valve was normal in structure. Pulmonic valve regurgitation is not visualized. Aorta: The ascending aorta was not well visualized.  IAS/Shunts: The interatrial septum was not well visualized. Additional Comments: There is no pleural effusion.  LEFT VENTRICLE PLAX 2D LVIDd:         4.10 cm Diastology LVIDs:         3.00 cm LV e' medial:    6.14 cm/s LV PW:         1.00 cm LV E/e' medial:  10.7 LV IVS:        1.00 cm LV e' lateral:   8.22 cm/s                        LV E/e' lateral: 8.0  RIGHT VENTRICLE             IVC RV S prime:     13.40 cm/s  IVC diam: 1.10 cm TAPSE (M-mode): 1.8 cm LEFT ATRIUM         Index LA diam:    4.60 cm 2.88 cm/m  AORTIC VALVE AI PHT:      572 msec  AORTA Ao Root diam: 3.40 cm MITRAL VALVE               TRICUSPID VALVE MV Area (PHT): 2.95 cm    TR Peak grad:   14.7 mmHg MV Decel Time: 257 msec    TR Vmax:        192.00 cm/s MV E velocity: 65.50 cm/s MV A velocity:  90.70 cm/s MV E/A ratio:  0.72 Alwyn Pea MD Electronically signed by Alwyn Pea MD Signature Date/Time: 09/02/2022/7:27:49 AM    Final    DG Chest Port 1 View  Result Date: 09/02/2022 CLINICAL DATA:  PICC placement EXAM: PORTABLE CHEST 1 VIEW COMPARISON:  Yesterday FINDINGS: Right upper extremity PICC with tip at the SVC. Cardiomegaly. Streaky opacity in the bilateral lungs. No visible effusion or pneumothorax. Chronic cardiomegaly. Multilevel cement augmentation in the thoracic spine. Gas distended upper abdominal colon. IMPRESSION: 1. Right upper extremity PICC with tip at the SVC. 2. Cardiomegaly and borderline vascular congestion. Atelectatic type opacity in the bilateral lungs. Electronically Signed   By: Tiburcio Pea M.D.   On: 09/02/2022 04:34   US RENAL  Result Date: 09/01/2022 CLINICAL DATA:  [id number] AKI (acute kidney injury) [id number] EXAM: RENAL / URINARY TRACT ULTRASOUND COMPLETE COMPARISON:  11/04/2017 FINDINGS: Right Kidney: Renal measurements: 9.6 x 4.5 x 4.6 cm = volume: 102 mL. Parenchyma echogenic compared to adjacent liver. No focal lesion or hydronephrosis. Left Kidney: Renal measurements: 8.2 x 4.2 x 3.7 cm =  volume: 67 mL. No hydronephrosis. Exophytic 3.1 x 2.7 x 2.9 cm cyst from lower pole as before. Bladder: Incompletely distended.  Bilateral ureteral jets demonstrated. Other: Bilateral pleural effusions incidentally noted. IMPRESSION: 1. No hydronephrosis. 2. Echogenic renal parenchyma suggesting medical renal disease. 3. 3.1 cm left renal cyst. 4. Bilateral pleural effusions. Electronically Signed   By: Corlis Leak M.D.   On: 09/01/2022 18:35   DG Chest Portable 1 View  Result Date: 09/01/2022 CLINICAL DATA:  Recent pneumonia EXAM: PORTABLE CHEST 1 VIEW COMPARISON:  08/07/2022 FINDINGS: Previously noted bibasilar and right lung opacities are improved, with some residual opacities in the mid to lower right lung and lower left lung, which are somewhat linear and may reflect atelectasis or infection. Unchanged cardiac and mediastinal contours. Aortic atherosclerosis. No pleural effusion or pneumothorax. No acute osseous abnormality. Prior ACDF. IMPRESSION: Previously noted bibasilar and right lung opacities are improved, with some residual opacities in the mid to lower right lung and lower left lung, which may reflect atelectasis and/or infection. Electronically Signed   By: Wiliam Ke M.D.   On: 09/01/2022 15:26   CT HEAD WO CONTRAST ( )  Result Date: 09/01/2022 CLINICAL DATA:  Trauma EXAM: CT HEAD WITHOUT CONTRAST TECHNIQUE: Contiguous axial images were obtained from the base of the skull through the vertex without intravenous contrast. RADIATION DOSE REDUCTION: This exam was performed according to the departmental dose-optimization program which includes automated exposure control, adjustment of the mA and/or kV according to Sydney Gillespie size and/or use of iterative reconstruction technique. COMPARISON:  None Available. FINDINGS: Brain: No evidence of acute infarction, hemorrhage, hydrocephalus, extra-axial collection or mass lesion/mass effect. Sequela of moderate chronic microvascular ischemic change.  Vascular: No hyperdense vessel or unexpected calcification. Skull: Normal. Negative for fracture or focal lesion. Sinuses/Orbits: No mastoid or middle ear effusion. Paranasal sinuses are notable for frothy secretions in the bilateral sphenoid sinuses, which can be seen in the setting sinusitis. Bilateral lens replacement. Orbits are otherwise unremarkable. Other: None. IMPRESSION: 1. No acute intracranial abnormality. 2. Frothy secretions in the bilateral sphenoid sinuses can be seen in the setting of acute sinusitis. Electronically Signed   By: Lorenza Cambridge M.D.   On: 09/01/2022 14:36   MM 3D SCREEN BREAST BILATERAL  Result Date: 08/18/2022 CLINICAL DATA:  Screening. EXAM: DIGITAL SCREENING BILATERAL MAMMOGRAM WITH TOMOSYNTHESIS AND CAD TECHNIQUE: Bilateral screening digital craniocaudal and mediolateral oblique mammograms  were obtained. Bilateral screening digital breast tomosynthesis was performed. The images were evaluated with computer-aided detection. COMPARISON:  Previous exam(s). ACR Breast Density Category b: There are scattered areas of fibroglandular density. FINDINGS: There are no findings suspicious for malignancy. IMPRESSION: No mammographic evidence of malignancy. A result letter of this screening mammogram will be mailed directly to the Sydney Gillespie. RECOMMENDATION: Screening mammogram in one year. (Code:SM-B-01Y) BI-RADS CATEGORY  1: Negative. Electronically Signed   By: Frederico Hamman M.D.   On: 08/18/2022 15:10   [place] EKG SITE RITE  Result Date: 08/11/2022 If Site Rite image not attached, placement could not be confirmed due to current cardiac rhythm.  ECHOCARDIOGRAM COMPLETE  Result Date: 08/09/2022    ECHOCARDIOGRAM REPORT   Sydney Gillespie Name:   LAMYIA CDEBACA Date of Exam: 08/09/2022 Medical Rec #:  [id number]          Height:       65.0 in Accession #:    [id number]         Weight:       132.0 lb Date of Birth:  1940-08-17           BSA:          1.658 m Sydney Gillespie Age:    82 years            BP:           167/91 mmHg Sydney Gillespie Gender: F                  HR:           102 bpm. Exam Location:  [facility] Procedure: 2D Echo Indications:     Bacteremia R78.81  History:         Sydney Gillespie has prior history of Echocardiogram examinations, most                  recent 05/07/2022.  Sonographer:     Overton Mam RDCS Referring Phys:  [id number] Inetta Fermo LAI Diagnosing Phys: Dina Rich MD  Sonographer Comments: Suboptimal subcostal window. Image acquisition challenging due to respiratory motion. IMPRESSIONS  1. Left ventricular ejection fraction, by estimation, is 35 to 40%. The left ventricle has moderately decreased function. The left ventricle demonstrates global hypokinesis. There is mild left ventricular hypertrophy. Left ventricular diastolic parameters are indeterminate. Elevated left atrial pressure.  2. Right ventricular systolic function is normal. The right ventricular size is normal.  3. Left atrial size was severely dilated.  4. The mitral valve is abnormal. Moderate mitral valve regurgitation. No evidence of mitral stenosis.  5. The tricuspid valve is abnormal.  6. The aortic valve is tricuspid. There is mild calcification of the aortic valve. There is mild thickening of the aortic valve. Aortic valve regurgitation is moderate. No aortic stenosis is present. FINDINGS  Left Ventricle: Left ventricular ejection fraction, by estimation, is 35 to 40%. The left ventricle has moderately decreased function. The left ventricle demonstrates global hypokinesis. The left ventricular internal cavity size was normal in size. There is mild left ventricular hypertrophy. Left ventricular diastolic parameters are indeterminate. Elevated left atrial pressure. Right Ventricle: Unable to estimated PASP, IVC poorly visualized. The right ventricular size is normal. Right vetricular wall thickness was not well visualized. Right ventricular systolic function is normal. Left Atrium: Left atrial size was severely dilated. Right  Atrium: Right atrial size was normal in size. Pericardium: The pericardium was not well visualized. Mitral Valve: The mitral valve is abnormal. Mild mitral annular calcification. Moderate mitral valve regurgitation.  No evidence of mitral valve stenosis. MV peak gradient, 10.2 mmHg. The mean mitral valve gradient is 5.0 mmHg. Tricuspid Valve: The tricuspid valve is abnormal. Tricuspid valve regurgitation is mild . No evidence of tricuspid stenosis. Aortic Valve: The aortic valve is tricuspid. There is mild calcification of the aortic valve. There is mild thickening of the aortic valve. There is mild aortic valve annular calcification. Aortic valve regurgitation is moderate. Aortic regurgitation PHT  measures 297 msec. No aortic stenosis is present. Aortic valve mean gradient measures 6.3 mmHg. Aortic valve peak gradient measures 12.1 mmHg. Aortic valve area, by VTI measures 1.35 cm. Pulmonic Valve: The pulmonic valve was not well visualized. Pulmonic valve regurgitation is not visualized. No evidence of pulmonic stenosis. Aorta: The aortic root is normal in size and structure. Venous: The inferior vena cava was not well visualized. IAS/Shunts: The interatrial septum was not well visualized.  LEFT VENTRICLE PLAX 2D LVIDd:         3.60 cm      Diastology LVIDs:         3.00 cm      LV e' medial:    5.55 cm/s LV PW:         1.10 cm      LV E/e' medial:  20.0 LV IVS:        1.10 cm      LV e' lateral:   6.31 cm/s LVOT diam:     1.90 cm      LV E/e' lateral: 17.6 LV SV:         43 LV SV Index:   26 LVOT Area:     2.84 cm  LV Volumes (MOD) LV vol d, MOD A2C: 66.3 ml LV vol d, MOD A4C: 104.0 ml LV vol s, MOD A2C: 34.4 ml LV vol s, MOD A4C: 61.7 ml LV SV MOD A2C:     31.9 ml LV SV MOD A4C:     104.0 ml LV SV MOD BP:      34.2 ml RIGHT VENTRICLE RV Basal diam:  2.00 cm RV S prime:     18.40 cm/s TAPSE (M-mode): 1.6 cm LEFT ATRIUM             Index        RIGHT ATRIUM          Index LA diam:        5.40 cm 3.26 cm/m   RA  Area:     8.97 cm LA Vol (A2C):   92.6 ml 55.85 ml/m  RA Volume:   14.70 ml 8.87 ml/m LA Vol (A4C):   89.6 ml 54.04 ml/m LA Biplane Vol: 95.0 ml 57.30 ml/m  AORTIC VALVE                     PULMONIC VALVE AV Area (Vmax):    1.38 cm      PV Vmax:        1.03 m/s AV Area (Vmean):   1.34 cm      PV Peak grad:   4.2 mmHg AV Area (VTI):     1.35 cm      RVOT Peak grad: 4 mmHg AV Vmax:           173.84 cm/s AV Vmean:          120.488 cm/s AV VTI:            0.321 m AV Peak Grad:      12.1  mmHg AV Mean Grad:      6.3 mmHg LVOT Vmax:         84.90 cm/s LVOT Vmean:        56.900 cm/s LVOT VTI:          0.153 m LVOT/AV VTI ratio: 0.48 AI PHT:            297 msec  AORTA Ao Root diam: 3.20 cm Ao Asc diam:  2.90 cm MITRAL VALVE                TRICUSPID VALVE MV Area (PHT): 7.59 cm     TR Peak grad:   34.1 mmHg MV Area VTI:   1.37 cm     TR Vmax:        292.00 cm/s MV Peak grad:  10.2 mmHg MV Mean grad:  5.0 mmHg     SHUNTS MV Vmax:       1.60 m/s     Systemic VTI:  0.15 m MV Vmean:      103.0 cm/s   Systemic Diam: 1.90 cm MV Decel Time: 100 msec MR Peak grad: 156.8 mmHg MR Mean grad: 105.0 mmHg MR Vmax:      626.00 cm/s MR Vmean:     483.0 cm/s MV E velocity: 111.00 cm/s MV A velocity: 142.00 cm/s MV E/A ratio:  0.78 Dina Rich MD Electronically signed by Dina Rich MD Signature Date/Time: 08/09/2022/10:45:10 AM    Final     Microbiology: Results for orders placed or performed during the hospital encounter of 09/01/22  Resp panel by RT-PCR (RSV, Flu A&B, Covid) Anterior Nasal Swab     Status: None   Collection Time: 09/01/22  3:12 PM   Specimen: Anterior Nasal Swab  Result Value Ref Range Status   SARS Coronavirus 2 by RT PCR NEGATIVE NEGATIVE Final    Comment: (NOTE) SARS-CoV-2 target nucleic acids are NOT DETECTED.  The SARS-CoV-2 RNA is generally detectable in upper respiratory specimens during the acute phase of infection. The lowest concentration of SARS-CoV-2 viral copies this assay can  detect is 138 copies/mL. A negative result does not preclude SARS-Cov-2 infection and should not be used as the sole basis for treatment or other Sydney Gillespie management decisions. A negative result may occur with  improper specimen collection/handling, submission of specimen other than nasopharyngeal swab, presence of viral mutation(s) within the areas targeted by this assay, and inadequate number of viral copies(<138 copies/mL). A negative result must be combined with clinical observations, Sydney Gillespie history, and epidemiological information. The expected result is Negative.  Fact Sheet for Patients:  BloggerCourse.com  Fact Sheet for Healthcare Providers:  SeriousBroker.it  This test is no t yet approved or cleared by the [place] FDA and  has been authorized for detection and/or diagnosis of SARS-CoV-2 by FDA under an Emergency Use Authorization (EUA). This EUA will remain  in effect (meaning this test can be used) for the duration of the COVID-19 declaration under Section 564(b)(1) of the Act, 21 U.S.C.section 360bbb-3(b)(1), unless the authorization is terminated  or revoked sooner.       Influenza A by PCR NEGATIVE NEGATIVE Final   Influenza B by PCR NEGATIVE NEGATIVE Final    Comment: (NOTE) The Xpert Xpress SARS-CoV-2/FLU/RSV plus assay is intended as an aid in the diagnosis of influenza from Nasopharyngeal swab specimens and should not be used as a sole basis for treatment. Nasal washings and aspirates are unacceptable for Xpert Xpress SARS-CoV-2/FLU/RSV testing.  Fact Sheet for Patients:  BloggerCourse.com  Fact Sheet for Healthcare Providers: SeriousBroker.it  This test is not yet approved or cleared by the [place] FDA and has been authorized for detection and/or diagnosis of SARS-CoV-2 by FDA under an Emergency Use Authorization (EUA). This EUA will remain in  effect (meaning this test can be used) for the duration of the COVID-19 declaration under Section 564(b)(1) of the Act, 21 U.S.C. section 360bbb-3(b)(1), unless the authorization is terminated or revoked.     Resp Syncytial Virus by PCR NEGATIVE NEGATIVE Final    Comment: (NOTE) Fact Sheet for Patients: BloggerCourse.com  Fact Sheet for Healthcare Providers: SeriousBroker.it  This test is not yet approved or cleared by the [place] FDA and has been authorized for detection and/or diagnosis of SARS-CoV-2 by FDA under an Emergency Use Authorization (EUA). This EUA will remain in effect (meaning this test can be used) for the duration of the COVID-19 declaration under Section 564(b)(1) of the Act, 21 U.S.C. section 360bbb-3(b)(1), unless the authorization is terminated or revoked.  Performed at [facility], [address] [place], [place] [address]   Blood Culture (routine x 2)     Status: None   Collection Time: 09/01/22  3:47 PM   Specimen: BLOOD  Result Value Ref Range Status   Specimen Description BLOOD LEFT ANTECUBITAL  Final   Special Requests   Final    BOTTLES DRAWN AEROBIC AND ANAEROBIC Blood Culture results may not be optimal due to an excessive volume of blood received in culture bottles   Culture   Final    NO GROWTH 5 DAYS Performed at [facility], [address] [place], [place] [address]    Report Status 09/06/2022 FINAL  Final  Blood Culture (routine x 2)     Status: None   Collection Time: 09/01/22  3:47 PM   Specimen: BLOOD  Result Value Ref Range Status   Specimen Description BLOOD BLOOD LEFT FOREARM  Final   Special Requests   Final    BOTTLES DRAWN AEROBIC AND ANAEROBIC Blood Culture results may not be optimal due to an inadequate volume of blood received in culture bottles   Culture   Final    NO GROWTH 5 DAYS Performed at [facility], [address]  [place], [place] [address]    Report Status 09/06/2022 FINAL  Final    Labs: CBC: Recent Labs  Lab 09/01/22 1300 09/02/22 0515 09/03/22 0513 09/04/22 0540 09/05/22 0754 09/06/22 0543 09/07/22 0605  WBC 12.2* 12.2* 11.0* 10.2 10.3  --   --   NEUTROABS  --  9.1*  --   --   --   --   --   HGB 9.2* 7.9* 7.2* 9.1* 9.3* 9.1* 8.5*  HCT 29.4* 25.4* 23.2* 28.2* 28.6*  --   --   MCV 97.0 96.6 96.3 94.0 92.9  --   --   PLT 267 240 211 214 210  --   --    Basic Metabolic Panel: Recent Labs  Lab 09/03/22 0513 09/04/22 0540 09/05/22 0754 09/06/22 0543 09/07/22 0605  NA 131* 135 134* 134* 136  K 4.2 4.1 3.9 3.9 3.4*  CL 103 105 107 104 100  CO2 16* 16* 13* 17* 24  GLUCOSE 75 64* 95 82 84  BUN 49* 42* 37* 31* 30*  CREATININE 1.98* 1.71* 1.47* 1.33* 1.20*  CALCIUM 8.2* 8.1* 7.9* 7.8* 7.8*    CBG: Recent Labs  Lab 09/04/22 3086 09/05/22 5784 09/05/22 0801 09/06/22 0448 09/07/22 6962  GLUCAP 78 78 78 81 81    Discharge time spent: greater than 30 minutes.  Signed: Alford Highland, MD [facility] 09/07/2022

## 2022-09-07 NOTE — TOC Transition Note (Signed)
Transition of Care [facility]) - CM/SW Discharge Note   Patient Details  Name: Sydney Gillespie MRN: [id number] Date of Birth: 1940-10-16  Transition of Care [facility]) CM/SW Contact:  Chapman Fitch, RN Phone Number: 09/07/2022, 2:20 PM   Clinical Narrative:      Patient will DC to: [facility] Anticipated DC date: 09/07/22  Family notified:[name] Transport by: [name] granddaughter   Per MD patient ready for DC to . RN, patient, patient's family, and facility notified of DC. Discharge Summary sent to facility. RN given number for report. DC packet on chart, signed DNR in DC  packet .   TOC signing off.  Bevelyn Ngo [facility] [phone]    Barriers to Discharge: Continued Medical Work up   Patient Goals and CMS Choice      Discharge Placement                         Discharge Plan and Services Additional resources added to the After Visit Summary for                                       Social Determinants of Health (SDOH) Interventions SDOH Screenings   Food Insecurity: No Food Insecurity (09/04/2022)  Housing: Low Risk  (09/04/2022)  Transportation Needs: No Transportation Needs (08/05/2022)  Utilities: Not At Risk (08/05/2022)  Tobacco Use: Low Risk  (09/04/2022)     Readmission Risk Interventions    09/03/2022    3:44 PM 08/06/2022    3:47 PM 05/10/2022    2:56 PM  Readmission Risk Prevention Plan  Transportation Screening Complete Complete Complete  Medication Review Oceanographer) Complete Complete Complete  PCP or Specialist appointment within 3-5 days of discharge  Complete Complete  HRI or Home Care Consult Complete  Complete  SW Recovery Care/Counseling Consult Complete Complete Complete  Palliative Care Screening Complete Not Applicable Not Applicable  Skilled Nursing Facility Complete Not Applicable Not Applicable

## 2022-09-07 NOTE — TOC Progression Note (Signed)
Transition of Care [facility]) - Progression Note    Patient Details  Name: Sydney Gillespie MRN: [id number] Date of Birth: Oct 02, 1940  Transition of Care [facility]) CM/SW Contact  Chapman Fitch, RN Phone Number: 09/07/2022, 11:03 AM  Clinical Narrative:     Per Tam[facility] at [facility] patient can admit today.  MD notified   Expected Discharge Plan: Skilled Nursing Facility Barriers to Discharge: Continued Medical Work up  Expected Discharge Plan and Services       Living arrangements for the past 2 months: Single Family Home                                       Social Determinants of Health (SDOH) Interventions SDOH Screenings   Food Insecurity: No Food Insecurity (09/04/2022)  Housing: Low Risk  (09/04/2022)  Transportation Needs: No Transportation Needs (08/05/2022)  Utilities: Not At Risk (08/05/2022)  Tobacco Use: Low Risk  (09/04/2022)    Readmission Risk Interventions    09/03/2022    3:44 PM 08/06/2022    3:47 PM 05/10/2022    2:56 PM  Readmission Risk Prevention Plan  Transportation Screening Complete Complete Complete  Medication Review Oceanographer) Complete Complete Complete  PCP or Specialist appointment within 3-5 days of discharge  Complete Complete  HRI or Home Care Consult Complete  Complete  SW Recovery Care/Counseling Consult Complete Complete Complete  Palliative Care Screening Complete Not Applicable Not Applicable  Skilled Nursing Facility Complete Not Applicable Not Applicable

## 2022-09-17 ENCOUNTER — Emergency Department: Payer: Medicare Other

## 2022-09-17 ENCOUNTER — Inpatient Hospital Stay: Payer: BLUE CROSS/BLUE SHIELD | Admitting: Infectious Diseases

## 2022-09-17 ENCOUNTER — Other Ambulatory Visit: Payer: Self-pay

## 2022-09-17 ENCOUNTER — Inpatient Hospital Stay
Admission: EM | Admit: 2022-09-17 | Discharge: 2022-09-23 | DRG: 291 | Disposition: A | Discharge status: Skilled Nursing Facility | Payer: Medicare Other | Source: Skilled Nursing Facility | Attending: Family Medicine | Admitting: Family Medicine

## 2022-09-17 ENCOUNTER — Observation Stay
Admit: 2022-09-17 | Discharge: 2022-09-17 | Disposition: A | Discharge status: Home / Self Care | Payer: Medicare Other | Attending: Cardiology | Admitting: Cardiology

## 2022-09-17 DIAGNOSIS — R7881 Bacteremia: Secondary | ICD-10-CM | POA: Diagnosis not present

## 2022-09-17 DIAGNOSIS — K219 Gastro-esophageal reflux disease without esophagitis: Secondary | ICD-10-CM | POA: Diagnosis present

## 2022-09-17 DIAGNOSIS — E039 Hypothyroidism, unspecified: Secondary | ICD-10-CM | POA: Diagnosis present

## 2022-09-17 DIAGNOSIS — I5033 Acute on chronic diastolic (congestive) heart failure: Secondary | ICD-10-CM | POA: Diagnosis present

## 2022-09-17 DIAGNOSIS — I130 Hypertensive heart and chronic kidney disease with heart failure and stage 1 through stage 4 chronic kidney disease, or unspecified chronic kidney disease: Secondary | ICD-10-CM | POA: Diagnosis present

## 2022-09-17 DIAGNOSIS — F05 Delirium due to known physiological condition: Secondary | ICD-10-CM | POA: Diagnosis present

## 2022-09-17 DIAGNOSIS — I5043 Acute on chronic combined systolic (congestive) and diastolic (congestive) heart failure: Secondary | ICD-10-CM | POA: Diagnosis present

## 2022-09-17 DIAGNOSIS — J9601 Acute respiratory failure with hypoxia: Secondary | ICD-10-CM | POA: Diagnosis not present

## 2022-09-17 DIAGNOSIS — Z9481 Bone marrow transplant status: Secondary | ICD-10-CM

## 2022-09-17 DIAGNOSIS — B9561 Methicillin susceptible Staphylococcus aureus infection as the cause of diseases classified elsewhere: Secondary | ICD-10-CM | POA: Diagnosis present

## 2022-09-17 DIAGNOSIS — F0392 Unspecified dementia, unspecified severity, with psychotic disturbance: Secondary | ICD-10-CM | POA: Diagnosis not present

## 2022-09-17 DIAGNOSIS — I5023 Acute on chronic systolic (congestive) heart failure: Secondary | ICD-10-CM | POA: Diagnosis not present

## 2022-09-17 DIAGNOSIS — J449 Chronic obstructive pulmonary disease, unspecified: Secondary | ICD-10-CM | POA: Diagnosis not present

## 2022-09-17 DIAGNOSIS — Z79899 Other long term (current) drug therapy: Secondary | ICD-10-CM

## 2022-09-17 DIAGNOSIS — Z8616 Personal history of COVID-19: Secondary | ICD-10-CM | POA: Diagnosis not present

## 2022-09-17 DIAGNOSIS — E876 Hypokalemia: Secondary | ICD-10-CM | POA: Diagnosis present

## 2022-09-17 DIAGNOSIS — M069 Rheumatoid arthritis, unspecified: Secondary | ICD-10-CM | POA: Diagnosis not present

## 2022-09-17 DIAGNOSIS — K2270 Barrett's esophagus without dysplasia: Secondary | ICD-10-CM | POA: Diagnosis present

## 2022-09-17 DIAGNOSIS — M4626 Osteomyelitis of vertebra, lumbar region: Secondary | ICD-10-CM | POA: Diagnosis not present

## 2022-09-17 DIAGNOSIS — Z79890 Hormone replacement therapy (postmenopausal): Secondary | ICD-10-CM

## 2022-09-17 DIAGNOSIS — K5641 Fecal impaction: Secondary | ICD-10-CM | POA: Diagnosis not present

## 2022-09-17 DIAGNOSIS — Z23 Encounter for immunization: Secondary | ICD-10-CM | POA: Diagnosis not present

## 2022-09-17 DIAGNOSIS — Z66 Do not resuscitate: Secondary | ICD-10-CM | POA: Diagnosis present

## 2022-09-17 DIAGNOSIS — Z86718 Personal history of other venous thrombosis and embolism: Secondary | ICD-10-CM | POA: Diagnosis not present

## 2022-09-17 DIAGNOSIS — N179 Acute kidney failure, unspecified: Secondary | ICD-10-CM | POA: Diagnosis present

## 2022-09-17 DIAGNOSIS — Z8611 Personal history of tuberculosis: Secondary | ICD-10-CM

## 2022-09-17 DIAGNOSIS — Z888 Allergy status to other drugs, medicaments and biological substances status: Secondary | ICD-10-CM

## 2022-09-17 DIAGNOSIS — J4489 Other specified chronic obstructive pulmonary disease: Secondary | ICD-10-CM | POA: Diagnosis present

## 2022-09-17 DIAGNOSIS — D631 Anemia in chronic kidney disease: Secondary | ICD-10-CM | POA: Diagnosis present

## 2022-09-17 DIAGNOSIS — F0390 Unspecified dementia without behavioral disturbance: Secondary | ICD-10-CM | POA: Diagnosis present

## 2022-09-17 DIAGNOSIS — N184 Chronic kidney disease, stage 4 (severe): Secondary | ICD-10-CM | POA: Diagnosis present

## 2022-09-17 DIAGNOSIS — I82461 Acute embolism and thrombosis of right calf muscular vein: Secondary | ICD-10-CM | POA: Diagnosis present

## 2022-09-17 DIAGNOSIS — Z515 Encounter for palliative care: Secondary | ICD-10-CM | POA: Diagnosis not present

## 2022-09-17 DIAGNOSIS — M109 Gout, unspecified: Secondary | ICD-10-CM | POA: Diagnosis not present

## 2022-09-17 DIAGNOSIS — M810 Age-related osteoporosis without current pathological fracture: Secondary | ICD-10-CM | POA: Diagnosis present

## 2022-09-17 DIAGNOSIS — M064 Inflammatory polyarthropathy: Secondary | ICD-10-CM | POA: Diagnosis present

## 2022-09-17 DIAGNOSIS — C9000 Multiple myeloma not having achieved remission: Secondary | ICD-10-CM | POA: Diagnosis present

## 2022-09-17 DIAGNOSIS — I4581 Long QT syndrome: Secondary | ICD-10-CM | POA: Diagnosis not present

## 2022-09-17 DIAGNOSIS — E785 Hyperlipidemia, unspecified: Secondary | ICD-10-CM | POA: Diagnosis not present

## 2022-09-17 DIAGNOSIS — Z7901 Long term (current) use of anticoagulants: Secondary | ICD-10-CM

## 2022-09-17 DIAGNOSIS — R7989 Other specified abnormal findings of blood chemistry: Secondary | ICD-10-CM | POA: Diagnosis present

## 2022-09-17 DIAGNOSIS — M4634 Infection of intervertebral disc (pyogenic), thoracic region: Secondary | ICD-10-CM | POA: Diagnosis present

## 2022-09-17 DIAGNOSIS — I3139 Other pericardial effusion (noninflammatory): Secondary | ICD-10-CM | POA: Diagnosis present

## 2022-09-17 DIAGNOSIS — I340 Nonrheumatic mitral (valve) insufficiency: Secondary | ICD-10-CM | POA: Diagnosis present

## 2022-09-17 DIAGNOSIS — Z792 Long term (current) use of antibiotics: Secondary | ICD-10-CM

## 2022-09-17 DIAGNOSIS — I2489 Other forms of acute ischemic heart disease: Secondary | ICD-10-CM | POA: Diagnosis present

## 2022-09-17 DIAGNOSIS — Z1152 Encounter for screening for COVID-19: Secondary | ICD-10-CM | POA: Diagnosis not present

## 2022-09-17 DIAGNOSIS — N183 Chronic kidney disease, stage 3 unspecified: Secondary | ICD-10-CM | POA: Diagnosis present

## 2022-09-17 DIAGNOSIS — I272 Pulmonary hypertension, unspecified: Secondary | ICD-10-CM | POA: Diagnosis present

## 2022-09-17 DIAGNOSIS — Z7189 Other specified counseling: Secondary | ICD-10-CM | POA: Diagnosis not present

## 2022-09-17 DIAGNOSIS — G4700 Insomnia, unspecified: Secondary | ICD-10-CM | POA: Diagnosis not present

## 2022-09-17 DIAGNOSIS — Z90710 Acquired absence of both cervix and uterus: Secondary | ICD-10-CM

## 2022-09-17 DIAGNOSIS — I10 Essential (primary) hypertension: Secondary | ICD-10-CM

## 2022-09-17 DIAGNOSIS — E8809 Other disorders of plasma-protein metabolism, not elsewhere classified: Secondary | ICD-10-CM | POA: Diagnosis present

## 2022-09-17 DIAGNOSIS — Z9049 Acquired absence of other specified parts of digestive tract: Secondary | ICD-10-CM

## 2022-09-17 DIAGNOSIS — Z8261 Family history of arthritis: Secondary | ICD-10-CM

## 2022-09-17 DIAGNOSIS — J309 Allergic rhinitis, unspecified: Secondary | ICD-10-CM | POA: Diagnosis present

## 2022-09-17 DIAGNOSIS — Z8249 Family history of ischemic heart disease and other diseases of the circulatory system: Secondary | ICD-10-CM

## 2022-09-17 LAB — ECHOCARDIOGRAM LIMITED
Area-P 1/2: 9.73 cm2
Calc EF: 39.6 %
Height: 65 in
MV M vel: 5.83 m/s
MV Peak grad: 136 mmHg
Radius: 0.5 cm
S' Lateral: 3.7 cm
Single Plane A2C EF: 47.3 %
Single Plane A4C EF: 30.9 %
Weight: 2400 oz

## 2022-09-17 LAB — POTASSIUM: Potassium: 3.8 mmol/L (ref 3.5–5.1)

## 2022-09-17 LAB — CBC WITH DIFFERENTIAL/PLATELET
Abs Immature Granulocytes: 0.09 10*3/uL — ABNORMAL HIGH (ref 0.00–0.07)
Basophils Absolute: 0.1 10*3/uL (ref 0.0–0.1)
Basophils Relative: 1 %
Eosinophils Absolute: 0 10*3/uL (ref 0.0–0.5)
Eosinophils Relative: 0 %
HCT: 25.7 % — ABNORMAL LOW (ref 36.0–46.0)
Hemoglobin: 8 g/dL — ABNORMAL LOW (ref 12.0–15.0)
Immature Granulocytes: 1 %
Lymphocytes Relative: 11 %
Lymphs Abs: 1.1 10*3/uL (ref 0.7–4.0)
MCH: 29.7 pg (ref 26.0–34.0)
MCHC: 31.1 g/dL (ref 30.0–36.0)
MCV: 95.5 fL (ref 80.0–100.0)
Monocytes Absolute: 0.6 10*3/uL (ref 0.1–1.0)
Monocytes Relative: 5 %
Neutro Abs: 8.4 10*3/uL — ABNORMAL HIGH (ref 1.7–7.7)
Neutrophils Relative %: 82 %
Platelets: 241 10*3/uL (ref 150–400)
RBC: 2.69 MIL/uL — ABNORMAL LOW (ref 3.87–5.11)
RDW: 19.2 % — ABNORMAL HIGH (ref 11.5–15.5)
WBC: 10.2 10*3/uL (ref 4.0–10.5)
nRBC: 0 % (ref 0.0–0.2)

## 2022-09-17 LAB — COMPREHENSIVE METABOLIC PANEL
ALT: <5 U/L (ref 0–44)
AST: 35 U/L (ref 15–41)
Albumin: 2.4 g/dL — ABNORMAL LOW (ref 3.5–5.0)
Alkaline Phosphatase: 108 U/L (ref 38–126)
Anion gap: 12 (ref 5–15)
BUN: 14 mg/dL (ref 8–23)
CO2: 21 mmol/L — ABNORMAL LOW (ref 22–32)
Calcium: 7.7 mg/dL — ABNORMAL LOW (ref 8.9–10.3)
Chloride: 107 mmol/L (ref 98–111)
Creatinine, Ser: 0.92 mg/dL (ref 0.44–1.00)
GFR, Estimated: >60 mL/min (ref >60)
Glucose, Bld: 96 mg/dL (ref 70–99)
Potassium: 3 mmol/L — ABNORMAL LOW (ref 3.5–5.1)
Sodium: 140 mmol/L (ref 135–145)
Total Bilirubin: 0.6 mg/dL (ref 0.3–1.2)
Total Protein: 5.3 g/dL — ABNORMAL LOW (ref 6.5–8.1)

## 2022-09-17 LAB — SARS CORONAVIRUS 2 BY RT PCR: SARS Coronavirus 2 by RT PCR: NEGATIVE

## 2022-09-17 LAB — TROPONIN I (HIGH SENSITIVITY)
Troponin I (High Sensitivity): 26 ng/L — ABNORMAL HIGH (ref <18)
Troponin I (High Sensitivity): 31 ng/L — ABNORMAL HIGH (ref <18)

## 2022-09-17 LAB — MAGNESIUM
Magnesium: 1.2 mg/dL — ABNORMAL LOW (ref 1.7–2.4)
Magnesium: 2.1 mg/dL (ref 1.7–2.4)

## 2022-09-17 LAB — LACTIC ACID, PLASMA
Lactic Acid, Venous: 0.9 mmol/L (ref 0.5–1.9)
Lactic Acid, Venous: 0.7 mmol/L (ref 0.5–1.9)

## 2022-09-17 LAB — PROCALCITONIN: Procalcitonin: <0.1 ng/mL

## 2022-09-17 LAB — MRSA NEXT GEN BY PCR, NASAL: MRSA by PCR Next Gen: NOT DETECTED

## 2022-09-17 LAB — CULTURE, BLOOD (ROUTINE X 2)

## 2022-09-17 LAB — BRAIN NATRIURETIC PEPTIDE: B Natriuretic Peptide: 3282.1 pg/mL — ABNORMAL HIGH (ref 0.0–100.0)

## 2022-09-17 MED ORDER — ENOXAPARIN SODIUM 40 MG/0.4ML IJ SOSY: 40.0000 mg | PREFILLED_SYRINGE | Freq: Every day | INTRAMUSCULAR | Status: DC

## 2022-09-17 MED ORDER — ROPINIROLE HCL 0.25 MG PO TABS
0.2500 mg | ORAL_TABLET | Freq: Every day | ORAL | Status: DC
  Administered 2022-09-17 – 2022-09-22 (×6): 0.25 mg via ORAL
  Filled 2022-09-17 (×7): qty 1

## 2022-09-17 MED ORDER — ZINC SULFATE 220 (50 ZN) MG PO CAPS
220.0000 mg | ORAL_CAPSULE | Freq: Every day | ORAL | Status: DC
  Administered 2022-09-17 – 2022-09-23 (×7): 220 mg via ORAL
  Filled 2022-09-17 (×7): qty 1

## 2022-09-17 MED ORDER — ACETAMINOPHEN 325 MG PO TABS
650.0000 mg | ORAL_TABLET | Freq: Four times a day (QID) | ORAL | Status: DC | PRN
  Administered 2022-09-20: 650 mg via ORAL
  Filled 2022-09-17: qty 2

## 2022-09-17 MED ORDER — OLANZAPINE 5 MG PO TABS
10.0000 mg | ORAL_TABLET | Freq: Every day | ORAL | Status: DC
  Administered 2022-09-17 – 2022-09-19 (×3): 10 mg via ORAL
  Filled 2022-09-17 (×3): qty 2

## 2022-09-17 MED ORDER — SERTRALINE HCL 50 MG PO TABS
50.0000 mg | ORAL_TABLET | Freq: Every day | ORAL | Status: DC
  Administered 2022-09-17 – 2022-09-23 (×7): 50 mg via ORAL
  Filled 2022-09-17 (×7): qty 1

## 2022-09-17 MED ORDER — HYDROXYCHLOROQUINE SULFATE 200 MG PO TABS
200.0000 mg | ORAL_TABLET | ORAL | Status: DC
  Administered 2022-09-17 – 2022-09-23 (×9): 200 mg via ORAL
  Filled 2022-09-17 (×10): qty 1

## 2022-09-17 MED ORDER — FUROSEMIDE 10 MG/ML IJ SOLN
60.0000 mg | Freq: Once | INTRAMUSCULAR | Status: AC
  Administered 2022-09-17: 60 mg via INTRAVENOUS
  Filled 2022-09-17: qty 8

## 2022-09-17 MED ORDER — ALLOPURINOL 300 MG PO TABS
300.0000 mg | ORAL_TABLET | Freq: Every day | ORAL | Status: DC
  Administered 2022-09-17 – 2022-09-23 (×7): 300 mg via ORAL
  Filled 2022-09-17 (×7): qty 1

## 2022-09-17 MED ORDER — APIXABAN 5 MG PO TABS
5.0000 mg | ORAL_TABLET | Freq: Two times a day (BID) | ORAL | Status: DC
  Administered 2022-09-17 – 2022-09-23 (×13): 5 mg via ORAL
  Filled 2022-09-17 (×13): qty 1

## 2022-09-17 MED ORDER — SODIUM CHLORIDE 0.9% FLUSH: 3.0000 mL | INTRAVENOUS | Status: DC | PRN

## 2022-09-17 MED ORDER — HYDROXYCHLOROQUINE SULFATE 200 MG PO TABS
200.0000 mg | ORAL_TABLET | ORAL | Status: DC
  Administered 2022-09-19 – 2022-09-20 (×2): 200 mg via ORAL
  Filled 2022-09-17 (×2): qty 1

## 2022-09-17 MED ORDER — ALPRAZOLAM 0.25 MG PO TABS: 0.2500 mg | ORAL_TABLET | Freq: Four times a day (QID) | ORAL | Status: DC | PRN

## 2022-09-17 MED ORDER — ONDANSETRON HCL 4 MG PO TABS: 4.0000 mg | ORAL_TABLET | Freq: Four times a day (QID) | ORAL | Status: DC | PRN

## 2022-09-17 MED ORDER — ONDANSETRON HCL 4 MG/2ML IJ SOLN: 4.0000 mg | Freq: Four times a day (QID) | INTRAMUSCULAR | Status: DC | PRN

## 2022-09-17 MED ORDER — SODIUM CHLORIDE 0.9 % IV SOLN: 250.0000 mL | INTRAVENOUS | Status: DC | PRN

## 2022-09-17 MED ORDER — POTASSIUM CHLORIDE CRYS ER 20 MEQ PO TBCR
40.0000 meq | EXTENDED_RELEASE_TABLET | ORAL | Status: AC
  Administered 2022-09-17 (×2): 40 meq via ORAL
  Filled 2022-09-17 (×2): qty 2

## 2022-09-17 MED ORDER — CEFAZOLIN SODIUM-DEXTROSE 2-4 GM/100ML-% IV SOLN
2.0000 g | Freq: Two times a day (BID) | INTRAVENOUS | Status: AC
  Administered 2022-09-17 (×2): 2 g via INTRAVENOUS
  Filled 2022-09-17 (×2): qty 100

## 2022-09-17 MED ORDER — PANTOPRAZOLE SODIUM 40 MG PO TBEC
40.0000 mg | DELAYED_RELEASE_TABLET | Freq: Every day | ORAL | Status: DC
  Administered 2022-09-17 – 2022-09-23 (×7): 40 mg via ORAL
  Filled 2022-09-17 (×7): qty 1

## 2022-09-17 MED ORDER — ALPRAZOLAM 0.25 MG PO TABS
0.2500 mg | ORAL_TABLET | Freq: Two times a day (BID) | ORAL | Status: DC | PRN
  Administered 2022-09-17: 0.25 mg via ORAL
  Filled 2022-09-17: qty 1

## 2022-09-17 MED ORDER — ENSURE ENLIVE PO LIQD
237.0000 mL | Freq: Three times a day (TID) | ORAL | Status: DC
  Administered 2022-09-17 – 2022-09-22 (×9): 237 mL via ORAL

## 2022-09-17 MED ORDER — VITAMIN B-12 1000 MCG PO TABS
1000.0000 ug | ORAL_TABLET | Freq: Every day | ORAL | Status: DC
  Administered 2022-09-17 – 2022-09-23 (×7): 1000 ug via ORAL
  Filled 2022-09-17 (×4): qty 1
  Filled 2022-09-17: qty 2
  Filled 2022-09-17 (×2): qty 1

## 2022-09-17 MED ORDER — IOHEXOL 350 MG/ML SOLN
75.0000 mL | Freq: Once | INTRAVENOUS | Status: AC | PRN
  Administered 2022-09-17: 40 mL via INTRAVENOUS

## 2022-09-17 MED ORDER — POLYETHYLENE GLYCOL 3350 17 G PO PACK
17.0000 g | PACK | Freq: Every day | ORAL | Status: DC
  Administered 2022-09-18 – 2022-09-20 (×2): 17 g via ORAL
  Filled 2022-09-17 (×5): qty 1

## 2022-09-17 MED ORDER — IPRATROPIUM-ALBUTEROL 0.5-2.5 (3) MG/3ML IN SOLN
3.0000 mL | Freq: Four times a day (QID) | RESPIRATORY_TRACT | Status: DC | PRN
  Administered 2022-09-17 – 2022-09-18 (×3): 3 mL via RESPIRATORY_TRACT
  Filled 2022-09-17 (×3): qty 3

## 2022-09-17 MED ORDER — POTASSIUM CHLORIDE CRYS ER 20 MEQ PO TBCR: 40.0000 meq | EXTENDED_RELEASE_TABLET | Freq: Once | ORAL | Status: DC

## 2022-09-17 MED ORDER — METOPROLOL TARTRATE 25 MG PO TABS
25.0000 mg | ORAL_TABLET | Freq: Two times a day (BID) | ORAL | Status: DC
  Administered 2022-09-17 (×2): 25 mg via ORAL
  Filled 2022-09-17 (×3): qty 1

## 2022-09-17 MED ORDER — FUROSEMIDE 10 MG/ML IJ SOLN
60.0000 mg | Freq: Two times a day (BID) | INTRAMUSCULAR | Status: DC
  Administered 2022-09-17 – 2022-09-18 (×2): 60 mg via INTRAVENOUS
  Filled 2022-09-17 (×2): qty 8

## 2022-09-17 MED ORDER — SODIUM CHLORIDE 0.9% FLUSH
3.0000 mL | Freq: Two times a day (BID) | INTRAVENOUS | Status: DC
  Administered 2022-09-18 – 2022-09-23 (×10): 3 mL via INTRAVENOUS

## 2022-09-17 MED ORDER — POTASSIUM CHLORIDE CRYS ER 20 MEQ PO TBCR
40.0000 meq | EXTENDED_RELEASE_TABLET | Freq: Once | ORAL | Status: AC
  Administered 2022-09-17: 40 meq via ORAL
  Filled 2022-09-17: qty 2

## 2022-09-17 MED ORDER — MAGNESIUM SULFATE IN D5W 1-5 GM/100ML-% IV SOLN: 1.0000 g | Freq: Once | INTRAVENOUS | Status: DC

## 2022-09-17 MED ORDER — POTASSIUM 99 MG PO TABS: 1.0000 | ORAL_TABLET | Freq: Every day | ORAL | Status: DC

## 2022-09-17 MED ORDER — CEFAZOLIN IV (FOR PTA / DISCHARGE USE ONLY): 2.0000 g | Freq: Two times a day (BID) | INTRAVENOUS | Status: DC

## 2022-09-17 MED ORDER — LEVOTHYROXINE SODIUM 137 MCG PO TABS
137.0000 ug | ORAL_TABLET | Freq: Every day | ORAL | Status: DC
  Administered 2022-09-18 – 2022-09-23 (×6): 137 ug via ORAL
  Filled 2022-09-17 (×6): qty 1

## 2022-09-17 MED ORDER — MAGNESIUM SULFATE 4 GM/100ML IV SOLN
4.0000 g | Freq: Once | INTRAVENOUS | Status: AC
  Administered 2022-09-17: 4 g via INTRAVENOUS
  Filled 2022-09-17: qty 100

## 2022-09-17 MED ORDER — ADULT MULTIVITAMIN W/MINERALS CH
1.0000 | ORAL_TABLET | Freq: Every day | ORAL | Status: DC
  Administered 2022-09-17 – 2022-09-23 (×7): 1 via ORAL
  Filled 2022-09-17 (×7): qty 1

## 2022-09-17 MED ORDER — CHLORHEXIDINE GLUCONATE CLOTH 2 % EX PADS
6.0000 | MEDICATED_PAD | Freq: Every day | CUTANEOUS | Status: DC
  Administered 2022-09-17 – 2022-09-23 (×7): 6 via TOPICAL

## 2022-09-17 NOTE — Assessment & Plan Note (Addendum)
Hemoglobin 8 today which appears to be near baseline Follow

## 2022-09-17 NOTE — Assessment & Plan Note (Signed)
Baseline creatinine 1-1.7 Creatinine 0.9 today Follow

## 2022-09-17 NOTE — Assessment & Plan Note (Signed)
Troponins 20s to 30s in the setting of volume overload Suspect secondary heart strain EKG grossly stable Follow

## 2022-09-17 NOTE — Consult Note (Signed)
[facility] CLINIC CARDIOLOGY CONSULT NOTE       Patient ID: MELLINDA Gillespie MRN: [id number] DOB/AGE: [birth year] 82 y.o.  Admit date: 09/17/2022 Referring Physician Dr. Doree Albee  Primary Physician Dr. Marcello Fennel Primary Cardiologist Dr. Darrold Junker Reason for Consultation CHF  HPI: Sydney Devon "[name]" is an 82yoF with a PMH of multiple myeloma (dx 2012 s/p stem cell transplant, relapse 2021, ongoing tx), HFpEF, moderate MR, recent DVT (currently taking Eliquis) CKD 4, RA, palpitations who presented to [facility] ED from a rehab facility on 09/17/2022 in respiratory distress.  Cardiology is consulted for further assistance.  The patient is well-known to our service from a previous hospitalization in December 2023 where she was treated for COVID-19 and acute on chronic CHF where her EF at that time was reduced at 30-35% she has been hospitalized 3 additional times since that December admission recently for kyphoplasty and subsequent discitis and MSSA bacteremia, and also most recently for an AKI.  Her atenolol and triamterene-HCTZ were both discontinued at discharge on 4/29 presumably due to her renal dysfunction and low blood pressures.  She presented to [facility] ED with acute shortness of breath, requiring CPAP initially as she was hypoxic on room air.  Her granddaughter at bedside notes that the patient had significant lower extremity edema for several days prior to this admission which has dramatically improved after initial IV Lasix this morning.  She also had chest tightness and a sensation of inability to take a deep breath, but denied heart racing or palpitations, orthopnea.  At my time of evaluation this afternoon, she is sitting upright in bed with her granddaughter present, noting that her breathing feels a little bit better than it did earlier today, but notes significant improvement in her lower extremity swelling.  She currently denies chest pain but feels a little anxious and thinks she is  wheezing a little.  Her vitals are notable for an elevated blood pressure at 171/86, heart rate in the 1 teens to 120s sinus tachycardia versus atrial flutter on telemetry.  She is comfortable on supplemental oxygen saturating well.  Labs notable for profound hypomagnesemia with serum mag 1.2, and hypokalemia to 3.0.  BUN/creatinine much improved at 14/0.92 and GFR >60.  Her BNP is markedly elevated at 3282.  High-sensitivity troponin borderline elevated and flat trending at 26, 31.  H&H slightly lower than baseline at 8.0/25.7  Review of systems complete and found to be negative unless listed above     Past Medical History:  Diagnosis Date   Acute respiratory failure with hypoxia (HCC) 05/07/2022   Acute respiratory failure with hypoxia (HCC) 05/07/2022   Allergic rhinitis    Allergy    Anemia    Anemia    Barrett's esophagus    Blood dyscrasia    multiple myloma remission   Cancer (HCC)    Change in bowel habits    Compression fracture 2013   Degenerative disc disease, lumbar    Dysrhythmia    palpitations   Esophageal reflux    Family history of adverse reaction to anesthesia    nausea -mom   GERD (gastroesophageal reflux disease)    Gout    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HCC)    Heart palpitations    Hyperlipidemia    Hypothyroidism    Inflammatory polyarthropathy (HCC)    Lumbago    Lumbar radiculitis    Lumbar stenosis with neurogenic claudication    Multiple myeloma (HCC)    Multiple myeloma (HCC)  Neuropathy    Bilateral Feet   Osteopenia    Osteoporosis    Other dysphagia    Palpitations    Pneumonia 04/2022   R/T COVID   Positive PPD    Renal insufficiency    Rheumatoid arthritis (HCC)    Rheumatoid arthritis (HCC)    Seasonal allergies    Tuberculosis 2011   Vertigo     Past Surgical History:  Procedure Laterality Date   ABDOMINAL HYSTERECTOMY     APPENDECTOMY     BACK SURGERY     BREAST EXCISIONAL BIOPSY Bilateral    neg   CATARACT EXTRACTION  Bilateral    CHOLECYSTECTOMY     COLONOSCOPY WITH PROPOFOL N/A 11/12/2016   Procedure: COLONOSCOPY WITH PROPOFOL;  Surgeon: Scot Jun, MD;  Location: [facility] ENDOSCOPY;  Service: Endoscopy;  Laterality: N/A;   ESOPHAGOGASTRODUODENOSCOPY (EGD) WITH PROPOFOL N/A 11/12/2016   Procedure: ESOPHAGOGASTRODUODENOSCOPY (EGD) WITH PROPOFOL;  Surgeon: Scot Jun, MD;  Location: [facility] ENDOSCOPY;  Service: Endoscopy;  Laterality: N/A;   ESOPHAGOGASTRODUODENOSCOPY (EGD) WITH PROPOFOL N/A 07/17/2022   Procedure: ESOPHAGOGASTRODUODENOSCOPY (EGD) WITH PROPOFOL;  Surgeon: Regis Bill, MD;  Location: [facility] ENDOSCOPY;  Service: Endoscopy;  Laterality: N/A;   FOOT SURGERY     KYPHOPLASTY N/A 07/29/2022   Procedure: KYPHOPLASTY THORACIC EIGHT, THORACIC NINE, THORACIC TEN;  Surgeon: Tressie Stalker, MD;  Location: [facility] OR;  Service: Neurosurgery;  Laterality: N/A;  3C   LUMBAR LAMINECTOMY/DECOMPRESSION MICRODISCECTOMY Left 06/18/2016   Procedure: LAMINECTOMY FOR FACET/SYNOVIAL CYST LUMBAR THREE - LUMBAR FOUR LEFT;  Surgeon: Tressie Stalker, MD;  Location: [facility] OR;  Service: Neurosurgery;  Laterality: Left;  LAMINECTOMY FOR FACET/SYNOVIAL CYST LUMBAR THREE - LUMBAR FOUR LEFT    (Not in a hospital admission)  Social History   Socioeconomic History   Marital status: Widowed    Spouse name: Not on file   Number of children: Not on file   Years of education: Not on file   Highest education level: Not on file  Occupational History   Not on file  Tobacco Use   Smoking status: Never   Smokeless tobacco: Never  Vaping Use   Vaping Use: Never used  Substance and Sexual Activity   Alcohol use: No   Drug use: No   Sexual activity: Yes  Other Topics Concern   Not on file  Social History Narrative   Not on file   Social Determinants of Health   Financial Resource Strain: Not on file  Food Insecurity: No Food Insecurity (09/04/2022)   Hunger Vital Sign    Worried About Running Out of Food in the  Last Year: Never true    Ran Out of Food in the Last Year: Never true  Transportation Needs: No Transportation Needs (08/05/2022)   PRAPARE - Administrator, Civil Service (Medical): No    Lack of Transportation (Non-Medical): No  Physical Activity: Not on file  Stress: Not on file  Social Connections: Not on file  Intimate Partner Violence: Not At Risk (08/05/2022)   Humiliation, Afraid, Rape, and Kick questionnaire    Fear of Current or Ex-Partner: No    Emotionally Abused: No    Physically Abused: No    Sexually Abused: No    Family History  Problem Relation Age of Onset   Hypertension Mother    Heart attack Mother    Rheum arthritis Mother    Osteoarthritis Mother    Cancer Father    Hypertension Father    Stroke Father  Breast cancer Maternal Grandmother 40   Osteoarthritis Other    Rheum arthritis Other       Intake/Output Summary (Last 24 hours) at 09/17/2022 1740 Last data filed at 09/17/2022 1210 Gross per 24 hour  Intake 100 ml  Output --  Net 100 ml    Vitals:   09/17/22 1030 09/17/22 1358 09/17/22 1530 09/17/22 1554  BP:  (!) 171/86 (!) 153/110 (!) 166/100  Pulse:  68 (!) 115 (!) 120  Resp:  16  18  Temp: 97.9 F (36.6 C)     TempSrc: Oral     SpO2:  99% 98% 99%  Weight:      Height:        PHYSICAL EXAM General: Very pleasant elderly Caucasian female, sitting upright in bed with granddaughter present.  In no acute distress. HEENT:  Normocephalic and atraumatic. Neck:  + JVD.  Lungs: Normal respiratory effort on oxygen by nasal cannula.  Bibasilar crackles with wheezing in upper lung fields bilaterally.   Heart: Tachy but regular. Normal S1 and S2, 3/6 holosystolic murmur best heard at the apex. Abdomen: Non-distended appearing.  Msk: Normal strength and tone for age. Extremities: Warm and well perfused. No clubbing, cyanosis.  Trace-1+ bilateral lower extremity edema.  Neuro: Alert and oriented X 3. Psych:  Answers questions  appropriately.   Labs: Basic Metabolic Panel: Recent Labs    09/17/22 0534 09/17/22 0751  NA 140  --   K 3.0*  --   CL 107  --   CO2 21*  --   GLUCOSE 96  --   BUN 14  --   CREATININE 0.92  --   CALCIUM 7.7*  --   MG  --  1.2*   Liver Function Tests: Recent Labs    09/17/22 0534  AST 35  ALT <5  ALKPHOS 108  BILITOT 0.6  PROT 5.3*  ALBUMIN 2.4*   No results for input(s): "LIPASE", "AMYLASE" in the last 72 hours. CBC: Recent Labs    09/17/22 0534  WBC 10.2  NEUTROABS 8.4*  HGB 8.0*  HCT 25.7*  MCV 95.5  PLT 241   Cardiac Enzymes: Recent Labs    09/17/22 0534 09/17/22 0751  TROPONINIHS 26* 31*   BNP: Recent Labs    09/17/22 0534  BNP 3,282.1*   D-Dimer: No results for input(s): "DDIMER" in the last 72 hours. Hemoglobin A1C: No results for input(s): "HGBA1C" in the last 72 hours. Fasting Lipid Panel: No results for input(s): "CHOL", "HDL", "LDLCALC", "TRIG", "CHOLHDL", "LDLDIRECT" in the last 72 hours. Thyroid Function Tests: No results for input(s): "TSH", "T4TOTAL", "T3FREE", "THYROIDAB" in the last 72 hours.  Invalid input(s): "FREET3" Anemia Panel: No results for input(s): "VITAMINB12", "FOLATE", "FERRITIN", "TIBC", "IRON", "RETICCTPCT" in the last 72 hours.   Radiology: ECHOCARDIOGRAM LIMITED  Result Date: 09/17/2022    ECHOCARDIOGRAM LIMITED REPORT   Patient Name:   AJANEE OSWALT Date of Exam: 09/17/2022 Medical Rec #:  [id number]          Height:       65.0 in Accession #:    [id number]         Weight:       150.0 lb Date of Birth:  Jun 03, 1940           BSA:          1.750 m Patient Age:    82 years           BP:  147/90 mmHg Patient Gender: F                  HR:           105 bpm. Exam Location:  [facility] Procedure: Limited Echo and Limited Color Doppler Indications:     CHF  History:         Patient has prior history of Echocardiogram examinations, most                  recent 09/01/2022. CHF, Signs/Symptoms:Bacteremia and Syncope;                   Risk Factors:Dyslipidemia. DVT, CKD.  Sonographer:     Mikki Harbor Referring Phys:  [id number] [facility] MICHELLE Reniah Cottingham Diagnosing Phys: Marcina Millard MD IMPRESSIONS  1. Left ventricular ejection fraction, by estimation, is 35 to 40%. The left ventricle has moderately decreased function. The left ventricle has no regional wall motion abnormalities. The left ventricular internal cavity size was mildly dilated. Indeterminate diastolic filling due to E-A fusion.  2. Right ventricular systolic function is normal. The right ventricular size is normal.  3. Left atrial size was moderately dilated.  4. The mitral valve is normal in structure. Severe mitral valve regurgitation. No evidence of mitral stenosis.  5. The aortic valve is normal in structure. Aortic valve regurgitation is moderate to severe. No aortic stenosis is present.  6. The inferior vena cava is normal in size with greater than 50% respiratory variability, suggesting right atrial pressure of 3 mmHg. FINDINGS  Left Ventricle: Left ventricular ejection fraction, by estimation, is 35 to 40%. The left ventricle has moderately decreased function. The left ventricle has no regional wall motion abnormalities. The left ventricular internal cavity size was mildly dilated. There is no left ventricular hypertrophy. Indeterminate diastolic filling due to E-A fusion. Right Ventricle: The right ventricular size is normal. No increase in right ventricular wall thickness. Right ventricular systolic function is normal. Left Atrium: Left atrial size was moderately dilated. Right Atrium: Right atrial size was normal in size. Pericardium: There is no evidence of pericardial effusion. Mitral Valve: The mitral valve is normal in structure. Severe mitral valve regurgitation. No evidence of mitral valve stenosis. MV peak gradient, 14.3 mmHg. The mean mitral valve gradient is 5.0 mmHg. Tricuspid Valve: The tricuspid valve is normal in structure. Tricuspid valve  regurgitation is not demonstrated. No evidence of tricuspid stenosis. Aortic Valve: The aortic valve is normal in structure. Aortic valve regurgitation is moderate to severe. No aortic stenosis is present. Pulmonic Valve: The pulmonic valve was normal in structure. Pulmonic valve regurgitation is not visualized. No evidence of pulmonic stenosis. Aorta: The aortic root is normal in size and structure. Venous: The inferior vena cava is normal in size with greater than 50% respiratory variability, suggesting right atrial pressure of 3 mmHg. IAS/Shunts: No atrial level shunt detected by color flow Doppler. LEFT VENTRICLE PLAX 2D LVIDd:         5.00 cm LVIDs:         3.70 cm LV PW:         0.80 cm LV IVS:        0.70 cm LVOT diam:     2.00 cm LVOT Area:     3.14 cm  LV Volumes (MOD) LV vol d, MOD A2C: 99.0 ml LV vol d, MOD A4C: 80.2 ml LV vol s, MOD A2C: 52.2 ml LV vol s, MOD A4C: 55.4 ml LV SV MOD A2C:  46.8 ml LV SV MOD A4C:     80.2 ml LV SV MOD BP:      35.7 ml LEFT ATRIUM         Index LA diam:    4.60 cm 2.63 cm/m   AORTA Ao Root diam: 3.40 cm MITRAL VALVE MV Area (PHT): 9.73 cm       SHUNTS MV Peak grad:  14.3 mmHg      Systemic Diam: 2.00 cm MV Mean grad:  5.0 mmHg MV Vmax:       1.89 m/s MV Vmean:      98.4 cm/s MV Decel Time: 78 msec MR Peak grad:    136.0 mmHg MR Mean grad:    84.0 mmHg MR Vmax:         583.00 cm/s MR Vmean:        424.0 cm/s MR PISA:         1.57 cm MR PISA Eff ROA: 25 mm MR PISA Radius:  0.50 cm MV E velocity: 160.00 cm/s Marcina Millard MD Electronically signed by Marcina Millard MD Signature Date/Time: 09/17/2022/5:26:11 PM    Final    CT Angio Chest PE W and/or Wo Contrast  Result Date: 09/17/2022 CLINICAL DATA:  Respiratory distress. EXAM: CT ANGIOGRAPHY CHEST WITH CONTRAST TECHNIQUE: Multidetector CT imaging of the chest was performed using the standard protocol during bolus administration of intravenous contrast. Multiplanar CT image reconstructions and MIPs were  obtained to evaluate the vascular anatomy. RADIATION DOSE REDUCTION: This exam was performed according to the departmental dose-optimization program which includes automated exposure control, adjustment of the mA and/or kV according to patient size and/or use of iterative reconstruction technique. CONTRAST:  40mL OMNIPAQUE IOHEXOL 350 MG/ML SOLN COMPARISON:  Chest radiograph 1 day prior.  CTA chest 08/05/2022. FINDINGS: Cardiovascular: There is adequate opacification of the pulmonary arteries to the segmental level. There is no evidence of pulmonary embolism. The main pulmonary artery is enlarged measuring up to 3.3 cm. There is mild calcified plaque in the nonaneurysmal thoracic aorta. The heart size is stable. There is a small pericardial effusion, slightly increased in size since the prior CT. A right upper extremity PICC is in place. The tip is obscured by contrast. Mediastinum/Nodes: The thyroid is unremarkable. The esophagus is grossly unremarkable. There is no mediastinal, hilar, or axillary lymphadenopathy. Lungs/Pleura: The trachea and central airways are patent. There are moderate-sized bilateral pleural effusions, right larger than left, with adjacent atelectasis in the lower lobes. There is additional probable atelectasis in the lingula. There is no convincing interlobular septal thickening. There is no pneumothorax. Upper Abdomen: Cholecystectomy clips are noted. The imaged portions of the upper abdominal viscera are otherwise unremarkable. Musculoskeletal: There is no acute osseous abnormality or suspicious osseous lesion. The patient is status post vertebral augmentation at T8 through T10 with near-complete collapse of the T9 vertebral body. The appearance is unchanged. Mild compression deformity of the T12 vertebral body is unchanged. Review of the MIP images confirms the above findings. IMPRESSION: 1. No evidence of pulmonary embolism. 2. Moderate-sized bilateral pleural effusions, right larger than  left, with adjacent atelectasis in the lower lobes. 3. Small pericardial effusion, slightly increased in size since the prior CT. 4. Enlarged main pulmonary artery suggesting pulmonary hypertension. Electronically Signed   By: Lesia Hausen M.D.   On: 09/17/2022 08:45   DG Chest Portable 1 View  Result Date: 09/17/2022 CLINICAL DATA:  Respiratory distress. EXAM: PORTABLE CHEST 1 VIEW COMPARISON:  09/02/2022 FINDINGS: Diffuse interstitial and  hazy airspace opacity. Cardiomegaly. Small pleural effusions are likely. Right PICC with tip at the upper SVC. No pneumothorax. IMPRESSION: 1. Diffuse pulmonary opacity which could be edema or infection. 2. Low volume chest with chronic cardiomegaly. Electronically Signed   By: Tiburcio Pea M.D.   On: 09/17/2022 06:01   MR BRAIN WO CONTRAST  Result Date: 09/03/2022 CLINICAL DATA:  Initial evaluation for mental status change, unknown cause. EXAM: MRI HEAD WITHOUT CONTRAST TECHNIQUE: Multiplanar, multiecho pulse sequences of the brain and surrounding structures were obtained without intravenous contrast. COMPARISON:  Prior CT from 09/01/2022. FINDINGS: Brain: Cerebral volume within normal limits for age. Patchy T2/FLAIR hyperintensity involving the periventricular deep white matter both cerebral hemispheres, consistent with chronic small vessel ischemic disease, mild for age. No evidence for acute or subacute infarct. Gray-white matter differentiation maintained. No areas of chronic cortical infarction. No acute or chronic intracranial blood products. No mass lesion, midline shift or mass effect. No hydrocephalus or extra-axial fluid collection. Pituitary gland and suprasellar region within normal limits. Vascular: Major intracranial vascular flow voids are maintained. Mild torcular inversion noted. Skull and upper cervical spine: Craniocervical junction within normal limits. Bone marrow signal intensity normal. No scalp soft tissue abnormality. Sinuses/Orbits: Prior  bilateral ocular lens replacement. Paranasal sinuses are largely clear. No significant mastoid effusion. Other: None. IMPRESSION: 1. No acute intracranial abnormality. 2. Mild chronic microvascular ischemic disease for age. Electronically Signed   By: Rise Mu M.D.   On: 09/03/2022 01:52   ECHOCARDIOGRAM LIMITED  Result Date: 09/02/2022    ECHOCARDIOGRAM LIMITED REPORT   Patient Name:   SAILOR KALLENBERGER Date of Exam: 09/01/2022 Medical Rec #:  [id number]          Height:       65.0 in Accession #:    [id number]         Weight:       121.3 lb Date of Birth:  1940/11/06           BSA:          1.599 m Patient Age:    82 years           BP:           97/48 mmHg Patient Gender: F                  HR:           64 bpm. Exam Location:  [facility] Procedure: 2D Echo, Cardiac Doppler, Limited Echo and Limited Color Doppler Indications:     R55 Syncope  History:         Patient has prior history of Echocardiogram examinations, most                  recent 08/09/2022. Arrythmias:Palpitations; Risk                  Factors:Dyslipidemia.  Sonographer:     Daphine Deutscher RDCS Referring Phys:  [id number] Verdene Lennert Diagnosing Phys: Alwyn Pea MD IMPRESSIONS  1. Left ventricular ejection fraction, by estimation, is 50 to 55%. The left ventricle has low normal function. The left ventricle demonstrates regional wall motion abnormalities (see scoring diagram/findings for description). Left ventricular diastolic  parameters were normal.  2. Right ventricular systolic function is normal. The right ventricular size is normal.  3. Left atrial size was mildly dilated.  4. A small pericardial effusion is present.  5. The mitral valve is normal in structure. Trivial mitral valve regurgitation.  6. The aortic valve is calcified. Aortic valve regurgitation is mild to moderate. Aortic valve sclerosis/calcification is present, without any evidence of aortic stenosis. FINDINGS  Left Ventricle: Left ventricular ejection  fraction, by estimation, is 50 to 55%. The left ventricle has low normal function. The left ventricle demonstrates regional wall motion abnormalities. The left ventricular internal cavity size was normal in size. There is no left ventricular hypertrophy. Left ventricular diastolic parameters were normal. Right Ventricle: The right ventricular size is normal. No increase in right ventricular wall thickness. Right ventricular systolic function is normal. Left Atrium: Left atrial size was mildly dilated. Right Atrium: Right atrial size was not assessed. Pericardium: A small pericardial effusion is present. Mitral Valve: The mitral valve is normal in structure. Trivial mitral valve regurgitation. Tricuspid Valve: The tricuspid valve is normal in structure. Tricuspid valve regurgitation is mild. Aortic Valve: The aortic valve is calcified. Aortic valve regurgitation is mild to moderate. Aortic regurgitation PHT measures 572 msec. Aortic valve sclerosis/calcification is present, without any evidence of aortic stenosis. Pulmonic Valve: The pulmonic valve was normal in structure. Pulmonic valve regurgitation is not visualized. Aorta: The ascending aorta was not well visualized. IAS/Shunts: The interatrial septum was not well visualized. Additional Comments: There is no pleural effusion.  LEFT VENTRICLE PLAX 2D LVIDd:         4.10 cm Diastology LVIDs:         3.00 cm LV e' medial:    6.14 cm/s LV PW:         1.00 cm LV E/e' medial:  10.7 LV IVS:        1.00 cm LV e' lateral:   8.22 cm/s                        LV E/e' lateral: 8.0  RIGHT VENTRICLE             IVC RV S prime:     13.40 cm/s  IVC diam: 1.10 cm TAPSE (M-mode): 1.8 cm LEFT ATRIUM         Index LA diam:    4.60 cm 2.88 cm/m  AORTIC VALVE AI PHT:      572 msec  AORTA Ao Root diam: 3.40 cm MITRAL VALVE               TRICUSPID VALVE MV Area (PHT): 2.95 cm    TR Peak grad:   14.7 mmHg MV Decel Time: 257 msec    TR Vmax:        192.00 cm/s MV E velocity: 65.50 cm/s  MV A velocity: 90.70 cm/s MV E/A ratio:  0.72 Dwayne D Callwood MD Electronically signed by Alwyn Pea MD Signature Date/Time: 09/02/2022/7:27:49 AM    Final    DG Chest Port 1 View  Result Date: 09/02/2022 CLINICAL DATA:  PICC placement EXAM: PORTABLE CHEST 1 VIEW COMPARISON:  Yesterday FINDINGS: Right upper extremity PICC with tip at the SVC. Cardiomegaly. Streaky opacity in the bilateral lungs. No visible effusion or pneumothorax. Chronic cardiomegaly. Multilevel cement augmentation in the thoracic spine. Gas distended upper abdominal colon. IMPRESSION: 1. Right upper extremity PICC with tip at the SVC. 2. Cardiomegaly and borderline vascular congestion. Atelectatic type opacity in the bilateral lungs. Electronically Signed   By: Tiburcio Pea M.D.   On: 09/02/2022 04:34   US RENAL  Result Date: 09/01/2022 CLINICAL DATA:  [id number] AKI (acute kidney injury) [id number] EXAM: RENAL / URINARY TRACT ULTRASOUND COMPLETE COMPARISON:  11/04/2017  FINDINGS: Right Kidney: Renal measurements: 9.6 x 4.5 x 4.6 cm = volume: 102 mL. Parenchyma echogenic compared to adjacent liver. No focal lesion or hydronephrosis. Left Kidney: Renal measurements: 8.2 x 4.2 x 3.7 cm = volume: 67 mL. No hydronephrosis. Exophytic 3.1 x 2.7 x 2.9 cm cyst from lower pole as before. Bladder: Incompletely distended.  Bilateral ureteral jets demonstrated. Other: Bilateral pleural effusions incidentally noted. IMPRESSION: 1. No hydronephrosis. 2. Echogenic renal parenchyma suggesting medical renal disease. 3. 3.1 cm left renal cyst. 4. Bilateral pleural effusions. Electronically Signed   By: Corlis Leak M.D.   On: 09/01/2022 18:35   DG Chest Portable 1 View  Result Date: 09/01/2022 CLINICAL DATA:  Recent pneumonia EXAM: PORTABLE CHEST 1 VIEW COMPARISON:  08/07/2022 FINDINGS: Previously noted bibasilar and right lung opacities are improved, with some residual opacities in the mid to lower right lung and lower left lung, which are somewhat  linear and may reflect atelectasis or infection. Unchanged cardiac and mediastinal contours. Aortic atherosclerosis. No pleural effusion or pneumothorax. No acute osseous abnormality. Prior ACDF. IMPRESSION: Previously noted bibasilar and right lung opacities are improved, with some residual opacities in the mid to lower right lung and lower left lung, which may reflect atelectasis and/or infection. Electronically Signed   By: Wiliam Ke M.D.   On: 09/01/2022 15:26   CT HEAD WO CONTRAST ( )  Result Date: 09/01/2022 CLINICAL DATA:  Trauma EXAM: CT HEAD WITHOUT CONTRAST TECHNIQUE: Contiguous axial images were obtained from the base of the skull through the vertex without intravenous contrast. RADIATION DOSE REDUCTION: This exam was performed according to the departmental dose-optimization program which includes automated exposure control, adjustment of the mA and/or kV according to patient size and/or use of iterative reconstruction technique. COMPARISON:  None Available. FINDINGS: Brain: No evidence of acute infarction, hemorrhage, hydrocephalus, extra-axial collection or mass lesion/mass effect. Sequela of moderate chronic microvascular ischemic change. Vascular: No hyperdense vessel or unexpected calcification. Skull: Normal. Negative for fracture or focal lesion. Sinuses/Orbits: No mastoid or middle ear effusion. Paranasal sinuses are notable for frothy secretions in the bilateral sphenoid sinuses, which can be seen in the setting sinusitis. Bilateral lens replacement. Orbits are otherwise unremarkable. Other: None. IMPRESSION: 1. No acute intracranial abnormality. 2. Frothy secretions in the bilateral sphenoid sinuses can be seen in the setting of acute sinusitis. Electronically Signed   By: Lorenza Cambridge M.D.   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)   Hypothyroidism   Rheumatoid arthritis (HCC)   CKD (chronic kidney disease), stage IV (HCC)   Elevated troponin   Acute deep vein thrombosis (DVT) of calf muscle vein of right lower extremity (HCC)   MSSA bacteremia   Acute respiratory failure with hypoxia (HCC)   Acute on chronic diastolic CHF (congestive heart failure) (HCC)   Obstructive lung disease (generalized) (HCC)   Hypokalemia    ASSESSMENT AND PLAN:  Sydney Devon "[name]" is an 82yoF with a PMH of multiple myeloma (dx 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  Cardiology is consulted for further assistance.  # Acute on chronic HFrEF # Moderate-severe mitral regurgitation BNP markedly elevated at 3200 with pleural effusions bilaterally on chest x-ray and CTA chest.  Clinically hypervolemic on exam with crackles, JVD, with significant peripheral edema, clinical improvement after initial IV diuresis.  Suspect some decompensation in part from stopping her diuretic within the past 10 days and some continued reduction in her EF with multiple severe illnesses requiring hospitalization over the past couple months.  Her MR is moderate to severe by echo this admission. -Continue diuresis with IV Lasix 60 mg twice daily for now -Monitor and replenish electrolytes for goal K >4, mag >2 -Start metoprolol tartrate 25 mg twice a day rather than her previous home medication atenolol -Further GDMT as BP and renal function tolerate with ARB/ARNI, MRA, SGLT2i -Will need close clinic follow-up  outpatient for further surveillance of her MR  # Sinus tachycardia versus atrial flutter In the setting of respiratory distress and hypoxia, also possible rebound tachycardia after stopping atenolol abruptly after her last hospital discharge.  Will start metoprolol as above, and continue Eliquis 5 mg twice daily as treatment of her recent DVT as below.  Will repeat an EKG tomorrow a.m., and continue telemetry monitoring. -Monitor and replenish electrolytes as above  # Recent MSSA bacteremia PICC line present, antibiotics per primary/infectious disease  # History of DVT Eliquis 5 mg twice daily as above  # Demand ischemia Borderline elevated and flat trending, in the absence of ischemic EKG changes and in the setting of acute on chronic CHF, this is most consistent with demand/supply mismatch and not ACS   This patient's plan of care was discussed and created with Dr. Darrold Junker and he is in agreement.  Signed: Rebeca Allegra , PA-C 09/17/2022, 5:40 PM [facility]

## 2022-09-17 NOTE — Progress Notes (Signed)
Patient c/o SOB. This RN assessed O2 sat and she is 100% on 2L. Supplemental O2 turned to 3L, pt states she is more comfortable now. RT also called by this RN for neb treatment.

## 2022-09-17 NOTE — Progress Notes (Signed)
*  PRELIMINARY RESULTS* Echocardiogram A Limited 2D Echocardiogram has been performed.  [name] 09/17/2022, 1:28 PM

## 2022-09-17 NOTE — Assessment & Plan Note (Addendum)
Acute respiratory failure with volume overload with noted BNP of 3300 And bilateral pleural effusions on imaging 2D echo April 2024 with EF of 50 to 55% Symptomatically improving status post IV diuresis Reach out to cardiology for consultation Monitor

## 2022-09-17 NOTE — ED Notes (Signed)
Patient repositioned in the bed at this time. NAD noted. Resting comfortably on bi-pap. Family member at bedside.

## 2022-09-17 NOTE — ED Provider Notes (Signed)
[facility] Provider Note    Event Date/Time   First MD Initiated Contact with Patient 09/17/22 [phone]     (approximate)   History   Respiratory Distress   HPI  Sydney Gillespie is a 82 y.o. female who presents to the ED for evaluation of Respiratory Distress   I reviewed medical DC summary from 4/29.  AKI on CKD and Ancef continues via PICC line from previous admission.  Syncope, AKI, PRBC transfusion Previous admission late March/early April for MSSA bacteremia and discitis after thoracic kyphoplasty.  She is anticoagulated on Eliquis due to history of DVT.  Patient presents to the ED via EMS from her SNF for evaluation of respiratory distress. She reports feeling fine when she went to bed but awoke overnight with sudden dyspnea.  EMS found her hypoxic to the 60s on room air improved with NRB and subsequent CPAP with paramedics.  On arrival to the ED on CPAP patient reports feeling much better.  No chest pain  Physical Exam   Triage Vital Signs: ED Triage Vitals  Enc Vitals Group     BP      Pulse      Resp      Temp      Temp src      SpO2      Weight      Height      Head Circumference      Peak Flow      Pain Score      Pain Loc      Pain Edu?      Excl. in GC?     Most recent vital signs: Vitals:   09/17/22 0527 09/17/22 0600  BP: (!) 161/93   Pulse: (!) 133   Resp: (!) 22   Temp:  98 F (36.7 C)  SpO2: 100%     General: Awake, no distress.  CV:  Good peripheral perfusion.  Tachycardic and regular Resp:  Mild tachypnea to the mid 20s.  On BiPAP.  Clear lungs. Abd:  No distention.  Soft MSK:  No deformity noted.  Neuro:  No focal deficits appreciated. Other:     ED Results / Procedures / Treatments   Labs (all labs ordered are listed, but only abnormal results are displayed) Labs Reviewed  COMPREHENSIVE METABOLIC PANEL - Abnormal; Notable for the following components:      Result Value   Potassium 3.0 (*)    CO2  21 (*)    Calcium 7.7 (*)    Total Protein 5.3 (*)    Albumin 2.4 (*)    All other components within normal limits  CBC WITH DIFFERENTIAL/PLATELET - Abnormal; Notable for the following components:   RBC 2.69 (*)    Hemoglobin 8.0 (*)    HCT 25.7 (*)    RDW 19.2 (*)    Neutro Abs 8.4 (*)    Abs Immature Granulocytes 0.09 (*)    All other components within normal limits  TROPONIN I (HIGH SENSITIVITY) - Abnormal; Notable for the following components:   Troponin I (High Sensitivity) 26 (*)    All other components within normal limits  SARS CORONAVIRUS 2 BY RT PCR  CULTURE, BLOOD (ROUTINE X 2)  CULTURE, BLOOD (ROUTINE X 2)  LACTIC ACID, PLASMA  BRAIN NATRIURETIC PEPTIDE  PROCALCITONIN  LACTIC ACID, PLASMA    EKG Sinus tachycardia with a rate of 115 bpm, leftward axis and QTc prolonged at 547.  No STEMI.  RADIOLOGY 1  view CXR interpreted by me with diffuse interstitial infiltrates concerning for pulmonary vascular congestion  Official radiology report(s): DG Chest Portable 1 View  Result Date: 09/17/2022 CLINICAL DATA:  Respiratory distress. EXAM: PORTABLE CHEST 1 VIEW COMPARISON:  09/02/2022 FINDINGS: Diffuse interstitial and hazy airspace opacity. Cardiomegaly. Small pleural effusions are likely. Right PICC with tip at the upper SVC. No pneumothorax. IMPRESSION: 1. Diffuse pulmonary opacity which could be edema or infection. 2. Low volume chest with chronic cardiomegaly. Electronically Signed   By: Tiburcio Pea M.D.   On: 09/17/2022 06:01    PROCEDURES and INTERVENTIONS:  .1-3 Lead EKG Interpretation  Performed by: Delton Prairie, MD Authorized by: Delton Prairie, MD     Interpretation: abnormal     ECG rate:  120   ECG rate assessment: tachycardic     Rhythm: sinus tachycardia     Ectopy: none     Conduction: normal   .Critical Care  Performed by: Delton Prairie, MD Authorized by: Delton Prairie, MD   Critical care provider statement:    Critical care time (minutes):  30    Critical care time was exclusive of:  Separately billable procedures and treating other patients   Critical care was necessary to treat or prevent imminent or life-threatening deterioration of the following conditions:  Respiratory failure   Critical care was time spent personally by me on the following activities:  Development of treatment plan with patient or surrogate, discussions with consultants, evaluation of patient's response to treatment, examination of patient, ordering and review of laboratory studies, ordering and review of radiographic studies, ordering and performing treatments and interventions, pulse oximetry, re-evaluation of patient's condition and review of old charts   Medications - No data to display   IMPRESSION / MDM / ASSESSMENT AND PLAN / ED COURSE  I reviewed the triage vital signs and the nursing notes.  Differential diagnosis includes, but is not limited to, ACS, PTX, PNA, muscle strain/spasm, PE, dissection, anxiety, pleural effusion, flash pulmonary edema  {Patient presents with symptoms of an acute illness or injury that is potentially life-threatening.  82 year old woman presents to the ED in respiratory distress with evidence of acute hypoxic respiratory failure requiring BiPAP.  She is tachycardic and appears to be in sinus tachycardia and remains hemodynamically stable.  Blood work returning with a normal WBC and lactic acid.  The clinical picture is most concerning for flash pulmonary edema.  While cultures are drawn I have no clarification to broaden her antibiotics out from the Ancef via PICC line that she is already on.  Metabolic panel with normal renal function and hypokalemia.  First troponin is moderately elevated at likely secondary to her respiratory status but we will continue to trend these.  Due to the sudden nature of her symptom onset we will obtain a CTA chest.  I expect she will require medical admission.  Clinical Course as of 09/17/22 0642  Thu  Sep 17, 2022  1610 reassessed [DS]  0617 Reassessed.  Daughter at the bedside.  We discussed possible etiologies of her symptoms and plan of care [DS]    Clinical Course User Index [DS] Delton Prairie, MD     FINAL CLINICAL IMPRESSION(S) / ED DIAGNOSES   Final diagnoses:  Acute respiratory failure with hypoxia (HCC)     Rx / DC Orders   ED Discharge Orders     None        Note:  This document was prepared using Dragon voice recognition software and may include unintentional  dictation errors.   Delton Prairie, MD 09/17/22 [phone]

## 2022-09-17 NOTE — Assessment & Plan Note (Signed)
-   Continue Eliquis 

## 2022-09-17 NOTE — Assessment & Plan Note (Signed)
Baseline asthma/obstructive lung disease Decompensated respiratory status in the setting of volume overload No active wheezing at present Will continue home inhalers Reassess if patient's respiratory status continues to decompensate

## 2022-09-17 NOTE — Assessment & Plan Note (Signed)
Continue Synthroid °

## 2022-09-17 NOTE — H&P (Signed)
History and Physical    Patient: Sydney Gillespie DOB: February 27, 1941 DOA: 09/17/2022 DOS: the patient was seen and examined on 09/17/2022 PCP: Barbette Reichmann, MD  Patient coming from:  Rehab   Chief Complaint:  Chief Complaint  Patient presents with   Respiratory Distress   HPI: Sydney Gillespie is a 82 y.o. female with medical history significant of multiple medical issues including recent MSSA bacteremia on cefazolin, multiple myeloma, stage III CKD, obstructive lung disease, hypertension, hyperlipidemia, rheumatoid arthritis, history of DVT on apixaban presenting with acute respiratory failure with hypoxia, volume overload.  Patient noted to have been recently admitted April 23 through April 29 for acute on chronic kidney disease.  Received IV hydration and discharge.  Per the granddaughter, patient has had worsening lower extremity swelling since discharge.  Has been taking outpatient diuretics still with lower extremity swelling.  Compression hose were not placed at facility per report.  Compression hose were then placed recently with the past 1 to 2 days with patient developing significant shortness of breath.  No fevers or chills.  No nausea or vomiting.  No chest pain.  No abdominal pain.  Developed significant shortness of breath over the past 12 to 24 hours.  No reported NSAID use or high salt intake. Presented to the ER afebrile, heart rate in the 120s, initially requiring BiPAP.  Given 60 mg IV Lasix with patient being transition to 45 L nasal cannula.  White count 10, hemoglobin 8, troponin 26-31, COVID negative, creatinine 0.92.  Potassium 3.0, BNP 3282.  EKG sinus tach.  CTA of the chest negative for PE but does show moderate-sized bilateral pleural effusions right greater than left, small pericardial effusion as well as findings concerning for pulmonary hypertension. Review of Systems: As mentioned in the history of present illness. All other systems reviewed and are  negative. Past Medical History:  Diagnosis Date   Acute respiratory failure with hypoxia (HCC) 05/07/2022   Acute respiratory failure with hypoxia (HCC) 05/07/2022   Allergic rhinitis    Allergy    Anemia    Anemia    Barrett's esophagus    Blood dyscrasia    multiple myloma remission   Cancer (HCC)    Change in bowel habits    Compression fracture 2013   Degenerative disc disease, lumbar    Dysrhythmia    palpitations   Esophageal reflux    Family history of adverse reaction to anesthesia    nausea -mom   GERD (gastroesophageal reflux disease)    Gout    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HCC)    Heart palpitations    Hyperlipidemia    Hypothyroidism    Inflammatory polyarthropathy (HCC)    Lumbago    Lumbar radiculitis    Lumbar stenosis with neurogenic claudication    Multiple myeloma (HCC)    Multiple myeloma (HCC)    Neuropathy    Bilateral Feet   Osteopenia    Osteoporosis    Other dysphagia    Palpitations    Pneumonia 04/2022   R/T COVID   Positive PPD    Renal insufficiency    Rheumatoid arthritis (HCC)    Rheumatoid arthritis (HCC)    Seasonal allergies    Tuberculosis 2011   Vertigo    Past Surgical History:  Procedure Laterality Date   ABDOMINAL HYSTERECTOMY     APPENDECTOMY     BACK SURGERY     BREAST EXCISIONAL BIOPSY Bilateral    neg   CATARACT EXTRACTION Bilateral  CHOLECYSTECTOMY     COLONOSCOPY WITH PROPOFOL N/A 11/12/2016   Procedure: COLONOSCOPY WITH PROPOFOL;  Surgeon: Scot Jun, MD;  Location: [facility] ENDOSCOPY;  Service: Endoscopy;  Laterality: N/A;   ESOPHAGOGASTRODUODENOSCOPY (EGD) WITH PROPOFOL N/A 11/12/2016   Procedure: ESOPHAGOGASTRODUODENOSCOPY (EGD) WITH PROPOFOL;  Surgeon: Scot Jun, MD;  Location: [facility] ENDOSCOPY;  Service: Endoscopy;  Laterality: N/A;   ESOPHAGOGASTRODUODENOSCOPY (EGD) WITH PROPOFOL N/A 07/17/2022   Procedure: ESOPHAGOGASTRODUODENOSCOPY (EGD) WITH PROPOFOL;  Surgeon: Regis Bill, MD;   Location: [facility] ENDOSCOPY;  Service: Endoscopy;  Laterality: N/A;   FOOT SURGERY     KYPHOPLASTY N/A 07/29/2022   Procedure: KYPHOPLASTY THORACIC EIGHT, THORACIC NINE, THORACIC TEN;  Surgeon: Tressie Stalker, MD;  Location: [facility] OR;  Service: Neurosurgery;  Laterality: N/A;  3C   LUMBAR LAMINECTOMY/DECOMPRESSION MICRODISCECTOMY Left 06/18/2016   Procedure: LAMINECTOMY FOR FACET/SYNOVIAL CYST LUMBAR THREE - LUMBAR FOUR LEFT;  Surgeon: Tressie Stalker, MD;  Location: [facility] OR;  Service: Neurosurgery;  Laterality: Left;  LAMINECTOMY FOR FACET/SYNOVIAL CYST LUMBAR THREE - LUMBAR FOUR LEFT   Social History:  reports that she has never smoked. She has never used smokeless tobacco. She reports that she does not drink alcohol and does not use drugs.  Allergies  Allergen Reactions   Isoniazid Other (See Comments)    Blisters     Family History  Problem Relation Age of Onset   Hypertension Mother    Heart attack Mother    Rheum arthritis Mother    Osteoarthritis Mother    Cancer Father    Hypertension Father    Stroke Father    Breast cancer Maternal Grandmother 40   Osteoarthritis Other    Rheum arthritis Other     Prior to Admission medications   Medication Sig Start Date End Date Taking? Authorizing Provider  allopurinol (ZYLOPRIM) 300 MG tablet Take 300 mg by mouth daily. 09/14/14  Yes [provider]  ALPRAZolam (XANAX) 0.25 MG tablet Take 1 tablet (0.25 mg total) by mouth 2 (two) times daily as needed. Once daily for anxiety or sleep 09/07/22  Yes Wieting, Richard, MD  ELIQUIS 5 MG TABS tablet TAKE 1 TABLET(5 MG) BY MOUTH TWICE DAILY Patient taking differently: Take 5 mg by mouth 2 (two) times daily. 08/03/22  Yes Earna Coder, MD  lansoprazole (PREVACID) 30 MG capsule Take 1 capsule (30 mg total) by mouth 2 (two) times daily before a meal. 06/12/22  Yes Earna Coder, MD  nystatin-triamcinolone (MYCOLOG II) cream Apply 1 Application topically 2 (two) times daily as  needed (itching). 08/24/22  Yes [provider]  OLANZapine (ZYPREXA) 10 MG tablet Take 1 tablet (10 mg total) by mouth at bedtime. 09/07/22  Yes Wieting, Richard, MD  senna-docusate (SENOKOT-S) 8.6-50 MG tablet Take 1 tablet by mouth 2 (two) times daily. Hold if having loose or frequent stools. Patient taking differently: Take 1 tablet by mouth 2 (two) times daily as needed for mild constipation. Hold if having loose or frequent stools. 08/12/22  Yes Esaw Grandchild A, DO  sertraline (ZOLOFT) 50 MG tablet Take 1 tablet (50 mg total) by mouth daily. 08/17/22  Yes Earna Coder, MD  acetaminophen (TYLENOL) 325 MG tablet Take 2 tablets (650 mg total) by mouth every 6 (six) hours as needed for mild pain (or Fever >/= 101). 09/07/22   Alford Highland, MD  Ascorbic Acid (VITAMIN C) 1000 MG tablet Take 1,000 mg by mouth daily.    [provider]  CALCIUM MAGNESIUM ZINC PO Take 1  tablet by mouth 2 (two) times daily. 1 am, 2 hs    [provider]  ceFAZolin (ANCEF) IVPB Inject 2 g into the vein every 12 (twelve) hours for 13 days. Indication:  MSSA bacteremia and discitis First Dose: Yes Last Day of Therapy:  09/17/2022 Labs - Once weekly:  CBC/D, CMP, ESR and CRP Please pull PIC at completion of IV antibiotics Fax weekly lab results  promptly to [phone] Method of administration: IV Push Method of administration may be changed at the discretion of home infusion pharmacist based upon assessment of the patient and/or caregiver's ability to self-administer the medication ordered. 09/07/22 09/20/22  Alford Highland, MD  Cholecalciferol (VITAMIN D-3) 125 MCG (5000 UT) TABS Take 5,000 Units by mouth daily.    [provider]  docusate sodium (COLACE) 100 MG capsule Take 1 capsule (100 mg total) by mouth 2 (two) times daily. 07/30/22   Tressie Stalker, MD  feeding supplement (ENSURE ENLIVE / ENSURE PLUS) LIQD Take 237 mLs by mouth 3 (three) times daily between meals.  09/07/22   Alford Highland, MD  hydroxychloroquine (PLAQUENIL) 200 MG tablet Take 200 mg by mouth See admin instructions. Patient currently takes 200 mg twice daily Monday through Friday and 200 mg once daily Saturday and Sunday. 10/25/18   [provider]  Ipratropium-Albuterol (COMBIVENT) 20-100 MCG/ACT AERS respimat Inhale 1 puff into the lungs every 6 (six) hours as needed for wheezing. 05/10/22   Pennie Banter, DO  iron polysaccharides (NIFEREX) 150 MG capsule Take 1 capsule (150 mg total) by mouth daily. 08/13/22   Pennie Banter, DO  levothyroxine (SYNTHROID) 137 MCG tablet Take 1 tablet (137 mcg total) by mouth daily at 6 (six) AM. 09/08/22   Alford Highland, MD  midodrine (PROAMATINE) 5 MG tablet Take 1 tablet (5 mg total) by mouth 3 (three) times daily with meals. 09/07/22   Alford Highland, MD  Multiple Vitamin (MULTIVITAMIN WITH MINERALS) TABS tablet Take 1 tablet by mouth daily. 09/08/22   Alford Highland, MD  polyethylene glycol (MIRALAX / GLYCOLAX) 17 g packet Take 17 g by mouth daily. 09/08/22   Alford Highland, MD  Potassium 99 MG TABS Take 1 tablet by mouth daily.    [provider]  rOPINIRole (REQUIP) 0.25 MG tablet Take 1 tablet (0.25 mg total) by mouth at bedtime. 09/07/22   Alford Highland, MD  sodium chloride flush (NS) 0.9 % SOLN Inject 3 mLs into the vein every 12 (twelve) hours. 09/07/22   Alford Highland, MD  Spacer/Aero-Holding Deretha Emory DEVI 1 each by Does not apply route as needed. 05/10/22   Pennie Banter, DO  vitamin B-12 (CYANOCOBALAMIN) 1000 MCG tablet Take 1,000 mcg by mouth daily. 01/03/20   [provider]  zinc gluconate 50 MG tablet Take 50 mg by mouth daily.    [provider]    Physical Exam: Vitals:   09/17/22 0600 09/17/22 0630 09/17/22 0730 09/17/22 0800  BP:  (!) 150/82 (!) 152/86 (!) 157/89  Pulse:  (!) 117 (!) 115 (!) 110  Resp:  18 (!) 21 14  Temp: 98 F (36.7 C)     TempSrc: Oral     SpO2:  100% 100%  100%  Weight:      Height:       Physical Exam Constitutional:      Appearance: She is normal weight.  HENT:     Head: Normocephalic.     Nose: Nose normal.     Mouth/Throat:  Mouth: Mucous membranes are moist.  Eyes:     Pupils: Pupils are equal, round, and reactive to light.  Cardiovascular:     Rate and Rhythm: Normal rate and regular rhythm.  Pulmonary:     Effort: Pulmonary effort is normal.  Abdominal:     General: Bowel sounds are normal.  Musculoskeletal:        General: Normal range of motion.     Cervical back: Normal range of motion.  Skin:    General: Skin is warm.  Neurological:     General: No focal deficit present.  Psychiatric:        Mood and Affect: Mood normal.     Data Reviewed:  There are no new results to review at this time. CT Angio Chest PE W and/or Wo Contrast CLINICAL DATA:  Respiratory distress.  EXAM: CT ANGIOGRAPHY CHEST WITH CONTRAST  TECHNIQUE: Multidetector CT imaging of the chest was performed using the standard protocol during bolus administration of intravenous contrast. Multiplanar CT image reconstructions and MIPs were obtained to evaluate the vascular anatomy.  RADIATION DOSE REDUCTION: This exam was performed according to the departmental dose-optimization program which includes automated exposure control, adjustment of the mA and/or kV according to patient size and/or use of iterative reconstruction technique.  CONTRAST:  40mL OMNIPAQUE IOHEXOL 350 MG/ML SOLN  COMPARISON:  Chest radiograph 1 day prior.  CTA chest 08/05/2022.  FINDINGS: Cardiovascular: There is adequate opacification of the pulmonary arteries to the segmental level. There is no evidence of pulmonary embolism. The main pulmonary artery is enlarged measuring up to 3.3 cm. There is mild calcified plaque in the nonaneurysmal thoracic aorta. The heart size is stable. There is a small pericardial effusion, slightly increased in size since the prior CT.  A right upper extremity PICC is in place. The tip is obscured by contrast.  Mediastinum/Nodes: The thyroid is unremarkable. The esophagus is grossly unremarkable. There is no mediastinal, hilar, or axillary lymphadenopathy.  Lungs/Pleura: The trachea and central airways are patent.  There are moderate-sized bilateral pleural effusions, right larger than left, with adjacent atelectasis in the lower lobes. There is additional probable atelectasis in the lingula. There is no convincing interlobular septal thickening. There is no pneumothorax.  Upper Abdomen: Cholecystectomy clips are noted. The imaged portions of the upper abdominal viscera are otherwise unremarkable.  Musculoskeletal: There is no acute osseous abnormality or suspicious osseous lesion. The patient is status post vertebral augmentation at T8 through T10 with near-complete collapse of the T9 vertebral body. The appearance is unchanged. Mild compression deformity of the T12 vertebral body is unchanged.  Review of the MIP images confirms the above findings.  IMPRESSION: 1. No evidence of pulmonary embolism. 2. Moderate-sized bilateral pleural effusions, right larger than left, with adjacent atelectasis in the lower lobes. 3. Small pericardial effusion, slightly increased in size since the prior CT. 4. Enlarged main pulmonary artery suggesting pulmonary hypertension.  Electronically Signed   By: Lesia Hausen M.D.   On: 09/17/2022 08:45 DG Chest Portable 1 View CLINICAL DATA:  Respiratory distress.  EXAM: PORTABLE CHEST 1 VIEW  COMPARISON:  09/02/2022  FINDINGS: Diffuse interstitial and hazy airspace opacity. Cardiomegaly. Small pleural effusions are likely. Right PICC with tip at the upper SVC. No pneumothorax.  IMPRESSION: 1. Diffuse pulmonary opacity which could be edema or infection. 2. Low volume chest with chronic cardiomegaly.  Electronically Signed   By: Tiburcio Pea M.D.   On: 09/17/2022  06:01  Lab Results  Component Value Date  WBC 10.2 09/17/2022   HGB 8.0 (L) 09/17/2022   HCT 25.7 (L) 09/17/2022   MCV 95.5 09/17/2022   PLT 241 09/17/2022   Last metabolic panel Lab Results  Component Value Date   GLUCOSE 96 09/17/2022   NA 140 09/17/2022   K 3.0 (L) 09/17/2022   CL 107 09/17/2022   CO2 21 (L) 09/17/2022   BUN 14 09/17/2022   CREATININE 0.92 09/17/2022   GFRNONAA >60 09/17/2022   CALCIUM 7.7 (L) 09/17/2022   PHOS 3.0 05/10/2022   PROT 5.3 (L) 09/17/2022   ALBUMIN 2.4 (L) 09/17/2022   LABGLOB 3.4 08/17/2022   AGRATIO 1.1 12/11/2015   BILITOT 0.6 09/17/2022   ALKPHOS 108 09/17/2022   AST 35 09/17/2022   ALT <5 09/17/2022   ANIONGAP 12 09/17/2022    Assessment and Plan: Acute respiratory failure with hypoxia (HCC) Decompensated respiratory status initially requiring BiPAP-now on 2 L in the setting of flash pulmonary edema and volume overload CTA chest negative for PE, + moderate bilateral pleural effusions and small pericardial effusion, ? Pulm HTN  Clinically improved status post 60 mg IV Lasix BNP 3281 No overt evidence of infection present Will continue to monitor respiratory status with diuresis Consult cardiology   Acute on chronic diastolic CHF (congestive heart failure) (HCC) Acute respiratory failure with volume overload with noted BNP of 3300 And bilateral pleural effusions on imaging 2D echo April 2024 with EF of 50 to 55% Symptomatically improving status post IV diuresis Reach out to cardiology for consultation Monitor    Elevated troponin Troponins 20s to 30s in the setting of volume overload Suspect secondary heart strain EKG grossly stable Follow  CKD (chronic kidney disease), stage IV (HCC) Baseline creatinine 1-1.7 Creatinine 0.9 today Follow  MSSA bacteremia On IV cefazolin with today May 9 being the last day of treatment Monitor for now   Gout Allopurinol  Hypokalemia Potassium 3.0 Replete Check mag  level  Obstructive lung disease (generalized) (HCC) Baseline asthma/obstructive lung disease Decompensated respiratory status in the setting of volume overload No active wheezing at present Will continue home inhalers Reassess if patient's respiratory status continues to decompensate  Acute deep vein thrombosis (DVT) of calf muscle vein of right lower extremity (HCC) Continue Eliquis  Rheumatoid arthritis (HCC) Stable Continue Plaquenil  Hypothyroidism Continue Synthroid  Anemia due to stage 3 chronic kidney disease (HCC) Hemoglobin 8 today which appears to be near baseline Follow  HLD (hyperlipidemia) Continue statin      Advance Care Planning:   Code Status: DNR   Consults: Cardiology w/ Dr. Darrold Junker   Family Communication: granddaughter at the bedside   Severity of Illness: The appropriate patient status for this patient is OBSERVATION. Observation status is judged to be reasonable and necessary in order to provide the required intensity of service to ensure the patient's safety. The patient's presenting symptoms, physical exam findings, and initial radiographic and laboratory data in the context of their medical condition is felt to place them at decreased risk for further clinical deterioration. Furthermore, it is anticipated that the patient will be medically stable for discharge from the hospital within 2 midnights of admission.   Author: Floydene Flock, MD 09/17/2022 10:15 AM  For on call review [email or web address].

## 2022-09-17 NOTE — Assessment & Plan Note (Addendum)
Decompensated respiratory status initially requiring BiPAP-now on 2 L in the setting of flash pulmonary edema and volume overload CTA chest negative for PE, + moderate bilateral pleural effusions and small pericardial effusion, ? Pulm HTN  Clinically improved status post 60 mg IV Lasix BNP 3281 No overt evidence of infection present Will continue to monitor respiratory status with diuresis Consult cardiology

## 2022-09-17 NOTE — Assessment & Plan Note (Signed)
Allopurinol  

## 2022-09-17 NOTE — ED Notes (Signed)
RT called at this time for patient to go to CT.

## 2022-09-17 NOTE — Assessment & Plan Note (Signed)
Continue statin. 

## 2022-09-17 NOTE — Assessment & Plan Note (Signed)
Potassium 3.0 Replete Check mag level

## 2022-09-17 NOTE — Plan of Care (Addendum)
Patient A&Ox4, from home, up with assist and walker in room, pure wick maintained, IV abx given as well as patient required PRN neb treatment and 3L O2 this shift.

## 2022-09-17 NOTE — Assessment & Plan Note (Signed)
On IV cefazolin with today May 9 being the last day of treatment Monitor for now

## 2022-09-17 NOTE — Assessment & Plan Note (Signed)
Stable.  Continue Plaquenil 

## 2022-09-17 NOTE — ED Notes (Signed)
Patient to CT at this time

## 2022-09-17 NOTE — ED Triage Notes (Signed)
Woke up SOB with Spo2 in 60's on RA. Fire placed 15L NRB and improved to 92%. Pt arrives with EMS on CPAP. RT in room to place Bipap. EMS reports pt was tripoding and gasping on their arrival. Pt currently breathing unlabored. MD in room.   Duo neb given en route x2

## 2022-09-17 NOTE — ED Notes (Signed)
Patient repositioned in bed. Patient sitting up eating breakfast tray. Granddaughter at bedside.

## 2022-09-18 ENCOUNTER — Other Ambulatory Visit: Payer: Self-pay | Admitting: Internal Medicine

## 2022-09-18 ENCOUNTER — Other Ambulatory Visit (HOSPITAL_COMMUNITY): Payer: Self-pay

## 2022-09-18 ENCOUNTER — Telehealth (HOSPITAL_COMMUNITY): Payer: Self-pay | Admitting: Pharmacy Technician

## 2022-09-18 DIAGNOSIS — N183 Chronic kidney disease, stage 3 unspecified: Secondary | ICD-10-CM | POA: Diagnosis not present

## 2022-09-18 DIAGNOSIS — I5023 Acute on chronic systolic (congestive) heart failure: Secondary | ICD-10-CM | POA: Diagnosis not present

## 2022-09-18 DIAGNOSIS — D631 Anemia in chronic kidney disease: Secondary | ICD-10-CM

## 2022-09-18 DIAGNOSIS — M4626 Osteomyelitis of vertebra, lumbar region: Secondary | ICD-10-CM

## 2022-09-18 DIAGNOSIS — M4646 Discitis, unspecified, lumbar region: Secondary | ICD-10-CM

## 2022-09-18 DIAGNOSIS — J9601 Acute respiratory failure with hypoxia: Secondary | ICD-10-CM

## 2022-09-18 DIAGNOSIS — I82461 Acute embolism and thrombosis of right calf muscular vein: Secondary | ICD-10-CM

## 2022-09-18 DIAGNOSIS — E039 Hypothyroidism, unspecified: Secondary | ICD-10-CM

## 2022-09-18 DIAGNOSIS — I4581 Long QT syndrome: Secondary | ICD-10-CM

## 2022-09-18 DIAGNOSIS — N184 Chronic kidney disease, stage 4 (severe): Secondary | ICD-10-CM | POA: Diagnosis not present

## 2022-09-18 DIAGNOSIS — E876 Hypokalemia: Secondary | ICD-10-CM

## 2022-09-18 DIAGNOSIS — M069 Rheumatoid arthritis, unspecified: Secondary | ICD-10-CM

## 2022-09-18 LAB — COMPREHENSIVE METABOLIC PANEL
ALT: <5 U/L (ref 0–44)
AST: 29 U/L (ref 15–41)
Albumin: 2.6 g/dL — ABNORMAL LOW (ref 3.5–5.0)
Alkaline Phosphatase: 112 U/L (ref 38–126)
Anion gap: 8 (ref 5–15)
BUN: 19 mg/dL (ref 8–23)
CO2: 27 mmol/L (ref 22–32)
Calcium: 8 mg/dL — ABNORMAL LOW (ref 8.9–10.3)
Chloride: 107 mmol/L (ref 98–111)
Creatinine, Ser: 1.09 mg/dL — ABNORMAL HIGH (ref 0.44–1.00)
GFR, Estimated: 51 mL/min — ABNORMAL LOW (ref >60)
Glucose, Bld: 93 mg/dL (ref 70–99)
Potassium: 4.5 mmol/L (ref 3.5–5.1)
Sodium: 142 mmol/L (ref 135–145)
Total Bilirubin: 0.5 mg/dL (ref 0.3–1.2)
Total Protein: 5.7 g/dL — ABNORMAL LOW (ref 6.5–8.1)

## 2022-09-18 LAB — CBC
HCT: 25.9 % — ABNORMAL LOW (ref 36.0–46.0)
Hemoglobin: 8.3 g/dL — ABNORMAL LOW (ref 12.0–15.0)
MCH: 30.6 pg (ref 26.0–34.0)
MCHC: 32 g/dL (ref 30.0–36.0)
MCV: 95.6 fL (ref 80.0–100.0)
Platelets: 275 10*3/uL (ref 150–400)
RBC: 2.71 MIL/uL — ABNORMAL LOW (ref 3.87–5.11)
RDW: 19.2 % — ABNORMAL HIGH (ref 11.5–15.5)
WBC: 12.4 10*3/uL — ABNORMAL HIGH (ref 4.0–10.5)
nRBC: 0 % (ref 0.0–0.2)

## 2022-09-18 LAB — PHOSPHORUS: Phosphorus: 2.9 mg/dL (ref 2.5–4.6)

## 2022-09-18 LAB — MAGNESIUM: Magnesium: 1.9 mg/dL (ref 1.7–2.4)

## 2022-09-18 MED ORDER — ALBUMIN HUMAN 25 % IV SOLN
50.0000 g | Freq: Once | INTRAVENOUS | Status: AC
  Administered 2022-09-18: 50 g via INTRAVENOUS
  Filled 2022-09-18: qty 200

## 2022-09-18 MED ORDER — FUROSEMIDE 10 MG/ML IJ SOLN: 40.0000 mg | Freq: Every day | INTRAMUSCULAR | Status: DC

## 2022-09-18 MED ORDER — CEFAZOLIN SODIUM-DEXTROSE 2-4 GM/100ML-% IV SOLN
2.0000 g | Freq: Three times a day (TID) | INTRAVENOUS | Status: DC
  Administered 2022-09-18 – 2022-09-19 (×2): 2 g via INTRAVENOUS
  Filled 2022-09-18 (×2): qty 100

## 2022-09-18 MED ORDER — METOPROLOL TARTRATE 25 MG PO TABS
25.0000 mg | ORAL_TABLET | Freq: Three times a day (TID) | ORAL | Status: AC
  Administered 2022-09-18 – 2022-09-21 (×11): 25 mg via ORAL
  Filled 2022-09-18 (×11): qty 1

## 2022-09-18 NOTE — Telephone Encounter (Signed)
Pharmacy Patient Advocate Encounter  Insurance verification completed.    The patient is insured through [employer] Part D   The patient is currently admitted and ran test claims for the following: Sherryll Burger, Farxiga, Jardiance.  Copays and coinsurance results were relayed to Inpatient clinical team.

## 2022-09-18 NOTE — TOC Benefit Eligibility Note (Signed)
Patient Product/process development scientist completed.    The patient is currently admitted and upon discharge could be taking Entresto 24-26 mg.  The current 30 day co-pay is $132.43 due to a deductible.   The patient is currently admitted and upon discharge could be taking Jardiance 10 mg.  The current 30 day co-pay is $117.62 due to a deductible.   The patient is currently admitted and upon discharge could be taking Farxiga 10 mg.  Product Not on Formulary  The patient is insured through [employer] Part D   This test claim was processed through [facility]- copay amounts may vary at other pharmacies due to pharmacy/plan contracts, or as the patient moves through the different stages of their insurance plan.  [name], CPHT Pharmacy Patient Advocate Specialist [facility] Patient Advocate Team Direct Number: [phone]  Fax: [phone]

## 2022-09-18 NOTE — Plan of Care (Signed)
Patient A&Ox4, from home, independent in room  

## 2022-09-18 NOTE — Progress Notes (Signed)
[facility] CLINIC CARDIOLOGY CONSULT NOTE       Patient ID: Sydney Gillespie MRN: [id number] DOB/AGE: 11/20/1940 82 y.o.  Admit date: 09/17/2022 Referring Physician Dr. Doree Albee  Primary Physician Dr. Marcello Fennel Primary Cardiologist Dr. Darrold Junker Reason for Consultation CHF  HPI: Sydney Devon "[name]" is an 81yoF with a PMH of multiple myeloma (dx 2012 s/p stem cell transplant, relapse 2021, ongoing tx), HFpEF, moderate MR, recent DVT (currently taking Eliquis) CKD 4, RA, palpitations who presented to [facility] ED from a rehab facility on 09/17/2022 in respiratory distress.  Cardiology is consulted for further assistance.  Interval history: -Awake and alert but very disoriented to time and situation today which is a marked change from yesterday -Clinical improvement in peripheral edema and volume status -Renal function stable after high doses of IV Lasix -Repeat EKG reviewed by myself and Dr. Darrold Junker shows sinus tachycardia with rate in the 110s  Review of systems complete and found to be negative unless listed above     Past Medical History:  Diagnosis Date   Acute respiratory failure with hypoxia (HCC) 05/07/2022   Acute respiratory failure with hypoxia (HCC) 05/07/2022   Allergic rhinitis    Allergy    Anemia    Anemia    Barrett's esophagus    Blood dyscrasia    multiple myloma remission   Cancer (HCC)    Change in bowel habits    Compression fracture 2013   Degenerative disc disease, lumbar    Dysrhythmia    palpitations   Esophageal reflux    Family history of adverse reaction to anesthesia    nausea -mom   GERD (gastroesophageal reflux disease)    Gout    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HCC)    Heart palpitations    Hyperlipidemia    Hypothyroidism    Inflammatory polyarthropathy (HCC)    Lumbago    Lumbar radiculitis    Lumbar stenosis with neurogenic claudication    Multiple myeloma (HCC)    Multiple myeloma (HCC)    Neuropathy    Bilateral Feet    Osteopenia    Osteoporosis    Other dysphagia    Palpitations    Pneumonia 04/2022   R/T COVID   Positive PPD    Renal insufficiency    Rheumatoid arthritis (HCC)    Rheumatoid arthritis (HCC)    Seasonal allergies    Tuberculosis 2011   Vertigo     Past Surgical History:  Procedure Laterality Date   ABDOMINAL HYSTERECTOMY     APPENDECTOMY     BACK SURGERY     BREAST EXCISIONAL BIOPSY Bilateral    neg   CATARACT EXTRACTION Bilateral    CHOLECYSTECTOMY     COLONOSCOPY WITH PROPOFOL N/A 11/12/2016   Procedure: COLONOSCOPY WITH PROPOFOL;  Surgeon: Scot Jun, MD;  Location: [facility] ENDOSCOPY;  Service: Endoscopy;  Laterality: N/A;   ESOPHAGOGASTRODUODENOSCOPY (EGD) WITH PROPOFOL N/A 11/12/2016   Procedure: ESOPHAGOGASTRODUODENOSCOPY (EGD) WITH PROPOFOL;  Surgeon: Scot Jun, MD;  Location: [facility] ENDOSCOPY;  Service: Endoscopy;  Laterality: N/A;   ESOPHAGOGASTRODUODENOSCOPY (EGD) WITH PROPOFOL N/A 07/17/2022   Procedure: ESOPHAGOGASTRODUODENOSCOPY (EGD) WITH PROPOFOL;  Surgeon: Regis Bill, MD;  Location: [facility] ENDOSCOPY;  Service: Endoscopy;  Laterality: N/A;   FOOT SURGERY     KYPHOPLASTY N/A 07/29/2022   Procedure: KYPHOPLASTY THORACIC EIGHT, THORACIC NINE, THORACIC TEN;  Surgeon: Tressie Stalker, MD;  Location: [facility] OR;  Service: Neurosurgery;  Laterality: N/A;  3C   LUMBAR LAMINECTOMY/DECOMPRESSION MICRODISCECTOMY Left 06/18/2016  Procedure: LAMINECTOMY FOR FACET/SYNOVIAL CYST LUMBAR THREE - LUMBAR FOUR LEFT;  Surgeon: Tressie Stalker, MD;  Location: [facility] OR;  Service: Neurosurgery;  Laterality: Left;  LAMINECTOMY FOR FACET/SYNOVIAL CYST LUMBAR THREE - LUMBAR FOUR LEFT    Medications Prior to Admission  Medication Sig Dispense Refill Last Dose   acetaminophen (TYLENOL) 325 MG tablet Take 2 tablets (650 mg total) by mouth every 6 (six) hours as needed for mild pain (or Fever >/= 101).   unk   allopurinol (ZYLOPRIM) 300 MG tablet Take 300 mg by mouth daily.    09/16/2022 at 0815   ALPRAZolam (XANAX) 0.25 MG tablet Take 1 tablet (0.25 mg total) by mouth 2 (two) times daily as needed. Once daily for anxiety or sleep (Patient taking differently: Take 0.25 mg by mouth 3 (three) times daily as needed for sleep or anxiety. Once daily for anxiety or sleep) 10 tablet 0 09/16/2022 at 1935   Ascorbic Acid (VITAMIN C) 1000 MG tablet Take 1,000 mg by mouth daily.   09/16/2022 at 0815   CALCIUM MAGNESIUM ZINC PO Take 1 tablet by mouth 2 (two) times daily. 1 am, 2 hs   09/16/2022 at 1934   ceFAZolin (ANCEF) IVPB Inject 2 g into the vein every 12 (twelve) hours for 13 days. Indication:  MSSA bacteremia and discitis First Dose: Yes Last Day of Therapy:  09/17/2022 Labs - Once weekly:  CBC/D, CMP, ESR and CRP Please pull PIC at completion of IV antibiotics Fax weekly lab results  promptly to [phone] Method of administration: IV Push Method of administration may be changed at the discretion of home infusion pharmacist based upon assessment of the patient and/or caregiver's ability to self-administer the medication ordered. 26 Units 0 09/16/2022 at 2112   Cholecalciferol (VITAMIN D3) 125 MCG (5000 UT) TABS Take 1 tablet by mouth daily.   09/16/2022 at 0958   docusate sodium (COLACE) 100 MG capsule Take 1 capsule (100 mg total) by mouth 2 (two) times daily. 30 capsule 0 09/16/2022 at 1805   ELIQUIS 5 MG TABS tablet TAKE 1 TABLET(5 MG) BY MOUTH TWICE DAILY (Patient taking differently: Take 5 mg by mouth 2 (two) times daily.) 60 tablet 1 09/16/2022 at 1934   furosemide (LASIX) 20 MG tablet Take 10 mg by mouth daily.   09/15/2022 at 0814   Heparin Sod, Pork, Lock Flush (HEPARIN LOCK FLUSH IV) Inject 5 mLs into the vein 2 (two) times daily.   09/16/2022 at 2112   hydroxychloroquine (PLAQUENIL) 200 MG tablet Take 200 mg by mouth See admin instructions. Patient currently takes 200 mg twice daily Monday through Friday and 200 mg once daily Saturday and Sunday.   09/16/2022 at 1934    Ipratropium-Albuterol (COMBIVENT) 20-100 MCG/ACT AERS respimat Inhale 1 puff into the lungs every 6 (six) hours as needed for wheezing. 4 g 0 unk   iron polysaccharides (NIFEREX) 150 MG capsule Take 1 capsule (150 mg total) by mouth daily. 30 capsule 1 09/16/2022 at 0958   levothyroxine (SYNTHROID) 137 MCG tablet Take 1 tablet (137 mcg total) by mouth daily at 6 (six) AM. 30 tablet 0 09/16/2022 at 0603   midodrine (PROAMATINE) 5 MG tablet Take 1 tablet (5 mg total) by mouth 3 (three) times daily with meals. 90 tablet 0 09/16/2022 at 0958   Multiple Vitamin (MULTIVITAMIN WITH MINERALS) TABS tablet Take 1 tablet by mouth daily. 30 tablet 0 09/16/2022 at 0815   nystatin-triamcinolone (MYCOLOG II) cream Apply 1 Application topically 2 (two) times daily  as needed (itching).   unk   OLANZapine (ZYPREXA) 10 MG tablet Take 1 tablet (10 mg total) by mouth at bedtime. 30 tablet 0 09/16/2022 at 2112   polyethylene glycol (MIRALAX / GLYCOLAX) 17 g packet Take 17 g by mouth daily. 30 each 0 09/16/2022 at 0815   Potassium 99 MG TABS Take 1 tablet by mouth daily.   09/16/2022 at 0815   rOPINIRole (REQUIP) 0.25 MG tablet Take 1 tablet (0.25 mg total) by mouth at bedtime. (Patient taking differently: Take 0.5 mg by mouth at bedtime.) 30 tablet 0 09/16/2022 at 1934   senna-docusate (SENOKOT-S) 8.6-50 MG tablet Take 1 tablet by mouth 2 (two) times daily. Hold if having loose or frequent stools. 30 tablet 0 09/16/2022 at 1805   sertraline (ZOLOFT) 50 MG tablet Take 1 tablet (50 mg total) by mouth daily. 30 tablet 3 09/16/2022 at 0815   vitamin B-12 (CYANOCOBALAMIN) 1000 MCG tablet Take 1,000 mcg by mouth daily.   09/16/2022 at 0815   zinc gluconate 50 MG tablet Take 50 mg by mouth daily.   09/16/2022 at 0958   feeding supplement (ENSURE ENLIVE / ENSURE PLUS) LIQD Take 237 mLs by mouth 3 (three) times daily between meals. 21330 mL 0    sodium chloride flush (NS) 0.9 % SOLN Inject 3 mLs into the vein every 12 (twelve) hours. 100 mL 0     Spacer/Aero-Holding Chambers DEVI 1 each by Does not apply route as needed. 1 each 0     Social History   Socioeconomic History   Marital status: Widowed    Spouse name: Not on file   Number of children: Not on file   Years of education: Not on file   Highest education level: Not on file  Occupational History   Not on file  Tobacco Use   Smoking status: Never   Smokeless tobacco: Never  Vaping Use   Vaping Use: Never used  Substance and Sexual Activity   Alcohol use: No   Drug use: No   Sexual activity: Yes  Other Topics Concern   Not on file  Social History Narrative   Not on file   Social Determinants of Health   Financial Resource Strain: Not on file  Food Insecurity: No Food Insecurity (09/04/2022)   Hunger Vital Sign    Worried About Running Out of Food in the Last Year: Never true    Ran Out of Food in the Last Year: Never true  Transportation Needs: No Transportation Needs (08/05/2022)   PRAPARE - Administrator, Civil Service (Medical): No    Lack of Transportation (Non-Medical): No  Physical Activity: Not on file  Stress: Not on file  Social Connections: Not on file  Intimate Partner Violence: Not At Risk (08/05/2022)   Humiliation, Afraid, Rape, and Kick questionnaire    Fear of Current or Ex-Partner: No    Emotionally Abused: No    Physically Abused: No    Sexually Abused: No    Family History  Problem Relation Age of Onset   Hypertension Mother    Heart attack Mother    Rheum arthritis Mother    Osteoarthritis Mother    Cancer Father    Hypertension Father    Stroke Father    Breast cancer Maternal Grandmother 40   Osteoarthritis Other    Rheum arthritis Other       Intake/Output Summary (Last 24 hours) at 09/18/2022 1633 Last data filed at 09/18/2022 1049 Gross per 24  hour  Intake --  Output 700 ml  Net -700 ml     Vitals:   09/17/22 1855 09/17/22 2259 09/18/22 0104 09/18/22 0825  BP: (!) 142/92  (!) 143/87 (!) 153/88   Pulse: 95  98 (!) 109  Resp: 16  20 20   Temp: 98.3 F (36.8 C)  98.1 F (36.7 C) 98.9 F (37.2 C)  TempSrc:      SpO2: 100% 98% 96% 97%  Weight:      Height:        PHYSICAL EXAM General: pleasant elderly Caucasian female, sitting upright in bed with granddaughter present.  HEENT:  Normocephalic and atraumatic. Neck:  + JVD.  Lungs: Normal respiratory effort on oxygen by nasal cannula.  Bibasilar crackles with wheezing in upper lung fields bilaterally.   Heart: Tachy but regular. Normal S1 and S2, 3/6 holosystolic murmur best heard at the apex. Abdomen: Non-distended appearing.  Msk: Normal strength and tone for age. Extremities: Warm and well perfused. No clubbing, cyanosis.  Trace-1+ bilateral lower extremity edema.  Neuro: Alert but disoriented to time and situation Psych:  Answers questions appropriately.   Labs: Basic Metabolic Panel: Recent Labs    09/17/22 0534 09/17/22 0751 09/17/22 1656 09/18/22 0452 09/18/22 0928  NA 140  --   --  142  --   K 3.0*  --  3.8 4.5  --   CL 107  --   --  107  --   CO2 21*  --   --  27  --   GLUCOSE 96  --   --  93  --   BUN 14  --   --  19  --   CREATININE 0.92  --   --  1.09*  --   CALCIUM 7.7*  --   --  8.0*  --   MG  --    < > 2.1  --  1.9  PHOS  --   --   --   --  2.9   < > = values in this interval not displayed.    Liver Function Tests: Recent Labs    09/17/22 0534 09/18/22 0452  AST 35 29  ALT <5 <5  ALKPHOS 108 112  BILITOT 0.6 0.5  PROT 5.3* 5.7*  ALBUMIN 2.4* 2.6*    No results for input(s): "LIPASE", "AMYLASE" in the last 72 hours. CBC: Recent Labs    09/17/22 0534 09/18/22 0452  WBC 10.2 12.4*  NEUTROABS 8.4*  --   HGB 8.0* 8.3*  HCT 25.7* 25.9*  MCV 95.5 95.6  PLT 241 275    Cardiac Enzymes: Recent Labs    09/17/22 0534 09/17/22 0751  TROPONINIHS 26* 31*    BNP: Recent Labs    09/17/22 0534  BNP 3,282.1*    D-Dimer: No results for input(s): "DDIMER" in the last 72  hours. Hemoglobin A1C: No results for input(s): "HGBA1C" in the last 72 hours. Fasting Lipid Panel: No results for input(s): "CHOL", "HDL", "LDLCALC", "TRIG", "CHOLHDL", "LDLDIRECT" in the last 72 hours. Thyroid Function Tests: No results for input(s): "TSH", "T4TOTAL", "T3FREE", "THYROIDAB" in the last 72 hours.  Invalid input(s): "FREET3" Anemia Panel: No results for input(s): "VITAMINB12", "FOLATE", "FERRITIN", "TIBC", "IRON", "RETICCTPCT" in the last 72 hours.   Radiology: ECHOCARDIOGRAM LIMITED  Result Date: 09/17/2022    ECHOCARDIOGRAM LIMITED REPORT   Patient Name:   Sydney Gillespie Date of Exam: 09/17/2022 Medical Rec #:  [id number]  Height:       65.0 in Accession #:    [id number]         Weight:       150.0 lb Date of Birth:  22-Apr-1941           BSA:          1.750 m Patient Age:    82 years           BP:           147/90 mmHg Patient Gender: F                  HR:           105 bpm. Exam Location:  [facility] Procedure: Limited Echo and Limited Color Doppler Indications:     CHF  History:         Patient has prior history of Echocardiogram examinations, most                  recent 09/01/2022. CHF, Signs/Symptoms:Bacteremia and Syncope;                  Risk Factors:Dyslipidemia. DVT, CKD.  Sonographer:     Mikki Harbor Referring Phys:  [id number] [facility] MICHELLE Marbin Olshefski Diagnosing Phys: Marcina Millard MD IMPRESSIONS  1. Left ventricular ejection fraction, by estimation, is 35 to 40%. The left ventricle has moderately decreased function. The left ventricle has no regional wall motion abnormalities. The left ventricular internal cavity size was mildly dilated. Indeterminate diastolic filling due to E-A fusion.  2. Right ventricular systolic function is normal. The right ventricular size is normal.  3. Left atrial size was moderately dilated.  4. The mitral valve is normal in structure. Severe mitral valve regurgitation. No evidence of mitral stenosis.  5. The aortic valve is normal in  structure. Aortic valve regurgitation is moderate to severe. No aortic stenosis is present.  6. The inferior vena cava is normal in size with greater than 50% respiratory variability, suggesting right atrial pressure of 3 mmHg. FINDINGS  Left Ventricle: Left ventricular ejection fraction, by estimation, is 35 to 40%. The left ventricle has moderately decreased function. The left ventricle has no regional wall motion abnormalities. The left ventricular internal cavity size was mildly dilated. There is no left ventricular hypertrophy. Indeterminate diastolic filling due to E-A fusion. Right Ventricle: The right ventricular size is normal. No increase in right ventricular wall thickness. Right ventricular systolic function is normal. Left Atrium: Left atrial size was moderately dilated. Right Atrium: Right atrial size was normal in size. Pericardium: There is no evidence of pericardial effusion. Mitral Valve: The mitral valve is normal in structure. Severe mitral valve regurgitation. No evidence of mitral valve stenosis. MV peak gradient, 14.3 mmHg. The mean mitral valve gradient is 5.0 mmHg. Tricuspid Valve: The tricuspid valve is normal in structure. Tricuspid valve regurgitation is not demonstrated. No evidence of tricuspid stenosis. Aortic Valve: The aortic valve is normal in structure. Aortic valve regurgitation is moderate to severe. No aortic stenosis is present. Pulmonic Valve: The pulmonic valve was normal in structure. Pulmonic valve regurgitation is not visualized. No evidence of pulmonic stenosis. Aorta: The aortic root is normal in size and structure. Venous: The inferior vena cava is normal in size with greater than 50% respiratory variability, suggesting right atrial pressure of 3 mmHg. IAS/Shunts: No atrial level shunt detected by color flow Doppler. LEFT VENTRICLE PLAX 2D LVIDd:         5.00 cm LVIDs:  3.70 cm LV PW:         0.80 cm LV IVS:        0.70 cm LVOT diam:     2.00 cm LVOT Area:      3.14 cm  LV Volumes (MOD) LV vol d, MOD A2C: 99.0 ml LV vol d, MOD A4C: 80.2 ml LV vol s, MOD A2C: 52.2 ml LV vol s, MOD A4C: 55.4 ml LV SV MOD A2C:     46.8 ml LV SV MOD A4C:     80.2 ml LV SV MOD BP:      35.7 ml LEFT ATRIUM         Index LA diam:    4.60 cm 2.63 cm/m   AORTA Ao Root diam: 3.40 cm MITRAL VALVE MV Area (PHT): 9.73 cm       SHUNTS MV Peak grad:  14.3 mmHg      Systemic Diam: 2.00 cm MV Mean grad:  5.0 mmHg MV Vmax:       1.89 m/s MV Vmean:      98.4 cm/s MV Decel Time: 78 msec MR Peak grad:    136.0 mmHg MR Mean grad:    84.0 mmHg MR Vmax:         583.00 cm/s MR Vmean:        424.0 cm/s MR PISA:         1.57 cm MR PISA Eff ROA: 25 mm MR PISA Radius:  0.50 cm MV E velocity: 160.00 cm/s Marcina Millard MD Electronically signed by Marcina Millard MD Signature Date/Time: 09/17/2022/5:26:11 PM    Final    CT Angio Chest PE W and/or Wo Contrast  Result Date: 09/17/2022 CLINICAL DATA:  Respiratory distress. EXAM: CT ANGIOGRAPHY CHEST WITH CONTRAST TECHNIQUE: Multidetector CT imaging of the chest was performed using the standard protocol during bolus administration of intravenous contrast. Multiplanar CT image reconstructions and MIPs were obtained to evaluate the vascular anatomy. RADIATION DOSE REDUCTION: This exam was performed according to the departmental dose-optimization program which includes automated exposure control, adjustment of the mA and/or kV according to patient size and/or use of iterative reconstruction technique. CONTRAST:  40mL OMNIPAQUE IOHEXOL 350 MG/ML SOLN COMPARISON:  Chest radiograph 1 day prior.  CTA chest 08/05/2022. FINDINGS: Cardiovascular: There is adequate opacification of the pulmonary arteries to the segmental level. There is no evidence of pulmonary embolism. The main pulmonary artery is enlarged measuring up to 3.3 cm. There is mild calcified plaque in the nonaneurysmal thoracic aorta. The heart size is stable. There is a small pericardial effusion,  slightly increased in size since the prior CT. A right upper extremity PICC is in place. The tip is obscured by contrast. Mediastinum/Nodes: The thyroid is unremarkable. The esophagus is grossly unremarkable. There is no mediastinal, hilar, or axillary lymphadenopathy. Lungs/Pleura: The trachea and central airways are patent. There are moderate-sized bilateral pleural effusions, right larger than left, with adjacent atelectasis in the lower lobes. There is additional probable atelectasis in the lingula. There is no convincing interlobular septal thickening. There is no pneumothorax. Upper Abdomen: Cholecystectomy clips are noted. The imaged portions of the upper abdominal viscera are otherwise unremarkable. Musculoskeletal: There is no acute osseous abnormality or suspicious osseous lesion. The patient is status post vertebral augmentation at T8 through T10 with near-complete collapse of the T9 vertebral body. The appearance is unchanged. Mild compression deformity of the T12 vertebral body is unchanged. Review of the MIP images confirms the above findings. IMPRESSION: 1. No evidence  of pulmonary embolism. 2. Moderate-sized bilateral pleural effusions, right larger than left, with adjacent atelectasis in the lower lobes. 3. Small pericardial effusion, slightly increased in size since the prior CT. 4. Enlarged main pulmonary artery suggesting pulmonary hypertension. Electronically Signed   By: Lesia Hausen M.D.   On: 09/17/2022 08:45   DG Chest Portable 1 View  Result Date: 09/17/2022 CLINICAL DATA:  Respiratory distress. EXAM: PORTABLE CHEST 1 VIEW COMPARISON:  09/02/2022 FINDINGS: Diffuse interstitial and hazy airspace opacity. Cardiomegaly. Small pleural effusions are likely. Right PICC with tip at the upper SVC. No pneumothorax. IMPRESSION: 1. Diffuse pulmonary opacity which could be edema or infection. 2. Low volume chest with chronic cardiomegaly. Electronically Signed   By: Tiburcio Pea M.D.   On:  09/17/2022 06:01   MR BRAIN WO CONTRAST  Result Date: 09/03/2022 CLINICAL DATA:  Initial evaluation for mental status change, unknown cause. EXAM: MRI HEAD WITHOUT CONTRAST TECHNIQUE: Multiplanar, multiecho pulse sequences of the brain and surrounding structures were obtained without intravenous contrast. COMPARISON:  Prior CT from 09/01/2022. FINDINGS: Brain: Cerebral volume within normal limits for age. Patchy T2/FLAIR hyperintensity involving the periventricular deep white matter both cerebral hemispheres, consistent with chronic small vessel ischemic disease, mild for age. No evidence for acute or subacute infarct. Gray-white matter differentiation maintained. No areas of chronic cortical infarction. No acute or chronic intracranial blood products. No mass lesion, midline shift or mass effect. No hydrocephalus or extra-axial fluid collection. Pituitary gland and suprasellar region within normal limits. Vascular: Major intracranial vascular flow voids are maintained. Mild torcular inversion noted. Skull and upper cervical spine: Craniocervical junction within normal limits. Bone marrow signal intensity normal. No scalp soft tissue abnormality. Sinuses/Orbits: Prior bilateral ocular lens replacement. Paranasal sinuses are largely clear. No significant mastoid effusion. Other: None. IMPRESSION: 1. No acute intracranial abnormality. 2. Mild chronic microvascular ischemic disease for age. Electronically Signed   By: Rise Mu M.D.   On: 09/03/2022 01:52   ECHOCARDIOGRAM LIMITED  Result Date: 09/02/2022    ECHOCARDIOGRAM LIMITED REPORT   Patient Name:   Sydney Gillespie Date of Exam: 09/01/2022 Medical Rec #:  [id number]          Height:       65.0 in Accession #:    [id number]         Weight:       121.3 lb Date of Birth:  1940/09/06           BSA:          1.599 m Patient Age:    82 years           BP:           97/48 mmHg Patient Gender: F                  HR:           64 bpm. Exam Location:   [facility] Procedure: 2D Echo, Cardiac Doppler, Limited Echo and Limited Color Doppler Indications:     R55 Syncope  History:         Patient has prior history of Echocardiogram examinations, most                  recent 08/09/2022. Arrythmias:Palpitations; Risk                  Factors:Dyslipidemia.  Sonographer:     Daphine Deutscher RDCS Referring Phys:  [id number] Verdene Lennert Diagnosing Phys: Alwyn Pea MD IMPRESSIONS  1. Left ventricular ejection fraction, by estimation, is 50 to 55%. The left ventricle has low normal function. The left ventricle demonstrates regional wall motion abnormalities (see scoring diagram/findings for description). Left ventricular diastolic  parameters were normal.  2. Right ventricular systolic function is normal. The right ventricular size is normal.  3. Left atrial size was mildly dilated.  4. A small pericardial effusion is present.  5. The mitral valve is normal in structure. Trivial mitral valve regurgitation.  6. The aortic valve is calcified. Aortic valve regurgitation is mild to moderate. Aortic valve sclerosis/calcification is present, without any evidence of aortic stenosis. FINDINGS  Left Ventricle: Left ventricular ejection fraction, by estimation, is 50 to 55%. The left ventricle has low normal function. The left ventricle demonstrates regional wall motion abnormalities. The left ventricular internal cavity size was normal in size. There is no left ventricular hypertrophy. Left ventricular diastolic parameters were normal. Right Ventricle: The right ventricular size is normal. No increase in right ventricular wall thickness. Right ventricular systolic function is normal. Left Atrium: Left atrial size was mildly dilated. Right Atrium: Right atrial size was not assessed. Pericardium: A small pericardial effusion is present. Mitral Valve: The mitral valve is normal in structure. Trivial mitral valve regurgitation. Tricuspid Valve: The tricuspid valve is normal in  structure. Tricuspid valve regurgitation is mild. Aortic Valve: The aortic valve is calcified. Aortic valve regurgitation is mild to moderate. Aortic regurgitation PHT measures 572 msec. Aortic valve sclerosis/calcification is present, without any evidence of aortic stenosis. Pulmonic Valve: The pulmonic valve was normal in structure. Pulmonic valve regurgitation is not visualized. Aorta: The ascending aorta was not well visualized. IAS/Shunts: The interatrial septum was not well visualized. Additional Comments: There is no pleural effusion.  LEFT VENTRICLE PLAX 2D LVIDd:         4.10 cm Diastology LVIDs:         3.00 cm LV e' medial:    6.14 cm/s LV PW:         1.00 cm LV E/e' medial:  10.7 LV IVS:        1.00 cm LV e' lateral:   8.22 cm/s                        LV E/e' lateral: 8.0  RIGHT VENTRICLE             IVC RV S prime:     13.40 cm/s  IVC diam: 1.10 cm TAPSE (M-mode): 1.8 cm LEFT ATRIUM         Index LA diam:    4.60 cm 2.88 cm/m  AORTIC VALVE AI PHT:      572 msec  AORTA Ao Root diam: 3.40 cm MITRAL VALVE               TRICUSPID VALVE MV Area (PHT): 2.95 cm    TR Peak grad:   14.7 mmHg MV Decel Time: 257 msec    TR Vmax:        192.00 cm/s MV E velocity: 65.50 cm/s MV A velocity: 90.70 cm/s MV E/A ratio:  0.72 Dwayne D Callwood MD Electronically signed by Alwyn Pea MD Signature Date/Time: 09/02/2022/7:27:49 AM    Final    DG Chest Port 1 View  Result Date: 09/02/2022 CLINICAL DATA:  PICC placement EXAM: PORTABLE CHEST 1 VIEW COMPARISON:  Yesterday FINDINGS: Right upper extremity PICC with tip at the SVC. Cardiomegaly. Streaky opacity in the bilateral lungs. No visible effusion  or pneumothorax. Chronic cardiomegaly. Multilevel cement augmentation in the thoracic spine. Gas distended upper abdominal colon. IMPRESSION: 1. Right upper extremity PICC with tip at the SVC. 2. Cardiomegaly and borderline vascular congestion. Atelectatic type opacity in the bilateral lungs. Electronically Signed   By:  Tiburcio Pea M.D.   On: 09/02/2022 04:34   US RENAL  Result Date: 09/01/2022 CLINICAL DATA:  [id number] AKI (acute kidney injury) [id number] EXAM: RENAL / URINARY TRACT ULTRASOUND COMPLETE COMPARISON:  11/04/2017 FINDINGS: Right Kidney: Renal measurements: 9.6 x 4.5 x 4.6 cm = volume: 102 mL. Parenchyma echogenic compared to adjacent liver. No focal lesion or hydronephrosis. Left Kidney: Renal measurements: 8.2 x 4.2 x 3.7 cm = volume: 67 mL. No hydronephrosis. Exophytic 3.1 x 2.7 x 2.9 cm cyst from lower pole as before. Bladder: Incompletely distended.  Bilateral ureteral jets demonstrated. Other: Bilateral pleural effusions incidentally noted. IMPRESSION: 1. No hydronephrosis. 2. Echogenic renal parenchyma suggesting medical renal disease. 3. 3.1 cm left renal cyst. 4. Bilateral pleural effusions. Electronically Signed   By: Corlis Leak M.D.   On: 09/01/2022 18:35   DG Chest Portable 1 View  Result Date: 09/01/2022 CLINICAL DATA:  Recent pneumonia EXAM: PORTABLE CHEST 1 VIEW COMPARISON:  08/07/2022 FINDINGS: Previously noted bibasilar and right lung opacities are improved, with some residual opacities in the mid to lower right lung and lower left lung, which are somewhat linear and may reflect atelectasis or infection. Unchanged cardiac and mediastinal contours. Aortic atherosclerosis. No pleural effusion or pneumothorax. No acute osseous abnormality. Prior ACDF. IMPRESSION: Previously noted bibasilar and right lung opacities are improved, with some residual opacities in the mid to lower right lung and lower left lung, which may reflect atelectasis and/or infection. Electronically Signed   By: Wiliam Ke M.D.   On: 09/01/2022 15:26   CT HEAD WO CONTRAST ( )  Result Date: 09/01/2022 CLINICAL DATA:  Trauma EXAM: CT HEAD WITHOUT CONTRAST TECHNIQUE: Contiguous axial images were obtained from the base of the skull through the vertex without intravenous contrast. RADIATION DOSE REDUCTION: This exam was  performed according to the departmental dose-optimization program which includes automated exposure control, adjustment of the mA and/or kV according to patient size and/or use of iterative reconstruction technique. COMPARISON:  None Available. FINDINGS: Brain: No evidence of acute infarction, hemorrhage, hydrocephalus, extra-axial collection or mass lesion/mass effect. Sequela of moderate chronic microvascular ischemic change. Vascular: No hyperdense vessel or unexpected calcification. Skull: Normal. Negative for fracture or focal lesion. Sinuses/Orbits: No mastoid or middle ear effusion. Paranasal sinuses are notable for frothy secretions in the bilateral sphenoid sinuses, which can be seen in the setting sinusitis. Bilateral lens replacement. Orbits are otherwise unremarkable. Other: None. IMPRESSION: 1. No acute intracranial abnormality. 2. Frothy secretions in the bilateral sphenoid sinuses can be seen in the setting of acute sinusitis. Electronically Signed   By: Lorenza Cambridge M.D.   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)   Hypothyroidism   Rheumatoid arthritis (HCC)   CKD (chronic kidney disease), stage IV (HCC)   Elevated troponin   Acute deep vein thrombosis (DVT) of calf muscle vein of right lower  extremity (HCC)   MSSA bacteremia   Acute respiratory failure with hypoxia (HCC)   Acute on chronic diastolic CHF (congestive heart failure) (HCC)   Obstructive lung disease (generalized) (HCC)   Hypokalemia    ASSESSMENT AND PLAN:  Sydney Devon "[name]" is an 39yoF with a PMH of multiple myeloma (dx 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  Cardiology is consulted for further assistance.  # Delirium Awake and alert but disoriented to time and situation.  Per granddaughter, she has a history of confusion during her previous hospitalizations.  Suspect hospital delirium, neuroexam is nonfocal.  Recommend frequent reorientation and minimal disturbances in the evening  # Acute on chronic HFrEF # Moderate-severe mitral regurgitation BNP markedly elevated at 3200 with pleural effusions bilaterally on chest x-ray and CTA chest.  Clinically hypervolemic on exam with crackles, JVD, with significant peripheral edema, clinical improvement after initial IV diuresis.  Suspect some decompensation in part from stopping her diuretic within the past 10 days and some continued reduction in her EF with multiple severe illnesses requiring hospitalization over the past couple months.  Her MR is moderate to severe by echo this admission. -Continue diuresis with IV Lasix 60 mg x 1 today, likely decrease dose and frequency to 40 mg IV once a day tomorrow morning after recheck of BMP -Likely change previous home thiazide diuretic to loop diuretic at discharge -Monitor and replenish electrolytes for goal K >4, mag >2 -Continue metoprolol tartrate 25 mg twice a day rather than her previous home medication atenolol -Further GDMT as BP and renal function tolerate with ARB/ARNI, MRA, SGLT2i -Will need close clinic follow-up outpatient for further surveillance of her MR  # Sinus tachycardia  In the setting of respiratory distress and hypoxia, also possible rebound tachycardia after stopping atenolol abruptly after her last hospital discharge.  Will start metoprolol as above, and continue Eliquis 5 mg twice daily as treatment of her recent DVT as below.  repeat EKG reviewed by myself  and Dr. Darrold Junker demonstrate sinus tachycardia -  continue telemetry monitoring. -Monitor and replenish electrolytes as above  # Recent MSSA bacteremia PICC line present, antibiotics per primary/infectious disease  # History of DVT Eliquis 5 mg twice daily as above  # Demand ischemia Borderline elevated and flat trending, in the absence of ischemic EKG changes and in the setting of acute on chronic CHF, this is most consistent with demand/supply mismatch and not ACS   This patient's plan of care was discussed and created with Dr. Darrold Junker and he is in agreement.  Signed: Rebeca Allegra , PA-C 09/18/2022, 4:33 PM [facility]

## 2022-09-18 NOTE — Progress Notes (Signed)
Sydney Gillespie ZOX:[id number] DOB: Mar 09, 1941 DOA: 09/17/2022 PCP: Barbette Reichmann, MD   Subj: Sydney Gillespie is a 82 y.o. WF PMHx recent MSSA bacteremia on cefazolin, multiple myeloma, CKD stage III, obstructive lung disease, Chronic Systolic CHF (LVEF 35 to 40%), HTN, HLD, RA, lumbar DJD, Hx of DVT on Apixaban, Anemia, TB  Presenting with acute respiratory failure with hypoxia, volume overload.  Patient noted to have been recently admitted April 23 through April 29 for acute on chronic kidney disease.  Received IV hydration and discharge.  Per the granddaughter, patient has had worsening lower extremity swelling since discharge.  Has been taking outpatient diuretics still with lower extremity swelling.  Compression hose were not placed at facility per report.  Compression hose were then placed recently with the past 1 to 2 days with patient developing significant shortness of breath.  No fevers or chills.  No nausea or vomiting.  No chest pain.  No abdominal pain.  Developed significant shortness of breath over the past 12 to 24 hours.  No reported NSAID use or high salt intake. Presented to the ER afebrile, heart rate in the 120s, initially requiring BiPAP.  Given 60 mg IV Lasix with patient being transition to 45 L nasal cannula.  White count 10, hemoglobin 8, troponin 26-31, COVID negative, creatinine 0.92.  Potassium 3.0, BNP 3282.  EKG sinus tach.  CTA of the chest negative for PE but does show moderate-sized bilateral pleural effusions right greater than left, small pericardial effusion as well as findings concerning for pulmonary hypertension   Obj: A/O x 4, some mild confusion as to specific instances on what specific dates.  Negative SOB, negative CP.  Positive abdominal pain when she sits up (sounds muscular from her description)   Objective: VITAL SIGNS: Temp: 98.9 F (37.2 C) (05/10 0825) BP: 153/88 (05/10 0825) Pulse Rate: 109 (05/10 0825)   VENTILATOR  SETTINGS: **  Procedures/Significant Events: 3/29 MRI T-spine W/W. Wo contrast Status post augmentation at T8, T9 and T10 without progression of height loss. 2. 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 Small amount of ventral epidural enhancing material may be dilated venous plexus or small epidural abscess/phlegmon. 3. Moderate T9 spinal canal stenosis. ----------------------------------------------------------------------------------------------------------------------------------------------0 5/9 echocardiogram limited  Left Ventricle: LVEF= 35 to 40%. The left ventricle has moderately decreased function. -Indeterminate diastolic  Left Atrium: moderately dilated.  Mitral Valve:  Severe mitral valve regurgitation.  Aortic Valve: regurgitation is moderate to severe.  5/9 CTA PE protocol  Moderate-sized bilateral pleural effusions, right larger than left, with adjacent atelectasis in the lower lobes. 3. Small pericardial effusion, slightly increased in size since the prior CT. 4. Enlarged main pulmonary artery suggesting pulmonary hypertension.   Consultants:  Cardiology   Cultures 3/27 blood positive MSSA  ---------------------------------------------------------------------------------------------------------------------------------------------------- 5/9 blood RIGHT hand NGTD 5/9 blood RIGHT arm NGTD    Antimicrobials: Anti-infectives (From admission, onward)    Start     Ordered Stop   09/19/22 1000  hydroxychloroquine (PLAQUENIL) tablet 200 mg        09/17/22 1006     09/17/22 1030  hydroxychloroquine (PLAQUENIL) tablet 200 mg       Note to Pharmacy: Patient currently takes 200 mg twice daily Monday through Friday and 200 mg once daily Saturday and Sunday.   09/17/22 0949     09/17/22 1015  ceFAZolin (ANCEF) IVPB 2g/100 mL premix        05 /09/24 1009 09/17/22 2200   09/17/22 1000  ceFAZolin (ANCEF) IVPB  Status:  Discontinued  Note to  Pharmacy: Indication:  MSSA bacteremia and discitis First Dose: Yes Last Day of Therapy:  09/17/2022 Labs - Once weekly:  CBC/D, CMP, ESR and CRP Please pull PIC at completion of IV antibiotics Fax weekly lab results  promptly to [phone] Method of administration: IV Push Method of administration m   09/17/22 0949 09/17/22 1009        Intake/Output Summary (Last 24 hours) at 09/18/2022 0857 Last data filed at 09/18/2022 0555 Gross per 24 hour  Intake 100 ml  Output 450 ml  Net -350 ml     Exam: Physical Exam:  General: A/O x 4, mild confusion, No acute respiratory distress, cachectic Eyes: negative scleral hemorrhage, negative anisocoria, negative icterus ENT: Negative Runny nose, negative gingival bleeding, Neck:  Negative scars, masses, torticollis, lymphadenopathy, JVD Lungs: Clear to auscultation bilaterally without wheezes or crackles Cardiovascular: Regular rate and rhythm without murmur gallop or rub normal S1 and S2 Abdomen: negative abdominal pain, nondistended, positive soft, bowel sounds, no rebound, no ascites, no appreciable mass Extremities: No significant cyanosis, clubbing, or edema bilateral lower extremities Skin: Negative rashes, lesions, ulcers Psychiatric:  Negative depression, negative anxiety, negative fatigue, negative mania  Central nervous system:  Cranial nerves II through XII intact, tongue/uvula midline, all extremities muscle strength 5/5, sensation intact throughout, negative dysarthria, negative expressive aphasia, negative receptive aphasia.   .   DVT prophylaxis: Apixaban Code Status:  Family Communication: 5/10 granddaughter (HCPOA)at bedside for discussion plan of care all questions answered status is: Inpatient    Dispo: The patient is from: SNF              Anticipated d/c is to: SNF              Anticipated d/c date is: > 3 days              Patient currently is not medically stable to d/c.      Assessment & Plan: Covid  vaccination;   Active Problems:   Acute respiratory failure with hypoxia (HCC)   Acute on chronic diastolic CHF (congestive heart failure) (HCC)   CKD (chronic kidney disease), stage IV (HCC)   Elevated troponin   Gout   MSSA bacteremia   HLD (hyperlipidemia)   Anemia due to stage 3 chronic kidney disease (HCC)   Hypothyroidism   Rheumatoid arthritis (HCC)   Acute deep vein thrombosis (DVT) of calf muscle vein of right lower extremity (HCC)   Obstructive lung disease (generalized) (HCC)   Hypokalemia   Acute Systolic CHF (current LVEF 35 to 40%) -April 2024 echocardiogram LVEF 50 to 55% -Acute respiratory failure with volume overload with noted BNP of 3300 -Reach out to cardiology for consultation -Strict in and out - Daily weight -5/10 Lasix IV 60 mg BID---> decrease to 40mg  daily per cardiology recommendation  -5/10 metoprolol 25 mg BID  Elevated troponin Troponins 20s to 30s in the setting of volume overload Suspect secondary heart strain EKG grossly stable Follow  Hypoalbuminemia -5/10 albumin 50 g x 1    Acute respiratory failure with hypoxia (HCC) -Decompensated respiratory status initially requiring BiPAP-now on 2 L in the setting of flash pulmonary edema and volume overload -CTA chest negative for PE, + moderate bilateral pleural effusions and small pericardial effusion, ? Pulm HTN  -Lasix IV 60 mg BID -5/10 per cardiology    -Decrease Lasix IV 60 mg daily  -Optimize HF medication  -MR is worse than previous at mod-severe.  CKD (chronic kidney disease), stage IV (Baseline creatinine 1-1.7) -If above is truly her baseline creatinine now stage IV CKD.   -Will continue to diurese secondary to above and determine what baseline creatinine actually is.   Lab Results  Component Value Date   CREATININE 1.09 (H) 09/18/2022   CREATININE 0.92 09/17/2022   CREATININE 1.20 (H) 09/07/2022   CREATININE 1.33 (H) 09/06/2022   CREATININE 1.47 (H) 09/05/2022    Hx  MSSA bacteremia -Diagnosed 3/27 has been on antibiotics since. -On IV cefazolin with today May 9 being the last day of treatment.  However will continue until obtain ID consult in a.m. given that repeat labs are pending to see if she is actually clear infection.  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  See MSSA bacteremia  Obstructive lung disease (generalized) (HCC) -Stable, SOB secondary to fluid overload. - See acute respiratory failure with hypoxia   Acute deep vein thrombosis (DVT) of calf muscle vein of right lower extremity (HCC) -Continue Eliquis   Rheumatoid arthritis (HCC) -Continue Plaquenil    Hypothyroidism -Synthroid 137 mcg daily   Anemia due to CKD stage III (baseline HgB~8) -Transfuse for hemoglobin<7   HLD (hyperlipidemia) -Lipid panel pending - Will start patient on statin based on findings  Gout -Allopurinol 300 mg daily   Hypokalemia -Potassium goal> 4    Hypocalcemia - Calcium goal> 8.9      Mobility Assessment (last 72 hours)     Mobility Assessment     Row Name 09/17/22 1750           Does patient have an order for bedrest or is patient medically unstable No - Continue assessment       What is the highest level of mobility based on the progressive mobility assessment? Level 4 (Walks with assist in room) - Balance while marching in place and cannot step forward and back - Complete       Is the above level different from baseline mobility prior to current illness? No - Consider discontinuing PT/OT                     Time: 50 minutes         Care during the described time interval was provided by me .  I have reviewed this patient's available data, including medical history, events of note, physical examination, and all test results as part of my evaluation.

## 2022-09-19 DIAGNOSIS — J9601 Acute respiratory failure with hypoxia: Secondary | ICD-10-CM | POA: Diagnosis not present

## 2022-09-19 DIAGNOSIS — R7881 Bacteremia: Secondary | ICD-10-CM | POA: Diagnosis not present

## 2022-09-19 DIAGNOSIS — N183 Chronic kidney disease, stage 3 unspecified: Secondary | ICD-10-CM | POA: Diagnosis not present

## 2022-09-19 DIAGNOSIS — Z515 Encounter for palliative care: Secondary | ICD-10-CM

## 2022-09-19 DIAGNOSIS — N184 Chronic kidney disease, stage 4 (severe): Secondary | ICD-10-CM | POA: Diagnosis not present

## 2022-09-19 DIAGNOSIS — J449 Chronic obstructive pulmonary disease, unspecified: Secondary | ICD-10-CM

## 2022-09-19 DIAGNOSIS — Z7189 Other specified counseling: Secondary | ICD-10-CM

## 2022-09-19 DIAGNOSIS — I5023 Acute on chronic systolic (congestive) heart failure: Secondary | ICD-10-CM | POA: Diagnosis not present

## 2022-09-19 LAB — COMPREHENSIVE METABOLIC PANEL
ALT: 8 U/L (ref 0–44)
AST: 117 U/L — ABNORMAL HIGH (ref 15–41)
Albumin: 3 g/dL — ABNORMAL LOW (ref 3.5–5.0)
Alkaline Phosphatase: 231 U/L — ABNORMAL HIGH (ref 38–126)
Anion gap: 10 (ref 5–15)
BUN: 24 mg/dL — ABNORMAL HIGH (ref 8–23)
CO2: 24 mmol/L (ref 22–32)
Calcium: 8.1 mg/dL — ABNORMAL LOW (ref 8.9–10.3)
Chloride: 107 mmol/L (ref 98–111)
Creatinine, Ser: 1.21 mg/dL — ABNORMAL HIGH (ref 0.44–1.00)
GFR, Estimated: 45 mL/min — ABNORMAL LOW (ref >60)
Glucose, Bld: 79 mg/dL (ref 70–99)
Potassium: 3.8 mmol/L (ref 3.5–5.1)
Sodium: 141 mmol/L (ref 135–145)
Total Bilirubin: 0.8 mg/dL (ref 0.3–1.2)
Total Protein: 5.8 g/dL — ABNORMAL LOW (ref 6.5–8.1)

## 2022-09-19 LAB — CBC WITH DIFFERENTIAL/PLATELET
Abs Immature Granulocytes: 0.1 10*3/uL — ABNORMAL HIGH (ref 0.00–0.07)
Abs Immature Granulocytes: 0.11 10*3/uL — ABNORMAL HIGH (ref 0.00–0.07)
Basophils Absolute: 0.1 10*3/uL (ref 0.0–0.1)
Basophils Absolute: 0.1 10*3/uL (ref 0.0–0.1)
Basophils Relative: 1 %
Basophils Relative: 1 %
Eosinophils Absolute: 0 10*3/uL (ref 0.0–0.5)
Eosinophils Absolute: 0 10*3/uL (ref 0.0–0.5)
Eosinophils Relative: 0 %
Eosinophils Relative: 0 %
HCT: 22.2 % — ABNORMAL LOW (ref 36.0–46.0)
HCT: 22.9 % — ABNORMAL LOW (ref 36.0–46.0)
Hemoglobin: 7 g/dL — ABNORMAL LOW (ref 12.0–15.0)
Hemoglobin: 7.2 g/dL — ABNORMAL LOW (ref 12.0–15.0)
Immature Granulocytes: 1 %
Immature Granulocytes: 1 %
Lymphocytes Relative: 14 %
Lymphocytes Relative: 17 %
Lymphs Abs: 1.1 10*3/uL (ref 0.7–4.0)
Lymphs Abs: 1.4 10*3/uL (ref 0.7–4.0)
MCH: 30.4 pg (ref 26.0–34.0)
MCH: 30.1 pg (ref 26.0–34.0)
MCHC: 31.5 g/dL (ref 30.0–36.0)
MCHC: 31.4 g/dL (ref 30.0–36.0)
MCV: 96.5 fL (ref 80.0–100.0)
MCV: 95.8 fL (ref 80.0–100.0)
Monocytes Absolute: 0.6 10*3/uL (ref 0.1–1.0)
Monocytes Absolute: 0.7 10*3/uL (ref 0.1–1.0)
Monocytes Relative: 8 %
Monocytes Relative: 8 %
Neutro Abs: 5.9 10*3/uL (ref 1.7–7.7)
Neutro Abs: 6.1 10*3/uL (ref 1.7–7.7)
Neutrophils Relative %: 76 %
Neutrophils Relative %: 73 %
Platelets: 206 10*3/uL (ref 150–400)
Platelets: 209 10*3/uL (ref 150–400)
RBC: 2.3 MIL/uL — ABNORMAL LOW (ref 3.87–5.11)
RBC: 2.39 MIL/uL — ABNORMAL LOW (ref 3.87–5.11)
RDW: 19.6 % — ABNORMAL HIGH (ref 11.5–15.5)
RDW: 19.5 % — ABNORMAL HIGH (ref 11.5–15.5)
WBC: 7.8 10*3/uL (ref 4.0–10.5)
WBC: 8.3 10*3/uL (ref 4.0–10.5)
nRBC: 0 % (ref 0.0–0.2)
nRBC: 0 % (ref 0.0–0.2)

## 2022-09-19 LAB — LIPID PANEL
Cholesterol: 89 mg/dL (ref 0–200)
HDL: 53 mg/dL (ref >40)
LDL Cholesterol: 23 mg/dL (ref 0–99)
Total CHOL/HDL Ratio: 1.7 RATIO
Triglycerides: 67 mg/dL (ref <150)
VLDL: 13 mg/dL (ref 0–40)

## 2022-09-19 LAB — PHOSPHORUS: Phosphorus: 2.7 mg/dL (ref 2.5–4.6)

## 2022-09-19 LAB — TYPE AND SCREEN: Unit division: 0

## 2022-09-19 LAB — CULTURE, BLOOD (ROUTINE X 2)
Culture: NO GROWTH
Special Requests: ADEQUATE

## 2022-09-19 LAB — C-REACTIVE PROTEIN: CRP: 8.1 mg/dL — ABNORMAL HIGH (ref <1.0)

## 2022-09-19 LAB — BPAM RBC: Blood Product Expiration Date: 202405282359

## 2022-09-19 LAB — PREPARE RBC (CROSSMATCH)

## 2022-09-19 LAB — SEDIMENTATION RATE: Sed Rate: 46 mm/hr — ABNORMAL HIGH (ref 0–30)

## 2022-09-19 LAB — MAGNESIUM: Magnesium: 1.7 mg/dL (ref 1.7–2.4)

## 2022-09-19 MED ORDER — CEFADROXIL 500 MG PO CAPS
500.0000 mg | ORAL_CAPSULE | Freq: Two times a day (BID) | ORAL | Status: DC
  Administered 2022-09-19 – 2022-09-23 (×9): 500 mg via ORAL
  Filled 2022-09-19 (×9): qty 1

## 2022-09-19 MED ORDER — SODIUM CHLORIDE 0.9% IV SOLUTION: Freq: Once | INTRAVENOUS | Status: AC

## 2022-09-19 MED ORDER — ATORVASTATIN CALCIUM 20 MG PO TABS
40.0000 mg | ORAL_TABLET | Freq: Every day | ORAL | Status: DC
  Administered 2022-09-19 – 2022-09-23 (×5): 40 mg via ORAL
  Filled 2022-09-19 (×5): qty 2

## 2022-09-19 MED ORDER — MAGNESIUM SULFATE 2 GM/50ML IV SOLN
2.0000 g | Freq: Once | INTRAVENOUS | Status: AC
  Administered 2022-09-19: 2 g via INTRAVENOUS
  Filled 2022-09-19: qty 50

## 2022-09-19 NOTE — Progress Notes (Signed)
Sydney Gillespie:[id number] DOB: 1941/02/10 DOA: 09/17/2022 PCP: Barbette Reichmann, MD   Subj: Sydney Gillespie is a 82 y.o. WF PMHx recent MSSA bacteremia on cefazolin, multiple myeloma, CKD stage III, obstructive lung disease, Chronic Systolic CHF (LVEF 35 to 40%), HTN, HLD, RA, lumbar DJD, Hx of DVT on Apixaban, Anemia, TB  Presenting with acute respiratory failure with hypoxia, volume overload.  Patient noted to have been recently admitted April 23 through April 29 for acute on chronic kidney disease.  Received IV hydration and discharge.  Per the granddaughter, patient has had worsening lower extremity swelling since discharge.  Has been taking outpatient diuretics still with lower extremity swelling.  Compression hose were not placed at facility per report.  Compression hose were then placed recently with the past 1 to 2 days with patient developing significant shortness of breath.  No fevers or chills.  No nausea or vomiting.  No chest pain.  No abdominal pain.  Developed significant shortness of breath over the past 12 to 24 hours.  No reported NSAID use or high salt intake. Presented to the ER afebrile, heart rate in the 120s, initially requiring BiPAP.  Given 60 mg IV Lasix with patient being transition to 45 L nasal cannula.  White count 10, hemoglobin 8, troponin 26-31, COVID negative, creatinine 0.92.  Potassium 3.0, BNP 3282.  EKG sinus tach.  CTA of the chest negative for PE but does show moderate-sized bilateral pleural effusions right greater than left, small pericardial effusion as well as findings concerning for pulmonary hypertension   Obj: 5/11 afebrile overnight, A/O x 4 (RN favour states waxing and waning: Hallucinating seeing/talking to people that are not room), negative SOB,    Objective: VITAL SIGNS: Temp: 98.6 F (37 C) (05/10 2308) BP: 130/88 (05/10 2308) Pulse Rate: 103 (05/10 2308)   VENTILATOR SETTINGS: Nasal cannula 5/11 Flow 2 L/min SpO2  98%   Procedures/Significant Events: 3/29 MRI T-spine W/W. Wo contrast Status post augmentation at T8, T9 and T10 without progression of height loss. 2. 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 Small amount of ventral epidural enhancing material may be dilated venous plexus or small epidural abscess/phlegmon. 3. Moderate T9 spinal canal stenosis. ----------------------------------------------------------------------------------------------------------------------------------------------0 5/9 echocardiogram limited  Left Ventricle: LVEF= 35 to 40%. The left ventricle has moderately decreased function. -Indeterminate diastolic  Left Atrium: moderately dilated.  Mitral Valve:  Severe mitral valve regurgitation.  Aortic Valve: regurgitation is moderate to severe.  5/9 CTA PE protocol  Moderate-sized bilateral pleural effusions, right larger than left, with adjacent atelectasis in the lower lobes. 3. Small pericardial effusion, slightly increased in size since the prior CT. 4. Enlarged main pulmonary artery suggesting pulmonary hypertension.   Consultants:  Cardiology Dr.Jayashree Ravishankar ID,   Cultures 3/27 blood positive MSSA  ---------------------------------------------------------------------------------------------------------------------------------------------------- 5/9 blood RIGHT hand NGTD 5/9 blood RIGHT arm NGTD    Antimicrobials: Anti-infectives (From admission, onward)    Start     Ordered Stop   09/19/22 1000  hydroxychloroquine (PLAQUENIL) tablet 200 mg        09/17/22 1006     09/17/22 1030  hydroxychloroquine (PLAQUENIL) tablet 200 mg       Note to Pharmacy: Patient currently takes 200 mg twice daily Monday through Friday and 200 mg once daily Saturday and Sunday.   09/17/22 0949     09/17/22 1015  ceFAZolin (ANCEF) IVPB 2g/100 mL premix        05 /09/24 1009 09/17/22 2200   09/17/22 1000  ceFAZolin (ANCEF) IVPB  Status:  Discontinued        Note to Pharmacy: Indication:  MSSA bacteremia and discitis First Dose: Yes Last Day of Therapy:  09/17/2022 Labs - Once weekly:  CBC/D, CMP, ESR and CRP Please pull PIC at completion of IV antibiotics Fax weekly lab results  promptly to [phone] Method of administration: IV Push Method of administration m   09/17/22 0949 09/17/22 1009        Intake/Output Summary (Last 24 hours) at 09/19/2022 0908 Last data filed at 09/19/2022 0602 Gross per 24 hour  Intake 120 ml  Output 250 ml  Net -130 ml     Physical Exam:  General: A/O x 4 (RN favour states waxing and waning: Hallucinating seeing/talking to people that are not room, no acute respiratory distress Eyes: negative scleral hemorrhage, negative anisocoria, negative icterus ENT: Negative Runny nose, negative gingival bleeding, Neck:  Negative scars, masses, torticollis, lymphadenopathy, JVD Lungs: Clear to auscultation bilaterally without wheezes or crackles Cardiovascular: Regular rate and rhythm without murmur gallop or rub normal S1 and S2 Abdomen: negative abdominal pain, nondistended, positive soft, bowel sounds, no rebound, no ascites, no appreciable mass Extremities: No significant cyanosis, clubbing, or edema bilateral lower extremities Skin: Negative rashes, lesions, ulcers Psychiatric:  Negative depression, negative anxiety, negative fatigue, negative mania  Central nervous system:  Cranial nerves II through XII intact, tongue/uvula midline, all extremities muscle strength 2/5, sensation intact throughout, negative dysarthria, negative expressive aphasia, negative receptive aphasia.   .   DVT prophylaxis: Apixaban Code Status:  Family Communication: 5/11 granddaughter [name] [facility]) left message on her phone outlining plan of care.      Dispo: The patient is from: SNF              Anticipated d/c is to: SNF              Anticipated d/c date is: > 3 days              Patient currently is not medically  stable to d/c.      Assessment & Plan: Covid vaccination;   Active Problems:   Acute respiratory failure with hypoxia (HCC)   Acute on chronic diastolic CHF (congestive heart failure) (HCC)   CKD (chronic kidney disease), stage IV (HCC)   Elevated troponin   Gout   MSSA bacteremia   HLD (hyperlipidemia)   Anemia due to stage 3 chronic kidney disease (HCC)   Hypothyroidism   Rheumatoid arthritis (HCC)   Acute deep vein thrombosis (DVT) of calf muscle vein of right lower extremity (HCC)   Obstructive lung disease (generalized) (HCC)   Hypokalemia   Acute Systolic CHF (current LVEF 35 to 40%) -April 2024 echocardiogram LVEF 50 to 55% -Acute respiratory failure with volume overload with noted BNP of 3300 -Reach out to cardiology for consultation -Strict in and out - - Daily weight Filed Weights   09/17/22 0525 09/19/22 0725  Weight: 68 kg 61.8 kg  -5/10 Lasix IV 60 mg BID---> decrease to 40mg  daily per cardiology recommendation  -5/10 metoprolol 25 mg BID -5/11 Lasix dose held today per cardiology recommendation.  Elevated troponin Troponins 20s to 30s in the setting of volume overload Suspect secondary heart strain EKG grossly stable Follow  Hypoalbuminemia -5/10 albumin 50 g x 1    Acute respiratory failure with hypoxia (HCC) -Decompensated respiratory status initially requiring BiPAP-now on 2 L in the setting of flash pulmonary edema and volume overload -CTA chest negative for PE, + moderate bilateral pleural effusions  and small pericardial effusion, ? Pulm HTN  -Lasix IV 60 mg BID -5/10 per cardiology    -Decrease Lasix IV 60 mg daily  -Optimize HF medication  -MR is worse than previous at mod-severe.     CKD (chronic kidney disease), stage IV (Baseline creatinine 1-1.7) -If above is truly her baseline creatinine now stage IV CKD.   -Will continue to diurese secondary to above and determine what baseline creatinine actually is. Lab Results  Component  Value Date   CREATININE 1.21 (H) 09/19/2022   CREATININE 1.09 (H) 09/18/2022   CREATININE 0.92 09/17/2022   CREATININE 1.20 (H) 09/07/2022   CREATININE 1.33 (H) 09/06/2022  -5/11 mildly elevated see CHF   Hx MSSA bacteremia -Diagnosed 3/27 has been on antibiotics since. -On IV cefazolin with today May 9 being the last day of treatment.  However will continue until obtain ID consult in a.m. given that repeat labs are pending to see if she is actually clear infection. 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  See MSSA bacteremia  Obstructive lung disease (generalized) (HCC) -Stable, SOB secondary to fluid overload. - See acute respiratory failure with hypoxia   Acute deep vein thrombosis (DVT) of calf muscle vein of right lower extremity (HCC) -Continue Eliquis   Rheumatoid arthritis (HCC) -Continue Plaquenil    Hypothyroidism -Synthroid 137 mcg daily   Anemia due to CKD stage III (baseline HgB~8) -Transfuse for hemoglobin<7  -5/11 transfuse 1 unit PRBC  HLD (hyperlipidemia) - 5/11 LDL= within CHF guidelines - 5/11 Lipitor 40 mg daily  Gout -Allopurinol 300 mg daily   Hypokalemia -Potassium goal> 4    Hypocalcemia - Calcium goal> 8.9  Goals of care - 5/11 palliative care consult: Worsening systolic CHF, as well as additional multiple medical problems consult for palliative care at home vs hospice.  Granddaughter Youth worker) requests that you set up appointment via phone or in person consult with her and grandfather being present.    Mobility Assessment (last 72 hours)     Mobility Assessment     Row Name 09/18/22 2106 09/17/22 1750         Does patient have an order for bedrest or is patient medically unstable No - Continue assessment No - Continue assessment      What is the highest level of mobility based on the  progressive mobility assessment? Level 3 (Stands with assist) - Balance while standing  and cannot march in place Level 4 (Walks with assist in room) - Balance while marching in place and cannot step forward and back - Complete      Is the above level different from baseline mobility prior to current illness? Yes - Recommend PT order No - Consider discontinuing PT/OT                    Time: 50 minutes         Care during the described time interval was provided by me .  I have reviewed this patient's available data, including medical history, events of note, physical examination, and all test results as part of my evaluation.

## 2022-09-19 NOTE — Progress Notes (Signed)
[facility] CLINIC CARDIOLOGY CONSULT NOTE       Patient ID: Sydney Gillespie MRN: [id number] DOB/AGE: June 08, 1940 82 y.o.  Admit date: 09/17/2022 Referring Physician Dr. Doree Albee  Primary Physician Dr. Marcello Fennel Primary Cardiologist Dr. Darrold Junker Reason for Consultation CHF  HPI: Sydney Gillespie "Sydney Gillespie" is an 81yoF with a PMH of multiple myeloma (dx 2012 s/p stem cell transplant, relapse 2021, ongoing tx), HFpEF, moderate MR, recent DVT (currently taking Eliquis) CKD 4, RA, palpitations who presented to [facility] ED from a rehab facility on 09/17/2022 in respiratory distress.  Cardiology is consulted for further assistance.  Interval history: -Alert and oriented this AM, sitting up in bed eating breakfast. Patient reports she is feeling well, denies CP, SOB.  -Cr elevated this AM to 1.21 < 1.09 < 0.92 (09/16/21), will hold IV Lasix today.  -Per I/O only net negative 480cc but has a reported weight loss of 6.2 kg. Appears nearly euvolemic on exam with no LE edema, minimal bibasilar crackles. Saturating well on RA at rest.   Review of systems complete and found to be negative unless listed above     Past Medical History:  Diagnosis Date   Acute respiratory failure with hypoxia (HCC) 05/07/2022   Acute respiratory failure with hypoxia (HCC) 05/07/2022   Allergic rhinitis    Allergy    Anemia    Anemia    Barrett's esophagus    Blood dyscrasia    multiple myloma remission   Cancer (HCC)    Change in bowel habits    Compression fracture 2013   Degenerative disc disease, lumbar    Dysrhythmia    palpitations   Esophageal reflux    Family history of adverse reaction to anesthesia    nausea -mom   GERD (gastroesophageal reflux disease)    Gout    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HCC)    Heart palpitations    Hyperlipidemia    Hypothyroidism    Inflammatory polyarthropathy (HCC)    Lumbago    Lumbar radiculitis    Lumbar stenosis with neurogenic claudication    Multiple myeloma (HCC)     Multiple myeloma (HCC)    Neuropathy    Bilateral Feet   Osteopenia    Osteoporosis    Other dysphagia    Palpitations    Pneumonia 04/2022   R/T COVID   Positive PPD    Renal insufficiency    Rheumatoid arthritis (HCC)    Rheumatoid arthritis (HCC)    Seasonal allergies    Tuberculosis 2011   Vertigo     Past Surgical History:  Procedure Laterality Date   ABDOMINAL HYSTERECTOMY     APPENDECTOMY     BACK SURGERY     BREAST EXCISIONAL BIOPSY Bilateral    neg   CATARACT EXTRACTION Bilateral    CHOLECYSTECTOMY     COLONOSCOPY WITH PROPOFOL N/A 11/12/2016   Procedure: COLONOSCOPY WITH PROPOFOL;  Surgeon: Scot Jun, MD;  Location: [facility] ENDOSCOPY;  Service: Endoscopy;  Laterality: N/A;   ESOPHAGOGASTRODUODENOSCOPY (EGD) WITH PROPOFOL N/A 11/12/2016   Procedure: ESOPHAGOGASTRODUODENOSCOPY (EGD) WITH PROPOFOL;  Surgeon: Scot Jun, MD;  Location: [facility] ENDOSCOPY;  Service: Endoscopy;  Laterality: N/A;   ESOPHAGOGASTRODUODENOSCOPY (EGD) WITH PROPOFOL N/A 07/17/2022   Procedure: ESOPHAGOGASTRODUODENOSCOPY (EGD) WITH PROPOFOL;  Surgeon: Regis Bill, MD;  Location: [facility] ENDOSCOPY;  Service: Endoscopy;  Laterality: N/A;   FOOT SURGERY     KYPHOPLASTY N/A 07/29/2022   Procedure: KYPHOPLASTY THORACIC EIGHT, THORACIC NINE, THORACIC TEN;  Surgeon: Tressie Stalker,  MD;  Location: [facility] OR;  Service: Neurosurgery;  Laterality: N/A;  3C   LUMBAR LAMINECTOMY/DECOMPRESSION MICRODISCECTOMY Left 06/18/2016   Procedure: LAMINECTOMY FOR FACET/SYNOVIAL CYST LUMBAR THREE - LUMBAR FOUR LEFT;  Surgeon: Tressie Stalker, MD;  Location: [facility] OR;  Service: Neurosurgery;  Laterality: Left;  LAMINECTOMY FOR FACET/SYNOVIAL CYST LUMBAR THREE - LUMBAR FOUR LEFT    Medications Prior to Admission  Medication Sig Dispense Refill Last Dose   acetaminophen (TYLENOL) 325 MG tablet Take 2 tablets (650 mg total) by mouth every 6 (six) hours as needed for mild pain (or Fever >/= 101).   unk    allopurinol (ZYLOPRIM) 300 MG tablet Take 300 mg by mouth daily.   09/16/2022 at 0815   ALPRAZolam (XANAX) 0.25 MG tablet Take 1 tablet (0.25 mg total) by mouth 2 (two) times daily as needed. Once daily for anxiety or sleep (Patient taking differently: Take 0.25 mg by mouth 3 (three) times daily as needed for sleep or anxiety. Once daily for anxiety or sleep) 10 tablet 0 09/16/2022 at 1935   Ascorbic Acid (VITAMIN C) 1000 MG tablet Take 1,000 mg by mouth daily.   09/16/2022 at 0815   CALCIUM MAGNESIUM ZINC PO Take 1 tablet by mouth 2 (two) times daily. 1 am, 2 hs   09/16/2022 at 1934   ceFAZolin (ANCEF) IVPB Inject 2 g into the vein every 12 (twelve) hours for 13 days. Indication:  MSSA bacteremia and discitis First Dose: Yes Last Day of Therapy:  09/17/2022 Labs - Once weekly:  CBC/D, CMP, ESR and CRP Please pull PIC at completion of IV antibiotics Fax weekly lab results  promptly to [phone] Method of administration: IV Push Method of administration may be changed at the discretion of home infusion pharmacist based upon assessment of the patient and/or caregiver's ability to self-administer the medication ordered. 26 Units 0 09/16/2022 at 2112   Cholecalciferol (VITAMIN D3) 125 MCG (5000 UT) TABS Take 1 tablet by mouth daily.   09/16/2022 at 0958   docusate sodium (COLACE) 100 MG capsule Take 1 capsule (100 mg total) by mouth 2 (two) times daily. 30 capsule 0 09/16/2022 at 1805   ELIQUIS 5 MG TABS tablet TAKE 1 TABLET(5 MG) BY MOUTH TWICE DAILY (Patient taking differently: Take 5 mg by mouth 2 (two) times daily.) 60 tablet 1 09/16/2022 at 1934   furosemide (LASIX) 20 MG tablet Take 10 mg by mouth daily.   09/15/2022 at 0814   Heparin Sod, Pork, Lock Flush (HEPARIN LOCK FLUSH IV) Inject 5 mLs into the vein 2 (two) times daily.   09/16/2022 at 2112   hydroxychloroquine (PLAQUENIL) 200 MG tablet Take 200 mg by mouth See admin instructions. Patient currently takes 200 mg twice daily Monday through Friday and 200 mg  once daily Saturday and Sunday.   09/16/2022 at 1934   Ipratropium-Albuterol (COMBIVENT) 20-100 MCG/ACT AERS respimat Inhale 1 puff into the lungs every 6 (six) hours as needed for wheezing. 4 g 0 unk   iron polysaccharides (NIFEREX) 150 MG capsule Take 1 capsule (150 mg total) by mouth daily. 30 capsule 1 09/16/2022 at 0958   levothyroxine (SYNTHROID) 137 MCG tablet Take 1 tablet (137 mcg total) by mouth daily at 6 (six) AM. 30 tablet 0 09/16/2022 at 0603   midodrine (PROAMATINE) 5 MG tablet Take 1 tablet (5 mg total) by mouth 3 (three) times daily with meals. 90 tablet 0 09/16/2022 at 0958   Multiple Vitamin (MULTIVITAMIN WITH MINERALS) TABS tablet Take 1 tablet by  mouth daily. 30 tablet 0 09/16/2022 at 0815   nystatin-triamcinolone (MYCOLOG II) cream Apply 1 Application topically 2 (two) times daily as needed (itching).   unk   OLANZapine (ZYPREXA) 10 MG tablet Take 1 tablet (10 mg total) by mouth at bedtime. 30 tablet 0 09/16/2022 at 2112   polyethylene glycol (MIRALAX / GLYCOLAX) 17 g packet Take 17 g by mouth daily. 30 each 0 09/16/2022 at 0815   Potassium 99 MG TABS Take 1 tablet by mouth daily.   09/16/2022 at 0815   rOPINIRole (REQUIP) 0.25 MG tablet Take 1 tablet (0.25 mg total) by mouth at bedtime. (Patient taking differently: Take 0.5 mg by mouth at bedtime.) 30 tablet 0 09/16/2022 at 1934   senna-docusate (SENOKOT-S) 8.6-50 MG tablet Take 1 tablet by mouth 2 (two) times daily. Hold if having loose or frequent stools. 30 tablet 0 09/16/2022 at 1805   sertraline (ZOLOFT) 50 MG tablet Take 1 tablet (50 mg total) by mouth daily. 30 tablet 3 09/16/2022 at 0815   vitamin B-12 (CYANOCOBALAMIN) 1000 MCG tablet Take 1,000 mcg by mouth daily.   09/16/2022 at 0815   zinc gluconate 50 MG tablet Take 50 mg by mouth daily.   09/16/2022 at 0958   feeding supplement (ENSURE ENLIVE / ENSURE PLUS) LIQD Take 237 mLs by mouth 3 (three) times daily between meals. 21330 mL 0    sodium chloride flush (NS) 0.9 % SOLN Inject 3 mLs into  the vein every 12 (twelve) hours. 100 mL 0    Spacer/Aero-Holding Chambers DEVI 1 each by Does not apply route as needed. 1 each 0     Social History   Socioeconomic History   Marital status: Widowed    Spouse name: Not on file   Number of children: Not on file   Years of education: Not on file   Highest education level: Not on file  Occupational History   Not on file  Tobacco Use   Smoking status: Never   Smokeless tobacco: Never  Vaping Use   Vaping Use: Never used  Substance and Sexual Activity   Alcohol use: No   Drug use: No   Sexual activity: Yes  Other Topics Concern   Not on file  Social History Narrative   Not on file   Social Determinants of Health   Financial Resource Strain: Not on file  Food Insecurity: No Food Insecurity (09/04/2022)   Hunger Vital Sign    Worried About Running Out of Food in the Last Year: Never true    Ran Out of Food in the Last Year: Never true  Transportation Needs: No Transportation Needs (08/05/2022)   PRAPARE - Administrator, Civil Service (Medical): No    Lack of Transportation (Non-Medical): No  Physical Activity: Not on file  Stress: Not on file  Social Connections: Not on file  Intimate Partner Violence: Not At Risk (08/05/2022)   Humiliation, Afraid, Rape, and Kick questionnaire    Fear of Current or Ex-Partner: No    Emotionally Abused: No    Physically Abused: No    Sexually Abused: No    Family History  Problem Relation Age of Onset   Hypertension Mother    Heart attack Mother    Rheum arthritis Mother    Osteoarthritis Mother    Cancer Father    Hypertension Father    Stroke Father    Breast cancer Maternal Grandmother 40   Osteoarthritis Other    Rheum arthritis Other  Intake/Output Summary (Last 24 hours) at 09/19/2022 1051 Last data filed at 09/19/2022 1030 Gross per 24 hour  Intake 360 ml  Output --  Net 360 ml    Vitals:   09/18/22 2106 09/18/22 2308 09/19/22 0725 09/19/22 0916   BP: (!) 140/84 130/88  (!) 143/84  Pulse: (!) 107 (!) 103  (!) 101  Resp:  16  18  Temp:  98.6 F (37 C)  98.4 F (36.9 C)  TempSrc:    Oral  SpO2:  98%  93%  Weight:   61.8 kg   Height:        PHYSICAL EXAM General: pleasant elderly Caucasian female, sitting upright in bed with granddaughter present.  HEENT:  Normocephalic and atraumatic. Neck:  No JVD.  Lungs: Normal respiratory effort on oxygen by nasal cannula.  Mild bibasilar crackles bilaterally.   Heart: Fast rate, regular rhythm. Normal S1 and S2, 3/6 holosystolic murmur best heard at the apex. Abdomen: Non-distended appearing.  Msk: Normal strength and tone for age. Extremities: Warm and well perfused. No clubbing, cyanosis. Trace bilateral lower extremity edema.  Neuro: Alert but disoriented to time and situation Psych:  Answers questions appropriately.   Labs: Basic Metabolic Panel: Recent Labs    09/18/22 0452 09/18/22 0928 09/19/22 0845  NA 142  --  141  K 4.5  --  3.8  CL 107  --  107  CO2 27  --  24  GLUCOSE 93  --  79  BUN 19  --  24*  CREATININE 1.09*  --  1.21*  CALCIUM 8.0*  --  8.1*  MG  --  1.9 1.7  PHOS  --  2.9 2.7   Liver Function Tests: Recent Labs    09/18/22 0452 09/19/22 0845  AST 29 117*  ALT <5 8  ALKPHOS 112 231*  BILITOT 0.5 0.8  PROT 5.7* 5.8*  ALBUMIN 2.6* 3.0*   No results for input(s): "LIPASE", "AMYLASE" in the last 72 hours. CBC: Recent Labs    09/19/22 0845 09/19/22 0930  WBC 7.8 8.3  NEUTROABS 5.9 6.1  HGB 7.0* 7.2*  HCT 22.2* 22.9*  MCV 96.5 95.8  PLT 206 209   Cardiac Enzymes: Recent Labs    09/17/22 0534 09/17/22 0751  TROPONINIHS 26* 31*   BNP: Recent Labs    09/17/22 0534  BNP 3,282.1*   D-Dimer: No results for input(s): "DDIMER" in the last 72 hours. Hemoglobin A1C: No results for input(s): "HGBA1C" in the last 72 hours. Fasting Lipid Panel: Recent Labs    09/19/22 0845  CHOL 89  HDL 53  LDLCALC 23  TRIG 67  CHOLHDL 1.7    Thyroid Function Tests: No results for input(s): "TSH", "T4TOTAL", "T3FREE", "THYROIDAB" in the last 72 hours.  Invalid input(s): "FREET3" Anemia Panel: No results for input(s): "VITAMINB12", "FOLATE", "FERRITIN", "TIBC", "IRON", "RETICCTPCT" in the last 72 hours.   Radiology: ECHOCARDIOGRAM LIMITED  Result Date: 09/17/2022    ECHOCARDIOGRAM LIMITED REPORT   Patient Name:   MAURISA WESTGATE Date of Exam: 09/17/2022 Medical Rec #:  [id number]          Height:       65.0 in Accession #:    [id number]         Weight:       150.0 lb Date of Birth:  Nov 04, 1940           BSA:          1.750 m Patient Age:  82 years           BP:           147/90 mmHg Patient Gender: F                  HR:           105 bpm. Exam Location:  [facility] Procedure: Limited Echo and Limited Color Doppler Indications:     CHF  History:         Patient has prior history of Echocardiogram examinations, most                  recent 09/01/2022. CHF, Signs/Symptoms:Bacteremia and Syncope;                  Risk Factors:Dyslipidemia. DVT, CKD.  Sonographer:     Mikki Harbor Referring Phys:  [id number] [facility] MICHELLE TANG Diagnosing Phys: Marcina Millard MD IMPRESSIONS  1. Left ventricular ejection fraction, by estimation, is 35 to 40%. The left ventricle has moderately decreased function. The left ventricle has no regional wall motion abnormalities. The left ventricular internal cavity size was mildly dilated. Indeterminate diastolic filling due to E-A fusion.  2. Right ventricular systolic function is normal. The right ventricular size is normal.  3. Left atrial size was moderately dilated.  4. The mitral valve is normal in structure. Severe mitral valve regurgitation. No evidence of mitral stenosis.  5. The aortic valve is normal in structure. Aortic valve regurgitation is moderate to severe. No aortic stenosis is present.  6. The inferior vena cava is normal in size with greater than 50% respiratory variability, suggesting right atrial  pressure of 3 mmHg. FINDINGS  Left Ventricle: Left ventricular ejection fraction, by estimation, is 35 to 40%. The left ventricle has moderately decreased function. The left ventricle has no regional wall motion abnormalities. The left ventricular internal cavity size was mildly dilated. There is no left ventricular hypertrophy. Indeterminate diastolic filling due to E-A fusion. Right Ventricle: The right ventricular size is normal. No increase in right ventricular wall thickness. Right ventricular systolic function is normal. Left Atrium: Left atrial size was moderately dilated. Right Atrium: Right atrial size was normal in size. Pericardium: There is no evidence of pericardial effusion. Mitral Valve: The mitral valve is normal in structure. Severe mitral valve regurgitation. No evidence of mitral valve stenosis. MV peak gradient, 14.3 mmHg. The mean mitral valve gradient is 5.0 mmHg. Tricuspid Valve: The tricuspid valve is normal in structure. Tricuspid valve regurgitation is not demonstrated. No evidence of tricuspid stenosis. Aortic Valve: The aortic valve is normal in structure. Aortic valve regurgitation is moderate to severe. No aortic stenosis is present. Pulmonic Valve: The pulmonic valve was normal in structure. Pulmonic valve regurgitation is not visualized. No evidence of pulmonic stenosis. Aorta: The aortic root is normal in size and structure. Venous: The inferior vena cava is normal in size with greater than 50% respiratory variability, suggesting right atrial pressure of 3 mmHg. IAS/Shunts: No atrial level shunt detected by color flow Doppler. LEFT VENTRICLE PLAX 2D LVIDd:         5.00 cm LVIDs:         3.70 cm LV PW:         0.80 cm LV IVS:        0.70 cm LVOT diam:     2.00 cm LVOT Area:     3.14 cm  LV Volumes (MOD) LV vol d, MOD A2C: 99.0 ml LV vol d, MOD  A4C: 80.2 ml LV vol s, MOD A2C: 52.2 ml LV vol s, MOD A4C: 55.4 ml LV SV MOD A2C:     46.8 ml LV SV MOD A4C:     80.2 ml LV SV MOD BP:       35.7 ml LEFT ATRIUM         Index LA diam:    4.60 cm 2.63 cm/m   AORTA Ao Root diam: 3.40 cm MITRAL VALVE MV Area (PHT): 9.73 cm       SHUNTS MV Peak grad:  14.3 mmHg      Systemic Diam: 2.00 cm MV Mean grad:  5.0 mmHg MV Vmax:       1.89 m/s MV Vmean:      98.4 cm/s MV Decel Time: 78 msec MR Peak grad:    136.0 mmHg MR Mean grad:    84.0 mmHg MR Vmax:         583.00 cm/s MR Vmean:        424.0 cm/s MR PISA:         1.57 cm MR PISA Eff ROA: 25 mm MR PISA Radius:  0.50 cm MV E velocity: 160.00 cm/s Marcina Millard MD Electronically signed by Marcina Millard MD Signature Date/Time: 09/17/2022/5:26:11 PM    Final    CT Angio Chest PE W and/or Wo Contrast  Result Date: 09/17/2022 CLINICAL DATA:  Respiratory distress. EXAM: CT ANGIOGRAPHY CHEST WITH CONTRAST TECHNIQUE: Multidetector CT imaging of the chest was performed using the standard protocol during bolus administration of intravenous contrast. Multiplanar CT image reconstructions and MIPs were obtained to evaluate the vascular anatomy. RADIATION DOSE REDUCTION: This exam was performed according to the departmental dose-optimization program which includes automated exposure control, adjustment of the mA and/or kV according to patient size and/or use of iterative reconstruction technique. CONTRAST:  40mL OMNIPAQUE IOHEXOL 350 MG/ML SOLN COMPARISON:  Chest radiograph 1 day prior.  CTA chest 08/05/2022. FINDINGS: Cardiovascular: There is adequate opacification of the pulmonary arteries to the segmental level. There is no evidence of pulmonary embolism. The main pulmonary artery is enlarged measuring up to 3.3 cm. There is mild calcified plaque in the nonaneurysmal thoracic aorta. The heart size is stable. There is a small pericardial effusion, slightly increased in size since the prior CT. A right upper extremity PICC is in place. The tip is obscured by contrast. Mediastinum/Nodes: The thyroid is unremarkable. The esophagus is grossly unremarkable. There  is no mediastinal, hilar, or axillary lymphadenopathy. Lungs/Pleura: The trachea and central airways are patent. There are moderate-sized bilateral pleural effusions, right larger than left, with adjacent atelectasis in the lower lobes. There is additional probable atelectasis in the lingula. There is no convincing interlobular septal thickening. There is no pneumothorax. Upper Abdomen: Cholecystectomy clips are noted. The imaged portions of the upper abdominal viscera are otherwise unremarkable. Musculoskeletal: There is no acute osseous abnormality or suspicious osseous lesion. The patient is status post vertebral augmentation at T8 through T10 with near-complete collapse of the T9 vertebral body. The appearance is unchanged. Mild compression deformity of the T12 vertebral body is unchanged. Review of the MIP images confirms the above findings. IMPRESSION: 1. No evidence of pulmonary embolism. 2. Moderate-sized bilateral pleural effusions, right larger than left, with adjacent atelectasis in the lower lobes. 3. Small pericardial effusion, slightly increased in size since the prior CT. 4. Enlarged main pulmonary artery suggesting pulmonary hypertension. Electronically Signed   By: Lesia Hausen M.D.   On: 09/17/2022 08:45   DG  Chest Portable 1 View  Result Date: 09/17/2022 CLINICAL DATA:  Respiratory distress. EXAM: PORTABLE CHEST 1 VIEW COMPARISON:  09/02/2022 FINDINGS: Diffuse interstitial and hazy airspace opacity. Cardiomegaly. Small pleural effusions are likely. Right PICC with tip at the upper SVC. No pneumothorax. IMPRESSION: 1. Diffuse pulmonary opacity which could be edema or infection. 2. Low volume chest with chronic cardiomegaly. Electronically Signed   By: Tiburcio Pea M.D.   On: 09/17/2022 06:01   MR BRAIN WO CONTRAST  Result Date: 09/03/2022 CLINICAL DATA:  Initial evaluation for mental status change, unknown cause. EXAM: MRI HEAD WITHOUT CONTRAST TECHNIQUE: Multiplanar, multiecho pulse  sequences of the brain and surrounding structures were obtained without intravenous contrast. COMPARISON:  Prior CT from 09/01/2022. FINDINGS: Brain: Cerebral volume within normal limits for age. Patchy T2/FLAIR hyperintensity involving the periventricular deep white matter both cerebral hemispheres, consistent with chronic small vessel ischemic disease, mild for age. No evidence for acute or subacute infarct. Gray-white matter differentiation maintained. No areas of chronic cortical infarction. No acute or chronic intracranial blood products. No mass lesion, midline shift or mass effect. No hydrocephalus or extra-axial fluid collection. Pituitary gland and suprasellar region within normal limits. Vascular: Major intracranial vascular flow voids are maintained. Mild torcular inversion noted. Skull and upper cervical spine: Craniocervical junction within normal limits. Bone marrow signal intensity normal. No scalp soft tissue abnormality. Sinuses/Orbits: Prior bilateral ocular lens replacement. Paranasal sinuses are largely clear. No significant mastoid effusion. Other: None. IMPRESSION: 1. No acute intracranial abnormality. 2. Mild chronic microvascular ischemic disease for age. Electronically Signed   By: Rise Mu M.D.   On: 09/03/2022 01:52   ECHOCARDIOGRAM LIMITED  Result Date: 09/02/2022    ECHOCARDIOGRAM LIMITED REPORT   Patient Name:   CLARSIE WALEGA Date of Exam: 09/01/2022 Medical Rec #:  [id number]          Height:       65.0 in Accession #:    [id number]         Weight:       121.3 lb Date of Birth:  1940-09-05           BSA:          1.599 m Patient Age:    82 years           BP:           97/48 mmHg Patient Gender: F                  HR:           64 bpm. Exam Location:  [facility] Procedure: 2D Echo, Cardiac Doppler, Limited Echo and Limited Color Doppler Indications:     R55 Syncope  History:         Patient has prior history of Echocardiogram examinations, most                  recent  08/09/2022. Arrythmias:Palpitations; Risk                  Factors:Dyslipidemia.  Sonographer:     Daphine Deutscher RDCS Referring Phys:  [id number] Verdene Lennert Diagnosing Phys: Alwyn Pea MD IMPRESSIONS  1. Left ventricular ejection fraction, by estimation, is 50 to 55%. The left ventricle has low normal function. The left ventricle demonstrates regional wall motion abnormalities (see scoring diagram/findings for description). Left ventricular diastolic  parameters were normal.  2. Right ventricular systolic function is normal. The right ventricular size is normal.  3. Left  atrial size was mildly dilated.  4. A small pericardial effusion is present.  5. The mitral valve is normal in structure. Trivial mitral valve regurgitation.  6. The aortic valve is calcified. Aortic valve regurgitation is mild to moderate. Aortic valve sclerosis/calcification is present, without any evidence of aortic stenosis. FINDINGS  Left Ventricle: Left ventricular ejection fraction, by estimation, is 50 to 55%. The left ventricle has low normal function. The left ventricle demonstrates regional wall motion abnormalities. The left ventricular internal cavity size was normal in size. There is no left ventricular hypertrophy. Left ventricular diastolic parameters were normal. Right Ventricle: The right ventricular size is normal. No increase in right ventricular wall thickness. Right ventricular systolic function is normal. Left Atrium: Left atrial size was mildly dilated. Right Atrium: Right atrial size was not assessed. Pericardium: A small pericardial effusion is present. Mitral Valve: The mitral valve is normal in structure. Trivial mitral valve regurgitation. Tricuspid Valve: The tricuspid valve is normal in structure. Tricuspid valve regurgitation is mild. Aortic Valve: The aortic valve is calcified. Aortic valve regurgitation is mild to moderate. Aortic regurgitation PHT measures 572 msec. Aortic valve  sclerosis/calcification is present, without any evidence of aortic stenosis. Pulmonic Valve: The pulmonic valve was normal in structure. Pulmonic valve regurgitation is not visualized. Aorta: The ascending aorta was not well visualized. IAS/Shunts: The interatrial septum was not well visualized. Additional Comments: There is no pleural effusion.  LEFT VENTRICLE PLAX 2D LVIDd:         4.10 cm Diastology LVIDs:         3.00 cm LV e' medial:    6.14 cm/s LV PW:         1.00 cm LV E/e' medial:  10.7 LV IVS:        1.00 cm LV e' lateral:   8.22 cm/s                        LV E/e' lateral: 8.0  RIGHT VENTRICLE             IVC RV S prime:     13.40 cm/s  IVC diam: 1.10 cm TAPSE (M-mode): 1.8 cm LEFT ATRIUM         Index LA diam:    4.60 cm 2.88 cm/m  AORTIC VALVE AI PHT:      572 msec  AORTA Ao Root diam: 3.40 cm MITRAL VALVE               TRICUSPID VALVE MV Area (PHT): 2.95 cm    TR Peak grad:   14.7 mmHg MV Decel Time: 257 msec    TR Vmax:        192.00 cm/s MV E velocity: 65.50 cm/s MV A velocity: 90.70 cm/s MV E/A ratio:  0.72 Dwayne D Callwood MD Electronically signed by Alwyn Pea MD Signature Date/Time: 09/02/2022/7:27:49 AM    Final    DG Chest Port 1 View  Result Date: 09/02/2022 CLINICAL DATA:  PICC placement EXAM: PORTABLE CHEST 1 VIEW COMPARISON:  Yesterday FINDINGS: Right upper extremity PICC with tip at the SVC. Cardiomegaly. Streaky opacity in the bilateral lungs. No visible effusion or pneumothorax. Chronic cardiomegaly. Multilevel cement augmentation in the thoracic spine. Gas distended upper abdominal colon. IMPRESSION: 1. Right upper extremity PICC with tip at the SVC. 2. Cardiomegaly and borderline vascular congestion. Atelectatic type opacity in the bilateral lungs. Electronically Signed   By: Tiburcio Pea M.D.   On: 09/02/2022 04:34  US RENAL  Result Date: 09/01/2022 CLINICAL DATA:  [id number] AKI (acute kidney injury) [id number] EXAM: RENAL / URINARY TRACT ULTRASOUND COMPLETE COMPARISON:   11/04/2017 FINDINGS: Right Kidney: Renal measurements: 9.6 x 4.5 x 4.6 cm = volume: 102 mL. Parenchyma echogenic compared to adjacent liver. No focal lesion or hydronephrosis. Left Kidney: Renal measurements: 8.2 x 4.2 x 3.7 cm = volume: 67 mL. No hydronephrosis. Exophytic 3.1 x 2.7 x 2.9 cm cyst from lower pole as before. Bladder: Incompletely distended.  Bilateral ureteral jets demonstrated. Other: Bilateral pleural effusions incidentally noted. IMPRESSION: 1. No hydronephrosis. 2. Echogenic renal parenchyma suggesting medical renal disease. 3. 3.1 cm left renal cyst. 4. Bilateral pleural effusions. Electronically Signed   By: Corlis Leak M.D.   On: 09/01/2022 18:35   DG Chest Portable 1 View  Result Date: 09/01/2022 CLINICAL DATA:  Recent pneumonia EXAM: PORTABLE CHEST 1 VIEW COMPARISON:  08/07/2022 FINDINGS: Previously noted bibasilar and right lung opacities are improved, with some residual opacities in the mid to lower right lung and lower left lung, which are somewhat linear and may reflect atelectasis or infection. Unchanged cardiac and mediastinal contours. Aortic atherosclerosis. No pleural effusion or pneumothorax. No acute osseous abnormality. Prior ACDF. IMPRESSION: Previously noted bibasilar and right lung opacities are improved, with some residual opacities in the mid to lower right lung and lower left lung, which may reflect atelectasis and/or infection. Electronically Signed   By: Wiliam Ke M.D.   On: 09/01/2022 15:26   CT HEAD WO CONTRAST ( )  Result Date: 09/01/2022 CLINICAL DATA:  Trauma EXAM: CT HEAD WITHOUT CONTRAST TECHNIQUE: Contiguous axial images were obtained from the base of the skull through the vertex without intravenous contrast. RADIATION DOSE REDUCTION: This exam was performed according to the departmental dose-optimization program which includes automated exposure control, adjustment of the mA and/or kV according to patient size and/or use of iterative reconstruction  technique. COMPARISON:  None Available. FINDINGS: Brain: No evidence of acute infarction, hemorrhage, hydrocephalus, extra-axial collection or mass lesion/mass effect. Sequela of moderate chronic microvascular ischemic change. Vascular: No hyperdense vessel or unexpected calcification. Skull: Normal. Negative for fracture or focal lesion. Sinuses/Orbits: No mastoid or middle ear effusion. Paranasal sinuses are notable for frothy secretions in the bilateral sphenoid sinuses, which can be seen in the setting sinusitis. Bilateral lens replacement. Orbits are otherwise unremarkable. Other: None. IMPRESSION: 1. No acute intracranial abnormality. 2. Frothy secretions in the bilateral sphenoid sinuses can be seen in the setting of acute sinusitis. Electronically Signed   By: Lorenza Cambridge M.D.   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)   Hypothyroidism   Rheumatoid arthritis (HCC)   CKD (chronic kidney disease), stage IV (HCC)   Elevated troponin   Acute deep vein thrombosis (DVT) of calf muscle vein of right lower extremity (HCC)   MSSA bacteremia   Acute respiratory failure with hypoxia (HCC)   Acute on chronic diastolic CHF (congestive heart failure) (HCC)   Obstructive lung disease (generalized) (HCC)   Hypokalemia    ASSESSMENT AND PLAN:  Sydney Gillespie "Sydney Gillespie" is an 81yoF with a PMH of multiple myeloma (dx  2012 s/p stem cell transplant, relapse 2021, ongoing tx), HFpEF, moderate MR, recent DVT (currently taking Eliquis) CKD 4, RA, palpitations who presented to [facility] ED from a rehab facility on 09/17/2022 in respiratory distress.  Cardiology is  consulted for further assistance.  # Delirium Per granddaughter, she has a history of confusion during her previous hospitalizations.  Suspect hospital delirium, neuroexam is nonfocal.  Recommend frequent reorientation and minimal disturbances in the evening -Alert and oriented on exam today.   # Acute on chronic HFrEF # Moderate-severe mitral regurgitation BNP markedly elevated at 3200 with pleural effusions bilaterally on chest x-ray and CTA chest.  Clinically hypervolemic on exam with crackles, JVD, with significant peripheral edema, clinical improvement after initial IV diuresis.  Suspect some decompensation in part from stopping her diuretic within the past 10 days and some continued reduction in her EF with multiple severe illnesses requiring hospitalization over the past couple months.  Her MR is moderate to severe by echo this admission. -Received diuresis with IV Lasix 60 mg x 1 yesterday, will hold today as Cr has increased to 1.21 from 1.09 yesterday. Plan to reassess tomorrow pending kidney function. Clinically volume status appears significantly improved.  -Likely change previous home thiazide diuretic to loop diuretic at discharge -Will give mag this AM. Monitor and replenish electrolytes for goal K >4, mag >2 -Metoprolol tartrate 25 mg tid rather than her previous home medication atenolol -Further GDMT as BP and renal function tolerate with ARB/ARNI, MRA, SGLT2i -Will need close clinic follow-up outpatient for further surveillance of her MR  # Sinus tachycardia  In the setting of respiratory distress and hypoxia, also possible rebound tachycardia after stopping atenolol abruptly after her last hospital discharge.  Will start metoprolol as above, and continue Eliquis 5 mg twice daily as treatment of her recent DVT as below.  repeat EKG reviewed by myself and Dr. Darrold Junker demonstrate sinus tachycardia - Continue telemetry monitoring. Patient HR maintaining in the low 100s this AM.  Continue Metoprolol 25 mg tid, will plan to consolidate this dose tomorrow. -Monitor and replenish electrolytes as above  # Recent MSSA bacteremia PICC line present, antibiotics per primary/infectious disease  # History of DVT Eliquis 5 mg twice daily as above  # Demand ischemia Borderline elevated and flat trending, in the absence of ischemic EKG changes and in the setting of acute on chronic CHF, this is most consistent with demand/supply mismatch and not ACS   This patient's plan of care was discussed and created with Dr. Darrold Junker and he is in agreement.  Signed: Gale Journey , PA-C 09/19/2022, 10:51 AM [facility]

## 2022-09-19 NOTE — Consult Note (Signed)
Consultation Note Date: 09/19/2022   Patient Name: Sydney Gillespie  DOB: February 10, 1941  MRN: [id number]  Age / Sex: 82 y.o., female  PCP: Barbette Reichmann, MD Referring Physician: Drema Dallas, MD  Reason for Consultation: Establishing goals of care   HPI/Brief Hospital Course: 82 y.o. female  with past medical history of recent MSSA bacteremia remains on Cefazolin, multiple myeloma, CKD stage III, obstructive lung disease, HTN, HLD, RA, history of DVT on apixaban admitted from [facility] on 09/17/2022 with acute respiratory failure with hypoxia and volume overload.  Noted recent admission 4/23-4/29 for acute on chronic kidney disease  Granddaughter reports increase in BLE edema since discharge on 4/29 with acute complaints of shortness of breath/difficulty breathing early morning of admission  Sydney Gillespie is familiar to PMT services as we followed her through most recent admission as noted above   Palliative medicine was consulted for assisting with goals of care conversations.  Subjective:  Extensive chart review has been completed prior to meeting patient including labs, vital signs, imaging, progress notes, orders, and available advanced directive documents from current and previous encounters.  Introduced myself as a Publishing rights manager as a member of the palliative care team. Explained palliative medicine is specialized medical care for people living with serious illness. It focuses on providing relief from the symptoms and stress of a serious illness. The goal is to improve quality of life for both the patient and the family.   Visited with Sydney Gillespie at her bedside. Awake and alert, pleasantly confused, unable to answer orientation questions and unable to participate in GOC conversations. Granddaughter-[name] at bedside as well as [name]'s father-Sydney Gillespie's son. [name] is HCPOA.  [name] becomes emotional during visit and asks to  step outside of room. [name] shared her struggles with being able to visit and communicate with Sydney Gillespie in her current state of confusion. [name] shares she becomes easily frustrated and struggles knowing this isn't her grandmother when Sydney Gillespie is talking off base. [name] shares her understanding of the severity of her grandmothers condition but she is struggling more with acceptance of Sydney Gillespie ongoing decline. [name] shares Mother's Day is also a struggle as she is still grieving the loss of her mother. [name] also shares her struggles with caregiver burden. Emotional support and empathetic silence provided. Recommended she spend a few hours away from the hospital focusing on herself. Also recommended she seek grief counseling if she feels this would be an appropriate fit for her.  [name] shares she is contact with her 2 brothers that both live in [place] with their families. Majority of family attempting to plan a trip to visit with Sydney Gillespie.  [name] remains hopeful for ongoing and meaningful recovery. Needed time for outcomes. Answered and addressed all questions and concerns. On return to room, Sydney Gillespie remains confused, requesting to be left alone. Encouraged to [facility] to contact PMT phone for any needs as they arise, will plan to visit again tomorrow.  I discussed importance of continued conversations with family/support persons and all members of their medical team regarding overall plan of care and treatment options ensuring decisions are in alignment with patients goals of care.  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    Appearance: She is ill-appearing.  Pulmonary:     Effort: Pulmonary effort is normal. No respiratory distress.  Skin:    General: Skin is warm and dry.     Findings: Bruising present.  Neurological:     Mental Status: She is alert. She is disoriented.     Motor: Weakness present.     Vital Signs: BP 124/85 (BP Location: Left Arm)   Pulse 93   Temp 98.4 F (36.9 C) (Oral)   Resp 20   Ht 5\' 5"  (1.651 m)   Wt 61.8 kg   SpO2 93%   BMI 22.67 kg/m  Pain Scale: 0-10   Pain Score: 0-No pain  IO: Intake/output summary:  Intake/Output Summary (Last 24 hours) at 09/19/2022 1656 Last data filed at 09/19/2022 1030 Gross per 24 hour  Intake 360 ml  Output --  Net 360 ml    LBM: Last BM Date : 09/18/22 Baseline Weight: Weight: 68 kg Most recent weight: Weight: 61.8 kg      Assessment and Plan  SUMMARY OF RECOMMENDATIONS   DNR Time for outcomes with ongoing need for GOC PMT to continue to follow for ongoing needs and support  Discussed With: Nursing staff   Thank you for this consult and allowing Palliative Medicine to participate in the care of Sydney Gillespie "Sydney Gillespie." Palliative medicine will continue to follow and assist as needed.   Time Total: 90 minutes  Time spent includes: Detailed review of medical records (labs, imaging, vital signs), medically appropriate exam (mental status, respiratory, cardiac, skin), discussed with treatment team, counseling and educating patient, family and staff, documenting clinical information, medication management and coordination of care.   Signed by: [name], DNP, AGNP-C Palliative Medicine    Please contact Palliative Medicine Team phone at [phone] for questions and concerns.  For individual provider: See Loretha Stapler

## 2022-09-19 NOTE — Progress Notes (Signed)
While changing patient's pure wick, this RN noticed patient had an area of light bleeding on lower left leg. Adhesive foam bandage applied.

## 2022-09-20 DIAGNOSIS — F05 Delirium due to known physiological condition: Secondary | ICD-10-CM | POA: Diagnosis not present

## 2022-09-20 DIAGNOSIS — Z7189 Other specified counseling: Secondary | ICD-10-CM | POA: Diagnosis not present

## 2022-09-20 DIAGNOSIS — N184 Chronic kidney disease, stage 4 (severe): Secondary | ICD-10-CM | POA: Diagnosis not present

## 2022-09-20 DIAGNOSIS — I5033 Acute on chronic diastolic (congestive) heart failure: Secondary | ICD-10-CM | POA: Diagnosis not present

## 2022-09-20 DIAGNOSIS — N183 Chronic kidney disease, stage 3 unspecified: Secondary | ICD-10-CM | POA: Diagnosis not present

## 2022-09-20 DIAGNOSIS — J9601 Acute respiratory failure with hypoxia: Secondary | ICD-10-CM | POA: Diagnosis not present

## 2022-09-20 DIAGNOSIS — G4700 Insomnia, unspecified: Secondary | ICD-10-CM

## 2022-09-20 DIAGNOSIS — I5023 Acute on chronic systolic (congestive) heart failure: Secondary | ICD-10-CM | POA: Diagnosis not present

## 2022-09-20 DIAGNOSIS — F0390 Unspecified dementia without behavioral disturbance: Secondary | ICD-10-CM | POA: Diagnosis present

## 2022-09-20 LAB — CBC WITH DIFFERENTIAL/PLATELET
Abs Immature Granulocytes: 0.09 10*3/uL — ABNORMAL HIGH (ref 0.00–0.07)
Basophils Absolute: 0 10*3/uL (ref 0.0–0.1)
Basophils Relative: 1 %
Eosinophils Absolute: 0 10*3/uL (ref 0.0–0.5)
Eosinophils Relative: 0 %
HCT: 26.4 % — ABNORMAL LOW (ref 36.0–46.0)
Hemoglobin: 8.5 g/dL — ABNORMAL LOW (ref 12.0–15.0)
Immature Granulocytes: 1 %
Lymphocytes Relative: 14 %
Lymphs Abs: 1.1 10*3/uL (ref 0.7–4.0)
MCH: 30.4 pg (ref 26.0–34.0)
MCHC: 32.2 g/dL (ref 30.0–36.0)
MCV: 94.3 fL (ref 80.0–100.0)
Monocytes Absolute: 0.7 10*3/uL (ref 0.1–1.0)
Monocytes Relative: 9 %
Neutro Abs: 5.9 10*3/uL (ref 1.7–7.7)
Neutrophils Relative %: 75 %
Platelets: 238 10*3/uL (ref 150–400)
RBC: 2.8 MIL/uL — ABNORMAL LOW (ref 3.87–5.11)
RDW: 18.6 % — ABNORMAL HIGH (ref 11.5–15.5)
WBC: 7.9 10*3/uL (ref 4.0–10.5)
nRBC: 0.6 % — ABNORMAL HIGH (ref 0.0–0.2)

## 2022-09-20 LAB — COMPREHENSIVE METABOLIC PANEL
ALT: 5 U/L (ref 0–44)
AST: 102 U/L — ABNORMAL HIGH (ref 15–41)
Albumin: 3 g/dL — ABNORMAL LOW (ref 3.5–5.0)
Alkaline Phosphatase: 199 U/L — ABNORMAL HIGH (ref 38–126)
Anion gap: 12 (ref 5–15)
BUN: 33 mg/dL — ABNORMAL HIGH (ref 8–23)
CO2: 23 mmol/L (ref 22–32)
Calcium: 8.6 mg/dL — ABNORMAL LOW (ref 8.9–10.3)
Chloride: 105 mmol/L (ref 98–111)
Creatinine, Ser: 1.38 mg/dL — ABNORMAL HIGH (ref 0.44–1.00)
GFR, Estimated: 38 mL/min — ABNORMAL LOW (ref >60)
Glucose, Bld: 71 mg/dL (ref 70–99)
Potassium: 4 mmol/L (ref 3.5–5.1)
Sodium: 140 mmol/L (ref 135–145)
Total Bilirubin: 1 mg/dL (ref 0.3–1.2)
Total Protein: 5.7 g/dL — ABNORMAL LOW (ref 6.5–8.1)

## 2022-09-20 LAB — TYPE AND SCREEN: ABO/RH(D): O POS

## 2022-09-20 LAB — CULTURE, BLOOD (ROUTINE X 2)

## 2022-09-20 LAB — PHOSPHORUS: Phosphorus: 3.2 mg/dL (ref 2.5–4.6)

## 2022-09-20 LAB — MAGNESIUM: Magnesium: 2.4 mg/dL (ref 1.7–2.4)

## 2022-09-20 LAB — BPAM RBC
Blood Product Expiration Date: 202405232359
Unit Type and Rh: 9500

## 2022-09-20 MED ORDER — OLANZAPINE 5 MG PO TABS
15.0000 mg | ORAL_TABLET | Freq: Every day | ORAL | Status: DC
  Administered 2022-09-20 – 2022-09-22 (×3): 15 mg via ORAL
  Filled 2022-09-20 (×3): qty 3

## 2022-09-20 MED ORDER — BISACODYL 10 MG RE SUPP
10.0000 mg | Freq: Once | RECTAL | Status: AC
  Administered 2022-09-20: 10 mg via RECTAL
  Filled 2022-09-20: qty 1

## 2022-09-20 MED ORDER — ALPRAZOLAM 0.25 MG PO TABS
0.2500 mg | ORAL_TABLET | Freq: Two times a day (BID) | ORAL | Status: DC | PRN
  Administered 2022-09-20 – 2022-09-21 (×2): 0.25 mg via ORAL
  Filled 2022-09-20 (×3): qty 1

## 2022-09-20 NOTE — Progress Notes (Signed)
Daily Progress Note   Patient Name: Sydney Gillespie       Date: 09/20/2022 DOB: 1941/03/11  Age: 82 y.o. MRN#: [id number] Attending Physician: Drema Dallas, MD Primary Care Physician: Barbette Reichmann, MD Admit Date: 09/17/2022  Reason for Consultation/Follow-up: Establishing goals of care  HPI/Brief Hospital Review: 82 y.o. female  with past medical history of recent MSSA bacteremia remains on Cefazolin, multiple myeloma, CKD stage III, obstructive lung disease, HTN, HLD, RA, history of DVT on apixaban admitted from [facility] on 09/17/2022 with acute respiratory failure with hypoxia and volume overload.   Noted recent admission 4/23-4/29 for acute on chronic kidney disease   Granddaughter reports increase in BLE edema since discharge on 4/29 with acute complaints of shortness of breath/difficulty breathing early morning of admission   [name] is familiar to PMT services as we followed her through most recent admission as noted above    Palliative medicine was consulted for assisting with goals of care conversations.  Subjective: Extensive chart review has been completed prior to meeting patient including labs, vital signs, imaging, progress notes, orders, and available advanced directive documents from current and previous encounters.    Visited with [name] at her bedside. Eyes closed but responds verbally, remains confused, talking and addressing people in the room that are not present. Unable to appropriately answer orientation questions. Cannot recall our visit from yesterday. Assisted nursing staff with placing on bedpan, [name] complaining of constipation, assessed and noted stool impaction, one time dulcolax suppository ordered, nursing staff  aware.  Granddaughter-[name] present at bedside during time of visit. We reflect on conversations had yesterday. [name] shares she was able to spend some time away from the hospital yesterday afternoon/evening. She had a chance to speak with her sister in law and they were able to reminisce which brought her much comfort. [name] shares she feels there has been an ongoing decline in mentation with [name] compared to yesterday. I shared her prognosis remains guarded in light of multiorgan failure complicated by worsening encephalopathy/hospital induced delirium.  [name] shares she continues to consider ongoing goals of care and best next steps for her grandmother. She understands the severity and complexity of her current situation. We discussed the overall philosophy of hospice/comfort care with a transition of focus on providing comfort and stopping  aggressive medical interventions. Voices understanding. Needs time to talk more with her family, including her brothers that live in [place]. She anticipates her brothers and their families will want time to be able to visit with [name].  During our visit, [name] requesting a sip of water. Attempted to assist her with cup of water-she was unable to comprehend and process the ability to use a straw or sip from cup. Discussed this with [name] and the high risk of aspiration due to current mentation. Primary team made aware of findings and concerns. Recommended [name] avoid attempting to feed or offer liquid to [name] until she was more alert and aware.  Shared provider from PMT will follow up tomorrow and through the week. Recommended [name] the time she needed to process and discuss with her family. Ongoing emotional support provided.  Objective:  Vital Signs: BP (!) 144/69 (BP Location: Left Arm)   Pulse 77   Temp 98.2 F (36.8 C) (Oral)   Resp 18   Ht 5\' 5"  (1.651 m)   Wt 61.8 kg   SpO2 100%   BMI 22.67 kg/m  SpO2: SpO2:  100 % O2 Device: O2 Device: Nasal Cannula O2 Flow Rate: O2 Flow Rate (L/min): 2 L/min   Palliative Care Assessment & Plan   Assessment/Recommendation/Plan  DNR One time dulcolax suppository due to constipation/impaction Educated family on high risk of aspiration-recommend possible NPO status until able to be evaluated by ST, recommendations provided to primary team PMT to continue to follow for ongoing needs and support  Care plan was discussed with nursing staff and primary team.  Thank you for allowing the Palliative Medicine Team to assist in the care of this patient.  Total time:  50 minutes  Time spent includes: Detailed review of medical records (labs, imaging, vital signs), medically appropriate exam (mental status, respiratory, cardiac, skin), discussed with treatment team, counseling and educating patient, family and staff, documenting clinical information, medication management and coordination of care.  [name], DNP, AGNP-C Palliative Medicine   Please contact Palliative Medicine Team phone at [phone] for questions and concerns.

## 2022-09-20 NOTE — Progress Notes (Signed)
[facility] CLINIC CARDIOLOGY CONSULT NOTE       Patient ID: Sydney Gillespie MRN: [id number] DOB/AGE: 1940-07-09 82 y.o.  Admit date: 09/17/2022 Referring Physician Dr. Doree Albee  Primary Physician Dr. Marcello Fennel Primary Cardiologist Dr. Darrold Junker Reason for Consultation CHF  HPI: Sydney Gillespie "Sydney Gillespie" is an 81yoF with a PMH of multiple myeloma (dx 2012 s/p stem cell transplant, relapse 2021, ongoing tx), HFpEF, moderate MR, recent DVT (currently taking Eliquis) CKD 4, RA, palpitations who presented to [facility] ED from a rehab facility on 09/17/2022 in respiratory distress.  Cardiology is consulted for further assistance.  Interval history: -Patient alert and oriented again this morning, sitting upright in bed.  -Bump in Cr today to 1.38 despite holding diuretic yesterday. Appears euvolemic on exam. Currently on 2L [place].  -S/p 1 unit PRBC yesterday  Review of systems complete and found to be negative unless listed above     Past Medical History:  Diagnosis Date   Acute respiratory failure with hypoxia (HCC) 05/07/2022   Acute respiratory failure with hypoxia (HCC) 05/07/2022   Allergic rhinitis    Allergy    Anemia    Anemia    Barrett's esophagus    Blood dyscrasia    multiple myloma remission   Cancer (HCC)    Change in bowel habits    Compression fracture 2013   Degenerative disc disease, lumbar    Dysrhythmia    palpitations   Esophageal reflux    Family history of adverse reaction to anesthesia    nausea -mom   GERD (gastroesophageal reflux disease)    Gout    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HCC)    Heart palpitations    Hyperlipidemia    Hypothyroidism    Inflammatory polyarthropathy (HCC)    Lumbago    Lumbar radiculitis    Lumbar stenosis with neurogenic claudication    Multiple myeloma (HCC)    Multiple myeloma (HCC)    Neuropathy    Bilateral Feet   Osteopenia    Osteoporosis    Other dysphagia    Palpitations    Pneumonia 04/2022   R/T COVID    Positive PPD    Renal insufficiency    Rheumatoid arthritis (HCC)    Rheumatoid arthritis (HCC)    Seasonal allergies    Tuberculosis 2011   Vertigo     Past Surgical History:  Procedure Laterality Date   ABDOMINAL HYSTERECTOMY     APPENDECTOMY     BACK SURGERY     BREAST EXCISIONAL BIOPSY Bilateral    neg   CATARACT EXTRACTION Bilateral    CHOLECYSTECTOMY     COLONOSCOPY WITH PROPOFOL N/A 11/12/2016   Procedure: COLONOSCOPY WITH PROPOFOL;  Surgeon: Scot Jun, MD;  Location: [facility] ENDOSCOPY;  Service: Endoscopy;  Laterality: N/A;   ESOPHAGOGASTRODUODENOSCOPY (EGD) WITH PROPOFOL N/A 11/12/2016   Procedure: ESOPHAGOGASTRODUODENOSCOPY (EGD) WITH PROPOFOL;  Surgeon: Scot Jun, MD;  Location: [facility] ENDOSCOPY;  Service: Endoscopy;  Laterality: N/A;   ESOPHAGOGASTRODUODENOSCOPY (EGD) WITH PROPOFOL N/A 07/17/2022   Procedure: ESOPHAGOGASTRODUODENOSCOPY (EGD) WITH PROPOFOL;  Surgeon: Regis Bill, MD;  Location: [facility] ENDOSCOPY;  Service: Endoscopy;  Laterality: N/A;   FOOT SURGERY     KYPHOPLASTY N/A 07/29/2022   Procedure: KYPHOPLASTY THORACIC EIGHT, THORACIC NINE, THORACIC TEN;  Surgeon: Tressie Stalker, MD;  Location: [facility] OR;  Service: Neurosurgery;  Laterality: N/A;  3C   LUMBAR LAMINECTOMY/DECOMPRESSION MICRODISCECTOMY Left 06/18/2016   Procedure: LAMINECTOMY FOR FACET/SYNOVIAL CYST LUMBAR THREE - LUMBAR FOUR LEFT;  Surgeon: Tinnie Gens  Lovell Sheehan, MD;  Location: [facility] OR;  Service: Neurosurgery;  Laterality: Left;  LAMINECTOMY FOR FACET/SYNOVIAL CYST LUMBAR THREE - LUMBAR FOUR LEFT    Medications Prior to Admission  Medication Sig Dispense Refill Last Dose   acetaminophen (TYLENOL) 325 MG tablet Take 2 tablets (650 mg total) by mouth every 6 (six) hours as needed for mild pain (or Fever >/= 101).   unk   allopurinol (ZYLOPRIM) 300 MG tablet Take 300 mg by mouth daily.   09/16/2022 at 0815   ALPRAZolam (XANAX) 0.25 MG tablet Take 1 tablet (0.25 mg total) by mouth 2 (two) times  daily as needed. Once daily for anxiety or sleep (Patient taking differently: Take 0.25 mg by mouth 3 (three) times daily as needed for sleep or anxiety. Once daily for anxiety or sleep) 10 tablet 0 09/16/2022 at 1935   Ascorbic Acid (VITAMIN C) 1000 MG tablet Take 1,000 mg by mouth daily.   09/16/2022 at 0815   CALCIUM MAGNESIUM ZINC PO Take 1 tablet by mouth 2 (two) times daily. 1 am, 2 hs   09/16/2022 at 1934   ceFAZolin (ANCEF) IVPB Inject 2 g into the vein every 12 (twelve) hours for 13 days. Indication:  MSSA bacteremia and discitis First Dose: Yes Last Day of Therapy:  09/17/2022 Labs - Once weekly:  CBC/D, CMP, ESR and CRP Please pull PIC at completion of IV antibiotics Fax weekly lab results  promptly to [phone] Method of administration: IV Push Method of administration may be changed at the discretion of home infusion pharmacist based upon assessment of the patient and/or caregiver's ability to self-administer the medication ordered. 26 Units 0 09/16/2022 at 2112   Cholecalciferol (VITAMIN D3) 125 MCG (5000 UT) TABS Take 1 tablet by mouth daily.   09/16/2022 at 0958   docusate sodium (COLACE) 100 MG capsule Take 1 capsule (100 mg total) by mouth 2 (two) times daily. 30 capsule 0 09/16/2022 at 1805   ELIQUIS 5 MG TABS tablet TAKE 1 TABLET(5 MG) BY MOUTH TWICE DAILY (Patient taking differently: Take 5 mg by mouth 2 (two) times daily.) 60 tablet 1 09/16/2022 at 1934   furosemide (LASIX) 20 MG tablet Take 10 mg by mouth daily.   09/15/2022 at 0814   Heparin Sod, Pork, Lock Flush (HEPARIN LOCK FLUSH IV) Inject 5 mLs into the vein 2 (two) times daily.   09/16/2022 at 2112   hydroxychloroquine (PLAQUENIL) 200 MG tablet Take 200 mg by mouth See admin instructions. Patient currently takes 200 mg twice daily Monday through Friday and 200 mg once daily Saturday and Sunday.   09/16/2022 at 1934   Ipratropium-Albuterol (COMBIVENT) 20-100 MCG/ACT AERS respimat Inhale 1 puff into the lungs every 6 (six) hours as needed  for wheezing. 4 g 0 unk   iron polysaccharides (NIFEREX) 150 MG capsule Take 1 capsule (150 mg total) by mouth daily. 30 capsule 1 09/16/2022 at 0958   levothyroxine (SYNTHROID) 137 MCG tablet Take 1 tablet (137 mcg total) by mouth daily at 6 (six) AM. 30 tablet 0 09/16/2022 at 0603   midodrine (PROAMATINE) 5 MG tablet Take 1 tablet (5 mg total) by mouth 3 (three) times daily with meals. 90 tablet 0 09/16/2022 at 0958   Multiple Vitamin (MULTIVITAMIN WITH MINERALS) TABS tablet Take 1 tablet by mouth daily. 30 tablet 0 09/16/2022 at 0815   nystatin-triamcinolone (MYCOLOG II) cream Apply 1 Application topically 2 (two) times daily as needed (itching).   unk   OLANZapine (ZYPREXA) 10 MG tablet Take  1 tablet (10 mg total) by mouth at bedtime. 30 tablet 0 09/16/2022 at 2112   polyethylene glycol (MIRALAX / GLYCOLAX) 17 g packet Take 17 g by mouth daily. 30 each 0 09/16/2022 at 0815   Potassium 99 MG TABS Take 1 tablet by mouth daily.   09/16/2022 at 0815   rOPINIRole (REQUIP) 0.25 MG tablet Take 1 tablet (0.25 mg total) by mouth at bedtime. (Patient taking differently: Take 0.5 mg by mouth at bedtime.) 30 tablet 0 09/16/2022 at 1934   senna-docusate (SENOKOT-S) 8.6-50 MG tablet Take 1 tablet by mouth 2 (two) times daily. Hold if having loose or frequent stools. 30 tablet 0 09/16/2022 at 1805   sertraline (ZOLOFT) 50 MG tablet Take 1 tablet (50 mg total) by mouth daily. 30 tablet 3 09/16/2022 at 0815   vitamin B-12 (CYANOCOBALAMIN) 1000 MCG tablet Take 1,000 mcg by mouth daily.   09/16/2022 at 0815   zinc gluconate 50 MG tablet Take 50 mg by mouth daily.   09/16/2022 at 0958   feeding supplement (ENSURE ENLIVE / ENSURE PLUS) LIQD Take 237 mLs by mouth 3 (three) times daily between meals. 21330 mL 0    sodium chloride flush (NS) 0.9 % SOLN Inject 3 mLs into the vein every 12 (twelve) hours. 100 mL 0    Spacer/Aero-Holding Chambers DEVI 1 each by Does not apply route as needed. 1 each 0     Social History   Socioeconomic  History   Marital status: Widowed    Spouse name: Not on file   Number of children: Not on file   Years of education: Not on file   Highest education level: Not on file  Occupational History   Not on file  Tobacco Use   Smoking status: Never   Smokeless tobacco: Never  Vaping Use   Vaping Use: Never used  Substance and Sexual Activity   Alcohol use: No   Drug use: No   Sexual activity: Yes  Other Topics Concern   Not on file  Social History Narrative   Not on file   Social Determinants of Health   Financial Resource Strain: Not on file  Food Insecurity: No Food Insecurity (09/04/2022)   Hunger Vital Sign    Worried About Running Out of Food in the Last Year: Never true    Ran Out of Food in the Last Year: Never true  Transportation Needs: No Transportation Needs (08/05/2022)   PRAPARE - Administrator, Civil Service (Medical): No    Lack of Transportation (Non-Medical): No  Physical Activity: Not on file  Stress: Not on file  Social Connections: Not on file  Intimate Partner Violence: Not At Risk (08/05/2022)   Humiliation, Afraid, Rape, and Kick questionnaire    Fear of Current or Ex-Partner: No    Emotionally Abused: No    Physically Abused: No    Sexually Abused: No    Family History  Problem Relation Age of Onset   Hypertension Mother    Heart attack Mother    Rheum arthritis Mother    Osteoarthritis Mother    Cancer Father    Hypertension Father    Stroke Father    Breast cancer Maternal Grandmother 40   Osteoarthritis Other    Rheum arthritis Other       Intake/Output Summary (Last 24 hours) at 09/20/2022 1143 Last data filed at 09/20/2022 1100 Gross per 24 hour  Intake 380 ml  Output --  Net 380 ml  Vitals:   09/19/22 2242 09/19/22 2259 09/20/22 0106 09/20/22 0747  BP: (!) 140/75 136/85 137/79 (!) 149/82  Pulse: 87 84 85 81  Resp: 16 16 16 18   Temp: 98.5 F (36.9 C) 98.2 F (36.8 C) 97.7 F (36.5 C) 98.2 F (36.8 C)  TempSrc:  Oral Oral Oral Oral  SpO2: 100% 100%    Weight:      Height:        PHYSICAL EXAM General: pleasant elderly Caucasian female, sitting upright in bed with no family present.  HEENT:  Normocephalic and atraumatic. Neck:  No JVD.  Lungs: Normal respiratory effort on oxygen by nasal cannula.  Improved bibasilar crackles bilaterally.   Heart: Regular rate, regular rhythm. Normal S1 and S2, 3/6 holosystolic murmur best heard at the apex. Abdomen: Non-distended appearing.  Msk: Normal strength and tone for age. Extremities: Warm and well perfused. No clubbing, cyanosis. No bilateral lower extremity edema.  Neuro: Alert but disoriented to time and situation Psych:  Answers questions appropriately.   Labs: Basic Metabolic Panel: Recent Labs    09/19/22 0845 09/20/22 0730  NA 141 140  K 3.8 4.0  CL 107 105  CO2 24 23  GLUCOSE 79 71  BUN 24* 33*  CREATININE 1.21* 1.38*  CALCIUM 8.1* 8.6*  MG 1.7 2.4  PHOS 2.7 3.2   Liver Function Tests: Recent Labs    09/19/22 0845 09/20/22 0730  AST 117* 102*  ALT 8 5  ALKPHOS 231* 199*  BILITOT 0.8 1.0  PROT 5.8* 5.7*  ALBUMIN 3.0* 3.0*   No results for input(s): "LIPASE", "AMYLASE" in the last 72 hours. CBC: Recent Labs    09/19/22 0930 09/20/22 0730  WBC 8.3 7.9  NEUTROABS 6.1 5.9  HGB 7.2* 8.5*  HCT 22.9* 26.4*  MCV 95.8 94.3  PLT 209 238   Cardiac Enzymes: No results for input(s): "CKTOTAL", "CKMB", "CKMBINDEX", "TROPONINIHS" in the last 72 hours.  BNP: No results for input(s): "BNP" in the last 72 hours.  D-Dimer: No results for input(s): "DDIMER" in the last 72 hours. Hemoglobin A1C: No results for input(s): "HGBA1C" in the last 72 hours. Fasting Lipid Panel: Recent Labs    09/19/22 0845  CHOL 89  HDL 53  LDLCALC 23  TRIG 67  CHOLHDL 1.7   Thyroid Function Tests: No results for input(s): "TSH", "T4TOTAL", "T3FREE", "THYROIDAB" in the last 72 hours.  Invalid input(s): "FREET3" Anemia Panel: No results  for input(s): "VITAMINB12", "FOLATE", "FERRITIN", "TIBC", "IRON", "RETICCTPCT" in the last 72 hours.   Radiology: ECHOCARDIOGRAM LIMITED  Result Date: 09/17/2022    ECHOCARDIOGRAM LIMITED REPORT   Patient Name:   Sydney Gillespie Date of Exam: 09/17/2022 Medical Rec #:  [id number]          Height:       65.0 in Accession #:    [id number]         Weight:       150.0 lb Date of Birth:  01/07/1941           BSA:          1.750 m Patient Age:    82 years           BP:           147/90 mmHg Patient Gender: F                  HR:           105 bpm. Exam Location:  [facility]  Procedure: Limited Echo and Limited Color Doppler Indications:     CHF  History:         Patient has prior history of Echocardiogram examinations, most                  recent 09/01/2022. CHF, Signs/Symptoms:Bacteremia and Syncope;                  Risk Factors:Dyslipidemia. DVT, CKD.  Sonographer:     Mikki Harbor Referring Phys:  [id number] [facility] MICHELLE TANG Diagnosing Phys: Marcina Millard MD IMPRESSIONS  1. Left ventricular ejection fraction, by estimation, is 35 to 40%. The left ventricle has moderately decreased function. The left ventricle has no regional wall motion abnormalities. The left ventricular internal cavity size was mildly dilated. Indeterminate diastolic filling due to E-A fusion.  2. Right ventricular systolic function is normal. The right ventricular size is normal.  3. Left atrial size was moderately dilated.  4. The mitral valve is normal in structure. Severe mitral valve regurgitation. No evidence of mitral stenosis.  5. The aortic valve is normal in structure. Aortic valve regurgitation is moderate to severe. No aortic stenosis is present.  6. The inferior vena cava is normal in size with greater than 50% respiratory variability, suggesting right atrial pressure of 3 mmHg. FINDINGS  Left Ventricle: Left ventricular ejection fraction, by estimation, is 35 to 40%. The left ventricle has moderately decreased function. The left  ventricle has no regional wall motion abnormalities. The left ventricular internal cavity size was mildly dilated. There is no left ventricular hypertrophy. Indeterminate diastolic filling due to E-A fusion. Right Ventricle: The right ventricular size is normal. No increase in right ventricular wall thickness. Right ventricular systolic function is normal. Left Atrium: Left atrial size was moderately dilated. Right Atrium: Right atrial size was normal in size. Pericardium: There is no evidence of pericardial effusion. Mitral Valve: The mitral valve is normal in structure. Severe mitral valve regurgitation. No evidence of mitral valve stenosis. MV peak gradient, 14.3 mmHg. The mean mitral valve gradient is 5.0 mmHg. Tricuspid Valve: The tricuspid valve is normal in structure. Tricuspid valve regurgitation is not demonstrated. No evidence of tricuspid stenosis. Aortic Valve: The aortic valve is normal in structure. Aortic valve regurgitation is moderate to severe. No aortic stenosis is present. Pulmonic Valve: The pulmonic valve was normal in structure. Pulmonic valve regurgitation is not visualized. No evidence of pulmonic stenosis. Aorta: The aortic root is normal in size and structure. Venous: The inferior vena cava is normal in size with greater than 50% respiratory variability, suggesting right atrial pressure of 3 mmHg. IAS/Shunts: No atrial level shunt detected by color flow Doppler. LEFT VENTRICLE PLAX 2D LVIDd:         5.00 cm LVIDs:         3.70 cm LV PW:         0.80 cm LV IVS:        0.70 cm LVOT diam:     2.00 cm LVOT Area:     3.14 cm  LV Volumes (MOD) LV vol d, MOD A2C: 99.0 ml LV vol d, MOD A4C: 80.2 ml LV vol s, MOD A2C: 52.2 ml LV vol s, MOD A4C: 55.4 ml LV SV MOD A2C:     46.8 ml LV SV MOD A4C:     80.2 ml LV SV MOD BP:      35.7 ml LEFT ATRIUM         Index LA diam:  4.60 cm 2.63 cm/m   AORTA Ao Root diam: 3.40 cm MITRAL VALVE MV Area (PHT): 9.73 cm       SHUNTS MV Peak grad:  14.3 mmHg       Systemic Diam: 2.00 cm MV Mean grad:  5.0 mmHg MV Vmax:       1.89 m/s MV Vmean:      98.4 cm/s MV Decel Time: 78 msec MR Peak grad:    136.0 mmHg MR Mean grad:    84.0 mmHg MR Vmax:         583.00 cm/s MR Vmean:        424.0 cm/s MR PISA:         1.57 cm MR PISA Eff ROA: 25 mm MR PISA Radius:  0.50 cm MV E velocity: 160.00 cm/s Marcina Millard MD Electronically signed by Marcina Millard MD Signature Date/Time: 09/17/2022/5:26:11 PM    Final    CT Angio Chest PE W and/or Wo Contrast  Result Date: 09/17/2022 CLINICAL DATA:  Respiratory distress. EXAM: CT ANGIOGRAPHY CHEST WITH CONTRAST TECHNIQUE: Multidetector CT imaging of the chest was performed using the standard protocol during bolus administration of intravenous contrast. Multiplanar CT image reconstructions and MIPs were obtained to evaluate the vascular anatomy. RADIATION DOSE REDUCTION: This exam was performed according to the departmental dose-optimization program which includes automated exposure control, adjustment of the mA and/or kV according to patient size and/or use of iterative reconstruction technique. CONTRAST:  40mL OMNIPAQUE IOHEXOL 350 MG/ML SOLN COMPARISON:  Chest radiograph 1 day prior.  CTA chest 08/05/2022. FINDINGS: Cardiovascular: There is adequate opacification of the pulmonary arteries to the segmental level. There is no evidence of pulmonary embolism. The main pulmonary artery is enlarged measuring up to 3.3 cm. There is mild calcified plaque in the nonaneurysmal thoracic aorta. The heart size is stable. There is a small pericardial effusion, slightly increased in size since the prior CT. A right upper extremity PICC is in place. The tip is obscured by contrast. Mediastinum/Nodes: The thyroid is unremarkable. The esophagus is grossly unremarkable. There is no mediastinal, hilar, or axillary lymphadenopathy. Lungs/Pleura: The trachea and central airways are patent. There are moderate-sized bilateral pleural effusions, right  larger than left, with adjacent atelectasis in the lower lobes. There is additional probable atelectasis in the lingula. There is no convincing interlobular septal thickening. There is no pneumothorax. Upper Abdomen: Cholecystectomy clips are noted. The imaged portions of the upper abdominal viscera are otherwise unremarkable. Musculoskeletal: There is no acute osseous abnormality or suspicious osseous lesion. The patient is status post vertebral augmentation at T8 through T10 with near-complete collapse of the T9 vertebral body. The appearance is unchanged. Mild compression deformity of the T12 vertebral body is unchanged. Review of the MIP images confirms the above findings. IMPRESSION: 1. No evidence of pulmonary embolism. 2. Moderate-sized bilateral pleural effusions, right larger than left, with adjacent atelectasis in the lower lobes. 3. Small pericardial effusion, slightly increased in size since the prior CT. 4. Enlarged main pulmonary artery suggesting pulmonary hypertension. Electronically Signed   By: Lesia Hausen M.D.   On: 09/17/2022 08:45   DG Chest Portable 1 View  Result Date: 09/17/2022 CLINICAL DATA:  Respiratory distress. EXAM: PORTABLE CHEST 1 VIEW COMPARISON:  09/02/2022 FINDINGS: Diffuse interstitial and hazy airspace opacity. Cardiomegaly. Small pleural effusions are likely. Right PICC with tip at the upper SVC. No pneumothorax. IMPRESSION: 1. Diffuse pulmonary opacity which could be edema or infection. 2. Low volume chest with chronic cardiomegaly. Electronically Signed  By: Tiburcio Pea M.D.   On: 09/17/2022 06:01   MR BRAIN WO CONTRAST  Result Date: 09/03/2022 CLINICAL DATA:  Initial evaluation for mental status change, unknown cause. EXAM: MRI HEAD WITHOUT CONTRAST TECHNIQUE: Multiplanar, multiecho pulse sequences of the brain and surrounding structures were obtained without intravenous contrast. COMPARISON:  Prior CT from 09/01/2022. FINDINGS: Brain: Cerebral volume within  normal limits for age. Patchy T2/FLAIR hyperintensity involving the periventricular deep white matter both cerebral hemispheres, consistent with chronic small vessel ischemic disease, mild for age. No evidence for acute or subacute infarct. Gray-white matter differentiation maintained. No areas of chronic cortical infarction. No acute or chronic intracranial blood products. No mass lesion, midline shift or mass effect. No hydrocephalus or extra-axial fluid collection. Pituitary gland and suprasellar region within normal limits. Vascular: Major intracranial vascular flow voids are maintained. Mild torcular inversion noted. Skull and upper cervical spine: Craniocervical junction within normal limits. Bone marrow signal intensity normal. No scalp soft tissue abnormality. Sinuses/Orbits: Prior bilateral ocular lens replacement. Paranasal sinuses are largely clear. No significant mastoid effusion. Other: None. IMPRESSION: 1. No acute intracranial abnormality. 2. Mild chronic microvascular ischemic disease for age. Electronically Signed   By: Rise Mu M.D.   On: 09/03/2022 01:52   ECHOCARDIOGRAM LIMITED  Result Date: 09/02/2022    ECHOCARDIOGRAM LIMITED REPORT   Patient Name:   Sydney Gillespie Date of Exam: 09/01/2022 Medical Rec #:  [id number]          Height:       65.0 in Accession #:    [id number]         Weight:       121.3 lb Date of Birth:  10-Nov-1940           BSA:          1.599 m Patient Age:    82 years           BP:           97/48 mmHg Patient Gender: F                  HR:           64 bpm. Exam Location:  [facility] Procedure: 2D Echo, Cardiac Doppler, Limited Echo and Limited Color Doppler Indications:     R55 Syncope  History:         Patient has prior history of Echocardiogram examinations, most                  recent 08/09/2022. Arrythmias:Palpitations; Risk                  Factors:Dyslipidemia.  Sonographer:     Daphine Deutscher RDCS Referring Phys:  [id number] Verdene Lennert Diagnosing  Phys: Alwyn Pea MD IMPRESSIONS  1. Left ventricular ejection fraction, by estimation, is 50 to 55%. The left ventricle has low normal function. The left ventricle demonstrates regional wall motion abnormalities (see scoring diagram/findings for description). Left ventricular diastolic  parameters were normal.  2. Right ventricular systolic function is normal. The right ventricular size is normal.  3. Left atrial size was mildly dilated.  4. A small pericardial effusion is present.  5. The mitral valve is normal in structure. Trivial mitral valve regurgitation.  6. The aortic valve is calcified. Aortic valve regurgitation is mild to moderate. Aortic valve sclerosis/calcification is present, without any evidence of aortic stenosis. FINDINGS  Left Ventricle: Left ventricular ejection fraction, by estimation, is 50 to 55%. The  left ventricle has low normal function. The left ventricle demonstrates regional wall motion abnormalities. The left ventricular internal cavity size was normal in size. There is no left ventricular hypertrophy. Left ventricular diastolic parameters were normal. Right Ventricle: The right ventricular size is normal. No increase in right ventricular wall thickness. Right ventricular systolic function is normal. Left Atrium: Left atrial size was mildly dilated. Right Atrium: Right atrial size was not assessed. Pericardium: A small pericardial effusion is present. Mitral Valve: The mitral valve is normal in structure. Trivial mitral valve regurgitation. Tricuspid Valve: The tricuspid valve is normal in structure. Tricuspid valve regurgitation is mild. Aortic Valve: The aortic valve is calcified. Aortic valve regurgitation is mild to moderate. Aortic regurgitation PHT measures 572 msec. Aortic valve sclerosis/calcification is present, without any evidence of aortic stenosis. Pulmonic Valve: The pulmonic valve was normal in structure. Pulmonic valve regurgitation is not visualized. Aorta: The  ascending aorta was not well visualized. IAS/Shunts: The interatrial septum was not well visualized. Additional Comments: There is no pleural effusion.  LEFT VENTRICLE PLAX 2D LVIDd:         4.10 cm Diastology LVIDs:         3.00 cm LV e' medial:    6.14 cm/s LV PW:         1.00 cm LV E/e' medial:  10.7 LV IVS:        1.00 cm LV e' lateral:   8.22 cm/s                        LV E/e' lateral: 8.0  RIGHT VENTRICLE             IVC RV S prime:     13.40 cm/s  IVC diam: 1.10 cm TAPSE (M-mode): 1.8 cm LEFT ATRIUM         Index LA diam:    4.60 cm 2.88 cm/m  AORTIC VALVE AI PHT:      572 msec  AORTA Ao Root diam: 3.40 cm MITRAL VALVE               TRICUSPID VALVE MV Area (PHT): 2.95 cm    TR Peak grad:   14.7 mmHg MV Decel Time: 257 msec    TR Vmax:        192.00 cm/s MV E velocity: 65.50 cm/s MV A velocity: 90.70 cm/s MV E/A ratio:  0.72 Dwayne D Callwood MD Electronically signed by Alwyn Pea MD Signature Date/Time: 09/02/2022/7:27:49 AM    Final    DG Chest Port 1 View  Result Date: 09/02/2022 CLINICAL DATA:  PICC placement EXAM: PORTABLE CHEST 1 VIEW COMPARISON:  Yesterday FINDINGS: Right upper extremity PICC with tip at the SVC. Cardiomegaly. Streaky opacity in the bilateral lungs. No visible effusion or pneumothorax. Chronic cardiomegaly. Multilevel cement augmentation in the thoracic spine. Gas distended upper abdominal colon. IMPRESSION: 1. Right upper extremity PICC with tip at the SVC. 2. Cardiomegaly and borderline vascular congestion. Atelectatic type opacity in the bilateral lungs. Electronically Signed   By: Tiburcio Pea M.D.   On: 09/02/2022 04:34   US RENAL  Result Date: 09/01/2022 CLINICAL DATA:  [id number] AKI (acute kidney injury) [id number] EXAM: RENAL / URINARY TRACT ULTRASOUND COMPLETE COMPARISON:  11/04/2017 FINDINGS: Right Kidney: Renal measurements: 9.6 x 4.5 x 4.6 cm = volume: 102 mL. Parenchyma echogenic compared to adjacent liver. No focal lesion or hydronephrosis. Left Kidney: Renal  measurements: 8.2 x 4.2 x 3.7 cm = volume: 67  mL. No hydronephrosis. Exophytic 3.1 x 2.7 x 2.9 cm cyst from lower pole as before. Bladder: Incompletely distended.  Bilateral ureteral jets demonstrated. Other: Bilateral pleural effusions incidentally noted. IMPRESSION: 1. No hydronephrosis. 2. Echogenic renal parenchyma suggesting medical renal disease. 3. 3.1 cm left renal cyst. 4. Bilateral pleural effusions. Electronically Signed   By: Corlis Leak M.D.   On: 09/01/2022 18:35   DG Chest Portable 1 View  Result Date: 09/01/2022 CLINICAL DATA:  Recent pneumonia EXAM: PORTABLE CHEST 1 VIEW COMPARISON:  08/07/2022 FINDINGS: Previously noted bibasilar and right lung opacities are improved, with some residual opacities in the mid to lower right lung and lower left lung, which are somewhat linear and may reflect atelectasis or infection. Unchanged cardiac and mediastinal contours. Aortic atherosclerosis. No pleural effusion or pneumothorax. No acute osseous abnormality. Prior ACDF. IMPRESSION: Previously noted bibasilar and right lung opacities are improved, with some residual opacities in the mid to lower right lung and lower left lung, which may reflect atelectasis and/or infection. Electronically Signed   By: Wiliam Ke M.D.   On: 09/01/2022 15:26   CT HEAD WO CONTRAST ( )  Result Date: 09/01/2022 CLINICAL DATA:  Trauma EXAM: CT HEAD WITHOUT CONTRAST TECHNIQUE: Contiguous axial images were obtained from the base of the skull through the vertex without intravenous contrast. RADIATION DOSE REDUCTION: This exam was performed according to the departmental dose-optimization program which includes automated exposure control, adjustment of the mA and/or kV according to patient size and/or use of iterative reconstruction technique. COMPARISON:  None Available. FINDINGS: Brain: No evidence of acute infarction, hemorrhage, hydrocephalus, extra-axial collection or mass lesion/mass effect. Sequela of moderate chronic  microvascular ischemic change. Vascular: No hyperdense vessel or unexpected calcification. Skull: Normal. Negative for fracture or focal lesion. Sinuses/Orbits: No mastoid or middle ear effusion. Paranasal sinuses are notable for frothy secretions in the bilateral sphenoid sinuses, which can be seen in the setting sinusitis. Bilateral lens replacement. Orbits are otherwise unremarkable. Other: None. IMPRESSION: 1. No acute intracranial abnormality. 2. Frothy secretions in the bilateral sphenoid sinuses can be seen in the setting of acute sinusitis. Electronically Signed   By: Lorenza Cambridge M.D.   On: 09/01/2022 14:36     TELEMETRY reviewed by me [facility]) 09/20/2022 : Sinus rhythm rate 80-90s  EKG reviewed by me: Sinus tachycardia rate 115 with T wave flattening  Data reviewed by me [facility]) 09/20/2022: Hospitalist progress note, nursing notes last 24h vitals tele labs imaging I/O    Active Problems:   Gout   HLD (hyperlipidemia)   Anemia due to stage 3 chronic kidney disease (HCC)   Hypothyroidism   Rheumatoid arthritis (HCC)   CKD (chronic kidney disease), stage IV (HCC)   Elevated troponin   Acute deep vein thrombosis (DVT) of calf muscle vein of right lower extremity (HCC)   MSSA bacteremia   Acute respiratory failure with hypoxia (HCC)   Acute on chronic diastolic CHF (congestive heart failure) (HCC)   Obstructive lung disease (generalized) (HCC)   Hypokalemia    ASSESSMENT AND PLAN:  Sydney Gillespie "Sydney Gillespie" is an 81yoF with a PMH of multiple myeloma (dx 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  Cardiology is consulted for further assistance.  # Delirium Per granddaughter, she has a history of confusion during her previous hospitalizations.  Suspect hospital delirium, neuroexam is nonfocal.  Recommend  frequent reorientation and minimal disturbances in the evening. -Alert and oriented on exam today.   # Acute on chronic HFrEF # Moderate-severe mitral regurgitation BNP markedly elevated at 3200 with pleural effusions bilaterally on chest x-ray and CTA chest.  Clinically hypervolemic on exam with crackles, JVD, with significant peripheral edema, clinical improvement after initial IV diuresis.  Suspect some decompensation in part from stopping her diuretic within the past 10 days and some continued reduction in her EF with multiple severe illnesses requiring hospitalization over the past couple months.  Her MR is moderate to severe by echo this admission. -Received diuresis with IV Lasix 60 mg x 1 5/10. Cr elevated to 1.38 today despite holding diuresis yesterday. Continue to  -Likely change previous home thiazide diuretic to loop diuretic at discharge -Monitor and replenish electrolytes for goal K >4, mag >2 -Consider consolidating Metoprolol tartrate 25 mg tid. Will plan to continue this rather than her previous home medication atenolol at discharge. -Further GDMT as BP and renal function tolerate with ARB/ARNI, MRA, SGLT2i -Will need close clinic follow-up outpatient for further surveillance of her MR  # Sinus tachycardia  In the setting of respiratory distress and hypoxia, also possible rebound tachycardia after stopping atenolol abruptly after her last hospital discharge.  Will start metoprolol as above, and continue Eliquis 5 mg twice daily as treatment of her recent DVT as below.  repeat EKG reviewed by myself and Dr. Darrold Junker demonstrate sinus tachycardia - Continue telemetry monitoring. Patient HR improved to 80-90s this AM. Continue Metoprolol 25 mg tid, consider consolidating dose. -Monitor and replenish electrolytes as above  # Recent MSSA bacteremia PICC line present, antibiotics per primary/infectious disease  # History of DVT Eliquis 5 mg twice daily as above  # Demand  ischemia Borderline elevated and flat trending, in the absence of ischemic EKG changes and in the setting of acute on chronic CHF, this is most consistent with demand/supply mismatch and not ACS   This patient's plan of care was discussed and created with Dr. Darrold Junker and he is in agreement.  Signed: Gale Journey , PA-C 09/20/2022, 11:43 AM [facility]

## 2022-09-20 NOTE — Plan of Care (Signed)
Patient A&Ox2, from home, up with assist in room. 1 unit of blood transfused this shift, patient tolerated well. Patient remains confused and carrying on conversations with herself.

## 2022-09-20 NOTE — Progress Notes (Signed)
Sydney Gillespie ZOX:[id number] DOB: May 01, 1941 DOA: 09/17/2022 PCP: Barbette Reichmann, MD   Subj: Sydney Gillespie is a 82 y.o. WF PMHx recent MSSA bacteremia on cefazolin, multiple myeloma, CKD stage III, obstructive lung disease, Chronic Systolic CHF (LVEF 35 to 40%), HTN, HLD, RA, lumbar DJD, Hx of DVT on Apixaban, Anemia, TB  Presenting with acute respiratory failure with hypoxia, volume overload.  Patient noted to have been recently admitted April 23 through April 29 for acute on chronic kidney disease.  Received IV hydration and discharge.  Per the granddaughter, patient has had worsening lower extremity swelling since discharge.  Has been taking outpatient diuretics still with lower extremity swelling.  Compression hose were not placed at facility per report.  Compression hose were then placed recently with the past 1 to 2 days with patient developing significant shortness of breath.  No fevers or chills.  No nausea or vomiting.  No chest pain.  No abdominal pain.  Developed significant shortness of breath over the past 12 to 24 hours.  No reported NSAID use or high salt intake. Presented to the ER afebrile, heart rate in the 120s, initially requiring BiPAP.  Given 60 mg IV Lasix with patient being transition to 45 L nasal cannula.  White count 10, hemoglobin 8, troponin 26-31, COVID negative, creatinine 0.92.  Potassium 3.0, BNP 3282.  EKG sinus tach.  CTA of the chest negative for PE but does show moderate-sized bilateral pleural effusions right greater than left, small pericardial effusion as well as findings concerning for pulmonary hypertension   Obj: 5/12 afebrile overnight, A/O x 4, sleepy.  Hallucinating seeing people in the room that are not there, wants to place money in my pocket stating she needs to save money and her piggy bank.  Fidgety per RN did not sleep well, attempted to get out of bed multiple times, hallucinating    Objective: VITAL SIGNS: Temp: 98.2 F (36.8 C) (05/12  0747) Temp Source: Oral (05/12 0747) BP: 149/82 (05/12 0747) Pulse Rate: 81 (05/12 0747)   VENTILATOR SETTINGS: Nasal cannula 5/12 Flow 2 L/min SpO2 100%  Procedures/Significant Events: 3/29 MRI T-spine W/W. Wo contrast Status post augmentation at T8, T9 and T10 without progression of height loss. 2. 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 Small amount of ventral epidural enhancing material may be dilated venous plexus or small epidural abscess/phlegmon. 3. Moderate T9 spinal canal stenosis. ----------------------------------------------------------------------------------------------------------------------------------------------0 5/9 echocardiogram limited  Left Ventricle: LVEF= 35 to 40%. The left ventricle has moderately decreased function. -Indeterminate diastolic  Left Atrium: moderately dilated.  Mitral Valve:  Severe mitral valve regurgitation.  Aortic Valve: regurgitation is moderate to severe.  5/9 CTA PE protocol  Moderate-sized bilateral pleural effusions, right larger than left, with adjacent atelectasis in the lower lobes. 3. Small pericardial effusion, slightly increased in size since the prior CT. 4. Enlarged main pulmonary artery suggesting pulmonary hypertension.   Consultants:  Cardiology Dr.Jayashree Ravishankar ID, Palliative care   Cultures 3/27 blood positive MSSA  ---------------------------------------------------------------------------------------------------------------------------------------------------- 5/9 blood RIGHT hand NGTD 5/9 blood RIGHT arm NGTD    Antimicrobials: Anti-infectives (From admission, onward)    Start     Ordered Stop   09/19/22 1000  hydroxychloroquine (PLAQUENIL) tablet 200 mg        09/17/22 1006     09/17/22 1030  hydroxychloroquine (PLAQUENIL) tablet 200 mg       Note to Pharmacy: Patient currently takes 200 mg twice daily Monday through Friday and 200 mg once daily Saturday and Sunday.  09/17/22 0949     09/17/22 1015  ceFAZolin (ANCEF) IVPB 2g/100 mL premix        09/17/22 1009 09/17/22 2200   09/17/22 1000  ceFAZolin (ANCEF) IVPB  Status:  Discontinued       Note to Pharmacy: Indication:  MSSA bacteremia and discitis First Dose: Yes Last Day of Therapy:  09/17/2022 Labs - Once weekly:  CBC/D, CMP, ESR and CRP Please pull PIC at completion of IV antibiotics Fax weekly lab results  promptly to [phone] Method of administration: IV Push Method of administration m   09/17/22 0949 09/17/22 1009        Intake/Output Summary (Last 24 hours) at 09/20/2022 1411 Last data filed at 09/20/2022 1100 Gross per 24 hour  Intake 380 ml  Output --  Net 380 ml    Physical Exam:  General: A/O x 4, easily falls asleep, fidgety, hallucinating, No acute respiratory distress Eyes: negative scleral hemorrhage, negative anisocoria, negative icterus ENT: Negative Runny nose, negative gingival bleeding, Neck:  Negative scars, masses, torticollis, lymphadenopathy, JVD Lungs: Clear to auscultation bilaterally without wheezes or crackles Cardiovascular: Regular rate and rhythm without murmur gallop or rub normal S1 and S2 Abdomen: negative abdominal pain, nondistended, positive soft, bowel sounds, no rebound, no ascites, no appreciable mass Extremities: No significant cyanosis, clubbing, or edema bilateral lower extremities Skin: Negative rashes, lesions, ulcers Psychiatric: Positive hallucinations talking to people that are not there in the room. Central nervous system:  Cranial nerves II through XII intact, tongue/uvula midline, all extremities muscle strength 5/5, sensation intact throughout, negative dysarthria, negative expressive aphasia, negative receptive aphasia.  .   DVT prophylaxis: Apixaban Code Status:  Family Communication: 5/12 granddaughter [name] [facility]) at bedside for discussion of plan of care all questions answered       Dispo: The patient is from: SNF               Anticipated d/c is to: SNF              Anticipated d/c date is: > 3 days              Patient currently is not medically stable to d/c.      Assessment & Plan: Covid vaccination;   Active Problems:   Acute respiratory failure with hypoxia (HCC)   Acute on chronic diastolic CHF (congestive heart failure) (HCC)   CKD (chronic kidney disease), stage IV (HCC)   Elevated troponin   Gout   MSSA bacteremia   HLD (hyperlipidemia)   Anemia due to stage 3 chronic kidney disease (HCC)   Hypothyroidism   Rheumatoid arthritis (HCC)   Acute deep vein thrombosis (DVT) of calf muscle vein of right lower extremity (HCC)   Obstructive lung disease (generalized) (HCC)   Hypokalemia   Acute Systolic CHF (current LVEF 35 to 40%) -April 2024 echocardiogram LVEF 50 to 55% -Acute respiratory failure with volume overload with noted BNP of 3300 -Reach out to cardiology for consultation -Strict in and out +165ml - Daily weight Filed Weights   09/17/22 0525 09/19/22 0725  Weight: 68 kg 61.8 kg  -5/10 Lasix IV 60 mg BID---> decrease to 40mg  daily per cardiology recommendation  -5/10 metoprolol 25 mg BID -5/11 Lasix dose held today per cardiology recommendation.  Elevated troponin Troponins 20s to 30s in the setting of volume overload Suspect secondary heart strain EKG grossly stable Follow  Hypoalbuminemia -5/10 albumin 50 g x 1   Acute respiratory failure with hypoxia (HCC) -Decompensated  respiratory status initially requiring BiPAP-now on 2 L in the setting of flash pulmonary edema and volume overload -CTA chest negative for PE, + moderate bilateral pleural effusions and small pericardial effusion, ? Pulm HTN  -Lasix IV 60 mg BID -5/10 per cardiology    -Decrease Lasix IV 60 mg daily  -Optimize HF medication  -MR is worse than previous at mod-severe.     CKD (chronic kidney disease), stage IV (Baseline creatinine 1-1.7) -If above is truly her baseline creatinine now stage IV  CKD.   -Will continue to diurese secondary to above and determine what baseline creatinine actually is. Lab Results  Component Value Date   CREATININE 1.38 (H) 09/20/2022   CREATININE 1.21 (H) 09/19/2022   CREATININE 1.09 (H) 09/18/2022   CREATININE 0.92 09/17/2022   CREATININE 1.20 (H) 09/07/2022  -5/11 mildly elevated see CHF -5/12 at baseline   Hx MSSA bacteremia -Diagnosed 3/27 has been on antibiotics since. -On IV cefazolin with today May 9 being the last day of treatment.  However will continue until obtain ID consult in a.m. given that repeat labs are pending to see if she is actually clear infection. 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  See MSSA bacteremia  Obstructive lung disease (generalized) (HCC) -Stable, SOB secondary to fluid overload. - See acute respiratory failure with hypoxia   Acute deep vein thrombosis (DVT) of calf muscle vein of right lower extremity (HCC) -Continue Eliquis   Rheumatoid arthritis (HCC) -Continue Plaquenil    Hypothyroidism -Synthroid 137 mcg daily   Anemia due to CKD stage III (baseline HgB~8) -Transfuse for hemoglobin<7  -5/11 transfuse 1 unit PRBC Lab Results  Component Value Date   HGB 8.5 (L) 09/20/2022   HGB 7.2 (L) 09/19/2022   HGB 7.0 (L) 09/19/2022   HGB 8.3 (L) 09/18/2022   HGB 8.0 (L) 09/17/2022   HLD (hyperlipidemia) - 5/11 LDL= within CHF guidelines - 5/11 Lipitor 40 mg daily  Dementia without behavioral disturbance/hospital delirium -5/12 patient having waxing and waning mentation.  At times hallucinating. - 5/12 decrease Xanax 0.25 mg to BID PRN  Insomnia - 5/12 increase Zyprexa 15 mg qhs  Gout -Allopurinol 300 mg daily   Hypokalemia -Potassium goal> 4    Hypocalcemia - Calcium goal> 8.9    Goals of care - 5/11 palliative care consult: Worsening  systolic CHF, as well as additional multiple medical problems consult for palliative care at home vs hospice.  Granddaughter Youth worker) requests that you set up appointment via phone or in person consult with her and grandfather being present.    Mobility Assessment (last 72 hours)     Mobility Assessment     Row Name 09/20/22 0946 09/19/22 2200 09/18/22 2106 09/17/22 1750     Does patient have an order for bedrest or is patient medically unstable No - Continue assessment No - Continue assessment No - Continue assessment No - Continue assessment    What is the highest level of mobility based on the progressive mobility assessment? Level 1 (Bedfast) - Unable to balance while sitting on edge of bed Level 1 (Bedfast) - Unable to balance while sitting on edge of bed Level 3 (Stands with assist) - Balance while standing  and cannot march in place Level 4 (Walks with assist in room) - Balance while marching in place and cannot step forward and back - Complete    Is the above level  different from baseline mobility prior to current illness? -- Yes - Recommend PT order Yes - Recommend PT order No - Consider discontinuing PT/OT                  Time: 50 minutes         Care during the described time interval was provided by me .  I have reviewed this patient's available data, including medical history, events of note, physical examination, and all test results as part of my evaluation.

## 2022-09-21 DIAGNOSIS — F0390 Unspecified dementia without behavioral disturbance: Secondary | ICD-10-CM

## 2022-09-21 DIAGNOSIS — R7881 Bacteremia: Secondary | ICD-10-CM | POA: Diagnosis not present

## 2022-09-21 DIAGNOSIS — J9601 Acute respiratory failure with hypoxia: Secondary | ICD-10-CM | POA: Diagnosis not present

## 2022-09-21 DIAGNOSIS — J449 Chronic obstructive pulmonary disease, unspecified: Secondary | ICD-10-CM | POA: Diagnosis not present

## 2022-09-21 DIAGNOSIS — Z515 Encounter for palliative care: Secondary | ICD-10-CM | POA: Diagnosis not present

## 2022-09-21 LAB — COMPREHENSIVE METABOLIC PANEL
ALT: <5 U/L (ref 0–44)
AST: 68 U/L — ABNORMAL HIGH (ref 15–41)
Albumin: 3 g/dL — ABNORMAL LOW (ref 3.5–5.0)
Alkaline Phosphatase: 185 U/L — ABNORMAL HIGH (ref 38–126)
Anion gap: 13 (ref 5–15)
BUN: 33 mg/dL — ABNORMAL HIGH (ref 8–23)
CO2: 23 mmol/L (ref 22–32)
Calcium: 8.5 mg/dL — ABNORMAL LOW (ref 8.9–10.3)
Chloride: 106 mmol/L (ref 98–111)
Creatinine, Ser: 1.39 mg/dL — ABNORMAL HIGH (ref 0.44–1.00)
GFR, Estimated: 38 mL/min — ABNORMAL LOW (ref >60)
Glucose, Bld: 59 mg/dL — ABNORMAL LOW (ref 70–99)
Potassium: 3.5 mmol/L (ref 3.5–5.1)
Sodium: 142 mmol/L (ref 135–145)
Total Bilirubin: 1.1 mg/dL (ref 0.3–1.2)
Total Protein: 6 g/dL — ABNORMAL LOW (ref 6.5–8.1)

## 2022-09-21 LAB — CBC WITH DIFFERENTIAL/PLATELET
Abs Immature Granulocytes: 0.1 10*3/uL — ABNORMAL HIGH (ref 0.00–0.07)
Basophils Absolute: 0.1 10*3/uL (ref 0.0–0.1)
Basophils Relative: 1 %
Eosinophils Absolute: 0 10*3/uL (ref 0.0–0.5)
Eosinophils Relative: 0 %
HCT: 29.1 % — ABNORMAL LOW (ref 36.0–46.0)
Hemoglobin: 9.2 g/dL — ABNORMAL LOW (ref 12.0–15.0)
Immature Granulocytes: 1 %
Lymphocytes Relative: 13 %
Lymphs Abs: 1.2 10*3/uL (ref 0.7–4.0)
MCH: 30.3 pg (ref 26.0–34.0)
MCHC: 31.6 g/dL (ref 30.0–36.0)
MCV: 95.7 fL (ref 80.0–100.0)
Monocytes Absolute: 0.9 10*3/uL (ref 0.1–1.0)
Monocytes Relative: 9 %
Neutro Abs: 7.6 10*3/uL (ref 1.7–7.7)
Neutrophils Relative %: 76 %
Platelets: 269 10*3/uL (ref 150–400)
RBC: 3.04 MIL/uL — ABNORMAL LOW (ref 3.87–5.11)
RDW: 19 % — ABNORMAL HIGH (ref 11.5–15.5)
WBC: 9.8 10*3/uL (ref 4.0–10.5)
nRBC: 0.2 % (ref 0.0–0.2)

## 2022-09-21 LAB — TYPE AND SCREEN: Donor AG Type: NEGATIVE

## 2022-09-21 LAB — BPAM RBC

## 2022-09-21 LAB — MAGNESIUM: Magnesium: 2.3 mg/dL (ref 1.7–2.4)

## 2022-09-21 LAB — PHOSPHORUS: Phosphorus: 3.8 mg/dL (ref 2.5–4.6)

## 2022-09-21 MED ORDER — METOPROLOL SUCCINATE ER 50 MG PO TB24
75.0000 mg | ORAL_TABLET | Freq: Every day | ORAL | Status: DC
  Administered 2022-09-22 – 2022-09-23 (×2): 75 mg via ORAL
  Filled 2022-09-21 (×2): qty 1

## 2022-09-21 MED ORDER — LOSARTAN POTASSIUM 25 MG PO TABS
12.5000 mg | ORAL_TABLET | Freq: Every day | ORAL | Status: DC
  Administered 2022-09-21 – 2022-09-23 (×3): 12.5 mg via ORAL
  Filled 2022-09-21 (×3): qty 1

## 2022-09-21 MED ORDER — MAGIC MOUTHWASH: 5.0000 mL | Freq: Three times a day (TID) | ORAL | Status: DC | PRN

## 2022-09-21 MED ORDER — POTASSIUM CHLORIDE CRYS ER 20 MEQ PO TBCR
20.0000 meq | EXTENDED_RELEASE_TABLET | Freq: Once | ORAL | Status: AC
  Administered 2022-09-21: 20 meq via ORAL
  Filled 2022-09-21: qty 1

## 2022-09-21 NOTE — TOC Progression Note (Signed)
Transition of Care [facility]) - Progression Note    Patient Details  Name: Sydney Gillespie MRN: [id number] Date of Birth: 10-Oct-1940  Transition of [facility]) CM/SW Contact  Marlowe Sax, RN Phone Number: 09/21/2022, 10:37 AM  Clinical Narrative:    TOC continues to follow and will assist with DC planning Palliative is working with the patient's family on Goals of care, Grand daughter [name] to speak with additional family         Expected Discharge Plan and Services                                               Social Determinants of Health (SDOH) Interventions SDOH Screenings   Food Insecurity: No Food Insecurity (09/04/2022)  Housing: Low Risk  (09/04/2022)  Transportation Needs: No Transportation Needs (08/05/2022)  Utilities: Not At Risk (08/05/2022)  Tobacco Use: Low Risk  (09/17/2022)    Readmission Risk Interventions    09/03/2022    3:44 PM 08/06/2022    3:47 PM 05/10/2022    2:56 PM  Readmission Risk Prevention Plan  Transportation Screening Complete Complete Complete  Medication Review (RN Care Manager) Complete Complete Complete  PCP or Specialist appointment within 3-5 days of discharge  Complete Complete  HRI or Home Care Consult Complete  Complete  SW Recovery Care/Counseling Consult Complete Complete Complete  Palliative Care Screening Complete Not Applicable Not Applicable  Skilled Nursing Facility Complete Not Applicable Not Applicable

## 2022-09-21 NOTE — Progress Notes (Signed)
Palliative Care Progress Note, Assessment & Plan   Patient Name: Sydney Gillespie       Date: 09/21/2022 DOB: July 19, 1940  Age: 82 y.o. MRN#: [id number] Attending Physician: Drema Dallas, MD Primary Care Physician: Barbette Reichmann, MD Admit Date: 09/17/2022  Subjective: Patient is sitting up in bed in no apparent distress.  She acknowledges my presence and is able to make her wishes known.  She is alert and oriented x 4.  Her granddaughter/HCPOA [name] is at bedside.  HPI: 82 y.o. female  with past medical history of recent MSSA bacteremia remains on Cefazolin, multiple myeloma, CKD stage III, obstructive lung disease, HTN, HLD, RA, history of DVT on apixaban admitted from [facility] on 09/17/2022 with acute respiratory failure with hypoxia and volume overload.   Noted recent admission 4/23-4/29 for acute on chronic kidney disease   Granddaughter reports increase in BLE edema since discharge on 4/29 with acute complaints of shortness of breath/difficulty breathing early morning of admission   Sydney Gillespie is familiar to PMT services as we followed her through most recent admission as noted above    Palliative medicine was consulted for assisting with goals of care conversations.  Summary of counseling/coordination of care: After reviewing the patient's chart and assessing the patient at bedside, I spoke with patient and granddaughter in regards to symptom management and goals of care.  Patient and granddaughter are happy to report that patient feels "back to normal".  Patient shares she was confused and having hallucinations but does not recall most of the previous days.    She endorsed that she slept well overnight and would like to continue the medication that Dr. Joseph Art provided to help with  sleep.  We discussed use of zyprexa and Xanax to aid in anxiety and rest.  Also discussed constipation.  Patient has had 2 bowel movements this morning.  Constipation has resolved with suppository.  She shares she would like to have the Magic mouthwash so that her mouth sores can heal and she can eventually wear her dentures. Mouth has sore from dentures but no s/s of thrush. Magic mouthwash orders placed PRN.   Patient denies shortness of breath, pain, and other discomfort at this time. No further adjustments to medications needed at this time.   We discussed goals of care and patient's wishes. Patient and granddaughter hope that patient can be discharged back to [facility] if/when medically stable.  I assured them I would convey their wishes to medical team and that [facility] is following closely.  Therapeutic silence and active listening provided for patient and granddaughter to share their thoughts and emotions regarding current medical situation.  Grief, PTSD, and loss of loved ones discussed.  Emotional support provided.  PMT will continue to follow and support patient and family throughout her hospitalization.  Physical Exam Vitals reviewed.  Constitutional:      General: She is not in acute distress.    Appearance: She is normal weight.  HENT:     Head: Normocephalic.     Mouth/Throat:     Mouth: Mucous membranes are moist.  Eyes:     Pupils: Pupils are equal, round, and reactive to light.  Pulmonary:  Effort: Pulmonary effort is normal.  Abdominal:     Palpations: Abdomen is soft.  Skin:    General: Skin is warm and dry.  Neurological:     Mental Status: She is alert and oriented to person, place, and time.  Psychiatric:        Mood and Affect: Mood normal.        Behavior: Behavior normal.        Thought Content: Thought content normal.        Judgment: Judgment normal.             Total Time 35 minutes   Christpoher Sievers L. Manon Hilding, FNP-BC Palliative Medicine  Team Team Phone # [phone]

## 2022-09-21 NOTE — Progress Notes (Signed)
[facility] CLINIC CARDIOLOGY CONSULT NOTE       Patient ID: Sydney Gillespie MRN: [id number] DOB/AGE: May 29, 1940 82 y.o.  Admit date: 09/17/2022 Referring Physician Dr. Doree Albee  Primary Physician Dr. Marcello Fennel Primary Cardiologist Dr. Darrold Junker Reason for Consultation CHF  HPI: Sydney Devon "[name]" is an 81yoF with a PMH of multiple myeloma (dx 2012 s/p stem cell transplant, relapse 2021, ongoing tx), HFpEF, moderate MR, recent DVT (currently taking Eliquis) CKD 4, RA, palpitations who presented to [facility] ED from a rehab facility on 09/17/2022 in respiratory distress.  Cardiology is consulted for further assistance.  Interval history: -Alert and oriented today, acknowledges that "she was not mentally here yesterday." -Denies chest pain or shortness of breath, on room air -Creatinine and peripheral edema stable -Sinus rhythm on telemetry with heart rate well-controlled -GOC discussions ongoing with patient and family  Review of systems complete and found to be negative unless listed above     Past Medical History:  Diagnosis Date   Acute respiratory failure with hypoxia (HCC) 05/07/2022   Acute respiratory failure with hypoxia (HCC) 05/07/2022   Allergic rhinitis    Allergy    Anemia    Anemia    Barrett's esophagus    Blood dyscrasia    multiple myloma remission   Cancer (HCC)    Change in bowel habits    Compression fracture 2013   Degenerative disc disease, lumbar    Dysrhythmia    palpitations   Esophageal reflux    Family history of adverse reaction to anesthesia    nausea -mom   GERD (gastroesophageal reflux disease)    Gout    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HCC)    Heart palpitations    Hyperlipidemia    Hypothyroidism    Inflammatory polyarthropathy (HCC)    Lumbago    Lumbar radiculitis    Lumbar stenosis with neurogenic claudication    Multiple myeloma (HCC)    Multiple myeloma (HCC)    Neuropathy    Bilateral Feet   Osteopenia    Osteoporosis     Other dysphagia    Palpitations    Pneumonia 04/2022   R/T COVID   Positive PPD    Renal insufficiency    Rheumatoid arthritis (HCC)    Rheumatoid arthritis (HCC)    Seasonal allergies    Tuberculosis 2011   Vertigo     Past Surgical History:  Procedure Laterality Date   ABDOMINAL HYSTERECTOMY     APPENDECTOMY     BACK SURGERY     BREAST EXCISIONAL BIOPSY Bilateral    neg   CATARACT EXTRACTION Bilateral    CHOLECYSTECTOMY     COLONOSCOPY WITH PROPOFOL N/A 11/12/2016   Procedure: COLONOSCOPY WITH PROPOFOL;  Surgeon: Scot Jun, MD;  Location: [facility] ENDOSCOPY;  Service: Endoscopy;  Laterality: N/A;   ESOPHAGOGASTRODUODENOSCOPY (EGD) WITH PROPOFOL N/A 11/12/2016   Procedure: ESOPHAGOGASTRODUODENOSCOPY (EGD) WITH PROPOFOL;  Surgeon: Scot Jun, MD;  Location: [facility] ENDOSCOPY;  Service: Endoscopy;  Laterality: N/A;   ESOPHAGOGASTRODUODENOSCOPY (EGD) WITH PROPOFOL N/A 07/17/2022   Procedure: ESOPHAGOGASTRODUODENOSCOPY (EGD) WITH PROPOFOL;  Surgeon: Regis Bill, MD;  Location: [facility] ENDOSCOPY;  Service: Endoscopy;  Laterality: N/A;   FOOT SURGERY     KYPHOPLASTY N/A 07/29/2022   Procedure: KYPHOPLASTY THORACIC EIGHT, THORACIC NINE, THORACIC TEN;  Surgeon: Tressie Stalker, MD;  Location: [facility] OR;  Service: Neurosurgery;  Laterality: N/A;  3C   LUMBAR LAMINECTOMY/DECOMPRESSION MICRODISCECTOMY Left 06/18/2016   Procedure: LAMINECTOMY FOR FACET/SYNOVIAL CYST LUMBAR THREE -  LUMBAR FOUR LEFT;  Surgeon: Tressie Stalker, MD;  Location: [facility] OR;  Service: Neurosurgery;  Laterality: Left;  LAMINECTOMY FOR FACET/SYNOVIAL CYST LUMBAR THREE - LUMBAR FOUR LEFT    Medications Prior to Admission  Medication Sig Dispense Refill Last Dose   acetaminophen (TYLENOL) 325 MG tablet Take 2 tablets (650 mg total) by mouth every 6 (six) hours as needed for mild pain (or Fever >/= 101).   unk   allopurinol (ZYLOPRIM) 300 MG tablet Take 300 mg by mouth daily.   09/16/2022 at 0815   ALPRAZolam (XANAX)  0.25 MG tablet Take 1 tablet (0.25 mg total) by mouth 2 (two) times daily as needed. Once daily for anxiety or sleep (Patient taking differently: Take 0.25 mg by mouth 3 (three) times daily as needed for sleep or anxiety. Once daily for anxiety or sleep) 10 tablet 0 09/16/2022 at 1935   Ascorbic Acid (VITAMIN C) 1000 MG tablet Take 1,000 mg by mouth daily.   09/16/2022 at 0815   CALCIUM MAGNESIUM ZINC PO Take 1 tablet by mouth 2 (two) times daily. 1 am, 2 hs   09/16/2022 at 1934   [EXPIRED] ceFAZolin (ANCEF) IVPB Inject 2 g into the vein every 12 (twelve) hours for 13 days. Indication:  MSSA bacteremia and discitis First Dose: Yes Last Day of Therapy:  09/17/2022 Labs - Once weekly:  CBC/D, CMP, ESR and CRP Please pull PIC at completion of IV antibiotics Fax weekly lab results  promptly to [phone] Method of administration: IV Push Method of administration may be changed at the discretion of home infusion pharmacist based upon assessment of the patient and/or caregiver's ability to self-administer the medication ordered. 26 Units 0 09/16/2022 at 2112   Cholecalciferol (VITAMIN D3) 125 MCG (5000 UT) TABS Take 1 tablet by mouth daily.   09/16/2022 at 0958   docusate sodium (COLACE) 100 MG capsule Take 1 capsule (100 mg total) by mouth 2 (two) times daily. 30 capsule 0 09/16/2022 at 1805   ELIQUIS 5 MG TABS tablet TAKE 1 TABLET(5 MG) BY MOUTH TWICE DAILY (Patient taking differently: Take 5 mg by mouth 2 (two) times daily.) 60 tablet 1 09/16/2022 at 1934   furosemide (LASIX) 20 MG tablet Take 10 mg by mouth daily.   09/15/2022 at 0814   Heparin Sod, Pork, Lock Flush (HEPARIN LOCK FLUSH IV) Inject 5 mLs into the vein 2 (two) times daily.   09/16/2022 at 2112   hydroxychloroquine (PLAQUENIL) 200 MG tablet Take 200 mg by mouth See admin instructions. Patient currently takes 200 mg twice daily Monday through Friday and 200 mg once daily Saturday and Sunday.   09/16/2022 at 1934   Ipratropium-Albuterol (COMBIVENT) 20-100  MCG/ACT AERS respimat Inhale 1 puff into the lungs every 6 (six) hours as needed for wheezing. 4 g 0 unk   iron polysaccharides (NIFEREX) 150 MG capsule Take 1 capsule (150 mg total) by mouth daily. 30 capsule 1 09/16/2022 at 0958   levothyroxine (SYNTHROID) 137 MCG tablet Take 1 tablet (137 mcg total) by mouth daily at 6 (six) AM. 30 tablet 0 09/16/2022 at 0603   midodrine (PROAMATINE) 5 MG tablet Take 1 tablet (5 mg total) by mouth 3 (three) times daily with meals. 90 tablet 0 09/16/2022 at 0958   Multiple Vitamin (MULTIVITAMIN WITH MINERALS) TABS tablet Take 1 tablet by mouth daily. 30 tablet 0 09/16/2022 at 0815   nystatin-triamcinolone (MYCOLOG II) cream Apply 1 Application topically 2 (two) times daily as needed (itching).   unk  OLANZapine (ZYPREXA) 10 MG tablet Take 1 tablet (10 mg total) by mouth at bedtime. 30 tablet 0 09/16/2022 at 2112   polyethylene glycol (MIRALAX / GLYCOLAX) 17 g packet Take 17 g by mouth daily. 30 each 0 09/16/2022 at 0815   Potassium 99 MG TABS Take 1 tablet by mouth daily.   09/16/2022 at 0815   rOPINIRole (REQUIP) 0.25 MG tablet Take 1 tablet (0.25 mg total) by mouth at bedtime. (Patient taking differently: Take 0.5 mg by mouth at bedtime.) 30 tablet 0 09/16/2022 at 1934   senna-docusate (SENOKOT-S) 8.6-50 MG tablet Take 1 tablet by mouth 2 (two) times daily. Hold if having loose or frequent stools. 30 tablet 0 09/16/2022 at 1805   sertraline (ZOLOFT) 50 MG tablet Take 1 tablet (50 mg total) by mouth daily. 30 tablet 3 09/16/2022 at 0815   vitamin B-12 (CYANOCOBALAMIN) 1000 MCG tablet Take 1,000 mcg by mouth daily.   09/16/2022 at 0815   zinc gluconate 50 MG tablet Take 50 mg by mouth daily.   09/16/2022 at 0958   feeding supplement (ENSURE ENLIVE / ENSURE PLUS) LIQD Take 237 mLs by mouth 3 (three) times daily between meals. 21330 mL 0    sodium chloride flush (NS) 0.9 % SOLN Inject 3 mLs into the vein every 12 (twelve) hours. 100 mL 0    Spacer/Aero-Holding Chambers DEVI 1 each by Does  not apply route as needed. 1 each 0     Social History   Socioeconomic History   Marital status: Widowed    Spouse name: Not on file   Number of children: Not on file   Years of education: Not on file   Highest education level: Not on file  Occupational History   Not on file  Tobacco Use   Smoking status: Never   Smokeless tobacco: Never  Vaping Use   Vaping Use: Never used  Substance and Sexual Activity   Alcohol use: No   Drug use: No   Sexual activity: Yes  Other Topics Concern   Not on file  Social History Narrative   Not on file   Social Determinants of Health   Financial Resource Strain: Not on file  Food Insecurity: No Food Insecurity (09/04/2022)   Hunger Vital Sign    Worried About Running Out of Food in the Last Year: Never true    Ran Out of Food in the Last Year: Never true  Transportation Needs: No Transportation Needs (08/05/2022)   PRAPARE - Administrator, Civil Service (Medical): No    Lack of Transportation (Non-Medical): No  Physical Activity: Not on file  Stress: Not on file  Social Connections: Not on file  Intimate Partner Violence: Not At Risk (08/05/2022)   Humiliation, Afraid, Rape, and Kick questionnaire    Fear of Current or Ex-Partner: No    Emotionally Abused: No    Physically Abused: No    Sexually Abused: No    Family History  Problem Relation Age of Onset   Hypertension Mother    Heart attack Mother    Rheum arthritis Mother    Osteoarthritis Mother    Cancer Father    Hypertension Father    Stroke Father    Breast cancer Maternal Grandmother 40   Osteoarthritis Other    Rheum arthritis Other       Intake/Output Summary (Last 24 hours) at 09/21/2022 1323 Last data filed at 09/20/2022 1445 Gross per 24 hour  Intake 0 ml  Output --  Net 0 ml     Vitals:   09/20/22 1542 09/20/22 2339 09/21/22 0614 09/21/22 0749  BP: (!) 144/69 133/71  (!) 157/80  Pulse: 77 72  79  Resp: 18 16  18   Temp: 98.2 F (36.8 C)  97.7 F (36.5 C)  97.6 F (36.4 C)  TempSrc: Oral     SpO2: 100% 100%  100%  Weight:   60.4 kg   Height:        PHYSICAL EXAM General: pleasant elderly Caucasian female, sitting upright in bed with no family present.  HEENT:  Normocephalic and atraumatic. Neck:  No JVD.  Lungs: Normal respiratory effort on room air, trace bibasilar crackles with overall improvement.   Heart: Regular rate, regular rhythm. Normal S1 and S2, 3/6 holosystolic murmur best heard at the apex. Abdomen: Non-distended appearing.  Msk: Normal strength and tone for age. Extremities: Warm and well perfused. No clubbing, cyanosis. No bilateral lower extremity edema.  Neuro: Alert and oriented today Psych:  Answers questions appropriately.   Labs: Basic Metabolic Panel: Recent Labs    09/20/22 0730 09/21/22 0500  NA 140 142  K 4.0 3.5  CL 105 106  CO2 23 23  GLUCOSE 71 59*  BUN 33* 33*  CREATININE 1.38* 1.39*  CALCIUM 8.6* 8.5*  MG 2.4 2.3  PHOS 3.2 3.8    Liver Function Tests: Recent Labs    09/20/22 0730 09/21/22 0500  AST 102* 68*  ALT 5 <5  ALKPHOS 199* 185*  BILITOT 1.0 1.1  PROT 5.7* 6.0*  ALBUMIN 3.0* 3.0*    No results for input(s): "LIPASE", "AMYLASE" in the last 72 hours. CBC: Recent Labs    09/20/22 0730 09/21/22 0500  WBC 7.9 9.8  NEUTROABS 5.9 7.6  HGB 8.5* 9.2*  HCT 26.4* 29.1*  MCV 94.3 95.7  PLT 238 269    Cardiac Enzymes: No results for input(s): "CKTOTAL", "CKMB", "CKMBINDEX", "TROPONINIHS" in the last 72 hours.  BNP: No results for input(s): "BNP" in the last 72 hours.  D-Dimer: No results for input(s): "DDIMER" in the last 72 hours. Hemoglobin A1C: No results for input(s): "HGBA1C" in the last 72 hours. Fasting Lipid Panel: Recent Labs    09/19/22 0845  CHOL 89  HDL 53  LDLCALC 23  TRIG 67  CHOLHDL 1.7    Thyroid Function Tests: No results for input(s): "TSH", "T4TOTAL", "T3FREE", "THYROIDAB" in the last 72 hours.  Invalid input(s):  "FREET3" Anemia Panel: No results for input(s): "VITAMINB12", "FOLATE", "FERRITIN", "TIBC", "IRON", "RETICCTPCT" in the last 72 hours.   Radiology: ECHOCARDIOGRAM LIMITED  Result Date: 09/17/2022    ECHOCARDIOGRAM LIMITED REPORT   Patient Name:   SAHIBA KILBANE Date of Exam: 09/17/2022 Medical Rec #:  [id number]          Height:       65.0 in Accession #:    [id number]         Weight:       150.0 lb Date of Birth:  [birth year]           BSA:          1.750 m Patient Age:    82 years           BP:           147/90 mmHg Patient Gender: F                  HR:           105  bpm. Exam Location:  [facility] Procedure: Limited Echo and Limited Color Doppler Indications:     CHF  History:         Patient has prior history of Echocardiogram examinations, most                  recent 09/01/2022. CHF, Signs/Symptoms:Bacteremia and Syncope;                  Risk Factors:Dyslipidemia. DVT, CKD.  Sonographer:     Mikki Harbor Referring Phys:  [id number] [facility] MICHELLE Kemesha Mosey Diagnosing Phys: Marcina Millard MD IMPRESSIONS  1. Left ventricular ejection fraction, by estimation, is 35 to 40%. The left ventricle has moderately decreased function. The left ventricle has no regional wall motion abnormalities. The left ventricular internal cavity size was mildly dilated. Indeterminate diastolic filling due to E-A fusion.  2. Right ventricular systolic function is normal. The right ventricular size is normal.  3. Left atrial size was moderately dilated.  4. The mitral valve is normal in structure. Severe mitral valve regurgitation. No evidence of mitral stenosis.  5. The aortic valve is normal in structure. Aortic valve regurgitation is moderate to severe. No aortic stenosis is present.  6. The inferior vena cava is normal in size with greater than 50% respiratory variability, suggesting right atrial pressure of 3 mmHg. FINDINGS  Left Ventricle: Left ventricular ejection fraction, by estimation, is 35 to 40%. The left ventricle has  moderately decreased function. The left ventricle has no regional wall motion abnormalities. The left ventricular internal cavity size was mildly dilated. There is no left ventricular hypertrophy. Indeterminate diastolic filling due to E-A fusion. Right Ventricle: The right ventricular size is normal. No increase in right ventricular wall thickness. Right ventricular systolic function is normal. Left Atrium: Left atrial size was moderately dilated. Right Atrium: Right atrial size was normal in size. Pericardium: There is no evidence of pericardial effusion. Mitral Valve: The mitral valve is normal in structure. Severe mitral valve regurgitation. No evidence of mitral valve stenosis. MV peak gradient, 14.3 mmHg. The mean mitral valve gradient is 5.0 mmHg. Tricuspid Valve: The tricuspid valve is normal in structure. Tricuspid valve regurgitation is not demonstrated. No evidence of tricuspid stenosis. Aortic Valve: The aortic valve is normal in structure. Aortic valve regurgitation is moderate to severe. No aortic stenosis is present. Pulmonic Valve: The pulmonic valve was normal in structure. Pulmonic valve regurgitation is not visualized. No evidence of pulmonic stenosis. Aorta: The aortic root is normal in size and structure. Venous: The inferior vena cava is normal in size with greater than 50% respiratory variability, suggesting right atrial pressure of 3 mmHg. IAS/Shunts: No atrial level shunt detected by color flow Doppler. LEFT VENTRICLE PLAX 2D LVIDd:         5.00 cm LVIDs:         3.70 cm LV PW:         0.80 cm LV IVS:        0.70 cm LVOT diam:     2.00 cm LVOT Area:     3.14 cm  LV Volumes (MOD) LV vol d, MOD A2C: 99.0 ml LV vol d, MOD A4C: 80.2 ml LV vol s, MOD A2C: 52.2 ml LV vol s, MOD A4C: 55.4 ml LV SV MOD A2C:     46.8 ml LV SV MOD A4C:     80.2 ml LV SV MOD BP:      35.7 ml LEFT ATRIUM  Index LA diam:    4.60 cm 2.63 cm/m   AORTA Ao Root diam: 3.40 cm MITRAL VALVE MV Area (PHT): 9.73 cm        SHUNTS MV Peak grad:  14.3 mmHg      Systemic Diam: 2.00 cm MV Mean grad:  5.0 mmHg MV Vmax:       1.89 m/s MV Vmean:      98.4 cm/s MV Decel Time: 78 msec MR Peak grad:    136.0 mmHg MR Mean grad:    84.0 mmHg MR Vmax:         583.00 cm/s MR Vmean:        424.0 cm/s MR PISA:         1.57 cm MR PISA Eff ROA: 25 mm MR PISA Radius:  0.50 cm MV E velocity: 160.00 cm/s Marcina Millard MD Electronically signed by Marcina Millard MD Signature Date/Time: 09/17/2022/5:26:11 PM    Final    CT Angio Chest PE W and/or Wo Contrast  Result Date: 09/17/2022 CLINICAL DATA:  Respiratory distress. EXAM: CT ANGIOGRAPHY CHEST WITH CONTRAST TECHNIQUE: Multidetector CT imaging of the chest was performed using the standard protocol during bolus administration of intravenous contrast. Multiplanar CT image reconstructions and MIPs were obtained to evaluate the vascular anatomy. RADIATION DOSE REDUCTION: This exam was performed according to the departmental dose-optimization program which includes automated exposure control, adjustment of the mA and/or kV according to patient size and/or use of iterative reconstruction technique. CONTRAST:  40mL OMNIPAQUE IOHEXOL 350 MG/ML SOLN COMPARISON:  Chest radiograph 1 day prior.  CTA chest 08/05/2022. FINDINGS: Cardiovascular: There is adequate opacification of the pulmonary arteries to the segmental level. There is no evidence of pulmonary embolism. The main pulmonary artery is enlarged measuring up to 3.3 cm. There is mild calcified plaque in the nonaneurysmal thoracic aorta. The heart size is stable. There is a small pericardial effusion, slightly increased in size since the prior CT. A right upper extremity PICC is in place. The tip is obscured by contrast. Mediastinum/Nodes: The thyroid is unremarkable. The esophagus is grossly unremarkable. There is no mediastinal, hilar, or axillary lymphadenopathy. Lungs/Pleura: The trachea and central airways are patent. There are moderate-sized  bilateral pleural effusions, right larger than left, with adjacent atelectasis in the lower lobes. There is additional probable atelectasis in the lingula. There is no convincing interlobular septal thickening. There is no pneumothorax. Upper Abdomen: Cholecystectomy clips are noted. The imaged portions of the upper abdominal viscera are otherwise unremarkable. Musculoskeletal: There is no acute osseous abnormality or suspicious osseous lesion. The patient is status post vertebral augmentation at T8 through T10 with near-complete collapse of the T9 vertebral body. The appearance is unchanged. Mild compression deformity of the T12 vertebral body is unchanged. Review of the MIP images confirms the above findings. IMPRESSION: 1. No evidence of pulmonary embolism. 2. Moderate-sized bilateral pleural effusions, right larger than left, with adjacent atelectasis in the lower lobes. 3. Small pericardial effusion, slightly increased in size since the prior CT. 4. Enlarged main pulmonary artery suggesting pulmonary hypertension. Electronically Signed   By: Lesia Hausen M.D.   On: 09/17/2022 08:45   DG Chest Portable 1 View  Result Date: 09/17/2022 CLINICAL DATA:  Respiratory distress. EXAM: PORTABLE CHEST 1 VIEW COMPARISON:  09/02/2022 FINDINGS: Diffuse interstitial and hazy airspace opacity. Cardiomegaly. Small pleural effusions are likely. Right PICC with tip at the upper SVC. No pneumothorax. IMPRESSION: 1. Diffuse pulmonary opacity which could be edema or infection. 2. Low volume  chest with chronic cardiomegaly. Electronically Signed   By: Tiburcio Pea M.D.   On: 09/17/2022 06:01   MR BRAIN WO CONTRAST  Result Date: 09/03/2022 CLINICAL DATA:  Initial evaluation for mental status change, unknown cause. EXAM: MRI HEAD WITHOUT CONTRAST TECHNIQUE: Multiplanar, multiecho pulse sequences of the brain and surrounding structures were obtained without intravenous contrast. COMPARISON:  Prior CT from 09/01/2022. FINDINGS:  Brain: Cerebral volume within normal limits for age. Patchy T2/FLAIR hyperintensity involving the periventricular deep white matter both cerebral hemispheres, consistent with chronic small vessel ischemic disease, mild for age. No evidence for acute or subacute infarct. Gray-white matter differentiation maintained. No areas of chronic cortical infarction. No acute or chronic intracranial blood products. No mass lesion, midline shift or mass effect. No hydrocephalus or extra-axial fluid collection. Pituitary gland and suprasellar region within normal limits. Vascular: Major intracranial vascular flow voids are maintained. Mild torcular inversion noted. Skull and upper cervical spine: Craniocervical junction within normal limits. Bone marrow signal intensity normal. No scalp soft tissue abnormality. Sinuses/Orbits: Prior bilateral ocular lens replacement. Paranasal sinuses are largely clear. No significant mastoid effusion. Other: None. IMPRESSION: 1. No acute intracranial abnormality. 2. Mild chronic microvascular ischemic disease for age. Electronically Signed   By: Rise Mu M.D.   On: 09/03/2022 01:52   ECHOCARDIOGRAM LIMITED  Result Date: 09/02/2022    ECHOCARDIOGRAM LIMITED REPORT   Patient Name:   PATSEY PONTORIERO Date of Exam: 09/01/2022 Medical Rec #:  [id number]          Height:       65.0 in Accession #:    [id number]         Weight:       121.3 lb Date of Birth:  26-Jan-1941           BSA:          1.599 m Patient Age:    82 years           BP:           97/48 mmHg Patient Gender: F                  HR:           64 bpm. Exam Location:  [facility] Procedure: 2D Echo, Cardiac Doppler, Limited Echo and Limited Color Doppler Indications:     R55 Syncope  History:         Patient has prior history of Echocardiogram examinations, most                  recent 08/09/2022. Arrythmias:Palpitations; Risk                  Factors:Dyslipidemia.  Sonographer:     Daphine Deutscher RDCS Referring Phys:   [id number] Verdene Lennert Diagnosing Phys: Alwyn Pea MD IMPRESSIONS  1. Left ventricular ejection fraction, by estimation, is 50 to 55%. The left ventricle has low normal function. The left ventricle demonstrates regional wall motion abnormalities (see scoring diagram/findings for description). Left ventricular diastolic  parameters were normal.  2. Right ventricular systolic function is normal. The right ventricular size is normal.  3. Left atrial size was mildly dilated.  4. A small pericardial effusion is present.  5. The mitral valve is normal in structure. Trivial mitral valve regurgitation.  6. The aortic valve is calcified. Aortic valve regurgitation is mild to moderate. Aortic valve sclerosis/calcification is present, without any evidence of aortic stenosis. FINDINGS  Left Ventricle: Left ventricular ejection  fraction, by estimation, is 50 to 55%. The left ventricle has low normal function. The left ventricle demonstrates regional wall motion abnormalities. The left ventricular internal cavity size was normal in size. There is no left ventricular hypertrophy. Left ventricular diastolic parameters were normal. Right Ventricle: The right ventricular size is normal. No increase in right ventricular wall thickness. Right ventricular systolic function is normal. Left Atrium: Left atrial size was mildly dilated. Right Atrium: Right atrial size was not assessed. Pericardium: A small pericardial effusion is present. Mitral Valve: The mitral valve is normal in structure. Trivial mitral valve regurgitation. Tricuspid Valve: The tricuspid valve is normal in structure. Tricuspid valve regurgitation is mild. Aortic Valve: The aortic valve is calcified. Aortic valve regurgitation is mild to moderate. Aortic regurgitation PHT measures 572 msec. Aortic valve sclerosis/calcification is present, without any evidence of aortic stenosis. Pulmonic Valve: The pulmonic valve was normal in structure. Pulmonic valve  regurgitation is not visualized. Aorta: The ascending aorta was not well visualized. IAS/Shunts: The interatrial septum was not well visualized. Additional Comments: There is no pleural effusion.  LEFT VENTRICLE PLAX 2D LVIDd:         4.10 cm Diastology LVIDs:         3.00 cm LV e' medial:    6.14 cm/s LV PW:         1.00 cm LV E/e' medial:  10.7 LV IVS:        1.00 cm LV e' lateral:   8.22 cm/s                        LV E/e' lateral: 8.0  RIGHT VENTRICLE             IVC RV S prime:     13.40 cm/s  IVC diam: 1.10 cm TAPSE (M-mode): 1.8 cm LEFT ATRIUM         Index LA diam:    4.60 cm 2.88 cm/m  AORTIC VALVE AI PHT:      572 msec  AORTA Ao Root diam: 3.40 cm MITRAL VALVE               TRICUSPID VALVE MV Area (PHT): 2.95 cm    TR Peak grad:   14.7 mmHg MV Decel Time: 257 msec    TR Vmax:        192.00 cm/s MV E velocity: 65.50 cm/s MV A velocity: 90.70 cm/s MV E/A ratio:  0.72 Dwayne D Callwood MD Electronically signed by Alwyn Pea MD Signature Date/Time: 09/02/2022/7:27:49 AM    Final    DG Chest Port 1 View  Result Date: 09/02/2022 CLINICAL DATA:  PICC placement EXAM: PORTABLE CHEST 1 VIEW COMPARISON:  Yesterday FINDINGS: Right upper extremity PICC with tip at the SVC. Cardiomegaly. Streaky opacity in the bilateral lungs. No visible effusion or pneumothorax. Chronic cardiomegaly. Multilevel cement augmentation in the thoracic spine. Gas distended upper abdominal colon. IMPRESSION: 1. Right upper extremity PICC with tip at the SVC. 2. Cardiomegaly and borderline vascular congestion. Atelectatic type opacity in the bilateral lungs. Electronically Signed   By: Tiburcio Pea M.D.   On: 09/02/2022 04:34   US RENAL  Result Date: 09/01/2022 CLINICAL DATA:  [id number] AKI (acute kidney injury) [id number] EXAM: RENAL / URINARY TRACT ULTRASOUND COMPLETE COMPARISON:  11/04/2017 FINDINGS: Right Kidney: Renal measurements: 9.6 x 4.5 x 4.6 cm = volume: 102 mL. Parenchyma echogenic compared to adjacent liver. No focal  lesion or hydronephrosis. Left Kidney: Renal measurements: 8.2  x 4.2 x 3.7 cm = volume: 67 mL. No hydronephrosis. Exophytic 3.1 x 2.7 x 2.9 cm cyst from lower pole as before. Bladder: Incompletely distended.  Bilateral ureteral jets demonstrated. Other: Bilateral pleural effusions incidentally noted. IMPRESSION: 1. No hydronephrosis. 2. Echogenic renal parenchyma suggesting medical renal disease. 3. 3.1 cm left renal cyst. 4. Bilateral pleural effusions. Electronically Signed   By: Corlis Leak M.D.   On: 09/01/2022 18:35   DG Chest Portable 1 View  Result Date: 09/01/2022 CLINICAL DATA:  Recent pneumonia EXAM: PORTABLE CHEST 1 VIEW COMPARISON:  08/07/2022 FINDINGS: Previously noted bibasilar and right lung opacities are improved, with some residual opacities in the mid to lower right lung and lower left lung, which are somewhat linear and may reflect atelectasis or infection. Unchanged cardiac and mediastinal contours. Aortic atherosclerosis. No pleural effusion or pneumothorax. No acute osseous abnormality. Prior ACDF. IMPRESSION: Previously noted bibasilar and right lung opacities are improved, with some residual opacities in the mid to lower right lung and lower left lung, which may reflect atelectasis and/or infection. Electronically Signed   By: Wiliam Ke M.D.   On: 09/01/2022 15:26   CT HEAD WO CONTRAST ( )  Result Date: 09/01/2022 CLINICAL DATA:  Trauma EXAM: CT HEAD WITHOUT CONTRAST TECHNIQUE: Contiguous axial images were obtained from the base of the skull through the vertex without intravenous contrast. RADIATION DOSE REDUCTION: This exam was performed according to the departmental dose-optimization program which includes automated exposure control, adjustment of the mA and/or kV according to patient size and/or use of iterative reconstruction technique. COMPARISON:  None Available. FINDINGS: Brain: No evidence of acute infarction, hemorrhage, hydrocephalus, extra-axial collection or mass  lesion/mass effect. Sequela of moderate chronic microvascular ischemic change. Vascular: No hyperdense vessel or unexpected calcification. Skull: Normal. Negative for fracture or focal lesion. Sinuses/Orbits: No mastoid or middle ear effusion. Paranasal sinuses are notable for frothy secretions in the bilateral sphenoid sinuses, which can be seen in the setting sinusitis. Bilateral lens replacement. Orbits are otherwise unremarkable. Other: None. IMPRESSION: 1. No acute intracranial abnormality. 2. Frothy secretions in the bilateral sphenoid sinuses can be seen in the setting of acute sinusitis. Electronically Signed   By: Lorenza Cambridge M.D.   On: 09/01/2022 14:36     TELEMETRY reviewed by me [facility]) 09/21/2022 : Sinus rhythm rate 80-90s  EKG reviewed by me: Sinus tachycardia rate 115 with T wave flattening  Data reviewed by me [facility]) 09/21/2022: Hospitalist progress note, nursing notes last 24h vitals tele labs imaging I/O    Active Problems:   Gout   HLD (hyperlipidemia)   Anemia due to stage 3 chronic kidney disease (HCC)   Hypothyroidism   Rheumatoid arthritis (HCC)   CKD (chronic kidney disease), stage IV (HCC)   Elevated troponin   Acute deep vein thrombosis (DVT) of calf muscle vein of right lower extremity (HCC)   MSSA bacteremia   Acute respiratory failure with hypoxia (HCC)   Acute on chronic diastolic CHF (congestive heart failure) (HCC)   Obstructive lung disease (generalized) (HCC)   Hypokalemia   Delirium due to another medical condition   Dementia without behavioral disturbance (HCC)    ASSESSMENT AND PLAN:  Sydney Devon "[name]" is an 81yoF with a PMH of multiple myeloma (dx 2012 s/p stem cell transplant, relapse 2021, ongoing tx), HFpEF, moderate MR, recent DVT (currently taking Eliquis) CKD 4, RA, palpitations who presented to [facility] ED from a rehab facility on 09/17/2022 in respiratory distress.  Cardiology is consulted for further  assistance.  # Delirium Per  granddaughter, she has a history of confusion during her previous hospitalizations.  Suspect hospital delirium, neuroexam is nonfocal.  Recommend frequent reorientation and minimal disturbances in the evening. -Waxing and waning mental status throughout admission  # Acute on chronic HFrEF # Moderate-severe mitral regurgitation BNP markedly elevated at 3200 with pleural effusions bilaterally on chest x-ray and CTA chest.  Clinically hypervolemic on exam with crackles, JVD, with significant peripheral edema, clinical improvement after initial IV diuresis.  Suspect some decompensation in part from stopping her diuretic within the past 10 days and some continued reduction in her EF with multiple severe illnesses requiring hospitalization over the past couple months.  Her MR is moderate to severe by echo this admission. -S/p IV diuresis, Cr plateauing.  Will consider addition of p.o. torsemide once daily tomorrow if her renal function is stable -Monitor and replenish electrolytes for goal K >4, mag >2 -Consolidate metoprolol to 75 mg XL dosing tomorrow.  Do not restart PTA atenolol -Start losartan 12.5 mg daily, ARNI cost prohibitive -Further GDMT as BP and renal function tolerate with MRA, SGLT2i -Will need close clinic follow-up outpatient for further surveillance of her MR  # Sinus tachycardia  In the setting of respiratory distress and hypoxia, also possible rebound tachycardia after stopping atenolol abruptly after her last hospital discharge.  Will start metoprolol as above, and continue Eliquis 5 mg twice daily as treatment of her recent DVT as below.  repeat EKG reviewed by myself and Dr. Darrold Junker demonstrate sinus tachycardia - Continue telemetry monitoring. Patient HR improved to 80-90s this AM.  -Continue Metoprolol as above  -Monitor and replenish electrolytes as above  # Recent MSSA bacteremia PICC line present, antibiotics per primary/infectious disease  # History of DVT Eliquis 5 mg  twice daily as above  # Demand ischemia Borderline elevated and flat trending, in the absence of ischemic EKG changes and in the setting of acute on chronic CHF, this is most consistent with demand/supply mismatch and not ACS   This patient's plan of care was discussed and created with Dr. Darrold Junker and he is in agreement.  Signed: Rebeca Allegra , PA-C 09/21/2022, 1:23 PM [facility]

## 2022-09-21 NOTE — Progress Notes (Signed)
Sydney Gillespie:[id number] DOB: January 18, 1941 DOA: 09/17/2022 PCP: Barbette Reichmann, MD   Subj: LESLYN Gillespie is a 82 y.o. WF PMHx recent MSSA bacteremia on cefazolin, multiple myeloma, CKD stage III, obstructive lung disease, Chronic Systolic CHF (LVEF 35 to 40%), HTN, HLD, RA, lumbar DJD, Hx of DVT on Apixaban, Anemia, TB  Presenting with acute respiratory failure with hypoxia, volume overload.  Patient noted to have been recently admitted April 23 through April 29 for acute on chronic kidney disease.  Received IV hydration and discharge.  Per the granddaughter, patient has had worsening lower extremity swelling since discharge.  Has been taking outpatient diuretics still with lower extremity swelling.  Compression hose were not placed at facility per report.  Compression hose were then placed recently with the past 1 to 2 days with patient developing significant shortness of breath.  No fevers or chills.  No nausea or vomiting.  No chest pain.  No abdominal pain.  Developed significant shortness of breath over the past 12 to 24 hours.  No reported NSAID use or high salt intake. Presented to the ER afebrile, heart rate in the 120s, initially requiring BiPAP.  Given 60 mg IV Lasix with patient being transition to 45 L nasal cannula.  White count 10, hemoglobin 8, troponin 26-31, COVID negative, creatinine 0.92.  Potassium 3.0, BNP 3282.  EKG sinus tach.  CTA of the chest negative for PE but does show moderate-sized bilateral pleural effusions right greater than left, small pericardial effusion as well as findings concerning for pulmonary hypertension   Obj: 5/13 afebrile overnight A/O x 4, patient very interactive sitting comfortably eating a rib dinner conversing with her granddaughter.  Per granddaughter back to baseline.   Objective: VITAL SIGNS: Temp: 97.6 F (36.4 C) (05/13 0749) BP: 157/80 (05/13 0749) Pulse Rate: 79 (05/13 0749)   VENTILATOR SETTINGS: Nasal cannula 5/13 Flow 2  L/min SpO2 100%  Procedures/Significant Events: 3/29 MRI T-spine W/W. Wo contrast Status post augmentation at T8, T9 and T10 without progression of height loss. 2. 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 Small amount of ventral epidural enhancing material may be dilated venous plexus or small epidural abscess/phlegmon. 3. Moderate T9 spinal canal stenosis. ----------------------------------------------------------------------------------------------------------------------------------------------0 5/9 echocardiogram limited  Left Ventricle: LVEF= 35 to 40%. The left ventricle has moderately decreased function. -Indeterminate diastolic  Left Atrium: moderately dilated.  Mitral Valve:  Severe mitral valve regurgitation.  Aortic Valve: regurgitation is moderate to severe.  5/9 CTA PE protocol  Moderate-sized bilateral pleural effusions, right larger than left, with adjacent atelectasis in the lower lobes. 3. Small pericardial effusion, slightly increased in size since the prior CT. 4. Enlarged main pulmonary artery suggesting pulmonary hypertension.   Consultants:  Cardiology Dr.Jayashree Ravishankar ID, Palliative care   Cultures 3/27 blood positive MSSA  ---------------------------------------------------------------------------------------------------------------------------------------------------- 5/9 blood RIGHT hand NGTD 5/9 blood RIGHT arm NGTD    Antimicrobials: Anti-infectives (From admission, onward)    Start     Ordered Stop   09/19/22 1000  hydroxychloroquine (PLAQUENIL) tablet 200 mg        09/17/22 1006     09/17/22 1030  hydroxychloroquine (PLAQUENIL) tablet 200 mg       Note to Pharmacy: Patient currently takes 200 mg twice daily Monday through Friday and 200 mg once daily Saturday and Sunday.   09/17/22 0949     09/17/22 1015  ceFAZolin (ANCEF) IVPB 2g/100 mL premix        05 /09/24 1009 09/17/22 2200   09/17/22 1000  ceFAZolin (ANCEF)  IVPB  Status:  Discontinued       Note to Pharmacy: Indication:  MSSA bacteremia and discitis First Dose: Yes Last Day of Therapy:  09/17/2022 Labs - Once weekly:  CBC/D, CMP, ESR and CRP Please pull PIC at completion of IV antibiotics Fax weekly lab results  promptly to [phone] Method of administration: IV Push Method of administration m   09/17/22 0949 09/17/22 1009        Intake/Output Summary (Last 24 hours) at 09/21/2022 0925 Last data filed at 09/20/2022 1445 Gross per 24 hour  Intake 100 ml  Output --  Net 100 ml    Physical Exam:  General: A/O x 4, No acute respiratory distress, cachectic Eyes: negative scleral hemorrhage, negative anisocoria, negative icterus ENT: Negative Runny nose, negative gingival bleeding, Neck:  Negative scars, masses, torticollis, lymphadenopathy, JVD Lungs: Clear to auscultation bilaterally without wheezes or crackles Cardiovascular: Regular rate and rhythm without murmur gallop or rub normal S1 and S2 Abdomen: negative abdominal pain, nondistended, positive soft, bowel sounds, no rebound, no ascites, no appreciable mass Extremities: No significant cyanosis, clubbing, or edema bilateral lower extremities Skin: Negative rashes, lesions, ulcers Psychiatric:  Negative depression, negative anxiety, negative fatigue, negative mania  Central nervous system:  Cranial nerves II through XII intact, tongue/uvula midline, all extremities muscle strength 5/5, sensation intact throughout,  negative dysarthria, negative expressive aphasia, negative receptive aphasia.     DVT prophylaxis: Apixaban Code Status:  Family Communication: 5/13 granddaughter Sydney [facility]) at bedside for discussion of plan of care all questions answered       Dispo: The patient is from: SNF              Anticipated d/c is to: SNF              Anticipated d/c date is: > 3 days              Patient currently is not medically stable to d/c.      Assessment &  Plan: Covid vaccination;   Active Problems:   Acute respiratory failure with hypoxia (HCC)   Acute on chronic diastolic CHF (congestive heart failure) (HCC)   CKD (chronic kidney disease), stage IV (HCC)   Elevated troponin   Gout   MSSA bacteremia   HLD (hyperlipidemia)   Anemia due to stage 3 chronic kidney disease (HCC)   Hypothyroidism   Rheumatoid arthritis (HCC)   Acute deep vein thrombosis (DVT) of calf muscle vein of right lower extremity (HCC)   Obstructive lung disease (generalized) (HCC)   Hypokalemia   Delirium due to another medical condition   Dementia without behavioral disturbance (HCC)   Acute Systolic CHF (current LVEF 35 to 40%) -April 2024 echocardiogram LVEF 50 to 55% -Acute respiratory failure with volume overload with noted BNP of 3300 -Reach out to cardiology for consultation -Strict in and out +137ml - Daily weight Filed Weights   09/17/22 0525 09/19/22 0725 09/21/22 0614  Weight: 68 kg 61.8 kg 60.4 kg  -5/10 Lasix IV 60 mg BID---> decrease to 40mg  daily per cardiology recommendation  -5/10 Metoprolol 25 mg BID -5/11 Lasix dose held today per cardiology recommendation. -5/13 Losartan 12.5 mg daily per cardiology -5/13 Toprol 75 mg daily (start date 5/14), DC Metoprolol per cardiology  Elevated troponin Troponins 20s to 30s in the setting of volume overload Suspect secondary heart strain EKG grossly stable Follow  Hypoalbuminemia -5/10 albumin 50 g x 1   Acute respiratory failure with hypoxia (HCC) -Decompensated  respiratory status initially requiring BiPAP-now on 2 L in the setting of flash pulmonary edema and volume overload -CTA chest negative for PE, + moderate bilateral pleural effusions and small pericardial effusion, ? Pulm HTN  -Lasix IV 60 mg BID -5/10 per cardiology    -Decrease Lasix IV 60 mg daily  -Optimize HF medication  -MR is worse than previous at mod-severe.     CKD (chronic kidney disease), stage IV (Baseline creatinine  1-1.7) -If above is truly her baseline creatinine now stage IV CKD.   -Will continue to diurese secondary to above and determine what baseline creatinine actually is. Lab Results  Component Value Date   CREATININE 1.39 (H) 09/21/2022   CREATININE 1.38 (H) 09/20/2022   CREATININE 1.21 (H) 09/19/2022   CREATININE 1.09 (H) 09/18/2022   CREATININE 0.92 09/17/2022  -5/11 mildly elevated see CHF -5/12 at baseline but trending up, continue to monitor off Lasix   Hx MSSA bacteremia -Diagnosed 3/27 has been on antibiotics since. -On IV cefazolin with today May 9 being the last day of treatment.  However will continue until obtain ID consult in a.m. given that repeat labs are pending to see if she is actually clear infection. 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  See MSSA bacteremia  Obstructive lung disease (generalized) (HCC) -Stable, SOB secondary to fluid overload. - See acute respiratory failure with hypoxia   Acute deep vein thrombosis (DVT) of calf muscle vein of right lower extremity (HCC) -Continue Eliquis   Rheumatoid arthritis (HCC) -Continue Plaquenil    Hypothyroidism -Synthroid 137 mcg daily   Anemia due to CKD stage III (baseline HgB~8) -Transfuse for hemoglobin<7  -5/11 transfuse 1 unit PRBC Lab Results  Component Value Date   HGB 9.2 (L) 09/21/2022   HGB 8.5 (L) 09/20/2022   HGB 7.2 (L) 09/19/2022   HGB 7.0 (L) 09/19/2022   HGB 8.3 (L) 09/18/2022   HLD (hyperlipidemia) - 5/11 LDL= within CHF guidelines - 5/11 Lipitor 40 mg daily  Dementia without behavioral disturbance/hospital delirium -5/12 patient having waxing and waning mentation.  At times hallucinating. - 5/12 decrease Xanax 0.25 mg to BID PRN  Insomnia - 5/12 increase Zyprexa 15 mg qhs  Gout -Allopurinol 300 mg daily    Hypokalemia -Potassium goal> 4  -5/13 K-Dur 20 mEq  Hypocalcemia - Calcium goal> 8.9    Goals of care - 5/11 palliative care consult: Worsening systolic CHF, as well as additional multiple medical problems consult for palliative care at home vs hospice.  Granddaughter Youth worker) requests that you set up appointment via phone or in person consult with her and grandfather being present.    Mobility Assessment (last 72 hours)     Mobility Assessment     Row Name 09/20/22 2000 09/20/22 0946 09/19/22 2200 09/18/22 2106     Does patient have an order for bedrest or is patient medically unstable No - Continue assessment No - Continue assessment No - Continue assessment No - Continue assessment    What is the highest level of mobility based on the progressive mobility assessment? Level 1 (Bedfast) - Unable to balance while sitting on edge of bed Level 1 (Bedfast) - Unable to balance while sitting on edge of bed Level 1 (Bedfast) - Unable to balance while sitting on edge of bed Level 3 (Stands with assist) - Balance while standing  and cannot march in place  Is the above level different from baseline mobility prior to current illness? -- -- Yes - Recommend PT order Yes - Recommend PT order                  Time: 50 minutes         Care during the described time interval was provided by me .  I have reviewed this patient's available data, including medical history, events of note, physical examination, and all test results as part of my evaluation.

## 2022-09-21 NOTE — Care Management Important Message (Signed)
Important Message  Patient Details  Name: Sydney Gillespie MRN: [id number] Date of Birth: [birth year]   Medicare Important Message Given:  N/A - LOS <3 / Initial given by admissions     Sydney Gillespie 09/21/2022, 8:45 AM

## 2022-09-22 DIAGNOSIS — R7881 Bacteremia: Secondary | ICD-10-CM | POA: Diagnosis not present

## 2022-09-22 DIAGNOSIS — M109 Gout, unspecified: Secondary | ICD-10-CM | POA: Diagnosis not present

## 2022-09-22 DIAGNOSIS — J449 Chronic obstructive pulmonary disease, unspecified: Secondary | ICD-10-CM | POA: Diagnosis not present

## 2022-09-22 DIAGNOSIS — J9601 Acute respiratory failure with hypoxia: Secondary | ICD-10-CM | POA: Diagnosis not present

## 2022-09-22 DIAGNOSIS — Z515 Encounter for palliative care: Secondary | ICD-10-CM | POA: Diagnosis not present

## 2022-09-22 DIAGNOSIS — I5033 Acute on chronic diastolic (congestive) heart failure: Secondary | ICD-10-CM | POA: Diagnosis not present

## 2022-09-22 LAB — CBC WITH DIFFERENTIAL/PLATELET
Abs Immature Granulocytes: 0.08 10*3/uL — ABNORMAL HIGH (ref 0.00–0.07)
Basophils Absolute: 0.1 10*3/uL (ref 0.0–0.1)
Basophils Relative: 1 %
Eosinophils Absolute: 0 10*3/uL (ref 0.0–0.5)
Eosinophils Relative: 0 %
HCT: 27 % — ABNORMAL LOW (ref 36.0–46.0)
Hemoglobin: 8.6 g/dL — ABNORMAL LOW (ref 12.0–15.0)
Immature Granulocytes: 1 %
Lymphocytes Relative: 17 %
Lymphs Abs: 1.2 10*3/uL (ref 0.7–4.0)
MCH: 30.4 pg (ref 26.0–34.0)
MCHC: 31.9 g/dL (ref 30.0–36.0)
MCV: 95.4 fL (ref 80.0–100.0)
Monocytes Absolute: 0.7 10*3/uL (ref 0.1–1.0)
Monocytes Relative: 10 %
Neutro Abs: 5 10*3/uL (ref 1.7–7.7)
Neutrophils Relative %: 71 %
Platelets: 268 10*3/uL (ref 150–400)
RBC: 2.83 MIL/uL — ABNORMAL LOW (ref 3.87–5.11)
RDW: 18.7 % — ABNORMAL HIGH (ref 11.5–15.5)
WBC: 7 10*3/uL (ref 4.0–10.5)
nRBC: 0.3 % — ABNORMAL HIGH (ref 0.0–0.2)

## 2022-09-22 LAB — CULTURE, BLOOD (ROUTINE X 2): Culture: NO GROWTH

## 2022-09-22 LAB — COMPREHENSIVE METABOLIC PANEL
ALT: <5 U/L (ref 0–44)
AST: 54 U/L — ABNORMAL HIGH (ref 15–41)
Albumin: 2.7 g/dL — ABNORMAL LOW (ref 3.5–5.0)
Alkaline Phosphatase: 149 U/L — ABNORMAL HIGH (ref 38–126)
Anion gap: 7 (ref 5–15)
BUN: 31 mg/dL — ABNORMAL HIGH (ref 8–23)
CO2: 24 mmol/L (ref 22–32)
Calcium: 8.5 mg/dL — ABNORMAL LOW (ref 8.9–10.3)
Chloride: 109 mmol/L (ref 98–111)
Creatinine, Ser: 1.3 mg/dL — ABNORMAL HIGH (ref 0.44–1.00)
GFR, Estimated: 41 mL/min — ABNORMAL LOW (ref >60)
Glucose, Bld: 88 mg/dL (ref 70–99)
Potassium: 4.1 mmol/L (ref 3.5–5.1)
Sodium: 140 mmol/L (ref 135–145)
Total Bilirubin: 1.2 mg/dL (ref 0.3–1.2)
Total Protein: 5.5 g/dL — ABNORMAL LOW (ref 6.5–8.1)

## 2022-09-22 LAB — MAGNESIUM: Magnesium: 2.1 mg/dL (ref 1.7–2.4)

## 2022-09-22 LAB — PHOSPHORUS: Phosphorus: 2.6 mg/dL (ref 2.5–4.6)

## 2022-09-22 MED ORDER — TORSEMIDE 20 MG PO TABS
10.0000 mg | ORAL_TABLET | Freq: Every day | ORAL | Status: DC
  Administered 2022-09-22 – 2022-09-23 (×2): 10 mg via ORAL
  Filled 2022-09-22 (×2): qty 1

## 2022-09-22 NOTE — TOC Progression Note (Signed)
Transition of Care [facility]) - Progression Note    Patient Details  Name: Sydney Gillespie MRN: [id number] Date of Birth: 1941-02-02  Transition of Care [facility]) CM/SW Contact  Marlowe Sax, RN Phone Number: 09/22/2022, 12:02 PM  Clinical Narrative:    Sherron Monday with jennifer the patient's grand daughter, she stated that they are hoping that the [facility] could return to [facility]< I reached out to Tammy at [facility] and she stated that the patient can return FL2 [facility] and [facility] to [facility] The grand daughter requested  call from the doctor, I reached out to Dr Joseph Art and requested that he call her  She also requested that the Palliative NP stop by the patients room while the patients sister is there, I reached out to [place] and requested this TOC to continue to follow the patient        Expected Discharge Plan and Services                                               Social Determinants of Health (SDOH) Interventions SDOH Screenings   Food Insecurity: No Food Insecurity (09/04/2022)  Housing: Low Risk  (09/04/2022)  Transportation Needs: No Transportation Needs (08/05/2022)  Utilities: Not At Risk (08/05/2022)  Tobacco Use: Low Risk  (09/17/2022)    Readmission Risk Interventions    09/03/2022    3:44 PM 08/06/2022    3:47 PM 05/10/2022    2:56 PM  Readmission Risk Prevention Plan  Transportation Screening Complete Complete Complete  Medication Review (RN Care Manager) Complete Complete Complete  PCP or Specialist appointment within 3-5 days of discharge  Complete Complete  HRI or Home Care Consult Complete  Complete  SW Recovery Care/Counseling Consult Complete Complete Complete  Palliative Care Screening Complete Not Applicable Not Applicable  Skilled Nursing Facility Complete Not Applicable Not Applicable

## 2022-09-22 NOTE — [place] FL2 (Signed)
[place] MEDICAID FL2 LEVEL OF CARE FORM     IDENTIFICATION  Patient Name: Sydney Gillespie Birthdate: 1940-09-29 Sex: female Admission Date (Current Location): 09/17/2022  [facility] and [place] Number:  Chiropodist and Address:  [facility], [address], [place], [place] [address]      Provider Number: [id number]  Attending Physician Name and Address:  Drema Dallas, MD  Relative Name and Phone Number:  Charlott Holler daughter [phone]    Current Level of Care: Hospital Recommended Level of Care: Skilled Nursing Facility Prior Approval Number:    Date Approved/Denied:   PASRR Number: [id number]  Discharge Plan: SNF    Current Diagnoses: Patient Active Problem List   Diagnosis Date Noted   Delirium due to another medical condition 09/20/2022   Dementia without behavioral disturbance (HCC) 09/20/2022   Acute respiratory failure with hypoxia (HCC) 09/17/2022   Acute on chronic diastolic CHF (congestive heart failure) (HCC) 09/17/2022   Obstructive lung disease (generalized) (HCC) 09/17/2022   Hypokalemia 09/17/2022   Metabolic acidosis 09/04/2022   Orthostatic hypotension 09/04/2022   Hyponatremia 09/03/2022   Hypoglycemia 09/03/2022   Anemia of chronic disease 09/02/2022   Acute kidney injury superimposed on CKD (HCC) 09/01/2022   SIRS (systemic inflammatory response syndrome) (HCC) 09/01/2022   History of DVT (deep vein thrombosis) 09/01/2022   Discitis of thoracic region 08/10/2022   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   MSSA bacteremia 08/07/2022   CAP (community acquired pneumonia) 08/06/2022   Pneumonia 08/05/2022   Thoracic compression fracture, with delayed healing, subsequent encounter 07/29/2022   Acute deep vein thrombosis (DVT) of calf muscle vein of right lower extremity (HCC) 05/10/2022   Compression fracture of body of thoracic vertebra (HCC) 05/07/2022   Pneumonia involving right lung 05/07/2022    Elevated troponin 05/07/2022   Anemia of chronic kidney failure 04/27/2022   Esophageal stenosis 04/26/2022   COVID-19 virus infection 04/25/2022   Multiple myeloma (HCC) 04/25/2022   Hypothyroidism 04/25/2022   Depression with anxiety 04/25/2022   Rheumatoid arthritis (HCC) 04/25/2022   Iron deficiency anemia 04/25/2022   CKD (chronic kidney disease), stage IV (HCC) 04/25/2022   Hyperparathyroidism due to renal insufficiency (HCC) 01/29/2020   Neuropathy 10/11/2019   Restless leg syndrome 10/11/2019   Syncope 09/01/2018   Pneumonia due to COVID-19 virus 09/01/2018   Anemia due to stage 3 chronic kidney disease (HCC) 12/28/2016   Lumbar radiculopathy 06/18/2016   Lethargy 01/29/2016   Low serum vitamin D 01/29/2016   Vitamin D deficiency, unspecified 01/29/2016   Multiple myeloma not having achieved remission (HCC) 12/18/2015   Spondylolisthesis of lumbar region 11/25/2015   Soft tissue lesion of shoulder region 09/11/2015   Acid reflux 09/11/2015   Lumbar and sacral osteoarthritis 07/22/2015   Degeneration of intervertebral disc of lumbar region 10/18/2013   Neuritis or radiculitis due to rupture of lumbar intervertebral disc 10/18/2013   Degenerative arthritis of lumbar spine 10/18/2013   Allergic rhinitis 09/27/2013   HLD (hyperlipidemia) 09/27/2013   Palpitations 09/27/2013   Gout 09/21/2013   Abnormal result of Mantoux test 09/21/2013   Impaired renal function 09/21/2013   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   OP (osteoporosis) 10/13/2011   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    Orientation RESPIRATION BLADDER Height & Weight     Self, Place  Normal Incontinent, External catheter Weight: 62.2 kg Height:  5\' 5"  (165.1 cm)  BEHAVIORAL SYMPTOMS/MOOD NEUROLOGICAL BOWEL NUTRITION STATUS      Continent Diet (dys 3)  AMBULATORY STATUS COMMUNICATION OF NEEDS Skin   Extensive Assist Verbally Normal                       Personal Care Assistance Level  of Assistance  Bathing, Feeding, Dressing Bathing Assistance: Maximum assistance Feeding assistance: Limited assistance Dressing Assistance: Maximum assistance     Functional Limitations Info  Sight, Hearing, Speech Sight Info: Adequate Hearing Info: Adequate Speech Info: Adequate    SPECIAL CARE FACTORS FREQUENCY  PT (By licensed PT), OT (By licensed OT)     PT Frequency: 5 times per week OT Frequency: 5 times per week            Contractures Contractures Info: Not present    Additional Factors Info  Code Status, Allergies Code Status Info: DNR Allergies Info: isoniazid           Current Medications (09/22/2022):  This is the current hospital active medication list Current Facility-Administered Medications  Medication Dose Route Frequency Provider Last Rate Last Admin   0.9 %  sodium chloride infusion  250 mL Intravenous PRN Floydene Flock, MD       acetaminophen (TYLENOL) tablet 650 mg  650 mg Oral Q6H PRN Floydene Flock, MD   650 mg at 09/20/22 0946   allopurinol (ZYLOPRIM) tablet 300 mg  300 mg Oral Daily Floydene Flock, MD   300 mg at 09/22/22 0846   ALPRAZolam Prudy Feeler) tablet 0.25 mg  0.25 mg Oral BID PRN Drema Dallas, MD   0.25 mg at 09/21/22 1820   apixaban (ELIQUIS) tablet 5 mg  5 mg Oral BID Floydene Flock, MD   5 mg at 09/22/22 0846   atorvastatin (LIPITOR) tablet 40 mg  40 mg Oral Daily Drema Dallas, MD   40 mg at 09/22/22 0846   cefadroxil (DURICEF) capsule 500 mg  500 mg Oral BID Drema Dallas, MD   500 mg at 09/22/22 1610   Chlorhexidine Gluconate Cloth 2 % PADS 6 each  6 each Topical Daily Floydene Flock, MD   6 each at 09/21/22 9604   cyanocobalamin (VITAMIN B12) tablet 1,000 mcg  1,000 mcg Oral Daily Floydene Flock, MD   1,000 mcg at 09/22/22 0846   feeding supplement (ENSURE ENLIVE / ENSURE PLUS) liquid 237 mL  237 mL Oral TID BM Floydene Flock, MD   237 mL at 09/21/22 2114   hydroxychloroquine (PLAQUENIL) tablet 200 mg  200 mg  Oral 2 times per day on Mon Tue Wed Thu Fri Floydene Flock, MD   200 mg at 09/22/22 5409   hydroxychloroquine (PLAQUENIL) tablet 200 mg  200 mg Oral Once per day on Sun Sat Drusilla Kanner, RPH   200 mg at 09/20/22 0945   ipratropium-albuterol (DUONEB) 0.5-2.5 (3) MG/3ML nebulizer solution 3 mL  3 mL Inhalation Q6H PRN Floydene Flock, MD   3 mL at 09/18/22 2017   levothyroxine (SYNTHROID) tablet 137 mcg  137 mcg Oral Q0600 Floydene Flock, MD   137 mcg at 09/22/22 0554   losartan (COZAAR) tablet 12.5 mg  12.5 mg Oral Daily Rebeca Allegra, PA-C   12.5 mg at 09/22/22 8119   magic mouthwash  5 mL Oral TID PRN Georgiann Cocker, FNP       metoprolol succinate (TOPROL-XL) 24 hr tablet 75 mg  75 mg Oral Daily Rebeca Allegra, PA-C   75 mg at 09/22/22 1478   multivitamin with minerals tablet  1 tablet  1 tablet Oral Daily Floydene Flock, MD   1 tablet at 09/22/22 0846   OLANZapine (ZYPREXA) tablet 15 mg  15 mg Oral QHS Drema Dallas, MD   15 mg at 09/21/22 2112   ondansetron (ZOFRAN) tablet 4 mg  4 mg Oral Q6H PRN Floydene Flock, MD       Or   ondansetron [facility]) injection 4 mg  4 mg Intravenous Q6H PRN Floydene Flock, MD       pantoprazole (PROTONIX) EC tablet 40 mg  40 mg Oral Daily Floydene Flock, MD   40 mg at 09/22/22 0846   polyethylene glycol (MIRALAX / GLYCOLAX) packet 17 g  17 g Oral Daily Floydene Flock, MD   17 g at 09/20/22 0946   rOPINIRole (REQUIP) tablet 0.25 mg  0.25 mg Oral QHS Floydene Flock, MD   0.25 mg at 09/21/22 2113   sertraline (ZOLOFT) tablet 50 mg  50 mg Oral Daily Floydene Flock, MD   50 mg at 09/22/22 0846   sodium chloride flush (NS) 0.9 % injection 3 mL  3 mL Intravenous Q12H Floydene Flock, MD   3 mL at 09/22/22 0849   sodium chloride flush (NS) 0.9 % injection 3 mL  3 mL Intravenous PRN Floydene Flock, MD       torsemide [facility]) tablet 10 mg  10 mg Oral Daily Rebeca Allegra, PA-C   10 mg at 09/22/22 1610   zinc sulfate capsule 220 mg   220 mg Oral Daily Floydene Flock, MD   220 mg at 09/22/22 9604     Discharge Medications: Please see discharge summary for a list of discharge medications.  Relevant Imaging Results:  Relevant Lab Results:   Additional Information ss [id number]  Marlowe Sax, RN

## 2022-09-22 NOTE — Care Management Important Message (Signed)
Important Message  Patient Details  Name: Sydney Gillespie MRN: [id number] Date of Birth: 1940-06-24   Medicare Important Message Given:  Yes  I reviewed the Important Message from Medicare with the patient's granddaughter, Julayne Shreiner by phone [phone] and she stated she understood her rights and I thanked her for her time.   Olegario Messier A Lakitha Gordy 09/22/2022, 3:18 PM

## 2022-09-22 NOTE — Progress Notes (Signed)
                                                     Palliative Care Progress Note, Assessment & Plan   Patient Name: Sydney Gillespie       Date: 09/22/2022 DOB: 06/30/1940  Age: 82 y.o. MRN#: [id number] Attending Physician: Drema Dallas, MD Primary Care Physician: Barbette Reichmann, MD Admit Date: 09/17/2022  Subjective: Patient is sitting up in bed in no apparent distress.  She acknowledges my presence and is able to make her wishes known.  She endorses she is tired and would like to take a nap.  No family or friends present during my visit.  Patient shares her sister was here earlier but has since left.  HPI: 82 y.o. female  with past medical history of recent MSSA bacteremia remains on Cefazolin, multiple myeloma, CKD stage III, obstructive lung disease, HTN, HLD, RA, history of DVT on apixaban admitted from [facility] on 09/17/2022 with acute respiratory failure with hypoxia and volume overload.   Noted recent admission 4/23-4/29 for acute on chronic kidney disease   Granddaughter reports increase in BLE edema since discharge on 4/29 with acute complaints of shortness of breath/difficulty breathing early morning of admission.   Palliative medicine was consulted for assisting with goals of care conversations.  Summary of counseling/coordination of care: After reviewing the patient's chart and assessing the patient at bedside, I discussed symptom burden and goals of care with patient.  Symptoms assessed.  Patient endorses Zyprexa has been "a miracle" and helps her get a good nights rest.  She denies pain or discomfort at this time.  No adjustments to medications needed.  I again outlined patient's wishes and goals of care.  Patient continues to endorse DNR and limited CODE STATUS.  I introduced a MOST form and left 1 at bedside for patient and  granddaughter's review.  I discussed that with discharge looking like she will return to [facility], a MOST form is helpful in making patient's goals and boundaries known at other healthcare facilities.  Patient shares she would like to review it with her granddaughter.  PMT will continue to follow and support patient throughout her hospitalization.  Physical Exam Vitals reviewed.  Constitutional:      General: She is not in acute distress.    Appearance: She is normal weight. She is not ill-appearing.  HENT:     Head: Normocephalic.     Mouth/Throat:     Mouth: Mucous membranes are moist.  Eyes:     Pupils: Pupils are equal, round, and reactive to light.  Pulmonary:     Effort: Pulmonary effort is normal.  Abdominal:     Palpations: Abdomen is soft.  Skin:    General: Skin is warm and dry.  Neurological:     Mental Status: She is alert.  Psychiatric:        Mood and Affect: Mood normal.        Behavior: Behavior normal.        Thought Content: Thought content normal.        Judgment: Judgment normal.             Total Time 25 minutes   Toretto Tingler L. Manon Hilding, FNP-BC Palliative Medicine Team Team Phone # [phone]

## 2022-09-22 NOTE — Progress Notes (Addendum)
[facility] CLINIC CARDIOLOGY CONSULT NOTE       Patient ID: Sydney Gillespie MRN: [id number] DOB/AGE: 07-21-1940 82 y.o.  Admit date: 09/17/2022 Referring Physician Dr. Doree Albee  Primary Physician Dr. Marcello Fennel Primary Cardiologist Dr. Darrold Junker Reason for Consultation CHF  HPI: Sydney Devon "[name]" is an 82yoF with a PMH of multiple myeloma (dx 2012 s/p stem cell transplant, relapse 2021, ongoing tx), HFpEF, moderate MR, recent DVT (currently taking Eliquis) CKD 4, RA, palpitations who presented to [facility] ED from a rehab facility on 09/17/2022 in respiratory distress.  Cardiology is consulted for further assistance.  Interval history: -Alert but remains disoriented to time and situation.  She tells me that the relative that visited earlier this morning is deceased and had her funeral yesterday. -Clinically euvolemic, no chest pain, shortness of breath, peripheral edema -In sinus rhythm on telemetry with controlled rate   Review of systems complete and found to be negative unless listed above     Past Medical History:  Diagnosis Date   Acute respiratory failure with hypoxia (HCC) 05/07/2022   Acute respiratory failure with hypoxia (HCC) 05/07/2022   Allergic rhinitis    Allergy    Anemia    Anemia    Barrett's esophagus    Blood dyscrasia    multiple myloma remission   Cancer (HCC)    Change in bowel habits    Compression fracture 2013   Degenerative disc disease, lumbar    Dysrhythmia    palpitations   Esophageal reflux    Family history of adverse reaction to anesthesia    nausea -mom   GERD (gastroesophageal reflux disease)    Gout    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HCC)    Heart palpitations    Hyperlipidemia    Hypothyroidism    Inflammatory polyarthropathy (HCC)    Lumbago    Lumbar radiculitis    Lumbar stenosis with neurogenic claudication    Multiple myeloma (HCC)    Multiple myeloma (HCC)    Neuropathy    Bilateral Feet   Osteopenia    Osteoporosis     Other dysphagia    Palpitations    Pneumonia 04/2022   R/T COVID   Positive PPD    Renal insufficiency    Rheumatoid arthritis (HCC)    Rheumatoid arthritis (HCC)    Seasonal allergies    Tuberculosis 2011   Vertigo     Past Surgical History:  Procedure Laterality Date   ABDOMINAL HYSTERECTOMY     APPENDECTOMY     BACK SURGERY     BREAST EXCISIONAL BIOPSY Bilateral    neg   CATARACT EXTRACTION Bilateral    CHOLECYSTECTOMY     COLONOSCOPY WITH PROPOFOL N/A 11/12/2016   Procedure: COLONOSCOPY WITH PROPOFOL;  Surgeon: Scot Jun, MD;  Location: [facility] ENDOSCOPY;  Service: Endoscopy;  Laterality: N/A;   ESOPHAGOGASTRODUODENOSCOPY (EGD) WITH PROPOFOL N/A 11/12/2016   Procedure: ESOPHAGOGASTRODUODENOSCOPY (EGD) WITH PROPOFOL;  Surgeon: Scot Jun, MD;  Location: [facility] ENDOSCOPY;  Service: Endoscopy;  Laterality: N/A;   ESOPHAGOGASTRODUODENOSCOPY (EGD) WITH PROPOFOL N/A 07/17/2022   Procedure: ESOPHAGOGASTRODUODENOSCOPY (EGD) WITH PROPOFOL;  Surgeon: Regis Bill, MD;  Location: [facility] ENDOSCOPY;  Service: Endoscopy;  Laterality: N/A;   FOOT SURGERY     KYPHOPLASTY N/A 07/29/2022   Procedure: KYPHOPLASTY THORACIC EIGHT, THORACIC NINE, THORACIC TEN;  Surgeon: Tressie Stalker, MD;  Location: [facility] OR;  Service: Neurosurgery;  Laterality: N/A;  3C   LUMBAR LAMINECTOMY/DECOMPRESSION MICRODISCECTOMY Left 06/18/2016   Procedure: LAMINECTOMY FOR FACET/SYNOVIAL  CYST LUMBAR THREE - LUMBAR FOUR LEFT;  Surgeon: Tressie Stalker, MD;  Location: [facility] OR;  Service: Neurosurgery;  Laterality: Left;  LAMINECTOMY FOR FACET/SYNOVIAL CYST LUMBAR THREE - LUMBAR FOUR LEFT    Medications Prior to Admission  Medication Sig Dispense Refill Last Dose   acetaminophen (TYLENOL) 325 MG tablet Take 2 tablets (650 mg total) by mouth every 6 (six) hours as needed for mild pain (or Fever >/= 101).   unk   allopurinol (ZYLOPRIM) 300 MG tablet Take 300 mg by mouth daily.   09/16/2022 at 0815   ALPRAZolam  (XANAX) 0.25 MG tablet Take 1 tablet (0.25 mg total) by mouth 2 (two) times daily as needed. Once daily for anxiety or sleep (Patient taking differently: Take 0.25 mg by mouth 3 (three) times daily as needed for sleep or anxiety. Once daily for anxiety or sleep) 10 tablet 0 09/16/2022 at 1935   Ascorbic Acid (VITAMIN C) 1000 MG tablet Take 1,000 mg by mouth daily.   09/16/2022 at 0815   CALCIUM MAGNESIUM ZINC PO Take 1 tablet by mouth 2 (two) times daily. 1 am, 2 hs   09/16/2022 at 1934   [EXPIRED] ceFAZolin (ANCEF) IVPB Inject 2 g into the vein every 12 (twelve) hours for 13 days. Indication:  MSSA bacteremia and discitis First Dose: Yes Last Day of Therapy:  09/17/2022 Labs - Once weekly:  CBC/D, CMP, ESR and CRP Please pull PIC at completion of IV antibiotics Fax weekly lab results  promptly to [phone] Method of administration: IV Push Method of administration may be changed at the discretion of home infusion pharmacist based upon assessment of the patient and/or caregiver's ability to self-administer the medication ordered. 26 Units 0 09/16/2022 at 2112   Cholecalciferol (VITAMIN D3) 125 MCG (5000 UT) TABS Take 1 tablet by mouth daily.   09/16/2022 at 0958   docusate sodium (COLACE) 100 MG capsule Take 1 capsule (100 mg total) by mouth 2 (two) times daily. 30 capsule 0 09/16/2022 at 1805   ELIQUIS 5 MG TABS tablet TAKE 1 TABLET(5 MG) BY MOUTH TWICE DAILY (Patient taking differently: Take 5 mg by mouth 2 (two) times daily.) 60 tablet 1 09/16/2022 at 1934   furosemide (LASIX) 20 MG tablet Take 10 mg by mouth daily.   09/15/2022 at 0814   Heparin Sod, Pork, Lock Flush (HEPARIN LOCK FLUSH IV) Inject 5 mLs into the vein 2 (two) times daily.   09/16/2022 at 2112   hydroxychloroquine (PLAQUENIL) 200 MG tablet Take 200 mg by mouth See admin instructions. Patient currently takes 200 mg twice daily Monday through Friday and 200 mg once daily Saturday and Sunday.   09/16/2022 at 1934   Ipratropium-Albuterol (COMBIVENT)  20-100 MCG/ACT AERS respimat Inhale 1 puff into the lungs every 6 (six) hours as needed for wheezing. 4 g 0 unk   iron polysaccharides (NIFEREX) 150 MG capsule Take 1 capsule (150 mg total) by mouth daily. 30 capsule 1 09/16/2022 at 0958   levothyroxine (SYNTHROID) 137 MCG tablet Take 1 tablet (137 mcg total) by mouth daily at 6 (six) AM. 30 tablet 0 09/16/2022 at 0603   midodrine (PROAMATINE) 5 MG tablet Take 1 tablet (5 mg total) by mouth 3 (three) times daily with meals. 90 tablet 0 09/16/2022 at 0958   Multiple Vitamin (MULTIVITAMIN WITH MINERALS) TABS tablet Take 1 tablet by mouth daily. 30 tablet 0 09/16/2022 at 0815   nystatin-triamcinolone (MYCOLOG II) cream Apply 1 Application topically 2 (two) times daily as needed (itching).  unk   OLANZapine (ZYPREXA) 10 MG tablet Take 1 tablet (10 mg total) by mouth at bedtime. 30 tablet 0 09/16/2022 at 2112   polyethylene glycol (MIRALAX / GLYCOLAX) 17 g packet Take 17 g by mouth daily. 30 each 0 09/16/2022 at 0815   Potassium 99 MG TABS Take 1 tablet by mouth daily.   09/16/2022 at 0815   rOPINIRole (REQUIP) 0.25 MG tablet Take 1 tablet (0.25 mg total) by mouth at bedtime. (Patient taking differently: Take 0.5 mg by mouth at bedtime.) 30 tablet 0 09/16/2022 at 1934   senna-docusate (SENOKOT-S) 8.6-50 MG tablet Take 1 tablet by mouth 2 (two) times daily. Hold if having loose or frequent stools. 30 tablet 0 09/16/2022 at 1805   sertraline (ZOLOFT) 50 MG tablet Take 1 tablet (50 mg total) by mouth daily. 30 tablet 3 09/16/2022 at 0815   vitamin B-12 (CYANOCOBALAMIN) 1000 MCG tablet Take 1,000 mcg by mouth daily.   09/16/2022 at 0815   zinc gluconate 50 MG tablet Take 50 mg by mouth daily.   09/16/2022 at 0958   feeding supplement (ENSURE ENLIVE / ENSURE PLUS) LIQD Take 237 mLs by mouth 3 (three) times daily between meals. 21330 mL 0    sodium chloride flush (NS) 0.9 % SOLN Inject 3 mLs into the vein every 12 (twelve) hours. 100 mL 0    Spacer/Aero-Holding Chambers DEVI 1 each  by Does not apply route as needed. 1 each 0     Social History   Socioeconomic History   Marital status: Widowed    Spouse name: Not on file   Number of children: Not on file   Years of education: Not on file   Highest education level: Not on file  Occupational History   Not on file  Tobacco Use   Smoking status: Never   Smokeless tobacco: Never  Vaping Use   Vaping Use: Never used  Substance and Sexual Activity   Alcohol use: No   Drug use: No   Sexual activity: Yes  Other Topics Concern   Not on file  Social History Narrative   Not on file   Social Determinants of Health   Financial Resource Strain: Not on file  Food Insecurity: No Food Insecurity (09/04/2022)   Hunger Vital Sign    Worried About Running Out of Food in the Last Year: Never true    Ran Out of Food in the Last Year: Never true  Transportation Needs: No Transportation Needs (08/05/2022)   PRAPARE - Administrator, Civil Service (Medical): No    Lack of Transportation (Non-Medical): No  Physical Activity: Not on file  Stress: Not on file  Social Connections: Not on file  Intimate Partner Violence: Not At Risk (08/05/2022)   Humiliation, Afraid, Rape, and Kick questionnaire    Fear of Current or Ex-Partner: No    Emotionally Abused: No    Physically Abused: No    Sexually Abused: No    Family History  Problem Relation Age of Onset   Hypertension Mother    Heart attack Mother    Rheum arthritis Mother    Osteoarthritis Mother    Cancer Father    Hypertension Father    Stroke Father    Breast cancer Maternal Grandmother 40   Osteoarthritis Other    Rheum arthritis Other       Intake/Output Summary (Last 24 hours) at 09/22/2022 0845 Last data filed at 09/21/2022 1500 Gross per 24 hour  Intake 240 ml  Output --  Net 240 ml     Vitals:   09/21/22 0749 09/21/22 1619 09/21/22 2355 09/22/22 0500  BP: (!) 157/80 136/63 136/68   Pulse: 79 89 84   Resp: 18 16 18    Temp: 97.6 F  (36.4 C) 98.2 F (36.8 C) 98.2 F (36.8 C)   TempSrc:      SpO2: 100% 97% 98%   Weight:    62.2 kg  Height:        PHYSICAL EXAM General: pleasant elderly Caucasian female, sitting at low angle in bed without family present. HEENT:  Normocephalic and atraumatic. Neck:  No JVD.  Lungs: Normal respiratory effort on room air, trace left-sided crackles  heart: Regular rate, regular rhythm. Normal S1 and S2, 3/6 holosystolic murmur best heard at the apex. Abdomen: Non-distended appearing.  Msk: Normal strength and tone for age. Extremities: Warm and well perfused. No clubbing, cyanosis. No bilateral lower extremity edema.  Neuro: Alert and oriented today Psych:  Answers questions appropriately.   Labs: Basic Metabolic Panel: Recent Labs    09/21/22 0500 09/22/22 0601  NA 142 140  K 3.5 4.1  CL 106 109  CO2 23 24  GLUCOSE 59* 88  BUN 33* 31*  CREATININE 1.39* 1.30*  CALCIUM 8.5* 8.5*  MG 2.3 2.1  PHOS 3.8 2.6    Liver Function Tests: Recent Labs    09/21/22 0500 09/22/22 0601  AST 68* 54*  ALT <5 <5  ALKPHOS 185* 149*  BILITOT 1.1 1.2  PROT 6.0* 5.5*  ALBUMIN 3.0* 2.7*    No results for input(s): "LIPASE", "AMYLASE" in the last 72 hours. CBC: Recent Labs    09/21/22 0500 09/22/22 0601  WBC 9.8 7.0  NEUTROABS 7.6 5.0  HGB 9.2* 8.6*  HCT 29.1* 27.0*  MCV 95.7 95.4  PLT 269 268    Cardiac Enzymes: No results for input(s): "CKTOTAL", "CKMB", "CKMBINDEX", "TROPONINIHS" in the last 72 hours.  BNP: No results for input(s): "BNP" in the last 72 hours.  D-Dimer: No results for input(s): "DDIMER" in the last 72 hours. Hemoglobin A1C: No results for input(s): "HGBA1C" in the last 72 hours. Fasting Lipid Panel: No results for input(s): "CHOL", "HDL", "LDLCALC", "TRIG", "CHOLHDL", "LDLDIRECT" in the last 72 hours.  Thyroid Function Tests: No results for input(s): "TSH", "T4TOTAL", "T3FREE", "THYROIDAB" in the last 72 hours.  Invalid input(s):  "FREET3" Anemia Panel: No results for input(s): "VITAMINB12", "FOLATE", "FERRITIN", "TIBC", "IRON", "RETICCTPCT" in the last 72 hours.   Radiology: ECHOCARDIOGRAM LIMITED  Result Date: 09/17/2022    ECHOCARDIOGRAM LIMITED REPORT   Patient Name:   Sydney Gillespie Date of Exam: 09/17/2022 Medical Rec #:  [id number]          Height:       65.0 in Accession #:    [id number]         Weight:       150.0 lb Date of Birth:  02/21/1941           BSA:          1.750 m Patient Age:    82 years           BP:           147/90 mmHg Patient Gender: F                  HR:           105 bpm. Exam Location:  [facility] Procedure: Limited Echo and Limited Color  Doppler Indications:     CHF  History:         Patient has prior history of Echocardiogram examinations, most                  recent 09/01/2022. CHF, Signs/Symptoms:Bacteremia and Syncope;                  Risk Factors:Dyslipidemia. DVT, CKD.  Sonographer:     Mikki Harbor Referring Phys:  [id number] [facility] MICHELLE Perrion [employer]el Diagnosing Phys: Marcina Millard MD IMPRESSIONS  1. Left ventricular ejection fraction, by estimation, is 35 to 40%. The left ventricle has moderately decreased function. The left ventricle has no regional wall motion abnormalities. The left ventricular internal cavity size was mildly dilated. Indeterminate diastolic filling due to E-A fusion.  2. Right ventricular systolic function is normal. The right ventricular size is normal.  3. Left atrial size was moderately dilated.  4. The mitral valve is normal in structure. Severe mitral valve regurgitation. No evidence of mitral stenosis.  5. The aortic valve is normal in structure. Aortic valve regurgitation is moderate to severe. No aortic stenosis is present.  6. The inferior vena cava is normal in size with greater than 50% respiratory variability, suggesting right atrial pressure of 3 mmHg. FINDINGS  Left Ventricle: Left ventricular ejection fraction, by estimation, is 35 to 40%. The left ventricle has  moderately decreased function. The left ventricle has no regional wall motion abnormalities. The left ventricular internal cavity size was mildly dilated. There is no left ventricular hypertrophy. Indeterminate diastolic filling due to E-A fusion. Right Ventricle: The right ventricular size is normal. No increase in right ventricular wall thickness. Right ventricular systolic function is normal. Left Atrium: Left atrial size was moderately dilated. Right Atrium: Right atrial size was normal in size. Pericardium: There is no evidence of pericardial effusion. Mitral Valve: The mitral valve is normal in structure. Severe mitral valve regurgitation. No evidence of mitral valve stenosis. MV peak gradient, 14.3 mmHg. The mean mitral valve gradient is 5.0 mmHg. Tricuspid Valve: The tricuspid valve is normal in structure. Tricuspid valve regurgitation is not demonstrated. No evidence of tricuspid stenosis. Aortic Valve: The aortic valve is normal in structure. Aortic valve regurgitation is moderate to severe. No aortic stenosis is present. Pulmonic Valve: The pulmonic valve was normal in structure. Pulmonic valve regurgitation is not visualized. No evidence of pulmonic stenosis. Aorta: The aortic root is normal in size and structure. Venous: The inferior vena cava is normal in size with greater than 50% respiratory variability, suggesting right atrial pressure of 3 mmHg. IAS/Shunts: No atrial level shunt detected by color flow Doppler. LEFT VENTRICLE PLAX 2D LVIDd:         5.00 cm LVIDs:         3.70 cm LV PW:         0.80 cm LV IVS:        0.70 cm LVOT diam:     2.00 cm LVOT Area:     3.14 cm  LV Volumes (MOD) LV vol d, MOD A2C: 99.0 ml LV vol d, MOD A4C: 80.2 ml LV vol s, MOD A2C: 52.2 ml LV vol s, MOD A4C: 55.4 ml LV SV MOD A2C:     46.8 ml LV SV MOD A4C:     80.2 ml LV SV MOD BP:      35.7 ml LEFT ATRIUM         Index LA diam:    4.60 cm 2.63 cm/m  AORTA Ao Root diam: 3.40 cm MITRAL VALVE MV Area (PHT): 9.73 cm        SHUNTS MV Peak grad:  14.3 mmHg      Systemic Diam: 2.00 cm MV Mean grad:  5.0 mmHg MV Vmax:       1.89 m/s MV Vmean:      98.4 cm/s MV Decel Time: 78 msec MR Peak grad:    136.0 mmHg MR Mean grad:    84.0 mmHg MR Vmax:         583.00 cm/s MR Vmean:        424.0 cm/s MR PISA:         1.57 cm MR PISA Eff ROA: 25 mm MR PISA Radius:  0.50 cm MV E velocity: 160.00 cm/s Marcina Millard MD Electronically signed by Marcina Millard MD Signature Date/Time: 09/17/2022/5:26:11 PM    Final    CT Angio Chest PE W and/or Wo Contrast  Result Date: 09/17/2022 CLINICAL DATA:  Respiratory distress. EXAM: CT ANGIOGRAPHY CHEST WITH CONTRAST TECHNIQUE: Multidetector CT imaging of the chest was performed using the standard protocol during bolus administration of intravenous contrast. Multiplanar CT image reconstructions and MIPs were obtained to evaluate the vascular anatomy. RADIATION DOSE REDUCTION: This exam was performed according to the departmental dose-optimization program which includes automated exposure control, adjustment of the mA and/or kV according to patient size and/or use of iterative reconstruction technique. CONTRAST:  40mL OMNIPAQUE IOHEXOL 350 MG/ML SOLN COMPARISON:  Chest radiograph 1 day prior.  CTA chest 08/05/2022. FINDINGS: Cardiovascular: There is adequate opacification of the pulmonary arteries to the segmental level. There is no evidence of pulmonary embolism. The main pulmonary artery is enlarged measuring up to 3.3 cm. There is mild calcified plaque in the nonaneurysmal thoracic aorta. The heart size is stable. There is a small pericardial effusion, slightly increased in size since the prior CT. A right upper extremity PICC is in place. The tip is obscured by contrast. Mediastinum/Nodes: The thyroid is unremarkable. The esophagus is grossly unremarkable. There is no mediastinal, hilar, or axillary lymphadenopathy. Lungs/Pleura: The trachea and central airways are patent. There are moderate-sized  bilateral pleural effusions, right larger than left, with adjacent atelectasis in the lower lobes. There is additional probable atelectasis in the lingula. There is no convincing interlobular septal thickening. There is no pneumothorax. Upper Abdomen: Cholecystectomy clips are noted. The imaged portions of the upper abdominal viscera are otherwise unremarkable. Musculoskeletal: There is no acute osseous abnormality or suspicious osseous lesion. The patient is status post vertebral augmentation at T8 through T10 with near-complete collapse of the T9 vertebral body. The appearance is unchanged. Mild compression deformity of the T12 vertebral body is unchanged. Review of the MIP images confirms the above findings. IMPRESSION: 1. No evidence of pulmonary embolism. 2. Moderate-sized bilateral pleural effusions, right larger than left, with adjacent atelectasis in the lower lobes. 3. Small pericardial effusion, slightly increased in size since the prior CT. 4. Enlarged main pulmonary artery suggesting pulmonary hypertension. Electronically Signed   By: Lesia Hausen M.D.   On: 09/17/2022 08:45   DG Chest Portable 1 View  Result Date: 09/17/2022 CLINICAL DATA:  Respiratory distress. EXAM: PORTABLE CHEST 1 VIEW COMPARISON:  09/02/2022 FINDINGS: Diffuse interstitial and hazy airspace opacity. Cardiomegaly. Small pleural effusions are likely. Right PICC with tip at the upper SVC. No pneumothorax. IMPRESSION: 1. Diffuse pulmonary opacity which could be edema or infection. 2. Low volume chest with chronic cardiomegaly. Electronically Signed   By: Tiburcio Pea  M.D.   On: 09/17/2022 06:01   MR BRAIN WO CONTRAST  Result Date: 09/03/2022 CLINICAL DATA:  Initial evaluation for mental status change, unknown cause. EXAM: MRI HEAD WITHOUT CONTRAST TECHNIQUE: Multiplanar, multiecho pulse sequences of the brain and surrounding structures were obtained without intravenous contrast. COMPARISON:  Prior CT from 09/01/2022. FINDINGS:  Brain: Cerebral volume within normal limits for age. Patchy T2/FLAIR hyperintensity involving the periventricular deep white matter both cerebral hemispheres, consistent with chronic small vessel ischemic disease, mild for age. No evidence for acute or subacute infarct. Gray-white matter differentiation maintained. No areas of chronic cortical infarction. No acute or chronic intracranial blood products. No mass lesion, midline shift or mass effect. No hydrocephalus or extra-axial fluid collection. Pituitary gland and suprasellar region within normal limits. Vascular: Major intracranial vascular flow voids are maintained. Mild torcular inversion noted. Skull and upper cervical spine: Craniocervical junction within normal limits. Bone marrow signal intensity normal. No scalp soft tissue abnormality. Sinuses/Orbits: Prior bilateral ocular lens replacement. Paranasal sinuses are largely clear. No significant mastoid effusion. Other: None. IMPRESSION: 1. No acute intracranial abnormality. 2. Mild chronic microvascular ischemic disease for age. Electronically Signed   By: Rise Mu M.D.   On: 09/03/2022 01:52   ECHOCARDIOGRAM LIMITED  Result Date: 09/02/2022    ECHOCARDIOGRAM LIMITED REPORT   Patient Name:   Sydney Gillespie Date of Exam: 09/01/2022 Medical Rec #:  [id number]          Height:       65.0 in Accession #:    [id number]         Weight:       121.3 lb Date of Birth:  04-17-1941           BSA:          1.599 m Patient Age:    82 years           BP:           97/48 mmHg Patient Gender: F                  HR:           64 bpm. Exam Location:  [facility] Procedure: 2D Echo, Cardiac Doppler, Limited Echo and Limited Color Doppler Indications:     R55 Syncope  History:         Patient has prior history of Echocardiogram examinations, most                  recent 08/09/2022. Arrythmias:Palpitations; Risk                  Factors:Dyslipidemia.  Sonographer:     Daphine Deutscher RDCS Referring Phys:   [id number] Verdene Lennert Diagnosing Phys: Alwyn Pea MD IMPRESSIONS  1. Left ventricular ejection fraction, by estimation, is 50 to 55%. The left ventricle has low normal function. The left ventricle demonstrates regional wall motion abnormalities (see scoring diagram/findings for description). Left ventricular diastolic  parameters were normal.  2. Right ventricular systolic function is normal. The right ventricular size is normal.  3. Left atrial size was mildly dilated.  4. A small pericardial effusion is present.  5. The mitral valve is normal in structure. Trivial mitral valve regurgitation.  6. The aortic valve is calcified. Aortic valve regurgitation is mild to moderate. Aortic valve sclerosis/calcification is present, without any evidence of aortic stenosis. FINDINGS  Left Ventricle: Left ventricular ejection fraction, by estimation, is 50 to 55%. The left ventricle has low  normal function. The left ventricle demonstrates regional wall motion abnormalities. The left ventricular internal cavity size was normal in size. There is no left ventricular hypertrophy. Left ventricular diastolic parameters were normal. Right Ventricle: The right ventricular size is normal. No increase in right ventricular wall thickness. Right ventricular systolic function is normal. Left Atrium: Left atrial size was mildly dilated. Right Atrium: Right atrial size was not assessed. Pericardium: A small pericardial effusion is present. Mitral Valve: The mitral valve is normal in structure. Trivial mitral valve regurgitation. Tricuspid Valve: The tricuspid valve is normal in structure. Tricuspid valve regurgitation is mild. Aortic Valve: The aortic valve is calcified. Aortic valve regurgitation is mild to moderate. Aortic regurgitation PHT measures 572 msec. Aortic valve sclerosis/calcification is present, without any evidence of aortic stenosis. Pulmonic Valve: The pulmonic valve was normal in structure. Pulmonic valve  regurgitation is not visualized. Aorta: The ascending aorta was not well visualized. IAS/Shunts: The interatrial septum was not well visualized. Additional Comments: There is no pleural effusion.  LEFT VENTRICLE PLAX 2D LVIDd:         4.10 cm Diastology LVIDs:         3.00 cm LV e' medial:    6.14 cm/s LV PW:         1.00 cm LV E/e' medial:  10.7 LV IVS:        1.00 cm LV e' lateral:   8.22 cm/s                        LV E/e' lateral: 8.0  RIGHT VENTRICLE             IVC RV S prime:     13.40 cm/s  IVC diam: 1.10 cm TAPSE (M-mode): 1.8 cm LEFT ATRIUM         Index LA diam:    4.60 cm 2.88 cm/m  AORTIC VALVE AI PHT:      572 msec  AORTA Ao Root diam: 3.40 cm MITRAL VALVE               TRICUSPID VALVE MV Area (PHT): 2.95 cm    TR [facility] grad:   14.7 mmHg MV Decel Time: 257 msec    TR Vmax:        192.00 cm/s MV E velocity: 65.50 cm/s MV A velocity: 90.70 cm/s MV E/A ratio:  0.72 Dwayne D Callwood MD Electronically signed by Alwyn Pea MD Signature Date/Time: 09/02/2022/7:27:49 AM    Final    DG Chest Port 1 View  Result Date: 09/02/2022 CLINICAL DATA:  PICC placement EXAM: PORTABLE CHEST 1 VIEW COMPARISON:  Yesterday FINDINGS: Right upper extremity PICC with tip at the SVC. Cardiomegaly. Streaky opacity in the bilateral lungs. No visible effusion or pneumothorax. Chronic cardiomegaly. Multilevel cement augmentation in the thoracic spine. Gas distended upper abdominal colon. IMPRESSION: 1. Right upper extremity PICC with tip at the SVC. 2. Cardiomegaly and borderline vascular congestion. Atelectatic type opacity in the bilateral lungs. Electronically Signed   By: Tiburcio Pea M.D.   On: 09/02/2022 04:34   US RENAL  Result Date: 09/01/2022 CLINICAL DATA:  [id number] AKI (acute kidney injury) [id number] EXAM: RENAL / URINARY TRACT ULTRASOUND COMPLETE COMPARISON:  11/04/2017 FINDINGS: Right Kidney: Renal measurements: 9.6 x 4.5 x 4.6 cm = volume: 102 mL. Parenchyma echogenic compared to adjacent liver. No focal  lesion or hydronephrosis. Left Kidney: Renal measurements: 8.2 x 4.2 x 3.7 cm = volume: 67 mL. No hydronephrosis. Exophytic  3.1 x 2.7 x 2.9 cm cyst from lower pole as before. Bladder: Incompletely distended.  Bilateral ureteral jets demonstrated. Other: Bilateral pleural effusions incidentally noted. IMPRESSION: 1. No hydronephrosis. 2. Echogenic renal parenchyma suggesting medical renal disease. 3. 3.1 cm left renal cyst. 4. Bilateral pleural effusions. Electronically Signed   By: Corlis Leak M.D.   On: 09/01/2022 18:35   DG Chest Portable 1 View  Result Date: 09/01/2022 CLINICAL DATA:  Recent pneumonia EXAM: PORTABLE CHEST 1 VIEW COMPARISON:  08/07/2022 FINDINGS: Previously noted bibasilar and right lung opacities are improved, with some residual opacities in the mid to lower right lung and lower left lung, which are somewhat linear and may reflect atelectasis or infection. Unchanged cardiac and mediastinal contours. Aortic atherosclerosis. No pleural effusion or pneumothorax. No acute osseous abnormality. Prior ACDF. IMPRESSION: Previously noted bibasilar and right lung opacities are improved, with some residual opacities in the mid to lower right lung and lower left lung, which may reflect atelectasis and/or infection. Electronically Signed   By: Wiliam Ke M.D.   On: 09/01/2022 15:26   CT HEAD WO CONTRAST ( )  Result Date: 09/01/2022 CLINICAL DATA:  Trauma EXAM: CT HEAD WITHOUT CONTRAST TECHNIQUE: Contiguous axial images were obtained from the base of the skull through the vertex without intravenous contrast. RADIATION DOSE REDUCTION: This exam was performed according to the departmental dose-optimization program which includes automated exposure control, adjustment of the mA and/or kV according to patient size and/or use of iterative reconstruction technique. COMPARISON:  None Available. FINDINGS: Brain: No evidence of acute infarction, hemorrhage, hydrocephalus, extra-axial collection or mass  lesion/mass effect. Sequela of moderate chronic microvascular ischemic change. Vascular: No hyperdense vessel or unexpected calcification. Skull: Normal. Negative for fracture or focal lesion. Sinuses/Orbits: No mastoid or middle ear effusion. Paranasal sinuses are notable for frothy secretions in the bilateral sphenoid sinuses, which can be seen in the setting sinusitis. Bilateral lens replacement. Orbits are otherwise unremarkable. Other: None. IMPRESSION: 1. No acute intracranial abnormality. 2. Frothy secretions in the bilateral sphenoid sinuses can be seen in the setting of acute sinusitis. Electronically Signed   By: Lorenza Cambridge M.D.   On: 09/01/2022 14:36     TELEMETRY reviewed by me (LT) 09/22/2022 : Sinus rhythm rate 80-90s  EKG reviewed by me: Sinus tachycardia rate 115 with T wave flattening  Data reviewed by me (see) 09/22/2022: Hospitalist progress note, nursing notes last 24h vitals tele labs imaging I/O    Active Problems:   Gout   HLD (hyperlipidemia)   Anemia due to stage 3 chronic kidney disease (HCC)   Hypothyroidism   Rheumatoid arthritis (HCC)   CKD (chronic kidney disease), stage IV (HCC)   Elevated troponin   Acute deep vein thrombosis (DVT) of calf muscle vein of right lower extremity (HCC)   MSSA bacteremia   Acute respiratory failure with hypoxia (HCC)   Acute on chronic diastolic CHF (congestive heart failure) (HCC)   Obstructive lung disease (generalized) (HCC)   Hypokalemia   Delirium due to another medical condition   Dementia without behavioral disturbance (HCC)    ASSESSMENT AND PLAN:  Sydney Devon "[name]" is an 82yoF with a PMH of multiple myeloma (dx 2012 s/p stem cell transplant, relapse 2021, ongoing tx), HFpEF, moderate MR, recent DVT (currently taking Eliquis) CKD 4, RA, palpitations who presented to [facility] ED from a rehab facility on 09/17/2022 in respiratory distress.  Cardiology is consulted for further assistance.  # Delirium Per  granddaughter, she has a history of  confusion during her previous hospitalizations.  Suspect hospital delirium, neuroexam is nonfocal.  Recommend frequent reorientation and minimal disturbances in the evening. -Waxing and waning mental status throughout admission  # Acute on chronic HFrEF # Moderate-severe mitral regurgitation BNP markedly elevated at 3200 with pleural effusions bilaterally on chest x-ray and CTA chest.  Suspect some decompensation in part from stopping her diuretic within the past 10 days and some continued reduction in her EF with multiple severe illnesses requiring hospitalization over the past couple months.  Clinically euvolemic on exam today, on room air without peripheral edema. -S/p IV diuresis, Cr plateauing.   -Start torsemide 10 mg daily today -Consolidate metoprolol to 75 mg XL dosing tomorrow.  Do not restart PTA atenolol -Continue losartan 12.5 mg daily, ARNI cost prohibitive -Monitor and replenish electrolytes for goal K >4, mag >2 -Further GDMT as BP and renal function tolerate with MRA, SGLT2i, likely on an outpatient basis. -Will need close clinic follow-up outpatient for further surveillance of her MR  # Sinus tachycardia  In the setting of respiratory distress and hypoxia, also possible rebound tachycardia after stopping atenolol abruptly after her last hospital discharge.  Will start metoprolol as above, and continue Eliquis 5 mg twice daily as treatment of her recent DVT as below.  repeat EKG reviewed by myself and Dr. Darrold Junker demonstrate sinus tachycardia - Continue telemetry monitoring. Patient HR improved to 80-90s this AM.  -Continue Metoprolol as above  -Monitor and replenish electrolytes as above  # Recent MSSA bacteremia PICC line present, antibiotics per primary/infectious disease  # History of DVT Eliquis 5 mg twice daily as above  # Demand ischemia Borderline elevated and flat trending, in the absence of ischemic EKG changes and in the  setting of acute on chronic CHF, this is most consistent with demand/supply mismatch and not ACS   [facility] for discharge today from a cardiac perspective. We will sign off. Pleas call with questions or reengage if needed.  Will arrange for follow up in clinic with Dr. Darrold Junker in 1-2 weeks.    This patient's plan of care was discussed and created with Dr. Darrold Junker and he is in agreement.  Signed: Rebeca Allegra , PA-C 09/22/2022, 8:45 AM [facility] Cardiology

## 2022-09-22 NOTE — Plan of Care (Signed)
  Problem: Physical Regulation: Goal: Complications related to the disease process, condition or treatment will be avoided or minimized Outcome: Progressing   Problem: Clinical Measurements: Goal: Ability to maintain clinical measurements within normal limits will improve Outcome: Progressing

## 2022-09-22 NOTE — Progress Notes (Addendum)
KEITA NAM ZOX:[id number] DOB: Mar 31, 1941 DOA: 09/17/2022 PCP: Barbette Reichmann, MD   Subj: BAYE SCHNOOR is a 82 y.o. WF PMHx recent MSSA bacteremia on cefazolin, multiple myeloma, CKD stage III, obstructive lung disease, Chronic Systolic CHF (LVEF 35 to 40%), HTN, HLD, RA, lumbar DJD, Hx of DVT on Apixaban, Anemia, TB  Presenting with acute respiratory failure with hypoxia, volume overload.  Patient noted to have been recently admitted April 23 through April 29 for acute on chronic kidney disease.  Received IV hydration and discharge.  Per the granddaughter, patient has had worsening lower extremity swelling since discharge.  Has been taking outpatient diuretics still with lower extremity swelling.  Compression hose were not placed at facility per report.  Compression hose were then placed recently with the past 1 to 2 days with patient developing significant shortness of breath.  No fevers or chills.  No nausea or vomiting.  No chest pain.  No abdominal pain.  Developed significant shortness of breath over the past 12 to 24 hours.  No reported NSAID use or high salt intake. Presented to the ER afebrile, heart rate in the 120s, initially requiring BiPAP.  Given 60 mg IV Lasix with patient being transition to 45 L nasal cannula.  White count 10, hemoglobin 8, troponin 26-31, COVID negative, creatinine 0.92.  Potassium 3.0, BNP 3282.  EKG sinus tach.  CTA of the chest negative for PE but does show moderate-sized bilateral pleural effusions right greater than left, small pericardial effusion as well as findings concerning for pulmonary hypertension   Obj: 5/14 afebrile overnight, A/O x 4, but again having hallucinations.  Per sister who is at bedside patient has been having hallucinations all day.  Thinks she is in the kitchen at times, sees knives on the floor.   Objective: VITAL SIGNS: Temp: 97.8 F (36.6 C) (05/14 1225) BP: 148/82 (05/14 1225) Pulse Rate: 84 (05/14  1225)   VENTILATOR SETTINGS: Room air 5/14 SpO2 98%   Procedures/Significant Events: 3/29 MRI T-spine W/W. Wo contrast Status post augmentation at T8, T9 and T10 without progression of height loss. 2. 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 Small amount of ventral epidural enhancing material may be dilated venous plexus or small epidural abscess/phlegmon. 3. Moderate T9 spinal canal stenosis. ----------------------------------------------------------------------------------------------------------------------------------------------0 5/9 echocardiogram limited  Left Ventricle: LVEF= 35 to 40%. The left ventricle has moderately decreased function. -Indeterminate diastolic  Left Atrium: moderately dilated.  Mitral Valve:  Severe mitral valve regurgitation.  Aortic Valve: regurgitation is moderate to severe.  5/9 CTA PE protocol  Moderate-sized bilateral pleural effusions, right larger than left, with adjacent atelectasis in the lower lobes. 3. Small pericardial effusion, slightly increased in size since the prior CT. 4. Enlarged main pulmonary artery suggesting pulmonary hypertension.   Consultants:  Cardiology Dr.Jayashree Ravishankar ID, Palliative care   Cultures 3/27 blood positive MSSA  ---------------------------------------------------------------------------------------------------------------------------------------------------- 5/9 blood RIGHT hand NGTD 5/9 blood RIGHT arm NGTD    Antimicrobials: Anti-infectives (From admission, onward)    Start     Ordered Stop   09/19/22 1000  hydroxychloroquine (PLAQUENIL) tablet 200 mg        09/17/22 1006     09/17/22 1030  hydroxychloroquine (PLAQUENIL) tablet 200 mg       Note to Pharmacy: Patient currently takes 200 mg twice daily Monday through Friday and 200 mg once daily Saturday and Sunday.   09/17/22 0949     09/17/22 1015  ceFAZolin (ANCEF) IVPB 2g/100 mL premix        05 /09/24  1009 09/17/22  2200   09/17/22 1000  ceFAZolin (ANCEF) IVPB  Status:  Discontinued       Note to Pharmacy: Indication:  MSSA bacteremia and discitis First Dose: Yes Last Day of Therapy:  09/17/2022 Labs - Once weekly:  CBC/D, CMP, ESR and CRP Please pull PIC at completion of IV antibiotics Fax weekly lab results  promptly to [id number] Method of administration: IV Push Method of administration m   09/17/22 0949 09/17/22 1009        Intake/Output Summary (Last 24 hours) at 09/22/2022 1540 Last data filed at 09/22/2022 1421 Gross per 24 hour  Intake 120 ml  Output --  Net 120 ml    Physical Exam:  General: A/O x 4 (having hallucinations again today) No acute respiratory distress Eyes: negative scleral hemorrhage, negative anisocoria, negative icterus ENT: Negative Runny nose, negative gingival bleeding, Neck:  Negative scars, masses, torticollis, lymphadenopathy, JVD Lungs: Clear to auscultation bilaterally without wheezes or crackles Cardiovascular: Regular rate and rhythm without murmur gallop or rub normal S1 and S2 Abdomen: negative abdominal pain, nondistended, positive soft, bowel sounds, no rebound, no ascites, no appreciable mass Extremities: No significant cyanosis, clubbing, or edema bilateral lower extremities Skin: Negative rashes, lesions, ulcers Psychiatric:  Negative depression, negative anxiety, negative fatigue, negative mania, positive hallucinations believes at times she can cook a meal (in kitchen).  Sees knives on floor Central nervous system:  Cranial nerves II through XII intact, tongue/uvula midline, all extremities muscle strength 5/5, sensation intact throughout, negative dysarthria, negative expressive aphasia, negative receptive aphasia.      DVT prophylaxis: Apixaban Code Status:  Family Communication: 5/14 sister and her husband at bedside (and spoke with granddaughter [name] on the phone) for discussion of plan of care all questions answered      Dispo:  The patient is from: SNF              Anticipated d/c is to: SNF              Anticipated d/c date is: > 3 days              Patient currently is not medically stable to d/c.      Assessment & Plan: Covid vaccination;   Active Problems:   Acute respiratory failure with hypoxia (HCC)   Acute on chronic diastolic CHF (congestive heart failure) (HCC)   CKD (chronic kidney disease), stage IV (HCC)   Elevated troponin   Gout   MSSA bacteremia   HLD (hyperlipidemia)   Anemia due to stage 3 chronic kidney disease (HCC)   Hypothyroidism   Rheumatoid arthritis (HCC)   Acute deep vein thrombosis (DVT) of calf muscle vein of right lower extremity (HCC)   Obstructive lung disease (generalized) (HCC)   Hypokalemia   Delirium due to another medical condition   Dementia without behavioral disturbance (HCC)   Acute Systolic CHF (current LVEF 35 to 40%) -April 2024 echocardiogram LVEF 50 to 55% -Acute respiratory failure with volume overload with noted BNP of 3300 -Reach out to cardiology for consultation -Strict in and out +500 ml - Daily weight Filed Weights   09/19/22 0725 09/21/22 0614 09/22/22 0500  Weight: 61.8 kg 60.4 kg 62.2 kg  -5/10 Lasix IV 60 mg BID---> decrease to 40mg  daily per cardiology recommendation  -5/10 Metoprolol 25 mg BID -5/11 Lasix dose held today per cardiology recommendation. -5/13 Losartan 12.5 mg daily per cardiology -5/13 Toprol 75 mg daily (start date 5/14), DC Metoprolol per  cardiology -5/14 cardiology has signed off, discharged on current medications. - Patient has follow-up appointment with cardiology: [name] her granddaughter has date and time.  Elevated troponin Troponins 20s to 30s in the setting of volume overload Suspect secondary heart strain EKG grossly stable Follow  Hypoalbuminemia -5/10 albumin 50 g x 1   Acute respiratory failure with hypoxia (HCC) -Decompensated respiratory status initially requiring BiPAP-now on 2 L in the  setting of flash pulmonary edema and volume overload -CTA chest negative for PE, + moderate bilateral pleural effusions and small pericardial effusion, ? Pulm HTN  -Lasix IV 60 mg BID -5/10 per cardiology    -Decrease Lasix IV 60 mg daily  -Optimize HF medication  -MR is worse than previous at mod-severe.  -Resolved  Obstructive lung disease (generalized) (HCC) -Stable, SOB secondary to fluid overload. - See acute respiratory failure with hypoxia -Resolved   CKD (chronic kidney disease), stage IV (Baseline creatinine 1-1.7) -If above is truly her baseline creatinine now stage IV CKD.   -Will continue to diurese secondary to above and determine what baseline creatinine actually is. Lab Results  Component Value Date   CREATININE 1.30 (H) 09/22/2022   CREATININE 1.39 (H) 09/21/2022   CREATININE 1.38 (H) 09/20/2022   CREATININE 1.21 (H) 09/19/2022   CREATININE 1.09 (H) 09/18/2022  -5/11 mildly elevated see CHF -5/12 at baseline but trending up, continue to monitor off Lasix -At baseline will need to follow-up with PCP in 1 to 2 weeks to monitor renal function closely.   Hx MSSA bacteremia -Diagnosed 3/27 has been on antibiotics since. -On IV cefazolin with today May 9 being the last day of treatment.  However will continue until obtain ID consult in a.m. given that repeat labs are pending to see if she is actually clear infection. 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 - See MSSA bacteremia  Acute deep vein thrombosis (DVT) of calf muscle vein of right lower extremity (HCC) -Continue Eliquis   Rheumatoid arthritis (HCC) -Continue Plaquenil    Hypothyroidism -Synthroid 137 mcg daily   Anemia due to CKD stage III  (baseline HgB~8) -Transfuse for hemoglobin<7  -5/11 transfuse 1 unit PRBC Lab Results  Component Value Date   HGB 8.6 (L) 09/22/2022   HGB 9.2 (L) 09/21/2022   HGB 8.5 (L) 09/20/2022   HGB 7.2 (L) 09/19/2022   HGB 7.0 (L) 09/19/2022   HLD (hyperlipidemia) - 5/11 LDL= within CHF guidelines - 5/11 Lipitor 40 mg daily  Dementia without behavioral disturbance/hospital delirium -5/12 patient having waxing and waning mentation.  At times hallucinating. - 5/12 decrease Xanax 0.25 mg to BID PRN  Insomnia - 5/12 increase Zyprexa 15 mg qhs  Gout -Allopurinol 300 mg daily   Hypokalemia -Potassium goal> 4  -5/13 K-Dur 20 mEq  Hypocalcemia - Calcium goal> 8.9    Goals of care - 5/11 palliative care consult: Worsening systolic CHF, as well as additional multiple medical problems consult for palliative care at home vs hospice.  Granddaughter Youth worker) requests that you set up appointment via phone or in person consult with her and grandfather being present.    Mobility Assessment (last 72 hours)     Mobility Assessment     Row Name 09/22/22 0746 09/21/22 2000 09/21/22 0837 09/20/22 2000 09/20/22 0946   Does patient have an order  for bedrest or is patient medically unstable No - Continue assessment No - Continue assessment No - Continue assessment No - Continue assessment No - Continue assessment   What is the highest level of mobility based on the progressive mobility assessment? Level 4 (Walks with assist in room) - Balance while marching in place and cannot step forward and back - Complete Level 5 (Walks with assist in room/hall) - Balance while stepping forward/back and can walk in room with assist - Complete Level 5 (Walks with assist in room/hall) - Balance while stepping forward/back and can walk in room with assist - Complete Level 1 (Bedfast) - Unable to balance while sitting on edge of bed Level 1 (Bedfast) - Unable to balance while sitting on edge of bed   Is the above level  different from baseline mobility prior to current illness? Yes - Recommend PT order -- -- -- --    Row Name 09/19/22 2200           Does patient have an order for bedrest or is patient medically unstable No - Continue assessment       What is the highest level of mobility based on the progressive mobility assessment? Level 1 (Bedfast) - Unable to balance while sitting on edge of bed       Is the above level different from baseline mobility prior to current illness? Yes - Recommend PT order                     Time: 50 minutes         Care during the described time interval was provided by me .  I have reviewed this patient's available data, including medical history, events of note, physical examination, and all test results as part of my evaluation.

## 2022-09-23 DIAGNOSIS — N184 Chronic kidney disease, stage 4 (severe): Secondary | ICD-10-CM

## 2022-09-23 DIAGNOSIS — R7989 Other specified abnormal findings of blood chemistry: Secondary | ICD-10-CM | POA: Diagnosis not present

## 2022-09-23 DIAGNOSIS — J9601 Acute respiratory failure with hypoxia: Secondary | ICD-10-CM | POA: Diagnosis not present

## 2022-09-23 DIAGNOSIS — M109 Gout, unspecified: Secondary | ICD-10-CM

## 2022-09-23 DIAGNOSIS — I5033 Acute on chronic diastolic (congestive) heart failure: Secondary | ICD-10-CM | POA: Diagnosis not present

## 2022-09-23 LAB — COMPREHENSIVE METABOLIC PANEL
ALT: 5 U/L (ref 0–44)
AST: 46 U/L — ABNORMAL HIGH (ref 15–41)
Albumin: 2.7 g/dL — ABNORMAL LOW (ref 3.5–5.0)
Alkaline Phosphatase: 130 U/L — ABNORMAL HIGH (ref 38–126)
Anion gap: 8 (ref 5–15)
BUN: 27 mg/dL — ABNORMAL HIGH (ref 8–23)
CO2: 27 mmol/L (ref 22–32)
Calcium: 8.8 mg/dL — ABNORMAL LOW (ref 8.9–10.3)
Chloride: 109 mmol/L (ref 98–111)
Creatinine, Ser: 1.23 mg/dL — ABNORMAL HIGH (ref 0.44–1.00)
GFR, Estimated: 44 mL/min — ABNORMAL LOW (ref >60)
Glucose, Bld: 85 mg/dL (ref 70–99)
Potassium: 3.4 mmol/L — ABNORMAL LOW (ref 3.5–5.1)
Sodium: 144 mmol/L (ref 135–145)
Total Bilirubin: 0.5 mg/dL (ref 0.3–1.2)
Total Protein: 5.3 g/dL — ABNORMAL LOW (ref 6.5–8.1)

## 2022-09-23 LAB — CBC WITH DIFFERENTIAL/PLATELET
Abs Immature Granulocytes: 0.06 10*3/uL (ref 0.00–0.07)
Basophils Absolute: 0.1 10*3/uL (ref 0.0–0.1)
Basophils Relative: 1 %
Eosinophils Absolute: 0 10*3/uL (ref 0.0–0.5)
Eosinophils Relative: 0 %
HCT: 26.1 % — ABNORMAL LOW (ref 36.0–46.0)
Hemoglobin: 8.1 g/dL — ABNORMAL LOW (ref 12.0–15.0)
Immature Granulocytes: 1 %
Lymphocytes Relative: 19 %
Lymphs Abs: 1.2 10*3/uL (ref 0.7–4.0)
MCH: 29.9 pg (ref 26.0–34.0)
MCHC: 31 g/dL (ref 30.0–36.0)
MCV: 96.3 fL (ref 80.0–100.0)
Monocytes Absolute: 0.7 10*3/uL (ref 0.1–1.0)
Monocytes Relative: 11 %
Neutro Abs: 4.4 10*3/uL (ref 1.7–7.7)
Neutrophils Relative %: 68 %
Platelets: 242 10*3/uL (ref 150–400)
RBC: 2.71 MIL/uL — ABNORMAL LOW (ref 3.87–5.11)
RDW: 18.8 % — ABNORMAL HIGH (ref 11.5–15.5)
WBC: 6.4 10*3/uL (ref 4.0–10.5)
nRBC: 0 % (ref 0.0–0.2)

## 2022-09-23 LAB — PHOSPHORUS: Phosphorus: 3.1 mg/dL (ref 2.5–4.6)

## 2022-09-23 LAB — BPAM RBC

## 2022-09-23 LAB — MAGNESIUM: Magnesium: 1.8 mg/dL (ref 1.7–2.4)

## 2022-09-23 MED ORDER — LOSARTAN POTASSIUM 25 MG PO TABS: 12.5000 mg | ORAL_TABLET | Freq: Every day | ORAL | 0 refills | Status: AC

## 2022-09-23 MED ORDER — CEFADROXIL 500 MG PO CAPS: 500.0000 mg | ORAL_CAPSULE | Freq: Two times a day (BID) | ORAL | 0 refills | Status: AC

## 2022-09-23 MED ORDER — ATORVASTATIN CALCIUM 40 MG PO TABS: 40.0000 mg | ORAL_TABLET | Freq: Every day | ORAL | 0 refills | Status: AC

## 2022-09-23 MED ORDER — ALPRAZOLAM 0.25 MG PO TABS: 0.2500 mg | ORAL_TABLET | Freq: Two times a day (BID) | ORAL | 0 refills | Status: AC | PRN

## 2022-09-23 MED ORDER — METOPROLOL SUCCINATE ER 25 MG PO TB24: 75.0000 mg | ORAL_TABLET | Freq: Every day | ORAL | 0 refills | Status: AC

## 2022-09-23 MED ORDER — TORSEMIDE 10 MG PO TABS: 10.0000 mg | ORAL_TABLET | Freq: Every day | ORAL | 0 refills | Status: AC

## 2022-09-23 MED ORDER — PANTOPRAZOLE SODIUM 40 MG PO TBEC: 40.0000 mg | DELAYED_RELEASE_TABLET | Freq: Every day | ORAL | 0 refills | Status: AC

## 2022-09-23 NOTE — TOC Progression Note (Addendum)
Transition of Care [facility]) - Progression Note    Patient Details  Name: Sydney Gillespie MRN: [id number] Date of Birth: [birth year]  [facility] CM/SW Contact  Marlowe Sax, RN Phone Number: 09/23/2022, 10:14 AM  Clinical Narrative:   Spoke to [place] daughter [name], I provided the room number 602 at [facility], EMS will be the mode of transport, she stated with the delirium she has had she does not feel that she would be a good personal vehicle transport  EMS called shr is number 4 on the list       Expected Discharge Plan and Services         Expected Discharge Date: 09/23/22                                     Social Determinants of Health (SDOH) Interventions SDOH Screenings   Food Insecurity: No Food Insecurity (09/04/2022)  Housing: Low Risk  (09/04/2022)  Transportation Needs: No Transportation Needs (08/05/2022)  Utilities: Not At Risk (08/05/2022)  Tobacco Use: Low Risk  (09/17/2022)    Readmission Risk Interventions    09/03/2022    3:44 PM 08/06/2022    3:47 PM 05/10/2022    2:56 PM  Readmission Risk Prevention Plan  Transportation Screening Complete Complete Complete  Medication Review (RN Care Manager) Complete Complete Complete  PCP or Specialist appointment within 3-5 days of discharge  Complete Complete  HRI or Home Care Consult Complete  Complete  SW Recovery Care/Counseling Consult Complete Complete Complete  Palliative Care Screening Complete Not Applicable Not Applicable  Skilled Nursing Facility Complete Not Applicable Not Applicable

## 2022-09-23 NOTE — Discharge Summary (Signed)
Physician Discharge Summary   Patient: Sydney Gillespie MRN: [id number] DOB: [birth year]  Admit date:     09/17/2022  Discharge date: 09/23/22  Discharge Physician: Alberteen Sam   PCP: Barbette Reichmann, MD     Recommendations at discharge:  Follow up with Infectious Disease for MSSA bacteremia and discitis Follow up with Cardiology for heart failure Follow up with PCP for delirium and hypothyroidism Dr. 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 F with MM, CKD IV, COPD< sCHF E 35-40%, RA, hx DVT on Apixaban, and recent MSSA bacteremia and discitis who presented with worsening leg swelling and shortness of breath, found to have CHF flare.    Assessment and Plan: Acute respiratory failure with hypoxia (HCC) Acute on chronic systolic and diastolic CHF (congestive heart failure) (HCC) Admitted requiring >6L O2, due to CHF.  Treated with diuretics, Cardiology consulted.  Diuresed to room air, and transitioned to new torsemide.  Cardiology started losartan and metoprolol.   MSSA bacteremia Admitted at the end of her expected course of IV cefazolin.  While in the hospital, she was seen by ID who recommended transitioning to oral  cefadroxil for 2 last weeks of treatment.   Delirium Hospitalization complicated by delirium.  TSH elevated 3 weeks ago, dose adjusted.  Suspect this is demential related hospital delirium in the setting of chronic Xanax use.           The [facility] Controlled Substances Registry was reviewed for this patient prior to discharge.   Consultants: Cardiology, Infectious disease Procedures performed: CTA chest, Echocardiogram Disposition: Long term care facility Diet recommendation:  Discharge Diet Orders (From admission, onward)     Start     Ordered   09/23/22 0000  Diet - low sodium heart healthy        09/23/22 0939             DISCHARGE MEDICATION: Allergies as of 09/23/2022       Reactions   Isoniazid Other (See Comments)   Blisters         Medication List     STOP taking these medications    ceFAZolin  IVPB Commonly known as: ANCEF   furosemide 20 MG tablet Commonly known as: LASIX   sodium chloride flush 0.9 % Soln Commonly known as: NS       TAKE these medications    acetaminophen 325 MG tablet Commonly known as: TYLENOL Take 2 tablets (650 mg total) by mouth every 6 (six) hours as needed for mild pain (or Fever >/= 101).   allopurinol 300 MG tablet Commonly known as: ZYLOPRIM Take 300 mg by mouth daily.   ALPRAZolam 0.25 MG  tablet Commonly known as: XANAX Take 1 tablet (0.25 mg total) by mouth 2 (two) times daily as needed. Once daily for anxiety or sleep What changed:  when to take this reasons to take this   atorvastatin 40 MG tablet Commonly known as: LIPITOR Take 1 tablet (40 mg total) by mouth daily. Start taking on: Sep 24, 2022   CALCIUM MAGNESIUM ZINC PO Take 1 tablet by mouth 2 (two) times daily. 1 am, 2 hs   cefadroxil 500 MG capsule Commonly known as: DURICEF Take 1 capsule (500 mg total) by mouth 2 (two) times daily.   cyanocobalamin 1000 MCG tablet Commonly known as: VITAMIN B12 Take 1,000 mcg by mouth  daily.   docusate sodium 100 MG capsule Commonly known as: COLACE Take 1 capsule (100 mg total) by mouth 2 (two) times daily.   Eliquis 5 MG Tabs tablet Generic drug: apixaban TAKE 1 TABLET(5 MG) BY MOUTH TWICE DAILY What changed: See the new instructions.   feeding supplement Liqd Take 237 mLs by mouth 3 (three) times daily between meals.   HEPARIN LOCK FLUSH IV Inject 5 mLs into the vein 2 (two) times daily.   hydroxychloroquine 200 MG tablet Commonly known as: PLAQUENIL Take 200 mg by mouth See admin instructions. Patient currently takes 200 mg twice daily Monday through Friday and 200 mg once daily Saturday and Sunday.   Ipratropium-Albuterol 20-100 MCG/ACT Aers respimat Commonly known as: COMBIVENT Inhale 1 puff into the lungs every 6 (six) hours as needed for wheezing.   iron polysaccharides 150 MG capsule Commonly known as: NIFEREX Take 1 capsule (150 mg total) by mouth daily.   levothyroxine 137 MCG tablet Commonly known as: SYNTHROID Take 1 tablet (137 mcg total) by mouth daily at 6 (six) AM.   losartan 25 MG tablet Commonly known as: COZAAR Take 0.5 tablets (12.5 mg total) by mouth daily. Start taking on: Sep 24, 2022   metoprolol succinate 25 MG 24 hr tablet Commonly known as: TOPROL-XL Take 3 tablets (75 mg total) by mouth daily. Start taking on: Sep 24, 2022   midodrine 5 MG tablet Commonly known as: PROAMATINE Take 1 tablet (5 mg total) by mouth 3 (three) times daily with meals.   multivitamin with minerals Tabs tablet Take 1 tablet by mouth daily.   nystatin-triamcinolone cream Commonly known as: MYCOLOG II Apply 1 Application topically 2 (two) times daily as needed (itching).   OLANZapine 10 MG tablet Commonly known as: ZYPREXA Take 1 tablet (10 mg total) by mouth at bedtime.   pantoprazole 40 MG tablet Commonly known as: PROTONIX Take 1 tablet (40 mg total) by mouth daily. Start taking on: Sep 24, 2022   polyethylene glycol 17 g  packet Commonly known as: MIRALAX / GLYCOLAX Take 17 g by mouth daily.   Potassium 99 MG Tabs Take 1 tablet by mouth daily.   rOPINIRole 0.25 MG tablet Commonly known as: REQUIP Take 1 tablet (0.25 mg total) by mouth at bedtime. What changed: how much to take   senna-docusate 8.6-50 MG tablet Commonly known as: Senokot-S Take 1 tablet by mouth 2 (two) times daily. Hold if having loose or frequent stools.   sertraline 50 MG tablet Commonly known as: ZOLOFT Take 1 tablet (50 mg total) by mouth daily.   Spacer/Aero-Holding Rudean Curt 1 each by Does not apply route as needed.   torsemide 10 MG tablet Commonly known as: DEMADEX Take 1 tablet (10 mg total) by mouth daily. Start taking on: Sep 24, 2022  vitamin C 1000 MG tablet Take 1,000 mg by mouth daily.   Vitamin D3 125 MCG (5000 UT) Tabs Take 1 tablet by mouth daily.   zinc gluconate 50 MG tablet Take 50 mg by mouth daily.        Follow-up Information     Paraschos, Alexander, MD. Go in 1 week(s).   Specialty: Cardiology Contact information: [address] [facility] [place] [address] [phone]                 Discharge Instructions     Diet - low sodium heart healthy   Complete by: As directed    Discharge instructions   Complete by: As directed    STOP furosemide REPLACE it with torsemide, a different more potent diuretic Continue taking potassium supplements with it  START the new heart protection medicines losartan and metoprolol CONTINUE midodrine Go see your heart doctor in 2-3 weeks  STOP the cefazolin antibiotic Replace it with cefadroxil for two more weeks, to finish on 6/26   Increase activity slowly   Complete by: As directed        Discharge Exam: Filed Weights   09/21/22 0614 09/22/22 0500 09/23/22 0500  Weight: 60.4 kg 62.2 kg 59.5 kg    General: Pt is alert, awake, not in acute distress, lips dry, lying in bed Cardiovascular: RRR,  nl S1-S2, no murmurs appreciated.   No LE edema.   Respiratory: Normal respiratory rate and rhythm.  CTAB without rales or wheezes. Abdominal: Abdomen soft and non-tender.  No distension or HSM.   Neuro/Psych: Strength symmetric in upper and lower extremities.  Judgment and insight appear impaired but consistent with dementia.   Condition at discharge: stable  The results of significant diagnostics from this hospitalization (including imaging, microbiology, ancillary and laboratory) are listed below for reference.   Imaging Studies: ECHOCARDIOGRAM LIMITED  Result Date: 09/17/2022    ECHOCARDIOGRAM LIMITED REPORT   Patient Name:   Sydney Gillespie Date of Exam: 09/17/2022 Medical Rec #:  [id number]          Height:       65.0 in Accession #:    [id number]         Weight:       150.0 lb Date of Birth:  June 01, 1940           BSA:          1.750 m Patient Age:    82 years           BP:           147/90 mmHg Patient Gender: F                  HR:           105 bpm. Exam Location:  [facility] Procedure: Limited Echo and Limited Color Doppler Indications:     CHF  History:         Patient has prior history of Echocardiogram examinations, most                  recent 09/01/2022. CHF, Signs/Symptoms:Bacteremia and Syncope;                  Risk Factors:Dyslipidemia. DVT, CKD.  Sonographer:     Mikki Harbor Referring Phys:  [id number] [facility] MICHELLE TANG Diagnosing Phys: Marcina Millard MD IMPRESSIONS  1. Left ventricular ejection fraction, by estimation, is 35 to 40%. The left ventricle has moderately decreased function.  The left ventricle has no regional wall motion abnormalities. The left ventricular internal cavity size was mildly dilated. Indeterminate diastolic filling due to E-A fusion.  2. Right ventricular systolic function is normal. The right ventricular size is normal.  3. Left atrial size was moderately dilated.  4. The mitral valve is normal in structure. Severe mitral valve regurgitation. No evidence of  mitral stenosis.  5. The aortic valve is normal in structure. Aortic valve regurgitation is moderate to severe. No aortic stenosis is present.  6. The inferior vena cava is normal in size with greater than 50% respiratory variability, suggesting right atrial pressure of 3 mmHg. FINDINGS  Left Ventricle: Left ventricular ejection fraction, by estimation, is 35 to 40%. The left ventricle has moderately decreased function. The left ventricle has no regional wall motion abnormalities. The left ventricular internal cavity size was mildly dilated. There is no left ventricular hypertrophy. Indeterminate diastolic filling due to E-A fusion. Right Ventricle: The right ventricular size is normal. No increase in right ventricular wall thickness. Right ventricular systolic function is normal. Left Atrium: Left atrial size was moderately dilated. Right Atrium: Right atrial size was normal in size. Pericardium: There is no evidence of pericardial effusion. Mitral Valve: The mitral valve is normal in structure. Severe mitral valve regurgitation. No evidence of mitral valve stenosis. MV peak gradient, 14.3 mmHg. The mean mitral valve gradient is 5.0 mmHg. Tricuspid Valve: The tricuspid valve is normal in structure. Tricuspid valve regurgitation is not demonstrated. No evidence of tricuspid stenosis. Aortic Valve: The aortic valve is normal in structure. Aortic valve regurgitation is moderate to severe. No aortic stenosis is present. Pulmonic Valve: The pulmonic valve was normal in structure. Pulmonic valve regurgitation is not visualized. No evidence of pulmonic stenosis. Aorta: The aortic root is normal in size and structure. Venous: The inferior vena cava is normal in size with greater than 50% respiratory variability, suggesting right atrial pressure of 3 mmHg. IAS/Shunts: No atrial level shunt detected by color flow Doppler. LEFT VENTRICLE PLAX 2D LVIDd:         5.00 cm LVIDs:         3.70 cm LV PW:         0.80 cm LV IVS:         0.70 cm LVOT diam:     2.00 cm LVOT Area:     3.14 cm  LV Volumes (MOD) LV vol d, MOD A2C: 99.0 ml LV vol d, MOD A4C: 80.2 ml LV vol s, MOD A2C: 52.2 ml LV vol s, MOD A4C: 55.4 ml LV SV MOD A2C:     46.8 ml LV SV MOD A4C:     80.2 ml LV SV MOD BP:      35.7 ml LEFT ATRIUM         Index LA diam:    4.60 cm 2.63 cm/m   AORTA Ao Root diam: 3.40 cm MITRAL VALVE MV Area (PHT): 9.73 cm       SHUNTS MV Peak grad:  14.3 mmHg      Systemic Diam: 2.00 cm MV Mean grad:  5.0 mmHg MV Vmax:       1.89 m/s MV Vmean:      98.4 cm/s MV Decel Time: 78 msec MR Peak grad:    136.0 mmHg MR Mean grad:    84.0 mmHg MR Vmax:         583.00 cm/s MR Vmean:        424.0 cm/s MR PISA:  1.57 cm MR PISA Eff ROA: 25 mm MR PISA Radius:  0.50 cm MV E velocity: 160.00 cm/s Marcina Millard MD Electronically signed by Marcina Millard MD Signature Date/Time: 09/17/2022/5:26:11 PM    Final    CT Angio Chest PE W and/or Wo Contrast  Result Date: 09/17/2022 CLINICAL DATA:  Respiratory distress. EXAM: CT ANGIOGRAPHY CHEST WITH CONTRAST TECHNIQUE: Multidetector CT imaging of the chest was performed using the standard protocol during bolus administration of intravenous contrast. Multiplanar CT image reconstructions and MIPs were obtained to evaluate the vascular anatomy. RADIATION DOSE REDUCTION: This exam was performed according to the departmental dose-optimization program which includes automated exposure control, adjustment of the mA and/or kV according to patient size and/or use of iterative reconstruction technique. CONTRAST:  40mL OMNIPAQUE IOHEXOL 350 MG/ML SOLN COMPARISON:  Chest radiograph 1 day prior.  CTA chest 08/05/2022. FINDINGS: Cardiovascular: There is adequate opacification of the pulmonary arteries to the segmental level. There is no evidence of pulmonary embolism. The main pulmonary artery is enlarged measuring up to 3.3 cm. There is mild calcified plaque in the nonaneurysmal thoracic aorta. The heart size is  stable. There is a small pericardial effusion, slightly increased in size since the prior CT. A right upper extremity PICC is in place. The tip is obscured by contrast. Mediastinum/Nodes: The thyroid is unremarkable. The esophagus is grossly unremarkable. There is no mediastinal, hilar, or axillary lymphadenopathy. Lungs/Pleura: The trachea and central airways are patent. There are moderate-sized bilateral pleural effusions, right larger than left, with adjacent atelectasis in the lower lobes. There is additional probable atelectasis in the lingula. There is no convincing interlobular septal thickening. There is no pneumothorax. Upper Abdomen: Cholecystectomy clips are noted. The imaged portions of the upper abdominal viscera are otherwise unremarkable. Musculoskeletal: There is no acute osseous abnormality or suspicious osseous lesion. The patient is status post vertebral augmentation at T8 through T10 with near-complete collapse of the T9 vertebral body. The appearance is unchanged. Mild compression deformity of the T12 vertebral body is unchanged. Review of the MIP images confirms the above findings. IMPRESSION: 1. No evidence of pulmonary embolism. 2. Moderate-sized bilateral pleural effusions, right larger than left, with adjacent atelectasis in the lower lobes. 3. Small pericardial effusion, slightly increased in size since the prior CT. 4. Enlarged main pulmonary artery suggesting pulmonary hypertension. Electronically Signed   By: Lesia Hausen M.D.   On: 09/17/2022 08:45   DG Chest Portable 1 View  Result Date: 09/17/2022 CLINICAL DATA:  Respiratory distress. EXAM: PORTABLE CHEST 1 VIEW COMPARISON:  09/02/2022 FINDINGS: Diffuse interstitial and hazy airspace opacity. Cardiomegaly. Small pleural effusions are likely. Right PICC with tip at the upper SVC. No pneumothorax. IMPRESSION: 1. Diffuse pulmonary opacity which could be edema or infection. 2. Low volume chest with chronic cardiomegaly. Electronically  Signed   By: Tiburcio Pea M.D.   On: 09/17/2022 06:01   MR BRAIN WO CONTRAST  Result Date: 09/03/2022 CLINICAL DATA:  Initial evaluation for mental status change, unknown cause. EXAM: MRI HEAD WITHOUT CONTRAST TECHNIQUE: Multiplanar, multiecho pulse sequences of the brain and surrounding structures were obtained without intravenous contrast. COMPARISON:  Prior CT from 09/01/2022. FINDINGS: Brain: Cerebral volume within normal limits for age. Patchy T2/FLAIR hyperintensity involving the periventricular deep white matter both cerebral hemispheres, consistent with chronic small vessel ischemic disease, mild for age. No evidence for acute or subacute infarct. Gray-white matter differentiation maintained. No areas of chronic cortical infarction. No acute or chronic intracranial blood products. No mass lesion, midline shift  or mass effect. No hydrocephalus or extra-axial fluid collection. Pituitary gland and suprasellar region within normal limits. Vascular: Major intracranial vascular flow voids are maintained. Mild torcular inversion noted. Skull and upper cervical spine: Craniocervical junction within normal limits. Bone marrow signal intensity normal. No scalp soft tissue abnormality. Sinuses/Orbits: Prior bilateral ocular lens replacement. Paranasal sinuses are largely clear. No significant mastoid effusion. Other: None. IMPRESSION: 1. No acute intracranial abnormality. 2. Mild chronic microvascular ischemic disease for age. Electronically Signed   By: Rise Mu M.D.   On: 09/03/2022 01:52   ECHOCARDIOGRAM LIMITED  Result Date: 09/02/2022    ECHOCARDIOGRAM LIMITED REPORT   Patient Name:   Sydney Gillespie Date of Exam: 09/01/2022 Medical Rec #:  [id number]          Height:       65.0 in Accession #:    [id number]         Weight:       121.3 lb Date of Birth:  [birth year]           BSA:          1.599 m Patient Age:    82 years           BP:           97/48 mmHg Patient Gender: F                   HR:           64 bpm. Exam Location:  [facility] Procedure: 2D Echo, Cardiac Doppler, Limited Echo and Limited Color Doppler Indications:     R55 Syncope  History:         Patient has prior history of Echocardiogram examinations, most                  recent 08/09/2022. Arrythmias:Palpitations; Risk                  Factors:Dyslipidemia.  Sonographer:     Daphine Deutscher RDCS Referring Phys:  [id number] Verdene Lennert Diagnosing Phys: Alwyn Pea MD IMPRESSIONS  1. Left ventricular ejection fraction, by estimation, is 50 to 55%. The left ventricle has low normal function. The left ventricle demonstrates regional wall motion abnormalities (see scoring diagram/findings for description). Left ventricular diastolic  parameters were normal.  2. Right ventricular systolic function is normal. The right ventricular size is normal.  3. Left atrial size was mildly dilated.  4. A small pericardial effusion is present.  5. The mitral valve is normal in structure. Trivial mitral valve regurgitation.  6. The aortic valve is calcified. Aortic valve regurgitation is mild to moderate. Aortic valve sclerosis/calcification is present, without any evidence of aortic stenosis. FINDINGS  Left Ventricle: Left ventricular ejection fraction, by estimation, is 50 to 55%. The left ventricle has low normal function. The left ventricle demonstrates regional wall motion abnormalities. The left ventricular internal cavity size was normal in size. There is no left ventricular hypertrophy. Left ventricular diastolic parameters were normal. Right Ventricle: The right ventricular size is normal. No increase in right ventricular wall thickness. Right ventricular systolic function is normal. Left Atrium: Left atrial size was mildly dilated. Right Atrium: Right atrial size was not assessed. Pericardium: A small pericardial effusion is present. Mitral Valve: The mitral valve is normal in structure. Trivial mitral valve regurgitation. Tricuspid Valve:  The tricuspid valve is normal in structure. Tricuspid valve regurgitation is mild. Aortic Valve: The aortic valve is calcified. Aortic valve regurgitation is  mild to moderate. Aortic regurgitation PHT measures 572 msec. Aortic valve sclerosis/calcification is present, without any evidence of aortic stenosis. Pulmonic Valve: The pulmonic valve was normal in structure. Pulmonic valve regurgitation is not visualized. Aorta: The ascending aorta was not well visualized. IAS/Shunts: The interatrial septum was not well visualized. Additional Comments: There is no pleural effusion.  LEFT VENTRICLE PLAX 2D LVIDd:         4.10 cm Diastology LVIDs:         3.00 cm LV e' medial:    6.14 cm/s LV PW:         1.00 cm LV E/e' medial:  10.7 LV IVS:        1.00 cm LV e' lateral:   8.22 cm/s                        LV E/e' lateral: 8.0  RIGHT VENTRICLE             IVC RV S prime:     13.40 cm/s  IVC diam: 1.10 cm TAPSE (M-mode): 1.8 cm LEFT ATRIUM         Index LA diam:    4.60 cm 2.88 cm/m  AORTIC VALVE AI PHT:      572 msec  AORTA Ao Root diam: 3.40 cm MITRAL VALVE               TRICUSPID VALVE MV Area (PHT): 2.95 cm    TR Peak grad:   14.7 mmHg MV Decel Time: 257 msec    TR Vmax:        192.00 cm/s MV E velocity: 65.50 cm/s MV A velocity: 90.70 cm/s MV E/A ratio:  0.72 Dwayne D Callwood MD Electronically signed by Alwyn Pea MD Signature Date/Time: 09/02/2022/7:27:49 AM    Final    DG Chest Port 1 View  Result Date: 09/02/2022 CLINICAL DATA:  PICC placement EXAM: PORTABLE CHEST 1 VIEW COMPARISON:  Yesterday FINDINGS: Right upper extremity PICC with tip at the SVC. Cardiomegaly. Streaky opacity in the bilateral lungs. No visible effusion or pneumothorax. Chronic cardiomegaly. Multilevel cement augmentation in the thoracic spine. Gas distended upper abdominal colon. IMPRESSION: 1. Right upper extremity PICC with tip at the SVC. 2. Cardiomegaly and borderline vascular congestion. Atelectatic type opacity in the bilateral  lungs. Electronically Signed   By: Tiburcio Pea M.D.   On: 09/02/2022 04:34   US RENAL  Result Date: 09/01/2022 CLINICAL DATA:  [id number] AKI (acute kidney injury) [id number] EXAM: RENAL / URINARY TRACT ULTRASOUND COMPLETE COMPARISON:  11/04/2017 FINDINGS: Right Kidney: Renal measurements: 9.6 x 4.5 x 4.6 cm = volume: 102 mL. Parenchyma echogenic compared to adjacent liver. No focal lesion or hydronephrosis. Left Kidney: Renal measurements: 8.2 x 4.2 x 3.7 cm = volume: 67 mL. No hydronephrosis. Exophytic 3.1 x 2.7 x 2.9 cm cyst from lower pole as before. Bladder: Incompletely distended.  Bilateral ureteral jets demonstrated. Other: Bilateral pleural effusions incidentally noted. IMPRESSION: 1. No hydronephrosis. 2. Echogenic renal parenchyma suggesting medical renal disease. 3. 3.1 cm left renal cyst. 4. Bilateral pleural effusions. Electronically Signed   By: Corlis Leak M.D.   On: 09/01/2022 18:35   DG Chest Portable 1 View  Result Date: 09/01/2022 CLINICAL DATA:  Recent pneumonia EXAM: PORTABLE CHEST 1 VIEW COMPARISON:  08/07/2022 FINDINGS: Previously noted bibasilar and right lung opacities are improved, with some residual opacities in the mid to lower right lung and lower left lung, which are somewhat linear  and may reflect atelectasis or infection. Unchanged cardiac and mediastinal contours. Aortic atherosclerosis. No pleural effusion or pneumothorax. No acute osseous abnormality. Prior ACDF. IMPRESSION: Previously noted bibasilar and right lung opacities are improved, with some residual opacities in the mid to lower right lung and lower left lung, which may reflect atelectasis and/or infection. Electronically Signed   By: Wiliam Ke M.D.   On: 09/01/2022 15:26   CT HEAD WO CONTRAST ( )  Result Date: 09/01/2022 CLINICAL DATA:  Trauma EXAM: CT HEAD WITHOUT CONTRAST TECHNIQUE: Contiguous axial images were obtained from the base of the skull through the vertex without intravenous contrast. RADIATION  DOSE REDUCTION: This exam was performed according to the departmental dose-optimization program which includes automated exposure control, adjustment of the mA and/or kV according to patient size and/or use of iterative reconstruction technique. COMPARISON:  None Available. FINDINGS: Brain: No evidence of acute infarction, hemorrhage, hydrocephalus, extra-axial collection or mass lesion/mass effect. Sequela of moderate chronic microvascular ischemic change. Vascular: No hyperdense vessel or unexpected calcification. Skull: Normal. Negative for fracture or focal lesion. Sinuses/Orbits: No mastoid or middle ear effusion. Paranasal sinuses are notable for frothy secretions in the bilateral sphenoid sinuses, which can be seen in the setting sinusitis. Bilateral lens replacement. Orbits are otherwise unremarkable. Other: None. IMPRESSION: 1. No acute intracranial abnormality. 2. Frothy secretions in the bilateral sphenoid sinuses can be seen in the setting of acute sinusitis. Electronically Signed   By: Lorenza Cambridge M.D.   On: 09/01/2022 14:36    Microbiology: Results for orders placed or performed during the hospital encounter of 09/17/22  Blood culture (routine x 2)     Status: None   Collection Time: 09/17/22  5:34 AM   Specimen: BLOOD  Result Value Ref Range Status   Specimen Description BLOOD  RIGHT ARM  Final   Special Requests   Final    BOTTLES DRAWN AEROBIC AND ANAEROBIC Blood Culture adequate volume   Culture   Final    NO GROWTH 5 DAYS Performed at [facility], [address], [place], [place] [address]    Report Status 09/22/2022 FINAL  Final  Blood culture (routine x 2)     Status: None   Collection Time: 09/17/22  5:34 AM   Specimen: BLOOD  Result Value Ref Range Status   Specimen Description BLOOD  RIGHT HAND  Final   Special Requests   Final    BOTTLES DRAWN AEROBIC AND ANAEROBIC Blood Culture results may not be optimal due to an inadequate volume of blood received in  culture bottles   Culture   Final    NO GROWTH 5 DAYS Performed at [facility], [address] [place], [place] [address]    Report Status 09/22/2022 FINAL  Final  SARS Coronavirus 2 by RT PCR (hospital order, performed in [facility] hospital lab) *cepheid single result test* Anterior Nasal Swab     Status: None   Collection Time: 09/17/22  5:42 AM   Specimen: Anterior Nasal Swab  Result Value Ref Range Status   SARS Coronavirus 2 by RT PCR NEGATIVE NEGATIVE Final    Comment: (NOTE) SARS-CoV-2 target nucleic acids are NOT DETECTED.  The SARS-CoV-2 RNA is generally detectable in upper and lower respiratory specimens during the acute phase of infection. The lowest concentration of SARS-CoV-2 viral copies this assay can detect is 250 copies / mL. A negative result does not preclude SARS-CoV-2 infection and should not be used as the sole basis for treatment or other  patient management decisions.  A negative result may occur with improper specimen collection / handling, submission of specimen other than nasopharyngeal swab, presence of viral mutation(s) within the areas targeted by this assay, and inadequate number of viral copies (<250 copies / mL). A negative result must be combined with clinical observations, patient history, and epidemiological information.  Fact Sheet for Patients:   RoadLapTop.co.za  Fact Sheet for Healthcare Providers: http://kim-miller.com/  This test is not yet approved or  cleared by the [place] FDA and has been authorized for detection and/or diagnosis of SARS-CoV-2 by FDA under an Emergency Use Authorization (EUA).  This EUA will remain in effect (meaning this test can be used) for the duration of the COVID-19 declaration under Section 564(b)(1) of the Act, 21 U.S.C. section 360bbb-3(b)(1), unless the authorization is terminated or revoked sooner.  Performed at [facility], [address] [place], [place] [address]   MRSA Next Gen by PCR, Nasal     Status: None   Collection Time: 09/17/22  4:56 PM   Specimen: Nasal Mucosa; Nasal Swab  Result Value Ref Range Status   MRSA by PCR Next Gen NOT DETECTED NOT DETECTED Final    Comment: (NOTE) The GeneXpert MRSA Assay (FDA approved for NASAL specimens only), is one component of a comprehensive MRSA colonization surveillance program. It is not intended to diagnose MRSA infection nor to guide or monitor treatment for MRSA infections. Test performance is not FDA approved in patients less than 69 years old. Performed at [facility], [address] [place], [place] [address]     Labs: CBC: Recent Labs  Lab 09/19/22 0930 09/20/22 0730 09/21/22 0500 09/22/22 0601 09/23/22 0520  WBC 8.3 7.9 9.8 7.0 6.4  NEUTROABS 6.1 5.9 7.6 5.0 4.4  HGB 7.2* 8.5* 9.2* 8.6* 8.1*  HCT 22.9* 26.4* 29.1* 27.0* 26.1*  MCV 95.8 94.3 95.7 95.4 96.3  PLT 209 238 269 268 242   Basic Metabolic Panel: Recent Labs  Lab 09/19/22 0845 09/20/22 0730 09/21/22 0500 09/22/22 0601 09/23/22 0520  NA 141 140 142 140 144  K 3.8 4.0 3.5 4.1 3.4*  CL 107 105 106 109 109  CO2 24 23 23 24 27   GLUCOSE 79 71 59* 88 85  BUN 24* 33* 33* 31* 27*  CREATININE 1.21* 1.38* 1.39* 1.30* 1.23*  CALCIUM 8.1* 8.6* 8.5* 8.5* 8.8*  MG 1.7 2.4 2.3 2.1 1.8  PHOS 2.7 3.2 3.8 2.6 3.1   Liver Function Tests: Recent Labs  Lab 09/19/22 0845 09/20/22 0730 09/21/22 0500 09/22/22 0601 09/23/22 0520  AST 117* 102* 68* 54* 46*  ALT 8 5 <5 <5 5  ALKPHOS 231* 199* 185* 149* 130*  BILITOT 0.8 1.0 1.1 1.2 0.5  PROT 5.8* 5.7* 6.0* 5.5* 5.3*  ALBUMIN 3.0* 3.0* 3.0* 2.7* 2.7*   CBG: No results for input(s): "GLUCAP" in the last 168 hours.  Discharge time spent: approximately 35 minutes spent on discharge counseling, evaluation of patient on day of discharge, and coordination of discharge planning with nursing, social work, pharmacy and case  management  Signed: Alberteen Sam, MD [facility] 09/23/2022

## 2022-09-23 NOTE — Progress Notes (Signed)
Called report to [employer], spoke with the receiving nurse Fleet Contras.

## 2022-09-24 LAB — TYPE AND SCREEN
Antibody Screen: POSITIVE
Unit division: 0
Unit division: 0

## 2022-09-24 LAB — BPAM RBC
Blood Product Expiration Date: 202405282359
ISSUE DATE / TIME: 202405112232
Unit Type and Rh: 9500
Unit Type and Rh: 9500

## 2022-09-30 ENCOUNTER — Inpatient Hospital Stay: Payer: Medicare Other

## 2022-09-30 ENCOUNTER — Inpatient Hospital Stay: Payer: Medicare Other | Attending: Internal Medicine

## 2022-09-30 ENCOUNTER — Inpatient Hospital Stay: Payer: Medicare Other | Admitting: Internal Medicine

## 2022-10-10 DEATH — deceased

## 2022-10-14 ENCOUNTER — Other Ambulatory Visit: Payer: Self-pay | Admitting: Internal Medicine

## 2022-10-14 ENCOUNTER — Inpatient Hospital Stay: Payer: Medicare Other | Admitting: Internal Medicine

## 2022-10-14 ENCOUNTER — Encounter: Payer: Self-pay | Admitting: Internal Medicine

## 2022-10-14 ENCOUNTER — Inpatient Hospital Stay: Payer: Medicare Other

## 2022-10-14 NOTE — Assessment & Plan Note (Deleted)
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  Kappa lambda light chain ratio-rising; Lamda light chain- 329.   Currently on daratumumab subcu with plan to add revlimid with cycle #2. Dara cycle 1; day 1 on 12/07; currently on HOLD-see below  # Continue to HOLD DARA cycle  #1-day 8 today. Labs today reviewed;  given acute issues below [recent COVID/pneumonia/declining performance status/vertebral compression fractures/pneumonia/vertebral osteomyelitis] FEB 2024- M proetin 0.3; Kappa=61[OCT 2023- BL-0.7;  BL- 329]; reviewed the myeloma panel is better just as 1 treatment.  Recommend holding any therapy at this time-given acute issues see below [pneumonia; osteomyelitis vertebral disc].  Will repeat myeloma panel in 1 month.   # Thoracic discitis/vertebral osteomyelitis- [s/p ID evaluation]- s/p PICC line-antibiotic until May 9th.  Monitor closely.  Hold off any chemotherapy at this time.  # Discussed the role of Zometa to decrease skeletal related events [pain; fractures; need for radiation; hypercalcemia].  HOLD zometa for now.  #  DEC 29th, 23rd -Nonocclusive thrombus in one of the posterior tibial veins of the right calf- continue eliquis for now.    # Compression fractures- [Dr.Jenkins; [place]] rib fractures- on hydrocodone 1 pill q 8 hrs prn; Tyelenol prn; Tinazidine BID.  On Zometa; hold for now.  See above.   # Dysphagia/weight loss- -Status post EGD/ [FEB 2024][[facility]]-abnormal motility noted; ?  Distal esophagus spasm noted question related to opioids versus others.  Recommend manometric studies; awaiting coming off narcotics.   On zyprexa 5 mg/day- stable.   # CKD stage III- IVcreatinine-creatinine GFR-32-[Dr.Singh] stable;  continue vit D  # RA- [on Plaquenil]- stable;   # Anxiety/nausea- prescribed/refilled  xanax 0.25 mg qhs; added zoloft-continue Zofran as needed; continue  zyprexa to 10 mg/day. stable;   # Vaccination:  flu shot s/p;  Covid booster; RSV- discussed   #DISPOSITION:  # HOLD Zometa # follow up in 6 week-  MD;  labs-cbc/cmp;MM panel; K/l light chains;  zometa-- Dr.B  Cc; Dr.Hande/ Dr.Singh 

## 2022-10-14 NOTE — Progress Notes (Signed)
Spoke with the granddaughter and offered condolences.  Family thankful for the care.  Hb

## 2022-10-14 NOTE — Progress Notes (Deleted)
[facility] OFFICE PROGRESS NOTE  Patient Care Team: Barbette Reichmann, MD as PCP - General (Internal Medicine) Earna Coder, MD as Consulting Physician (Hematology and Oncology)  SUMMARY OF ONCOLOGIC HISTORY:  Oncology History Overview Note  # 2012- MULTIPLE MYELOMA IgG Kappa Light chain FISH del13; s/p AUTO- BMT [MAY 2013; Dr.Gabriel; [facility]] AUG 2017- M-PROTEIN NEG; K/L= 1.49/N; NO MAINTENANCE THERAPY  # OCT 2021- RELAPSED;  BMBx- plasma cells arranged in small clusters and aggregates [10% of total marrow cellularity]  Overall, the features are worrisome for early relapse of the patient's previously diagnosed plasma cell neoplasm.  No evidence of organ dysfunction; continue close surveillance.  # NOV 2023- NOV 2023- REPEAT BONE MARROW-plasma cells cell 10-20%.  Kappa lambda light chain ratio-rising; Lamda light chain- 329. NOV 2023-skeletal survey no lytic lesions.  Worsening renal insufficiency; GFR 20s; and also worsening anemia hemoglobin 9.8; iron saturation 20%.   # DEC 7th, 2023- DARA SQ+ Dex 20 mg; REV- hold cycle #1.   # CKD [~ creat 1.35- 1.7] ; Dr.Singh  # 2012- LEFT KIDNEY ? 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 benign   Multiple myeloma not having achieved remission (HCC)  12/18/2015 Initial Diagnosis   Multiple myeloma in remission (HCC)   04/16/2022 -  Chemotherapy   Patient is on Treatment Plan : MYELOMA RELAPSED REFRACTORY Daratumumab SQ + Lenalidomide + Dexamethasone (DaraRd) q28d      INTERVAL HISTORY: Ambulating in a wheel chair.  Accompanied by her granddaughter.   This is a pleasant 82  year-old female patient with above history of relapsed multiple Myeloma [DEC 2023]-currently s/p daratumumab SQ treatments is here for follow-up.  Patient received daratumumab cycle number 1 day about  on 12/07. Patient has not started Revlimid and has not received yet.    However, therapy currently on hold because of admission for  COVID-pneumonia/deconditioning; vertebral compression fractures/difficulty swallowing; again recent admission for vertebral discitis/osteomyelitis.  Patient currently s/p evaluation with ID; and also on IV antibiotics/PICC line until May 9th, 2024.  ..Marland Kitchen  Patient appetite is improving.  Patient has noted to have improvement of her back pain.  Not taking any round-the-clock pain medication.  Complains of anxiety.  She is on Xanax 2.5 mg at nighttime.  Starting Cymbalta 20 mg twice daily.  Review of Systems  Constitutional:  Positive for malaise/fatigue. Negative for diaphoresis and weight loss.  HENT:  Negative for nosebleeds and sore throat.   Eyes:  Negative for double vision.  Respiratory:  Positive for shortness of breath. Negative for hemoptysis and wheezing.   Cardiovascular:  Negative for chest pain, palpitations, orthopnea and leg swelling.  Gastrointestinal:  Positive for nausea. Negative for abdominal pain, blood in stool, constipation, diarrhea, heartburn, melena and vomiting.  Genitourinary:  Negative for dysuria, frequency and urgency.  Musculoskeletal:  Positive for back pain, joint pain and myalgias.  Skin: Negative.  Negative for itching and rash.  Neurological:  Positive for tingling. Negative for dizziness, focal weakness, weakness and headaches.  Endo/Heme/Allergies:  Does not bruise/bleed easily.  Psychiatric/Behavioral:  Negative for depression. The patient is not nervous/anxious and does not have insomnia.     ALLERGIES:  is allergic to isoniazid.  MEDICATIONS:  Current Outpatient Medications  Medication Sig Dispense Refill   acetaminophen (TYLENOL) 325 MG tablet Take 2 tablets (650 mg total) by mouth every 6 (six) hours as needed for mild pain (or Fever >/= 101).     allopurinol (ZYLOPRIM) 300 MG tablet Take  300 mg by mouth daily.     ALPRAZolam (XANAX) 0.25 MG tablet Take 1 tablet (0.25 mg total) by mouth 2 (two) times daily as needed. Once daily for anxiety or  sleep 10 tablet 0   Ascorbic Acid (VITAMIN C) 1000 MG tablet Take 1,000 mg by mouth daily.     atorvastatin (LIPITOR) 40 MG tablet Take 1 tablet (40 mg total) by mouth daily. 30 tablet 0   CALCIUM MAGNESIUM ZINC PO Take 1 tablet by mouth 2 (two) times daily. 1 am, 2 hs     cefadroxil (DURICEF) 500 MG capsule Take 1 capsule (500 mg total) by mouth 2 (two) times daily. 24 capsule 0   Cholecalciferol (VITAMIN D3) 125 MCG (5000 UT) TABS Take 1 tablet by mouth daily.     docusate sodium (COLACE) 100 MG capsule Take 1 capsule (100 mg total) by mouth 2 (two) times daily. 30 capsule 0   ELIQUIS 5 MG TABS tablet TAKE 1 TABLET(5 MG) BY MOUTH TWICE DAILY (Patient taking differently: Take 5 mg by mouth 2 (two) times daily.) 60 tablet 1   feeding supplement (ENSURE ENLIVE / ENSURE PLUS) LIQD Take 237 mLs by mouth 3 (three) times daily between meals. 21330 mL 0   Heparin Sod, Pork, Lock Flush (HEPARIN LOCK FLUSH IV) Inject 5 mLs into the vein 2 (two) times daily.     hydroxychloroquine (PLAQUENIL) 200 MG tablet Take 200 mg by mouth See admin instructions. Patient currently takes 200 mg twice daily Monday through Friday and 200 mg once daily Saturday and Sunday.     Ipratropium-Albuterol (COMBIVENT) 20-100 MCG/ACT AERS respimat Inhale 1 puff into the lungs every 6 (six) hours as needed for wheezing. 4 g 0   iron polysaccharides (NIFEREX) 150 MG capsule Take 1 capsule (150 mg total) by mouth daily. 30 capsule 1   levothyroxine (SYNTHROID) 137 MCG tablet Take 1 tablet (137 mcg total) by mouth daily at 6 (six) AM. 30 tablet 0   losartan (COZAAR) 25 MG tablet Take 0.5 tablets (12.5 mg total) by mouth daily. 30 tablet 0   metoprolol succinate (TOPROL-XL) 25 MG 24 hr tablet Take 3 tablets (75 mg total) by mouth daily. 30 tablet 0   midodrine (PROAMATINE) 5 MG tablet Take 1 tablet (5 mg total) by mouth 3 (three) times daily with meals. 90 tablet 0   Multiple Vitamin (MULTIVITAMIN WITH MINERALS) TABS tablet Take 1 tablet  by mouth daily. 30 tablet 0   nystatin-triamcinolone (MYCOLOG II) cream Apply 1 Application topically 2 (two) times daily as needed (itching).     OLANZapine (ZYPREXA) 10 MG tablet Take 1 tablet (10 mg total) by mouth at bedtime. 30 tablet 0   pantoprazole (PROTONIX) 40 MG tablet Take 1 tablet (40 mg total) by mouth daily. 30 tablet 0   polyethylene glycol (MIRALAX / GLYCOLAX) 17 g packet Take 17 g by mouth daily. 30 each 0   Potassium 99 MG TABS Take 1 tablet by mouth daily.     rOPINIRole (REQUIP) 0.25 MG tablet Take 1 tablet (0.25 mg total) by mouth at bedtime. (Patient taking differently: Take 0.5 mg by mouth at bedtime.) 30 tablet 0   senna-docusate (SENOKOT-S) 8.6-50 MG tablet Take 1 tablet by mouth 2 (two) times daily. Hold if having loose or frequent stools. 30 tablet 0   sertraline (ZOLOFT) 50 MG tablet Take 1 tablet (50 mg total) by mouth daily. 30 tablet 3   Spacer/Aero-Holding Chambers DEVI 1 each by Does not apply route  as needed. 1 each 0   torsemide (DEMADEX) 10 MG tablet Take 1 tablet (10 mg total) by mouth daily. 30 tablet 0   vitamin B-12 (CYANOCOBALAMIN) 1000 MCG tablet Take 1,000 mcg by mouth daily.     zinc gluconate 50 MG tablet Take 50 mg by mouth daily.     No current facility-administered medications for this visit.    PHYSICAL EXAMINATION: ECOG PERFORMANCE STATUS: 0 - Asymptomatic  There were no vitals taken for this visit.  There were no vitals filed for this visit.    Patient appears weak.   Physical Exam HENT:     Head: Normocephalic and atraumatic.     Mouth/Throat:     Pharynx: No oropharyngeal exudate.  Eyes:     Pupils: Pupils are equal, round, and reactive to light.  Cardiovascular:     Rate and Rhythm: Normal rate and regular rhythm.  Pulmonary:     Effort: No respiratory distress.     Breath sounds: No wheezing.  Abdominal:     General: Bowel sounds are normal. There is no distension.     Palpations: Abdomen is soft. There is no mass.      Tenderness: There is no abdominal tenderness. There is no guarding or rebound.  Musculoskeletal:        General: No tenderness. Normal range of motion.     Cervical back: Normal range of motion and neck supple.  Skin:    General: Skin is warm.  Neurological:     Mental Status: She is alert and oriented to person, place, and time.  Psychiatric:        Mood and Affect: Affect normal.     LABORATORY DATA:  I have reviewed the data as listed    Component Value Date/Time   NA 144 09/23/2022 0520   NA 142 08/16/2013 1028   K 3.4 (L) 09/23/2022 0520   K 4.5 08/16/2013 1028   CL 109 09/23/2022 0520   CL 106 08/16/2013 1028   CO2 27 09/23/2022 0520   CO2 27 08/16/2013 1028   GLUCOSE 85 09/23/2022 0520   GLUCOSE 99 08/16/2013 1028   BUN 27 (H) 09/23/2022 0520   BUN 21 (H) 08/16/2013 1028   CREATININE 1.23 (H) 09/23/2022 0520   CREATININE 1.30 (H) 08/17/2022 1031   CREATININE 1.42 (H) 09/07/2014 0954   CALCIUM 8.8 (L) 09/23/2022 0520   CALCIUM 9.3 09/07/2014 0954   PROT 5.3 (L) 09/23/2022 0520   PROT 7.4 08/16/2013 1028   ALBUMIN 2.7 (L) 09/23/2022 0520   ALBUMIN 3.9 08/16/2013 1028   AST 46 (H) 09/23/2022 0520   AST 31 08/17/2022 1031   ALT 5 09/23/2022 0520   ALT <5 08/17/2022 1031   ALT 15 08/16/2013 1028   ALKPHOS 130 (H) 09/23/2022 0520   ALKPHOS 61 08/16/2013 1028   BILITOT 0.5 09/23/2022 0520   BILITOT 0.5 08/17/2022 1031   GFRNONAA 44 (L) 09/23/2022 0520   GFRNONAA 41 (L) 08/17/2022 1031   GFRNONAA 36 (L) 09/07/2014 0954   GFRAA 19 (L) 02/09/2020 1001   GFRAA 42 (L) 09/07/2014 0954    No results found for: "SPEP", "UPEP"  Lab Results  Component Value Date   WBC 6.4 09/23/2022   NEUTROABS 4.4 09/23/2022   HGB 8.1 (L) 09/23/2022   HCT 26.1 (L) 09/23/2022   MCV 96.3 09/23/2022   PLT 242 09/23/2022      Chemistry      Component Value Date/Time   NA 144 09/23/2022 0520  NA 142 08/16/2013 1028   K 3.4 (L) 09/23/2022 0520   K 4.5 08/16/2013 1028   CL  109 09/23/2022 0520   CL 106 08/16/2013 1028   CO2 27 09/23/2022 0520   CO2 27 08/16/2013 1028   BUN 27 (H) 09/23/2022 0520   BUN 21 (H) 08/16/2013 1028   CREATININE 1.23 (H) 09/23/2022 0520   CREATININE 1.30 (H) 08/17/2022 1031   CREATININE 1.42 (H) 09/07/2014 0954      Component Value Date/Time   CALCIUM 8.8 (L) 09/23/2022 0520   CALCIUM 9.3 09/07/2014 0954   ALKPHOS 130 (H) 09/23/2022 0520   ALKPHOS 61 08/16/2013 1028   AST 46 (H) 09/23/2022 0520   AST 31 08/17/2022 1031   ALT 5 09/23/2022 0520   ALT <5 08/17/2022 1031   ALT 15 08/16/2013 1028   BILITOT 0.5 09/23/2022 0520   BILITOT 0.5 08/17/2022 1031       RADIOGRAPHIC STUDIES: I have personally reviewed the radiological images as listed and agreed with the findings in the report. No results found.   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  Kappa lambda light chain ratio-rising; Lamda light chain- 329.   Currently on daratumumab subcu with plan to add revlimid with cycle #2. Dara cycle 1; day 1 on 12/07; currently on HOLD-see below  # Continue to HOLD DARA cycle  #1-day 8 today. Labs today reviewed;  given acute issues below [recent COVID/pneumonia/declining performance status/vertebral compression fractures/pneumonia/vertebral osteomyelitis] FEB 2024- M proetin 0.3; Kappa=61[OCT 2023- BL-0.7;  BL- 329]; reviewed the myeloma panel is better just as 1 treatment.  Recommend holding any therapy at this time-given acute issues see below [pneumonia; osteomyelitis vertebral disc].  Will repeat myeloma panel in 1 month.   # Thoracic discitis/vertebral osteomyelitis- [s/p ID evaluation]- s/p PICC line-antibiotic until May 9th.  Monitor closely.  Hold off any chemotherapy at this time.  # Discussed the role of Zometa to decrease  skeletal related events [pain; fractures; need for radiation; hypercalcemia].  HOLD zometa for now.  #  DEC 29th, 23rd -Nonocclusive thrombus in one of the posterior tibial veins of the right calf- continue eliquis for now.    # Compression fractures- [Dr.Jenkins; [place]] rib fractures- on hydrocodone 1 pill q 8 hrs prn; Tyelenol prn; Tinazidine BID.  On Zometa; hold for now.  See above.   # Dysphagia/weight loss- -Status post EGD/ [FEB 2024][[facility]]-abnormal motility noted; ?  Distal esophagus spasm noted question related to opioids versus others.  Recommend manometric studies; awaiting coming off narcotics.   On zyprexa 5 mg/day- stable.   # CKD stage III- IVcreatinine-creatinine GFR-32-[Dr.Singh] stable;  continue vit D  # RA- [on Plaquenil]- stable;   # Anxiety/nausea- prescribed/refilled  xanax 0.25 mg qhs; added zoloft-continue Zofran as needed; continue zyprexa to 10 mg/day. stable;   # Vaccination:  flu shot s/p;  Covid booster; RSV- discussed   #DISPOSITION:  # HOLD Zometa # follow up in 6 week-  MD;  labs-cbc/cmp;MM panel; K/l light chains;  zometa-- Dr.B  Cc; Dr.Hande/ Dr.Singh      Earna Coder, MD 10/14/2022 9:29 AM

## 2023-01-18 ENCOUNTER — Ambulatory Visit: Payer: Medicare Other | Admitting: Urology

## 2023-01-19 ENCOUNTER — Encounter: Payer: Self-pay | Admitting: Urology
